# Patient Record
Sex: Female | Born: 1940 | Race: White | Hispanic: No | Smoking: Never smoker
Health system: Southern US, Community
[De-identification: ages and names within clinical notes are randomized; demographics above are authoritative.]

## PROBLEM LIST (undated history)

## (undated) DIAGNOSIS — Z9889 Other specified postprocedural states: Secondary | ICD-10-CM

## (undated) DIAGNOSIS — R52 Pain, unspecified: Secondary | ICD-10-CM

## (undated) DIAGNOSIS — M5417 Radiculopathy, lumbosacral region: Secondary | ICD-10-CM

## (undated) DIAGNOSIS — E039 Hypothyroidism, unspecified: Secondary | ICD-10-CM

## (undated) DIAGNOSIS — E119 Type 2 diabetes mellitus without complications: Secondary | ICD-10-CM

## (undated) DIAGNOSIS — C562 Malignant neoplasm of left ovary: Secondary | ICD-10-CM

## (undated) DIAGNOSIS — I10 Essential (primary) hypertension: Secondary | ICD-10-CM

## (undated) DIAGNOSIS — L509 Urticaria, unspecified: Secondary | ICD-10-CM

## (undated) DIAGNOSIS — M069 Rheumatoid arthritis, unspecified: Secondary | ICD-10-CM

## (undated) DIAGNOSIS — M1990 Unspecified osteoarthritis, unspecified site: Secondary | ICD-10-CM

## (undated) DIAGNOSIS — C569 Malignant neoplasm of unspecified ovary: Secondary | ICD-10-CM

## (undated) DIAGNOSIS — E538 Deficiency of other specified B group vitamins: Secondary | ICD-10-CM

## (undated) DIAGNOSIS — K219 Gastro-esophageal reflux disease without esophagitis: Secondary | ICD-10-CM

## (undated) DIAGNOSIS — J189 Pneumonia, unspecified organism: Secondary | ICD-10-CM

## (undated) DIAGNOSIS — R112 Nausea with vomiting, unspecified: Secondary | ICD-10-CM

## (undated) HISTORY — DX: Malignant neoplasm of unspecified ovary: C56.9

## (undated) HISTORY — DX: Urticaria, unspecified: L50.9

## (undated) HISTORY — PX: EYE SURGERY: SHX253

## (undated) HISTORY — PX: BREAST SURGERY: SHX581

## (undated) HISTORY — PX: KNEE ARTHROSCOPY: SUR90

## (undated) HISTORY — DX: Radiculopathy, lumbosacral region: M54.17

## (undated) HISTORY — DX: Malignant neoplasm of left ovary: C56.2

## (undated) HISTORY — DX: Pain, unspecified: R52

---

## 1982-10-16 HISTORY — PX: VAGINAL HYSTERECTOMY: SHX2639

## 1986-10-16 HISTORY — PX: CHOLECYSTECTOMY: SHX55

## 2000-10-16 HISTORY — PX: BLADDER SUSPENSION: SHX72

## 2003-10-17 HISTORY — PX: ROTATOR CUFF REPAIR: SHX139

## 2004-08-17 ENCOUNTER — Observation Stay (HOSPITAL_COMMUNITY): Admission: RE | Admit: 2004-08-17 | Discharge: 2004-08-18 | Payer: Self-pay | Admitting: Orthopedic Surgery

## 2008-06-23 ENCOUNTER — Ambulatory Visit (HOSPITAL_BASED_OUTPATIENT_CLINIC_OR_DEPARTMENT_OTHER): Admission: RE | Admit: 2008-06-23 | Discharge: 2008-06-23 | Payer: Self-pay | Admitting: Orthopedic Surgery

## 2008-10-16 HISTORY — PX: PARATHYROIDECTOMY: SHX19

## 2009-07-02 ENCOUNTER — Ambulatory Visit (HOSPITAL_BASED_OUTPATIENT_CLINIC_OR_DEPARTMENT_OTHER): Admission: RE | Admit: 2009-07-02 | Discharge: 2009-07-02 | Payer: Self-pay | Admitting: Orthopedic Surgery

## 2011-01-20 LAB — BASIC METABOLIC PANEL
BUN: 17 mg/dL (ref 6–23)
CO2: 30 mEq/L (ref 19–32)
Calcium: 9.5 mg/dL (ref 8.4–10.5)
Chloride: 104 mEq/L (ref 96–112)
Creatinine, Ser: 0.59 mg/dL (ref 0.4–1.2)
GFR calc Af Amer: >60 mL/min (ref >60)
GFR calc non Af Amer: >60 mL/min (ref >60)
Glucose, Bld: 123 mg/dL — ABNORMAL HIGH (ref 70–99)
Potassium: 4.7 mEq/L (ref 3.5–5.1)
Sodium: 141 mEq/L (ref 135–145)

## 2011-01-20 LAB — GLUCOSE, CAPILLARY
Glucose-Capillary: 100 mg/dL — ABNORMAL HIGH (ref 70–99)
Glucose-Capillary: 126 mg/dL — ABNORMAL HIGH (ref 70–99)

## 2011-01-20 LAB — POCT HEMOGLOBIN-HEMACUE: Hemoglobin: 14 g/dL (ref 12.0–15.0)

## 2011-02-28 NOTE — Op Note (Signed)
NAMEVikki Park, [name]                  ACCOUNT NO.:  [id number]   MEDICAL RECORD NO.:  [id number]          PATIENT TYPE:  AMB   LOCATION:  DSC                          FACILITY:  [facility]   PHYSICIAN:  Cindee Salt, M.D.       DATE OF BIRTH:  12-26-1940   DATE OF PROCEDURE:  06/23/2008  DATE OF DISCHARGE:                               OPERATIVE REPORT   PREOPERATIVE DIAGNOSIS:  Carpal tunnel syndrome, right hand.   POSTOPERATIVE DIAGNOSIS:  Carpal tunnel syndrome, right hand.   OPERATION:  Decompression, right median nerve.   SURGEON:  Cindee Salt, MD   ASSISTANT:  Joaquin Courts, RN   ANESTHESIA:  Forearm based IV regional.   ANESTHESIOLOGIST:  Guadalupe Maple, MD   HISTORY:  The patient is a 70 year old female with a history of carpal  tunnel syndrome, EMG nerve conduction is positive.  This has not  responded to conservative treatment.  She has elected to undergo  surgical decompression.  Pre, peri, and postoperative course was  discussed along with risks and complications.  She is aware that there  is no guarantee with the surgery; possibility of infection; recurrence  of injury to arteries, nerves, tendons, and incomplete relief of  symptoms; and dystrophy.  In the preoperative area, the patient is seen.  The extremity marked by both the patient and surgeon.  Antibiotic given.   PROCEDURE:  The patient was brought to the operating room where a  forearm based IV regional anesthetic was carried out without difficulty  under the direction of Dr. Noreene Larsson.  She was prepped using DuraPrep,  supine position.  A time-out was taken.  Following this, a longitudinal  incision was made in the right palm and carried down through  subcutaneous tissue.  Bleeders were electrocauterized.  Palmar fascia  was split.  Superficial palmar arch identified.  The flexor tendon to  the ring and little finger identified to the ulnar side of median nerve,  and the carpal retinaculum was incised with sharp  dissection.  A right-  angle and Sewall retractor were placed between the skin and forearm  fascia.  The fascia was released for approximately a centimeter and half  proximal to the wrist crease under direct vision.  Canal was explored.  The area of compression to the nerve was apparent.  No further lesions  were identified.  Tenosynovial tissue was moderately thickened.  The  wound was irrigated.  The skin was closed with interrupted 5-0 Vicryl  Rapide sutures.  A sterile compressive dressing and wrist splint was  applied.  The patient tolerated the procedure well, was taken to  recovery room for observation in satisfactory condition.  She will be  discharged to home to return to the [facility] [place] in 1 week,  on Vicodin.          ______________________________  Cindee Salt, M.D.    GK/MEDQ  D:  06/23/2008  T:  06/23/2008  Job:  [id number]

## 2011-03-03 NOTE — Op Note (Signed)
NAME:  Park, Amy                  ACCOUNT NO.:  [id number]   MEDICAL RECORD NO.:  [id number]          PATIENT TYPE:  AMB   LOCATION:  DAY                          FACILITY:  [facility]   PHYSICIAN:  Ronald A. Gioffre, M.D.DATE OF BIRTH:  1941-04-07   DATE OF PROCEDURE:  08/17/2004  DATE OF DISCHARGE:                                 OPERATIVE REPORT   SURGEON:  Georges Lynch. Darrelyn Hillock, M.D.   ASSISTANT:  Ebbie Ridge. Paitsel, P.A.   PREOPERATIVE DIAGNOSES:  1.  A tear of the rotator cuff tendon right shoulder.  2.  Severe impingement syndrome right shoulder.   POSTOPERATIVE DIAGNOSES:  1.  A tear of the rotator cuff tendon right shoulder.  2.  Severe impingement syndrome right shoulder.   OPERATIONS:  1.  Open decompression of the right shoulder by performing a partial      acromionectomy and acromioplasty.  2.  Repair of a small hole in the rotator cuff tendon with primary suture.   PROCEDURE:  Under general anesthesia, prior to the general anesthesia, the  patient had an interscalene nerve block on the right.  She was given 500 mg  of vancomycin IV preop.  Sterile prepping and draping of the right shoulder  was carried out.  A small incision was made over the anterior aspect of the  right shoulder.  Bleeders were identified and cauterized.  At this time, I  identified the deltoid muscle and deltoid tendon.  She had a markedly  thickened deltoid muscle proximally.  We incised a proximal portion of the  muscle and detached the tendon from the acromion in the usual fashion.  We  then did a subdeltoid bursectomy.  She had a markedly swollen inflamed  bursa.  We removed that.  We noted she had a small hole from the rotator  cuff.  The major problem that she had here was a large widening and  overgrowth of her acromion.  I protected the rotator cuff with the Bennett  retractor, utilized the oscillating saw, did a partial acromionectomy.  We  then utilized the bur to even the undersurface of the  acromion out.  We now  established a nice space for her as far as her subacromial space.  We then  went down and repaired the rotator cuff tendon with a few Ethibond sutures.  We thoroughly irrigated out the area, and then we inserted some  thrombin-soaked Gelfoam for hemostasis purposes, and then reapproximated the  deltoid tendon and muscle in the usual fashion.  The subcutaneous was closed  with 0 Vicryl, skin with metal staples.  A sterile Neosporin dressing was  applied.  She was placed in a shoulder immobilizer.      RAG/MEDQ  D:  08/17/2004  T:  08/17/2004  Job:  [id number]   cc:   Windy Fast A. Darrelyn Hillock, M.D.  [facility]  [address]  [address]  [place]  [place] [address]  Fax: [phone]

## 2011-07-19 LAB — BASIC METABOLIC PANEL
BUN: 9
CO2: 27
Calcium: 9.6
Chloride: 105
Creatinine, Ser: 0.53
GFR calc Af Amer: >60
GFR calc non Af Amer: >60
Glucose, Bld: 108 — ABNORMAL HIGH
Potassium: 4.3
Sodium: 138

## 2011-07-19 LAB — GLUCOSE, CAPILLARY
Glucose-Capillary: 135 — ABNORMAL HIGH
Glucose-Capillary: 147 — ABNORMAL HIGH

## 2011-07-19 LAB — POCT HEMOGLOBIN-HEMACUE: Hemoglobin: 12.2

## 2014-11-18 ENCOUNTER — Other Ambulatory Visit: Payer: Self-pay | Admitting: Surgical

## 2014-12-01 ENCOUNTER — Encounter (HOSPITAL_COMMUNITY): Payer: Self-pay

## 2014-12-01 ENCOUNTER — Other Ambulatory Visit: Payer: Self-pay

## 2014-12-01 ENCOUNTER — Other Ambulatory Visit (HOSPITAL_COMMUNITY): Payer: Self-pay | Admitting: *Deleted

## 2014-12-01 ENCOUNTER — Encounter (HOSPITAL_COMMUNITY)
Admission: RE | Admit: 2014-12-01 | Discharge: 2014-12-01 | Disposition: A | Discharge status: Home / Self Care | Payer: PPO | Source: Ambulatory Visit | Attending: Orthopedic Surgery | Admitting: Orthopedic Surgery

## 2014-12-01 ENCOUNTER — Ambulatory Visit (HOSPITAL_COMMUNITY)
Admission: RE | Admit: 2014-12-01 | Discharge: 2014-12-01 | Disposition: A | Discharge status: Home / Self Care | Payer: PPO | Source: Ambulatory Visit | Attending: Surgical | Admitting: Surgical

## 2014-12-01 DIAGNOSIS — J9811 Atelectasis: Secondary | ICD-10-CM | POA: Insufficient documentation

## 2014-12-01 DIAGNOSIS — Z01818 Encounter for other preprocedural examination: Secondary | ICD-10-CM | POA: Insufficient documentation

## 2014-12-01 DIAGNOSIS — Z01810 Encounter for preprocedural cardiovascular examination: Secondary | ICD-10-CM | POA: Diagnosis not present

## 2014-12-01 DIAGNOSIS — I10 Essential (primary) hypertension: Secondary | ICD-10-CM | POA: Diagnosis not present

## 2014-12-01 DIAGNOSIS — Z01812 Encounter for preprocedural laboratory examination: Secondary | ICD-10-CM | POA: Diagnosis not present

## 2014-12-01 HISTORY — DX: Essential (primary) hypertension: I10

## 2014-12-01 HISTORY — DX: Nausea with vomiting, unspecified: R11.2

## 2014-12-01 HISTORY — DX: Hypothyroidism, unspecified: E03.9

## 2014-12-01 HISTORY — DX: Other specified postprocedural states: Z98.890

## 2014-12-01 HISTORY — DX: Deficiency of other specified B group vitamins: E53.8

## 2014-12-01 HISTORY — DX: Gastro-esophageal reflux disease without esophagitis: K21.9

## 2014-12-01 HISTORY — DX: Unspecified osteoarthritis, unspecified site: M19.90

## 2014-12-01 HISTORY — DX: Type 2 diabetes mellitus without complications: E11.9

## 2014-12-01 LAB — URINALYSIS, ROUTINE W REFLEX MICROSCOPIC
Bilirubin Urine: NEGATIVE
Glucose, UA: NEGATIVE mg/dL
Hgb urine dipstick: NEGATIVE
Ketones, ur: NEGATIVE mg/dL
Leukocytes, UA: NEGATIVE
Nitrite: NEGATIVE
Protein, ur: NEGATIVE mg/dL
Specific Gravity, Urine: 1.012 (ref 1.005–1.030)
Urobilinogen, UA: 0.2 mg/dL (ref 0.0–1.0)
pH: 5 (ref 5.0–8.0)

## 2014-12-01 LAB — COMPREHENSIVE METABOLIC PANEL
ALT: 26 U/L (ref 0–35)
AST: 23 U/L (ref 0–37)
Albumin: 4.3 g/dL (ref 3.5–5.2)
Alkaline Phosphatase: 43 U/L (ref 39–117)
Anion gap: 6 (ref 5–15)
BUN: 17 mg/dL (ref 6–23)
CO2: 29 mmol/L (ref 19–32)
Calcium: 9.6 mg/dL (ref 8.4–10.5)
Chloride: 106 mmol/L (ref 96–112)
Creatinine, Ser: 0.73 mg/dL (ref 0.50–1.10)
GFR calc Af Amer: >90 mL/min (ref >90)
GFR calc non Af Amer: 83 mL/min — ABNORMAL LOW (ref >90)
Glucose, Bld: 103 mg/dL — ABNORMAL HIGH (ref 70–99)
Potassium: 4.4 mmol/L (ref 3.5–5.1)
Sodium: 141 mmol/L (ref 135–145)
Total Bilirubin: 0.5 mg/dL (ref 0.3–1.2)
Total Protein: 7.2 g/dL (ref 6.0–8.3)

## 2014-12-01 LAB — CBC WITH DIFFERENTIAL/PLATELET
Basophils Absolute: 0 10*3/uL (ref 0.0–0.1)
Basophils Relative: 1 % (ref 0–1)
Eosinophils Absolute: 0.3 10*3/uL (ref 0.0–0.7)
Eosinophils Relative: 3 % (ref 0–5)
HCT: 41.6 % (ref 36.0–46.0)
Hemoglobin: 13.2 g/dL (ref 12.0–15.0)
Lymphocytes Relative: 28 % (ref 12–46)
Lymphs Abs: 2.5 10*3/uL (ref 0.7–4.0)
MCH: 28.7 pg (ref 26.0–34.0)
MCHC: 31.7 g/dL (ref 30.0–36.0)
MCV: 90.4 fL (ref 78.0–100.0)
Monocytes Absolute: 0.5 10*3/uL (ref 0.1–1.0)
Monocytes Relative: 6 % (ref 3–12)
Neutro Abs: 5.5 10*3/uL (ref 1.7–7.7)
Neutrophils Relative %: 62 % (ref 43–77)
Platelets: 222 10*3/uL (ref 150–400)
RBC: 4.6 MIL/uL (ref 3.87–5.11)
RDW: 15.3 % (ref 11.5–15.5)
WBC: 8.7 10*3/uL (ref 4.0–10.5)

## 2014-12-01 LAB — SURGICAL PCR SCREEN
MRSA, PCR: NEGATIVE
Staphylococcus aureus: NEGATIVE

## 2014-12-01 LAB — APTT: aPTT: 29 seconds (ref 24–37)

## 2014-12-01 LAB — PROTIME-INR
INR: 0.98 (ref 0.00–1.49)
Prothrombin Time: 13.1 seconds (ref 11.6–15.2)

## 2014-12-01 NOTE — Patient Instructions (Addendum)
[name]  12/01/2014   Your procedure is scheduled on: 12/08/14   Report to [facility] Main  Entrance and follow signs to               [place] at 5:30 AM.   Call this number if you have problems the morning of surgery [phone]   Remember:  Do not eat food or drink liquids :After Midnight.     Take these medicines the morning of surgery with A SIP OF WATER: AMLODIPINE / LEVOTHYROXINE / OMEPRAZOLE / PROZAC                               You may not have any metal on your body including hair pins and              piercings  Do not wear jewelry, make-up, lotions, powders or perfumes.             Do not wear nail polish.  Do not shave  48 hours prior to surgery.              Men may shave face and neck.   Do not bring valuables to the hospital. [name].  Contacts, dentures or bridgework may not be worn into surgery.  Leave suitcase in the car. After surgery it may be brought to your room.     Patients discharged the day of surgery will not be allowed to drive home.  Name and phone number of your driver:  Special Instructions: N/A              Please read over the following fact sheets you were given: _____________________________________________________________________                                                     [name]  Before surgery, you can play an important role.  Because skin is not sterile, your skin needs to be as free of germs as possible.  You can reduce the number of germs on your skin by washing with CHG (chlorahexidine gluconate) soap before surgery.  CHG is an antiseptic cleaner which kills germs and bonds with the skin to continue killing germs even after washing. Please DO NOT use if you have an allergy to CHG or antibacterial soaps.  If your skin becomes reddened/irritated stop using the CHG and inform your nurse when you arrive at [facility]. Do not shave (including legs and underarms) for at least 48 hours prior to the first CHG shower.  You may shave your face. Please follow these instructions carefully:   1.  Shower with CHG Soap the night before surgery and the  morning of Surgery.   2.  If you choose to wash your hair, wash your hair first as usual with your  normal  Shampoo.   3.  After you shampoo, rinse your hair and body thoroughly to remove the  shampoo.  4.  Use CHG as you would any other liquid soap.  You can apply chg directly  to the skin and wash . Gently wash with scrungie or clean wascloth    5.  Apply the CHG Soap to your body ONLY FROM THE NECK DOWN.   Do not use on open                           Wound or open sores. Avoid contact with eyes, ears mouth and genitals (private parts).                        Genitals (private parts) with your normal soap.              6.  Wash thoroughly, paying special attention to the area where your surgery  will be performed.   7.  Thoroughly rinse your body with warm water from the neck down.   8.  DO NOT shower/wash with your normal soap after using and rinsing off  the CHG Soap .                9.  Pat yourself dry with a clean towel.             10.  Wear clean pajamas.             11.  Place clean sheets on your bed the night of your first shower and do not  sleep with pets.  Day of Surgery : Do not apply any lotions/deodorants the morning of surgery.  Please wear clean clothes to the hospital/surgery center.  FAILURE TO FOLLOW THESE INSTRUCTIONS MAY RESULT IN THE CANCELLATION OF YOUR SURGERY    PATIENT SIGNATURE_________________________________  ______________________________________________________________________     [name]  An incentive spirometer is a tool that can help keep your lungs clear and active. This tool measures how well you are filling your lungs with each breath. Taking long  deep breaths may help reverse or decrease the chance of developing breathing (pulmonary) problems (especially infection) following:  A long period of time when you are unable to move or be active. BEFORE THE PROCEDURE   If the spirometer includes an indicator to show your best effort, your nurse or respiratory therapist will set it to a desired goal.  If possible, sit up straight or lean slightly forward. Try not to slouch.  Hold the incentive spirometer in an upright position. INSTRUCTIONS FOR USE   Sit on the edge of your bed if possible, or sit up as far as you can in bed or on a chair.  Hold the incentive spirometer in an upright position.  Breathe out normally.  Place the mouthpiece in your mouth and seal your lips tightly around it.  Breathe in slowly and as deeply as possible, raising the piston or the ball toward the top of the column.  Hold your breath for 3-5 seconds or for as long as possible. Allow the piston or ball to fall to the bottom of the column.  Remove the mouthpiece from your mouth and breathe out normally.  Rest for a few seconds and repeat Steps 1 through 7 at least 10 times every 1-2 hours when you are awake. Take your time and take a few normal breaths between deep breaths.  The spirometer may include an indicator to show your best effort. Use the indicator as a goal to work toward during  each repetition.  After each set of 10 deep breaths, practice coughing to be sure your lungs are clear. If you have an incision (the cut made at the time of surgery), support your incision when coughing by placing a pillow or rolled up towels firmly against it. Once you are able to get out of bed, walk around indoors and cough well. You may stop using the incentive spirometer when instructed by your caregiver.  RISKS AND COMPLICATIONS  Take your time so you do not get dizzy or light-headed.  If you are in pain, you may need to take or ask for pain medication before doing  incentive spirometry. It is harder to take a deep breath if you are having pain. AFTER USE  Rest and breathe slowly and easily.  It can be helpful to keep track of a log of your progress. Your caregiver can provide you with a simple table to help with this. If you are using the spirometer at home, follow these instructions: [name] IF:   You are having difficultly using the spirometer.  You have trouble using the spirometer as often as instructed.  Your pain medication is not giving enough relief while using the spirometer.  You develop fever of 100.5 F (38.1 C) or higher. SEEK IMMEDIATE MEDICAL CARE IF:   You cough up bloody sputum that had not been present before.  You develop fever of 102 F (38.9 C) or greater.  You develop worsening pain at or near the incision site. MAKE SURE YOU:   Understand these instructions.  Will watch your condition.  Will get help right away if you are not doing well or get worse. Document Released: 02/12/2007 Document Revised: 12/25/2011 Document Reviewed: 04/15/2007 ExitCare Patient Information 2014 ExitCare, [place].   ________________________________________________________________________  WHAT IS A BLOOD TRANSFUSION? Blood Transfusion Information  A transfusion is the replacement of blood or some of its parts. Blood is made up of multiple cells which provide different functions.  Red blood cells carry oxygen and are used for blood loss replacement.  White blood cells fight against infection.  Platelets control bleeding.  Plasma helps clot blood.  Other blood products are available for specialized needs, such as hemophilia or other clotting disorders. BEFORE THE TRANSFUSION  Who gives blood for transfusions?   Healthy volunteers who are fully evaluated to make sure their blood is safe. This is blood bank blood. Transfusion therapy is the safest it has ever been in the practice of medicine. Before blood is taken from a  donor, a complete history is taken to make sure that person has no history of diseases nor engages in risky social behavior (examples are intravenous drug use or sexual activity with multiple partners). The donor's travel history is screened to minimize risk of transmitting infections, such as malaria. The donated blood is tested for signs of infectious diseases, such as HIV and hepatitis. The blood is then tested to be sure it is compatible with you in order to minimize the chance of a transfusion reaction. If you or a relative donates blood, this is often done in anticipation of surgery and is not appropriate for emergency situations. It takes many days to process the donated blood. RISKS AND COMPLICATIONS Although transfusion therapy is very safe and saves many lives, the main dangers of transfusion include:   Getting an infectious disease.  Developing a transfusion reaction. This is an allergic reaction to something in the blood you were given. Every precaution is taken to prevent  this. The decision to have a blood transfusion has been considered carefully by your caregiver before blood is given. Blood is not given unless the benefits outweigh the risks. AFTER THE TRANSFUSION  Right after receiving a blood transfusion, you will usually feel much better and more energetic. This is especially true if your red blood cells have gotten low (anemic). The transfusion raises the level of the red blood cells which carry oxygen, and this usually causes an energy increase.  The nurse administering the transfusion will monitor you carefully for complications. HOME CARE INSTRUCTIONS  No special instructions are needed after a transfusion. You may find your energy is better. Speak with your caregiver about any limitations on activity for underlying diseases you may have. SEEK MEDICAL CARE IF:   Your condition is not improving after your transfusion.  You develop redness or irritation at the intravenous (IV)  site. SEEK IMMEDIATE MEDICAL CARE IF:  Any of the following symptoms occur over the next 12 hours:  Shaking chills.  You have a temperature by mouth above 102 F (38.9 C), not controlled by medicine.  Chest, back, or muscle pain.  People around you feel you are not acting correctly or are confused.  Shortness of breath or difficulty breathing.  Dizziness and fainting.  You get a rash or develop hives.  You have a decrease in urine output.  Your urine turns a dark color or changes to pink, red, or brown. Any of the following symptoms occur over the next 10 days:  You have a temperature by mouth above 102 F (38.9 C), not controlled by medicine.  Shortness of breath.  Weakness after normal activity.  The white part of the eye turns yellow (jaundice).  You have a decrease in the amount of urine or are urinating less often.  Your urine turns a dark color or changes to pink, red, or brown. Document Released: 09/29/2000 Document Revised: 12/25/2011 Document Reviewed: 05/18/2008 [facility] Patient Information 2014 [place], [place].  _______________________________________________________________________

## 2014-12-02 NOTE — H&P (Signed)
TOTAL KNEE ADMISSION H&P  Patient is being admitted for left total knee arthroplasty.  Subjective:  Chief Complaint:left knee pain.  HPI: [name], 74 y.o. female, has a history of pain and functional disability in the left knee due to arthritis and has failed non-surgical conservative treatments for greater than 12 weeks to includeNSAID's and/or analgesics, corticosteriod injections, viscosupplementation injections, flexibility and strengthening excercises and activity modification.  Onset of symptoms was gradual, starting 3 years ago with gradually worsening course since that time. The patient noted prior procedures on the knee to include  arthroscopy and menisectomy on the left knee(s).  Patient currently rates pain in the left knee(s) at 7 out of 10 with activity. Patient has night pain, worsening of pain with activity and weight bearing, pain that interferes with activities of daily living, pain with passive range of motion, crepitus and joint swelling.  Patient has evidence of periarticular osteophytes and joint space narrowing by imaging studies. There is no active infection.   Past Medical History  Diagnosis Date  . Complication of anesthesia   . PONV (postoperative nausea and vomiting)   . Hypertension   . Arthritis   . GERD (gastroesophageal reflux disease)   . Vitamin B 12 deficiency   . Diabetes mellitus without complication   . Hypothyroidism   . Difficulty sleeping     Past Surgical History  Procedure Laterality Date  . Breast surgery      several benign cysts removed  . Abdominal hysterectomy    . Rotator cuff repair    . Parathyroidectomy    . Cholecystectomy    . Bladder suspension    . Knee arthroscopy      l knee x2  . Eye surgery      cataracts removed     Current outpatient prescriptions:  .  ALPRAZolam (XANAX) 0.5 MG tablet, Take 0.25-0.5 mg by mouth 2 (two) times daily as needed for anxiety or sleep. , Disp: , Rfl: 5 .  amLODipine (NORVASC) 5 MG  tablet, Take 5 mg by mouth every morning., Disp: , Rfl:  .  atorvastatin (LIPITOR) 20 MG tablet, Take 20 mg by mouth daily., Disp: , Rfl:  .  FLUoxetine (PROZAC) 20 MG capsule, Take 20 mg by mouth every morning., Disp: , Rfl:  .  hydrochlorothiazide (HYDRODIURIL) 25 MG tablet, Take 25 mg by mouth every morning., Disp: , Rfl:  .  levothyroxine (SYNTHROID, LEVOTHROID) 112 MCG tablet, Take 112 mcg by mouth daily before breakfast., Disp: , Rfl:  .  Liraglutide (VICTOZA [place]), Inject 1.2 mg into the skin daily., Disp: , Rfl:  .  losartan (COZAAR) 100 MG tablet, Take 100 mg by mouth every morning., Disp: , Rfl:  .  metFORMIN (GLUCOPHAGE-XR) 500 MG 24 hr tablet, Take 1,000 mg by mouth 2 (two) times daily., Disp: , Rfl:  .  omeprazole (PRILOSEC OTC) 20 MG tablet, Take 20 mg by mouth daily., Disp: , Rfl:   Allergies  Allergen Reactions  . Penicillins Anaphylaxis    History  Substance Use Topics  . Smoking status: Never Smoker   . Smokeless tobacco: No  . Alcohol Use: No      Review of Systems  Constitutional: Negative.   HENT: Negative.   Eyes: Negative.   Respiratory: Positive for shortness of breath. Negative for cough, hemoptysis, sputum production and wheezing.        SOB with exertion  Cardiovascular: Negative.   Gastrointestinal: Positive for heartburn. Negative for nausea, vomiting, abdominal pain, diarrhea,  constipation, blood in stool and melena.  Genitourinary: Negative.   Musculoskeletal: Positive for joint pain. Negative for myalgias, back pain, falls and neck pain.       Left knee pain  Skin: Negative.   Neurological: Negative.   Endo/Heme/Allergies: Negative.   Psychiatric/Behavioral: Negative for depression, suicidal ideas, hallucinations, memory loss and substance abuse. The patient is nervous/anxious. The patient does not have insomnia.     Objective:  Physical Exam  Constitutional: She is oriented to person, place, and time. She appears well-developed. No distress.   Obese  HENT:  Head: Normocephalic and atraumatic.  Right Ear: External ear normal.  Left Ear: External ear normal.  Nose: Nose normal.  Mouth/Throat: Oropharynx is clear and moist.  Eyes: Conjunctivae and EOM are normal.  Neck: Normal range of motion. Neck supple.  Cardiovascular: Normal rate, regular rhythm, normal heart sounds and intact distal pulses.   No murmur heard. Respiratory: Effort normal and breath sounds normal. No respiratory distress. She has no wheezes.  GI: Soft. Bowel sounds are normal. She exhibits no distension. There is no tenderness.  Musculoskeletal:       Right hip: Normal.       Left hip: Normal.       Right knee: Normal.       Left knee: She exhibits decreased range of motion and swelling. She exhibits no effusion and no erythema. Tenderness found. Medial joint line and lateral joint line tenderness noted.       Right lower leg: She exhibits no tenderness and no swelling.       Left lower leg: She exhibits no tenderness and no swelling.  Neurological: She is alert and oriented to person, place, and time. She has normal strength and normal reflexes. No sensory deficit.  Skin: No rash noted. She is not diaphoretic. No erythema.  Psychiatric: She has a normal mood and affect. Her behavior is normal.    Vitals  Weight: 196 lb Height: 65in Body Surface Area: 1.96 m Body Mass Index: 32.62 kg/m  Pulse: 76 (Regular)  BP: 138/78 (Sitting, Left Arm, Standard)  Imaging Review Plain radiographs demonstrate severe degenerative joint disease of the left knee(s). The overall alignment ismild varus. The bone quality appears to be good for age and reported activity level.  Assessment/Plan:  End stage primary osteoarthritis, left knee   The patient history, physical examination, clinical judgment of the provider and imaging studies are consistent with end stage degenerative joint disease of the left knee(s) and total knee arthroplasty is deemed medically  necessary. The treatment options including medical management, injection therapy arthroscopy and arthroplasty were discussed at length. The risks and benefits of total knee arthroplasty were presented and reviewed. The risks due to aseptic loosening, infection, stiffness, patella tracking problems, thromboembolic complications and other imponderables were discussed. The patient acknowledged the explanation, agreed to proceed with the plan and consent was signed. Patient is being admitted for inpatient treatment for surgery, pain control, PT, OT, prophylactic antibiotics, VTE prophylaxis, progressive ambulation and ADL's and discharge planning. The patient is planning to be discharged to skilled nursing facility ([facility])  PCP: Gilford Rile  TXA IV   Ardeen Jourdain, PA-C

## 2014-12-08 ENCOUNTER — Inpatient Hospital Stay (HOSPITAL_COMMUNITY)
Admission: RE | Admit: 2014-12-08 | Discharge: 2014-12-10 | DRG: 470 | Disposition: A | Discharge status: Skilled Nursing Facility | Payer: PPO | Source: Ambulatory Visit | Attending: Orthopedic Surgery | Admitting: Orthopedic Surgery

## 2014-12-08 ENCOUNTER — Encounter (HOSPITAL_COMMUNITY)
Admission: RE | Disposition: A | Discharge status: Skilled Nursing Facility | Payer: Self-pay | Source: Ambulatory Visit | Attending: Orthopedic Surgery

## 2014-12-08 ENCOUNTER — Other Ambulatory Visit: Payer: Self-pay | Admitting: Orthopedic Surgery

## 2014-12-08 ENCOUNTER — Inpatient Hospital Stay (HOSPITAL_COMMUNITY): Payer: PPO | Admitting: Anesthesiology

## 2014-12-08 ENCOUNTER — Encounter (HOSPITAL_COMMUNITY): Payer: Self-pay | Admitting: *Deleted

## 2014-12-08 DIAGNOSIS — G479 Sleep disorder, unspecified: Secondary | ICD-10-CM | POA: Diagnosis present

## 2014-12-08 DIAGNOSIS — Z96659 Presence of unspecified artificial knee joint: Secondary | ICD-10-CM

## 2014-12-08 DIAGNOSIS — Z880 Allergy status to penicillin: Secondary | ICD-10-CM | POA: Diagnosis not present

## 2014-12-08 DIAGNOSIS — E538 Deficiency of other specified B group vitamins: Secondary | ICD-10-CM | POA: Diagnosis present

## 2014-12-08 DIAGNOSIS — Z90710 Acquired absence of both cervix and uterus: Secondary | ICD-10-CM

## 2014-12-08 DIAGNOSIS — K219 Gastro-esophageal reflux disease without esophagitis: Secondary | ICD-10-CM | POA: Diagnosis present

## 2014-12-08 DIAGNOSIS — E039 Hypothyroidism, unspecified: Secondary | ICD-10-CM | POA: Diagnosis present

## 2014-12-08 DIAGNOSIS — E119 Type 2 diabetes mellitus without complications: Secondary | ICD-10-CM | POA: Diagnosis present

## 2014-12-08 DIAGNOSIS — M1712 Unilateral primary osteoarthritis, left knee: Principal | ICD-10-CM | POA: Diagnosis present

## 2014-12-08 DIAGNOSIS — D62 Acute posthemorrhagic anemia: Secondary | ICD-10-CM | POA: Diagnosis not present

## 2014-12-08 DIAGNOSIS — I10 Essential (primary) hypertension: Secondary | ICD-10-CM | POA: Diagnosis present

## 2014-12-08 DIAGNOSIS — Z96652 Presence of left artificial knee joint: Secondary | ICD-10-CM

## 2014-12-08 HISTORY — DX: Presence of unspecified artificial knee joint: Z96.659

## 2014-12-08 HISTORY — PX: TOTAL KNEE ARTHROPLASTY: SHX125

## 2014-12-08 LAB — TYPE AND SCREEN
ABO/RH(D): O POS
Antibody Screen: NEGATIVE

## 2014-12-08 LAB — GLUCOSE, CAPILLARY
GLUCOSE-CAPILLARY: 193 mg/dL — AB (ref 70–99)
Glucose-Capillary: 118 mg/dL — ABNORMAL HIGH (ref 70–99)
Glucose-Capillary: 133 mg/dL — ABNORMAL HIGH (ref 70–99)
Glucose-Capillary: 192 mg/dL — ABNORMAL HIGH (ref 70–99)

## 2014-12-08 LAB — ABO/RH: ABO/RH(D): O POS

## 2014-12-08 SURGERY — ARTHROPLASTY, KNEE, TOTAL
Anesthesia: Spinal | Site: Knee | Laterality: Left

## 2014-12-08 MED ORDER — BUPIVACAINE HCL (PF) 0.25 % IJ SOLN
INTRAMUSCULAR | Status: AC
  Filled 2014-12-08: qty 30

## 2014-12-08 MED ORDER — CLINDAMYCIN PHOSPHATE 900 MG/50ML IV SOLN
INTRAVENOUS | Status: AC
  Filled 2014-12-08: qty 50

## 2014-12-08 MED ORDER — DEXAMETHASONE SODIUM PHOSPHATE 10 MG/ML IJ SOLN
INTRAMUSCULAR | Status: DC | PRN
  Administered 2014-12-08: 10 mg via INTRAVENOUS

## 2014-12-08 MED ORDER — PROPOFOL INFUSION 10 MG/ML OPTIME
INTRAVENOUS | Status: DC | PRN
  Administered 2014-12-08: 75 ug/kg/min via INTRAVENOUS

## 2014-12-08 MED ORDER — CLINDAMYCIN PHOSPHATE 600 MG/50ML IV SOLN
600.0000 mg | Freq: Four times a day (QID) | INTRAVENOUS | Status: AC
  Administered 2014-12-08 (×2): 600 mg via INTRAVENOUS
  Filled 2014-12-08 (×2): qty 50

## 2014-12-08 MED ORDER — OXYCODONE-ACETAMINOPHEN 5-325 MG PO TABS
2.0000 | ORAL_TABLET | ORAL | Status: DC | PRN
  Administered 2014-12-08 – 2014-12-10 (×9): 2 via ORAL
  Filled 2014-12-08 (×9): qty 2

## 2014-12-08 MED ORDER — CHLORHEXIDINE GLUCONATE 4 % EX LIQD: 60.0000 mL | Freq: Once | CUTANEOUS | Status: DC

## 2014-12-08 MED ORDER — CLINDAMYCIN PHOSPHATE 900 MG/50ML IV SOLN
900.0000 mg | INTRAVENOUS | Status: AC
  Administered 2014-12-08: 900 mg via INTRAVENOUS

## 2014-12-08 MED ORDER — THROMBIN 5000 UNITS EX SOLR
OROMUCOSAL | Status: DC | PRN
  Administered 2014-12-08: 09:00:00 via TOPICAL

## 2014-12-08 MED ORDER — SODIUM CHLORIDE 0.9 % IR SOLN
Status: AC
  Filled 2014-12-08: qty 1

## 2014-12-08 MED ORDER — FERROUS SULFATE 325 (65 FE) MG PO TABS
325.0000 mg | ORAL_TABLET | Freq: Three times a day (TID) | ORAL | Status: DC
  Administered 2014-12-09 – 2014-12-10 (×5): 325 mg via ORAL
  Filled 2014-12-08 (×7): qty 1

## 2014-12-08 MED ORDER — ALPRAZOLAM 0.25 MG PO TABS
0.2500 mg | ORAL_TABLET | Freq: Two times a day (BID) | ORAL | Status: DC | PRN
  Administered 2014-12-08 – 2014-12-09 (×2): 0.5 mg via ORAL
  Filled 2014-12-08 (×2): qty 2

## 2014-12-08 MED ORDER — SODIUM CHLORIDE 0.9 % IJ SOLN
INTRAMUSCULAR | Status: AC
  Filled 2014-12-08: qty 50

## 2014-12-08 MED ORDER — ACETAMINOPHEN 650 MG RE SUPP: 650.0000 mg | Freq: Four times a day (QID) | RECTAL | Status: DC | PRN

## 2014-12-08 MED ORDER — HYDROMORPHONE HCL 1 MG/ML IJ SOLN
1.0000 mg | INTRAMUSCULAR | Status: DC | PRN
  Administered 2014-12-08: 0.5 mg via INTRAVENOUS
  Filled 2014-12-08: qty 1

## 2014-12-08 MED ORDER — TRANEXAMIC ACID 100 MG/ML IV SOLN
1000.0000 mg | INTRAVENOUS | Status: AC
  Administered 2014-12-08: 1000 mg via INTRAVENOUS
  Filled 2014-12-08: qty 10

## 2014-12-08 MED ORDER — LACTATED RINGERS IV SOLN
INTRAVENOUS | Status: DC
  Administered 2014-12-08: 1000 mL via INTRAVENOUS
  Administered 2014-12-08: 18:00:00 via INTRAVENOUS

## 2014-12-08 MED ORDER — DEXAMETHASONE SODIUM PHOSPHATE 10 MG/ML IJ SOLN
INTRAMUSCULAR | Status: AC
  Filled 2014-12-08: qty 1

## 2014-12-08 MED ORDER — ACETAMINOPHEN 10 MG/ML IV SOLN
1000.0000 mg | Freq: Once | INTRAVENOUS | Status: AC
  Administered 2014-12-08: 1000 mg via INTRAVENOUS
  Filled 2014-12-08: qty 100

## 2014-12-08 MED ORDER — CELECOXIB 200 MG PO CAPS
200.0000 mg | ORAL_CAPSULE | Freq: Two times a day (BID) | ORAL | Status: DC
  Administered 2014-12-08 – 2014-12-10 (×4): 200 mg via ORAL
  Filled 2014-12-08 (×5): qty 1

## 2014-12-08 MED ORDER — ONDANSETRON HCL 4 MG/2ML IJ SOLN
INTRAMUSCULAR | Status: AC
  Filled 2014-12-08: qty 2

## 2014-12-08 MED ORDER — FLUOXETINE HCL 20 MG PO CAPS
20.0000 mg | ORAL_CAPSULE | Freq: Every morning | ORAL | Status: DC
  Administered 2014-12-09 – 2014-12-10 (×2): 20 mg via ORAL
  Filled 2014-12-08 (×2): qty 1

## 2014-12-08 MED ORDER — ATORVASTATIN CALCIUM 20 MG PO TABS
20.0000 mg | ORAL_TABLET | Freq: Every day | ORAL | Status: DC
  Administered 2014-12-08 – 2014-12-10 (×3): 20 mg via ORAL
  Filled 2014-12-08 (×3): qty 1

## 2014-12-08 MED ORDER — METFORMIN HCL ER 500 MG PO TB24
1000.0000 mg | ORAL_TABLET | Freq: Two times a day (BID) | ORAL | Status: DC
  Administered 2014-12-09 – 2014-12-10 (×3): 1000 mg via ORAL
  Filled 2014-12-08 (×5): qty 2

## 2014-12-08 MED ORDER — ALUM & MAG HYDROXIDE-SIMETH 200-200-20 MG/5ML PO SUSP
30.0000 mL | ORAL | Status: DC | PRN
Start: 2014-12-08 — End: 2014-12-10

## 2014-12-08 MED ORDER — RIVAROXABAN 10 MG PO TABS
10.0000 mg | ORAL_TABLET | Freq: Every day | ORAL | Status: DC
  Administered 2014-12-09 – 2014-12-10 (×2): 10 mg via ORAL
  Filled 2014-12-08 (×3): qty 1

## 2014-12-08 MED ORDER — FENTANYL CITRATE 0.05 MG/ML IJ SOLN
INTRAMUSCULAR | Status: AC
  Filled 2014-12-08: qty 5

## 2014-12-08 MED ORDER — PANTOPRAZOLE SODIUM 40 MG PO TBEC
40.0000 mg | DELAYED_RELEASE_TABLET | Freq: Every day | ORAL | Status: DC
  Administered 2014-12-09 – 2014-12-10 (×2): 40 mg via ORAL
  Filled 2014-12-08 (×2): qty 1

## 2014-12-08 MED ORDER — HYDROCODONE-ACETAMINOPHEN 5-325 MG PO TABS
1.0000 | ORAL_TABLET | ORAL | Status: DC | PRN
  Administered 2014-12-08: 2 via ORAL
  Filled 2014-12-08: qty 2

## 2014-12-08 MED ORDER — AMLODIPINE BESYLATE 5 MG PO TABS
5.0000 mg | ORAL_TABLET | Freq: Every morning | ORAL | Status: DC
  Administered 2014-12-09 – 2014-12-10 (×2): 5 mg via ORAL
  Filled 2014-12-08 (×2): qty 1

## 2014-12-08 MED ORDER — METHOCARBAMOL 500 MG PO TABS
500.0000 mg | ORAL_TABLET | Freq: Four times a day (QID) | ORAL | Status: DC | PRN
  Administered 2014-12-09: 500 mg via ORAL
  Filled 2014-12-08: qty 1

## 2014-12-08 MED ORDER — THROMBIN 5000 UNITS EX SOLR
CUTANEOUS | Status: AC
  Filled 2014-12-08: qty 5000

## 2014-12-08 MED ORDER — INSULIN ASPART 100 UNIT/ML ~~LOC~~ SOLN
0.0000 [IU] | Freq: Three times a day (TID) | SUBCUTANEOUS | Status: DC
  Administered 2014-12-08 – 2014-12-09 (×2): 3 [IU] via SUBCUTANEOUS
  Administered 2014-12-09 – 2014-12-10 (×2): 2 [IU] via SUBCUTANEOUS

## 2014-12-08 MED ORDER — PROPOFOL 10 MG/ML IV BOLUS
INTRAVENOUS | Status: AC
  Filled 2014-12-08: qty 20

## 2014-12-08 MED ORDER — METHOCARBAMOL 1000 MG/10ML IJ SOLN
500.0000 mg | Freq: Four times a day (QID) | INTRAMUSCULAR | Status: DC | PRN
  Administered 2014-12-08: 500 mg via INTRAVENOUS
  Filled 2014-12-08 (×2): qty 5

## 2014-12-08 MED ORDER — BUPIVACAINE HCL (PF) 0.25 % IJ SOLN
INTRAMUSCULAR | Status: DC | PRN
  Administered 2014-12-08: 20 mL

## 2014-12-08 MED ORDER — SODIUM CHLORIDE 0.9 % IR SOLN
Status: DC | PRN
  Administered 2014-12-08: 3000 mL

## 2014-12-08 MED ORDER — BISACODYL 5 MG PO TBEC: 5.0000 mg | DELAYED_RELEASE_TABLET | Freq: Every day | ORAL | Status: DC | PRN

## 2014-12-08 MED ORDER — LOSARTAN POTASSIUM 50 MG PO TABS
100.0000 mg | ORAL_TABLET | Freq: Every morning | ORAL | Status: DC
  Administered 2014-12-10: 100 mg via ORAL
  Filled 2014-12-08 (×3): qty 2

## 2014-12-08 MED ORDER — POLYETHYLENE GLYCOL 3350 17 G PO PACK
17.0000 g | PACK | Freq: Every day | ORAL | Status: DC | PRN
  Administered 2014-12-09: 17 g via ORAL
  Filled 2014-12-08: qty 1

## 2014-12-08 MED ORDER — MENTHOL 3 MG MT LOZG
1.0000 | LOZENGE | OROMUCOSAL | Status: DC | PRN
  Filled 2014-12-08: qty 9

## 2014-12-08 MED ORDER — ACETAMINOPHEN 325 MG PO TABS: 650.0000 mg | ORAL_TABLET | Freq: Four times a day (QID) | ORAL | Status: DC | PRN

## 2014-12-08 MED ORDER — ONDANSETRON HCL 4 MG PO TABS: 4.0000 mg | ORAL_TABLET | Freq: Four times a day (QID) | ORAL | Status: DC | PRN

## 2014-12-08 MED ORDER — MIDAZOLAM HCL 5 MG/5ML IJ SOLN
INTRAMUSCULAR | Status: DC | PRN
  Administered 2014-12-08 (×2): 1 mg via INTRAVENOUS

## 2014-12-08 MED ORDER — SODIUM CHLORIDE 0.9 % IJ SOLN
INTRAMUSCULAR | Status: DC | PRN
Start: 2014-12-08 — End: 2014-12-08
  Administered 2014-12-08: 20 mL

## 2014-12-08 MED ORDER — FLEET ENEMA 7-19 GM/118ML RE ENEM: 1.0000 | ENEMA | Freq: Once | RECTAL | Status: AC | PRN

## 2014-12-08 MED ORDER — PHENOL 1.4 % MT LIQD
1.0000 | OROMUCOSAL | Status: DC | PRN
  Filled 2014-12-08: qty 177

## 2014-12-08 MED ORDER — BUPIVACAINE LIPOSOME 1.3 % IJ SUSP
20.0000 mL | Freq: Once | INTRAMUSCULAR | Status: AC
  Administered 2014-12-08: 20 mL
  Filled 2014-12-08: qty 20

## 2014-12-08 MED ORDER — ONDANSETRON HCL 4 MG/2ML IJ SOLN
4.0000 mg | Freq: Four times a day (QID) | INTRAMUSCULAR | Status: DC | PRN
Start: 2014-12-08 — End: 2014-12-10

## 2014-12-08 MED ORDER — ROCURONIUM BROMIDE 100 MG/10ML IV SOLN
INTRAVENOUS | Status: AC
  Filled 2014-12-08: qty 1

## 2014-12-08 MED ORDER — HYDROCHLOROTHIAZIDE 25 MG PO TABS
25.0000 mg | ORAL_TABLET | Freq: Every morning | ORAL | Status: DC
  Administered 2014-12-09 – 2014-12-10 (×2): 25 mg via ORAL
  Filled 2014-12-08 (×2): qty 1

## 2014-12-08 MED ORDER — ONDANSETRON HCL 4 MG/2ML IJ SOLN
INTRAMUSCULAR | Status: DC | PRN
  Administered 2014-12-08: 4 mg via INTRAVENOUS

## 2014-12-08 MED ORDER — LIRAGLUTIDE 18 MG/3ML ~~LOC~~ SOPN: 1.2000 mg | PEN_INJECTOR | Freq: Every day | SUBCUTANEOUS | Status: DC

## 2014-12-08 MED ORDER — LEVOTHYROXINE SODIUM 112 MCG PO TABS
112.0000 ug | ORAL_TABLET | Freq: Every day | ORAL | Status: DC
  Administered 2014-12-09 – 2014-12-10 (×2): 112 ug via ORAL
  Filled 2014-12-08 (×3): qty 1

## 2014-12-08 MED ORDER — BUPIVACAINE IN DEXTROSE 0.75-8.25 % IT SOLN
INTRATHECAL | Status: DC | PRN
  Administered 2014-12-08: 15 mg via INTRATHECAL

## 2014-12-08 MED ORDER — LACTATED RINGERS IV SOLN
INTRAVENOUS | Status: DC
  Administered 2014-12-08 (×2): via INTRAVENOUS

## 2014-12-08 MED ORDER — MIDAZOLAM HCL 2 MG/2ML IJ SOLN
INTRAMUSCULAR | Status: AC
  Filled 2014-12-08: qty 2

## 2014-12-08 MED ORDER — OMEPRAZOLE MAGNESIUM 20 MG PO TBEC: 20.0000 mg | DELAYED_RELEASE_TABLET | Freq: Every day | ORAL | Status: DC

## 2014-12-08 MED ORDER — SODIUM CHLORIDE 0.9 % IR SOLN
Status: DC | PRN
  Administered 2014-12-08: 500 mL

## 2014-12-08 MED ORDER — HYDROMORPHONE HCL 1 MG/ML IJ SOLN: 0.2500 mg | INTRAMUSCULAR | Status: DC | PRN

## 2014-12-08 SURGICAL SUPPLY — 75 items
ANCHOR PEEK ZIP 5.5 NDL NO2 (Orthopedic Implant) ×3 IMPLANT
BAG ZIPLOCK 12X15 (MISCELLANEOUS) IMPLANT
BANDAGE ELASTIC 4 VELCRO ST LF (GAUZE/BANDAGES/DRESSINGS) ×3 IMPLANT
BANDAGE ELASTIC 6 VELCRO ST LF (GAUZE/BANDAGES/DRESSINGS) ×3 IMPLANT
BANDAGE ESMARK 6X9 LF (GAUZE/BANDAGES/DRESSINGS) ×1 IMPLANT
BLADE SAG 18X100X1.27 (BLADE) ×3 IMPLANT
BLADE SAW SGTL 11.0X1.19X90.0M (BLADE) ×3 IMPLANT
BNDG ESMARK 6X9 LF (GAUZE/BANDAGES/DRESSINGS) ×3
BONE CEMENT GENTAMICIN (Cement) ×6 IMPLANT
CAP KNEE TOTAL 3 SIGMA ×3 IMPLANT
CEMENT BONE GENTAMICIN 40 (Cement) ×2 IMPLANT
CUFF TOURN SGL QUICK 34 (TOURNIQUET CUFF) ×2
CUFF TRNQT CYL 34X4X40X1 (TOURNIQUET CUFF) ×1 IMPLANT
DERMABOND ADVANCED (GAUZE/BANDAGES/DRESSINGS) ×2
DERMABOND ADVANCED .7 DNX12 (GAUZE/BANDAGES/DRESSINGS) ×1 IMPLANT
DRAPE EXTREMITY T 121X128X90 (DRAPE) ×3 IMPLANT
DRAPE INCISE IOBAN 66X45 STRL (DRAPES) IMPLANT
DRAPE POUCH INSTRU U-SHP 10X18 (DRAPES) ×3 IMPLANT
DRAPE U-SHAPE 47X51 STRL (DRAPES) ×3 IMPLANT
DRSG AQUACEL AG ADV 3.5X10 (GAUZE/BANDAGES/DRESSINGS) ×3 IMPLANT
DRSG AQUACEL AG ADV 3.5X14 (GAUZE/BANDAGES/DRESSINGS) ×3 IMPLANT
DRSG PAD ABDOMINAL 8X10 ST (GAUZE/BANDAGES/DRESSINGS) IMPLANT
DRSG TEGADERM 4X4.75 (GAUZE/BANDAGES/DRESSINGS) ×3 IMPLANT
DURAPREP 26ML APPLICATOR (WOUND CARE) ×3 IMPLANT
ELECT REM PT RETURN 9FT ADLT (ELECTROSURGICAL) ×3
ELECTRODE REM PT RTRN 9FT ADLT (ELECTROSURGICAL) ×1 IMPLANT
EVACUATOR 1/8 PVC DRAIN (DRAIN) ×3 IMPLANT
FACESHIELD WRAPAROUND (MASK) ×15 IMPLANT
GAUZE SPONGE 2X2 8PLY STRL LF (GAUZE/BANDAGES/DRESSINGS) ×1 IMPLANT
GLOVE BIOGEL PI IND STRL 6.5 (GLOVE) ×1 IMPLANT
GLOVE BIOGEL PI IND STRL 8 (GLOVE) ×1 IMPLANT
GLOVE BIOGEL PI INDICATOR 6.5 (GLOVE) ×2
GLOVE BIOGEL PI INDICATOR 8 (GLOVE) ×2
GLOVE ECLIPSE 8.0 STRL XLNG CF (GLOVE) ×6 IMPLANT
GLOVE SURG SS PI 6.5 STRL IVOR (GLOVE) ×3 IMPLANT
GOWN STRL REUS W/TWL LRG LVL3 (GOWN DISPOSABLE) ×9 IMPLANT
GOWN STRL REUS W/TWL XL LVL3 (GOWN DISPOSABLE) ×6 IMPLANT
HANDPIECE INTERPULSE COAX TIP (DISPOSABLE) ×2
IMMOBILIZER KNEE 20 (SOFTGOODS) ×6 IMPLANT
IMMOBILIZER KNEE 20 THIGH 36 (SOFTGOODS) ×1 IMPLANT
KIT BASIN OR (CUSTOM PROCEDURE TRAY) ×3 IMPLANT
LIQUID BAND (GAUZE/BANDAGES/DRESSINGS) ×3 IMPLANT
MANIFOLD NEPTUNE II (INSTRUMENTS) ×3 IMPLANT
NDL SAFETY ECLIPSE 18X1.5 (NEEDLE) ×1 IMPLANT
NEEDLE HYPO 18GX1.5 SHARP (NEEDLE) ×2
NEEDLE HYPO 22GX1.5 SAFETY (NEEDLE) ×6 IMPLANT
NS IRRIG 1000ML POUR BTL (IV SOLUTION) IMPLANT
PACK TOTAL JOINT (CUSTOM PROCEDURE TRAY) ×3 IMPLANT
PADDING CAST COTTON 6X4 STRL (CAST SUPPLIES) IMPLANT
PEN SKIN MARKING BROAD (MISCELLANEOUS) ×3 IMPLANT
POSITIONER SURGICAL ARM (MISCELLANEOUS) ×3 IMPLANT
SET HNDPC FAN SPRY TIP SCT (DISPOSABLE) ×1 IMPLANT
SET PAD KNEE POSITIONER (MISCELLANEOUS) ×3 IMPLANT
SLEEVE SURGEON STRL (DRAPES) ×3 IMPLANT
SPONGE GAUZE 2X2 STER 10/PKG (GAUZE/BANDAGES/DRESSINGS) ×2
SPONGE LAP 18X18 X RAY DECT (DISPOSABLE) IMPLANT
SPONGE SURGIFOAM ABS GEL 100 (HEMOSTASIS) ×3 IMPLANT
STAPLER VISISTAT 35W (STAPLE) IMPLANT
SUCTION FRAZIER 12FR DISP (SUCTIONS) ×3 IMPLANT
SUT BONE WAX W31G (SUTURE) IMPLANT
SUT MNCRL AB 4-0 PS2 18 (SUTURE) ×3 IMPLANT
SUT VIC AB 1 CT1 27 (SUTURE) ×4
SUT VIC AB 1 CT1 27XBRD ANTBC (SUTURE) ×2 IMPLANT
SUT VIC AB 2-0 CT1 27 (SUTURE) ×6
SUT VIC AB 2-0 CT1 TAPERPNT 27 (SUTURE) ×3 IMPLANT
SUT VLOC 180 0 24IN GS25 (SUTURE) ×3 IMPLANT
SYR 20CC LL (SYRINGE) ×6 IMPLANT
TOWEL OR 17X26 10 PK STRL BLUE (TOWEL DISPOSABLE) ×3 IMPLANT
TOWEL OR NON WOVEN STRL DISP B (DISPOSABLE) IMPLANT
TOWER CARTRIDGE SMART MIX (DISPOSABLE) ×3 IMPLANT
TRAY FOLEY CATH 14FRSI W/METER (CATHETERS) ×3 IMPLANT
WATER STERILE IRR 1500ML POUR (IV SOLUTION) ×3 IMPLANT
WRAP KNEE MAXI GEL POST OP (GAUZE/BANDAGES/DRESSINGS) ×3 IMPLANT
YANKAUER SUCT BULB TIP 10FT TU (MISCELLANEOUS) ×3 IMPLANT
YANKAUER SUCT BULB TIP NO VENT (SUCTIONS) ×3 IMPLANT

## 2014-12-08 NOTE — Anesthesia Postprocedure Evaluation (Signed)
  Anesthesia Post-op Note  Patient: [name]  Procedure(s) Performed: Procedure(s): LEFT TOTAL KNEE ARTHROPLASTY (Left)  Patient Location: PACU  Anesthesia Type: Spinal/MAC  Level of Consciousness: awake and alert   Airway and Oxygen Therapy: Patient Spontanous Breathing  Post-op Pain: none  Post-op Assessment: Post-op Vital signs reviewed, Patient's Cardiovascular Status Stable and Respiratory Function Stable. No residual motor block.  Post-op Vital Signs: Reviewed  Filed Vitals:   12/08/14 1000  BP: 123/52  Pulse: 74  Temp:   Resp: 14    Complications: No apparent anesthesia complications

## 2014-12-08 NOTE — Interval H&P Note (Signed)
History and Physical Interval Note:  12/08/2014 7:11 AM  [name]  has presented today for surgery, with the diagnosis of left knee osteoarthritis  The various methods of treatment have been discussed with the patient and family. After consideration of risks, benefits and other options for treatment, the patient has consented to  Procedure(s): LEFT TOTAL KNEE ARTHROPLASTY (Left) as a surgical intervention .  The patient's history has been reviewed, patient examined, no change in status, stable for surgery.  I have reviewed the patient's chart and labs.  Questions were answered to the patient's satisfaction.     [name] A

## 2014-12-08 NOTE — Anesthesia Preprocedure Evaluation (Addendum)
Anesthesia Evaluation  Patient identified by MRN, date of birth, ID band Patient awake    Reviewed: Allergy & Precautions, H&P , NPO status , Patient's Chart, lab work & pertinent test results  History of Anesthesia Complications (+) PONV  Airway Mallampati: II  TM Distance: >3 FB Neck ROM: Full    Dental no notable dental hx. (+) Teeth Intact, Dental Advisory Given   Pulmonary neg pulmonary ROS,  breath sounds clear to auscultation  Pulmonary exam normal       Cardiovascular hypertension, Pt. on medications Rhythm:Regular Rate:Normal     Neuro/Psych negative neurological ROS  negative psych ROS   GI/Hepatic Neg liver ROS, GERD-  Medicated and Controlled,  Endo/Other  diabetes, Type 2, Oral Hypoglycemic AgentsHypothyroidism Morbid obesity  Renal/GU negative Renal ROS  negative genitourinary   Musculoskeletal  (+) Arthritis -, Osteoarthritis,    Abdominal   Peds  Hematology negative hematology ROS (+)   Anesthesia Other Findings   Reproductive/Obstetrics negative OB ROS                            Anesthesia Physical Anesthesia Plan  ASA: III  Anesthesia Plan: Spinal   Post-op Pain Management:    Induction: Intravenous  Airway Management Planned: Simple Face Mask  Additional Equipment:   Intra-op Plan:   Post-operative Plan:   Informed Consent: I have reviewed the patients History and Physical, chart, labs and discussed the procedure including the risks, benefits and alternatives for the proposed anesthesia with the patient or authorized representative who has indicated his/her understanding and acceptance.   Dental advisory given  Plan Discussed with: CRNA  Anesthesia Plan Comments:         Anesthesia Quick Evaluation

## 2014-12-08 NOTE — Transfer of Care (Signed)
Immediate Anesthesia Transfer of Care Note  Patient: [name]  Procedure(s) Performed: Procedure(s): LEFT TOTAL KNEE ARTHROPLASTY (Left)  Patient Location: PACU  Anesthesia Type:Spinal  Level of Consciousness: awake, alert , oriented and patient cooperative  Airway & Oxygen Therapy: Patient Spontanous Breathing and Patient connected to face mask oxygen  Post-op Assessment: Report given to RN and Post -op Vital signs reviewed and stable  Post vital signs: Reviewed and stable  Last Vitals:  Filed Vitals:   12/08/14 0533  BP: 113/68  Pulse: 82  Temp: 37.1 C  Resp: 18    Complications: No apparent anesthesia complications

## 2014-12-08 NOTE — Evaluation (Signed)
Physical Therapy Evaluation Patient Details Name: Shantella Blubaugh MRN: [id number] DOB: January 22, 1941 Today's Date: 12/08/2014   History of Present Illness  74 yo female s/p L TKA 12/08/14.   Clinical Impression  On eval POD 0, pt required Min assist for mobility-able to ambulate ~15 feet with RW. Pain rated 8/10 so activity limited this day. Recommend ST rehab at SNF for continued rehab.     Follow Up Recommendations SNF    Equipment Recommendations  None recommended by PT    Recommendations for Other Services OT consult     Precautions / Restrictions Precautions Precautions: Fall;Knee Required Braces or Orthoses: Knee Immobilizer - Left Knee Immobilizer - Left: Discontinue once straight leg raise with < 10 degree lag Restrictions Weight Bearing Restrictions: No LLE Weight Bearing: Weight bearing as tolerated      Mobility  Bed Mobility Overal bed mobility: Needs Assistance Bed Mobility: Supine to Sit     Supine to sit: Min assist     General bed mobility comments: Assist for L LE.   Transfers Overall transfer level: Needs assistance Equipment used: Rolling walker (2 wheeled) Transfers: Sit to/from Stand Sit to Stand: Min assist         General transfer comment: Assist to rise, stabilize, control descent. VCs safety, technique, hand placement  Ambulation/Gait Ambulation/Gait assistance: Min assist Ambulation Distance (Feet): 15 Feet Assistive device: Rolling walker (2 wheeled) Gait Pattern/deviations: Step-to pattern;Antalgic;Decreased stance time - left;Decreased stride length     General Gait Details: Assist to stabilize. VCs safety, technique, sequence. Limited distance due to pain 8/10.   Stairs            Wheelchair Mobility    Modified Rankin (Stroke Patients Only)       Balance                                             Pertinent Vitals/Pain Pain Assessment: 0-10 Pain Score: 9  Pain Location: L knee Pain Descriptors /  Indicators: Aching;Sore Pain Intervention(s): Monitored during session    Home Living Family/patient expects to be discharged to:: Skilled nursing facility Living Arrangements: Alone             Home Equipment: Walker - 2 wheels      Prior Function Level of Independence: Independent               Hand Dominance        Extremity/Trunk Assessment   Upper Extremity Assessment: Defer to OT evaluation           Lower Extremity Assessment: LLE deficits/detail   LLE Deficits / Details: hip flex 3-/5, moves ankle well  Cervical / Trunk Assessment: Normal  Communication   Communication: No difficulties  Cognition Arousal/Alertness: Awake/alert Behavior During Therapy: WFL for tasks assessed/performed Overall Cognitive Status: Within Functional Limits for tasks assessed                      General Comments      Exercises        Assessment/Plan    PT Assessment Patient needs continued PT services  PT Diagnosis Difficulty walking;Acute pain   PT Problem List Decreased strength;Decreased range of motion;Decreased activity tolerance;Decreased balance;Decreased mobility;Decreased knowledge of use of DME;Pain;Decreased knowledge of precautions  PT Treatment Interventions DME instruction;Gait training;Functional mobility training;Therapeutic activities;Therapeutic exercise;Balance training;Patient/family education   PT Goals (  Current goals can be found in the Care Plan section) Acute Rehab PT Goals Patient Stated Goal: rehab to continue therapy PT Goal Formulation: With patient Time For Goal Achievement: 12/15/14 Potential to Achieve Goals: Good    Frequency 7X/week   Barriers to discharge        Co-evaluation               End of Session Equipment Utilized During Treatment: Gait belt Activity Tolerance: Patient limited by pain Patient left: in chair;with call bell/phone within reach;with family/visitor present           Time:  1558-1610 PT Time Calculation (min) (ACUTE ONLY): 12 min   Charges:   PT Evaluation $Initial PT Evaluation Tier I: 1 Procedure     PT G Codes:        Weston Anna, MPT Pager: [phone]

## 2014-12-08 NOTE — Plan of Care (Signed)
Problem: Consults Goal: Diagnosis- Total Joint Replacement Left total knee     

## 2014-12-08 NOTE — Brief Op Note (Signed)
12/08/2014  9:10 AM  PATIENT:  Carollee Sires  74 y.o. female  PRE-OPERATIVE DIAGNOSIS:  left knee Primary  osteoarthritis  POST-OPERATIVE DIAGNOSIS:  left knee Primaru osteoarthritis  PROCEDURE:  Procedure(s): LEFT TOTAL KNEE ARTHROPLASTY (Left)  SURGEON:  Surgeon(s) and Role:    * Tobi Bastos, MD - Primary  PHYSICIAN ASSISTANT:Amber Evansville PA   ASSISTANTS:Amber Charlotte PA  ANESTHESIA:   spinal  EBL:  Total I/O In: 1000 [I.V.:1000] Out: 100 [Urine:100]  BLOOD ADMINISTERED:none  DRAINS: (One) Hemovact drain(s) in the Left Knee with  Suction Open   LOCAL MEDICATIONS USED:  MARCAINE 20cc of 0.25% and 20cc of Exparel mixed with20cc of Normal Saline.    SPECIMEN:  No Specimen  DISPOSITION OF SPECIMEN:  N/A  COUNTS:  YES  TOURNIQUET:  * Missing tourniquet times found for documented tourniquets in log:  201890 *  DICTATION: .Other Dictation: Dictation Number [id number]  PLAN OF CARE: Admit to inpatient   PATIENT DISPOSITION:  Stable in OR   Delay start of Pharmacological VTE agent (>24hrs) due to surgical blood loss or risk of bleeding: yes

## 2014-12-08 NOTE — Anesthesia Procedure Notes (Signed)
Spinal Patient location during procedure: OR Start time: 12/08/2014 7:33 AM End time: 12/08/2014 7:38 AM Staffing Anesthesiologist: Roderic Palau E Performed by: anesthesiologist  Preanesthetic Checklist Completed: patient identified, surgical consent, pre-op evaluation, timeout performed, IV checked, risks and benefits discussed and monitors and equipment checked Spinal Block Patient position: sitting Prep: Betadine Patient monitoring: cardiac monitor, continuous pulse ox and blood pressure Approach: midline Location: L2-3 Injection technique: single-shot Needle Needle type: Pencan and Spinocan  Needle gauge: 22 G Needle length: 9 cm Assessment Sensory level: T12 Additional Notes Functioning IV was confirmed and monitors were applied. Sterile prep and drape, including hand hygiene and sterile gloves were used. The patient was positioned and the spine was prepped. The skin was anesthetized with lidocaine.  Free flow of clear CSF was obtained prior to injecting local anesthetic into the CSF.  The spinal needle aspirated freely following injection.  The needle was carefully withdrawn.  The patient tolerated the procedure well.

## 2014-12-08 NOTE — Progress Notes (Signed)
Clinical Social Work Department CLINICAL SOCIAL WORK PLACEMENT NOTE 12/08/2014  Patient:  [name], [name]  Account Number:  [id number] Admit date:  12/08/2014  Clinical Social Worker:  Werner Lean, LCSW  Date/time:  12/08/2014 01:48 PM  Clinical Social Work is seeking post-discharge placement for this patient at the following level of care:   SKILLED NURSING   (*CSW will update this form in Epic as items are completed)     Patient/family provided with [facility] Department of Clinical Social Work's list of facilities offering this level of care within the geographic area requested by the patient (or if unable, by the patient's family).  12/08/2014  Patient/family informed of their freedom to choose among providers that offer the needed level of care, that participate in Medicare, Medicaid or managed care program needed by the patient, have an available bed and are willing to accept the patient.    Patient/family informed of [facility]' ownership interest in [facility], as well as of the fact that they are under no obligation to receive care at this facility.  PASARR submitted to EDS on 12/08/2014 PASARR number received on 12/08/2014  FL2 transmitted to all facilities in geographic area requested by pt/family on  12/08/2014 FL2 transmitted to all facilities within larger geographic area on   Patient informed that his/her managed care company has contracts with or will negotiate with  certain facilities, including the following:     Patient/family informed of bed offers received:  12/08/2014 Patient chooses bed at  Physician recommends and patient chooses bed at    Patient to be transferred to  on   Patient to be transferred to facility by  Patient and family notified of transfer on  Name of family member notified:    The following physician request were entered in Epic:   Additional Comments:  Werner Lean LCSW [phone]

## 2014-12-08 NOTE — Progress Notes (Signed)
Clinical Social Work Department BRIEF PSYCHOSOCIAL ASSESSMENT 12/08/2014  Patient:  [name], [name]     Account Number:  [id number]     Admit date:  12/08/2014  Clinical Social Worker:  Lacie Scotts  Date/Time:  12/08/2014 01:38 PM  Referred by:  Physician  Date Referred:  12/08/2014 Referred for  SNF Placement   Other Referral:   Interview type:  Patient Other interview type:    PSYCHOSOCIAL DATA Living Status:  ALONE Admitted from facility:   Level of care:   Primary support name:  Assunta Found Primary support relationship to patient:  CHILD, ADULT Degree of support available:   supportive    CURRENT CONCERNS Current Concerns  Post-Acute Placement   Other Concerns:    SOCIAL WORK ASSESSMENT / PLAN Pt is a 74 yr old female living at home prior to hospitalization. CSW met with pt / family to assist with d/c planning. This is a planned admission. Pt has made prior arrangements to have ST Rehab at [facility] in [place] following hospital d/c. CSW has contacted SNF and d/c plan has been confirmed pending insurance authorization. CSW will assist with the insurance authorization process.   Assessment/plan status:  Psychosocial Support/Ongoing Assessment of Needs Other assessment/ plan:   Information/referral to community resources:   Insurance coverage for SNF and ambulance transport reviewed.    PATIENT'S/FAMILY'S RESPONSE TO PLAN OF CARE: " I'm starting to feel a little sore. "  Pt is happy surgery is over. " I don't want to go to rehab but the doctor said I can't go home alone. The only place I'll go for rehab is [facility]. "  Pt is looking forward to getting through rehab and returning home.    Werner Lean LCSW [phone]

## 2014-12-09 LAB — BASIC METABOLIC PANEL
Anion gap: 9 (ref 5–15)
BUN: 16 mg/dL (ref 6–23)
CO2: 28 mmol/L (ref 19–32)
Calcium: 8.8 mg/dL (ref 8.4–10.5)
Chloride: 103 mmol/L (ref 96–112)
Creatinine, Ser: 0.56 mg/dL (ref 0.50–1.10)
GFR calc Af Amer: >90 mL/min (ref >90)
GLUCOSE: 167 mg/dL — AB (ref 70–99)
Potassium: 4.3 mmol/L (ref 3.5–5.1)
SODIUM: 140 mmol/L (ref 135–145)

## 2014-12-09 LAB — GLUCOSE, CAPILLARY
Glucose-Capillary: 137 mg/dL — ABNORMAL HIGH (ref 70–99)
Glucose-Capillary: 112 mg/dL — ABNORMAL HIGH (ref 70–99)
Glucose-Capillary: 194 mg/dL — ABNORMAL HIGH (ref 70–99)
Glucose-Capillary: 144 mg/dL — ABNORMAL HIGH (ref 70–99)

## 2014-12-09 LAB — CBC
HCT: 32.3 % — ABNORMAL LOW (ref 36.0–46.0)
Hemoglobin: 10.5 g/dL — ABNORMAL LOW (ref 12.0–15.0)
MCH: 29.2 pg (ref 26.0–34.0)
MCHC: 32.5 g/dL (ref 30.0–36.0)
MCV: 89.7 fL (ref 78.0–100.0)
Platelets: 177 10*3/uL (ref 150–400)
RBC: 3.6 MIL/uL — ABNORMAL LOW (ref 3.87–5.11)
RDW: 15 % (ref 11.5–15.5)
WBC: 9.3 10*3/uL (ref 4.0–10.5)

## 2014-12-09 NOTE — Evaluation (Signed)
Occupational Therapy Evaluation Patient Details Name: [name] MRN: [id number] DOB: December 05, 1940 Today's Date: 12/09/2014    History of Present Illness 74 yo female s/p L TKA 12/08/14.    Clinical Impression   Pt is very motivated and doing well with ADL. Planning for SNF at d/c as she lives alone. Will follow to progress ADL independence and safety.    Follow Up Recommendations  SNF;Supervision/Assistance - 24 hour    Equipment Recommendations  None recommended by OT    Recommendations for Other Services       Precautions / Restrictions Precautions Precautions: Fall;Knee Required Braces or Orthoses:  (did SLR this date) Restrictions Weight Bearing Restrictions: No LLE Weight Bearing: Weight bearing as tolerated      Mobility Bed Mobility Overal bed mobility: Needs Assistance Bed Mobility: Supine to Sit     Supine to sit: HOB elevated;Supervision        Transfers Overall transfer level: Needs assistance Equipment used: Rolling walker (2 wheeled) Transfers: Sit to/from Stand Sit to Stand: Min guard         General transfer comment: verbal cues for hand placement.     Balance                                            ADL Overall ADL's : Needs assistance/impaired Eating/Feeding: Independent;Sitting   Grooming: Wash/dry hands;Set up;Sitting   Upper Body Bathing: Set up;Sitting   Lower Body Bathing: Minimal assistance;Sit to/from stand   Upper Body Dressing : Set up;Sitting   Lower Body Dressing: Minimal assistance;Sit to/from stand   Toilet Transfer: Minimal assistance;Ambulation;BSC;RW   Toileting- Clothing Manipulation and Hygiene: Minimal assistance;Sit to/from stand         General ADL Comments: Pt able to reach down to foot but just min assist to start sock over L foot to avoid twisting of L knee. Pt able to perform SLR today from bed level and did well with bringing herself around to EOB. Educated on safety with walker  in the bathroom and backing fully up to commode surface. Pt is very motivated.      Vision     Perception     Praxis      Pertinent Vitals/Pain Pain Assessment: 0-10 Pain Score: 7  Pain Location: L knee Pain Descriptors / Indicators: Aching Pain Intervention(s): Repositioned;Ice applied     Hand Dominance     Extremity/Trunk Assessment Upper Extremity Assessment Upper Extremity Assessment: Overall WFL for tasks assessed           Communication Communication Communication: No difficulties   Cognition Arousal/Alertness: Awake/alert Behavior During Therapy: WFL for tasks assessed/performed Overall Cognitive Status: Within Functional Limits for tasks assessed                     General Comments       Exercises       Shoulder Instructions      Home Living Family/patient expects to be discharged to:: Skilled nursing facility Living Arrangements: Alone                           Home Equipment: Walker - 2 wheels;Bedside commode          Prior Functioning/Environment Level of Independence: Independent             OT Diagnosis: Generalized weakness  OT Problem List: Decreased strength;Decreased knowledge of use of DME or AE   OT Treatment/Interventions: Self-care/ADL training;Patient/family education;Therapeutic activities;DME and/or AE instruction    OT Goals(Current goals can be found in the care plan section) Acute Rehab OT Goals Patient Stated Goal: rehab to continue therapy OT Goal Formulation: With patient Time For Goal Achievement: 12/16/14 Potential to Achieve Goals: Good  OT Frequency: Min 2X/week   Barriers to D/C:            Co-evaluation              End of Session Equipment Utilized During Treatment: Rolling walker  Activity Tolerance: Patient tolerated treatment well Patient left: in chair;with call bell/phone within reach   Time: 1105-1125 OT Time Calculation (min): 20 min Charges:  OT General  Charges $OT Visit: 1 Procedure OT Evaluation $Initial OT Evaluation Tier I: 1 Procedure G-Codes:    Jules Schick  [phone] 12/09/2014, 11:49 AM

## 2014-12-09 NOTE — Progress Notes (Signed)
   Subjective: 1 Day Post-Op Procedure(s) (LRB): LEFT TOTAL KNEE ARTHROPLASTY (Left) Patient reports pain as mild.   Patient seen in rounds with Dr. Gladstone Lighter. Patient is well, and has had no acute complaints or problems. She reports that she has some discomfort but the severe pain that she had before surgery is gone. No issues overnight. No SOB or chest pain.   Plan is to go Skilled nursing facility after hospital stay.  Objective: Vital signs in last 24 hours: Temp:  [97.5 F (36.4 C)-98.5 F (36.9 C)] 97.5 F (36.4 C) (02/24 0515) Pulse Rate:  [61-80] 63 (02/24 0515) Resp:  [14-20] 20 (02/24 0515) BP: (110-139)/(49-76) 117/51 mmHg (02/24 0515) SpO2:  [95 %-100 %] 96 % (02/24 0515) Weight:  [92.987 kg (205 lb)] 92.987 kg (205 lb) (02/23 1040)  Intake/Output from previous day:  Intake/Output Summary (Last 24 hours) at 12/09/14 0825 Last data filed at 12/09/14 0515  Gross per 24 hour  Intake   2660 ml  Output   3320 ml  Net   -660 ml    Labs:  Recent Labs  12/09/14 0525  HGB 10.5*    Recent Labs  12/09/14 0525  WBC 9.3  RBC 3.60*  HCT 32.3*  PLT 177    Recent Labs  12/09/14 0525  NA 140  K 4.3  CL 103  CO2 28  BUN 16  CREATININE 0.56  GLUCOSE 167*  CALCIUM 8.8    EXAM General - Patient is Alert and Oriented Extremity - Neurologically intact Intact pulses distally Dorsiflexion/Plantar flexion intact No cellulitis present Compartment soft Dressing - dressing C/D/I Motor Function - intact, moving foot and toes well on exam.  Hemovac pulled without difficulty.  Past Medical History  Diagnosis Date  . Complication of anesthesia   . PONV (postoperative nausea and vomiting)   . Hypertension   . Arthritis   . GERD (gastroesophageal reflux disease)   . Vitamin B 12 deficiency   . Diabetes mellitus without complication   . Hypothyroidism   . Difficulty sleeping     Assessment/Plan: 1 Day Post-Op Procedure(s) (LRB): LEFT TOTAL KNEE ARTHROPLASTY  (Left) Active Problems:   Total knee replacement status  Estimated body mass index is 34.11 kg/(m^2) as calculated from the following:   Height as of this encounter: 5\' 5"  (1.651 m).   Weight as of this encounter: 92.987 kg (205 lb). Advance diet Up with therapy D/C IV fluids Plan for discharge tomorrow  DVT Prophylaxis - Xarelto Weight-Bearing as tolerated   She is doing great. Will continue PT today. Plan for Dc to [facility] tomorrow.  Ardeen Jourdain, PA-C Orthopaedic Surgery 12/09/2014, 8:25 AM

## 2014-12-09 NOTE — Progress Notes (Signed)
Utilization review completed.  

## 2014-12-09 NOTE — Discharge Instructions (Addendum)
Walk with your walker. Weight bearing as  tolerated Do not remove your dressing unless it appears compromised. Continue to change dressing over drain site as needed. Shower only, no tub bath. Call if any temperatures greater than 101 or any wound complications: [phone] during the day and ask for Dr. Charlestine Night nurse, Brunilda Payor.   Information on my medicine - XARELTO (Rivaroxaban)  This medication education was reviewed with me or my healthcare representative as part of my discharge preparation.  The pharmacist that spoke with me during my hospital stay was:  Absher, Julieta Bellini, RPH  Why was Xarelto prescribed for you? Xarelto was prescribed for you to reduce the risk of blood clots forming after orthopedic surgery. The medical term for these abnormal blood clots is venous thromboembolism (VTE).  What do you need to know about xarelto ? Take your Xarelto ONCE DAILY at the same time every day. You may take it either with or without food.  If you have difficulty swallowing the tablet whole, you may crush it and mix in applesauce just prior to taking your dose.  Take Xarelto exactly as prescribed by your doctor and DO NOT stop taking Xarelto without talking to the doctor who prescribed the medication.  Stopping without other VTE prevention medication to take the place of Xarelto may increase your risk of developing a clot.  After discharge, you should have regular check-up appointments with your healthcare provider that is prescribing your Xarelto.    What do you do if you miss a dose? If you miss a dose, take it as soon as you remember on the same day then continue your regularly scheduled once daily regimen the next day. Do not take two doses of Xarelto on the same day.   Important Safety Information A possible side effect of Xarelto is bleeding. You should call your healthcare provider right away if you experience any of the following: ? Bleeding from an injury or your nose  that does not stop. ? Unusual colored urine (red or dark brown) or unusual colored stools (red or black). ? Unusual bruising for unknown reasons. ? A serious fall or if you hit your head (even if there is no bleeding).  Some medicines may interact with Xarelto and might increase your risk of bleeding while on Xarelto. To help avoid this, consult your healthcare provider or pharmacist prior to using any new prescription or non-prescription medications, including herbals, vitamins, non-steroidal anti-inflammatory drugs (NSAIDs) and supplements.  This website has more information on Xarelto: [email or web address]-benson.[email or web address]

## 2014-12-09 NOTE — Progress Notes (Signed)
Physical Therapy Treatment Patient Details Name: [name] MRN: [id number] DOB: 1941/07/10 Today's Date: 12/09/2014    History of Present Illness 74 yo female s/p L TKA 12/08/14.     PT Comments    Progressing with mobility. Possible d/c to [facility] on tomorrow.   Follow Up Recommendations  SNF     Equipment Recommendations  None recommended by PT    Recommendations for Other Services OT consult     Precautions / Restrictions Precautions Precautions: Knee;Fall Required Braces or Orthoses: Knee Immobilizer - Left Knee Immobilizer - Left: Discontinue once straight leg raise with < 10 degree lag Restrictions Weight Bearing Restrictions: No LLE Weight Bearing: Weight bearing as tolerated    Mobility  Bed Mobility Overal bed mobility: Needs Assistance Bed Mobility: Supine to Sit;Sit to Supine     Supine to sit: Min assist Sit to supine: Min assist   General bed mobility comments: Assist for L LE.   Transfers Overall transfer level: Needs assistance Equipment used: Rolling walker (2 wheeled) Transfers: Sit to/from Stand Sit to Stand: Min guard         General transfer comment: verbal cues for hand placement. close guard for safety  Ambulation/Gait Ambulation/Gait assistance: Min guard Ambulation Distance (Feet): 75 Feet Assistive device: Rolling walker (2 wheeled) Gait Pattern/deviations: Step-to pattern;Decreased stride length;Antalgic     General Gait Details: Assist to stabilize. VCs safety, technique, sequence. close guard for safety   Stairs            Wheelchair Mobility    Modified Rankin (Stroke Patients Only)       Balance                                    Cognition Arousal/Alertness: Awake/alert Behavior During Therapy: WFL for tasks assessed/performed Overall Cognitive Status: Within Functional Limits for tasks assessed                      Exercises Total Joint Exercises Ankle Circles/Pumps: AROM;Both;10  reps;Supine Quad Sets: AROM;Both;10 reps;Supine Heel Slides: AAROM;Left;10 reps;Supine Hip ABduction/ADduction: AAROM;Left;10 reps;Supine Straight Leg Raises: AAROM;Left;10 reps;Supine Goniometric ROM: 10-45 degrees    General Comments General comments (skin integrity, edema, etc.): min assist to manage gown in standing.      Pertinent Vitals/Pain Pain Assessment: 0-10 Pain Score: 8  Pain Location: L knee Pain Descriptors / Indicators: Aching;Sore Pain Intervention(s): Monitored during session;Premedicated before session;Ice applied    Home Living Family/patient expects to be discharged to:: Skilled nursing facility Living Arrangements: Alone           Home Equipment: Gilford Rile - 2 wheels;Bedside commode      Prior Function Level of Independence: Independent          PT Goals (current goals can now be found in the care plan section) Acute Rehab PT Goals Patient Stated Goal: rehab to continue therapy Progress towards PT goals: Progressing toward goals    Frequency  7X/week    PT Plan Current plan remains appropriate    Co-evaluation             End of Session Equipment Utilized During Treatment: Gait belt Activity Tolerance: Patient tolerated treatment well Patient left: in bed;with call bell/phone within reach     Time: 1320-1350 PT Time Calculation (min) (ACUTE ONLY): 30 min  Charges:  $Gait Training: 8-22 mins $Therapeutic Exercise: 8-22 mins  G Codes:      Weston Anna, MPT Pager: [phone]

## 2014-12-09 NOTE — Care Management Note (Signed)
    Page 1 of 1   12/09/2014     2:31:21 PM CARE MANAGEMENT NOTE 12/09/2014  Patient:  [name], [name]   Account Number:  [id number]  Date Initiated:  12/09/2014  Documentation initiated by:  [facility]  Subjective/Objective Assessment:   adm: LEFT TOTAL KNEE ARTHROPLASTY (Left)     Action/Plan:   discharge planning   Anticipated DC Date:  12/10/2014   Anticipated DC Plan:  [place]  CM consult      Choice offered to / List presented to:             Status of service:  Completed, signed off Medicare Important Message given?   (If response is "NO", the following Medicare IM given date fields will be blank) Date Medicare IM given:   Medicare IM given by:   Date Additional Medicare IM given:   Additional Medicare IM given by:    Discharge Disposition:  [place]  Per UR Regulation:    If discussed at Long Length of Stay Meetings, dates discussed:    Comments:  12/09/14 14:25 CM notes pt to go to SNF; CSW arranging. No other Cm needs were communicated.  Mariane Masters, BSN, [phone].

## 2014-12-09 NOTE — Op Note (Signed)
[name], [name]                  ACCOUNT NO.:  [id number]  MEDICAL RECORD NO.:  [id number]  LOCATION:                                 FACILITY:  PHYSICIAN:  Mace Weinberg A. Adler Alton, M.D.DATE OF BIRTH:  1941-09-21  DATE OF PROCEDURE:  12/08/2014 DATE OF DISCHARGE:                              OPERATIVE REPORT   SURGEON:  Kipp Brood. 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  OPERATION:  Left total knee arthroplasty utilizing the DePuy System. All 3 components were cemented.  The sizes used were as follows.  The femoral component was a size 4 narrow posterior cruciate sacrificing type.  The tibial tray was a size 3.  The insert was a size 4, 15-mm thickness.  The patella was a size 38 with 3 pegs.  The gentamicin was used in the cement.  DESCRIPTION OF PROCEDURE:  Under spinal anesthesia, routine orthopedic prep and draping of the left lower extremity was carried out. Appropriate time-out was first carried out, and I also marked the appropriate left leg in the holding area.  At this time, the patient had 900 mg of Cleocin.  Following that after the time-out and the prep and draping as I mentioned, we then exsanguinated the leg with the Esmarch. The tourniquet was elevated to 325 mmHg.  At this time, the knee was flexed and placed with [facility] holder and an anterior approach of the knee was carried out.  Two flaps were created.  At this time, I then carried out a median parapatellar incision.  Patellar was reflected laterally.  I then did medial and lateral meniscectomies and excised the anterior and posterior cruciate ligaments, and as I mentioned, I did medial and lateral meniscectomies.  At this particular time, we then made our initial drill hole in the intercondylar notch.  The canal finder was inserted.  We thoroughly  irrigated out the canal and then removed 12 mm thickness off the distal femur.  Next, a jig was inserted for measurement purposes and it was measured to be a size 4 length.  We then did our anterior-posterior chamfering cuts for a size 4 femoral component.  Following that, attention was directed to the tibia in the usual fashion.  We measured the tibia to be a size 3.  We then removed 6 mm thickness off the affected medial side, utilizing intramedullary guides. After we had made the appropriate cut, we then thoroughly irrigated out the canal as well.  We then inserted our lamina spreaders and the removed posterior spurs.  We then did our measurements with the tension blocks in the usual fashion and selected a 15 mm.  Following that, we then continued to prepare the tibia.  We cut our keel cut out of the tibia in usual fashion.  The notch cut then out of the distal femur in the usual fashion.  We then inserted our trial components and we went through range of motion, felt after various testing that the 50 mm thickness insert was the best to  use for stability purposes in flexion and extension.  After that, we then did a resurfacing procedure on the patella for a 38 patella, 3 drill holes were made on the patella.  All trial components were removed.  We thoroughly irrigated out the knee.  I then water picked the knee out again, dried the knee out, and then cemented all 3 components in simultaneously.  We then injected 20 mL of 0.25% Marcaine into the soft tissue.  Once the cement was dried and hardened, we then removed all loose pieces of cement.  We water picked out the posterior aspect of the knee to make sure there are no loose pieces of cement there was well.  I then inserted an anchor to anchor down the patellar tendon.  Once we had good function, we then inserted our permanent rotating platform insert.  I did that before I anchored the patellar tendon down.  The size was a size 4,  15-mm thickness.  We had a nice reduction of the knee and good flexion and extension.  Good extension and the knee was stable.  We then inserted a Hemovac drain after I irrigated the wound out again with antibiotic solution and I then closed the wound layers in usual fashion.  Before we closed the skin, we injected 20 mL of Exparel mix with 20 mL of normal saline. Remaining part of the wound was closed as I mentioned.          ______________________________ Kipp Brood Gladstone Lighter, M.D.     RAG/MEDQ  D:  12/08/2014  T:  12/09/2014  Job:  [id number]

## 2014-12-10 DIAGNOSIS — D62 Acute posthemorrhagic anemia: Secondary | ICD-10-CM | POA: Diagnosis not present

## 2014-12-10 LAB — BASIC METABOLIC PANEL
Anion gap: 8 (ref 5–15)
BUN: 18 mg/dL (ref 6–23)
CO2: 28 mmol/L (ref 19–32)
Calcium: 8.5 mg/dL (ref 8.4–10.5)
Chloride: 103 mmol/L (ref 96–112)
Creatinine, Ser: 0.64 mg/dL (ref 0.50–1.10)
GFR calc Af Amer: >90 mL/min (ref >90)
GFR, EST NON AFRICAN AMERICAN: 86 mL/min — AB (ref >90)
Glucose, Bld: 146 mg/dL — ABNORMAL HIGH (ref 70–99)
POTASSIUM: 3.8 mmol/L (ref 3.5–5.1)
SODIUM: 139 mmol/L (ref 135–145)

## 2014-12-10 LAB — CBC
HEMATOCRIT: 28.9 % — AB (ref 36.0–46.0)
Hemoglobin: 9.5 g/dL — ABNORMAL LOW (ref 12.0–15.0)
MCH: 29.7 pg (ref 26.0–34.0)
MCHC: 32.9 g/dL (ref 30.0–36.0)
MCV: 90.3 fL (ref 78.0–100.0)
PLATELETS: 148 10*3/uL — AB (ref 150–400)
RBC: 3.2 MIL/uL — ABNORMAL LOW (ref 3.87–5.11)
RDW: 15.4 % (ref 11.5–15.5)
WBC: 6.3 10*3/uL (ref 4.0–10.5)

## 2014-12-10 LAB — GLUCOSE, CAPILLARY: Glucose-Capillary: 123 mg/dL — ABNORMAL HIGH (ref 70–99)

## 2014-12-10 MED ORDER — FERROUS SULFATE 325 (65 FE) MG PO TABS: 325.0000 mg | ORAL_TABLET | Freq: Three times a day (TID) | ORAL | Status: DC

## 2014-12-10 MED ORDER — METHOCARBAMOL 500 MG PO TABS: 500.0000 mg | ORAL_TABLET | Freq: Four times a day (QID) | ORAL | Status: DC | PRN

## 2014-12-10 MED ORDER — RIVAROXABAN 10 MG PO TABS: 10.0000 mg | ORAL_TABLET | Freq: Every day | ORAL | Status: DC

## 2014-12-10 MED ORDER — DOCUSATE SODIUM 100 MG PO CAPS
100.0000 mg | ORAL_CAPSULE | Freq: Two times a day (BID) | ORAL | Status: DC
  Administered 2014-12-10: 100 mg via ORAL

## 2014-12-10 MED ORDER — OXYCODONE-ACETAMINOPHEN 5-325 MG PO TABS: 1.0000 | ORAL_TABLET | ORAL | Status: DC | PRN

## 2014-12-10 MED ORDER — DOCUSATE SODIUM 100 MG PO CAPS: 100.0000 mg | ORAL_CAPSULE | Freq: Two times a day (BID) | ORAL | Status: DC

## 2014-12-10 NOTE — Progress Notes (Signed)
Report called to [facility] [place] [address].Bethann Punches RN

## 2014-12-10 NOTE — Progress Notes (Signed)
Physical Therapy Treatment Patient Details Name: [name] MRN: [id number] DOB: 10/31/1940 Today's Date: 12/10/2014    History of Present Illness 74 yo female s/p L TKA 12/08/14.     PT Comments    Progressing well with mobility. Plan is for d/c to SNF on today.   Follow Up Recommendations  SNF     Equipment Recommendations  None recommended by PT    Recommendations for Other Services       Precautions / Restrictions Precautions Precautions: Fall;Knee Required Braces or Orthoses:  (able to SLF so KI not used 12/10/14) Restrictions Weight Bearing Restrictions: No LLE Weight Bearing: Weight bearing as tolerated    Mobility  Bed Mobility               General bed mobility comments: pt oob in recliner  Transfers Overall transfer level: Needs assistance Equipment used: Rolling walker (2 wheeled) Transfers: Sit to/from Stand Sit to Stand: Supervision         General transfer comment: VCs safety, hand placement  Ambulation/Gait Ambulation/Gait assistance: Min guard Ambulation Distance (Feet): 150 Feet Assistive device: Rolling walker (2 wheeled) Gait Pattern/deviations: Step-to pattern;Step-through pattern;Decreased stride length     General Gait Details: close guard for safety. Pt tolerated increased distance well.    Stairs            Wheelchair Mobility    Modified Rankin (Stroke Patients Only)       Balance                                    Cognition Arousal/Alertness: Awake/alert Behavior During Therapy: WFL for tasks assessed/performed Overall Cognitive Status: Within Functional Limits for tasks assessed                      Exercises Total Joint Exercises Ankle Circles/Pumps: AROM;Both;10 reps;Supine Quad Sets: AROM;Both;10 reps;Supine Hip ABduction/ADduction: Left;10 reps;Supine;AROM Straight Leg Raises: Left;10 reps;Supine;AROM Knee Flexion: AAROM;Left;10 reps;Seated Goniometric ROM: 10-60 degrees    General Comments        Pertinent Vitals/Pain Pain Assessment: 0-10 Pain Score: 6  Pain Location: L knee Pain Descriptors / Indicators: Aching;Sore Pain Intervention(s): Monitored during session;Repositioned;Ice applied    Home Living                      Prior Function            PT Goals (current goals can now be found in the care plan section) Progress towards PT goals: Progressing toward goals    Frequency  7X/week    PT Plan Current plan remains appropriate    Co-evaluation             End of Session   Activity Tolerance: Patient tolerated treatment well Patient left: in chair;with call bell/phone within reach     Time: 0907-0922 PT Time Calculation (min) (ACUTE ONLY): 15 min  Charges:  $Gait Training: 8-22 mins                    G Codes:      Weston Anna, MPT Pager: [phone]

## 2014-12-10 NOTE — Progress Notes (Signed)
Subjective: 2 Days Post-Op Procedure(s) (LRB): LEFT TOTAL KNEE ARTHROPLASTY (Left) Patient reports pain as 2 on 0-10 scale. Doing very well today. Will DC today.   Objective: Vital signs in last 24 hours: Temp:  [97.9 F (36.6 C)-99.2 F (37.3 C)] 97.9 F (36.6 C) (02/25 0550) Pulse Rate:  [64-67] 64 (02/25 0550) Resp:  [16-20] 18 (02/25 0550) BP: (124-139)/(44-60) 139/49 mmHg (02/25 0550) SpO2:  [92 %-99 %] 95 % (02/25 0550)  Intake/Output from previous day: 02/24 0701 - 02/25 0700 In: 480 [P.O.:480] Out: 1725 [Urine:1725] Intake/Output this shift:     Recent Labs  12/09/14 0525 12/10/14 0525  HGB 10.5* 9.5*    Recent Labs  12/09/14 0525 12/10/14 0525  WBC 9.3 6.3  RBC 3.60* 3.20*  HCT 32.3* 28.9*  PLT 177 148*    Recent Labs  12/09/14 0525 12/10/14 0525  NA 140 139  K 4.3 3.8  CL 103 103  CO2 28 28  BUN 16 18  CREATININE 0.56 0.64  GLUCOSE 167* 146*  CALCIUM 8.8 8.5   No results for input(s): LABPT, INR in the last 72 hours.  Dorsiflexion/Plantar flexion intact No cellulitis present  Assessment/Plan: 2 Days Post-Op Procedure(s) (LRB): LEFT TOTAL KNEE ARTHROPLASTY (Left) Discharge to SNFPost-Op Blood Loss Anemia.  Tequila Rottmann A 12/10/2014, 7:28 AM

## 2014-12-10 NOTE — Discharge Summary (Signed)
Physician Discharge Summary   Patient ID: [name] MRN: [id number] DOB/AGE: 24-Mar-1941 74 y.o.  Admit date: 12/08/2014 Discharge date: 12/10/2014  Primary Diagnosis: Primary osteoarthritis, left knee   Admission Diagnoses:  Past Medical History  Diagnosis Date  . Complication of anesthesia   . PONV (postoperative nausea and vomiting)   . Hypertension   . Arthritis   . GERD (gastroesophageal reflux disease)   . Vitamin B 12 deficiency   . Diabetes mellitus without complication   . Hypothyroidism   . Difficulty sleeping    Discharge Diagnoses:   Active Problems:   Total knee replacement status   Postoperative anemia due to acute blood loss  Estimated body mass index is 34.11 kg/(m^2) as calculated from the following:   Height as of this encounter: _0  (1.651 m).   Weight as of this encounter: 92.987 kg (205 lb).  Procedure:  Procedure(s) (LRB): LEFT TOTAL KNEE ARTHROPLASTY (Left)   Consults: None  HPI: [name], 74 y.o. female, has a history of pain and functional disability in the left knee due to arthritis and has failed non-surgical conservative treatments for greater than 12 weeks to includeNSAID's and/or analgesics, corticosteriod injections, viscosupplementation injections, flexibility and strengthening excercises and activity modification. Onset of symptoms was gradual, starting 3 years ago with gradually worsening course since that time. The patient noted prior procedures on the knee to include arthroscopy and menisectomy on the left knee(s). Patient currently rates pain in the left knee(s) at 7 out of 10 with activity. Patient has night pain, worsening of pain with activity and weight bearing, pain that interferes with activities of daily living, pain with passive range of motion, crepitus and joint swelling. Patient has evidence of periarticular osteophytes and joint space narrowing by imaging studies. There is no active infection.   Laboratory  Data: Admission on 12/08/2014  Component Date Value Ref Range Status  . ABO/RH(D) 12/08/2014 O POS   Final  . Antibody Screen 12/08/2014 NEG   Final  . Sample Expiration 12/08/2014 12/11/2014   Final  . Glucose-Capillary 12/08/2014 118* 70 - 99 mg/dL Final  . Comment 1 12/08/2014 Notify RN   Final  . Glucose-Capillary 12/08/2014 133* 70 - 99 mg/dL Final  . Glucose-Capillary 12/08/2014 193* 70 - 99 mg/dL Final  . WBC 12/09/2014 9.3  4.0 - 10.5 K/uL Final  . RBC 12/09/2014 3.60* 3.87 - 5.11 MIL/uL Final  . Hemoglobin 12/09/2014 10.5* 12.0 - 15.0 g/dL Final  . HCT 12/09/2014 32.3* 36.0 - 46.0 % Final  . MCV 12/09/2014 89.7  78.0 - 100.0 fL Final  . MCH 12/09/2014 29.2  26.0 - 34.0 pg Final  . MCHC 12/09/2014 32.5  30.0 - 36.0 g/dL Final  . RDW 12/09/2014 15.0  11.5 - 15.5 % Final  . Platelets 12/09/2014 177  150 - 400 K/uL Final  . Sodium 12/09/2014 140  135 - 145 mmol/L Final  . Potassium 12/09/2014 4.3  3.5 - 5.1 mmol/L Final  . Chloride 12/09/2014 103  96 - 112 mmol/L Final  . CO2 12/09/2014 28  19 - 32 mmol/L Final  . Glucose, Bld 12/09/2014 167* 70 - 99 mg/dL Final  . BUN 12/09/2014 16  6 - 23 mg/dL Final  . Creatinine, Ser 12/09/2014 0.56  0.50 - 1.10 mg/dL Final  . Calcium 12/09/2014 8.8  8.4 - 10.5 mg/dL Final  . GFR calc non Af Amer 12/09/2014 >90  >90 mL/min Final  . GFR calc Af Amer 12/09/2014 >90  >90 mL/min Final  Comment: (NOTE) The eGFR has been calculated using the CKD EPI equation. This calculation has not been validated in all clinical situations. eGFR's persistently <90 mL/min signify possible Chronic Kidney Disease.   . Anion gap 12/09/2014 9  5 - 15 Final  . Glucose-Capillary 12/08/2014 192* 70 - 99 mg/dL Final  . Comment 1 12/08/2014 Notify RN   Final  . Comment 2 12/08/2014 Document in Chart   Final  . Glucose-Capillary 12/09/2014 137* 70 - 99 mg/dL Final  . Glucose-Capillary 12/09/2014 112* 70 - 99 mg/dL Final  . Glucose-Capillary 12/09/2014 194* 70 - 99  mg/dL Final  . WBC 12/10/2014 6.3  4.0 - 10.5 K/uL Final  . RBC 12/10/2014 3.20* 3.87 - 5.11 MIL/uL Final  . Hemoglobin 12/10/2014 9.5* 12.0 - 15.0 g/dL Final  . HCT 12/10/2014 28.9* 36.0 - 46.0 % Final  . MCV 12/10/2014 90.3  78.0 - 100.0 fL Final  . MCH 12/10/2014 29.7  26.0 - 34.0 pg Final  . MCHC 12/10/2014 32.9  30.0 - 36.0 g/dL Final  . RDW 12/10/2014 15.4  11.5 - 15.5 % Final  . Platelets 12/10/2014 148* 150 - 400 K/uL Final  . Sodium 12/10/2014 139  135 - 145 mmol/L Final  . Potassium 12/10/2014 3.8  3.5 - 5.1 mmol/L Final  . Chloride 12/10/2014 103  96 - 112 mmol/L Final  . CO2 12/10/2014 28  19 - 32 mmol/L Final  . Glucose, Bld 12/10/2014 146* 70 - 99 mg/dL Final  . BUN 12/10/2014 18  6 - 23 mg/dL Final  . Creatinine, Ser 12/10/2014 0.64  0.50 - 1.10 mg/dL Final  . Calcium 12/10/2014 8.5  8.4 - 10.5 mg/dL Final  . GFR calc non Af Amer 12/10/2014 86* >90 mL/min Final  . GFR calc Af Amer 12/10/2014 >90  >90 mL/min Final   Comment: (NOTE) The eGFR has been calculated using the CKD EPI equation. This calculation has not been validated in all clinical situations. eGFR's persistently <90 mL/min signify possible Chronic Kidney Disease.   . Anion gap 12/10/2014 8  5 - 15 Final  . Glucose-Capillary 12/09/2014 144* 70 - 99 mg/dL Final  Orders Only on 12/08/2014  Component Date Value Ref Range Status  . ABO/RH(D) 12/08/2014 O POS   Final  Hospital Outpatient Visit on 12/01/2014  Component Date Value Ref Range Status  . MRSA, PCR 12/01/2014 NEGATIVE  NEGATIVE Final  . Staphylococcus aureus 12/01/2014 NEGATIVE  NEGATIVE Final   Comment:        The Xpert SA Assay (FDA approved for NASAL specimens in patients over 16 years of age), is one component of a comprehensive surveillance program.  Test performance has been validated by [facility] for patients greater than or equal to 84 year old. It is not intended to diagnose infection nor to guide or monitor treatment.   Marland Kitchen aPTT  12/01/2014 29  24 - 37 seconds Final  . WBC 12/01/2014 8.7  4.0 - 10.5 K/uL Final  . RBC 12/01/2014 4.60  3.87 - 5.11 MIL/uL Final  . Hemoglobin 12/01/2014 13.2  12.0 - 15.0 g/dL Final  . HCT 12/01/2014 41.6  36.0 - 46.0 % Final  . MCV 12/01/2014 90.4  78.0 - 100.0 fL Final  . MCH 12/01/2014 28.7  26.0 - 34.0 pg Final  . MCHC 12/01/2014 31.7  30.0 - 36.0 g/dL Final  . RDW 12/01/2014 15.3  11.5 - 15.5 % Final  . Platelets 12/01/2014 222  150 - 400 K/uL Final  .  Neutrophils Relative % 12/01/2014 62  43 - 77 % Final  . Neutro Abs 12/01/2014 5.5  1.7 - 7.7 K/uL Final  . Lymphocytes Relative 12/01/2014 28  12 - 46 % Final  . Lymphs Abs 12/01/2014 2.5  0.7 - 4.0 K/uL Final  . Monocytes Relative 12/01/2014 6  3 - 12 % Final  . Monocytes Absolute 12/01/2014 0.5  0.1 - 1.0 K/uL Final  . Eosinophils Relative 12/01/2014 3  0 - 5 % Final  . Eosinophils Absolute 12/01/2014 0.3  0.0 - 0.7 K/uL Final  . Basophils Relative 12/01/2014 1  0 - 1 % Final  . Basophils Absolute 12/01/2014 0.0  0.0 - 0.1 K/uL Final  . Sodium 12/01/2014 141  135 - 145 mmol/L Final  . Potassium 12/01/2014 4.4  3.5 - 5.1 mmol/L Final  . Chloride 12/01/2014 106  96 - 112 mmol/L Final  . CO2 12/01/2014 29  19 - 32 mmol/L Final  . Glucose, Bld 12/01/2014 103* 70 - 99 mg/dL Final  . BUN 12/01/2014 17  6 - 23 mg/dL Final  . Creatinine, Ser 12/01/2014 0.73  0.50 - 1.10 mg/dL Final  . Calcium 12/01/2014 9.6  8.4 - 10.5 mg/dL Final  . Total Protein 12/01/2014 7.2  6.0 - 8.3 g/dL Final  . Albumin 12/01/2014 4.3  3.5 - 5.2 g/dL Final  . AST 12/01/2014 23  0 - 37 U/L Final  . ALT 12/01/2014 26  0 - 35 U/L Final  . Alkaline Phosphatase 12/01/2014 43  39 - 117 U/L Final  . Total Bilirubin 12/01/2014 0.5  0.3 - 1.2 mg/dL Final  . GFR calc non Af Amer 12/01/2014 83* >90 mL/min Final  . GFR calc Af Amer 12/01/2014 >90  >90 mL/min Final   Comment: (NOTE) The eGFR has been calculated using the CKD EPI equation. This calculation has not  been validated in all clinical situations. eGFR's persistently <90 mL/min signify possible Chronic Kidney Disease.   . Anion gap 12/01/2014 6  5 - 15 Final  . Prothrombin Time 12/01/2014 13.1  11.6 - 15.2 seconds Final  . INR 12/01/2014 0.98  0.00 - 1.49 Final  . Color, Urine 12/01/2014 YELLOW  YELLOW Final  . APPearance 12/01/2014 CLEAR  CLEAR Final  . Specific Gravity, Urine 12/01/2014 1.012  1.005 - 1.030 Final  . pH 12/01/2014 5.0  5.0 - 8.0 Final  . Glucose, UA 12/01/2014 NEGATIVE  NEGATIVE mg/dL Final  . Hgb urine dipstick 12/01/2014 NEGATIVE  NEGATIVE Final  . Bilirubin Urine 12/01/2014 NEGATIVE  NEGATIVE Final  . Ketones, ur 12/01/2014 NEGATIVE  NEGATIVE mg/dL Final  . Protein, ur 12/01/2014 NEGATIVE  NEGATIVE mg/dL Final  . Urobilinogen, UA 12/01/2014 0.2  0.0 - 1.0 mg/dL Final  . Nitrite 12/01/2014 NEGATIVE  NEGATIVE Final  . Leukocytes, UA 12/01/2014 NEGATIVE  NEGATIVE Final   MICROSCOPIC NOT DONE ON URINES WITH NEGATIVE PROTEIN, BLOOD, LEUKOCYTES, NITRITE, OR GLUCOSE <1000 mg/dL.     X-Rays:Dg Chest 2 View  12/01/2014   CLINICAL DATA:  Left knee replacement.  Hypertension.  EXAM: CHEST  2 VIEW  COMPARISON:  None.  FINDINGS: Mediastinum hilar structures normal. Left base subsegmental atelectasis. No pleural effusion or pneumothorax. Heart size normal. Degenerative changes thoracic spine.  IMPRESSION: Mild left base subsegmental atelectasis, otherwise negative chest.   Electronically Signed   By: [place]   On: 12/01/2014 16:33    EKG: Orders placed or performed in visit on 12/01/14  . EKG 12-Lead  Hospital Course: [name] is a 74 y.o. who was admitted to [facility]. They were brought to the operating room on 12/08/2014 and underwent Procedure(s): LEFT TOTAL KNEE ARTHROPLASTY.  Patient tolerated the procedure well and was later transferred to the recovery room and then to the orthopaedic floor for postoperative care.  They were given PO and IV  analgesics for pain control following their surgery.  They were given 24 hours of postoperative antibiotics of  Anti-infectives    Start     Dose/Rate Route Frequency Ordered Stop   12/08/14 1400  clindamycin (CLEOCIN) IVPB 600 mg     600 mg 100 mL/hr over 30 Minutes Intravenous Every 6 hours 12/08/14 1045 12/08/14 2032   12/08/14 0811  polymyxin B 500,000 Units, bacitracin 50,000 Units in sodium chloride irrigation 0.9 % 500 mL irrigation  Status:  Discontinued       As needed 12/08/14 0811 12/08/14 0933   12/08/14 0540  clindamycin (CLEOCIN) IVPB 900 mg     900 mg 100 mL/hr over 30 Minutes Intravenous On call to O.R. 12/08/14 0540 12/08/14 0735     and started on DVT prophylaxis in the form of Xarelto.   PT and OT were ordered for total joint protocol.  Discharge planning consulted to help with postop disposition and equipment needs.  Patient had a fair night on the evening of surgery but did not get much sleep.  They started to get up OOB with therapy on day one. Hemovac drain was pulled without difficulty.  Continued to work with therapy into day two.  Patient was seen in rounds and was ready to go home.   Diet: Diabetic diet Activity:WBAT Follow-up:in 2 weeks Disposition - Skilled nursing facility Discharged Condition: stable   Discharge Instructions    Call MD / Call 911    Complete by:  As directed   If you experience chest pain or shortness of breath, CALL 911 and be transported to the hospital emergency room.  If you develope a fever above 101 F, pus (white drainage) or increased drainage or redness at the wound, or calf pain, call your surgeon's office.     Constipation Prevention    Complete by:  As directed   Drink plenty of fluids.  Prune juice may be helpful.  You may use a stool softener, such as Colace (over the counter) 100 mg twice a day.  Use MiraLax (over the counter) for constipation as needed.     Diet Carb Modified    Complete by:  As directed      Discharge  instructions    Complete by:  As directed   Walk with your walker. Weight bearing as  tolerated Do not remove your dressing unless it appears compromised. Continue to change dressing over drain site as needed. Shower only, no tub bath. Call if any temperatures greater than 101 or any wound complications: [phone] during the day and ask for Dr. Charlestine Night nurse, Brunilda Payor.     Do not put a pillow under the knee. Place it under the heel.    Complete by:  As directed      Driving restrictions    Complete by:  As directed   No driving     Increase activity slowly as tolerated    Complete by:  As directed             Medication List    STOP taking these medications        acetaminophen  325 MG tablet  Commonly known as:  TYLENOL     cyanocobalamin 1000 MCG tablet      TAKE these medications        ALPRAZolam 0.5 MG tablet  Commonly known as:  XANAX  Take 0.25-0.5 mg by mouth 2 (two) times daily as needed for anxiety or sleep.     amLODipine 5 MG tablet  Commonly known as:  NORVASC  Take 5 mg by mouth every morning.     atorvastatin 20 MG tablet  Commonly known as:  LIPITOR  Take 20 mg by mouth daily.     docusate sodium 100 MG capsule  Commonly known as:  COLACE  Take 1 capsule (100 mg total) by mouth 2 (two) times daily.     ferrous sulfate 325 (65 FE) MG tablet  Take 1 tablet (325 mg total) by mouth 3 (three) times daily after meals.     FLUoxetine 20 MG capsule  Commonly known as:  PROZAC  Take 20 mg by mouth every morning.     hydrochlorothiazide 25 MG tablet  Commonly known as:  HYDRODIURIL  Take 25 mg by mouth every morning.     levothyroxine 112 MCG tablet  Commonly known as:  SYNTHROID, LEVOTHROID  Take 112 mcg by mouth daily before breakfast.     losartan 100 MG tablet  Commonly known as:  COZAAR  Take 100 mg by mouth every morning.     metFORMIN 500 MG 24 hr tablet  Commonly known as:  GLUCOPHAGE-XR  Take 1,000 mg by mouth 2 (two) times  daily.     methocarbamol 500 MG tablet  Commonly known as:  ROBAXIN  Take 1 tablet (500 mg total) by mouth every 6 (six) hours as needed for muscle spasms.     omeprazole 20 MG tablet  Commonly known as:  PRILOSEC OTC  Take 20 mg by mouth daily.     oxyCODONE-acetaminophen 5-325 MG per tablet  Commonly known as:  PERCOCET/ROXICET  Take 1-2 tablets by mouth every 4 (four) hours as needed for moderate pain.     rivaroxaban 10 MG Tabs tablet  Commonly known as:  XARELTO  Take 1 tablet (10 mg total) by mouth daily with breakfast.     VICTOZA [place]  Inject 1.2 mg into the skin daily.           Follow-up Information    Follow up with GIOFFRE,RONALD A, MD. Schedule an appointment as soon as possible for a visit in 2 weeks.   Specialty:  Orthopedic Surgery   Contact information:   [address] [place] [address] [phone]       Signed: Ardeen Jourdain, PA-C Orthopaedic Surgery 12/10/2014, 7:32 AM

## 2015-11-23 DIAGNOSIS — E538 Deficiency of other specified B group vitamins: Secondary | ICD-10-CM | POA: Diagnosis not present

## 2015-12-13 DIAGNOSIS — M76829 Posterior tibial tendinitis, unspecified leg: Secondary | ICD-10-CM | POA: Insufficient documentation

## 2015-12-13 DIAGNOSIS — E782 Mixed hyperlipidemia: Secondary | ICD-10-CM

## 2015-12-13 DIAGNOSIS — M5136 Other intervertebral disc degeneration, lumbar region: Secondary | ICD-10-CM | POA: Insufficient documentation

## 2015-12-13 DIAGNOSIS — K219 Gastro-esophageal reflux disease without esophagitis: Secondary | ICD-10-CM | POA: Insufficient documentation

## 2015-12-13 DIAGNOSIS — Z79899 Other long term (current) drug therapy: Secondary | ICD-10-CM | POA: Insufficient documentation

## 2015-12-13 DIAGNOSIS — E1151 Type 2 diabetes mellitus with diabetic peripheral angiopathy without gangrene: Secondary | ICD-10-CM

## 2015-12-13 DIAGNOSIS — M51369 Other intervertebral disc degeneration, lumbar region without mention of lumbar back pain or lower extremity pain: Secondary | ICD-10-CM

## 2015-12-13 DIAGNOSIS — E538 Deficiency of other specified B group vitamins: Secondary | ICD-10-CM | POA: Insufficient documentation

## 2015-12-13 DIAGNOSIS — I10 Essential (primary) hypertension: Secondary | ICD-10-CM | POA: Insufficient documentation

## 2015-12-13 DIAGNOSIS — I7090 Unspecified atherosclerosis: Secondary | ICD-10-CM

## 2015-12-13 DIAGNOSIS — M159 Polyosteoarthritis, unspecified: Secondary | ICD-10-CM | POA: Insufficient documentation

## 2015-12-13 DIAGNOSIS — R5381 Other malaise: Secondary | ICD-10-CM

## 2015-12-13 DIAGNOSIS — F339 Major depressive disorder, recurrent, unspecified: Secondary | ICD-10-CM | POA: Insufficient documentation

## 2015-12-13 DIAGNOSIS — E039 Hypothyroidism, unspecified: Secondary | ICD-10-CM

## 2015-12-13 DIAGNOSIS — M150 Primary generalized (osteo)arthritis: Secondary | ICD-10-CM

## 2015-12-13 DIAGNOSIS — F5101 Primary insomnia: Secondary | ICD-10-CM | POA: Insufficient documentation

## 2015-12-13 DIAGNOSIS — R5383 Other fatigue: Secondary | ICD-10-CM

## 2015-12-13 HISTORY — DX: Other intervertebral disc degeneration, lumbar region: M51.36

## 2015-12-13 HISTORY — DX: Polyosteoarthritis, unspecified: M15.9

## 2015-12-13 HISTORY — DX: Deficiency of other specified B group vitamins: E53.8

## 2015-12-13 HISTORY — DX: Other intervertebral disc degeneration, lumbar region without mention of lumbar back pain or lower extremity pain: M51.369

## 2015-12-13 HISTORY — DX: Other malaise: R53.81

## 2015-12-13 HISTORY — DX: Primary insomnia: F51.01

## 2015-12-13 HISTORY — DX: Hypothyroidism, unspecified: E03.9

## 2015-12-13 HISTORY — DX: Posterior tibial tendinitis, unspecified leg: M76.829

## 2015-12-13 HISTORY — DX: Major depressive disorder, recurrent, unspecified: F33.9

## 2015-12-13 HISTORY — DX: Essential (primary) hypertension: I10

## 2015-12-13 HISTORY — DX: Other long term (current) drug therapy: Z79.899

## 2015-12-13 HISTORY — DX: Unspecified atherosclerosis: I70.90

## 2015-12-13 HISTORY — DX: Type 2 diabetes mellitus with diabetic peripheral angiopathy without gangrene: E11.51

## 2015-12-13 HISTORY — DX: Mixed hyperlipidemia: E78.2

## 2015-12-13 HISTORY — DX: Primary generalized (osteo)arthritis: M15.0

## 2015-12-13 HISTORY — DX: Gastro-esophageal reflux disease without esophagitis: K21.9

## 2015-12-14 DIAGNOSIS — M150 Primary generalized (osteo)arthritis: Secondary | ICD-10-CM | POA: Diagnosis not present

## 2015-12-14 DIAGNOSIS — Z79899 Other long term (current) drug therapy: Secondary | ICD-10-CM | POA: Diagnosis not present

## 2015-12-14 DIAGNOSIS — F331 Major depressive disorder, recurrent, moderate: Secondary | ICD-10-CM | POA: Diagnosis not present

## 2015-12-14 DIAGNOSIS — E1151 Type 2 diabetes mellitus with diabetic peripheral angiopathy without gangrene: Secondary | ICD-10-CM | POA: Diagnosis not present

## 2015-12-14 DIAGNOSIS — E669 Obesity, unspecified: Secondary | ICD-10-CM

## 2015-12-14 DIAGNOSIS — Z6833 Body mass index (BMI) 33.0-33.9, adult: Secondary | ICD-10-CM | POA: Diagnosis not present

## 2015-12-14 DIAGNOSIS — I10 Essential (primary) hypertension: Secondary | ICD-10-CM | POA: Diagnosis not present

## 2015-12-14 HISTORY — DX: Obesity, unspecified: E66.9

## 2016-01-04 DIAGNOSIS — E538 Deficiency of other specified B group vitamins: Secondary | ICD-10-CM | POA: Diagnosis not present

## 2016-01-11 DIAGNOSIS — E039 Hypothyroidism, unspecified: Secondary | ICD-10-CM | POA: Diagnosis not present

## 2016-01-11 DIAGNOSIS — Z6833 Body mass index (BMI) 33.0-33.9, adult: Secondary | ICD-10-CM | POA: Diagnosis not present

## 2016-01-11 DIAGNOSIS — R5381 Other malaise: Secondary | ICD-10-CM | POA: Diagnosis not present

## 2016-01-11 DIAGNOSIS — E669 Obesity, unspecified: Secondary | ICD-10-CM | POA: Diagnosis not present

## 2016-01-11 DIAGNOSIS — E782 Mixed hyperlipidemia: Secondary | ICD-10-CM | POA: Diagnosis not present

## 2016-01-11 DIAGNOSIS — E034 Atrophy of thyroid (acquired): Secondary | ICD-10-CM | POA: Diagnosis not present

## 2016-01-11 DIAGNOSIS — E1151 Type 2 diabetes mellitus with diabetic peripheral angiopathy without gangrene: Secondary | ICD-10-CM | POA: Diagnosis not present

## 2016-01-11 DIAGNOSIS — R5383 Other fatigue: Secondary | ICD-10-CM | POA: Diagnosis not present

## 2016-01-11 DIAGNOSIS — I10 Essential (primary) hypertension: Secondary | ICD-10-CM | POA: Diagnosis not present

## 2016-01-12 DIAGNOSIS — R0782 Intercostal pain: Secondary | ICD-10-CM | POA: Diagnosis not present

## 2016-01-12 DIAGNOSIS — R0789 Other chest pain: Secondary | ICD-10-CM | POA: Diagnosis not present

## 2016-01-17 DIAGNOSIS — Z96652 Presence of left artificial knee joint: Secondary | ICD-10-CM | POA: Diagnosis not present

## 2016-01-17 DIAGNOSIS — M1712 Unilateral primary osteoarthritis, left knee: Secondary | ICD-10-CM | POA: Diagnosis not present

## 2016-01-17 DIAGNOSIS — M7071 Other bursitis of hip, right hip: Secondary | ICD-10-CM | POA: Diagnosis not present

## 2016-01-17 DIAGNOSIS — Z471 Aftercare following joint replacement surgery: Secondary | ICD-10-CM | POA: Diagnosis not present

## 2016-02-21 DIAGNOSIS — F331 Major depressive disorder, recurrent, moderate: Secondary | ICD-10-CM | POA: Diagnosis not present

## 2016-02-21 DIAGNOSIS — E782 Mixed hyperlipidemia: Secondary | ICD-10-CM | POA: Diagnosis not present

## 2016-02-21 DIAGNOSIS — I10 Essential (primary) hypertension: Secondary | ICD-10-CM | POA: Diagnosis not present

## 2016-02-21 DIAGNOSIS — E538 Deficiency of other specified B group vitamins: Secondary | ICD-10-CM | POA: Diagnosis not present

## 2016-02-21 DIAGNOSIS — E039 Hypothyroidism, unspecified: Secondary | ICD-10-CM | POA: Diagnosis not present

## 2016-02-21 DIAGNOSIS — E669 Obesity, unspecified: Secondary | ICD-10-CM | POA: Diagnosis not present

## 2016-02-21 DIAGNOSIS — E1151 Type 2 diabetes mellitus with diabetic peripheral angiopathy without gangrene: Secondary | ICD-10-CM | POA: Diagnosis not present

## 2016-02-21 DIAGNOSIS — M150 Primary generalized (osteo)arthritis: Secondary | ICD-10-CM | POA: Diagnosis not present

## 2016-02-21 DIAGNOSIS — Z780 Asymptomatic menopausal state: Secondary | ICD-10-CM

## 2016-02-21 DIAGNOSIS — Z79899 Other long term (current) drug therapy: Secondary | ICD-10-CM | POA: Diagnosis not present

## 2016-02-21 DIAGNOSIS — M5136 Other intervertebral disc degeneration, lumbar region: Secondary | ICD-10-CM | POA: Diagnosis not present

## 2016-02-21 DIAGNOSIS — K219 Gastro-esophageal reflux disease without esophagitis: Secondary | ICD-10-CM | POA: Diagnosis not present

## 2016-02-21 DIAGNOSIS — Z0000 Encounter for general adult medical examination without abnormal findings: Secondary | ICD-10-CM | POA: Diagnosis not present

## 2016-02-21 DIAGNOSIS — I7090 Unspecified atherosclerosis: Secondary | ICD-10-CM | POA: Diagnosis not present

## 2016-02-21 HISTORY — DX: Asymptomatic menopausal state: Z78.0

## 2016-02-22 DIAGNOSIS — H26493 Other secondary cataract, bilateral: Secondary | ICD-10-CM | POA: Diagnosis not present

## 2016-02-22 DIAGNOSIS — M7061 Trochanteric bursitis, right hip: Secondary | ICD-10-CM | POA: Diagnosis not present

## 2016-02-22 DIAGNOSIS — H524 Presbyopia: Secondary | ICD-10-CM | POA: Diagnosis not present

## 2016-02-22 DIAGNOSIS — E119 Type 2 diabetes mellitus without complications: Secondary | ICD-10-CM | POA: Diagnosis not present

## 2016-02-24 DIAGNOSIS — L578 Other skin changes due to chronic exposure to nonionizing radiation: Secondary | ICD-10-CM | POA: Diagnosis not present

## 2016-02-24 DIAGNOSIS — L821 Other seborrheic keratosis: Secondary | ICD-10-CM | POA: Diagnosis not present

## 2016-02-24 DIAGNOSIS — L570 Actinic keratosis: Secondary | ICD-10-CM | POA: Diagnosis not present

## 2016-02-24 DIAGNOSIS — C44519 Basal cell carcinoma of skin of other part of trunk: Secondary | ICD-10-CM | POA: Diagnosis not present

## 2016-02-29 DIAGNOSIS — Z780 Asymptomatic menopausal state: Secondary | ICD-10-CM | POA: Diagnosis not present

## 2016-02-29 DIAGNOSIS — C44519 Basal cell carcinoma of skin of other part of trunk: Secondary | ICD-10-CM | POA: Diagnosis not present

## 2016-02-29 DIAGNOSIS — Z13820 Encounter for screening for osteoporosis: Secondary | ICD-10-CM | POA: Diagnosis not present

## 2016-03-20 DIAGNOSIS — E538 Deficiency of other specified B group vitamins: Secondary | ICD-10-CM | POA: Diagnosis not present

## 2016-05-02 DIAGNOSIS — E538 Deficiency of other specified B group vitamins: Secondary | ICD-10-CM | POA: Diagnosis not present

## 2016-06-02 DIAGNOSIS — E538 Deficiency of other specified B group vitamins: Secondary | ICD-10-CM | POA: Diagnosis not present

## 2016-07-11 DIAGNOSIS — E538 Deficiency of other specified B group vitamins: Secondary | ICD-10-CM | POA: Diagnosis not present

## 2016-07-19 DIAGNOSIS — Z23 Encounter for immunization: Secondary | ICD-10-CM | POA: Diagnosis not present

## 2016-08-04 DIAGNOSIS — E538 Deficiency of other specified B group vitamins: Secondary | ICD-10-CM | POA: Diagnosis not present

## 2016-08-07 DIAGNOSIS — M7061 Trochanteric bursitis, right hip: Secondary | ICD-10-CM | POA: Diagnosis not present

## 2016-09-05 DIAGNOSIS — K219 Gastro-esophageal reflux disease without esophagitis: Secondary | ICD-10-CM | POA: Diagnosis not present

## 2016-09-05 DIAGNOSIS — E538 Deficiency of other specified B group vitamins: Secondary | ICD-10-CM | POA: Diagnosis not present

## 2016-09-05 DIAGNOSIS — F331 Major depressive disorder, recurrent, moderate: Secondary | ICD-10-CM | POA: Diagnosis not present

## 2016-09-05 DIAGNOSIS — R5381 Other malaise: Secondary | ICD-10-CM | POA: Diagnosis not present

## 2016-09-05 DIAGNOSIS — E1151 Type 2 diabetes mellitus with diabetic peripheral angiopathy without gangrene: Secondary | ICD-10-CM | POA: Diagnosis not present

## 2016-09-05 DIAGNOSIS — E782 Mixed hyperlipidemia: Secondary | ICD-10-CM | POA: Diagnosis not present

## 2016-09-05 DIAGNOSIS — F5101 Primary insomnia: Secondary | ICD-10-CM | POA: Diagnosis not present

## 2016-09-05 DIAGNOSIS — Z79899 Other long term (current) drug therapy: Secondary | ICD-10-CM | POA: Diagnosis not present

## 2016-09-05 DIAGNOSIS — M150 Primary generalized (osteo)arthritis: Secondary | ICD-10-CM | POA: Diagnosis not present

## 2016-09-05 DIAGNOSIS — M5136 Other intervertebral disc degeneration, lumbar region: Secondary | ICD-10-CM | POA: Diagnosis not present

## 2016-09-05 DIAGNOSIS — E039 Hypothyroidism, unspecified: Secondary | ICD-10-CM | POA: Diagnosis not present

## 2016-09-05 DIAGNOSIS — I7090 Unspecified atherosclerosis: Secondary | ICD-10-CM | POA: Diagnosis not present

## 2016-09-05 DIAGNOSIS — I10 Essential (primary) hypertension: Secondary | ICD-10-CM | POA: Diagnosis not present

## 2016-10-11 DIAGNOSIS — M7061 Trochanteric bursitis, right hip: Secondary | ICD-10-CM | POA: Diagnosis not present

## 2016-10-11 DIAGNOSIS — M19012 Primary osteoarthritis, left shoulder: Secondary | ICD-10-CM | POA: Diagnosis not present

## 2016-10-17 DIAGNOSIS — E538 Deficiency of other specified B group vitamins: Secondary | ICD-10-CM | POA: Diagnosis not present

## 2016-10-18 DIAGNOSIS — Z853 Personal history of malignant neoplasm of breast: Secondary | ICD-10-CM | POA: Diagnosis not present

## 2016-10-18 DIAGNOSIS — Z1231 Encounter for screening mammogram for malignant neoplasm of breast: Secondary | ICD-10-CM | POA: Diagnosis not present

## 2016-10-29 DIAGNOSIS — J111 Influenza due to unidentified influenza virus with other respiratory manifestations: Secondary | ICD-10-CM | POA: Diagnosis not present

## 2016-11-14 DIAGNOSIS — E538 Deficiency of other specified B group vitamins: Secondary | ICD-10-CM | POA: Diagnosis not present

## 2016-12-14 DIAGNOSIS — E538 Deficiency of other specified B group vitamins: Secondary | ICD-10-CM | POA: Diagnosis not present

## 2016-12-15 DIAGNOSIS — M7061 Trochanteric bursitis, right hip: Secondary | ICD-10-CM | POA: Diagnosis not present

## 2017-01-17 DIAGNOSIS — E538 Deficiency of other specified B group vitamins: Secondary | ICD-10-CM | POA: Diagnosis not present

## 2017-02-01 DIAGNOSIS — M7061 Trochanteric bursitis, right hip: Secondary | ICD-10-CM | POA: Diagnosis not present

## 2017-02-19 DIAGNOSIS — E119 Type 2 diabetes mellitus without complications: Secondary | ICD-10-CM | POA: Diagnosis not present

## 2017-02-26 DIAGNOSIS — M7061 Trochanteric bursitis, right hip: Secondary | ICD-10-CM | POA: Diagnosis not present

## 2017-02-28 DIAGNOSIS — E538 Deficiency of other specified B group vitamins: Secondary | ICD-10-CM | POA: Diagnosis not present

## 2017-03-05 DIAGNOSIS — M7061 Trochanteric bursitis, right hip: Secondary | ICD-10-CM | POA: Diagnosis not present

## 2017-03-16 DIAGNOSIS — M7061 Trochanteric bursitis, right hip: Secondary | ICD-10-CM | POA: Diagnosis not present

## 2017-03-21 DIAGNOSIS — K219 Gastro-esophageal reflux disease without esophagitis: Secondary | ICD-10-CM | POA: Diagnosis not present

## 2017-03-21 DIAGNOSIS — I7090 Unspecified atherosclerosis: Secondary | ICD-10-CM | POA: Diagnosis not present

## 2017-03-21 DIAGNOSIS — E1151 Type 2 diabetes mellitus with diabetic peripheral angiopathy without gangrene: Secondary | ICD-10-CM | POA: Diagnosis not present

## 2017-03-21 DIAGNOSIS — E039 Hypothyroidism, unspecified: Secondary | ICD-10-CM | POA: Diagnosis not present

## 2017-03-21 DIAGNOSIS — Z79899 Other long term (current) drug therapy: Secondary | ICD-10-CM | POA: Diagnosis not present

## 2017-03-21 DIAGNOSIS — R5381 Other malaise: Secondary | ICD-10-CM | POA: Diagnosis not present

## 2017-03-21 DIAGNOSIS — M150 Primary generalized (osteo)arthritis: Secondary | ICD-10-CM | POA: Diagnosis not present

## 2017-03-21 DIAGNOSIS — S76011A Strain of muscle, fascia and tendon of right hip, initial encounter: Secondary | ICD-10-CM | POA: Diagnosis not present

## 2017-03-21 DIAGNOSIS — M7061 Trochanteric bursitis, right hip: Secondary | ICD-10-CM | POA: Diagnosis not present

## 2017-03-21 DIAGNOSIS — E538 Deficiency of other specified B group vitamins: Secondary | ICD-10-CM | POA: Diagnosis not present

## 2017-03-21 DIAGNOSIS — L02429 Furuncle of limb, unspecified: Secondary | ICD-10-CM | POA: Diagnosis not present

## 2017-03-21 DIAGNOSIS — R5383 Other fatigue: Secondary | ICD-10-CM | POA: Diagnosis not present

## 2017-03-21 DIAGNOSIS — E669 Obesity, unspecified: Secondary | ICD-10-CM | POA: Diagnosis not present

## 2017-04-02 DIAGNOSIS — M2141 Flat foot [pes planus] (acquired), right foot: Secondary | ICD-10-CM | POA: Diagnosis not present

## 2017-04-02 DIAGNOSIS — R29898 Other symptoms and signs involving the musculoskeletal system: Secondary | ICD-10-CM | POA: Diagnosis not present

## 2017-04-02 DIAGNOSIS — S76011D Strain of muscle, fascia and tendon of right hip, subsequent encounter: Secondary | ICD-10-CM | POA: Diagnosis not present

## 2017-04-04 ENCOUNTER — Other Ambulatory Visit: Payer: Self-pay | Admitting: Orthopedic Surgery

## 2017-04-04 DIAGNOSIS — R29898 Other symptoms and signs involving the musculoskeletal system: Secondary | ICD-10-CM

## 2017-04-06 ENCOUNTER — Encounter (HOSPITAL_COMMUNITY): Admission: RE | Admit: 2017-04-06 | Payer: PPO | Source: Ambulatory Visit

## 2017-04-11 ENCOUNTER — Encounter (HOSPITAL_COMMUNITY): Admission: RE | Payer: Self-pay | Source: Ambulatory Visit

## 2017-04-11 ENCOUNTER — Ambulatory Visit (HOSPITAL_COMMUNITY): Admission: RE | Admit: 2017-04-11 | Payer: PPO | Source: Ambulatory Visit | Admitting: Orthopedic Surgery

## 2017-04-11 SURGERY — REPAIR, TENDON, GLUTEUS MEDIUS, OPEN
Anesthesia: General | Laterality: Right

## 2017-04-13 ENCOUNTER — Ambulatory Visit
Admission: RE | Admit: 2017-04-13 | Discharge: 2017-04-13 | Disposition: A | Discharge status: Home / Self Care | Payer: PPO | Source: Ambulatory Visit | Attending: Orthopedic Surgery | Admitting: Orthopedic Surgery

## 2017-04-13 DIAGNOSIS — M545 Low back pain: Secondary | ICD-10-CM | POA: Diagnosis not present

## 2017-04-13 DIAGNOSIS — R29898 Other symptoms and signs involving the musculoskeletal system: Secondary | ICD-10-CM

## 2017-04-13 MED ORDER — MEPERIDINE HCL 100 MG/ML IJ SOLN
75.0000 mg | Freq: Once | INTRAMUSCULAR | Status: AC
  Administered 2017-04-13: 75 mg via INTRAMUSCULAR

## 2017-04-13 MED ORDER — DIAZEPAM 5 MG PO TABS
5.0000 mg | ORAL_TABLET | Freq: Once | ORAL | Status: AC
  Administered 2017-04-13: 5 mg via ORAL

## 2017-04-13 MED ORDER — ONDANSETRON HCL 4 MG/2ML IJ SOLN
4.0000 mg | Freq: Once | INTRAMUSCULAR | Status: AC
  Administered 2017-04-13: 4 mg via INTRAMUSCULAR

## 2017-04-13 MED ORDER — IOPAMIDOL (ISOVUE-M 200) INJECTION 41%
15.0000 mL | Freq: Once | INTRAMUSCULAR | Status: AC
  Administered 2017-04-13: 15 mL via INTRATHECAL

## 2017-04-13 NOTE — Discharge Instructions (Signed)
Myelogram Discharge Instructions  1. Go home and rest quietly for the next 24 hours.  It is important to lie flat for the next 24 hours.  Get up only to go to the restroom.  You may lie in the bed or on a couch on your back, your stomach, your left side or your right side.  You may have one pillow under your head.  You may have pillows between your knees while you are on your side or under your knees while you are on your back.  2. DO NOT drive today.  Recline the seat as far back as it will go, while still wearing your seat belt, on the way home.  3. You may get up to go to the bathroom as needed.  You may sit up for 10 minutes to eat.  You may resume your normal diet and medications unless otherwise indicated.  Drink lots of extra fluids today and tomorrow.  4. The incidence of headache, nausea, or vomiting is about 5% (one in 20 patients).  If you develop a headache, lie flat and drink plenty of fluids until the headache goes away.  Caffeinated beverages may be helpful.  If you develop severe nausea and vomiting or a headache that does not go away with flat bed rest, call [phone].  5. You may resume normal activities after your 24 hours of bed rest is over; however, do not exert yourself strongly or do any heavy lifting tomorrow. If when you get up you have a headache when standing, go back to bed and force fluids for another 24 hours.  6. Call your physician for a follow-up appointment.  The results of your myelogram will be sent directly to your physician by the following day.  7. If you have any questions or if complications develop after you arrive home, please call [phone].  Discharge instructions have been explained to the patient.  The patient, or the person responsible for the patient, fully understands these instructions.       May resume Prozac and Tramadol on April 14, 2017, after 10:30 am.

## 2017-04-13 NOTE — Progress Notes (Signed)
Patient states she has been off Prozac and Tramadol for at least the past two days.  Brita Romp, RN

## 2017-04-17 DIAGNOSIS — R29898 Other symptoms and signs involving the musculoskeletal system: Secondary | ICD-10-CM | POA: Diagnosis not present

## 2017-04-17 DIAGNOSIS — M4807 Spinal stenosis, lumbosacral region: Secondary | ICD-10-CM | POA: Diagnosis not present

## 2017-04-17 DIAGNOSIS — M2141 Flat foot [pes planus] (acquired), right foot: Secondary | ICD-10-CM | POA: Diagnosis not present

## 2017-04-20 DIAGNOSIS — E538 Deficiency of other specified B group vitamins: Secondary | ICD-10-CM | POA: Diagnosis not present

## 2017-05-04 DIAGNOSIS — S76011D Strain of muscle, fascia and tendon of right hip, subsequent encounter: Secondary | ICD-10-CM | POA: Diagnosis not present

## 2017-05-04 DIAGNOSIS — R29898 Other symptoms and signs involving the musculoskeletal system: Secondary | ICD-10-CM | POA: Diagnosis not present

## 2017-05-04 DIAGNOSIS — M7061 Trochanteric bursitis, right hip: Secondary | ICD-10-CM | POA: Diagnosis not present

## 2017-05-19 DIAGNOSIS — M4807 Spinal stenosis, lumbosacral region: Secondary | ICD-10-CM | POA: Diagnosis not present

## 2017-05-19 DIAGNOSIS — M7061 Trochanteric bursitis, right hip: Secondary | ICD-10-CM | POA: Diagnosis not present

## 2017-05-19 DIAGNOSIS — R29898 Other symptoms and signs involving the musculoskeletal system: Secondary | ICD-10-CM | POA: Diagnosis not present

## 2017-05-19 DIAGNOSIS — S76011D Strain of muscle, fascia and tendon of right hip, subsequent encounter: Secondary | ICD-10-CM | POA: Diagnosis not present

## 2017-06-12 DIAGNOSIS — E039 Hypothyroidism, unspecified: Secondary | ICD-10-CM | POA: Diagnosis not present

## 2017-06-12 DIAGNOSIS — I7090 Unspecified atherosclerosis: Secondary | ICD-10-CM | POA: Diagnosis not present

## 2017-06-12 DIAGNOSIS — I10 Essential (primary) hypertension: Secondary | ICD-10-CM | POA: Diagnosis not present

## 2017-06-12 DIAGNOSIS — E1151 Type 2 diabetes mellitus with diabetic peripheral angiopathy without gangrene: Secondary | ICD-10-CM | POA: Diagnosis not present

## 2017-06-12 DIAGNOSIS — E538 Deficiency of other specified B group vitamins: Secondary | ICD-10-CM | POA: Diagnosis not present

## 2017-06-19 DIAGNOSIS — M4807 Spinal stenosis, lumbosacral region: Secondary | ICD-10-CM | POA: Diagnosis not present

## 2017-07-03 DIAGNOSIS — M1812 Unilateral primary osteoarthritis of first carpometacarpal joint, left hand: Secondary | ICD-10-CM | POA: Diagnosis not present

## 2017-07-03 DIAGNOSIS — R29898 Other symptoms and signs involving the musculoskeletal system: Secondary | ICD-10-CM | POA: Diagnosis not present

## 2017-07-03 DIAGNOSIS — M7061 Trochanteric bursitis, right hip: Secondary | ICD-10-CM | POA: Diagnosis not present

## 2017-07-03 DIAGNOSIS — M4807 Spinal stenosis, lumbosacral region: Secondary | ICD-10-CM | POA: Diagnosis not present

## 2017-07-17 DIAGNOSIS — M25512 Pain in left shoulder: Secondary | ICD-10-CM | POA: Diagnosis not present

## 2017-07-24 DIAGNOSIS — Z23 Encounter for immunization: Secondary | ICD-10-CM | POA: Diagnosis not present

## 2017-07-24 DIAGNOSIS — E538 Deficiency of other specified B group vitamins: Secondary | ICD-10-CM | POA: Diagnosis not present

## 2017-09-05 DIAGNOSIS — E538 Deficiency of other specified B group vitamins: Secondary | ICD-10-CM | POA: Diagnosis not present

## 2017-10-11 DIAGNOSIS — Z0000 Encounter for general adult medical examination without abnormal findings: Secondary | ICD-10-CM | POA: Diagnosis not present

## 2017-10-11 DIAGNOSIS — R5381 Other malaise: Secondary | ICD-10-CM | POA: Diagnosis not present

## 2017-10-11 DIAGNOSIS — E538 Deficiency of other specified B group vitamins: Secondary | ICD-10-CM | POA: Diagnosis not present

## 2017-10-11 DIAGNOSIS — K219 Gastro-esophageal reflux disease without esophagitis: Secondary | ICD-10-CM | POA: Diagnosis not present

## 2017-10-11 DIAGNOSIS — E1151 Type 2 diabetes mellitus with diabetic peripheral angiopathy without gangrene: Secondary | ICD-10-CM | POA: Diagnosis not present

## 2017-10-11 DIAGNOSIS — E039 Hypothyroidism, unspecified: Secondary | ICD-10-CM | POA: Diagnosis not present

## 2017-10-11 DIAGNOSIS — R5383 Other fatigue: Secondary | ICD-10-CM | POA: Diagnosis not present

## 2017-10-11 DIAGNOSIS — I10 Essential (primary) hypertension: Secondary | ICD-10-CM | POA: Diagnosis not present

## 2018-01-24 ENCOUNTER — Inpatient Hospital Stay: Payer: Medicare HMO | Attending: Gynecologic Oncology | Admitting: Gynecologic Oncology

## 2018-01-24 ENCOUNTER — Encounter: Payer: Self-pay | Admitting: Gynecologic Oncology

## 2018-01-24 VITALS — BP 138/52 | HR 72 | Temp 97.7°F | Resp 18 | Ht 64.5 in | Wt 184.8 lb

## 2018-01-24 DIAGNOSIS — N83209 Unspecified ovarian cyst, unspecified side: Secondary | ICD-10-CM | POA: Diagnosis not present

## 2018-01-24 DIAGNOSIS — R911 Solitary pulmonary nodule: Secondary | ICD-10-CM | POA: Insufficient documentation

## 2018-01-24 DIAGNOSIS — N83299 Other ovarian cyst, unspecified side: Secondary | ICD-10-CM

## 2018-01-24 DIAGNOSIS — C562 Malignant neoplasm of left ovary: Secondary | ICD-10-CM | POA: Insufficient documentation

## 2018-01-24 DIAGNOSIS — Z90710 Acquired absence of both cervix and uterus: Secondary | ICD-10-CM | POA: Diagnosis not present

## 2018-01-24 DIAGNOSIS — R350 Frequency of micturition: Secondary | ICD-10-CM | POA: Diagnosis not present

## 2018-01-24 DIAGNOSIS — Z90722 Acquired absence of ovaries, bilateral: Secondary | ICD-10-CM | POA: Insufficient documentation

## 2018-01-24 NOTE — Progress Notes (Signed)
Consult Note: Gyn-Onc  Consult was requested by Dr. Elly Modena for the evaluation of [name] 77 y.o. female  CC:  Chief Complaint  Patient presents with  . Complex ovarian cyst    Assessment/Plan:  [name] is a 77 y.o. with an 8 cm complex pelvic mass and normal CA 125, no family history of a second-degree relative with breast cancer in first-degree relative with bladder cancer.  She has a history of hysterectomy in 1984.  Recommendations were for bilateral salpingo-oophorectomy with staging and debulking based on operative findings.  I discussed the benefits of a minimally invasive approach which include short hospitalization with her return to function.  She is aware that if there is intraoperative adhesive disease or other intraoperative processes that may this procedure safe for by a laparotomy that would be performed.  She was counseled for biotic assisted bilateral salpingo-oophorectomy possible omentectomy lymph node dissection biopsies debulking and other indicated procedures including laparotomy.  Risks of the procedures discussed were infection bleeding damage to surrounding structures prolonged hospitalization and reoperation and DVT.   Procedures been scheduled with Dr. Gerarda Fraction on January 31, 2018.  All of the questions and those of her daughter also were answered to her satisfaction   HPI: [name] is a 77 y.o. for the 2 para 2 with abnormalities in gait that prompted an MRI of the right hip.  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  She then reported left-sided back pain Pelvic ultrasound on January 02, 2018 is notable for left adnexa measuring 8.1 x 6.6 x 5.9 cm complex partially cystic with solid components.  Blood flow was noted within the solid portion of the mass. Ca1 25 returned a value of 11.1.  Family history is notable for a niece with a diagnosis of  breast cancer at age 44.  She also reports that her sister treated for bladder cancer at the age of 37.  His Reports constipation with intermittent diarrhea.  She denies abdominal bloating or weight gain.  There is no bleeding of the rectum bladder or vagina or cough.  Last mammogram was in March 2019 within normal limits and colonoscopy in 2017 was notable for atypical polyps which were removed.    Review of Systems:  Constitutional  Feels well,  Cardiovascular  No chest pain, shortness of breath, or edema  Pulmonary  No cough or wheeze.  Gastro Intestinal  No nausea, vomitting, or diarrhoea. No bright red blood per rectum, no abdominal pain, change in bowel movement, reports new constipation occasional diarrhea Genito Urinary  No frequency, urgency, dysuria, ports occasional incontinence with cough  musculo Skeletal  No myalgia, arthralgia, joint swelling or pain ports lower back pain Neurologic  Normal gait with flare of right lower extremity with ambulation presumed to be secondary to complete tear of the tendon of the right gluteus medius Psychology  No depression, anxiety, insomnia.    Current Meds:  Outpatient Encounter Medications as of 01/24/2018  Medication Sig  . ALPRAZolam (XANAX) 0.5 MG tablet Take 0.25-0.5 mg by mouth 2 (two) times daily as needed for anxiety or sleep.   Marland Kitchen amLODipine (NORVASC) 5 MG tablet Take 5 mg by mouth every morning.  Marland Kitchen atorvastatin (LIPITOR) 20 MG tablet Take 20 mg by mouth daily.  . cholecalciferol (VITAMIN D) 1000 units tablet Take 20,000 Units by mouth daily.  . Cyanocobalamin (B-12 COMPLIANCE INJECTION)  1000 MCG/ML KIT Inject 1,000 mcg as directed every 30 (thirty) days.  Marland Kitchen exenatide (BYETTA) 10 MCG/0.04ML SOPN injection Inject into the skin.  Marland Kitchen FLUoxetine (PROZAC) 20 MG capsule Take 20 mg by mouth every morning.  . hydrochlorothiazide (HYDRODIURIL) 25 MG tablet Take 25 mg by mouth every morning.  Marland Kitchen levothyroxine (SYNTHROID, LEVOTHROID) 112  MCG tablet Take 112 mcg by mouth daily before breakfast.  . Liraglutide (VICTOZA [place]) Inject 1.2 mg into the skin daily.  Marland Kitchen losartan (COZAAR) 100 MG tablet Take 100 mg by mouth every morning.  . meloxicam (MOBIC) 15 MG tablet TAKE 1 TABLET BY MOUTH ONCE (1) DAILY AFTER A MEAL  . metFORMIN (GLUCOPHAGE-XR) 500 MG 24 hr tablet Take 1,000 mg by mouth 2 (two) times daily.  Marland Kitchen omeprazole (PRILOSEC OTC) 20 MG tablet Take 20 mg by mouth daily.  . potassium chloride (K-DUR,KLOR-CON) 10 MEQ tablet Take by mouth.  . traMADol (ULTRAM) 50 MG tablet TAKE 1 TABLET BY MOUTH EVERY 4 TO 6 HOURS AS NEEDED FOR PAIN  . tiZANidine (ZANAFLEX) 4 MG tablet tizanidine 4 mg tablet  TAKE 1 TABLET BY MOUTH THREE (3) TIMES DAILY AS NEEDED  . [DISCONTINUED] docusate sodium (COLACE) 100 MG capsule Take 1 capsule (100 mg total) by mouth 2 (two) times daily.  . [DISCONTINUED] ferrous sulfate 325 (65 FE) MG tablet Take 1 tablet (325 mg total) by mouth 3 (three) times daily after meals. (Patient not taking: Reported on 04/04/2017)  . [DISCONTINUED] methocarbamol (ROBAXIN) 500 MG tablet Take 1 tablet (500 mg total) by mouth every 6 (six) hours as needed for muscle spasms. (Patient not taking: Reported on 04/04/2017)   No facility-administered encounter medications on file as of 01/24/2018.     Allergy:  Allergies  Allergen Reactions  . Penicillins Anaphylaxis    Social Hx:   Social History   Socioeconomic History  . Marital status: Married    Spouse name: Not on file  . Number of children: Not on file  . Years of education: Not on file  . Highest education level: Not on file  Occupational History  . Not on file  Social Needs  . Financial resource strain: Not on file  . Food insecurity:    Worry: Not on file    Inability: Not on file  . Transportation needs:    Medical: Not on file    Non-medical: Not on file  Tobacco Use  . Smoking status: Never Smoker  . Smokeless tobacco: Never Used  Substance and Sexual  Activity  . Alcohol use: No  . Drug use: No  . Sexual activity: Not on file  Lifestyle  . Physical activity:    Days per week: Not on file    Minutes per session: Not on file  . Stress: Not on file  Relationships  . Social connections:    Talks on phone: Not on file    Gets together: Not on file    Attends religious service: Not on file    Active member of club or organization: Not on file    Attends meetings of clubs or organizations: Not on file    Relationship status: Not on file  . Intimate partner violence:    Fear of current or ex partner: Not on file    Emotionally abused: Not on file    Physically abused: Not on file    Forced sexual activity: Not on file  Other Topics Concern  . Not on file  Social History Narrative  .  Not on file    Past Surgical Hx:  Past Surgical History:  Procedure Laterality Date  . ABDOMINAL HYSTERECTOMY    . BLADDER SUSPENSION    . BREAST SURGERY     several benign cysts removed  . CHOLECYSTECTOMY    . EYE SURGERY     cataracts removed  . KNEE ARTHROSCOPY     l knee x2  . PARATHYROIDECTOMY    . ROTATOR CUFF REPAIR    . 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 Arthritis   . Complication of anesthesia   . Diabetes mellitus without complication ([place])   . Difficulty sleeping   . GERD (gastroesophageal reflux disease)   . Hypertension   . Hypothyroidism   . PONV (postoperative nausea and vomiting)   . Vitamin B 12 deficiency     Past Gynecological History: 2 prior to hysterectomy in 1984 received estrogen therapy from 1984 until 2000 No LMP recorded. Patient has had a hysterectomy.  Family Hx:  Family History  Problem Relation Age of Onset  . Lymphoma Mother   . Bladder Cancer Sister   . Prostate cancer Brother   . Leukemia  Brother   . Breast cancer Other     Vitals:  Blood pressure (!) 138/52, pulse 72, temperature 97.7 F (36.5 C), temperature source Oral, resp. rate 18, height 5' 4.5" (1.638 m), weight 184 lb 12.8 oz (83.8 kg), SpO2 97 %.  Physical Exam: WD in NAD Neck  Supple NROM, without any enlargements.  Lymph Node Survey No cervical supraclavicular or inguinal adenopathy Cardiovascular  Pulse normal rate, regularity and rhythm.  Lungs  Clear to auscultation bilaterally,  Good air movement.  Skin  No rash/lesions/breakdown  Psychiatry  Alert and oriented appropriate mood affect speech and reasoning. Abdomen  Normoactive bowel sounds, abdomen soft, non-tender.  Right upper quadrant surgical  site intact without evidence of hernia.  No fluid wave no palpable masses Back No CVA tenderness Genito Urinary  Vulva/vagina: Normal external female genitalia.  No lesions. No discharge or bleeding.  Bladder/urethra:  No lesions or masses  Vagina no masses palpable    Adnexa: Masses assessed on vaginal examination Rectal  Good tone, no masses no cul de sac nodularity, soft mass palpable on rectal examination within the pelvis Extremities  No bilateral cyanosis, clubbing or edema.   Janie Morning, MD, PhD 01/24/2018, 10:27 AM

## 2018-01-24 NOTE — Patient Instructions (Addendum)
Preparing for your Surgery  Plan for surgery on January 31, 2018 with Dr. Precious Haws at [place] will be scheduled for a robotic assisted bilateral salpingo-oophorectomy, possible staging.  Pre-operative Testing -You will receive a phone call from presurgical testing at [facility] to arrange for a pre-operative testing appointment before your surgery.  This appointment normally occurs one to two weeks before your scheduled surgery.   -Bring your insurance card, copy of an advanced directive if applicable, medication list  -At that visit, you will be asked to sign a consent for a possible blood transfusion in case a transfusion becomes necessary during surgery.  The need for a blood transfusion is rare but having consent is a necessary part of your care.     -You should not be taking blood thinners or aspirin at least ten days prior to surgery unless instructed by your surgeon.  Day Before Surgery at [facility] will be asked to take in a light diet the day before surgery.  Avoid carbonated beverages.  You will be advised to have nothing to eat or drink after midnight the evening before.    Eat a light diet the day before surgery.  Examples including soups, broths, toast, yogurt, mashed potatoes.  Things to avoid include carbonated beverages (fizzy beverages), raw fruits and raw vegetables, or beans.   If your bowels are filled with gas, your surgeon will have difficulty visualizing your pelvic organs which increases your surgical risks.  Your role in recovery Your role is to become active as soon as directed by your doctor, while still giving yourself time to heal.  Rest when you feel tired. You will be asked to do the following in order to speed your recovery:  - Cough and breathe deeply. This helps toclear and expand your lungs and can prevent pneumonia. You may be given a spirometer to practice deep breathing. A staff member will show you how to use  the spirometer. - Do mild physical activity. Walking or moving your legs help your circulation and body functions return to normal. A staff member will help you when you try to walk and will provide you with simple exercises. Do not try to get up or walk alone the first time. - Actively manage your pain. Managing your pain lets you move in comfort. We will ask you to rate your pain on a scale of zero to 10. It is your responsibility to tell your doctor or nurse where and how much you hurt so your pain can be treated.  Special Considerations -If you are diabetic, you may be placed on insulin after surgery to have closer control over your blood sugars to promote healing and recovery.  This does not mean that you will be discharged on insulin.  If applicable, your oral antidiabetics will be resumed when you are tolerating a solid diet.  -Your final pathology results from surgery should be available by the Friday after surgery and the results will be relayed to you when available.  -Dr. Lahoma Crocker is the Surgeon that assists your GYN Oncologist with surgery.  The next day after your surgery you will either see your GYN Oncologist or Dr. Lahoma Crocker.   Blood Transfusion Information WHAT IS A BLOOD TRANSFUSION? A transfusion is the replacement of blood or some of its parts. Blood is made up of multiple cells which provide different functions.  Red blood cells carry oxygen and are used for blood loss replacement.  White  blood cells fight against infection.  Platelets control bleeding.  Plasma helps clot blood.  Other blood products are available for specialized needs, such as hemophilia or other clotting disorders. BEFORE THE TRANSFUSION  Who gives blood for transfusions?   You may be able to donate blood to be used at a later date on yourself (autologous donation).  Relatives can be asked to donate blood. This is generally not any safer than if you have received blood from a  stranger. The same precautions are taken to ensure safety when a relative's blood is donated.  Healthy volunteers who are fully evaluated to make sure their blood is safe. This is blood bank blood. Transfusion therapy is the safest it has ever been in the practice of medicine. Before blood is taken from a donor, a complete history is taken to make sure that person has no history of diseases nor engages in risky social behavior (examples are intravenous drug use or sexual activity with multiple partners). The donor's travel history is screened to minimize risk of transmitting infections, such as malaria. The donated blood is tested for signs of infectious diseases, such as HIV and hepatitis. The blood is then tested to be sure it is compatible with you in order to minimize the chance of a transfusion reaction. If you or a relative donates blood, this is often done in anticipation of surgery and is not appropriate for emergency situations. It takes many days to process the donated blood. RISKS AND COMPLICATIONS Although transfusion therapy is very safe and saves many lives, the main dangers of transfusion include:   Getting an infectious disease.  Developing a transfusion reaction. This is an allergic reaction to something in the blood you were given. Every precaution is taken to prevent this. The decision to have a blood transfusion has been considered carefully by your caregiver before blood is given. Blood is not given unless the benefits outweigh the risks.

## 2018-01-25 NOTE — Progress Notes (Signed)
CBC, CMET, HGBA1C, LIPID PANEL 01-15-18 , SEE [facility] CARE EVERYWHERE

## 2018-01-25 NOTE — Patient Instructions (Addendum)
[name]  01/25/2018   Your procedure is scheduled on: 01-31-18  Report to [facility] Main  Entrance    Report to admitting at 5:30 AM    Call this number if you have problems the morning of surgery [phone]     Remember: Eat a light diet the day before surgery.  Examples including soups, broths, toast, yogurt, mashed potatoes.  Things to avoid include carbonated beverages (fizzy beverages), raw fruits and raw vegetables, or beans.  If your bowels are filled with gas, your surgeon will have difficulty visualizing your pelvic organs which increases your surgical risks.  Do not eat food or drink liquids :After Midnight.     Take these medicines the morning of surgery with A SIP OF WATER: AMLODIPINE, FLUOXETINE, LEVOTHYROXINE, OMEPRAZOLE,                                 You may not have any metal on your body including hair pins and              piercings  Do not wear jewelry, make-up, lotions, powders or perfumes, deodorant             Do not wear nail polish.  Do not shave  48 hours prior to surgery.              Men may shave face and neck.   Do not bring valuables to the hospital. [place].  Contacts, dentures or bridgework may not be worn into surgery.  Leave suitcase in the car. After surgery it may be brought to your room.                Please read over the following fact sheets you were given: _____________________________________________________________________             How to Manage Your Diabetes Before and After Surgery  Why is it important to control my blood sugar before and after surgery? . Improving blood sugar levels before and after surgery helps healing and can limit problems. . A way of improving blood sugar control is eating a healthy diet by: o  Eating less sugar and carbohydrates o  Increasing activity/exercise o  Talking with your doctor about reaching your blood  sugar goals . High blood sugars (greater than 180 mg/dL) can raise your risk of infections and slow your recovery, so you will need to focus on controlling your diabetes during the weeks before surgery. . Make sure that the doctor who takes care of your diabetes knows about your planned surgery including the date and location.   . If you are admitted to the hospital after surgery: o Your blood sugar will be checked by the staff and you will probably be given insulin after surgery (instead of oral diabetes medicines) to make sure you have good blood sugar levels. o The goal for blood sugar control after surgery is 80-180 mg/dL.   WHAT DO I DO ABOUT MY DIABETES MEDICATION?   . THE DAY BEFORE SURGERY, o  take  METFORMIN as normal  o Take exenatide (BYETTA) as normal      . THE MORNING OF SURGERY,DO NOT TAKE ANY DIABETIC MEDICATIONS !   Patient Signature:  Date:   Nurse Signature:  Date:   Reviewed and Endorsed by [facility] Patient Education Committee, August 2015    [facility] - Preparing for Surgery Before surgery, you can play an important role.  Because skin is not sterile, your skin needs to be as free of germs as possible.  You can reduce the number of germs on your skin by washing with CHG (chlorahexidine gluconate) soap before surgery.  CHG is an antiseptic cleaner which kills germs and bonds with the skin to continue killing germs even after washing. Please DO NOT use if you have an allergy to CHG or antibacterial soaps.  If your skin becomes reddened/irritated stop using the CHG and inform your nurse when you arrive at Short Stay. Do not shave (including legs and underarms) for at least 48 hours prior to the first CHG shower.  You may shave your face/neck. Please follow these instructions carefully:  1.  Shower with CHG Soap the night before surgery and the  morning of Surgery.  2.  If you choose to wash your hair, wash your hair first as usual with your  normal   shampoo.  3.  After you shampoo, rinse your hair and body thoroughly to remove the  shampoo.                           4.  Use CHG as you would any other liquid soap.  You can apply chg directly  to the skin and wash                       Gently with a scrungie or clean washcloth.  5.  Apply the CHG Soap to your body ONLY FROM THE NECK DOWN.   Do not use on face/ open                           Wound or open sores. Avoid contact with eyes, ears mouth and genitals (private parts).                       Wash face,  Genitals (private parts) with your normal soap.             6.  Wash thoroughly, paying special attention to the area where your surgery  will be performed.  7.  Thoroughly rinse your body with warm water from the neck down.  8.  DO NOT shower/wash with your normal soap after using and rinsing off  the CHG Soap.                9.  Pat yourself dry with a clean towel.            10.  Wear clean pajamas.            11.  Place clean sheets on your bed the night of your first shower and do not  sleep with pets. Day of Surgery : Do not apply any lotions/deodorants the morning of surgery.  Please wear clean clothes to the hospital/surgery center.  FAILURE TO FOLLOW THESE INSTRUCTIONS MAY RESULT IN THE CANCELLATION OF YOUR SURGERY PATIENT SIGNATURE_________________________________  NURSE SIGNATURE__________________________________  ________________________________________________________________________  WHAT IS A BLOOD TRANSFUSION? Blood Transfusion Information  A transfusion is the replacement of blood or some of its parts. Blood is made up of multiple cells which provide different functions.  Red  blood cells carry oxygen and are used for blood loss replacement.  White blood cells fight against infection.  Platelets control bleeding.  Plasma helps clot blood.  Other blood products are available for specialized needs, such as hemophilia or other clotting disorders. BEFORE THE  TRANSFUSION  Who gives blood for transfusions?   Healthy volunteers who are fully evaluated to make sure their blood is safe. This is blood bank blood. Transfusion therapy is the safest it has ever been in the practice of medicine. Before blood is taken from a donor, a complete history is taken to make sure that person has no history of diseases nor engages in risky social behavior (examples are intravenous drug use or sexual activity with multiple partners). The donor's travel history is screened to minimize risk of transmitting infections, such as malaria. The donated blood is tested for signs of infectious diseases, such as HIV and hepatitis. The blood is then tested to be sure it is compatible with you in order to minimize the chance of a transfusion reaction. If you or a relative donates blood, this is often done in anticipation of surgery and is not appropriate for emergency situations. It takes many days to process the donated blood. RISKS AND COMPLICATIONS Although transfusion therapy is very safe and saves many lives, the main dangers of transfusion include:   Getting an infectious disease.  Developing a transfusion reaction. This is an allergic reaction to something in the blood you were given. Every precaution is taken to prevent this. The decision to have a blood transfusion has been considered carefully by your caregiver before blood is given. Blood is not given unless the benefits outweigh the risks. AFTER THE TRANSFUSION  Right after receiving a blood transfusion, you will usually feel much better and more energetic. This is especially true if your red blood cells have gotten low (anemic). The transfusion raises the level of the red blood cells which carry oxygen, and this usually causes an energy increase.  The nurse administering the transfusion will monitor you carefully for complications. HOME CARE INSTRUCTIONS  No special instructions are needed after a transfusion. You may find  your energy is better. Speak with your caregiver about any limitations on activity for underlying diseases you may have. SEEK MEDICAL CARE IF:   Your condition is not improving after your transfusion.  You develop redness or irritation at the intravenous (IV) site. SEEK IMMEDIATE MEDICAL CARE IF:  Any of the following symptoms occur over the next 12 hours:  Shaking chills.  You have a temperature by mouth above 102 F (38.9 C), not controlled by medicine.  Chest, back, or muscle pain.  People around you feel you are not acting correctly or are confused.  Shortness of breath or difficulty breathing.  Dizziness and fainting.  You get a rash or develop hives.  You have a decrease in urine output.  Your urine turns a dark color or changes to pink, red, or brown. Any of the following symptoms occur over the next 10 days:  You have a temperature by mouth above 102 F (38.9 C), not controlled by medicine.  Shortness of breath.  Weakness after normal activity.  The white part of the eye turns yellow (jaundice).  You have a decrease in the amount of urine or are urinating less often.  Your urine turns a dark color or changes to pink, red, or brown. Document Released: 09/29/2000 Document Revised: 12/25/2011 Document Reviewed: 05/18/2008 [facility] Patient Information 2014 [place], [place].  _______________________________________________________________________

## 2018-01-28 ENCOUNTER — Encounter (HOSPITAL_COMMUNITY): Payer: Self-pay

## 2018-01-28 ENCOUNTER — Encounter (HOSPITAL_COMMUNITY)
Admission: RE | Admit: 2018-01-28 | Discharge: 2018-01-28 | Disposition: A | Discharge status: Home / Self Care | Payer: Medicare HMO | Source: Ambulatory Visit | Attending: Obstetrics | Admitting: Obstetrics

## 2018-01-28 ENCOUNTER — Ambulatory Visit: Payer: Medicare HMO | Admitting: Obstetrics

## 2018-01-28 ENCOUNTER — Encounter: Payer: Self-pay | Admitting: Obstetrics

## 2018-01-28 ENCOUNTER — Other Ambulatory Visit: Payer: Self-pay

## 2018-01-28 ENCOUNTER — Inpatient Hospital Stay (HOSPITAL_BASED_OUTPATIENT_CLINIC_OR_DEPARTMENT_OTHER): Payer: Medicare HMO | Admitting: Obstetrics

## 2018-01-28 VITALS — BP 116/58 | HR 70 | Temp 97.8°F | Resp 20 | Wt 184.0 lb

## 2018-01-28 DIAGNOSIS — R350 Frequency of micturition: Secondary | ICD-10-CM | POA: Diagnosis not present

## 2018-01-28 DIAGNOSIS — N83209 Unspecified ovarian cyst, unspecified side: Secondary | ICD-10-CM | POA: Diagnosis not present

## 2018-01-28 DIAGNOSIS — N83202 Unspecified ovarian cyst, left side: Secondary | ICD-10-CM

## 2018-01-28 DIAGNOSIS — N83299 Other ovarian cyst, unspecified side: Secondary | ICD-10-CM

## 2018-01-28 HISTORY — DX: Pneumonia, unspecified organism: J18.9

## 2018-01-28 LAB — GLUCOSE, CAPILLARY: Glucose-Capillary: 96 mg/dL (ref 65–99)

## 2018-01-28 NOTE — Patient Instructions (Signed)
Preparing for your Surgery  Plan for surgery on January 31, 2018 with Dr. Precious Haws at [facility] will be scheduled for a robotic assisted bilateral salpingo-oophorectomy, possible laparotomy and/or staging.  Pre-operative Testing -You will receive a phone call from presurgical testing at [facility] to arrange for a pre-operative testing appointment before your surgery.  This appointment normally occurs one to two weeks before your scheduled surgery.   -Bring your insurance card, copy of an advanced directive if applicable, medication list  -At that visit, you will be asked to sign a consent for a possible blood transfusion in case a transfusion becomes necessary during surgery.  The need for a blood transfusion is rare but having consent is a necessary part of your care.     -You should not be taking blood thinners or aspirin at least ten days prior to surgery unless instructed by your surgeon.  Day Before Surgery at [place] will be asked to take in a light diet the day before surgery.  Avoid carbonated beverages.  You will be advised to have nothing to eat or drink after midnight the evening before.    Eat a light diet the day before surgery.  Examples including soups, broths, toast, yogurt, mashed potatoes.  Things to avoid include carbonated beverages (fizzy beverages), raw fruits and raw vegetables, or beans.   If your bowels are filled with gas, your surgeon will have difficulty visualizing your pelvic organs which increases your surgical risks.  Your role in recovery Your role is to become active as soon as directed by your doctor, while still giving yourself time to heal.  Rest when you feel tired. You will be asked to do the following in order to speed your recovery:  - Cough and breathe deeply. This helps toclear and expand your lungs and can prevent pneumonia. You may be given a spirometer to practice deep breathing. A staff member will show  you how to use the spirometer. - Do mild physical activity. Walking or moving your legs help your circulation and body functions return to normal. A staff member will help you when you try to walk and will provide you with simple exercises. Do not try to get up or walk alone the first time. - Actively manage your pain. Managing your pain lets you move in comfort. We will ask you to rate your pain on a scale of zero to 10. It is your responsibility to tell your doctor or nurse where and how much you hurt so your pain can be treated.  Special Considerations -If you are diabetic, you may be placed on insulin after surgery to have closer control over your blood sugars to promote healing and recovery.  This does not mean that you will be discharged on insulin.  If applicable, your oral antidiabetics will be resumed when you are tolerating a solid diet.  -Your final pathology results from surgery should be available by the Friday after surgery and the results will be relayed to you when available.  -Dr. Lahoma Crocker is the Surgeon that assists your GYN Oncologist with surgery.  The next day after your surgery you will either see your GYN Oncologist or Dr. Lahoma Crocker.   Blood Transfusion Information WHAT IS A BLOOD TRANSFUSION?  A transfusion is the replacement of blood or some of its parts. Blood is made up of multiple cells which provide different functions.  Red blood cells carry oxygen and are used for blood loss  replacement.  White blood cells fight against infection.  Platelets control bleeding.  Plasma helps clot blood.  Other blood products are available for specialized needs, such as hemophilia or other clotting disorders. BEFORE THE TRANSFUSION  Who gives blood for transfusions?   You may be able to donate blood to be used at a later date on yourself (autologous donation).  Relatives can be asked to donate blood. This is generally not any safer than if you have  received blood from a stranger. The same precautions are taken to ensure safety when a relative's blood is donated.  Healthy volunteers who are fully evaluated to make sure their blood is safe. This is blood bank blood. Transfusion therapy is the safest it has ever been in the practice of medicine. Before blood is taken from a donor, a complete history is taken to make sure that person has no history of diseases nor engages in risky social behavior (examples are intravenous drug use or sexual activity with multiple partners). The donor's travel history is screened to minimize risk of transmitting infections, such as malaria. The donated blood is tested for signs of infectious diseases, such as HIV and hepatitis. The blood is then tested to be sure it is compatible with you in order to minimize the chance of a transfusion reaction. If you or a relative donates blood, this is often done in anticipation of surgery and is not appropriate for emergency situations. It takes many days to process the donated blood. RISKS AND COMPLICATIONS Although transfusion therapy is very safe and saves many lives, the main dangers of transfusion include:   Getting an infectious disease.  Developing a transfusion reaction. This is an allergic reaction to something in the blood you were given. Every precaution is taken to prevent this. The decision to have a blood transfusion has been considered carefully by your caregiver before blood is given. Blood is not given unless the benefits outweigh the risks.

## 2018-01-29 ENCOUNTER — Telehealth: Payer: Self-pay | Admitting: *Deleted

## 2018-01-29 ENCOUNTER — Encounter: Payer: Self-pay | Admitting: Obstetrics

## 2018-01-29 NOTE — H&P (View-Only) (Signed)
Progress Note: Gyn-Onc  Consult was initally requested by Dr. Elly Modena for the evaluation of [name] 77 y.o. female  CC:  Chief Complaint  Patient presents with  . Complex ovarian cyst    HPI:  Interval History [name] is a 77 y.o. with an 8 cm complex pelvic mass and normal CA 125 of 11 on 01/09/18, no family history of a second-degree relative with breast cancer in first-degree relative with bladder cancer.  She has a history of hysterectomy in 1984.  Recommendations were for bilateral salpingo-oophorectomy with staging and debulking based on operative findings.   On her first visit with [place], Dr. Skeet Latch reviewed, the benefits of a minimally invasive approach which include short hospitalization with her return to function.  She is aware that if there is intraoperative adhesive disease or other intraoperative processes that may this procedure safe for by a laparotomy that would be performed.  She was counseled for robotic assisted bilateral salpingo-oophorectomy possible omentectomy lymph node dissection biopsies debulking and other indicated procedures including laparotomy.  Risks of the procedures discussed were infection bleeding damage to surrounding structures prolonged hospitalization and reoperation and DVT.   The surgery was placed on my schedule to help expedite the procedure. She returns today to review the plans and discuss options. I have been unable to obtain the images on ultrasound due to the nature of the study. The radiologist who read the MRI is to call me this morning to review the level of concern for malignancy.  I confirmed with the patient her social constraints with upcoming family plans and those include a graduation of a grand-daughter in one month. I re-reviewed her PCN allergy which sounds like a true reaction. She was in [employer] 7, had a "sinus infection" was given PCN injection then breathing worsened, she became swollen, and was taken to hospital in  [place].  She is symptomatic with increased urinary frequency.   Presenting History: [name] is a 77 y.o. for the 2 para 2 with abnormalities in gait that prompted an MRI of the right hip.  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  She then reported left-sided back pain Pelvic ultrasound on January 02, 2018 is notable for left adnexa measuring 8.1 x 6.6 x 5.9 cm complex partially cystic with solid components.  Blood flow was noted within the solid portion of the mass. Ca1 25 returned a value of 11.1.  Family history is notable for a niece with a diagnosis of breast cancer at age 43.  She also reports that her sister treated for bladder cancer at the age of 34.  His Reports constipation with intermittent diarrhea.  She denies abdominal bloating or weight gain.  There is no bleeding of the rectum bladder or vagina or cough.  Last mammogram was in March 2019 within normal limits and colonoscopy in 2017 was notable for atypical polyps which were removed.    Review of Systems:  Review of Systems  Musculoskeletal: Positive for back pain.  All other systems reviewed and are negative.    Current Meds:  Outpatient Encounter Medications as of 01/28/2018  Medication Sig  . ALPRAZolam (XANAX) 0.5 MG tablet Take 0.5 mg by mouth at bedtime.   Marland Kitchen amLODipine (NORVASC) 5 MG tablet Take 5 mg by mouth every morning.  . Aspirin-Caffeine (BC FAST PAIN RELIEF PO) Take 1 packet by mouth 3 (three)  times daily as needed (pain).  Marland Kitchen atorvastatin (LIPITOR) 20 MG tablet Take 20 mg by mouth every evening.   . Cholecalciferol (VITAMIN D) 2000 units tablet Take 2,000 Units by mouth daily.  . Cyanocobalamin (B-12 COMPLIANCE INJECTION) 1000 MCG/ML KIT Inject 1,000 mcg as directed every 30 (thirty) days.  . diphenhydrAMINE (BENADRYL) 25 MG tablet Take 25 mg by mouth daily as needed for itching (bug bites).  Marland Kitchen  exenatide (BYETTA) 10 MCG/0.04ML SOPN injection Inject 10 mcg into the skin 2 (two) times daily.   Marland Kitchen FLUoxetine (PROZAC) 20 MG capsule Take 20 mg by mouth every morning.  . hydrochlorothiazide (HYDRODIURIL) 25 MG tablet Take 25 mg by mouth every morning.  Marland Kitchen levothyroxine (SYNTHROID, LEVOTHROID) 100 MCG tablet Take 100 mcg by mouth daily before breakfast.  . losartan (COZAAR) 100 MG tablet Take 100 mg by mouth every morning.  . Melatonin 5 MG TABS Take 5-10 mg by mouth at bedtime.  . meloxicam (MOBIC) 15 MG tablet Take 15 mg by mouth once daily  . metFORMIN (GLUCOPHAGE-XR) 500 MG 24 hr tablet Take 1,000 mg by mouth 2 (two) times daily.  Marland Kitchen omeprazole (PRILOSEC OTC) 20 MG tablet Take 20 mg by mouth daily.  . potassium chloride (K-DUR,KLOR-CON) 10 MEQ tablet Take 10 mEq by mouth every evening.   . vitamin E 400 UNIT capsule Take 400 Units by mouth daily.   No facility-administered encounter medications on file as of 01/28/2018.     Allergy:  Allergies  Allergen Reactions  . Penicillins Anaphylaxis    Has patient had a PCN reaction causing immediate rash, facial/tongue/throat swelling, SOB or lightheadedness with hypotension: Yes Has patient had a PCN reaction causing severe rash involving mucus membranes or skin necrosis: No Has patient had a PCN reaction that required hospitalization: Yes Has patient had a PCN reaction occurring within the last 10 years: No If all of the above answers are "NO", then may proceed with Cephalosporin use.     Social Hx:   Social History   Socioeconomic History  . Marital status: Widowed    Spouse name: Not on file  . Number of children: Not on file  . Years of education: Not on file  . Highest education level: Not on file  Occupational History  . Not on file  Social Needs  . Financial resource strain: Not on file  . Food insecurity:    Worry: Not on file    Inability: Not on file  . Transportation needs:    Medical: Not on file    Non-medical:  Not on file  Tobacco Use  . Smoking status: Never Smoker  . Smokeless tobacco: Never Used  Substance and Sexual Activity  . Alcohol use: No  . Drug use: No  . Sexual activity: Not on file  Lifestyle  . Physical activity:    Days per week: Not on file    Minutes per session: Not on file  . Stress: Not on file  Relationships  . Social connections:    Talks on phone: Not on file    Gets together: Not on file    Attends religious service: Not on file    Active member of club or organization: Not on file    Attends meetings of clubs or organizations: Not on file    Relationship status: Not on file  . Intimate partner violence:    Fear of current or ex partner: Not on file    Emotionally abused: Not on file  Physically abused: Not on file    Forced sexual activity: Not on file  Other Topics Concern  . Not on file  Social History Narrative  . Not on file    Past Surgical Hx:  Past Surgical History:  Procedure Laterality Date  . BLADDER SUSPENSION  2002  . BREAST SURGERY     several benign cysts removed; surgeries from [place]   . CHOLECYSTECTOMY  1988  . EYE SURGERY  2010 amd 2012   cataracts removed bilateral   . KNEE ARTHROSCOPY Left    l knee x2  . PARATHYROIDECTOMY  2010  . ROTATOR CUFF REPAIR  2005   right   . 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 Arthritis   . Diabetes mellitus without complication ([place])    type 2   . Difficulty sleeping   . GERD (gastroesophageal reflux disease)   . Hypertension   . Hypothyroidism   . Pneumonia    its been 4 years   . PONV (postoperative nausea and vomiting)   . Vitamin B 12 deficiency     Past Gynecological History: 2 prior to hysterectomy in 1984 received estrogen therapy from 1984  until 2000 No LMP recorded. Patient has had a hysterectomy.  Family Hx:  Family History  Problem Relation Age of Onset  . Lymphoma Mother   . Bladder Cancer Sister   . Prostate cancer Brother   . Leukemia Brother   . Breast cancer Other 72       sister's daughter    Vitals:  Blood pressure (!) 116/58, pulse 70, temperature 97.8 F (36.6 C), temperature source Oral, resp. rate 20, weight 184 lb (83.5 kg), SpO2 97 %.  Physical Exam:   ECOG PERFORMANCE STATUS: 1 - Symptomatic but completely ambulatory   General :  Well developed, 77 y.o., female in no apparent distress HEENT:  Normocephalic/atraumatic, symmetric, EOMI, eyelids normal Neck:   No visible masses.  Respiratory:  Respirations unlabored, no use of accessory muscles CV:   Deferred Breast:  Deferred Musculoskeletal: Normal muscle strength. Abdomen:   No visible masses or protrusion Extremities:  No visible edema or deformities Skin:   Normal inspection Neuro/Psych:  No focal motor deficit, no abnormal mental status. Normal gait. Normal affect. Alert and oriented to person, place, and time   Pelvic Exam by Dr. Skeet Latch 01/24/18  Vulva/vagina: Normal external female genitalia.  No lesions. No discharge or bleeding.  Bladder/urethra:  No lesions or masses  Vagina no masses palpable  Adnexa: Masses assessed on vaginal examination  Rectal  Good tone, no masses no cul de sac nodularity, soft mass palpable on rectal examination within the pelvis   ASSESSMENT Complex ovarian cyst  PLAN 1. We discussed surgical management again today. 2. I discussed the laparoscopic and surgical steps and for her the concern of possible adhesive disease given the prior hysterectomy.  3. In addition the solid component means we'll have to perform a larger incision to remove the mass. 4. If there is borderline or benign disease I hope to keep the procedure laparoscopic. If there is concern for high grade malignancy I favor laparotomy and  staging. She has not had upper abdominal imaging. 5. We discussed best attempts to remove the contralateral (probably normal)  ovary and the increased risks that go with that. If the choice is between laparotomy versus leaving the normal adnexa I recommend leaving the normal adnexa. She agrees. 6. All of her and her daughter's questions were answered to their apparent satisfaction. 7. RTC  10-14 days postop.  Amy Caprice, MD 01/29/2018, 10:44 AM

## 2018-01-29 NOTE — Progress Notes (Signed)
Progress Note: Gyn-Onc  Consult was initally requested by Dr. Elly Modena for the evaluation of [name] 77 y.o. female  CC:  Chief Complaint  Patient presents with  . Complex ovarian cyst    HPI:  Interval History [name] is a 77 y.o. with an 8 cm complex pelvic mass and normal CA 125 of 11 on 01/09/18, no family history of a second-degree relative with breast cancer in first-degree relative with bladder cancer.  She has a history of hysterectomy in 1984.  Recommendations were for bilateral salpingo-oophorectomy with staging and debulking based on operative findings.   On her first visit with [place], Dr. Skeet Latch reviewed, the benefits of a minimally invasive approach which include short hospitalization with her return to function.  She is aware that if there is intraoperative adhesive disease or other intraoperative processes that may this procedure safe for by a laparotomy that would be performed.  She was counseled for robotic assisted bilateral salpingo-oophorectomy possible omentectomy lymph node dissection biopsies debulking and other indicated procedures including laparotomy.  Risks of the procedures discussed were infection bleeding damage to surrounding structures prolonged hospitalization and reoperation and DVT.   The surgery was placed on my schedule to help expedite the procedure. She returns today to review the plans and discuss options. I have been unable to obtain the images on ultrasound due to the nature of the study. The radiologist who read the MRI is to call me this morning to review the level of concern for malignancy.  I confirmed with the patient her social constraints with upcoming family plans and those include a graduation of a grand-daughter in one month. I re-reviewed her PCN allergy which sounds like a true reaction. She was in [employer], had a "sinus infection" was given PCN injection then breathing worsened, she became swollen, and was taken to hospital in  [place].  She is symptomatic with increased urinary frequency.   Presenting History: [name] is a 77 y.o. for the 2 para 2 with abnormalities in gait that prompted an MRI of the right hip.  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  She then reported left-sided back pain Pelvic ultrasound on January 02, 2018 is notable for left adnexa measuring 8.1 x 6.6 x 5.9 cm complex partially cystic with solid components.  Blood flow was noted within the solid portion of the mass. Ca1 25 returned a value of 11.1.  Family history is notable for a niece with a diagnosis of breast cancer at age 43.  She also reports that her sister treated for bladder cancer at the age of 34.  His Reports constipation with intermittent diarrhea.  She denies abdominal bloating or weight gain.  There is no bleeding of the rectum bladder or vagina or cough.  Last mammogram was in March 2019 within normal limits and colonoscopy in 2017 was notable for atypical polyps which were removed.    Review of Systems:  Review of Systems  Musculoskeletal: Positive for back pain.  All other systems reviewed and are negative.    Current Meds:  Outpatient Encounter Medications as of 01/28/2018  Medication Sig  . ALPRAZolam (XANAX) 0.5 MG tablet Take 0.5 mg by mouth at bedtime.   Marland Kitchen amLODipine (NORVASC) 5 MG tablet Take 5 mg by mouth every morning.  . Aspirin-Caffeine (BC FAST PAIN RELIEF PO) Take 1 packet by mouth 3 (three)  times daily as needed (pain).  Marland Kitchen atorvastatin (LIPITOR) 20 MG tablet Take 20 mg by mouth every evening.   . Cholecalciferol (VITAMIN D) 2000 units tablet Take 2,000 Units by mouth daily.  . Cyanocobalamin (B-12 COMPLIANCE INJECTION) 1000 MCG/ML KIT Inject 1,000 mcg as directed every 30 (thirty) days.  . diphenhydrAMINE (BENADRYL) 25 MG tablet Take 25 mg by mouth daily as needed for itching (bug bites).  Marland Kitchen  exenatide (BYETTA) 10 MCG/0.04ML SOPN injection Inject 10 mcg into the skin 2 (two) times daily.   Marland Kitchen FLUoxetine (PROZAC) 20 MG capsule Take 20 mg by mouth every morning.  . hydrochlorothiazide (HYDRODIURIL) 25 MG tablet Take 25 mg by mouth every morning.  Marland Kitchen levothyroxine (SYNTHROID, LEVOTHROID) 100 MCG tablet Take 100 mcg by mouth daily before breakfast.  . losartan (COZAAR) 100 MG tablet Take 100 mg by mouth every morning.  . Melatonin 5 MG TABS Take 5-10 mg by mouth at bedtime.  . meloxicam (MOBIC) 15 MG tablet Take 15 mg by mouth once daily  . metFORMIN (GLUCOPHAGE-XR) 500 MG 24 hr tablet Take 1,000 mg by mouth 2 (two) times daily.  Marland Kitchen omeprazole (PRILOSEC OTC) 20 MG tablet Take 20 mg by mouth daily.  . potassium chloride (K-DUR,KLOR-CON) 10 MEQ tablet Take 10 mEq by mouth every evening.   . vitamin E 400 UNIT capsule Take 400 Units by mouth daily.   No facility-administered encounter medications on file as of 01/28/2018.     Allergy:  Allergies  Allergen Reactions  . Penicillins Anaphylaxis    Has patient had a PCN reaction causing immediate rash, facial/tongue/throat swelling, SOB or lightheadedness with hypotension: Yes Has patient had a PCN reaction causing severe rash involving mucus membranes or skin necrosis: No Has patient had a PCN reaction that required hospitalization: Yes Has patient had a PCN reaction occurring within the last 10 years: No If all of the above answers are "NO", then may proceed with Cephalosporin use.     Social Hx:   Social History   Socioeconomic History  . Marital status: Widowed    Spouse name: Not on file  . Number of children: Not on file  . Years of education: Not on file  . Highest education level: Not on file  Occupational History  . Not on file  Social Needs  . Financial resource strain: Not on file  . Food insecurity:    Worry: Not on file    Inability: Not on file  . Transportation needs:    Medical: Not on file    Non-medical:  Not on file  Tobacco Use  . Smoking status: Never Smoker  . Smokeless tobacco: Never Used  Substance and Sexual Activity  . Alcohol use: No  . Drug use: No  . Sexual activity: Not on file  Lifestyle  . Physical activity:    Days per week: Not on file    Minutes per session: Not on file  . Stress: Not on file  Relationships  . Social connections:    Talks on phone: Not on file    Gets together: Not on file    Attends religious service: Not on file    Active member of club or organization: Not on file    Attends meetings of clubs or organizations: Not on file    Relationship status: Not on file  . Intimate partner violence:    Fear of current or ex partner: Not on file    Emotionally abused: Not on file  Physically abused: Not on file    Forced sexual activity: Not on file  Other Topics Concern  . Not on file  Social History Narrative  . Not on file    Past Surgical Hx:  Past Surgical History:  Procedure Laterality Date  . BLADDER SUSPENSION  2002  . BREAST SURGERY     several benign cysts removed; surgeries from [place]   . CHOLECYSTECTOMY  1988  . EYE SURGERY  2010 amd 2012   cataracts removed bilateral   . KNEE ARTHROSCOPY Left    l knee x2  . PARATHYROIDECTOMY  2010  . ROTATOR CUFF REPAIR  2005   right   . 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 Arthritis   . Diabetes mellitus without complication ([place])    type 2   . Difficulty sleeping   . GERD (gastroesophageal reflux disease)   . Hypertension   . Hypothyroidism   . Pneumonia    its been 4 years   . PONV (postoperative nausea and vomiting)   . Vitamin B 12 deficiency     Past Gynecological History: 2 prior to hysterectomy in 1984 received estrogen therapy from 1984  until 2000 No LMP recorded. Patient has had a hysterectomy.  Family Hx:  Family History  Problem Relation Age of Onset  . Lymphoma Mother   . Bladder Cancer Sister   . Prostate cancer Brother   . Leukemia Brother   . Breast cancer Other 72       sister's daughter    Vitals:  Blood pressure (!) 116/58, pulse 70, temperature 97.8 F (36.6 C), temperature source Oral, resp. rate 20, weight 184 lb (83.5 kg), SpO2 97 %.  Physical Exam:   ECOG PERFORMANCE STATUS: 1 - Symptomatic but completely ambulatory   General :  Well developed, 77 y.o., female in no apparent distress HEENT:  Normocephalic/atraumatic, symmetric, EOMI, eyelids normal Neck:   No visible masses.  Respiratory:  Respirations unlabored, no use of accessory muscles CV:   Deferred Breast:  Deferred Musculoskeletal: Normal muscle strength. Abdomen:   No visible masses or protrusion Extremities:  No visible edema or deformities Skin:   Normal inspection Neuro/Psych:  No focal motor deficit, no abnormal mental status. Normal gait. Normal affect. Alert and oriented to person, place, and time   Pelvic Exam by Dr. Skeet Latch 01/24/18  Vulva/vagina: Normal external female genitalia.  No lesions. No discharge or bleeding.  Bladder/urethra:  No lesions or masses  Vagina no masses palpable  Adnexa: Masses assessed on vaginal examination  Rectal  Good tone, no masses no cul de sac nodularity, soft mass palpable on rectal examination within the pelvis   ASSESSMENT Complex ovarian cyst  PLAN 1. We discussed surgical management again today. 2. I discussed the laparoscopic and surgical steps and for her the concern of possible adhesive disease given the prior hysterectomy.  3. In addition the solid component means we'll have to perform a larger incision to remove the mass. 4. If there is borderline or benign disease I hope to keep the procedure laparoscopic. If there is concern for high grade malignancy I favor laparotomy and  staging. She has not had upper abdominal imaging. 5. We discussed best attempts to remove the contralateral (probably normal)  ovary and the increased risks that go with that. If the choice is between laparotomy versus leaving the normal adnexa I recommend leaving the normal adnexa. She agrees. 6. All of her and her daughter's questions were answered to their apparent satisfaction. 7. RTC  10-14 days postop.  Isabel Caprice, MD 01/29/2018, 10:44 AM

## 2018-01-29 NOTE — Telephone Encounter (Signed)
Called and spoke with the patient, gave the post op appt. Patient stated that she "Is in pain with her back/hip. Can I take a hyrocodone? I was told to not take ASA." Per Dr. Gerarda Fraction patient can take pain medicines. Advised patient of the above

## 2018-01-31 ENCOUNTER — Encounter (HOSPITAL_COMMUNITY)
Admission: AD | Disposition: A | Discharge status: Home / Self Care | Payer: Self-pay | Source: Ambulatory Visit | Attending: Obstetrics

## 2018-01-31 ENCOUNTER — Encounter (HOSPITAL_COMMUNITY): Payer: Self-pay

## 2018-01-31 ENCOUNTER — Ambulatory Visit (HOSPITAL_COMMUNITY): Payer: Medicare HMO | Admitting: Certified Registered"

## 2018-01-31 ENCOUNTER — Inpatient Hospital Stay (HOSPITAL_COMMUNITY)
Admission: AD | Admit: 2018-01-31 | Discharge: 2018-02-02 | DRG: 737 | Disposition: A | Discharge status: Home / Self Care | Payer: Medicare HMO | Source: Ambulatory Visit | Attending: Obstetrics | Admitting: Obstetrics

## 2018-01-31 DIAGNOSIS — Z01812 Encounter for preprocedural laboratory examination: Secondary | ICD-10-CM

## 2018-01-31 DIAGNOSIS — Z803 Family history of malignant neoplasm of breast: Secondary | ICD-10-CM | POA: Diagnosis not present

## 2018-01-31 DIAGNOSIS — E1151 Type 2 diabetes mellitus with diabetic peripheral angiopathy without gangrene: Secondary | ICD-10-CM | POA: Diagnosis present

## 2018-01-31 DIAGNOSIS — N838 Other noninflammatory disorders of ovary, fallopian tube and broad ligament: Secondary | ICD-10-CM

## 2018-01-31 DIAGNOSIS — I10 Essential (primary) hypertension: Secondary | ICD-10-CM | POA: Diagnosis present

## 2018-01-31 DIAGNOSIS — Z96652 Presence of left artificial knee joint: Secondary | ICD-10-CM | POA: Diagnosis present

## 2018-01-31 DIAGNOSIS — E119 Type 2 diabetes mellitus without complications: Secondary | ICD-10-CM | POA: Diagnosis present

## 2018-01-31 DIAGNOSIS — K5900 Constipation, unspecified: Secondary | ICD-10-CM | POA: Diagnosis present

## 2018-01-31 DIAGNOSIS — C562 Malignant neoplasm of left ovary: Principal | ICD-10-CM | POA: Diagnosis present

## 2018-01-31 DIAGNOSIS — R197 Diarrhea, unspecified: Secondary | ICD-10-CM | POA: Diagnosis present

## 2018-01-31 DIAGNOSIS — Z01818 Encounter for other preprocedural examination: Secondary | ICD-10-CM

## 2018-01-31 DIAGNOSIS — R1900 Intra-abdominal and pelvic swelling, mass and lump, unspecified site: Secondary | ICD-10-CM | POA: Diagnosis present

## 2018-01-31 DIAGNOSIS — Z807 Family history of other malignant neoplasms of lymphoid, hematopoietic and related tissues: Secondary | ICD-10-CM

## 2018-01-31 DIAGNOSIS — R350 Frequency of micturition: Secondary | ICD-10-CM | POA: Diagnosis present

## 2018-01-31 DIAGNOSIS — N736 Female pelvic peritoneal adhesions (postinfective): Secondary | ICD-10-CM | POA: Diagnosis present

## 2018-01-31 DIAGNOSIS — E039 Hypothyroidism, unspecified: Secondary | ICD-10-CM | POA: Diagnosis present

## 2018-01-31 DIAGNOSIS — C786 Secondary malignant neoplasm of retroperitoneum and peritoneum: Secondary | ICD-10-CM | POA: Diagnosis present

## 2018-01-31 DIAGNOSIS — C7982 Secondary malignant neoplasm of genital organs: Secondary | ICD-10-CM | POA: Diagnosis present

## 2018-01-31 DIAGNOSIS — Z8042 Family history of malignant neoplasm of prostate: Secondary | ICD-10-CM

## 2018-01-31 DIAGNOSIS — Z8052 Family history of malignant neoplasm of bladder: Secondary | ICD-10-CM | POA: Diagnosis not present

## 2018-01-31 DIAGNOSIS — Z01810 Encounter for preprocedural cardiovascular examination: Secondary | ICD-10-CM | POA: Diagnosis not present

## 2018-01-31 DIAGNOSIS — C569 Malignant neoplasm of unspecified ovary: Secondary | ICD-10-CM

## 2018-01-31 DIAGNOSIS — Z90710 Acquired absence of both cervix and uterus: Secondary | ICD-10-CM

## 2018-01-31 DIAGNOSIS — Z806 Family history of leukemia: Secondary | ICD-10-CM

## 2018-01-31 DIAGNOSIS — Z880 Allergy status to penicillin: Secondary | ICD-10-CM

## 2018-01-31 HISTORY — PX: ROBOTIC ASSISTED SALPINGO OOPHERECTOMY: SHX6082

## 2018-01-31 HISTORY — DX: Malignant neoplasm of unspecified ovary: C56.9

## 2018-01-31 LAB — CBC
HCT: 43.4 % (ref 36.0–46.0)
Hemoglobin: 14.4 g/dL (ref 12.0–15.0)
MCH: 29.9 pg (ref 26.0–34.0)
MCHC: 33.2 g/dL (ref 30.0–36.0)
MCV: 90 fL (ref 78.0–100.0)
PLATELETS: 198 10*3/uL (ref 150–400)
RBC: 4.82 MIL/uL (ref 3.87–5.11)
RDW: 14.5 % (ref 11.5–15.5)
WBC: 10.9 10*3/uL — ABNORMAL HIGH (ref 4.0–10.5)

## 2018-01-31 LAB — COMPREHENSIVE METABOLIC PANEL
ALT: 58 U/L — ABNORMAL HIGH (ref 14–54)
ANION GAP: 12 (ref 5–15)
AST: 136 U/L — ABNORMAL HIGH (ref 15–41)
Albumin: 3.6 g/dL (ref 3.5–5.0)
Alkaline Phosphatase: 49 U/L (ref 38–126)
BUN: 23 mg/dL — ABNORMAL HIGH (ref 6–20)
CHLORIDE: 104 mmol/L (ref 101–111)
CO2: 24 mmol/L (ref 22–32)
Calcium: 8.7 mg/dL — ABNORMAL LOW (ref 8.9–10.3)
Creatinine, Ser: 0.7 mg/dL (ref 0.44–1.00)
Glucose, Bld: 239 mg/dL — ABNORMAL HIGH (ref 65–99)
Potassium: 3.5 mmol/L (ref 3.5–5.1)
SODIUM: 140 mmol/L (ref 135–145)
Total Bilirubin: 1.1 mg/dL (ref 0.3–1.2)
Total Protein: 6 g/dL — ABNORMAL LOW (ref 6.5–8.1)

## 2018-01-31 LAB — GLUCOSE, CAPILLARY
GLUCOSE-CAPILLARY: 111 mg/dL — AB (ref 65–99)
GLUCOSE-CAPILLARY: 197 mg/dL — AB (ref 65–99)
GLUCOSE-CAPILLARY: 237 mg/dL — AB (ref 65–99)
GLUCOSE-CAPILLARY: 216 mg/dL — AB (ref 65–99)
Glucose-Capillary: 212 mg/dL — ABNORMAL HIGH (ref 65–99)

## 2018-01-31 LAB — TYPE AND SCREEN
ABO/RH(D): O POS
ANTIBODY SCREEN: NEGATIVE

## 2018-01-31 SURGERY — SALPINGO-OOPHORECTOMY, ROBOT-ASSISTED
Anesthesia: General | Laterality: Bilateral

## 2018-01-31 MED ORDER — PANTOPRAZOLE SODIUM 40 MG PO TBEC
40.0000 mg | DELAYED_RELEASE_TABLET | Freq: Every day | ORAL | Status: DC
Start: 2018-02-01 — End: 2018-02-02
  Administered 2018-02-01 – 2018-02-02 (×2): 40 mg via ORAL
  Filled 2018-01-31 (×2): qty 1

## 2018-01-31 MED ORDER — ROCURONIUM BROMIDE 10 MG/ML (PF) SYRINGE
PREFILLED_SYRINGE | INTRAVENOUS | Status: AC
  Filled 2018-01-31: qty 5

## 2018-01-31 MED ORDER — LACTATED RINGERS IV SOLN
INTRAVENOUS | Status: DC
  Administered 2018-01-31 (×4): via INTRAVENOUS

## 2018-01-31 MED ORDER — LACTATED RINGERS IR SOLN
Status: DC | PRN
  Administered 2018-01-31: 1000 mL

## 2018-01-31 MED ORDER — ATORVASTATIN CALCIUM 20 MG PO TABS
20.0000 mg | ORAL_TABLET | Freq: Every evening | ORAL | Status: DC
  Administered 2018-01-31 – 2018-02-01 (×2): 20 mg via ORAL
  Filled 2018-01-31 (×2): qty 1

## 2018-01-31 MED ORDER — HYDROMORPHONE HCL 1 MG/ML IJ SOLN
0.2500 mg | INTRAMUSCULAR | Status: DC | PRN
  Administered 2018-01-31 (×2): 0.5 mg via INTRAVENOUS

## 2018-01-31 MED ORDER — OMEPRAZOLE MAGNESIUM 20 MG PO TBEC: 20.0000 mg | DELAYED_RELEASE_TABLET | Freq: Every day | ORAL | Status: DC

## 2018-01-31 MED ORDER — ONDANSETRON HCL 4 MG/2ML IJ SOLN
INTRAMUSCULAR | Status: DC | PRN
  Administered 2018-01-31: 4 mg via INTRAVENOUS

## 2018-01-31 MED ORDER — IBUPROFEN 200 MG PO TABS
600.0000 mg | ORAL_TABLET | Freq: Four times a day (QID) | ORAL | Status: DC
  Administered 2018-02-01 – 2018-02-02 (×5): 600 mg via ORAL
  Filled 2018-01-31 (×5): qty 3

## 2018-01-31 MED ORDER — CLINDAMYCIN PHOSPHATE 900 MG/50ML IV SOLN
900.0000 mg | INTRAVENOUS | Status: AC
  Administered 2018-01-31: 900 mg via INTRAVENOUS
  Filled 2018-01-31: qty 50

## 2018-01-31 MED ORDER — KCL IN DEXTROSE-NACL 20-5-0.45 MEQ/L-%-% IV SOLN
INTRAVENOUS | Status: DC
  Administered 2018-01-31 – 2018-02-01 (×2): via INTRAVENOUS
  Filled 2018-01-31 (×2): qty 1000

## 2018-01-31 MED ORDER — FENTANYL CITRATE (PF) 100 MCG/2ML IJ SOLN
INTRAMUSCULAR | Status: DC | PRN
  Administered 2018-01-31 (×5): 50 ug via INTRAVENOUS

## 2018-01-31 MED ORDER — HYDROMORPHONE HCL 1 MG/ML IJ SOLN
0.5000 mg | INTRAMUSCULAR | Status: DC | PRN
  Administered 2018-01-31 – 2018-02-01 (×2): 0.5 mg via INTRAVENOUS
  Filled 2018-01-31 (×2): qty 0.5

## 2018-01-31 MED ORDER — LIDOCAINE 2% (20 MG/ML) 5 ML SYRINGE
INTRAMUSCULAR | Status: DC | PRN
  Administered 2018-01-31: 100 mg via INTRAVENOUS

## 2018-01-31 MED ORDER — SENNOSIDES-DOCUSATE SODIUM 8.6-50 MG PO TABS
2.0000 | ORAL_TABLET | Freq: Every day | ORAL | Status: DC
  Administered 2018-01-31 – 2018-02-01 (×2): 2 via ORAL
  Filled 2018-01-31 (×3): qty 2

## 2018-01-31 MED ORDER — ONDANSETRON HCL 4 MG/2ML IJ SOLN: 4.0000 mg | Freq: Four times a day (QID) | INTRAMUSCULAR | Status: DC | PRN

## 2018-01-31 MED ORDER — PROPOFOL 10 MG/ML IV BOLUS
INTRAVENOUS | Status: AC
  Filled 2018-01-31: qty 20

## 2018-01-31 MED ORDER — SUGAMMADEX SODIUM 200 MG/2ML IV SOLN
INTRAVENOUS | Status: AC
  Filled 2018-01-31: qty 2

## 2018-01-31 MED ORDER — BUPIVACAINE-EPINEPHRINE (PF) 0.5% -1:200000 IJ SOLN
INTRAMUSCULAR | Status: DC | PRN
  Administered 2018-01-31: 33 mL

## 2018-01-31 MED ORDER — ONDANSETRON HCL 4 MG PO TABS: 4.0000 mg | ORAL_TABLET | Freq: Four times a day (QID) | ORAL | Status: DC | PRN

## 2018-01-31 MED ORDER — NON FORMULARY: 1.0000 [IU] | Freq: Three times a day (TID) | Status: DC

## 2018-01-31 MED ORDER — SODIUM CHLORIDE 0.9 % IJ SOLN
INTRAMUSCULAR | Status: AC
  Filled 2018-01-31: qty 10

## 2018-01-31 MED ORDER — HYDROMORPHONE HCL 1 MG/ML IJ SOLN
INTRAMUSCULAR | Status: AC
  Filled 2018-01-31: qty 1

## 2018-01-31 MED ORDER — PROPOFOL 10 MG/ML IV BOLUS
INTRAVENOUS | Status: DC | PRN
  Administered 2018-01-31: 120 mg via INTRAVENOUS

## 2018-01-31 MED ORDER — ALPRAZOLAM 0.5 MG PO TABS
0.5000 mg | ORAL_TABLET | Freq: Every day | ORAL | Status: DC
  Administered 2018-01-31 – 2018-02-01 (×2): 0.5 mg via ORAL
  Filled 2018-01-31 (×2): qty 1

## 2018-01-31 MED ORDER — DEXAMETHASONE SODIUM PHOSPHATE 10 MG/ML IJ SOLN
INTRAMUSCULAR | Status: DC | PRN
  Administered 2018-01-31: 10 mg via INTRAVENOUS

## 2018-01-31 MED ORDER — INSULIN GLARGINE 100 UNIT/ML ~~LOC~~ SOLN
16.0000 [IU] | Freq: Every day | SUBCUTANEOUS | Status: DC
  Administered 2018-01-31 – 2018-02-01 (×2): 16 [IU] via SUBCUTANEOUS
  Filled 2018-01-31 (×3): qty 0.16

## 2018-01-31 MED ORDER — GLUCERNA SHAKE PO LIQD
237.0000 mL | Freq: Three times a day (TID) | ORAL | Status: DC
  Administered 2018-02-01 – 2018-02-02 (×3): 237 mL via ORAL
  Filled 2018-01-31 (×5): qty 237

## 2018-01-31 MED ORDER — STERILE WATER FOR IRRIGATION IR SOLN
Status: DC | PRN
  Administered 2018-01-31: 1000 mL

## 2018-01-31 MED ORDER — SIMETHICONE 80 MG PO CHEW
80.0000 mg | CHEWABLE_TABLET | Freq: Four times a day (QID) | ORAL | Status: DC | PRN
  Administered 2018-01-31 – 2018-02-01 (×2): 80 mg via ORAL
  Filled 2018-01-31 (×2): qty 1

## 2018-01-31 MED ORDER — ENOXAPARIN (LOVENOX) PATIENT EDUCATION KIT
PACK | Freq: Once | Status: AC
  Administered 2018-02-01: 09:00:00
  Filled 2018-01-31: qty 1

## 2018-01-31 MED ORDER — SODIUM CHLORIDE 0.9 % IJ SOLN
INTRAMUSCULAR | Status: AC
  Filled 2018-01-31: qty 50

## 2018-01-31 MED ORDER — SUGAMMADEX SODIUM 200 MG/2ML IV SOLN
INTRAVENOUS | Status: DC | PRN
  Administered 2018-01-31: 175 mg via INTRAVENOUS

## 2018-01-31 MED ORDER — ACETAMINOPHEN 500 MG PO TABS
1000.0000 mg | ORAL_TABLET | Freq: Two times a day (BID) | ORAL | Status: DC
  Administered 2018-01-31 – 2018-02-01 (×2): 1000 mg via ORAL
  Filled 2018-01-31 (×2): qty 2

## 2018-01-31 MED ORDER — INSULIN ASPART 100 UNIT/ML ~~LOC~~ SOLN
0.0000 [IU] | SUBCUTANEOUS | Status: DC
  Administered 2018-01-31: 3 [IU] via SUBCUTANEOUS
  Administered 2018-01-31: 5 [IU] via SUBCUTANEOUS
  Administered 2018-02-01: 3 [IU] via SUBCUTANEOUS
  Administered 2018-02-01: 2 [IU] via SUBCUTANEOUS
  Administered 2018-02-01 (×3): 3 [IU] via SUBCUTANEOUS
  Administered 2018-02-01 – 2018-02-02 (×2): 2 [IU] via SUBCUTANEOUS

## 2018-01-31 MED ORDER — EPHEDRINE SULFATE-NACL 50-0.9 MG/10ML-% IV SOSY
PREFILLED_SYRINGE | INTRAVENOUS | Status: DC | PRN
  Administered 2018-01-31 (×2): 10 mg via INTRAVENOUS

## 2018-01-31 MED ORDER — ENOXAPARIN SODIUM 40 MG/0.4ML ~~LOC~~ SOLN
40.0000 mg | SUBCUTANEOUS | Status: DC
  Administered 2018-02-01 – 2018-02-02 (×2): 40 mg via SUBCUTANEOUS
  Filled 2018-01-31 (×2): qty 0.4

## 2018-01-31 MED ORDER — AMLODIPINE BESYLATE 5 MG PO TABS
5.0000 mg | ORAL_TABLET | Freq: Every morning | ORAL | Status: DC
  Administered 2018-02-01 – 2018-02-02 (×2): 5 mg via ORAL
  Filled 2018-01-31 (×2): qty 1

## 2018-01-31 MED ORDER — BUPIVACAINE LIPOSOME 1.3 % IJ SUSP
20.0000 mL | Freq: Once | INTRAMUSCULAR | Status: AC
  Administered 2018-01-31: 20 mL
  Filled 2018-01-31: qty 20

## 2018-01-31 MED ORDER — BUPIVACAINE HCL (PF) 0.25 % IJ SOLN
INTRAMUSCULAR | Status: AC
  Filled 2018-01-31: qty 30

## 2018-01-31 MED ORDER — FENTANYL CITRATE (PF) 100 MCG/2ML IJ SOLN: 25.0000 ug | INTRAMUSCULAR | Status: DC | PRN

## 2018-01-31 MED ORDER — CIPROFLOXACIN IN D5W 400 MG/200ML IV SOLN
400.0000 mg | INTRAVENOUS | Status: AC
  Administered 2018-01-31: 400 mg via INTRAVENOUS
  Filled 2018-01-31: qty 200

## 2018-01-31 MED ORDER — ROCURONIUM BROMIDE 10 MG/ML (PF) SYRINGE
PREFILLED_SYRINGE | INTRAVENOUS | Status: DC | PRN
  Administered 2018-01-31: 50 mg via INTRAVENOUS
  Administered 2018-01-31 (×3): 10 mg via INTRAVENOUS
  Administered 2018-01-31: 20 mg via INTRAVENOUS

## 2018-01-31 MED ORDER — OXYCODONE HCL 5 MG PO TABS
5.0000 mg | ORAL_TABLET | ORAL | Status: DC | PRN
  Administered 2018-01-31 – 2018-02-01 (×2): 5 mg via ORAL
  Filled 2018-01-31 (×2): qty 1

## 2018-01-31 MED ORDER — FENTANYL CITRATE (PF) 250 MCG/5ML IJ SOLN
INTRAMUSCULAR | Status: AC
Start: 2018-01-31 — End: 2018-01-31
  Filled 2018-01-31: qty 5

## 2018-01-31 MED ORDER — LIDOCAINE 2% (20 MG/ML) 5 ML SYRINGE
INTRAMUSCULAR | Status: AC
  Filled 2018-01-31: qty 5

## 2018-01-31 MED ORDER — PREGABALIN 50 MG PO CAPS
50.0000 mg | ORAL_CAPSULE | Freq: Two times a day (BID) | ORAL | Status: DC
  Administered 2018-02-01 – 2018-02-02 (×3): 50 mg via ORAL
  Filled 2018-01-31 (×3): qty 1

## 2018-01-31 MED ORDER — BUPIVACAINE-EPINEPHRINE 0.5% -1:200000 IJ SOLN
INTRAMUSCULAR | Status: AC
  Filled 2018-01-31: qty 1

## 2018-01-31 MED ORDER — LEVOTHYROXINE SODIUM 100 MCG PO TABS
100.0000 ug | ORAL_TABLET | Freq: Every day | ORAL | Status: DC
  Administered 2018-02-01 – 2018-02-02 (×2): 100 ug via ORAL
  Filled 2018-01-31 (×2): qty 1

## 2018-01-31 MED ORDER — LOSARTAN POTASSIUM 50 MG PO TABS
100.0000 mg | ORAL_TABLET | Freq: Every morning | ORAL | Status: DC
  Administered 2018-02-01 – 2018-02-02 (×2): 100 mg via ORAL
  Filled 2018-01-31 (×2): qty 2

## 2018-01-31 MED ORDER — CHEWING GUM (ORBIT) SUGAR FREE
1.0000 | CHEWING_GUM | Freq: Three times a day (TID) | ORAL | Status: DC
  Administered 2018-01-31 – 2018-02-02 (×5): 1 via ORAL
  Filled 2018-01-31: qty 1

## 2018-01-31 MED ORDER — FLUOXETINE HCL 20 MG PO CAPS
20.0000 mg | ORAL_CAPSULE | Freq: Every morning | ORAL | Status: DC
  Administered 2018-02-01 – 2018-02-02 (×2): 20 mg via ORAL
  Filled 2018-01-31 (×2): qty 1

## 2018-01-31 SURGICAL SUPPLY — 80 items
BENZOIN TINCTURE PRP APPL 2/3 (GAUZE/BANDAGES/DRESSINGS) ×3 IMPLANT
BLADE EXTENDED COATED 6.5IN (ELECTRODE) ×3 IMPLANT
CHLORAPREP W/TINT 26ML (MISCELLANEOUS) ×3 IMPLANT
CLIP VESOCCLUDE LG 6/CT (CLIP) ×3 IMPLANT
CLIP VESOCCLUDE MED 6/CT (CLIP) ×9 IMPLANT
CLIP VESOCCLUDE MED LG 6/CT (CLIP) ×3 IMPLANT
CLOSURE WOUND 1/2 X4 (GAUZE/BANDAGES/DRESSINGS) ×1
COVER BACK TABLE 60X90IN (DRAPES) IMPLANT
COVER TIP SHEARS 8 DVNC (MISCELLANEOUS) ×1 IMPLANT
COVER TIP SHEARS 8MM DA VINCI (MISCELLANEOUS) ×2
DERMABOND ADVANCED (GAUZE/BANDAGES/DRESSINGS) ×2
DERMABOND ADVANCED .7 DNX12 (GAUZE/BANDAGES/DRESSINGS) ×1 IMPLANT
DRAPE ARM DVNC X/XI (DISPOSABLE) ×4 IMPLANT
DRAPE COLUMN DVNC XI (DISPOSABLE) ×1 IMPLANT
DRAPE DA VINCI XI ARM (DISPOSABLE) ×8
DRAPE DA VINCI XI COLUMN (DISPOSABLE) ×2
DRAPE SHEET LG 3/4 BI-LAMINATE (DRAPES) ×6 IMPLANT
DRAPE SURG IRRIG POUCH 19X23 (DRAPES) ×3 IMPLANT
DRAPE UNDERBUTTOCKS STRL (DRAPE) ×3 IMPLANT
DRSG OPSITE POSTOP 4X12 (GAUZE/BANDAGES/DRESSINGS) ×3 IMPLANT
FLOSEAL 10ML (HEMOSTASIS) ×3 IMPLANT
GAUZE SPONGE 2X2 8PLY STRL LF (GAUZE/BANDAGES/DRESSINGS) ×1 IMPLANT
GLOVE BIO SURGEON STRL SZ 6.5 (GLOVE) ×4 IMPLANT
GLOVE BIO SURGEONS STRL SZ 6.5 (GLOVE) ×2
GLOVE BIOGEL PI IND STRL 7.0 (GLOVE) ×3 IMPLANT
GLOVE BIOGEL PI INDICATOR 7.0 (GLOVE) ×6
GLOVE SURG SS PI 6.5 STRL IVOR (GLOVE) ×9 IMPLANT
GOWN STRL REUS W/ TWL LRG LVL3 (GOWN DISPOSABLE) ×1 IMPLANT
GOWN STRL REUS W/TWL LRG LVL3 (GOWN DISPOSABLE) ×8 IMPLANT
HANDLE SUCTION POOLE (INSTRUMENTS) ×1 IMPLANT
HOLDER FOLEY CATH W/STRAP (MISCELLANEOUS) ×3 IMPLANT
IRRIG SUCT STRYKERFLOW 2 WTIP (MISCELLANEOUS) ×3
IRRIGATION SUCT STRKRFLW 2 WTP (MISCELLANEOUS) ×1 IMPLANT
LIGASURE IMPACT 36 18CM CVD LR (INSTRUMENTS) ×3 IMPLANT
MARKER SKIN DUAL TIP RULER LAB (MISCELLANEOUS) ×3 IMPLANT
NEEDLE HYPO 22GX1.5 SAFETY (NEEDLE) ×3 IMPLANT
OBTURATOR OPTICAL STANDARD 8MM (TROCAR) ×2
OBTURATOR OPTICAL STND 8 DVNC (TROCAR) ×1
OBTURATOR OPTICALSTD 8 DVNC (TROCAR) ×1 IMPLANT
PACK ROBOT GYN CUSTOM WL (TRAY / TRAY PROCEDURE) ×3 IMPLANT
PAD POSITIONING PINK XL (MISCELLANEOUS) ×3 IMPLANT
PENCIL HANDSWITCHING (ELECTRODE) ×3 IMPLANT
SCISSORS LAP 5X35 DISP (ENDOMECHANICALS) IMPLANT
SEAL CANN UNIV 5-8 DVNC XI (MISCELLANEOUS) ×4 IMPLANT
SEAL XI 5MM-8MM UNIVERSAL (MISCELLANEOUS) ×8
SEPRAFILM MEMBRANE 5X6 (MISCELLANEOUS) ×3 IMPLANT
SET TRI-LUMEN FLTR TB AIRSEAL (TUBING) ×3 IMPLANT
SOLUTION ELECTROLUBE (MISCELLANEOUS) ×3 IMPLANT
SPONGE GAUZE 2X2 STER 10/PKG (GAUZE/BANDAGES/DRESSINGS) ×2
SPONGE LAP 18X18 X RAY DECT (DISPOSABLE) ×12 IMPLANT
STRIP CLOSURE SKIN 1/2X4 (GAUZE/BANDAGES/DRESSINGS) ×2 IMPLANT
SUCTION POOLE HANDLE (INSTRUMENTS) ×3
SUT MNCRL AB 4-0 PS2 18 (SUTURE) ×6 IMPLANT
SUT PDS AB 1 TP1 96 (SUTURE) ×6 IMPLANT
SUT VIC AB 0 CT1 18XCR BRD 8 (SUTURE) ×1 IMPLANT
SUT VIC AB 0 CT1 8-18 (SUTURE) ×2
SUT VIC AB 0 CT2 27 (SUTURE) IMPLANT
SUT VIC AB 0 UR5 27 (SUTURE) IMPLANT
SUT VIC AB 4-0 PC3 18 (SUTURE) ×6 IMPLANT
SUT VIC AB 4-0 PS2 27 (SUTURE) ×9 IMPLANT
SUT VICRYL 0 TIES 12 18 (SUTURE) ×3 IMPLANT
SUT VICRYL 2 0 18  UND BR (SUTURE) ×2
SUT VICRYL 2 0 18 UND BR (SUTURE) ×1 IMPLANT
SUT VLOC 180 0 9IN  GS21 (SUTURE)
SUT VLOC 180 0 9IN GS21 (SUTURE) IMPLANT
SYS RETRIEVAL 5MM INZII UNIV (BASKET) ×3
SYSTEM RETRIEVL 5MM INZII UNIV (BASKET) ×1 IMPLANT
TAPE CLOTH SURG 4X10 WHT LF (GAUZE/BANDAGES/DRESSINGS) ×3 IMPLANT
TIP UTERINE 5.1X6CM LAV DISP (MISCELLANEOUS) IMPLANT
TIP UTERINE 6.7X10CM GRN DISP (MISCELLANEOUS) IMPLANT
TIP UTERINE 6.7X6CM WHT DISP (MISCELLANEOUS) IMPLANT
TIP UTERINE 6.7X8CM BLUE DISP (MISCELLANEOUS) IMPLANT
TOWEL OR NON WOVEN STRL DISP B (DISPOSABLE) ×6 IMPLANT
TRAP SPECIMEN MUCOUS 40CC (MISCELLANEOUS) ×3 IMPLANT
TRAY FOLEY W/METER SILVER 16FR (SET/KITS/TRAYS/PACK) ×3 IMPLANT
TROCAR BLADELESS OPT 12M 100M (ENDOMECHANICALS) ×3 IMPLANT
TROCAR PORT AIRSEAL 5X120 (TROCAR) ×3 IMPLANT
UNDERPAD 30X30 (UNDERPADS AND DIAPERS) ×6 IMPLANT
WATER STERILE IRR 1000ML POUR (IV SOLUTION) IMPLANT
YANKAUER SUCT BULB TIP 10FT TU (MISCELLANEOUS) ×3 IMPLANT

## 2018-01-31 NOTE — Anesthesia Postprocedure Evaluation (Signed)
Anesthesia Post Note  Patient: [name]  Procedure(s) Performed: XI ROBOTIC ASSISTED BILATERAL SALPINGO OOPHORECTOMY, STAGING, LAPAROTOMY, PELVIC AND PARA AORTIC LYMPH NODE DISSECTION, OMENTECTOMY (Bilateral )     Patient location during evaluation: PACU Anesthesia Type: General Level of consciousness: awake Pain management: pain level controlled Vital Signs Assessment: post-procedure vital signs reviewed and stable Respiratory status: spontaneous breathing Cardiovascular status: stable Anesthetic complications: no    Last Vitals:  Vitals:   01/31/18 1415 01/31/18 1425  BP: 137/70 137/70  Pulse: 82 86  Resp: 14 16  Temp:  36.4 C  SpO2: 98% 98%    Last Pain:  Vitals:   01/31/18 1415  TempSrc:   PainSc: Asleep                 Mataeo Ingwersen

## 2018-01-31 NOTE — Op Note (Signed)
Preoperative Diagnosis:  1.  Left adnexal masses. 2.  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  Procedure(s) Performed:  1. Robotic BSO and washings. 2. Exploratory laparotomy, Staging including infragastric omentectomy, Bilateral pelvic and paraaortic lymphadenectomy, pertioneal biopsies.  Surgeon: Mart Piggs, MD   Assistant: Lahoma Crocker (an MD assistant was necessary for tissue manipulation, management of instrumentation, retraction and positioning due to the complexity of the case and hospital policies).  Specimens: Bilateral tubes / ovaries, bilateral pelvic and paraaortic lymph nodes, peritoneal biopsies, washings and omentum.   Estimated Blood Loss: 200 mL.    Complications: none  Operative Findings: The left adnexa was adherent to the left pelvic peritoneum suspected due to the inferior retraction from the vaginal hysterectomy. Intraoperative leakage of cystic fluid from left ovary.  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  The right adnexa grossly appeared normal although slightly enlarged for her age.  There were some indurated lymph nodes but no overtly malignant lymph nodes were suspected.  Small bowel mesentery had 3-4 superficial possible implants.  1 of these was sent for frozen section returned benign.  She did have adhesive disease from her open cholecystectomy in the right upper quadrant.  No other evidence of disease in the abdomen or pelvis on palpation; specifically including the small bowel stomach and large bowel.  Procedure:   After induction of anesthesia, the patient was draped and prepped in the usual sterile manner.  She was prepped and draped in the normal sterile fashion in the dorsal lithotomy position. Timeout was performed.    A Foley catheter was placed to gravity by me.  A sponge on a stick was placed per vagina. A 72mm incision was made in the  left upper quadrant palmer's point and a 5 mm Optiview trocar used to enter the abdomen under direct visualization. With entry into the abdomen and then maintenance of 15 mm of mercury the patient was placed in Trendelenburg position. An incision was made superior to the umbilicus and used for an 43mm trocar. To the right of this 2 additional 107mm trocars were placed approximately 6-8 cm from the closest trocar. An 55mm trocar was also placed in the left lateral abdominal wall. 8 mm robotic trochars were inserted. The 6mm LUQ trocar was changed out to 31mm airseal. The robot was docked.  The abdomen was inspected as was the pelvis.  Findings as noted. Pelvic washings were obtained. The mass/cyst was noted on the left. An incision was made on the left pelvic side wall peritoneum parallel to the IP ligament and the retroperitoneal space entered. The left ureter was identified and the para-rectal space was developed. A window was created in the broad ligament above the ureter. The infundibulopelvic vessels were skeletonized cauterized and transected. The left presumed residual utero-ovarian and round ligaments similarly were cauterized and transected.  I did trace the left ureter down to the area of dissection in the lower pelvis where the adnexa was adherent to the pelvic peritoneum.  In some areas this was quite dense adhesive disease there was quite a bit of manipulation required of the mass to safely resect from the surrounding structures.  One point we did note a leak in the wall and clear cystic fluid extruded.  Ultimately we were able to free the specimen in its entirety.   In a similar manner the contralateral adnexa was isolated, the spaces opened, the ureter identified, and the IP  ligament isolated, clamped, coagulated and transected.  The adnexa was then separated from its residual adhesions to the pelvic peritoneum and presumed remnant utero-ovarian and round ligament.    The specimen was then removed  under visualization through a separate incision made just above the pubic hairline.  A 12 mm trocar was inserted in this region under direct visualization.  A 10 mm Endo Catch bag was then inserted and both adnexa were placed into the bag without difficulty.  The specimen was then delivered through this incision.  While awaiting frozen section I did close the site of specimen removal using a lap scopic needle driver and 0 Vicryl.  The skin was then closed with 4-0 Vicryl and 4-0 Monocryl in a subcutaneous fashion.  Fortunately the frozen section returned with a high-grade carcinoma suspected GYN primary defer to permanent.  Therefore a midline vertical incision was made and carried through the subcutaneous tissue to the fascia. The fascial incision was made and extended superiorally. The rectus muscles were separated. The peritoneum was identified and entered starting at the prior suprapubic incision. Peritoneal incision was extended longitudinally.  The lysis was performed in the right upper quadrant from the prior cholecystectomy.  A Bookwalter retractor was then placed. A survey of the abdomen and pelvis revealed the above findings.   The IP ligaments were elevated and retied.  Infra-gastric omentectomy was performed with the assistance of the LigaSure. Peritoneal biopsies of the bilateral diaphragms was performed.    While running the small bowel small areas of superficial questionable implants were noted.  These were completely resected and a portion sent for frozen section which returned with benign tissue.  Therefore lymphadenectomy for staging purposes was performed  I developed the left and right paravesical and pararectal spaces and performed a bilateral pelvic lymph node dissection with the following borders: proximally the bifurcation of the common iliac, distally the circumflex iliac vein, laterally the genitofemoral nerve, the medial border was the superior vesicle artery and the deep  border was the obturator nerve.   Right and left paraaortic lymphadenectomy was performed on the right via direct transperitoneal approach.  On the left and indirect approach.  No complications noted hemostasis.  Peritoneal biopsies of the pelvis bladder, cul de sac, and colic gutters, were collected.  Exploration including the small bowel large bowel stomach and retroperitoneum (via indirect palpation) was performed with no additional findings.  There was some bleeding from the left pelvic sidewall.  Clips and ties were used to provide hemostasis.  Irrigation was used.  FloSeal was placed.  Seprafilm was placed in the midline abdominal fascia. The fascia was reapproximated with 0 looped PDS using a total of two sutures. The subcutaneous layer was then irrigated copiously.  Local anesthetic was injected.  The subcutaneous layer was then reapproximated with interrupted 4-0 Vicryl and then running 4-0 Monocryl.    The patient tolerated the procedure well. Sponge, lap and needle counts were correct x 2.

## 2018-01-31 NOTE — Transfer of Care (Signed)
Immediate Anesthesia Transfer of Care Note  Patient: [name]  Procedure(s) Performed: XI ROBOTIC ASSISTED BILATERAL SALPINGO OOPHORECTOMY, STAGING, LAPAROTOMY, PELVIC AND PARA AORTIC LYMPH NODE DISSECTION, OMENTECTOMY (Bilateral )  Patient Location: PACU  Anesthesia Type:General  Level of Consciousness: awake, alert  and oriented  Airway & Oxygen Therapy: Patient Spontanous Breathing and Patient connected to face mask oxygen  Post-op Assessment: Report given to RN and Post -op Vital signs reviewed and stable  Post vital signs: Reviewed and stable  Last Vitals:  Vitals Value Taken Time  BP 134/67 01/31/2018  1:05 PM  Temp 36.4 C 01/31/2018  1:05 PM  Pulse 80 01/31/2018  1:10 PM  Resp 15 01/31/2018  1:10 PM  SpO2 100 % 01/31/2018  1:10 PM  Vitals shown include unvalidated device data.  Last Pain:  Vitals:   01/31/18 1305  TempSrc:   PainSc: Asleep         Complications: No apparent anesthesia complications

## 2018-01-31 NOTE — Anesthesia Preprocedure Evaluation (Signed)
Anesthesia Evaluation  Patient identified by MRN, date of birth, ID band Patient awake    History of Anesthesia Complications (+) PONV  Airway Mallampati: II  TM Distance: >3 FB     Dental   Pulmonary pneumonia,    breath sounds clear to auscultation       Cardiovascular hypertension, + Peripheral Vascular Disease   Rhythm:Regular Rate:Normal     Neuro/Psych    GI/Hepatic GERD  ,  Endo/Other  diabetesHypothyroidism   Renal/GU      Musculoskeletal  (+) Arthritis ,   Abdominal   Peds  Hematology  (+) anemia ,   Anesthesia Other Findings   Reproductive/Obstetrics                             Anesthesia Physical Anesthesia Plan  ASA: III  Anesthesia Plan: General   Post-op Pain Management:    Induction: Intravenous  PONV Risk Score and Plan: 4 or greater and Treatment may vary due to age or medical condition  Airway Management Planned: Oral ETT  Additional Equipment:   Intra-op Plan:   Post-operative Plan: Possible Post-op intubation/ventilation  Informed Consent: I have reviewed the patients History and Physical, chart, labs and discussed the procedure including the risks, benefits and alternatives for the proposed anesthesia with the patient or authorized representative who has indicated his/her understanding and acceptance.   Dental advisory given  Plan Discussed with:   Anesthesia Plan Comments:         Anesthesia Quick Evaluation

## 2018-01-31 NOTE — Interval H&P Note (Signed)
History and Physical Interval Note:  01/31/2018 7:15 AM  [name]  has presented today for surgery, with the diagnosis of ovarian mass  The various methods of treatment have been discussed with the patient and family. After consideration of risks, benefits and other options for treatment, the patient has consented to  Procedure(s): XI ROBOTIC ASSISTED BILATERAL SALPINGO OOPHORECTOMY, POSSIBLE STAGING, POSSIBLE LAPAROTOMY (Bilateral) as a surgical intervention .  The patient's history has been reviewed, patient examined, no change in status, stable for surgery.  I have reviewed the patient's chart and labs.  Questions were answered to the patient's satisfaction.     [name]

## 2018-01-31 NOTE — Anesthesia Procedure Notes (Signed)
Procedure Name: Intubation Date/Time: 01/31/2018 8:44 AM Performed by: Lucretia Pendley D, CRNA Pre-anesthesia Checklist: Patient identified, Emergency Drugs available, Suction available and Patient being monitored Patient Re-evaluated:Patient Re-evaluated prior to induction Oxygen Delivery Method: Circle system utilized Preoxygenation: Pre-oxygenation with 100% oxygen Induction Type: IV induction Ventilation: Mask ventilation without difficulty Laryngoscope Size: Mac and 4 Grade View: Grade I Tube type: Oral Tube size: 7.5 mm Number of attempts: 1 Airway Equipment and Method: Stylet Placement Confirmation: ETT inserted through vocal cords under direct vision,  positive ETCO2 and breath sounds checked- equal and bilateral Secured at: 21 cm Tube secured with: Tape Dental Injury: Teeth and Oropharynx as per pre-operative assessment

## 2018-02-01 ENCOUNTER — Other Ambulatory Visit: Payer: Self-pay

## 2018-02-01 ENCOUNTER — Encounter (HOSPITAL_COMMUNITY): Payer: Self-pay | Admitting: Obstetrics

## 2018-02-01 LAB — GLUCOSE, CAPILLARY
GLUCOSE-CAPILLARY: 161 mg/dL — AB (ref 65–99)
Glucose-Capillary: 137 mg/dL — ABNORMAL HIGH (ref 65–99)
Glucose-Capillary: 145 mg/dL — ABNORMAL HIGH (ref 65–99)
Glucose-Capillary: 149 mg/dL — ABNORMAL HIGH (ref 65–99)
Glucose-Capillary: 152 mg/dL — ABNORMAL HIGH (ref 65–99)
Glucose-Capillary: 154 mg/dL — ABNORMAL HIGH (ref 65–99)
Glucose-Capillary: 186 mg/dL — ABNORMAL HIGH (ref 65–99)

## 2018-02-01 LAB — CBC
HCT: 38.5 % (ref 36.0–46.0)
HEMOGLOBIN: 12.6 g/dL (ref 12.0–15.0)
MCH: 29.3 pg (ref 26.0–34.0)
MCHC: 32.7 g/dL (ref 30.0–36.0)
MCV: 89.5 fL (ref 78.0–100.0)
Platelets: 223 10*3/uL (ref 150–400)
RBC: 4.3 MIL/uL (ref 3.87–5.11)
RDW: 14.7 % (ref 11.5–15.5)
WBC: 8.5 10*3/uL (ref 4.0–10.5)

## 2018-02-01 LAB — BASIC METABOLIC PANEL WITH GFR
Anion gap: 9 (ref 5–15)
BUN: 15 mg/dL (ref 6–20)
CO2: 25 mmol/L (ref 22–32)
Calcium: 8.7 mg/dL — ABNORMAL LOW (ref 8.9–10.3)
Chloride: 103 mmol/L (ref 101–111)
Creatinine, Ser: 0.58 mg/dL (ref 0.44–1.00)
GFR calc Af Amer: >60 mL/min
GFR calc non Af Amer: >60 mL/min
Glucose, Bld: 153 mg/dL — ABNORMAL HIGH (ref 65–99)
Potassium: 4.3 mmol/L (ref 3.5–5.1)
Sodium: 137 mmol/L (ref 135–145)

## 2018-02-01 LAB — CANCER ANTIGEN 19-9: CA 19-9: 22 U/mL (ref 0–35)

## 2018-02-01 LAB — CEA: CEA: 1.1 ng/mL (ref 0.0–4.7)

## 2018-02-01 MED ORDER — HYDROCODONE-ACETAMINOPHEN 5-325 MG PO TABS
1.0000 | ORAL_TABLET | ORAL | Status: DC | PRN
  Administered 2018-02-01 – 2018-02-02 (×2): 2 via ORAL
  Filled 2018-02-01 (×2): qty 2

## 2018-02-01 NOTE — Progress Notes (Signed)
OT Cancellation Note  Patient Details Name: [name] MRN: [id number] DOB: 04-Jul-1941   Cancelled Treatment:    Reason Eval/Treat Not Completed: OT screened, no needs identified, will sign off.  Pt up in room moving ad lib. She states she doesn't need OT. She was able to don underwear this am.  [name] 02/01/2018, 1:04 PM  Lesle Chris, OTR/L [phone] 02/01/2018

## 2018-02-01 NOTE — Evaluation (Addendum)
Physical Therapy Evaluation Patient Details Name: [name] MRN: [id number] DOB: [birth year] Today's Date: 02/01/2018   History of Present Illness  77 y/o female s/p XI ROBOTIC ASSISTED BILATERAL SALPINGO OOPHORECTOMY (01/31/18) for removal of mass. 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  Clinical Impression  Pt admitted with above diagnosis. Pt currently with functional limitations due to the deficits listed below (see PT Problem List). Pt will benefit from skilled PT to increase their independence and safety with mobility to allow discharge to the venue listed below.  PT eval was limited with gait due to finishing up gait with friend upon arrival.  PT educated pt on use of pillow as brace for bedmobility, coughing, etc. Pt with abdomen pain, but was very happy that her back pain was gone.  She states she had been having to hold onto furniture due to back pain. She may do well enough to not need HHPT.  Will continue to assess.    Follow Up Recommendations Home health PT(depending on progress)    Equipment Recommendations  None recommended by PT    Recommendations for Other Services OT consult     Precautions / Restrictions Precautions Precautions: None Restrictions Weight Bearing Restrictions: No      Mobility  Bed Mobility Overal bed mobility: Needs Assistance Bed Mobility: Rolling;Sidelying to Sit;Sit to Sidelying Rolling: Supervision Sidelying to sit: Min guard     Sit to sidelying: Supervision General bed mobility comments: Pt educated on proper body mechanics.  She used pillow to brace abdomen with bed mobility and needed A with sidelying > sit with holding pillow so she could use her UE.  Transfers Overall transfer level: Needs assistance Equipment used: Rolling walker (2 wheeled);None Transfers: Sit to/from [employer] to Stand: Supervision Stand pivot transfers: Supervision       General transfer comment: pt with good  use of UE  Ambulation/Gait   Ambulation Distance (Feet): 10 Feet Assistive device: Rolling walker (2 wheeled) Gait Pattern/deviations: Decreased step length - right;Decreased step length - left     General Gait Details: Pt just finishing up gait with friend and saw last 10' of gait, but she had walked a lap with RW.  Stairs            Wheelchair Mobility    Modified Rankin (Stroke Patients Only)       Balance Overall balance assessment: Needs assistance   Sitting balance-Leahy Scale: Good     Standing balance support: During functional activity Standing balance-Leahy Scale: Fair                               Pertinent Vitals/Pain Pain Assessment: 0-10 Pain Score: 9  Pain Location: abdomen Pain Intervention(s): Monitored during session;Repositioned;Other (comment)(nurse getting pt a hot pack and possibly binder)    Home Living Family/patient expects to be discharged to:: Private residence Living Arrangements: Alone Available Help at Discharge: Family;Available PRN/intermittently Type of Home: House Home Access: Stairs to enter Entrance Stairs-Rails: Right   Home Layout: One level Home Equipment: Walker - 2 wheels;Cane - single point;Bedside commode Additional Comments: She likes to do yardwork.    Prior Function Level of Independence: Independent               Hand Dominance        Extremity/Trunk Assessment   Upper Extremity Assessment Upper Extremity Assessment: Defer to OT evaluation  Lower Extremity Assessment Lower Extremity Assessment: Overall WFL for tasks assessed;RLE deficits/detail RLE Deficits / Details: did not fully assess due to glut medius tear (she has had about 1 year)       Communication   Communication: No difficulties  Cognition Arousal/Alertness: Awake/alert Behavior During Therapy: WFL for tasks assessed/performed Overall Cognitive Status: Within Functional Limits for tasks assessed                                         General Comments      Exercises     Assessment/Plan    PT Assessment Patient needs continued PT services  PT Problem List Decreased mobility;Decreased activity tolerance       PT Treatment Interventions DME instruction;Gait training;Stair training;Functional mobility training;Therapeutic exercise;Therapeutic activities    PT Goals (Current goals can be found in the Care Plan section)  Acute Rehab PT Goals Patient Stated Goal: decrease pain PT Goal Formulation: With patient Time For Goal Achievement: 02/15/18 Potential to Achieve Goals: Good    Frequency Min 3X/week   Barriers to discharge        Co-evaluation               AM-PAC PT "6 Clicks" Daily Activity  Outcome Measure Difficulty turning over in bed (including adjusting bedclothes, sheets and blankets)?: A Little Difficulty moving from lying on back to sitting on the side of the bed? : Unable Difficulty sitting down on and standing up from a chair with arms (e.g., wheelchair, bedside commode, etc,.)?: A Little Help needed moving to and from a bed to chair (including a wheelchair)?: A Little Help needed walking in hospital room?: A Little Help needed climbing 3-5 steps with a railing? : A Little 6 Click Score: 16    End of Session Equipment Utilized During Treatment: Gait belt Activity Tolerance: Patient tolerated treatment well Patient left: in chair Nurse Communication: Mobility status PT Visit Diagnosis: Difficulty in walking, not elsewhere classified (R26.2)    Time: 8295-6213 PT Time Calculation (min) (ACUTE ONLY): 20 min   Charges:   PT Evaluation $PT Eval Low Complexity: 1 Low     PT G Codes:        Malorie Bigford L. Tamala Julian, [place] Pager [phone] 02/01/2018   Galen Manila 02/01/2018, 10:26 AM

## 2018-02-01 NOTE — Progress Notes (Signed)
Pt's insurance, [employer],  will not pay for the Lovenox shot when she goes home.  Per the pt, they will only pay for what we give her here.

## 2018-02-01 NOTE — Progress Notes (Addendum)
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  Stating oxycodone did not assist with pain relief but kept her up all night.  She did receive pain medication through her IV that helped with her pain.  She ambulated last pm without difficulty.  Voiding since foley removal.  Several questions voiced about her surgery and chemotherapy.  All questions answered.  No concerns voiced at end of visit.  Objective: Vital signs in last 24 hours: Temp:  [97.5 F (36.4 C)-98.8 F (37.1 C)] 98.8 F (37.1 C) (04/19 1004) Pulse Rate:  [70-89] 73 (04/19 1004) Resp:  [14-23] 18 (04/19 1004) BP: (125-168)/(61-101) 140/79 (04/19 1004) SpO2:  [92 %-100 %] 92 % (04/19 1004) Weight:  [186 lb 15.2 oz (84.8 kg)] 186 lb 15.2 oz (84.8 kg) (04/19 0541) Last BM Date: 01/30/18  Intake/Output from previous day: 04/18 0701 - 04/19 0700 In: 4846.2 [P.O.:510; I.V.:4086.2; IV Piggyback:250] Out: 2725 [Urine:2425; Blood:300]  Physical Examination: General: alert, cooperative and no distress Resp: clear to auscultation bilaterally Cardio: regular rate and rhythm, S1, S2 normal, no murmur, click, rub or gallop GI: incision: lap sites with dermabond without drainage, midline incision with op site dressing in place-stained but dry and abdomen soft, hypoactive bowel sounds, non-tympanic Extremities: extremities normal, atraumatic, no cyanosis or edema  Labs: WBC/Hgb/Hct/Plts:  8.5/12.6/38.5/223 (04/19 0510) BUN/Cr/glu/ALT/AST/amyl/lip:  15/0.58/--/--/--/--/-- (04/19 0510)  Assessment: 77 y.o. 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 Plan to  change oxycodone to hydrocodone per pt request  Heme: Hgb 12.6 and Hct 38.5 this am.  Stable post-operatively.  CV: BP and HR stable post-op.  Continue to monitor with ordered vital signs.  GI:  Tolerating po: Yes.  Antiemetics ordered PRN.  GU: Voiding since foley removal.    FEN: Stable post-op.  Endocrine: Diabetes Type 2- sliding scale and lantus ordered CBG (last 3)  Recent Labs    02/01/18 0001 02/01/18 0337 02/01/18 0800  GLUCAP 186* 154* 145*    Prophylaxis: SCDs and lovenox.  Plan: Diet to regular Saline lock IV if regular diet tolerated later today Discontinue oxycodone and begin hydrocodone/APAP PRN for pain Encourage ambulation, IS use, deep breathing, and coughing Continue post-op plan of care per Dr. Everitt Amy   LOS: 1 day    [name] 02/01/2018, 10:58 AM

## 2018-02-01 NOTE — Progress Notes (Signed)
Pt called [employer] again and they stated it would be $10,000 for Lovenox brand and $250.00 for the generic.  Pt either wants to be put on generic or another type of blood thinner.

## 2018-02-01 NOTE — Progress Notes (Signed)
Spoke with patient and daughter at bedside. Discussed recommendation for HHPT, patient declines at this time. She feels much improved since surgery, back and hip are much improved. She is ambulating in the room independently. Has RW in room but does not use because she feels so much better. Anticipating d/c on Monday, will f/u then if still here. [phone]

## 2018-02-02 LAB — GLUCOSE, CAPILLARY
GLUCOSE-CAPILLARY: 123 mg/dL — AB (ref 65–99)
GLUCOSE-CAPILLARY: 142 mg/dL — AB (ref 65–99)
Glucose-Capillary: 117 mg/dL — ABNORMAL HIGH (ref 65–99)
Glucose-Capillary: 122 mg/dL — ABNORMAL HIGH (ref 65–99)

## 2018-02-02 MED ORDER — METFORMIN HCL ER 500 MG PO TB24: 1000.0000 mg | ORAL_TABLET | Freq: Two times a day (BID) | ORAL | Status: DC

## 2018-02-02 MED ORDER — INSULIN ASPART 100 UNIT/ML ~~LOC~~ SOLN: 0.0000 [IU] | Freq: Three times a day (TID) | SUBCUTANEOUS | Status: DC

## 2018-02-02 MED ORDER — METFORMIN HCL ER 500 MG PO TB24: 500.0000 mg | ORAL_TABLET | Freq: Two times a day (BID) | ORAL | Status: DC

## 2018-02-02 MED ORDER — HYDROCODONE-ACETAMINOPHEN 5-325 MG PO TABS: 1.0000 | ORAL_TABLET | ORAL | 0 refills | Status: DC | PRN

## 2018-02-02 MED ORDER — ENOXAPARIN SODIUM 40 MG/0.4ML ~~LOC~~ SOLN: 40.0000 mg | SUBCUTANEOUS | 0 refills | Status: DC

## 2018-02-02 MED ORDER — EXENATIDE 10 MCG/0.04ML ~~LOC~~ SOPN: 10.0000 ug | PEN_INJECTOR | Freq: Two times a day (BID) | SUBCUTANEOUS | Status: DC

## 2018-02-02 NOTE — Discharge Planning (Signed)
Patient IV removed.  RN assessment and VS revealed stability for DC to home.  Discharge papers given, explained and educated.  Informed of surgical site care and what s/sx of infection would indicate need to call the Dr.  Rockey Situ of suggested FU appt and appt made.  Paper scripts printed, signed and given.  Once ready, wheeled to front and family transporting home via car.

## 2018-02-02 NOTE — Discharge Summary (Signed)
Physician Discharge Summary  Patient ID: [name] MRN: [id number] DOB/AGE: [birth year] 77 y.o.  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   The postoperative course was uneventful.  Her CBGs remained in range.  She had a PT evaluation and there were no needs identified. She was discharged to home on postoperative day 2 tolerating a regular diet and meeting all postoperative goals.  Consults: PT  Significant Diagnostic Studies: None  Treatments: surgery: see above  Discharge Exam: Blood pressure 128/72, pulse 73, temperature 98.1 F (36.7 C), temperature source Oral, resp. rate 15, height 5' 4.5" (1.638 m), weight 186 lb 15.2 oz (84.8 kg), SpO2 90 %. General appearance: alert Resp: clear to auscultation bilaterally Cardio: regular rate and rhythm, S1, S2 normal, no murmur, click, rub or gallop GI: soft, non-tender; bowel sounds normal; no masses,  no organomegaly Extremities: extremities normal, atraumatic, no cyanosis or edema Incision/Wound: scant dried heme on dressing; incisions intact  Disposition: Discharge disposition: 01-Home or Self Care       Discharge Instructions    Activity as tolerated - No restrictions   Complete by:  As directed    Call MD for:  extreme fatigue   Complete by:  As directed    Call MD for:  persistant dizziness or light-headedness   Complete by:  As directed    Call MD for:  persistant nausea and vomiting   Complete by:  As directed    Call MD for:  redness, tenderness, or signs of infection (pain, swelling, redness, odor or green/yellow discharge around incision site)   Complete by:  As directed     Call MD for:  severe uncontrolled pain   Complete by:  As directed    Call MD for:  temperature >100.4   Complete by:  As directed    Diet - low sodium heart healthy   Complete by:  As directed    Diet Carb Modified   Complete by:  As directed    Discharge instructions   Complete by:  As directed    Activity: 1. Be up and out of the bed during the day.  Take a nap if needed.  You may walk up steps but be careful and use the hand rail.  Stair climbing will tire you more than you think, you may need to stop part way and rest.   2. No lifting or straining for 6 weeks.  3. No driving for 1-2 weeks.  Do Not drive if you are taking narcotic pain medicine.  4. Shower daily.  Use soap and water on your incision and pat dry; don't rub.   5. No sexual activity and nothing in the vagina for 4 weeks.  Diet: 1. Low sodium Heart Healthy Diet is recommended.  2. It is safe to use a laxative if you have difficulty moving your bowels.   Wound Care: 1. Keep clean and dry.  Shower daily.  Reasons to call the Doctor:  Fever - Oral temperature greater than 100.4 degrees Fahrenheit Foul-smelling vaginal discharge Difficulty urinating Nausea and vomiting Increased pain at the site of the incision that is unrelieved with pain medicine. Difficulty breathing with or without chest pain New calf pain especially if only on  one side Sudden, continuing increased vaginal bleeding with or without clots.   Follow-up: 1. See Everitt Amber in 4 weeks.  Contacts: For questions or concerns you should contact:  Dr. Precious Haws at [phone]  or at [place]   Discharge wound care:   Complete by:  As directed    Keep clean and dry   Driving Restrictions   Complete by:  As directed    No driving for 1- 2 weeks   Increase activity slowly   Complete by:  As directed    Lifting restrictions   Complete by:  As directed    No lifting > 5 lbs for 6 weeks   May shower / Bathe   Complete by:   As directed    Sexual Activity Restrictions   Complete by:  As directed    No intercourse for 6 - 8 weeks     Allergies as of 02/02/2018      Reactions   Penicillins Anaphylaxis   Has patient had a PCN reaction causing immediate rash, facial/tongue/throat swelling, SOB or lightheadedness with hypotension: Yes Has patient had a PCN reaction causing severe rash involving mucus membranes or skin necrosis: No Has patient had a PCN reaction that required hospitalization: Yes Has patient had a PCN reaction occurring within the last 10 years: No If all of the above answers are "NO", then may proceed with Cephalosporin use.      Medication List    TAKE these medications   ALPRAZolam 0.5 MG tablet Commonly known as:  XANAX Take 0.5 mg by mouth at bedtime.   amLODipine 5 MG tablet Commonly known as:  NORVASC Take 5 mg by mouth every morning.   atorvastatin 20 MG tablet Commonly known as:  LIPITOR Take 20 mg by mouth every evening.   B-12 COMPLIANCE INJECTION 1000 MCG/ML Kit Generic drug:  Cyanocobalamin Inject 1,000 mcg as directed every 30 (thirty) days.   BC FAST PAIN RELIEF PO Take 1 packet by mouth 3 (three) times daily as needed (pain).   diphenhydrAMINE 25 MG tablet Commonly known as:  BENADRYL Take 25 mg by mouth daily as needed for itching (bug bites).   enoxaparin 40 MG/0.4ML injection Commonly known as:  LOVENOX Inject 0.4 mLs (40 mg total) into the skin daily for 26 days.   exenatide 10 MCG/0.04ML Sopn injection Commonly known as:  BYETTA Inject 10 mcg into the skin 2 (two) times daily.   FLUoxetine 20 MG capsule Commonly known as:  PROZAC Take 20 mg by mouth every morning.   hydrochlorothiazide 25 MG tablet Commonly known as:  HYDRODIURIL Take 25 mg by mouth every morning.   HYDROcodone-acetaminophen 5-325 MG tablet Commonly known as:  NORCO/VICODIN Take 1-2 tablets by mouth every 4 (four) hours as needed for moderate pain or severe pain.    levothyroxine 100 MCG tablet Commonly known as:  SYNTHROID, LEVOTHROID Take 100 mcg by mouth daily before breakfast.   losartan 100 MG tablet Commonly known as:  COZAAR Take 100 mg by mouth every morning.   Melatonin 5 MG Tabs Take 5-10 mg by mouth at bedtime.   meloxicam 15 MG tablet Commonly known as:  MOBIC Take 15 mg by mouth once daily   metFORMIN 500 MG 24 hr tablet Commonly known as:  GLUCOPHAGE-XR Take 1,000 mg by mouth 2 (two) times daily.   omeprazole 20 MG tablet Commonly known as:  PRILOSEC OTC Take 20 mg by mouth daily.   potassium chloride 10 MEQ tablet Commonly  known as:  K-DUR,KLOR-CON Take 10 mEq by mouth every evening.   Vitamin D 2000 units tablet Take 2,000 Units by mouth daily.   vitamin E 400 UNIT capsule Take 400 Units by mouth daily.            Discharge Care Instructions  (From admission, onward)        Start     Ordered   02/02/18 0000  Discharge wound care:    Comments:  Keep clean and dry   02/02/18 0955       Signed: Lahoma Crocker 02/02/2018, 10:28 AM

## 2018-02-02 NOTE — Discharge Instructions (Signed)
Bilateral Salpingo-Oophorectomy, Care After °This sheet gives you information about how to care for yourself after your procedure. Your health care provider may also give you more specific instructions. If you have problems or questions, contact your health care provider. °What can I expect after the procedure? °After the procedure, it is common to have: °· Abdominal pain. °· Some occasional vaginal bleeding (spotting). °· Tiredness. °· Symptoms of menopause, such as hot flashes, night sweats, or mood swings. ° °Follow these instructions at home: °Incision care °· Keep your incision area and your bandage (dressing) clean and dry. °· Follow instructions from your health care provider about how to take care of your incision. Make sure you: °? Wash your hands with soap and water before you change your dressing. If soap and water are not available, use hand sanitizer. °? Change your dressing as told by your health care provider. °? Leave stitches (sutures), staples, skin glue, or adhesive strips in place. These skin closures may need to stay in place for 2 weeks or longer. If adhesive strip edges start to loosen and curl up, you may trim the loose edges. Do not remove adhesive strips completely unless your health care provider tells you to do that. °· Check your incision area every day for signs of infection. Check for: °? Redness, swelling, or pain. °? Fluid or blood. °? Warmth. °? Pus or a bad smell. °Activity °· Do not drive or use heavy machinery while taking prescription pain medicine. °· Do not drive for 24 hours if you received a medicine to help you relax (sedative) during your procedure. °· Take frequent, short walks throughout the day. Rest when you get tired. Ask your health care provider what activities are safe for you. °· Avoid activity that requires great effort. Also, avoid heavy lifting. Do not lift anything that is heavier than 10 lbs. (4.5 kg), or the limit that your health care provider tells you,  until he or she says that it is safe to do so. °· Do not douche, use tampons, or have sex until your health care provider approves. °General instructions °· To prevent or treat constipation while you are taking prescription pain medicine, your health care provider may recommend that you: °? Drink enough fluid to keep your urine clear or pale yellow. °? Take over-the-counter or prescription medicines. °? Eat foods that are high in fiber, such as fresh fruits and vegetables, whole grains, and beans. °? Limit foods that are high in fat and processed sugars, such as fried and sweet foods. °· Take over-the-counter and prescription medicines only as told by your health care provider. °· Do not take baths, swim, or use a hot tub until your health care provider approves. Ask your health care provider if you can take showers. You may only be allowed to take sponge baths for bathing. °· Wear compression stockings as told by your health care provider. These stockings help to prevent blood clots and reduce swelling in your legs. °· Keep all follow-up visits as told by your health care provider. This is important. °Contact a health care provider if: °· You have pain when you urinate. °· You have pus or a bad smelling discharge coming from your vagina. °· You have redness, swelling, or pain around your incision. °· You have fluid or blood coming from your incision. °· Your incision feels warm to the touch. °· You have pus or a bad smell coming from your incision. °· You have a fever. °· Your incision   starts to break open. °· You have pain in the abdomen, and it gets worse or does not get better when you take medicine. °· You develop a rash. °· You develop nausea and vomiting. °· You feel lightheaded. °Get help right away if: °· You develop pain in your chest or leg. °· You become short of breath. °· You faint. °· You have increased bleeding from your vagina. °Summary °· After the procedure, it is common to have pain, bleeding in  the vagina, and symptoms of menopause. °· Follow instructions from your health care provider about how to take care of your incision. °· Follow instructions from your health care provider about activities and restrictions. °· Check your incision every day for signs of infection and report any symptoms to your health care provider. °This information is not intended to replace advice given to you by your health care provider. Make sure you discuss any questions you have with your health care provider. °Document Released: 10/02/2005 Document Revised: 11/06/2016 Document Reviewed: 11/06/2016 °Elsevier Interactive Patient Education © 2018 [employer] ° °

## 2018-02-04 ENCOUNTER — Encounter: Payer: Self-pay | Admitting: Gynecologic Oncology

## 2018-02-04 ENCOUNTER — Other Ambulatory Visit: Payer: Self-pay | Admitting: Gynecologic Oncology

## 2018-02-04 ENCOUNTER — Inpatient Hospital Stay (HOSPITAL_BASED_OUTPATIENT_CLINIC_OR_DEPARTMENT_OTHER): Payer: Medicare HMO | Admitting: Gynecologic Oncology

## 2018-02-04 ENCOUNTER — Inpatient Hospital Stay: Payer: Medicare HMO

## 2018-02-04 ENCOUNTER — Ambulatory Visit (HOSPITAL_COMMUNITY)
Admission: RE | Admit: 2018-02-04 | Discharge: 2018-02-04 | Disposition: A | Discharge status: Home / Self Care | Payer: Medicare HMO | Source: Ambulatory Visit | Attending: Gynecologic Oncology | Admitting: Gynecologic Oncology

## 2018-02-04 ENCOUNTER — Telehealth: Payer: Self-pay | Admitting: *Deleted

## 2018-02-04 VITALS — BP 122/56 | HR 76 | Temp 98.2°F | Ht 64.5 in | Wt 187.9 lb

## 2018-02-04 DIAGNOSIS — C562 Malignant neoplasm of left ovary: Secondary | ICD-10-CM

## 2018-02-04 DIAGNOSIS — R32 Unspecified urinary incontinence: Secondary | ICD-10-CM

## 2018-02-04 DIAGNOSIS — R198 Other specified symptoms and signs involving the digestive system and abdomen: Secondary | ICD-10-CM

## 2018-02-04 DIAGNOSIS — R933 Abnormal findings on diagnostic imaging of other parts of digestive tract: Secondary | ICD-10-CM | POA: Insufficient documentation

## 2018-02-04 LAB — URINALYSIS, COMPLETE (UACMP) WITH MICROSCOPIC
Bacteria, UA: NONE SEEN
Bilirubin Urine: NEGATIVE
Glucose, UA: NEGATIVE mg/dL
Hgb urine dipstick: NEGATIVE
Ketones, ur: 5 mg/dL — AB
NITRITE: NEGATIVE
PH: 5 (ref 5.0–8.0)
Protein, ur: NEGATIVE mg/dL
SPECIFIC GRAVITY, URINE: 1.025 (ref 1.005–1.030)

## 2018-02-04 LAB — CBC WITH DIFFERENTIAL (CANCER CENTER ONLY)
BASOS ABS: 0 10*3/uL (ref 0.0–0.1)
BASOS PCT: 0 %
EOS ABS: 0.3 10*3/uL (ref 0.0–0.5)
EOS PCT: 4 %
HEMATOCRIT: 36.7 % (ref 34.8–46.6)
Hemoglobin: 11.9 g/dL (ref 11.6–15.9)
Lymphocytes Relative: 23 %
Lymphs Abs: 1.8 10*3/uL (ref 0.9–3.3)
MCH: 29.6 pg (ref 25.1–34.0)
MCHC: 32.4 g/dL (ref 31.5–36.0)
MCV: 91.3 fL (ref 79.5–101.0)
MONO ABS: 0.5 10*3/uL (ref 0.1–0.9)
MONOS PCT: 6 %
Neutro Abs: 5.3 10*3/uL (ref 1.5–6.5)
Neutrophils Relative %: 67 %
PLATELETS: 242 10*3/uL (ref 145–400)
RBC: 4.02 MIL/uL (ref 3.70–5.45)
RDW: 14.9 % — AB (ref 11.2–14.5)
WBC Count: 8 10*3/uL (ref 3.9–10.3)

## 2018-02-04 LAB — CMP (CANCER CENTER ONLY)
ALT: 24 U/L (ref 0–55)
AST: 14 U/L (ref 5–34)
Albumin: 3.1 g/dL — ABNORMAL LOW (ref 3.5–5.0)
Alkaline Phosphatase: 69 U/L (ref 40–150)
Anion gap: 11 (ref 3–11)
BUN: 11 mg/dL (ref 7–26)
CO2: 23 mmol/L (ref 22–29)
Calcium: 9.7 mg/dL (ref 8.4–10.4)
Chloride: 105 mmol/L (ref 98–109)
Creatinine: 0.77 mg/dL (ref 0.60–1.10)
GFR, Est AFR Am: >60 mL/min (ref >60)
GFR, Estimated: >60 mL/min (ref >60)
Glucose, Bld: 129 mg/dL (ref 70–140)
Potassium: 4.1 mmol/L (ref 3.5–5.1)
Sodium: 139 mmol/L (ref 136–145)
Total Bilirubin: 0.9 mg/dL (ref 0.2–1.2)
Total Protein: 6 g/dL — ABNORMAL LOW (ref 6.4–8.3)

## 2018-02-04 LAB — BASIC METABOLIC PANEL
ANION GAP: 11 (ref 3–11)
BUN: 11 mg/dL (ref 7–26)
CALCIUM: 9.6 mg/dL (ref 8.4–10.4)
CO2: 24 mmol/L (ref 22–29)
CREATININE: 0.78 mg/dL (ref 0.60–1.10)
Chloride: 105 mmol/L (ref 98–109)
Glucose, Bld: 127 mg/dL (ref 70–140)
Potassium: 4.1 mmol/L (ref 3.5–5.1)
Sodium: 140 mmol/L (ref 136–145)

## 2018-02-04 NOTE — Progress Notes (Signed)
BRCA testing on [phone] ordered with Suanne Marker at [facility] path.

## 2018-02-04 NOTE — Patient Instructions (Signed)
Plan to begin taking Miralax twice daily along with stool softeners twice daily.  If you develop loose stools, you can stop the medications for your bowels or decrease the frequency to once daily.  Please call if you develop a fever, chills, nausea, vomiting, increased abdominal pain, or any new symptoms.

## 2018-02-04 NOTE — Telephone Encounter (Signed)
Patient states " I have not had a bowel movement since my surgery last week, I have tried 3 senokots each day, and still not result, I am not having any gas movement either". " My stomach is very tight." "I also can not control my bladder, when I stand up it flows right out and I can not make it to the bathroom. Patient states "I have not fever or chills".  I notified Joylene John, NP of the symptoms, Joylene John, NP will see the patient today at 2pm for evaluation.

## 2018-02-05 ENCOUNTER — Encounter (HOSPITAL_COMMUNITY): Payer: Self-pay

## 2018-02-05 ENCOUNTER — Inpatient Hospital Stay (HOSPITAL_COMMUNITY)
Admission: AD | Admit: 2018-02-05 | Discharge: 2018-02-08 | DRG: 392 | Disposition: A | Discharge status: Home / Self Care | Payer: Medicare HMO | Source: Ambulatory Visit | Attending: Obstetrics | Admitting: Obstetrics

## 2018-02-05 ENCOUNTER — Encounter: Payer: Self-pay | Admitting: Gynecologic Oncology

## 2018-02-05 ENCOUNTER — Other Ambulatory Visit: Payer: Self-pay

## 2018-02-05 ENCOUNTER — Telehealth: Payer: Self-pay | Admitting: *Deleted

## 2018-02-05 ENCOUNTER — Observation Stay (HOSPITAL_COMMUNITY): Payer: Medicare HMO

## 2018-02-05 DIAGNOSIS — K219 Gastro-esophageal reflux disease without esophagitis: Secondary | ICD-10-CM | POA: Diagnosis present

## 2018-02-05 DIAGNOSIS — R7989 Other specified abnormal findings of blood chemistry: Secondary | ICD-10-CM

## 2018-02-05 DIAGNOSIS — M1990 Unspecified osteoarthritis, unspecified site: Secondary | ICD-10-CM | POA: Diagnosis present

## 2018-02-05 DIAGNOSIS — R945 Abnormal results of liver function studies: Secondary | ICD-10-CM | POA: Diagnosis present

## 2018-02-05 DIAGNOSIS — R112 Nausea with vomiting, unspecified: Principal | ICD-10-CM | POA: Diagnosis present

## 2018-02-05 DIAGNOSIS — E039 Hypothyroidism, unspecified: Secondary | ICD-10-CM | POA: Diagnosis present

## 2018-02-05 DIAGNOSIS — K567 Ileus, unspecified: Secondary | ICD-10-CM | POA: Diagnosis present

## 2018-02-05 DIAGNOSIS — K5909 Other constipation: Secondary | ICD-10-CM

## 2018-02-05 DIAGNOSIS — R109 Unspecified abdominal pain: Secondary | ICD-10-CM

## 2018-02-05 DIAGNOSIS — E119 Type 2 diabetes mellitus without complications: Secondary | ICD-10-CM | POA: Diagnosis present

## 2018-02-05 DIAGNOSIS — C562 Malignant neoplasm of left ovary: Secondary | ICD-10-CM | POA: Diagnosis present

## 2018-02-05 DIAGNOSIS — I10 Essential (primary) hypertension: Secondary | ICD-10-CM | POA: Diagnosis present

## 2018-02-05 DIAGNOSIS — Z880 Allergy status to penicillin: Secondary | ICD-10-CM

## 2018-02-05 DIAGNOSIS — F5101 Primary insomnia: Secondary | ICD-10-CM

## 2018-02-05 DIAGNOSIS — R911 Solitary pulmonary nodule: Secondary | ICD-10-CM | POA: Diagnosis present

## 2018-02-05 DIAGNOSIS — N289 Disorder of kidney and ureter, unspecified: Secondary | ICD-10-CM | POA: Diagnosis present

## 2018-02-05 DIAGNOSIS — E1151 Type 2 diabetes mellitus with diabetic peripheral angiopathy without gangrene: Secondary | ICD-10-CM

## 2018-02-05 DIAGNOSIS — R32 Unspecified urinary incontinence: Secondary | ICD-10-CM | POA: Diagnosis present

## 2018-02-05 DIAGNOSIS — Z96652 Presence of left artificial knee joint: Secondary | ICD-10-CM | POA: Diagnosis present

## 2018-02-05 DIAGNOSIS — F339 Major depressive disorder, recurrent, unspecified: Secondary | ICD-10-CM

## 2018-02-05 DIAGNOSIS — Z79899 Other long term (current) drug therapy: Secondary | ICD-10-CM

## 2018-02-05 DIAGNOSIS — Z9889 Other specified postprocedural states: Secondary | ICD-10-CM | POA: Diagnosis present

## 2018-02-05 LAB — COMPREHENSIVE METABOLIC PANEL
ALBUMIN: 3.3 g/dL — AB (ref 3.5–5.0)
ALT: 48 U/L (ref 14–54)
ANION GAP: 12 (ref 5–15)
AST: 46 U/L — ABNORMAL HIGH (ref 15–41)
Alkaline Phosphatase: 131 U/L — ABNORMAL HIGH (ref 38–126)
BUN: 12 mg/dL (ref 6–20)
CHLORIDE: 101 mmol/L (ref 101–111)
CO2: 25 mmol/L (ref 22–32)
Calcium: 9.4 mg/dL (ref 8.9–10.3)
Creatinine, Ser: 0.57 mg/dL (ref 0.44–1.00)
GFR calc Af Amer: >60 mL/min (ref >60)
GFR calc non Af Amer: >60 mL/min (ref >60)
GLUCOSE: 125 mg/dL — AB (ref 65–99)
Potassium: 4.4 mmol/L (ref 3.5–5.1)
SODIUM: 138 mmol/L (ref 135–145)
Total Bilirubin: 1.3 mg/dL — ABNORMAL HIGH (ref 0.3–1.2)
Total Protein: 6.2 g/dL — ABNORMAL LOW (ref 6.5–8.1)

## 2018-02-05 LAB — GLUCOSE, CAPILLARY
GLUCOSE-CAPILLARY: 107 mg/dL — AB (ref 65–99)
GLUCOSE-CAPILLARY: 146 mg/dL — AB (ref 65–99)

## 2018-02-05 LAB — CBC
HCT: 37.2 % (ref 36.0–46.0)
HEMOGLOBIN: 12.2 g/dL (ref 12.0–15.0)
MCH: 29.5 pg (ref 26.0–34.0)
MCHC: 32.8 g/dL (ref 30.0–36.0)
MCV: 90.1 fL (ref 78.0–100.0)
PLATELETS: 276 10*3/uL (ref 150–400)
RBC: 4.13 MIL/uL (ref 3.87–5.11)
RDW: 14.9 % (ref 11.5–15.5)
WBC: 10 10*3/uL (ref 4.0–10.5)

## 2018-02-05 LAB — URINE CULTURE: Culture: NO GROWTH

## 2018-02-05 LAB — MAGNESIUM: Magnesium: 1.5 mg/dL — ABNORMAL LOW (ref 1.7–2.4)

## 2018-02-05 MED ORDER — ACETAMINOPHEN 325 MG PO TABS
650.0000 mg | ORAL_TABLET | ORAL | Status: DC | PRN
  Administered 2018-02-06 (×2): 650 mg via ORAL
  Filled 2018-02-05 (×2): qty 2

## 2018-02-05 MED ORDER — LOSARTAN POTASSIUM 50 MG PO TABS
100.0000 mg | ORAL_TABLET | Freq: Every morning | ORAL | Status: DC
  Administered 2018-02-06 – 2018-02-08 (×3): 100 mg via ORAL
  Filled 2018-02-05 (×3): qty 2

## 2018-02-05 MED ORDER — INSULIN ASPART 100 UNIT/ML ~~LOC~~ SOLN
0.0000 [IU] | SUBCUTANEOUS | Status: DC
  Administered 2018-02-05: 2 [IU] via SUBCUTANEOUS
  Administered 2018-02-06: 3 [IU] via SUBCUTANEOUS
  Administered 2018-02-06 – 2018-02-07 (×7): 2 [IU] via SUBCUTANEOUS

## 2018-02-05 MED ORDER — ALPRAZOLAM 0.5 MG PO TABS
0.5000 mg | ORAL_TABLET | Freq: Every day | ORAL | Status: DC
  Administered 2018-02-05 – 2018-02-07 (×3): 0.5 mg via ORAL
  Filled 2018-02-05 (×3): qty 1

## 2018-02-05 MED ORDER — IOHEXOL 300 MG/ML  SOLN
100.0000 mL | Freq: Once | INTRAMUSCULAR | Status: AC | PRN
  Administered 2018-02-05: 100 mL via INTRAVENOUS

## 2018-02-05 MED ORDER — MORPHINE SULFATE (PF) 2 MG/ML IV SOLN
2.0000 mg | INTRAVENOUS | Status: DC | PRN
  Administered 2018-02-05 – 2018-02-06 (×5): 2 mg via INTRAVENOUS
  Filled 2018-02-05 (×6): qty 1

## 2018-02-05 MED ORDER — IOPAMIDOL (ISOVUE-300) INJECTION 61%: 15.0000 mL | Freq: Once | INTRAVENOUS | Status: AC | PRN

## 2018-02-05 MED ORDER — HYDROMORPHONE HCL 1 MG/ML IJ SOLN
0.5000 mg | INTRAMUSCULAR | Status: DC | PRN
  Administered 2018-02-05 (×2): 0.5 mg via INTRAVENOUS
  Filled 2018-02-05 (×2): qty 0.5

## 2018-02-05 MED ORDER — ENOXAPARIN SODIUM 40 MG/0.4ML ~~LOC~~ SOLN
40.0000 mg | SUBCUTANEOUS | Status: DC
  Administered 2018-02-06 – 2018-02-08 (×3): 40 mg via SUBCUTANEOUS
  Filled 2018-02-05 (×3): qty 0.4

## 2018-02-05 MED ORDER — FLUOXETINE HCL 20 MG PO CAPS
20.0000 mg | ORAL_CAPSULE | Freq: Every morning | ORAL | Status: DC
  Administered 2018-02-06 – 2018-02-08 (×3): 20 mg via ORAL
  Filled 2018-02-05 (×3): qty 1

## 2018-02-05 MED ORDER — ALUM & MAG HYDROXIDE-SIMETH 200-200-20 MG/5ML PO SUSP
30.0000 mL | ORAL | Status: DC | PRN
  Administered 2018-02-05: 30 mL via ORAL
  Filled 2018-02-05: qty 30

## 2018-02-05 MED ORDER — ONDANSETRON HCL 4 MG/2ML IJ SOLN
4.0000 mg | Freq: Four times a day (QID) | INTRAMUSCULAR | Status: DC | PRN
  Administered 2018-02-05 (×2): 4 mg via INTRAVENOUS
  Filled 2018-02-05 (×2): qty 2

## 2018-02-05 MED ORDER — HYDROMORPHONE HCL 1 MG/ML IJ SOLN: 0.2000 mg | INTRAMUSCULAR | Status: DC | PRN

## 2018-02-05 MED ORDER — SIMETHICONE 80 MG PO CHEW
80.0000 mg | CHEWABLE_TABLET | Freq: Four times a day (QID) | ORAL | Status: DC | PRN
  Administered 2018-02-05 – 2018-02-07 (×3): 80 mg via ORAL
  Filled 2018-02-05 (×3): qty 1

## 2018-02-05 MED ORDER — AMLODIPINE BESYLATE 5 MG PO TABS
5.0000 mg | ORAL_TABLET | Freq: Every morning | ORAL | Status: DC
  Administered 2018-02-06 – 2018-02-07 (×2): 5 mg via ORAL
  Filled 2018-02-05 (×3): qty 1

## 2018-02-05 MED ORDER — DEXTROSE-NACL 5-0.45 % IV SOLN
INTRAVENOUS | Status: DC
  Administered 2018-02-05 – 2018-02-06 (×4): via INTRAVENOUS

## 2018-02-05 MED ORDER — LEVOTHYROXINE SODIUM 100 MCG PO TABS
100.0000 ug | ORAL_TABLET | Freq: Every day | ORAL | Status: DC
  Administered 2018-02-06 – 2018-02-08 (×3): 100 ug via ORAL
  Filled 2018-02-05 (×3): qty 1

## 2018-02-05 MED ORDER — ONDANSETRON HCL 4 MG PO TABS
4.0000 mg | ORAL_TABLET | Freq: Four times a day (QID) | ORAL | Status: DC | PRN
  Administered 2018-02-06: 4 mg via ORAL
  Filled 2018-02-05: qty 1

## 2018-02-05 MED ORDER — MAGNESIUM SULFATE 2 GM/50ML IV SOLN
2.0000 g | Freq: Once | INTRAVENOUS | Status: AC
  Administered 2018-02-05: 2 g via INTRAVENOUS
  Filled 2018-02-05: qty 50

## 2018-02-05 MED ORDER — IOPAMIDOL (ISOVUE-300) INJECTION 61%
INTRAVENOUS | Status: AC
  Administered 2018-02-05: 15 mL
  Filled 2018-02-05: qty 30

## 2018-02-05 NOTE — Progress Notes (Signed)
Patient received another dose of dilaudid. Administered well diluted high in line, flushed slowly. Patient reported feeling the tightness again in abdomen. Relieved with repositioning and removal of undergarments. On call notified, medication changed to morphine.

## 2018-02-05 NOTE — Progress Notes (Signed)
GYN Oncology  Subjective: Patient reports severe deep chest pain after having the dilaudid and zofran pushed through her IV.  After one minute, she reports improvement in chest pain symptoms but states the pain has moved to her upper abdomen and is severe like having a band around her waist.  Daughter at the bedside.  Patient reports improvement in the discomfort after 4-5 minutes in her abdomen.          Objective: Vital signs in last 24 hours: Temp:  [98.2 F (36.8 C)] 98.2 F (36.8 C) (04/22 1459) Pulse Rate:  [76-79] 76 (04/23 1334) Resp:  [16-20] 16 (04/23 1334) BP: (117-165)/(56-84) 117/67 (04/23 1334) SpO2:  [92 %-98 %] 92 % (04/23 1334) Weight:  [187 lb 14.4 oz (85.2 kg)] 187 lb 14.4 oz (85.2 kg) (04/23 1314) Last BM Date: 01/29/18  Intake/Output from previous day: No intake/output data recorded.  Physical Examination: General: alert, cooperative, moderate distress and related to pain experienced after IV pain medication administration Resp: clear to auscultation bilaterally Cardio: regular rate and rhythm, S1, S2 normal, no murmur, click, rub or gallop GI: abdomen soft, slightly distended, tympanic, active bowel sounds, tender in upper quadrant, incisions healing without evidence of infection Extremities: extremities normal, atraumatic, no cyanosis or edema  Labs: WBC/Hgb/Hct/Plts:  10.0/12.2/37.2/276 (04/23 1218) BUN/Cr/glu/ALT/AST/amyl/lip:  12/0.57/--/48/46/--/-- (04/23 1218)  Assessment: 77 y.o. currently admitted for post-operative nausea/vomiting, abdominal pain, significant urinary leakage. Pain:  Pain worsened but eventually improved after IV dilaudid.  Heme: Hgb 12.2 and Hct 37.2. Stable.  CV: BP and HR stable.    GI:  Tolerating po: NPO for CT scan.  Antiemetics ordered PRN. Abd 2 view from 02/04/18: No bowel dilatation. There are, however, air-fluid levels in the right abdomen. Suspect a degree of ileus or enteritis. Bowel obstruction not felt to be likely.  Note that there is stool and air in the rectum. There is fairly diffuse stool throughout the colon. Lung bases clear.    GU: Urine culture pending but initial analysis without significant findings. Creatinine 0.57 today.    FEN: Stable yesterday and today.  Endo: Type 2 Diabetes under good control.  Prophylaxis: Lovenox ordered and SCDs.  Plan: EKG performed and reviewed.  No change compared to pre-op Abd pain is subsiding after IV dilaudid administration. Patient stable. Plan for CT scan of the chest, abdomen and pelvis Continue plan of care per Dr. Gerarda Fraction   LOS: 0 days    Dorothyann Gibbs 02/05/2018, 2:41 PM

## 2018-02-05 NOTE — H&P (Addendum)
Follow Up Note: Gyn-Onc  [name] 77 y.o. female  CC:  Abdominal pain, nausea/emesis post-op, urinary incontinence  HPI: [name] is a 77 year old female initially referred by Dr. Molli Posey for a complex pelvic mass.  She noted abnormalities in her gait with lead to an MRI of the right hip ordered by Dr. Gladstone Lighter.  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  She was seen by her gynecologist, Dr. Matthew Saras and a pelvic ultrasound was obtained on January 02, 2018. It was notable for the left adnexa measuring 8.1 x 6.6 x 5.9 cm complex partially cystic with solid components.  Blood flow was noted within the solid portion of the mass. Ca 125 returned a value of 11.1.    Family history is notable for a niece with a diagnosis of breast cancer at age 38 and her sister treated for bladder cancer at the age of 52.  Last mammogram was in March 2019 within normal limits and colonoscopy in 2017 was notable for atypical polyps which were removed.   On 01/31/18, she underwent a robotic BSO and washings, exploratory laparotomy, staging including infragastric omentectomy, bilateral pelvic and paraaortic lymphadenectomy, and pertioneal biopsies with Dr. Precious Haws.  Her post-operative course was uneventful. Final pathology revealed: 1. 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 - TUMOR INVOLVES OVARY SURFACE AND LEFT FALLOPIAN TUBE. - SEE ONCOLOGY TABLE. 2. Mesentery, small bowel mesentery biopsy #1 - BENIGN FIBROADIPOSE TISSUE. 3. Ovary and fallopian tube, right - BENIGN OVARY WITH INCLUSION CYSTS. - BENIGN FALLOPIAN TUBE WITH PARATUBAL CYSTS AND ADENOFIBROMA. 4. Omentum, resection for tumor - BENIGN ADIPOSE TISSUE. 5. Peritoneum, biopsy, left diaphragmatic - BENIGN PERITONEAL TYPE TISSUE. 6.  Peritoneum, biopsy, right diaphragmatic - BENIGN PERITONEAL TYPE TISSUE. 7. Mesentery, small bowel mesenteric biopsy #2 - BENIGN FIBROADIPOSE TISSUE. 8. Lymph nodes, regional resection, right pelvic - FIVE OF FIVE LYMPH NODES NEGATIVE FOR CARCINOMA (0/5). 9. Lymph nodes, regional resection, left pelvic - FOUR OF FOUR LYMPH NODES NEGATIVE FOR CARCINOMA (0/4). 10. Peritoneum, biopsy, left gutter - BENIGN PERITONEAL TYPE TISSUE. 11. Peritoneum, biopsy, bladder - BENIGN PERITONEAL TYPE TISSUE WITH ACUTE INFLAMMATION AND CALCIFICATIONS. 1 of 4 Amended copy Amended FINAL for [name], [name] [phone]) Diagnosis(continued) 12. Peritoneum, biopsy, cul-de-sac - BENIGN PERITONEAL TYPE TISSUE WITH ACUTE INFLAMMATION AND CALCIFICATIONS. 13. Soft tissue, biopsy, right gutter - BENIGN FIBROADIPOSE TISSUE. 14. Lymph node, biopsy, right para-aortic - TWO OF TWO LYMPH NODES NEGATIVE FOR CARCINOMA (0/2). 15. Lymph node, biopsy, left para-aortic - ONE OF ONE LYMPH NODES NEGATIVE FOR CARCINOMA (0/1). PERITONEAL WASHING (SPECIMEN 1 OF 1 COLLECTED 01/31/18): MALIGNANT CELLS PRESENT CONSISTENT WITH METASTATIC CARCINOMA.  Interval History:  She called the office this am with complaints of emesis, continued urinary leakage, and abdominal pain.  She was seen in the office yesterday and reported improvement in her symptoms after having a BM after administration of a soap suds enema.  She states she was unable to sleep last pm due to being so uncomfortable.  No flatus passed overnight.  The urinary leakage continued through the night and she states she feels it just pours out and she is not sure if it is coming from her bladder.  Abdominal pain persists and is described as soreness in the upper abdomen and across the lower abdomen.  She got up  this am with nausea and states she tried to drink a cup of coffee then had several episodes of emesis.  No bowel movements since enema in the office.    Review of  Systems: Constitutional: Feels poorly. Moderate abdominal pain with nausea.       Cardiovascular: No chest pain, shortness of breath, or edema.  Pulmonary: No cough or wheeze.  Gastrointestinal: Positive for nausea and emesis this am. No diarrhea. No bright red blood per rectum. Genitourinary:  Moderate urinary leakage. No vaginal bleeding or discharge.  Musculoskeletal: No myalgia or joint pain. Neurologic: No weakness, numbness, or change in gait.  Psychology: Insomnia related to feeling uncomfortable. No depression, anxiety.  Current Meds:  No facility-administered encounter medications on file as of 02/04/2018.    Outpatient Encounter Medications as of 02/04/2018  Medication Sig  . ALPRAZolam (XANAX) 0.5 MG tablet Take 0.5 mg by mouth at bedtime.   Marland Kitchen amLODipine (NORVASC) 5 MG tablet Take 5 mg by mouth every morning.  . Aspirin-Caffeine (BC FAST PAIN RELIEF PO) Take 1 packet by mouth 3 (three) times daily as needed (pain).  Marland Kitchen atorvastatin (LIPITOR) 20 MG tablet Take 20 mg by mouth every evening.   . Cholecalciferol (VITAMIN D) 2000 units tablet Take 2,000 Units by mouth daily.  . Cyanocobalamin (B-12 COMPLIANCE INJECTION) 1000 MCG/ML KIT Inject 1,000 mcg as directed every 30 (thirty) days.  Marland Kitchen enoxaparin (LOVENOX) 40 MG/0.4ML injection Inject 0.4 mLs (40 mg total) into the skin daily for 26 days.  Marland Kitchen exenatide (BYETTA) 10 MCG/0.04ML SOPN injection Inject 10 mcg into the skin 2 (two) times daily.   Marland Kitchen FLUoxetine (PROZAC) 20 MG capsule Take 20 mg by mouth every morning.  . hydrochlorothiazide (HYDRODIURIL) 25 MG tablet Take 25 mg by mouth every morning.  Marland Kitchen HYDROcodone-acetaminophen (NORCO/VICODIN) 5-325 MG tablet Take 1-2 tablets by mouth every 4 (four) hours as needed for moderate pain or severe pain.  Marland Kitchen levothyroxine (SYNTHROID, LEVOTHROID) 100 MCG tablet Take 100 mcg by mouth daily before breakfast.  . losartan (COZAAR) 100 MG tablet Take 100 mg by mouth every morning.  . Melatonin 5 MG  TABS Take 5-10 mg by mouth at bedtime.  . metFORMIN (GLUCOPHAGE-XR) 500 MG 24 hr tablet Take 1,000 mg by mouth 2 (two) times daily.  Marland Kitchen omeprazole (PRILOSEC OTC) 20 MG tablet Take 20 mg by mouth daily.  . potassium chloride (K-DUR,KLOR-CON) 10 MEQ tablet Take 10 mEq by mouth every evening.   . vitamin E 400 UNIT capsule Take 400 Units by mouth daily.  . diphenhydrAMINE (BENADRYL) 25 MG tablet Take 25 mg by mouth daily as needed for itching (bug bites).  . meloxicam (MOBIC) 15 MG tablet Take 15 mg by mouth once daily    Allergy:  Allergies  Allergen Reactions  . Penicillins Anaphylaxis    Has patient had a PCN reaction causing immediate rash, facial/tongue/throat swelling, SOB or lightheadedness with hypotension: Yes Has patient had a PCN reaction causing severe rash involving mucus membranes or skin necrosis: No Has patient had a PCN reaction that required hospitalization: Yes Has patient had a PCN reaction occurring within the last 10 years: No If all of the above answers are "NO", then may proceed with Cephalosporin use.     Social Hx:   Social History   Socioeconomic History  . Marital status: Widowed    Spouse name: Not on file  . Number of children: Not on file  . Years of education: Not on file  . Highest education  level: Not on file  Occupational History  . Not on file  Social Needs  . Financial resource strain: Not on file  . Food insecurity:    Worry: Not on file    Inability: Not on file  . Transportation needs:    Medical: Not on file    Non-medical: Not on file  Tobacco Use  . Smoking status: Never Smoker  . Smokeless tobacco: Never Used  Substance and Sexual Activity  . Alcohol use: No  . Drug use: No  . Sexual activity: Not on file  Lifestyle  . Physical activity:    Days per week: Not on file    Minutes per session: Not on file  . Stress: Not on file  Relationships  . Social connections:    Talks on phone: Not on file    Gets together: Not on file     Attends religious service: Not on file    Active member of club or organization: Not on file    Attends meetings of clubs or organizations: Not on file    Relationship status: Not on file  . Intimate partner violence:    Fear of current or ex partner: Not on file    Emotionally abused: Not on file    Physically abused: Not on file    Forced sexual activity: Not on file  Other Topics Concern  . Not on file  Social History Narrative  . Not on file    Past Surgical Hx:  Past Surgical History:  Procedure Laterality Date  . BLADDER SUSPENSION  2002  . BREAST SURGERY     several benign cysts removed; surgeries from [employer]   . CHOLECYSTECTOMY  1988  . EYE SURGERY  2010 amd 2012   cataracts removed bilateral   . KNEE ARTHROSCOPY Left    l knee x2  . PARATHYROIDECTOMY  2010  . ROBOTIC ASSISTED SALPINGO OOPHERECTOMY Bilateral 01/31/2018   Procedure: XI ROBOTIC ASSISTED BILATERAL SALPINGO OOPHORECTOMY, STAGING, LAPAROTOMY, PELVIC AND PARA AORTIC LYMPH NODE DISSECTION, OMENTECTOMY;  Surgeon: Isabel Caprice, MD;  Location: [facility];  Service: Gynecology;  Laterality: Bilateral;  . ROTATOR CUFF REPAIR  2005   right   . 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 Arthritis   . Diabetes mellitus without complication ([place])    type 2   . Difficulty sleeping   . GERD (gastroesophageal reflux disease)   . Hypertension   . Hypothyroidism   . Pneumonia    its been 4 years   . PONV (postoperative nausea and vomiting)   . Vitamin B 12 deficiency     Family Hx:  Family History  Problem Relation Age of Onset  . Lymphoma Mother   . Bladder Cancer Sister   . Prostate cancer Brother   . Leukemia Brother   . Breast cancer Other 30       sister's daughter     Vitals:  Blood pressure 117/67, pulse 76, resp. rate 16, height 5' 4.49" (1.638 m), weight 187 lb 14.4 oz (85.2 kg), SpO2 92 %.   CBC    Component Value Date/Time   WBC 10.0 02/05/2018 1218   RBC 4.13 02/05/2018 1218   HGB 12.2 02/05/2018 1218   HGB 11.9 02/04/2018  1434   HCT 37.2 02/05/2018 1218   PLT 276 02/05/2018 1218   PLT 242 02/04/2018 1434   MCV 90.1 02/05/2018 1218   MCH 29.5 02/05/2018 1218   MCHC 32.8 02/05/2018 1218   RDW 14.9 02/05/2018 1218   LYMPHSABS 1.8 02/04/2018 1434   MONOABS 0.5 02/04/2018 1434   EOSABS 0.3 02/04/2018 1434   BASOSABS 0.0 02/04/2018 1434   CMP Latest Ref Rng & Units 02/05/2018 02/04/2018 02/04/2018  Glucose 65 - 99 mg/dL 125(H) 129 127  BUN 6 - 20 mg/dL _0 Creatinine 0.44 - 1.00 mg/dL 0.57 0.77 0.78  Sodium 135 - 145 mmol/L 138 139 140  Potassium 3.5 - 5.1 mmol/L 4.4 4.1 4.1  Chloride 101 - 111 mmol/L 101 105 105  CO2 22 - 32 mmol/L _1 Calcium 8.9 - 10.3 mg/dL 9.4 9.7 9.6  Total Protein 6.5 - 8.1 g/dL 6.2(L) 6.0(L) -  Total Bilirubin 0.3 - 1.2 mg/dL 1.3(H) 0.9 -  Alkaline Phos 38 - 126 U/L 131(H) 69 -  AST 15 - 41 U/L 46(H) 14 -  ALT 14 - 54 U/L 48 24 -   Abd 2 view from 02/04/18: No bowel dilatation. There are, however, air-fluid levels in the right abdomen. Suspect a degree of ileus or enteritis. Bowel obstruction not felt to be likely. Note that there is stool and air in the rectum. There is fairly diffuse stool throughout the colon. Lung bases clear.  Physical Exam:  General: Well developed, well nourished female in no acute distress but appears uncomfortable. Alert and oriented x 3.  Cardiovascular: Regular rate and rhythm. S1 and S2 normal.  Lungs: Clear to auscultation bilaterally. No wheezes/crackles/rhonchi noted.  Skin: No rashes or lesions present. Back: No CVA tenderness.  Abdomen: Abdomen soft, non-tender and obese. Active bowel sounds in all quadrants. Slightly tympanic. Midline incision with steri strips  with resolving ecchymosis with no drainage or erythema.  Lap sites with dermabond without signs of infection.   Extremities: No bilateral cyanosis, edema, or clubbing.   Assessment/Plan: 77 year old female currently being admitted for post-operative nausea/emesis, abdominal pain, and urinary leakage.  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  Plan for CT scan of the chest, abdomen/pelvis.  CBC and Cmet ordered.  IV fluid D51/2 NS at 100 cc/hr. NPO while awaiting scans.  Antiemetics and analgesics ordered PRN.  Awaiting urine culture results.     Dorothyann Gibbs, NP 02/05/2018, 2:26 PM  Patient seen and examined by me. Agree with above. Added magnesium level to labs. Await CT results.

## 2018-02-05 NOTE — Progress Notes (Signed)
Administered IVP dilaudid per order. Administered and flushed slowly. Patient reported feeling a tightness in the chest and abdoment. Joylene John NP present in room. VSS. EKG performed. Patient reported the tightness abated. Joylene John NP requested further doses be administered higher in line and diluted. Will cont to monitor.

## 2018-02-05 NOTE — Progress Notes (Signed)
Follow Up Note: Gyn-Onc  [name] 77 y.o. female  CC:  Chief Complaint  Patient presents with  . Abdominal Pain, Urinary Incontinence    Post-op    HPI: [name] is a 77 year old female initially referred by Dr. Molli Posey for a complex pelvic mass.  She noted abnormalities in her gait with lead to an MRI of the right hip ordered by Dr. Gladstone Lighter.  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  She was seen by her gynecologist, Dr. Matthew Saras and a pelvic ultrasound was obtained on January 02, 2018. It was notable for the left adnexa measuring 8.1 x 6.6 x 5.9 cm complex partially cystic with solid components.  Blood flow was noted within the solid portion of the mass. Ca 125 returned a value of 11.1.    Family history is notable for a niece with a diagnosis of breast cancer at age 79 and her sister treated for bladder cancer at the age of 55.  Last mammogram was in March 2019 within normal limits and colonoscopy in 2017 was notable for atypical polyps which were removed.   On 01/31/18, she underwent a robotic BSO and washings, exploratory laparotomy, staging including infragastric omentectomy, bilateral pelvic and paraaortic lymphadenectomy, and pertioneal biopsies with Dr. Precious Haws.  Her post-operative course was uneventful. Final pathology revealed: 1. 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 - TUMOR INVOLVES OVARY SURFACE AND LEFT FALLOPIAN TUBE. - SEE ONCOLOGY TABLE. 2. Mesentery, small bowel mesentery biopsy #1 - BENIGN FIBROADIPOSE TISSUE. 3. Ovary and fallopian tube, right - BENIGN OVARY WITH INCLUSION CYSTS. - BENIGN FALLOPIAN TUBE WITH PARATUBAL CYSTS AND ADENOFIBROMA. 4. Omentum, resection for tumor - BENIGN ADIPOSE TISSUE. 5. Peritoneum, biopsy, left diaphragmatic -  BENIGN PERITONEAL TYPE TISSUE. 6. Peritoneum, biopsy, right diaphragmatic - BENIGN PERITONEAL TYPE TISSUE. 7. Mesentery, small bowel mesenteric biopsy #2 - BENIGN FIBROADIPOSE TISSUE. 8. Lymph nodes, regional resection, right pelvic - FIVE OF FIVE LYMPH NODES NEGATIVE FOR CARCINOMA (0/5). 9. Lymph nodes, regional resection, left pelvic - FOUR OF FOUR LYMPH NODES NEGATIVE FOR CARCINOMA (0/4). 10. Peritoneum, biopsy, left gutter - BENIGN PERITONEAL TYPE TISSUE. 11. Peritoneum, biopsy, bladder - BENIGN PERITONEAL TYPE TISSUE WITH ACUTE INFLAMMATION AND CALCIFICATIONS. 1 of 4 Amended copy Amended FINAL for [name], [name] [phone]) Diagnosis(continued) 12. Peritoneum, biopsy, cul-de-sac - BENIGN PERITONEAL TYPE TISSUE WITH ACUTE INFLAMMATION AND CALCIFICATIONS. 13. Soft tissue, biopsy, right gutter - BENIGN FIBROADIPOSE TISSUE. 14. Lymph node, biopsy, right para-aortic - TWO OF TWO LYMPH NODES NEGATIVE FOR CARCINOMA (0/2). 15. Lymph node, biopsy, left para-aortic - ONE OF ONE LYMPH NODES NEGATIVE FOR CARCINOMA (0/1). PERITONEAL WASHING (SPECIMEN 1 OF 1 COLLECTED 01/31/18): MALIGNANT CELLS PRESENT CONSISTENT WITH METASTATIC CARCINOMA.  Interval History:  She presents today with her daughter for evaluation of post-operative abdominal discomfort, absence of flatus and BM, and urinary incontinence.  Last BM was reported on last Tuesday.  One episode of flatus while waiting in the lobby. She has been taking senna-kot with no relief.  States she feels her abdomen feels tight.  "I also can not control my bladder. When I stand up it flows right out and I can not make it to the bathroom."  No dysuria reported or hematuria but states when she gets up to go to the bathroom, she has no control  over her urine.  Reports history of having bladder suspension.  Denies fever or chills.  Unable to eat due to abdominal discomfort but able to tolerate Ensure drinks.  No nausea or emesis reported today.   Ambulating without difficulty.  Using heating pad for pain at home but reports little relief in her symptoms.  Discussed the potential need for admission but patient states she wants to have an enema in the office and try to manage her symptoms outpatient.      Review of Systems: Constitutional: Feels poorly. Positive for decreased appetite.  No fever, chills.     Cardiovascular: No chest pain, shortness of breath, or edema.  Pulmonary: No cough or wheeze.  Gastrointestinal: No nausea, vomiting, or diarrhea. No bright red blood per rectum. Positive for minimal flatus and no BM since Tuesday. Genitourinary:  No vaginal bleeding or discharge.  Musculoskeletal: No myalgia or joint pain Neurologic: No weakness, numbness, or change in gait.  Psychology: No depression, anxiety, or insomnia.  Current Meds:  No facility-administered encounter medications on file as of 02/04/2018.    Outpatient Encounter Medications as of 02/04/2018  Medication Sig  . ALPRAZolam (XANAX) 0.5 MG tablet Take 0.5 mg by mouth at bedtime.   Marland Kitchen amLODipine (NORVASC) 5 MG tablet Take 5 mg by mouth every morning.  . Aspirin-Caffeine (BC FAST PAIN RELIEF PO) Take 1 packet by mouth 3 (three) times daily as needed (pain).  Marland Kitchen atorvastatin (LIPITOR) 20 MG tablet Take 20 mg by mouth every evening.   . Cholecalciferol (VITAMIN D) 2000 units tablet Take 2,000 Units by mouth daily.  . Cyanocobalamin (B-12 COMPLIANCE INJECTION) 1000 MCG/ML KIT Inject 1,000 mcg as directed every 30 (thirty) days.  Marland Kitchen enoxaparin (LOVENOX) 40 MG/0.4ML injection Inject 0.4 mLs (40 mg total) into the skin daily for 26 days.  Marland Kitchen exenatide (BYETTA) 10 MCG/0.04ML SOPN injection Inject 10 mcg into the skin 2 (two) times daily.   Marland Kitchen FLUoxetine (PROZAC) 20 MG capsule Take 20 mg by mouth every morning.  . hydrochlorothiazide (HYDRODIURIL) 25 MG tablet Take 25 mg by mouth every morning.  Marland Kitchen HYDROcodone-acetaminophen (NORCO/VICODIN) 5-325 MG tablet Take 1-2 tablets by  mouth every 4 (four) hours as needed for moderate pain or severe pain.  Marland Kitchen levothyroxine (SYNTHROID, LEVOTHROID) 100 MCG tablet Take 100 mcg by mouth daily before breakfast.  . losartan (COZAAR) 100 MG tablet Take 100 mg by mouth every morning.  . Melatonin 5 MG TABS Take 5-10 mg by mouth at bedtime.  . metFORMIN (GLUCOPHAGE-XR) 500 MG 24 hr tablet Take 1,000 mg by mouth 2 (two) times daily.  Marland Kitchen omeprazole (PRILOSEC OTC) 20 MG tablet Take 20 mg by mouth daily.  . potassium chloride (K-DUR,KLOR-CON) 10 MEQ tablet Take 10 mEq by mouth every evening.   . vitamin E 400 UNIT capsule Take 400 Units by mouth daily.  . diphenhydrAMINE (BENADRYL) 25 MG tablet Take 25 mg by mouth daily as needed for itching (bug bites).  . meloxicam (MOBIC) 15 MG tablet Take 15 mg by mouth once daily    Allergy:  Allergies  Allergen Reactions  . Penicillins Anaphylaxis    Has patient had a PCN reaction causing immediate rash, facial/tongue/throat swelling, SOB or lightheadedness with hypotension: Yes Has patient had a PCN reaction causing severe rash involving mucus membranes or skin necrosis: No Has patient had a PCN reaction that required hospitalization: Yes Has patient had a PCN reaction occurring within the last 10 years: No If all of the above answers are "  NO", then may proceed with Cephalosporin use.     Social Hx:   Social History   Socioeconomic History  . Marital status: Widowed    Spouse name: Not on file  . Number of children: Not on file  . Years of education: Not on file  . Highest education level: Not on file  Occupational History  . Not on file  Social Needs  . Financial resource strain: Not on file  . Food insecurity:    Worry: Not on file    Inability: Not on file  . Transportation needs:    Medical: Not on file    Non-medical: Not on file  Tobacco Use  . Smoking status: Never Smoker  . Smokeless tobacco: Never Used  Substance and Sexual Activity  . Alcohol use: No  . Drug use:  No  . Sexual activity: Not on file  Lifestyle  . Physical activity:    Days per week: Not on file    Minutes per session: Not on file  . Stress: Not on file  Relationships  . Social connections:    Talks on phone: Not on file    Gets together: Not on file    Attends religious service: Not on file    Active member of club or organization: Not on file    Attends meetings of clubs or organizations: Not on file    Relationship status: Not on file  . Intimate partner violence:    Fear of current or ex partner: Not on file    Emotionally abused: Not on file    Physically abused: Not on file    Forced sexual activity: Not on file  Other Topics Concern  . Not on file  Social History Narrative  . Not on file    Past Surgical Hx:  Past Surgical History:  Procedure Laterality Date  . BLADDER SUSPENSION  2002  . BREAST SURGERY     several benign cysts removed; surgeries from [place]   . CHOLECYSTECTOMY  1988  . EYE SURGERY  2010 amd 2012   cataracts removed bilateral   . KNEE ARTHROSCOPY Left    l knee x2  . PARATHYROIDECTOMY  2010  . ROBOTIC ASSISTED SALPINGO OOPHERECTOMY Bilateral 01/31/2018   Procedure: XI ROBOTIC ASSISTED BILATERAL SALPINGO OOPHORECTOMY, STAGING, LAPAROTOMY, PELVIC AND PARA AORTIC LYMPH NODE DISSECTION, OMENTECTOMY;  Surgeon: Isabel Caprice, MD;  Location: [facility];  Service: Gynecology;  Laterality: Bilateral;  . ROTATOR CUFF REPAIR  2005   right   . 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 Arthritis   . Diabetes mellitus without complication ([place])    type 2   . Difficulty sleeping   . GERD (gastroesophageal reflux disease)   . Hypertension   . Hypothyroidism   . Pneumonia    its been 4 years   . PONV (postoperative nausea  and vomiting)   . Vitamin B 12 deficiency     Family Hx:  Family History  Problem Relation Age of Onset  . Lymphoma Mother   . Bladder Cancer Sister   . Prostate cancer Brother   . Leukemia Brother   . Breast cancer Other 71       sister's daughter    Vitals:  Blood  pressure (!) 122/56, pulse 76, temperature 98.2 F (36.8 C), temperature source Oral, height 5' 4.5" (1.638 m), weight 187 lb 14.4 oz (85.2 kg), SpO2 98 %.  Physical Exam:  General: Well developed, well nourished female in no acute distress but appears uncomfortable. Alert and oriented x 3.  Neck: Supple without any enlargements.  Lymph node survey: No cervical, supraclavicular, or inguinal adenopathy.  Cardiovascular: Regular rate and rhythm. S1 and S2 normal.  Lungs: Clear to auscultation bilaterally. No wheezes/crackles/rhonchi noted.  Skin: No rashes or lesions present. Back: No CVA tenderness.  Abdomen: Abdomen soft, non-tender and obese. Active bowel sounds in all quadrants. Slightly tympanic.  Rectal: Good tone, no masses, no cul de sac nodularity. Moderate, hard stool noted in the rectum.  Stool loosened manually with patient tolerating well.  Patient requesting to have an enema in the office. Soap suds enema performed with patient's consent and patient tolerated it well.  24 ounces instilled rectally.  Patient able to hold the enema for 3 minutes.  Assist to the bedside commode in the room and patient able to evacuate hard stool from rectum.  Stating she feels better. Extremities: No bilateral cyanosis, edema, or clubbing.   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  Labs and abd 2 view reviewed with patient and her daughter.  Patient states she feels better  after having a BM from enema. Discussed bowel regimen including Miralax twice daily along with stool softeners twice daily until having regular BMs.  She is going to continue drinking Ensures throughout the day and will contact our office if her symptoms worsen or persist.  Reportable signs and symptoms reviewed.  Urine sample obtained for analysis and culture.  Dr. Gerarda Fraction aware of the above findings.  She is scheduled to have her CT scans for additional staging information this Friday then see Dr. Gerarda Fraction in the office on Monday, February 11, 2018.      Dorothyann Gibbs, NP 02/05/2018, 11:24 AM

## 2018-02-05 NOTE — Telephone Encounter (Signed)
Called patient to tell her that we have a bed for her. Daughter Santiago Glad answered the phone.  Told patient's daughter to go ahead and bring patient to the main entrance of [facility] and check in through admissions.  Patient has been assigned to room 1310. Patient's daughter verbalized understanding.

## 2018-02-05 NOTE — Telephone Encounter (Signed)
Patient called in to say that she has vomited three times since she has been up this morning. Patient states " I tried to drink a cup of coffee because I was very nauseous, but it came right back up".  "I am still having trouble controlling my bladder, and I have taken three does of Miralax with no results, I haven't been able to eat since yesterday".  Informed Joylene John, NP of symptoms. Joylene John, NP setting up direct admit, will notify patient when bed is ready.

## 2018-02-06 ENCOUNTER — Inpatient Hospital Stay: Payer: Medicare HMO | Admitting: Obstetrics

## 2018-02-06 ENCOUNTER — Other Ambulatory Visit: Payer: Self-pay | Admitting: Obstetrics

## 2018-02-06 DIAGNOSIS — K567 Ileus, unspecified: Secondary | ICD-10-CM | POA: Diagnosis present

## 2018-02-06 DIAGNOSIS — Z96652 Presence of left artificial knee joint: Secondary | ICD-10-CM | POA: Diagnosis present

## 2018-02-06 DIAGNOSIS — Z79899 Other long term (current) drug therapy: Secondary | ICD-10-CM | POA: Diagnosis not present

## 2018-02-06 DIAGNOSIS — N289 Disorder of kidney and ureter, unspecified: Secondary | ICD-10-CM | POA: Diagnosis present

## 2018-02-06 DIAGNOSIS — R911 Solitary pulmonary nodule: Secondary | ICD-10-CM | POA: Diagnosis present

## 2018-02-06 DIAGNOSIS — R945 Abnormal results of liver function studies: Secondary | ICD-10-CM | POA: Diagnosis present

## 2018-02-06 DIAGNOSIS — C562 Malignant neoplasm of left ovary: Secondary | ICD-10-CM | POA: Diagnosis present

## 2018-02-06 DIAGNOSIS — E039 Hypothyroidism, unspecified: Secondary | ICD-10-CM | POA: Diagnosis present

## 2018-02-06 DIAGNOSIS — N2889 Other specified disorders of kidney and ureter: Secondary | ICD-10-CM

## 2018-02-06 DIAGNOSIS — R7989 Other specified abnormal findings of blood chemistry: Secondary | ICD-10-CM

## 2018-02-06 DIAGNOSIS — E119 Type 2 diabetes mellitus without complications: Secondary | ICD-10-CM | POA: Diagnosis present

## 2018-02-06 DIAGNOSIS — R112 Nausea with vomiting, unspecified: Secondary | ICD-10-CM | POA: Diagnosis present

## 2018-02-06 DIAGNOSIS — M1990 Unspecified osteoarthritis, unspecified site: Secondary | ICD-10-CM | POA: Diagnosis present

## 2018-02-06 DIAGNOSIS — K219 Gastro-esophageal reflux disease without esophagitis: Secondary | ICD-10-CM | POA: Diagnosis present

## 2018-02-06 DIAGNOSIS — I10 Essential (primary) hypertension: Secondary | ICD-10-CM | POA: Diagnosis present

## 2018-02-06 DIAGNOSIS — R32 Unspecified urinary incontinence: Secondary | ICD-10-CM | POA: Diagnosis present

## 2018-02-06 DIAGNOSIS — Z880 Allergy status to penicillin: Secondary | ICD-10-CM | POA: Diagnosis not present

## 2018-02-06 HISTORY — DX: Other specified abnormal findings of blood chemistry: R79.89

## 2018-02-06 LAB — COMPREHENSIVE METABOLIC PANEL
ALBUMIN: 2.8 g/dL — AB (ref 3.5–5.0)
ALK PHOS: 224 U/L — AB (ref 38–126)
ALT: 163 U/L — ABNORMAL HIGH (ref 14–54)
AST: 222 U/L — AB (ref 15–41)
Anion gap: 10 (ref 5–15)
BILIRUBIN TOTAL: 1.2 mg/dL (ref 0.3–1.2)
BUN: 8 mg/dL (ref 6–20)
CALCIUM: 8.5 mg/dL — AB (ref 8.9–10.3)
CO2: 24 mmol/L (ref 22–32)
Chloride: 102 mmol/L (ref 101–111)
Creatinine, Ser: 0.61 mg/dL (ref 0.44–1.00)
GFR calc Af Amer: >60 mL/min (ref >60)
GFR calc non Af Amer: >60 mL/min (ref >60)
GLUCOSE: 143 mg/dL — AB (ref 65–99)
Potassium: 3.6 mmol/L (ref 3.5–5.1)
SODIUM: 136 mmol/L (ref 135–145)
TOTAL PROTEIN: 5.8 g/dL — AB (ref 6.5–8.1)

## 2018-02-06 LAB — GLUCOSE, CAPILLARY
GLUCOSE-CAPILLARY: 157 mg/dL — AB (ref 65–99)
Glucose-Capillary: 117 mg/dL — ABNORMAL HIGH (ref 65–99)
Glucose-Capillary: 138 mg/dL — ABNORMAL HIGH (ref 65–99)
Glucose-Capillary: 142 mg/dL — ABNORMAL HIGH (ref 65–99)
Glucose-Capillary: 145 mg/dL — ABNORMAL HIGH (ref 65–99)
Glucose-Capillary: 150 mg/dL — ABNORMAL HIGH (ref 65–99)

## 2018-02-06 LAB — MAGNESIUM: Magnesium: 2 mg/dL (ref 1.7–2.4)

## 2018-02-06 MED ORDER — DOCUSATE SODIUM 100 MG PO CAPS
100.0000 mg | ORAL_CAPSULE | Freq: Two times a day (BID) | ORAL | Status: DC
  Administered 2018-02-06 – 2018-02-08 (×5): 100 mg via ORAL
  Filled 2018-02-06 (×5): qty 1

## 2018-02-06 NOTE — Progress Notes (Addendum)
GYN Oncology  Subjective: Patient reports feeling a little better this am.  Denies nausea but states she does not feel that she could eat anything.  She is going to try some clear liquids this am.  Continues to reports left abdominal pain but states it is similar to the pain she experienced post-operatively prior to this admission.  No flatus or BM reported.  Ambulating without difficulty.  Reports improvement in urinary incontinence episodes with only one last pm. Denies chest pain, dyspnea.  Concerned about the renal lesion seen on CT. She does state she was told in the past that she had elevated liver function tests after taking Tylenol.    Objective: Vital signs in last 24 hours: Temp:  [98 F (36.7 C)-99.2 F (37.3 C)] 98 F (36.7 C) (04/24 0949) Pulse Rate:  [68-81] 68 (04/24 0949) Resp:  [13-20] 16 (04/24 0949) BP: (117-165)/(64-84) 128/65 (04/24 0949) SpO2:  [89 %-98 %] 93 % (04/24 0949) Weight:  [187 lb 14.4 oz (85.2 kg)] 187 lb 14.4 oz (85.2 kg) (04/23 1314) Last BM Date: 01/30/18  Intake/Output from previous day: 04/23 0701 - 04/24 0700 In: 1875 [P.O.:60; I.V.:1765; IV Piggyback:50] Out: 150 [Urine:150]  Physical Examination: General: alert, cooperative and no distress Resp: clear to auscultation bilaterally Cardio: regular rate and rhythm, S1, S2 normal, no murmur, click, rub or gallop GI: incision: abdomen incision with steri strips with resolving ecchymosis, no evidence of infection, lap sites healing and abdomen soft, slightly tender, hypoactive bowel sounds Extremities: extremities normal, atraumatic, no cyanosis or edema  Labs: WBC/Hgb/Hct/Plts:  10.0/12.2/37.2/276 (04/23 1218) BUN/Cr/glu/ALT/AST/amyl/lip:  8/0.61/--/163/222/--/-- (04/24 1610)  Assessment: 77 y.o. s/p : stable. Currently admitted for post-operative nausea/emesis, abdominal pain s/p robotic BSO and washings, exploratory laparotomy, staging including infragastric omentectomy, bilateral pelvic and  paraaortic lymphadenectomy, and pertioneal biopsies on 01/31/18 for ovarian cancer.   Pain:  Pain is well-controlled on PRN medications.  Heme: Hgb 12.2 and Hct 37.2 this am.  Stable.  CV: BP and HR stable.  Norvasc and Losartan ordered.  GI:  Tolerating po: No: NPO but changed to clear liquids. Antiemetics ordered PRN.  CT AP: IMPRESSION: 1. 16 mm exophytic lesion upper pole right kidney has attenuation too high to be a simple cyst. This may be a cyst complicated by proteinaceous debris or hemorrhage, but renal cell carcinoma is concern. Routine outpatient follow-up MRI of the abdomen without and with contrast recommended after resolution of patient's acute symptoms (so she is better able to participate with positioning and breath holding). 2. Mild edema/possible minimal hemorrhage in the extraperitoneal soft tissues of the pelvic for tracking up both pelvic sidewalls. Imaging appearance is not outside of the spectrum of findings expected 6 days after the reported surgery. Trace interloop mesenteric and free fluid is identified in the peritoneal cavity in there is some mild edema in the omentum. There is no organized or rim enhancing collection to suggest evolving abscess. 3. Gas in the extraperitoneal soft tissues of the left lower quadrant is associated with gas in the subcutaneous fat of the lower left anterior abdominal wall. This is not unexpected on postoperative day 6. No evidence for intraperitoneal free air. 4. No CT features to suggest small bowel obstruction. Oral contrast material has migrated about halfway through the small bowel loops but there is no differential distention of proximal versus distal small bowel. The colon is diffusely distended and fluid-filled proximally, but formed stool is noted in the distal colon. Given apparent slow migration of contrast and fluid-filled  small and large bowel loops, a component of ileus is possible.  Elevated LFTs: AST 222 ALT 163 Alkaline phos  224.  Current medication reviewed with pharmacist. Tylenol PRN discontinued.  Repeat in the am.   GU: Voiding.  Urine culture negative. Decrease in episodes of incontinence.    FEN: Magnesium 2.0 this am after replacement from 1.5 yesterday.     Endo: Diabetes mellitus Type II, under good control..  CBG:  CBG (last 3)  Recent Labs    02/06/18 0005 02/06/18 0415 02/06/18 0805  GLUCAP 145* 142* 117*    Prophylaxis: intermittent pneumatic compression boots and lovenox 40 mg SQ daily.  Plan: Advance diet to clears Tylenol discontinued Repeat labs in the am Encourage ambulation, IS use deep breathing, and coughing Continue plan of care per Dr. Gerarda Fraction    LOS: 0 days    Dorothyann Gibbs 02/06/2018, 10:37 AM    Patient seen and examined by me.  Reviewed CTScan findings. Will order outpatient MRI to followup renal findings. Recommend lung nodule to be followed on future scans. For ileus, slow diet advancement.  Watch LFT's and hold Tylenol and all pain pills with Tylenol. On discharge I would prefer she have prescription for oxycodone 2.5mg -5mg  if still requiring pain medication. She says she was told to avoid NSAIDs.

## 2018-02-07 ENCOUNTER — Inpatient Hospital Stay (HOSPITAL_COMMUNITY): Payer: Medicare HMO

## 2018-02-07 ENCOUNTER — Telehealth: Payer: Self-pay | Admitting: Gynecologic Oncology

## 2018-02-07 LAB — HEPATIC FUNCTION PANEL
ALBUMIN: 2.7 g/dL — AB (ref 3.5–5.0)
ALT: 107 U/L — AB (ref 14–54)
AST: 72 U/L — AB (ref 15–41)
Alkaline Phosphatase: 196 U/L — ABNORMAL HIGH (ref 38–126)
Bilirubin, Direct: 0.2 mg/dL (ref 0.1–0.5)
Indirect Bilirubin: 0.6 mg/dL (ref 0.3–0.9)
Total Bilirubin: 0.8 mg/dL (ref 0.3–1.2)
Total Protein: 5.4 g/dL — ABNORMAL LOW (ref 6.5–8.1)

## 2018-02-07 LAB — MAGNESIUM: Magnesium: 1.8 mg/dL (ref 1.7–2.4)

## 2018-02-07 LAB — GLUCOSE, CAPILLARY
GLUCOSE-CAPILLARY: 138 mg/dL — AB (ref 65–99)
GLUCOSE-CAPILLARY: 139 mg/dL — AB (ref 65–99)
Glucose-Capillary: 142 mg/dL — ABNORMAL HIGH (ref 65–99)
Glucose-Capillary: 129 mg/dL — ABNORMAL HIGH (ref 65–99)
Glucose-Capillary: 118 mg/dL — ABNORMAL HIGH (ref 65–99)
Glucose-Capillary: 121 mg/dL — ABNORMAL HIGH (ref 65–99)
Glucose-Capillary: 151 mg/dL — ABNORMAL HIGH (ref 65–99)

## 2018-02-07 MED ORDER — OMEPRAZOLE 20 MG PO CPDR
20.0000 mg | DELAYED_RELEASE_CAPSULE | Freq: Every day | ORAL | Status: DC
  Administered 2018-02-07 – 2018-02-08 (×2): 20 mg via ORAL
  Filled 2018-02-07 (×2): qty 1

## 2018-02-07 MED ORDER — OMEPRAZOLE MAGNESIUM 20 MG PO TBEC: 20.0000 mg | DELAYED_RELEASE_TABLET | Freq: Every day | ORAL | Status: DC

## 2018-02-07 MED ORDER — GLYCERIN (LAXATIVE) 2.1 G RE SUPP
1.0000 | Freq: Once | RECTAL | Status: AC
  Administered 2018-02-07: 1 via RECTAL
  Filled 2018-02-07: qty 1

## 2018-02-07 MED ORDER — SIMETHICONE 80 MG PO CHEW
160.0000 mg | CHEWABLE_TABLET | Freq: Four times a day (QID) | ORAL | Status: DC | PRN
  Administered 2018-02-07: 160 mg via ORAL
  Filled 2018-02-07: qty 2

## 2018-02-07 MED ORDER — INSULIN ASPART 100 UNIT/ML ~~LOC~~ SOLN: 0.0000 [IU] | Freq: Every day | SUBCUTANEOUS | Status: DC

## 2018-02-07 MED ORDER — OXYCODONE HCL 5 MG PO TABS
5.0000 mg | ORAL_TABLET | ORAL | Status: DC | PRN
  Administered 2018-02-07 – 2018-02-08 (×2): 5 mg via ORAL
  Filled 2018-02-07 (×2): qty 1

## 2018-02-07 MED ORDER — INSULIN ASPART 100 UNIT/ML ~~LOC~~ SOLN
0.0000 [IU] | Freq: Three times a day (TID) | SUBCUTANEOUS | Status: DC
  Administered 2018-02-08: 3 [IU] via SUBCUTANEOUS

## 2018-02-07 NOTE — Progress Notes (Signed)
Patient states she still feels abdominal fullness/soreness and she feels she can hardly eat anything at times.  No nausea or emesis reported.  Discussed interventions to assist with bowel function return.  No needs voiced.

## 2018-02-07 NOTE — Telephone Encounter (Signed)
Patient notified of abd xray results.  No new interventions at this time.

## 2018-02-07 NOTE — Progress Notes (Signed)
GYN Oncology  Subjective: Patient reports tolerating full liquids with no nausea or emesis but states she feels that her abdomen is tight.  One episode of flatus reported this am and no bowel movement.  Ambulating without difficulty.  Urinary incontinence is improving but she has been urinating frequently due to IVF.  Asking when she will get some relief of her abdominal symptoms.  Denies chest pain, dyspnea.  No concerns voiced.         Objective: Vital signs in last 24 hours: Temp:  [98.5 F (36.9 C)-99.4 F (37.4 C)] 99.4 F (37.4 C) (04/25 1232) Pulse Rate:  [68-86] 73 (04/25 1232) Resp:  [12-16] 14 (04/25 1232) BP: (100-135)/(49-65) 117/65 (04/25 1232) SpO2:  [91 %-98 %] 95 % (04/25 1232) Last BM Date: 01/30/18  Intake/Output from previous day: 04/24 0701 - 04/25 0700 In: 2615 [P.O.:600; I.V.:2015] Out: 1451 [Urine:1451]  Physical Examination: General: alert, cooperative and no distress Resp: clear to auscultation bilaterally Cardio: regular rate and rhythm, S1, S2 normal, no murmur, click, rub or gallop GI: active bowel sounds in all quadrants, abdomen tympanic more in RUQ, slightly tender in lower abdomen, soft, slightly distended Extremities: extremities normal, atraumatic, no cyanosis or edema  Abdominal midline incision healing with steri strips in place.  Resolving ecchymosis present.  Lap sites with dermabond without drainage or signs of infection  Labs:   BUN/Cr/glu/ALT/AST/amyl/lip:  --/--/--/107/72/--/-- (04/25 3009)  Assessment: 77 y.o. currently admitted for post-operative nausea/vomiting, abdominal pain, significant urinary leakage. Pain:  Pain controlled with PRN medications.  Kpad at the bedside.  Heme: Hgb 12.2 and Hct 37.2 from 02/05/18. Stable.  CV: BP and HR stable. Norvasc and Losartan ordered.    GI:  Tolerating po: yes full liquids.  Antiemetics ordered PRN but patient has not had to use.  GU: Urine culture negative.  Incontinent episodes  improving.  Adequate output reported.  FEN: Magnesium 1.8 s/p replacement on 02/05/18.  Endo: Type 2 Diabetes under good control.  CBG (last 3)  Recent Labs    02/07/18 0550 02/07/18 0812 02/07/18 1202  GLUCAP 129* 138* 118*    Prophylaxis: Lovenox ordered and SCDs.  Plan: Saline lock IV Advance diet to soft diet Glycerin suppository per Dr. Delsa Sale Encourage ambulation, IS use, deep breathing and coughing Continue plan of care per Dr. Delsa Sale   LOS: 1 day    [name] [name] 02/07/2018, 1:12 PM

## 2018-02-08 ENCOUNTER — Ambulatory Visit (HOSPITAL_COMMUNITY): Payer: Medicare HMO

## 2018-02-08 LAB — HEPATIC FUNCTION PANEL
ALBUMIN: 2.6 g/dL — AB (ref 3.5–5.0)
ALT: 63 U/L — ABNORMAL HIGH (ref 14–54)
AST: 21 U/L (ref 15–41)
Alkaline Phosphatase: 157 U/L — ABNORMAL HIGH (ref 38–126)
Bilirubin, Direct: 0.2 mg/dL (ref 0.1–0.5)
Indirect Bilirubin: 0.5 mg/dL (ref 0.3–0.9)
TOTAL PROTEIN: 5.6 g/dL — AB (ref 6.5–8.1)
Total Bilirubin: 0.7 mg/dL (ref 0.3–1.2)

## 2018-02-08 LAB — GLUCOSE, CAPILLARY: Glucose-Capillary: 161 mg/dL — ABNORMAL HIGH (ref 65–99)

## 2018-02-08 MED ORDER — OXYCODONE HCL 5 MG PO TABS: 5.0000 mg | ORAL_TABLET | ORAL | 0 refills | Status: DC | PRN

## 2018-02-08 NOTE — Discharge Instructions (Signed)
02/08/2018  Activity: 1. Be up and out of the bed during the day.  Take a nap if needed.  You may walk up steps but be careful and use the hand rail.  Stair climbing will tire you more than you think, you may need to stop part way and rest.   2. No lifting or straining for 6 weeks from surgery.  3. No driving for 2 week(s) from surgery.  Do not drive if you are taking narcotic pain medicine.  4. Shower daily.  Use soap and water on your incision and pat dry; don't rub.  No tub baths until cleared by your surgeon.   5. No sexual activity and nothing in the vagina for 6 weeks from surgery.  6. You may experience a small amount of clear drainage from your incisions, which is normal.  If the drainage persists or increases, please call the office.  7. You may experience vaginal spotting after surgery or around the 6-8 week mark from surgery when the stitches at the top of the vagina begin to dissolve.  The spotting is normal but if you experience heavy bleeding, call our office.  8. Take ibuprofen first for pain and only use oxycodone for severe pain not relieved by the Ibuprofen.    Diet: 1. Low sodium Heart Healthy Diet is recommended.  2. It is safe to use a laxative, such as Miralax or Colace, if you have difficulty moving your bowels. You can take Sennakot at bedtime every evening to keep bowel movements regular and to prevent constipation.    Wound Care: 1. Keep clean and dry.  Shower daily.  Reasons to call the Doctor:  Fever - Oral temperature greater than 100.4 degrees Fahrenheit  Foul-smelling vaginal discharge  Difficulty urinating  Nausea and vomiting  Increased pain at the site of the incision that is unrelieved with pain medicine.  Difficulty breathing with or without chest pain  New calf pain especially if only on one side  Sudden, continuing increased vaginal bleeding with or without clots.   Contacts: For questions or concerns you should contact:  Dr. Precious Haws at [place], NP at [phone]  After Hours: call [phone] and have the GYN Oncologist paged/contacted

## 2018-02-08 NOTE — Discharge Summary (Signed)
Physician Discharge Summary  Patient ID: [name] MRN: [id number] DOB/AGE: [birth year] 77 y.o.  Admit date: 02/05/2018 Discharge date: 02/08/2018  Admission Diagnoses: Postoperative nausea and vomiting  Discharge Diagnoses:  Principal Problem:   Postoperative nausea and vomiting Active Problems:   Elevated liver function tests   Discharged Condition:  The patient is in good condition and stable for discharge.    Hospital Course: The patient was admitted on 02/05/18 for post-operative nausea/emesis, abdominal pain, lack of flatus/BM, moderate urinary incontinence.  S/P Robotic BSO and washings, Exploratory laparotomy, Staging including infragastric omentectomy, Bilateral pelvic and paraaortic lymphadenectomy, pertioneal biopsies on 01/31/18 with Dr. Precious Haws.  CT chest, abdomen, and pelvis was obtained.  After bowel rest, IV hydration, IV analgesics and IV antiemetics PRN, the patient was able to tolerate solid food with intermittent episodes of flatus.  Abdominal pain improvement to mild left abdominal soreness.  Incontinence episodes improved.  She is discharged home tolerating solid food, ambulating, pain controlled, voiding.  She is to have an MRI of the kidney scheduled once discharged to follow up in the renal lesion noted.  She will be set up to meet with Dr. Heath Lark to discuss chemotherapy.      Consults: None  Significant Diagnostic Studies: CT CAP. Abdomen 2 view CT CAP 02/06/18: IMPRESSION: 1. 16 mm exophytic lesion upper pole right kidney has attenuation too high to be a simple cyst. This may be a cyst complicated by proteinaceous debris or hemorrhage, but renal cell carcinoma is a concern. Routine outpatient follow-up MRI of the abdomen without and with contrast recommended after resolution of patient's acute symptoms (so she is better able to participate with positioning and breath holding). 2. Mild edema/possible minimal hemorrhage in the extraperitoneal soft  tissues of the pelvic for tracking up both pelvic sidewalls. Imaging appearance is not outside of the spectrum of findings expected 6 days after the reported surgery. Trace interloop mesenteric and free fluid is identified in the peritoneal cavity in there is some mild edema in the omentum. There is no organized or rim enhancing collection to suggest evolving abscess. 3. Gas in the extraperitoneal soft tissues of the left lower quadrant is associated with gas in the subcutaneous fat of the lower left anterior abdominal wall. This is not unexpected on postoperative day 6. No evidence for intraperitoneal free air. 4. No CT features to suggest small bowel obstruction. Oral contrast material has migrated about halfway through the small bowel loops but there is no differential distention of proximal versus distal small bowel. The colon is diffusely distended and fluid-filled proximally, but formed stool is noted in the distal colon. Given apparent slow migration of contrast and fluid-filled small and large bowel loops, a component of ileus is possible.   DG 2 View 02/07/18: IMPRESSION: Nonobstructed gas pattern with contrast in the colon.  Treatments: IV hydration. IV analgesics and antiemetics PRN.  Discharge Exam: Blood pressure (!) 117/55, pulse 73, temperature 99 F (37.2 C), temperature source Oral, resp. rate 16, height 5' 4.49" (1.638 m), weight 187 lb 14.4 oz (85.2 kg), SpO2 96 %. General appearance: alert, cooperative and no distress Resp: clear to auscultation bilaterally Cardio: regular rate and rhythm, S1, S2 normal, no murmur, click, rub or gallop GI: soft, non-tender; bowel sounds normal; no masses,  no organomegaly Extremities: extremities normal, atraumatic, no cyanosis or edema Incision/Wound: midline incision with steri strips in place with resolving ecchymosis with no active drainage or erythema.  Lap sites with dermabond healing without drainage  or erythema.  Disposition: Discharge  disposition: 01-Home or Self Care       Discharge Instructions    Call MD for:  difficulty breathing, headache or visual disturbances   Complete by:  As directed    Call MD for:  extreme fatigue   Complete by:  As directed    Call MD for:  hives   Complete by:  As directed    Call MD for:  persistant dizziness or light-headedness   Complete by:  As directed    Call MD for:  persistant nausea and vomiting   Complete by:  As directed    Call MD for:  redness, tenderness, or signs of infection (pain, swelling, redness, odor or green/yellow discharge around incision site)   Complete by:  As directed    Call MD for:  severe uncontrolled pain   Complete by:  As directed    Call MD for:  temperature >100.4   Complete by:  As directed    Diet - low sodium heart healthy   Complete by:  As directed    Driving Restrictions   Complete by:  As directed    No driving for 2 weeks from surgery date.  Do not take narcotics and drive.   Increase activity slowly   Complete by:  As directed    Lifting restrictions   Complete by:  As directed    No lifting greater than 10 lbs.   Sexual Activity Restrictions   Complete by:  As directed    No sexual activity, nothing in the vagina, for 6 weeks.     Allergies as of 02/08/2018      Reactions   Penicillins Anaphylaxis   Has patient had a PCN reaction causing immediate rash, facial/tongue/throat swelling, SOB or lightheadedness with hypotension: Yes Has patient had a PCN reaction causing severe rash involving mucus membranes or skin necrosis: No Has patient had a PCN reaction that required hospitalization: Yes Has patient had a PCN reaction occurring within the last 10 years: No If all of the above answers are "NO", then may proceed with Cephalosporin use.      Medication List    STOP taking these medications   HYDROcodone-acetaminophen 5-325 MG tablet Commonly known as:  NORCO/VICODIN     TAKE these medications   ALPRAZolam 0.5 MG  tablet Commonly known as:  XANAX Take 0.5 mg by mouth at bedtime.   amLODipine 5 MG tablet Commonly known as:  NORVASC Take 5 mg by mouth every morning.   atorvastatin 20 MG tablet Commonly known as:  LIPITOR Take 20 mg by mouth every evening.   B-12 COMPLIANCE INJECTION 1000 MCG/ML Kit Generic drug:  Cyanocobalamin Inject 1,000 mcg as directed every 30 (thirty) days.   diphenhydrAMINE 25 MG tablet Commonly known as:  BENADRYL Take 25 mg by mouth daily as needed for itching (bug bites).   docusate sodium 100 MG capsule Commonly known as:  COLACE Take 200 mg by mouth daily as needed (severe constipation).   enoxaparin 40 MG/0.4ML injection Commonly known as:  LOVENOX Inject 0.4 mLs (40 mg total) into the skin daily for 26 days.   exenatide 10 MCG/0.04ML Sopn injection Commonly known as:  BYETTA Inject 10 mcg into the skin 2 (two) times daily.   feeding supplement (GLUCERNA SHAKE) Liqd Take 237 mLs by mouth daily.   FLUoxetine 20 MG capsule Commonly known as:  PROZAC Take 20 mg by mouth every morning.   hydrochlorothiazide 25 MG tablet Commonly   known as:  HYDRODIURIL Take 25 mg by mouth every morning.   levothyroxine 100 MCG tablet Commonly known as:  SYNTHROID, LEVOTHROID Take 100 mcg by mouth daily before breakfast.   losartan 100 MG tablet Commonly known as:  COZAAR Take 100 mg by mouth every morning.   Melatonin 5 MG Tabs Take 5-10 mg by mouth at bedtime.   meloxicam 15 MG tablet Commonly known as:  MOBIC Take 15 mg by mouth once daily   metFORMIN 500 MG 24 hr tablet Commonly known as:  GLUCOPHAGE-XR Take 1,000 mg by mouth 2 (two) times daily.   omeprazole 20 MG tablet Commonly known as:  PRILOSEC OTC Take 20 mg by mouth daily.   oxyCODONE 5 MG immediate release tablet Commonly known as:  Oxy IR/ROXICODONE Take 1 tablet (5 mg total) by mouth every 4 (four) hours as needed for moderate pain.   polyethylene glycol packet Commonly known as:   MIRALAX / GLYCOLAX Take 17 g by mouth daily.   potassium chloride 10 MEQ tablet Commonly known as:  K-DUR,KLOR-CON Take 10 mEq by mouth every evening.   SENNA PO Take 3 tablets by mouth daily as needed (severe constipation).   Vitamin D 2000 units tablet Take 2,000 Units by mouth daily.   vitamin E 400 UNIT capsule Take 400 Units by mouth daily.        Greater than thirty minutes were spend for face to face discharge instructions and discharge orders/summary in EPIC.   Signed: Melissa D Cross 02/08/2018, 10:47 AM     

## 2018-02-11 ENCOUNTER — Inpatient Hospital Stay (HOSPITAL_BASED_OUTPATIENT_CLINIC_OR_DEPARTMENT_OTHER): Payer: Medicare HMO | Admitting: Obstetrics

## 2018-02-11 ENCOUNTER — Ambulatory Visit: Payer: Medicare HMO | Admitting: Obstetrics

## 2018-02-11 ENCOUNTER — Encounter: Payer: Self-pay | Admitting: Obstetrics

## 2018-02-11 ENCOUNTER — Telehealth: Payer: Self-pay | Admitting: *Deleted

## 2018-02-11 ENCOUNTER — Inpatient Hospital Stay: Payer: Medicare HMO

## 2018-02-11 VITALS — BP 111/62 | HR 68 | Temp 98.5°F | Resp 20 | Ht 64.5 in | Wt 181.2 lb

## 2018-02-11 DIAGNOSIS — Z7189 Other specified counseling: Secondary | ICD-10-CM

## 2018-02-11 DIAGNOSIS — C562 Malignant neoplasm of left ovary: Secondary | ICD-10-CM

## 2018-02-11 DIAGNOSIS — Z90722 Acquired absence of ovaries, bilateral: Secondary | ICD-10-CM

## 2018-02-11 DIAGNOSIS — R350 Frequency of micturition: Secondary | ICD-10-CM | POA: Diagnosis not present

## 2018-02-11 DIAGNOSIS — R5082 Postprocedural fever: Secondary | ICD-10-CM

## 2018-02-11 DIAGNOSIS — R110 Nausea: Secondary | ICD-10-CM

## 2018-02-11 DIAGNOSIS — Z9889 Other specified postprocedural states: Principal | ICD-10-CM

## 2018-02-11 DIAGNOSIS — R911 Solitary pulmonary nodule: Secondary | ICD-10-CM

## 2018-02-11 DIAGNOSIS — N83209 Unspecified ovarian cyst, unspecified side: Secondary | ICD-10-CM | POA: Diagnosis present

## 2018-02-11 DIAGNOSIS — Z90710 Acquired absence of both cervix and uterus: Secondary | ICD-10-CM | POA: Diagnosis not present

## 2018-02-11 LAB — URINALYSIS, COMPLETE (UACMP) WITH MICROSCOPIC
Bilirubin Urine: NEGATIVE
GLUCOSE, UA: NEGATIVE mg/dL
HGB URINE DIPSTICK: NEGATIVE
Ketones, ur: NEGATIVE mg/dL
NITRITE: NEGATIVE
PH: 6 (ref 5.0–8.0)
Protein, ur: 30 mg/dL — AB
Specific Gravity, Urine: 1.016 (ref 1.005–1.030)

## 2018-02-11 MED ORDER — ONDANSETRON HCL 4 MG PO TABS: 4.0000 mg | ORAL_TABLET | Freq: Three times a day (TID) | ORAL | 0 refills | Status: DC | PRN

## 2018-02-11 NOTE — Telephone Encounter (Signed)
Called and gave the patient the appt to see [name] on May 6th at 10:15am

## 2018-02-11 NOTE — Patient Instructions (Signed)
1. We will check your urine today 2. Return in one month for a final check prior to disposition to surveillance 3. Appointments are going to be made for your MRI and to see [name]

## 2018-02-12 ENCOUNTER — Telehealth: Payer: Self-pay

## 2018-02-12 LAB — URINE CULTURE

## 2018-02-12 NOTE — Telephone Encounter (Signed)
Told [name] that the urine culture showed that it was contaminated. Pt afebrile.  She will call if she develops a fever or if her symptoms persist she can come in and give another urine specimen per Melissa Cross,NP.

## 2018-02-13 NOTE — Progress Notes (Signed)
Progress Note: Gyn-Onc  Consult was initally requested by Dr. Matthew Saras   CC:  Chief Complaint  Patient presents with  . Malignant neoplasm of left ovary [facility])    HPI: [name] is a 77 y.o.    Interval History Since her last visit I did take her to surgery. Unfortunately the frozen section revealed malignancy and so a laparotomy, TAH/BSO, staging was ultimately performed 01/31/2018. She did have a readmission for ileus which resolved. She continues to have difficulty with having bowel movements requiring enemas and laxative to move her bowels. She is very focused on her bowel function. She has signficant fatigue since surgery. Questionable fever on 02/08/18 but none since. States 101, but 99 range since. She is quite anxious and at times tearful during her visit today. She worries about not feeling back to her full strength since her large staging surgery 11 days ago.    Presenting History She was referred to [place] with an 8 cm complex pelvic mass and normal CA 125 of 11 on 01/09/18, no family history of a second-degree relative with breast cancer in first-degree relative with bladder cancer.  She has a history of hysterectomy in 1984.  Recommendations were for bilateral salpingo-oophorectomy with staging and debulking based on operative findings. On her first visit with [place], Dr. Skeet Latch reviewed, the benefits of a minimally invasive approach which include short hospitalization with her return to function.  She is aware that if there is intraoperative adhesive disease or other intraoperative processes that may this procedure safe for by a laparotomy that would be performed.  She was counseled for robotic assisted bilateral salpingo-oophorectomy possible omentectomy lymph node dissection biopsies debulking and other indicated procedures including laparotomy.  Risks of the procedures discussed were infection bleeding damage to surrounding structures prolonged hospitalization and reoperation and  DVT.   The surgery was placed on my schedule to help expedite the procedure. She returns today to review the plans and discuss options. I have been unable to obtain the images on ultrasound due to the nature of the study. The radiologist who read the MRI is to call me this morning to review the level of concern for malignancy.  I confirmed with the patient her social constraints with upcoming family plans and those include a graduation of a grand-daughter in one month. I re-reviewed her PCN allergy which sounds like a true reaction. She was in [employer] 7, had a "sinus infection" was given PCN injection then breathing worsened, she became swollen, and was taken to hospital in [place].  She is symptomatic with increased urinary frequency.   Presenting History: [name] is a 77 y.o. for the 2 para 2 with abnormalities in gait that prompted an MRI of the right hip.  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  She then reported left-sided back pain Pelvic ultrasound on January 02, 2018 is notable for left adnexa measuring 8.1 x 6.6 x 5.9 cm complex partially cystic with solid components.  Blood flow was noted within the solid portion of the mass. Ca1 25 returned a value of 11.1.  Family history is notable for a niece with a diagnosis of breast cancer at age 22.  She also reports that her sister treated for bladder cancer at the age of 67.  His Reports constipation with intermittent diarrhea.  She denies abdominal bloating or weight gain.  There is no bleeding  of the rectum bladder or vagina or cough.  Last mammogram was in March 2019 within normal limits and colonoscopy in 2017 was notable for atypical polyps which were removed.    Review of Systems:  Review of Systems  Musculoskeletal: Positive for back pain.  All other systems reviewed and are negative.    Current Meds:  Outpatient  Encounter Medications as of 02/11/2018  Medication Sig  . ALPRAZolam (XANAX) 0.5 MG tablet Take 0.5 mg by mouth at bedtime.   Marland Kitchen amLODipine (NORVASC) 5 MG tablet Take 5 mg by mouth every morning.  Marland Kitchen atorvastatin (LIPITOR) 20 MG tablet Take 20 mg by mouth every evening.   . Cholecalciferol (VITAMIN D) 2000 units tablet Take 2,000 Units by mouth daily.  . Cyanocobalamin (B-12 COMPLIANCE INJECTION) 1000 MCG/ML KIT Inject 1,000 mcg as directed every 30 (thirty) days.  Marland Kitchen docusate sodium (COLACE) 100 MG capsule Take 200 mg by mouth daily as needed (severe constipation).  . enoxaparin (LOVENOX) 40 MG/0.4ML injection Inject 0.4 mLs (40 mg total) into the skin daily for 26 days.  Marland Kitchen exenatide (BYETTA) 10 MCG/0.04ML SOPN injection Inject 10 mcg into the skin 2 (two) times daily.   . feeding supplement, GLUCERNA SHAKE, (GLUCERNA SHAKE) LIQD Take 237 mLs by mouth daily.  Marland Kitchen FLUoxetine (PROZAC) 20 MG capsule Take 20 mg by mouth every morning.  . hydrochlorothiazide (HYDRODIURIL) 25 MG tablet Take 25 mg by mouth every morning.  Marland Kitchen levothyroxine (SYNTHROID, LEVOTHROID) 100 MCG tablet Take 100 mcg by mouth daily before breakfast.  . losartan (COZAAR) 100 MG tablet Take 100 mg by mouth every morning.  . Melatonin 5 MG TABS Take 5-10 mg by mouth at bedtime.  . metFORMIN (GLUCOPHAGE-XR) 500 MG 24 hr tablet Take 1,000 mg by mouth 2 (two) times daily.  Marland Kitchen omeprazole (PRILOSEC OTC) 20 MG tablet Take 20 mg by mouth daily.  Marland Kitchen oxyCODONE (OXY IR/ROXICODONE) 5 MG immediate release tablet Take 1 tablet (5 mg total) by mouth every 4 (four) hours as needed for moderate pain.  . potassium chloride (K-DUR,KLOR-CON) 10 MEQ tablet Take 10 mEq by mouth every evening.   . SENNA PO Take 3 tablets by mouth daily as needed (severe constipation).  . vitamin E 400 UNIT capsule Take 400 Units by mouth daily.  . ondansetron (ZOFRAN) 4 MG tablet Take 1 tablet (4 mg total) by mouth every 8 (eight) hours as needed for nausea or vomiting.  .  [DISCONTINUED] diphenhydrAMINE (BENADRYL) 25 MG tablet Take 25 mg by mouth daily as needed for itching (bug bites).  . [DISCONTINUED] meloxicam (MOBIC) 15 MG tablet Take 15 mg by mouth once daily  . [DISCONTINUED] polyethylene glycol (MIRALAX / GLYCOLAX) packet Take 17 g by mouth daily.   No facility-administered encounter medications on file as of 02/11/2018.     Allergy:  Allergies  Allergen Reactions  . Penicillins Anaphylaxis    Has patient had a PCN reaction causing immediate rash, facial/tongue/throat swelling, SOB or lightheadedness with hypotension: Yes Has patient had a PCN reaction causing severe rash involving mucus membranes or skin necrosis: No Has patient had a PCN reaction that required hospitalization: Yes Has patient had a PCN reaction occurring within the last 10 years: No If all of the above answers are "NO", then may proceed with Cephalosporin use.     Social Hx:   Social History   Socioeconomic History  . Marital status: Widowed    Spouse name: Not on file  . Number of children: Not on file  .  Years of education: Not on file  . Highest education level: Not on file  Occupational History  . Not on file  Social Needs  . Financial resource strain: Not on file  . Food insecurity:    Worry: Not on file    Inability: Not on file  . Transportation needs:    Medical: Not on file    Non-medical: Not on file  Tobacco Use  . Smoking status: Never Smoker  . Smokeless tobacco: Never Used  Substance and Sexual Activity  . Alcohol use: No  . Drug use: No  . Sexual activity: Not on file  Lifestyle  . Physical activity:    Days per week: Not on file    Minutes per session: Not on file  . Stress: Not on file  Relationships  . Social connections:    Talks on phone: Not on file    Gets together: Not on file    Attends religious service: Not on file    Active member of club or organization: Not on file    Attends meetings of clubs or organizations: Not on file     Relationship status: Not on file  . Intimate partner violence:    Fear of current or ex partner: Not on file    Emotionally abused: Not on file    Physically abused: Not on file    Forced sexual activity: Not on file  Other Topics Concern  . Not on file  Social History Narrative  . Not on file    Past Surgical Hx:  Past Surgical History:  Procedure Laterality Date  . BLADDER SUSPENSION  2002  . BREAST SURGERY     several benign cysts removed; surgeries from [place]   . CHOLECYSTECTOMY  1988  . EYE SURGERY  2010 amd 2012   cataracts removed bilateral   . KNEE ARTHROSCOPY Left    l knee x2  . PARATHYROIDECTOMY  2010  . ROBOTIC ASSISTED SALPINGO OOPHERECTOMY Bilateral 01/31/2018   Procedure: XI ROBOTIC ASSISTED BILATERAL SALPINGO OOPHORECTOMY, STAGING, LAPAROTOMY, PELVIC AND PARA AORTIC LYMPH NODE DISSECTION, OMENTECTOMY;  Surgeon: Isabel Caprice, MD;  Location: [facility];  Service: Gynecology;  Laterality: Bilateral;  . ROTATOR CUFF REPAIR  2005   right   . 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 Arthritis   . Diabetes mellitus without complication ([place])    type 2   . Difficulty sleeping   . GERD (gastroesophageal reflux disease)   . Hypertension   . Hypothyroidism   . Pneumonia    its been 4 years   . PONV (postoperative nausea and vomiting)   . Vitamin B 12 deficiency     Past Gynecological History: 2 prior to hysterectomy in 1984 received estrogen therapy from 1984 until 2000 No LMP recorded. Patient has had a hysterectomy.  Family Hx:  Family History  Problem Relation Age of Onset  . Lymphoma Mother   . Bladder Cancer Sister   . Prostate cancer Brother   . Leukemia Brother   . Breast cancer Other 12       sister's daughter     Vitals:  Blood pressure 111/62, pulse 68, temperature 98.5 F (36.9 C), temperature source Oral, resp. rate 20, height 5' 4.5" (1.638  m), weight 181 lb 3.2 oz (82.2 kg), SpO2 98 %.  Physical Exam:   ECOG PERFORMANCE STATUS: 1 - Symptomatic but completely ambulatory   General :  Well developed, 77 y.o., female in no apparent distress HEENT:  Normocephalic/atraumatic, symmetric, EOMI, eyelids normal Neck:   No visible masses.  Respiratory:  Respirations unlabored, no use of accessory muscles CV:   Deferred Breast:  Deferred Musculoskeletal: Normal muscle strength. Abdomen:  Wound is CDI, abdomen is soft, NT Extremities:  No visible edema, nontender Skin:   Normal inspection Neuro/Psych:  No focal motor deficit, no abnormal mental status. Normal gait. Normal affect. Alert and oriented to person, place, and time  Pelvic deferred   ASSESSMENT Ovarian cancer  PLAN 1. We reviewed her histology and stage. She was given copies of these reports. 2. She was given copies and we reviewed her imaging reports, specifically the finding of the renal lesion and ordering of followup with MRI. 3. We reviewed the 74m pulmonary nodule and recommendation for followup imaging TBD. 4. I provided reassurance about her bowel function not completely returning to normal. 5. For the question of fever, although not noted today, I think sending her urine is reasonable. 6. Prognosis and treatment discussion have been addressed in the hospital in the postoperative setting however she continues to have questions and so we spent quite a bit of time today discussing both of these items and the specifics of chemotherapy. 7. We will be referring her onto medical oncology for treatment discussion and initiation. 8. We briefly discussed placement of a port and I will defer those recommendations to Dr. GAlvy Bimler  9. Return to clinic in approximately 1 month to follow-up on a vaginal cuff check and see how she is  coming along with her chemotherapy regimen.  SIsabel Caprice MD 02/13/2018, 11:11 AM   Cc: RMolli Posey MD Referring OB/Gyn GGilford Rile MD AHarmon DunPCP

## 2018-02-14 ENCOUNTER — Telehealth: Payer: Self-pay | Admitting: Gynecologic Oncology

## 2018-02-14 ENCOUNTER — Other Ambulatory Visit: Payer: Self-pay | Admitting: Gynecologic Oncology

## 2018-02-14 ENCOUNTER — Inpatient Hospital Stay: Payer: Medicare HMO | Attending: Gynecologic Oncology

## 2018-02-14 ENCOUNTER — Ambulatory Visit (HOSPITAL_COMMUNITY)
Admission: RE | Admit: 2018-02-14 | Discharge: 2018-02-14 | Disposition: A | Discharge status: Home / Self Care | Payer: Medicare HMO | Source: Ambulatory Visit | Attending: Obstetrics | Admitting: Obstetrics

## 2018-02-14 DIAGNOSIS — Z7689 Persons encountering health services in other specified circumstances: Secondary | ICD-10-CM | POA: Insufficient documentation

## 2018-02-14 DIAGNOSIS — C562 Malignant neoplasm of left ovary: Secondary | ICD-10-CM | POA: Insufficient documentation

## 2018-02-14 DIAGNOSIS — Z79899 Other long term (current) drug therapy: Secondary | ICD-10-CM | POA: Diagnosis not present

## 2018-02-14 DIAGNOSIS — N281 Cyst of kidney, acquired: Secondary | ICD-10-CM | POA: Insufficient documentation

## 2018-02-14 DIAGNOSIS — N2889 Other specified disorders of kidney and ureter: Secondary | ICD-10-CM

## 2018-02-14 DIAGNOSIS — Z5111 Encounter for antineoplastic chemotherapy: Secondary | ICD-10-CM | POA: Diagnosis not present

## 2018-02-14 DIAGNOSIS — E538 Deficiency of other specified B group vitamins: Secondary | ICD-10-CM | POA: Insufficient documentation

## 2018-02-14 DIAGNOSIS — E039 Hypothyroidism, unspecified: Secondary | ICD-10-CM | POA: Insufficient documentation

## 2018-02-14 DIAGNOSIS — R531 Weakness: Secondary | ICD-10-CM

## 2018-02-14 DIAGNOSIS — Z8042 Family history of malignant neoplasm of prostate: Secondary | ICD-10-CM | POA: Diagnosis not present

## 2018-02-14 DIAGNOSIS — I10 Essential (primary) hypertension: Secondary | ICD-10-CM | POA: Insufficient documentation

## 2018-02-14 DIAGNOSIS — I700 Atherosclerosis of aorta: Secondary | ICD-10-CM | POA: Insufficient documentation

## 2018-02-14 DIAGNOSIS — M129 Arthropathy, unspecified: Secondary | ICD-10-CM | POA: Diagnosis not present

## 2018-02-14 DIAGNOSIS — E1140 Type 2 diabetes mellitus with diabetic neuropathy, unspecified: Secondary | ICD-10-CM | POA: Diagnosis not present

## 2018-02-14 DIAGNOSIS — Z8052 Family history of malignant neoplasm of bladder: Secondary | ICD-10-CM | POA: Insufficient documentation

## 2018-02-14 DIAGNOSIS — E119 Type 2 diabetes mellitus without complications: Secondary | ICD-10-CM | POA: Insufficient documentation

## 2018-02-14 DIAGNOSIS — R42 Dizziness and giddiness: Secondary | ICD-10-CM | POA: Insufficient documentation

## 2018-02-14 DIAGNOSIS — Z803 Family history of malignant neoplasm of breast: Secondary | ICD-10-CM | POA: Diagnosis not present

## 2018-02-14 DIAGNOSIS — Z806 Family history of leukemia: Secondary | ICD-10-CM | POA: Diagnosis not present

## 2018-02-14 DIAGNOSIS — K219 Gastro-esophageal reflux disease without esophagitis: Secondary | ICD-10-CM | POA: Insufficient documentation

## 2018-02-14 LAB — COMPREHENSIVE METABOLIC PANEL
ALT: 15 U/L (ref 0–55)
ANION GAP: 11 (ref 3–11)
AST: 13 U/L (ref 5–34)
Albumin: 3.5 g/dL (ref 3.5–5.0)
Alkaline Phosphatase: 101 U/L (ref 40–150)
BILIRUBIN TOTAL: 0.4 mg/dL (ref 0.2–1.2)
BUN: 9 mg/dL (ref 7–26)
CO2: 26 mmol/L (ref 22–29)
Calcium: 10.1 mg/dL (ref 8.4–10.4)
Chloride: 103 mmol/L (ref 98–109)
Creatinine, Ser: 0.73 mg/dL (ref 0.60–1.10)
GFR calc Af Amer: >60 mL/min (ref >60)
GFR calc non Af Amer: >60 mL/min (ref >60)
GLUCOSE: 104 mg/dL (ref 70–140)
POTASSIUM: 4.1 mmol/L (ref 3.5–5.1)
Sodium: 140 mmol/L (ref 136–145)
TOTAL PROTEIN: 6.9 g/dL (ref 6.4–8.3)

## 2018-02-14 LAB — CBC
HCT: 36.1 % (ref 34.8–46.6)
Hemoglobin: 12 g/dL (ref 11.6–15.9)
MCH: 29.4 pg (ref 25.1–34.0)
MCHC: 33.3 g/dL (ref 31.5–36.0)
MCV: 88.3 fL (ref 79.5–101.0)
Platelets: 425 10*3/uL — ABNORMAL HIGH (ref 145–400)
RBC: 4.09 MIL/uL (ref 3.70–5.45)
RDW: 15 % — ABNORMAL HIGH (ref 11.2–14.5)
WBC: 8.8 10*3/uL (ref 3.9–10.3)

## 2018-02-14 MED ORDER — GADOBENATE DIMEGLUMINE 529 MG/ML IV SOLN
20.0000 mL | Freq: Once | INTRAVENOUS | Status: AC | PRN
  Administered 2018-02-14: 17 mL via INTRAVENOUS

## 2018-02-14 NOTE — Telephone Encounter (Signed)
Spoke with patient's daughter.  Advised her to Cmet results.  No concerns voiced.  Advised they would be contacted when MRI results were available.  Advised to call for any needs.

## 2018-02-14 NOTE — Progress Notes (Signed)
Let her know the MRI showed the lesion near the kidney is a benign cyst

## 2018-02-14 NOTE — Telephone Encounter (Signed)
Patient informed of MRI results.  No concerns voiced.  Advised to call for any needs

## 2018-02-14 NOTE — Progress Notes (Signed)
Patient called this am reporting significant weakness.  Stating she can barely make it to the bathroom without feeling wiped out.  She also reports bleeding after giving lovenox injection and byetta this am.  Her blood sugar only got as high as 134 yesterday.  She is to come in for MRI today at 28.  Lab appt made at the [facility] for 11 am for stat CBC and Cmet.  Patient agreeable with the plan.  No concerns voiced.

## 2018-02-18 ENCOUNTER — Encounter: Payer: Self-pay | Admitting: Hematology and Oncology

## 2018-02-18 ENCOUNTER — Encounter: Payer: Self-pay | Admitting: Oncology

## 2018-02-18 ENCOUNTER — Inpatient Hospital Stay: Payer: Medicare HMO | Admitting: Hematology and Oncology

## 2018-02-18 ENCOUNTER — Telehealth: Payer: Self-pay | Admitting: Hematology and Oncology

## 2018-02-18 VITALS — BP 115/59 | HR 76 | Temp 98.0°F | Resp 18 | Ht 64.5 in | Wt 176.2 lb

## 2018-02-18 DIAGNOSIS — I700 Atherosclerosis of aorta: Secondary | ICD-10-CM

## 2018-02-18 DIAGNOSIS — Z79899 Other long term (current) drug therapy: Secondary | ICD-10-CM

## 2018-02-18 DIAGNOSIS — E1140 Type 2 diabetes mellitus with diabetic neuropathy, unspecified: Secondary | ICD-10-CM

## 2018-02-18 DIAGNOSIS — E1151 Type 2 diabetes mellitus with diabetic peripheral angiopathy without gangrene: Secondary | ICD-10-CM

## 2018-02-18 DIAGNOSIS — R42 Dizziness and giddiness: Secondary | ICD-10-CM

## 2018-02-18 DIAGNOSIS — E538 Deficiency of other specified B group vitamins: Secondary | ICD-10-CM

## 2018-02-18 DIAGNOSIS — E039 Hypothyroidism, unspecified: Secondary | ICD-10-CM

## 2018-02-18 DIAGNOSIS — M129 Arthropathy, unspecified: Secondary | ICD-10-CM | POA: Diagnosis not present

## 2018-02-18 DIAGNOSIS — K219 Gastro-esophageal reflux disease without esophagitis: Secondary | ICD-10-CM

## 2018-02-18 DIAGNOSIS — I10 Essential (primary) hypertension: Secondary | ICD-10-CM

## 2018-02-18 DIAGNOSIS — Z8052 Family history of malignant neoplasm of bladder: Secondary | ICD-10-CM

## 2018-02-18 DIAGNOSIS — Z803 Family history of malignant neoplasm of breast: Secondary | ICD-10-CM

## 2018-02-18 DIAGNOSIS — C562 Malignant neoplasm of left ovary: Secondary | ICD-10-CM

## 2018-02-18 DIAGNOSIS — Z5111 Encounter for antineoplastic chemotherapy: Secondary | ICD-10-CM

## 2018-02-18 DIAGNOSIS — Z8042 Family history of malignant neoplasm of prostate: Secondary | ICD-10-CM

## 2018-02-18 DIAGNOSIS — Z806 Family history of leukemia: Secondary | ICD-10-CM

## 2018-02-18 DIAGNOSIS — E119 Type 2 diabetes mellitus without complications: Secondary | ICD-10-CM

## 2018-02-18 NOTE — Assessment & Plan Note (Addendum)
We discussed briefly the role of adjuvant treatment with carboplatin and Taxol I will schedule chemo education class, port placement and will see her back before the start date of treatment for consents She will need G-CSF support I have given her prescription for cranial prosthesis I recommend genetic counseling referral and she agreed to proceed

## 2018-02-18 NOTE — Progress Notes (Signed)
[place] CONSULT NOTE  Patient Care Team: Raina Mina., MD as PCP - General (Internal Medicine)  ASSESSMENT & PLAN:  Malignant neoplasm of left ovary [facility]) We discussed briefly the role of adjuvant treatment with carboplatin and Taxol I will schedule chemo education class, port placement and will see her back before the start date of treatment for consents She will need G-CSF support I have given her prescription for cranial prosthesis I recommend genetic counseling referral and she agreed to proceed  Essential hypertension The patient has complained of dizziness likely due to recent weight loss, dehydration and drug-induced hypotension I recommend discontinuation of hydrochlorothiazide along with the potassium replacement therapy I also recommend consideration to space out her prescription amlodipine and Cozaar I will reassess in her next visit  Diabetes mellitus with peripheral circulatory disorder ([place]) She has poor oral intake I am concerned about risk of hypoglycemia I recommend reducing the dose of metformin to 500 mg twice a day for the next 2 weeks until her oral intake improved before she resume back to her normal dose   Orders Placed This Encounter  Procedures  . CBC with Differential (Cancer Center Only)    Standing Status:   Standing    Number of Occurrences:   20    Standing Expiration Date:   02/19/2019  . CMP ([facility] only)    Standing Status:   Standing    Number of Occurrences:   20    Standing Expiration Date:   02/19/2019  . Ambulatory referral to Genetics    Referral Priority:   Routine    Referral Type:   Consultation    Referral Reason:   Specialty Services Required    Number of Visits Requested:   1     CHIEF COMPLAINTS/PURPOSE OF CONSULTATION:  Ovarian cancer, for adjuvant treatment  HISTORY OF PRESENTING ILLNESS:  [name] 77 y.o. female is here because of recent diagnosis of ovarian cancer. The patient has a son and  her daughter.  Her daughter, Santiago Glad is present She has an interesting history of chronic back pain and hip pain for some time. She was seeing an orthopedic physician for evaluation and treatment management.  Due to her progressive pain, she subsequently underwent imaging study and was noted to have pelvic mass.  She underwent further evaluation and surgery and was diagnosed with ovarian cancer I have reviewed her chart and materials related to her cancer extensively and collaborated history with the patient. Summary of oncologic history is as follows:   Malignant neoplasm of left ovary ([place])   01/31/2018 Pathology Results    1. 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 - TUMOR INVOLVES OVARY SURFACE AND LEFT FALLOPIAN TUBE. - SEE ONCOLOGY TABLE. 2. Mesentery, small bowel mesentery biopsy #1 - BENIGN FIBROADIPOSE TISSUE. 3. Ovary and fallopian tube, right - BENIGN OVARY WITH INCLUSION CYSTS. - BENIGN FALLOPIAN TUBE WITH PARATUBAL CYSTS AND ADENOFIBROMA. 4. Omentum, resection for tumor - BENIGN ADIPOSE TISSUE. 5. Peritoneum, biopsy, left diaphragmatic - BENIGN PERITONEAL TYPE TISSUE. 6. Peritoneum, biopsy, right diaphragmatic - BENIGN PERITONEAL TYPE TISSUE. 7. Mesentery, small bowel mesenteric biopsy #2 - BENIGN FIBROADIPOSE TISSUE. 8. Lymph nodes, regional resection, right pelvic - FIVE OF FIVE LYMPH NODES NEGATIVE FOR CARCINOMA (0/5). 9. Lymph nodes, regional resection, left pelvic - FOUR OF FOUR LYMPH NODES NEGATIVE FOR CARCINOMA (0/4). 10. Peritoneum, biopsy, left gutter - BENIGN PERITONEAL TYPE TISSUE. 11. Peritoneum, biopsy, bladder -  BENIGN PERITONEAL TYPE TISSUE WITH ACUTE INFLAMMATION AND CALCIFICATIONS.  12. Peritoneum, biopsy, cul-de-sac - BENIGN PERITONEAL TYPE TISSUE WITH ACUTE INFLAMMATION AND CALCIFICATIONS. 13. Soft tissue, biopsy, right gutter - BENIGN FIBROADIPOSE TISSUE. 14. Lymph node, biopsy, right  para-aortic - TWO OF TWO LYMPH NODES NEGATIVE FOR CARCINOMA (0/2). 15. Lymph node, biopsy, left para-aortic - ONE OF ONE LYMPH NODES NEGATIVE FOR CARCINOMA (0/1). Microscopic Comment 1. OVARY Specimen(s): Left ovary and fallopian tube. Procedure: (including lymph node sampling): Bilateral salpingo-oophorectomy with omental and peritoneal biopsies and lymph node biopsies. Primary tumor site (including laterality): Left ovary. Ovarian surface involvement: Present. Ovarian capsule intact without fragmentation: Intact. Maximum tumor size (cm): 11 cm total. Histologic type: Serous cystadenocarcinoma. Grade: High grade (low grade areas also present). Peritoneal implants: (specify invasive or non-invasive): N/A. Pelvic extension (list additional structures on separate lines and if involved): Left fallopian tube. Lymph nodes: number examined 12 ; number positive 0 TNM code: pT2a, pN0, pMX FIGO Stage (based on pathologic findings, needs clinical correlation): IIA Comments: None.      01/31/2018 Pathology Results    PERITONEAL WASHING (SPECIMEN 1 OF 1 COLLECTED 01/31/18): MALIGNANT CELLS PRESENT CONSISTENT WITH METASTATIC CARCINOMA.      01/31/2018 Surgery    Procedure(s) Performed:  1. Robotic BSO and washings. 2. Exploratory laparotomy, Staging including infragastric omentectomy, Bilateral pelvic and paraaortic lymphadenectomy, pertioneal biopsies.  Specimens: Bilateral tubes / ovaries, bilateral pelvic and paraaortic lymph nodes, peritoneal biopsies, washings and omentum.  Operative Findings: The left adnexa was adherent to the left pelvic peritoneum suspected due to the inferior retraction from the vaginal hysterectomy. Intraoperative leakage of cystic fluid from left ovary.  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  The right adnexa grossly appeared normal although slightly enlarged for her age.  There were some indurated lymph nodes but no  overtly malignant lymph nodes were suspected.  Small bowel mesentery had 3-4 superficial possible implants.  1 of these was sent for frozen section returned benign.  She did have adhesive disease from her open cholecystectomy in the right upper quadrant.  No other evidence of disease in the abdomen or pelvis on palpation; specifically including the small bowel stomach and large bowel       02/05/2018 Imaging    CT scan of abdomen 1. 16 mm exophytic lesion upper pole right kidney has attenuation too high to be a simple cyst. This may be a cyst complicated by proteinaceous debris or hemorrhage, but renal cell carcinoma is a concern. Routine outpatient follow-up MRI of the abdomen without and with contrast recommended after resolution of patient's acute symptoms (so she is better able to participate with positioning and breath holding). 2. Mild edema/possible minimal hemorrhage in the extraperitoneal soft tissues of the pelvic for tracking up both pelvic sidewalls. Imaging appearance is not outside of the spectrum of findings expected 6 days after the reported surgery. Trace interloop mesenteric and free fluid is identified in the peritoneal cavity in there is some mild edema in the omentum. There is no organized or rim enhancing collection to suggest evolving abscess. 3. Gas in the extraperitoneal soft tissues of the left lower quadrant is associated with gas in the subcutaneous fat of the lower left anterior abdominal wall. This is not unexpected on postoperative day 6. No evidence for intraperitoneal free air. 4. No CT features to suggest small bowel obstruction. Oral contrast material has migrated about halfway through the small bowel loops but there is no differential distention of  proximal versus distal small bowel. The colon is diffusely distended and fluid-filled proximally, but formed stool is noted in the distal colon. Given apparent slow migration of contrast and fluid-filled small and large bowel  loops, a component of ileus is possible.      02/14/2018 Imaging    MR abdomen Benign Bosniak category 2 cyst in upper pole of right kidney, corresponding with lesion seen on previous CT. No evidence of renal neoplasm or other significant abnormality.       02/18/2018 Cancer Staging    Staging form: Ovary, Fallopian Tube, and Primary Peritoneal Carcinoma, AJCC 8th Edition - Pathologic: Stage II (pT2, pN0, cM0) - Signed by Heath Lark, MD on 02/18/2018      Since surgery, she is slowly improving.  She complained of nausea and changes in bowel habits.  She had postoperative complication requiring hospitalization but subsequently improved Her bowel habits is stable but she continues to have mild constipation and frequent belching.  She estimated she has lost 5 to 10 pounds Her hip pain had resolved She continues to have abnormal gait but no recent falls or weakness She complained of postural dizziness and erratic eating habits.  Her blood sugar was stable but somewhat on the low side She denies baseline peripheral neuropathy from diabetes She had minor bruising from prior surgery  MEDICAL HISTORY:  Past Medical History:  Diagnosis Date  . Arthritis   . Diabetes mellitus without complication ([place])    type 2   . Difficulty sleeping   . GERD (gastroesophageal reflux disease)   . Hypertension   . Hypothyroidism   . Pneumonia    its been 4 years   . PONV (postoperative nausea and vomiting)   . Vitamin B 12 deficiency     SURGICAL HISTORY: Past Surgical History:  Procedure Laterality Date  . BLADDER SUSPENSION  2002  . BREAST SURGERY     several benign cysts removed; surgeries from [place]   . CHOLECYSTECTOMY  1988  . EYE SURGERY  2010 amd 2012   cataracts removed bilateral   . KNEE ARTHROSCOPY Left    l knee x2  . PARATHYROIDECTOMY  2010  . ROBOTIC ASSISTED SALPINGO OOPHERECTOMY Bilateral 01/31/2018   Procedure: XI ROBOTIC ASSISTED BILATERAL SALPINGO OOPHORECTOMY, STAGING,  LAPAROTOMY, PELVIC AND PARA AORTIC LYMPH NODE DISSECTION, OMENTECTOMY;  Surgeon: Isabel Caprice, MD;  Location: [facility];  Service: Gynecology;  Laterality: Bilateral;  . ROTATOR CUFF REPAIR  2005   right   . TOTAL KNEE ARTHROPLASTY Left 12/08/2014   Procedure: LEFT TOTAL KNEE ARTHROPLASTY;  Surgeon: Tobi Bastos, MD;  Location: [facility];  Service: Orthopedics;  Laterality: Left;  Marland Kitchen VAGINAL HYSTERECTOMY  1984    SOCIAL HISTORY: Social History   Socioeconomic History  . Marital status: Widowed    Spouse name: Not on file  . Number of children: 2  . Years of education: Not on file  . Highest education level: Not on file  Occupational History  . Occupation: retired Glass blower/designer  Social Needs  . Financial resource strain: Not on file  . Food insecurity:    Worry: Not on file    Inability: Not on file  . Transportation needs:    Medical: Not on file    Non-medical: Not on file  Tobacco Use  . Smoking status: Never Smoker  . Smokeless tobacco: Never Used  Substance and Sexual Activity  . Alcohol use: No  . Drug use: No  . Sexual activity: Not on  file  Lifestyle  . Physical activity:    Days per week: Not on file    Minutes per session: Not on file  . Stress: Not on file  Relationships  . Social connections:    Talks on phone: Not on file    Gets together: Not on file    Attends religious service: Not on file    Active member of club or organization: Not on file    Attends meetings of clubs or organizations: Not on file    Relationship status: Not on file  . Intimate partner violence:    Fear of current or ex partner: Not on file    Emotionally abused: Not on file    Physically abused: Not on file    Forced sexual activity: Not on file  Other Topics Concern  . Not on file  Social History Narrative  . Not on file    FAMILY HISTORY: Family History  Problem Relation Age of Onset  . Lymphoma Mother   . Bladder Cancer Sister   . Prostate cancer Brother   .  Leukemia Brother   . Breast cancer Other 22       niece    ALLERGIES:  is allergic to penicillins.  MEDICATIONS:  Current Outpatient Medications  Medication Sig Dispense Refill  . ALPRAZolam (XANAX) 0.5 MG tablet Take 0.5 mg by mouth at bedtime.   5  . amLODipine (NORVASC) 5 MG tablet Take 5 mg by mouth every morning.    Marland Kitchen atorvastatin (LIPITOR) 20 MG tablet Take 20 mg by mouth every evening.     . Cholecalciferol (VITAMIN D) 2000 units tablet Take 2,000 Units by mouth daily.    . Cyanocobalamin (B-12 COMPLIANCE INJECTION) 1000 MCG/ML KIT Inject 1,000 mcg as directed every 30 (thirty) days.    Marland Kitchen docusate sodium (COLACE) 100 MG capsule Take 200 mg by mouth daily as needed (severe constipation).    . enoxaparin (LOVENOX) 40 MG/0.4ML injection Inject 0.4 mLs (40 mg total) into the skin daily for 26 days. 26 Syringe 0  . exenatide (BYETTA) 10 MCG/0.04ML SOPN injection Inject 10 mcg into the skin 2 (two) times daily.     . feeding supplement, GLUCERNA SHAKE, (GLUCERNA SHAKE) LIQD Take 237 mLs by mouth daily.    Marland Kitchen FLUoxetine (PROZAC) 20 MG capsule Take 20 mg by mouth every morning.    . hydrochlorothiazide (HYDRODIURIL) 25 MG tablet Take 25 mg by mouth every morning.    Marland Kitchen levothyroxine (SYNTHROID, LEVOTHROID) 100 MCG tablet Take 100 mcg by mouth daily before breakfast.    . losartan (COZAAR) 100 MG tablet Take 100 mg by mouth every morning.    . Melatonin 5 MG TABS Take 5-10 mg by mouth at bedtime.    . metFORMIN (GLUCOPHAGE-XR) 500 MG 24 hr tablet Take 1,000 mg by mouth 2 (two) times daily.    Marland Kitchen omeprazole (PRILOSEC OTC) 20 MG tablet Take 20 mg by mouth daily.    . ondansetron (ZOFRAN) 4 MG tablet Take 1 tablet (4 mg total) by mouth every 8 (eight) hours as needed for nausea or vomiting. 20 tablet 0  . oxyCODONE (OXY IR/ROXICODONE) 5 MG immediate release tablet Take 1 tablet (5 mg total) by mouth every 4 (four) hours as needed for moderate pain. 30 tablet 0  . potassium chloride  (K-DUR,KLOR-CON) 10 MEQ tablet Take 10 mEq by mouth every evening.     . SENNA PO Take 3 tablets by mouth daily as needed (severe constipation).    Marland Kitchen  vitamin E 400 UNIT capsule Take 400 Units by mouth daily.     No current facility-administered medications for this visit.     REVIEW OF SYSTEMS:   Constitutional: Denies fevers, chills or abnormal night sweats Eyes: Denies blurriness of vision, double vision or watery eyes Ears, nose, mouth, throat, and face: Denies mucositis or sore throat Respiratory: Denies cough, dyspnea or wheezes Cardiovascular: Denies palpitation, chest discomfort or lower extremity swelling Skin: Denies abnormal skin rashes Lymphatics: Denies new lymphadenopathy Neurological:Denies numbness, tingling or new weaknesses Behavioral/Psych: Mood is stable, no new changes  All other systems were reviewed with the patient and are negative.  PHYSICAL EXAMINATION: ECOG PERFORMANCE STATUS: 1 - Symptomatic but completely ambulatory  Vitals:   02/18/18 1017  BP: (!) 115/59  Pulse: 76  Resp: 18  Temp: 98 F (36.7 C)  SpO2: 100%   Filed Weights   02/18/18 1017  Weight: 176 lb 3.2 oz (79.9 kg)    GENERAL:alert, no distress and comfortable SKIN: skin color, texture, turgor are normal, no rashes or significant lesions.  Minor bruising is noted EYES: normal, conjunctiva are pink and non-injected, sclera clear OROPHARYNX:no exudate, no erythema and lips, buccal mucosa, and tongue normal  NECK: supple, thyroid normal size, non-tender, without nodularity LYMPH:  no palpable lymphadenopathy in the cervical, axillary or inguinal LUNGS: clear to auscultation and percussion with normal breathing effort HEART: regular rate & rhythm and no murmurs and no lower extremity edema ABDOMEN:abdomen soft, non-tender and normal bowel sounds.  Well-healed surgical scar Musculoskeletal:no cyanosis of digits and no clubbing  PSYCH: alert & oriented x 3 with fluent speech NEURO: no  focal motor/sensory deficits  LABORATORY DATA:  I have reviewed the data as listed Lab Results  Component Value Date   WBC 8.8 02/14/2018   HGB 12.0 02/14/2018   HCT 36.1 02/14/2018   MCV 88.3 02/14/2018   PLT 425 (H) 02/14/2018   Recent Labs    02/05/18 1218 02/06/18 0556 02/07/18 0523 02/08/18 0529 02/14/18 1054  NA 138 136  --   --  140  K 4.4 3.6  --   --  4.1  CL 101 102  --   --  103  CO2 25 24  --   --  26  GLUCOSE 125* 143*  --   --  104  BUN 12 8  --   --  9  CREATININE 0.57 0.61  --   --  0.73  CALCIUM 9.4 8.5*  --   --  10.1  GFRNONAA >60 >60  --   --  >60  GFRAA >60 >60  --   --  >60  PROT 6.2* 5.8* 5.4* 5.6* 6.9  ALBUMIN 3.3* 2.8* 2.7* 2.6* 3.5  AST 46* 222* 72* 21 13  ALT 48 163* 107* 63* 15  ALKPHOS 131* 224* 196* 157* 101  BILITOT 1.3* 1.2 0.8 0.7 0.4  BILIDIR  --   --  0.2 0.2  --   IBILI  --   --  0.6 0.5  --     RADIOGRAPHIC STUDIES: I have personally reviewed the radiological images as listed and agreed with the findings in the report. Ct Chest W Contrast  Result Date: 02/06/2018 CLINICAL DATA:  Status post Exploratory laparotomy, Staging including infragastric omentectomy, Bilateral pelvic and paraaortic lymphadenectomy, pertioneal biopsies on 01/31/2018. Now with postoperative nausea and vomiting and abdominal pain. EXAM: CT CHEST, ABDOMEN, AND PELVIS WITH CONTRAST TECHNIQUE: Multidetector CT imaging of the chest, abdomen and pelvis was  performed following the standard protocol during bolus administration of intravenous contrast. CONTRAST:  160m OMNIPAQUE IOHEXOL 300 MG/ML  SOLN COMPARISON:  CT chest 07/11/2007. FINDINGS: CT CHEST FINDINGS Cardiovascular: The heart size is normal. No pericardial effusion. Coronary artery calcification is evident. Atherosclerotic calcification is noted in the wall of the thoracic aorta. Mediastinum/Nodes: No mediastinal lymphadenopathy. There is no hilar lymphadenopathy. Tiny hiatal hernia. Contrast material in the  lumen of the esophagus may be related to dysmotility or reflux. There is no axillary lymphadenopathy. Lungs/Pleura: 7 mm right upper lobe pulmonary nodule identified image 50/series 4. Bronchiectasis and scarring noted in the right middle lobe. Calcified granuloma identified anterior right lower lobe. No focal airspace consolidation. No pulmonary edema or pleural effusion. Musculoskeletal: Bone windows reveal no worrisome lytic or sclerotic osseous lesions. CT ABDOMEN PELVIS FINDINGS Hepatobiliary: No focal abnormality within the liver parenchyma. Gallbladder surgically absent. No intrahepatic or extrahepatic biliary dilation. Pancreas: No focal mass lesion. No dilatation of the main duct. No intraparenchymal cyst. No peripancreatic edema. Spleen: No splenomegaly. No focal mass lesion. Adrenals/Urinary Tract: No adrenal nodule or mass. 16 mm exophytic lesion upper pole right kidney has attenuation too high to be a cyst. Left kidney unremarkable. No evidence for hydroureter. Bladder is decompressed. Gas in the bladder lumen presumably related to recent instrumentation. Stomach/Bowel: Tiny hiatal hernia. Stomach otherwise unremarkable. Duodenum is normally positioned as is the ligament of Treitz. No small bowel wall thickening. No small bowel dilatation. Contrast material has migrated through to the level of the mid-distal small bowel. Distal ileum is un opacified, but fluid in gas is visible in the distal and terminal segments of the ileum. Ileocecal valve unremarkable. The appendix is normal. Colon shows diffuse mild distention with fluid in the right and transverse segments and formed stool in the distal sigmoid colon and rectum. No colonic wall thickening. Vascular/Lymphatic: There is abdominal aortic atherosclerosis without aneurysm. Portal vein and superior mesenteric vein are patent. The splenic vein is patent. Celiac axis and SMA opacify as does the IMA. There is no gastrohepatic or hepatoduodenal ligament  lymphadenopathy. No intraperitoneal or retroperitoneal lymphadenopathy. Small lymph nodes are seen in the pelvic sidewall bilaterally. Small right anterior juxta diaphragmatic lymph nodes are seen (image 45/series 2). Reproductive: Uterus is surgically absent. There is no adnexal mass. Other: There is edema in the soft tissues of the pelvic floor tracking up the pelvic sidewalls bilaterally. Areas of omental edema are evident. Fat planes in the pelvic sidewalls are obscured bilaterally. There is trace fluid in the pelvis, adjacent to the inferior liver and in the intraperitoneal space. No organized or rim enhancing fluid collection identified to suggest evolving abscess. Soft tissue gas identified in the subcutaneous tissues of the lower left anterior abdominal wall and in the extraperitoneal fat of the anterior left pelvic sidewall. Musculoskeletal: Bone windows reveal no worrisome lytic or sclerotic osseous lesions. IMPRESSION: 1. 16 mm exophytic lesion upper pole right kidney has attenuation too high to be a simple cyst. This may be a cyst complicated by proteinaceous debris or hemorrhage, but renal cell carcinoma is a concern. Routine outpatient follow-up MRI of the abdomen without and with contrast recommended after resolution of patient's acute symptoms (so she is better able to participate with positioning and breath holding). 2. Mild edema/possible minimal hemorrhage in the extraperitoneal soft tissues of the pelvic for tracking up both pelvic sidewalls. Imaging appearance is not outside of the spectrum of findings expected 6 days after the reported surgery. Trace interloop mesenteric and free  fluid is identified in the peritoneal cavity in there is some mild edema in the omentum. There is no organized or rim enhancing collection to suggest evolving abscess. 3. Gas in the extraperitoneal soft tissues of the left lower quadrant is associated with gas in the subcutaneous fat of the lower left anterior  abdominal wall. This is not unexpected on postoperative day 6. No evidence for intraperitoneal free air. 4. No CT features to suggest small bowel obstruction. Oral contrast material has migrated about halfway through the small bowel loops but there is no differential distention of proximal versus distal small bowel. The colon is diffusely distended and fluid-filled proximally, but formed stool is noted in the distal colon. Given apparent slow migration of contrast and fluid-filled small and large bowel loops, a component of ileus is possible. Or Electronically Signed   By: Misty Stanley M.D.   On: 02/06/2018 07:35   Mr Abdomen Wwo Contrast  Result Date: 02/14/2018 CLINICAL DATA:  Indeterminate right renal lesion on recent CT. EXAM: MRI ABDOMEN WITHOUT AND WITH CONTRAST TECHNIQUE: Multiplanar multisequence MR imaging of the abdomen was performed both before and after the administration of intravenous contrast. CONTRAST:  50m MULTIHANCE GADOBENATE DIMEGLUMINE 529 MG/ML IV SOLN COMPARISON:  CT on 02/05/2018 FINDINGS: Lower chest: No acute findings. Hepatobiliary: No hepatic masses identified. Prior cholecystectomy. No evidence of biliary dilatation. Pancreas:  No mass or inflammatory changes. Spleen:  Within normal limits in size and appearance. Adrenals/Urinary Tract: Normal appearance of both adrenal glands. A 1.6 cm subcapsular lesion is seen in the upper pole the right kidney which shows T1 hyperintensity and lack of contrast enhancement or other complex features. This is consistent with a benign Bosniak category 2 cyst. No other cystic or solid lesions are seen involving either kidney. No evidence of hydronephrosis. Stomach/Bowel: Visualized portion unremarkable. Vascular/Lymphatic: No pathologically enlarged lymph nodes identified. No abdominal aortic aneurysm. Other:  None. Musculoskeletal:  No suspicious bone lesions identified. IMPRESSION: Benign Bosniak category 2 cyst in upper pole of right kidney,  corresponding with lesion seen on previous CT. No evidence of renal neoplasm or other significant abnormality. Electronically Signed   By: JEarle GellM.D.   On: 02/14/2018 14:35   Ct Abdomen Pelvis W Contrast  Result Date: 02/06/2018 CLINICAL DATA:  Status post Exploratory laparotomy, Staging including infragastric omentectomy, Bilateral pelvic and paraaortic lymphadenectomy, pertioneal biopsies on 01/31/2018. Now with postoperative nausea and vomiting and abdominal pain. EXAM: CT CHEST, ABDOMEN, AND PELVIS WITH CONTRAST TECHNIQUE: Multidetector CT imaging of the chest, abdomen and pelvis was performed following the standard protocol during bolus administration of intravenous contrast. CONTRAST:  108mOMNIPAQUE IOHEXOL 300 MG/ML  SOLN COMPARISON:  CT chest 07/11/2007. FINDINGS: CT CHEST FINDINGS Cardiovascular: The heart size is normal. No pericardial effusion. Coronary artery calcification is evident. Atherosclerotic calcification is noted in the wall of the thoracic aorta. Mediastinum/Nodes: No mediastinal lymphadenopathy. There is no hilar lymphadenopathy. Tiny hiatal hernia. Contrast material in the lumen of the esophagus may be related to dysmotility or reflux. There is no axillary lymphadenopathy. Lungs/Pleura: 7 mm right upper lobe pulmonary nodule identified image 50/series 4. Bronchiectasis and scarring noted in the right middle lobe. Calcified granuloma identified anterior right lower lobe. No focal airspace consolidation. No pulmonary edema or pleural effusion. Musculoskeletal: Bone windows reveal no worrisome lytic or sclerotic osseous lesions. CT ABDOMEN PELVIS FINDINGS Hepatobiliary: No focal abnormality within the liver parenchyma. Gallbladder surgically absent. No intrahepatic or extrahepatic biliary dilation. Pancreas: No focal mass lesion. No dilatation of  the main duct. No intraparenchymal cyst. No peripancreatic edema. Spleen: No splenomegaly. No focal mass lesion. Adrenals/Urinary Tract:  No adrenal nodule or mass. 16 mm exophytic lesion upper pole right kidney has attenuation too high to be a cyst. Left kidney unremarkable. No evidence for hydroureter. Bladder is decompressed. Gas in the bladder lumen presumably related to recent instrumentation. Stomach/Bowel: Tiny hiatal hernia. Stomach otherwise unremarkable. Duodenum is normally positioned as is the ligament of Treitz. No small bowel wall thickening. No small bowel dilatation. Contrast material has migrated through to the level of the mid-distal small bowel. Distal ileum is un opacified, but fluid in gas is visible in the distal and terminal segments of the ileum. Ileocecal valve unremarkable. The appendix is normal. Colon shows diffuse mild distention with fluid in the right and transverse segments and formed stool in the distal sigmoid colon and rectum. No colonic wall thickening. Vascular/Lymphatic: There is abdominal aortic atherosclerosis without aneurysm. Portal vein and superior mesenteric vein are patent. The splenic vein is patent. Celiac axis and SMA opacify as does the IMA. There is no gastrohepatic or hepatoduodenal ligament lymphadenopathy. No intraperitoneal or retroperitoneal lymphadenopathy. Small lymph nodes are seen in the pelvic sidewall bilaterally. Small right anterior juxta diaphragmatic lymph nodes are seen (image 45/series 2). Reproductive: Uterus is surgically absent. There is no adnexal mass. Other: There is edema in the soft tissues of the pelvic floor tracking up the pelvic sidewalls bilaterally. Areas of omental edema are evident. Fat planes in the pelvic sidewalls are obscured bilaterally. There is trace fluid in the pelvis, adjacent to the inferior liver and in the intraperitoneal space. No organized or rim enhancing fluid collection identified to suggest evolving abscess. Soft tissue gas identified in the subcutaneous tissues of the lower left anterior abdominal wall and in the extraperitoneal fat of the anterior  left pelvic sidewall. Musculoskeletal: Bone windows reveal no worrisome lytic or sclerotic osseous lesions. IMPRESSION: 1. 16 mm exophytic lesion upper pole right kidney has attenuation too high to be a simple cyst. This may be a cyst complicated by proteinaceous debris or hemorrhage, but renal cell carcinoma is a concern. Routine outpatient follow-up MRI of the abdomen without and with contrast recommended after resolution of patient's acute symptoms (so she is better able to participate with positioning and breath holding). 2. Mild edema/possible minimal hemorrhage in the extraperitoneal soft tissues of the pelvic for tracking up both pelvic sidewalls. Imaging appearance is not outside of the spectrum of findings expected 6 days after the reported surgery. Trace interloop mesenteric and free fluid is identified in the peritoneal cavity in there is some mild edema in the omentum. There is no organized or rim enhancing collection to suggest evolving abscess. 3. Gas in the extraperitoneal soft tissues of the left lower quadrant is associated with gas in the subcutaneous fat of the lower left anterior abdominal wall. This is not unexpected on postoperative day 6. No evidence for intraperitoneal free air. 4. No CT features to suggest small bowel obstruction. Oral contrast material has migrated about halfway through the small bowel loops but there is no differential distention of proximal versus distal small bowel. The colon is diffusely distended and fluid-filled proximally, but formed stool is noted in the distal colon. Given apparent slow migration of contrast and fluid-filled small and large bowel loops, a component of ileus is possible. Or Electronically Signed   By: Misty Stanley M.D.   On: 02/06/2018 07:35   Dg Abd 2 Views  Result Date: 02/07/2018 CLINICAL  DATA:  Abdominal pain postop EXAM: ABDOMEN - 2 VIEW COMPARISON:  02/05/2018 FINDINGS: Supine and decubitus views. No free air on decubitus imaging.  Contrast within the colon. Nonobstructed gas pattern. Surgical clips in the left lower quadrant. IMPRESSION: Nonobstructed gas pattern with contrast in the colon. Electronically Signed   By: Donavan Foil M.D.   On: 02/07/2018 16:49   Dg Abd 2 Views  Result Date: 02/04/2018 CLINICAL DATA:  Ovarian carcinoma with recent surgery EXAM: ABDOMEN - 2 VIEW COMPARISON:  None. FINDINGS: Supine and upright abdomen images obtained. There is no appreciable bowel dilatation. There are air-fluid levels in the right abdomen. There is fairly diffuse stool throughout the colon. No free air. Lung bases are clear. There is postoperative change with clips in the left pelvis. IMPRESSION: No bowel dilatation. There are, however, air-fluid levels in the right abdomen. Suspect a degree of ileus or enteritis. Bowel obstruction not felt to be likely. Note that there is stool and air in the rectum. There is fairly diffuse stool throughout the colon. Lung bases clear. Electronically Signed   By: Lowella Grip III M.D.   On: 02/04/2018 14:06    I spent 55 minutes counseling the patient face to face. The total time spent in the appointment was 60 minutes and more than 50% was on counseling.  All questions were answered. The patient knows to call the clinic with any problems, questions or concerns.  Heath Lark, MD 02/18/2018 1:13 PM

## 2018-02-18 NOTE — Telephone Encounter (Signed)
Gave patient AVs and calendar of upcoming may and June appointments.

## 2018-02-18 NOTE — Assessment & Plan Note (Signed)
The patient has complained of dizziness likely due to recent weight loss, dehydration and drug-induced hypotension I recommend discontinuation of hydrochlorothiazide along with the potassium replacement therapy I also recommend consideration to space out her prescription amlodipine and Cozaar I will reassess in her next visit

## 2018-02-18 NOTE — Assessment & Plan Note (Signed)
She has poor oral intake I am concerned about risk of hypoglycemia I recommend reducing the dose of metformin to 500 mg twice a day for the next 2 weeks until her oral intake improved before she resume back to her normal dose

## 2018-02-18 NOTE — Progress Notes (Signed)
START ON PATHWAY REGIMEN - Ovarian     A cycle is every 21 days:     Paclitaxel      Carboplatin   **Always confirm dose/schedule in your pharmacy ordering system**    Patient Characteristics: Newly Diagnosed, Adjuvant Therapy, Stage IIA Therapeutic Status: Newly Diagnosed AJCC T Category: T2 AJCC N Category: N0 AJCC M Category: M0 AJCC 8 Stage Grouping: II BRCA Mutation Status: Awaiting Test Results Intent of Therapy: Curative Intent, Discussed with Patient

## 2018-02-18 NOTE — Progress Notes (Signed)
  Oncology Nurse Navigator Documentation  Navigator Location: [facility] (02/18/18 1247)   )Navigator Encounter Type: Initial MedOnc (02/18/18 1247)     Confirmed Diagnosis Date: 01/31/18 (02/18/18 1247) Surgery Date: 01/31/18 (02/18/18 1247)             Patient Visit Type: MedOnc (02/18/18 1247) Treatment Phase: Pre-Tx/Tx Discussion (02/18/18 1247) Barriers/Navigation Needs: Education (02/18/18 1247) Education: Understanding Cancer/ Treatment Options;Preparing for Upcoming Surgery/ Treatment (02/18/18 1247) Interventions: Education (02/18/18 1247)     Education Method: Verbal;Written (02/18/18 1247)      Acuity: Level 2 (02/18/18 1247)   Acuity Level 2: Initial guidance, education and coordination as needed (02/18/18 1247)     Time Spent with Patient: 15 (02/18/18 1247)   Met with patient and daughter, Santiago Glad, during her first appointment with [name].  She was provided with handouts about patient navigation and the GYN support group. She was encouraged to call with any questions.

## 2018-02-21 ENCOUNTER — Other Ambulatory Visit: Payer: Self-pay | Admitting: Hematology and Oncology

## 2018-02-21 DIAGNOSIS — C562 Malignant neoplasm of left ovary: Secondary | ICD-10-CM

## 2018-02-26 ENCOUNTER — Encounter (HOSPITAL_COMMUNITY): Payer: Self-pay | Admitting: Gynecologic Oncology

## 2018-02-27 ENCOUNTER — Other Ambulatory Visit: Payer: Self-pay | Admitting: Student

## 2018-02-28 ENCOUNTER — Encounter (HOSPITAL_COMMUNITY): Payer: Self-pay

## 2018-02-28 ENCOUNTER — Other Ambulatory Visit: Payer: Self-pay | Admitting: Student

## 2018-02-28 ENCOUNTER — Ambulatory Visit (HOSPITAL_COMMUNITY)
Admission: RE | Admit: 2018-02-28 | Discharge: 2018-02-28 | Disposition: A | Discharge status: Home / Self Care | Payer: Medicare HMO | Source: Ambulatory Visit | Attending: Hematology and Oncology | Admitting: Hematology and Oncology

## 2018-02-28 ENCOUNTER — Other Ambulatory Visit: Payer: Self-pay | Admitting: Hematology and Oncology

## 2018-02-28 DIAGNOSIS — K219 Gastro-esophageal reflux disease without esophagitis: Secondary | ICD-10-CM | POA: Insufficient documentation

## 2018-02-28 DIAGNOSIS — Z880 Allergy status to penicillin: Secondary | ICD-10-CM | POA: Diagnosis not present

## 2018-02-28 DIAGNOSIS — Z7984 Long term (current) use of oral hypoglycemic drugs: Secondary | ICD-10-CM | POA: Diagnosis not present

## 2018-02-28 DIAGNOSIS — E538 Deficiency of other specified B group vitamins: Secondary | ICD-10-CM | POA: Diagnosis not present

## 2018-02-28 DIAGNOSIS — M1990 Unspecified osteoarthritis, unspecified site: Secondary | ICD-10-CM | POA: Diagnosis not present

## 2018-02-28 DIAGNOSIS — E119 Type 2 diabetes mellitus without complications: Secondary | ICD-10-CM | POA: Diagnosis not present

## 2018-02-28 DIAGNOSIS — E039 Hypothyroidism, unspecified: Secondary | ICD-10-CM | POA: Insufficient documentation

## 2018-02-28 DIAGNOSIS — C562 Malignant neoplasm of left ovary: Secondary | ICD-10-CM | POA: Diagnosis not present

## 2018-02-28 DIAGNOSIS — I10 Essential (primary) hypertension: Secondary | ICD-10-CM | POA: Diagnosis not present

## 2018-02-28 HISTORY — PX: IR US GUIDE VASC ACCESS RIGHT: IMG2390

## 2018-02-28 HISTORY — PX: IR FLUORO GUIDE PORT INSERTION RIGHT: IMG5741

## 2018-02-28 LAB — CBC
HCT: 40 % (ref 36.0–46.0)
HEMOGLOBIN: 12.7 g/dL (ref 12.0–15.0)
MCH: 29.3 pg (ref 26.0–34.0)
MCHC: 31.8 g/dL (ref 30.0–36.0)
MCV: 92.4 fL (ref 78.0–100.0)
PLATELETS: 265 10*3/uL (ref 150–400)
RBC: 4.33 MIL/uL (ref 3.87–5.11)
RDW: 15.2 % (ref 11.5–15.5)
WBC: 7.4 10*3/uL (ref 4.0–10.5)

## 2018-02-28 LAB — GLUCOSE, CAPILLARY: Glucose-Capillary: 115 mg/dL — ABNORMAL HIGH (ref 65–99)

## 2018-02-28 LAB — PROTIME-INR
INR: 0.98
PROTHROMBIN TIME: 12.9 s (ref 11.4–15.2)

## 2018-02-28 LAB — APTT: APTT: 27 s (ref 24–36)

## 2018-02-28 MED ORDER — HEPARIN SOD (PORK) LOCK FLUSH 100 UNIT/ML IV SOLN
INTRAVENOUS | Status: AC
  Filled 2018-02-28: qty 5

## 2018-02-28 MED ORDER — FENTANYL CITRATE (PF) 100 MCG/2ML IJ SOLN
INTRAMUSCULAR | Status: AC | PRN
  Administered 2018-02-28: 25 ug via INTRAVENOUS
  Administered 2018-02-28: 50 ug via INTRAVENOUS

## 2018-02-28 MED ORDER — MIDAZOLAM HCL 2 MG/2ML IJ SOLN
INTRAMUSCULAR | Status: AC | PRN
  Administered 2018-02-28: 0.5 mg via INTRAVENOUS
  Administered 2018-02-28: 1 mg via INTRAVENOUS

## 2018-02-28 MED ORDER — LIDOCAINE HCL 1 % IJ SOLN
INTRAMUSCULAR | Status: AC
  Filled 2018-02-28: qty 20

## 2018-02-28 MED ORDER — VANCOMYCIN HCL IN DEXTROSE 1-5 GM/200ML-% IV SOLN
INTRAVENOUS | Status: AC
  Filled 2018-02-28: qty 200

## 2018-02-28 MED ORDER — HEPARIN SOD (PORK) LOCK FLUSH 100 UNIT/ML IV SOLN
INTRAVENOUS | Status: AC | PRN
  Administered 2018-02-28: 500 [IU] via INTRAVENOUS

## 2018-02-28 MED ORDER — LIDOCAINE HCL 1 % IJ SOLN
INTRAMUSCULAR | Status: AC | PRN
  Administered 2018-02-28: 20 mL

## 2018-02-28 MED ORDER — SODIUM CHLORIDE 0.9 % IV SOLN
INTRAVENOUS | Status: DC
  Administered 2018-02-28: 11:00:00 via INTRAVENOUS

## 2018-02-28 MED ORDER — MIDAZOLAM HCL 2 MG/2ML IJ SOLN
INTRAMUSCULAR | Status: AC
  Filled 2018-02-28: qty 4

## 2018-02-28 MED ORDER — FENTANYL CITRATE (PF) 100 MCG/2ML IJ SOLN
INTRAMUSCULAR | Status: AC
  Filled 2018-02-28: qty 4

## 2018-02-28 MED ORDER — VANCOMYCIN HCL IN DEXTROSE 1-5 GM/200ML-% IV SOLN
1000.0000 mg | INTRAVENOUS | Status: AC
  Administered 2018-02-28: 1000 mg via INTRAVENOUS

## 2018-02-28 NOTE — Discharge Instructions (Signed)
Do not use numbing cream (emla) for two weeks due to skin glue on incision.  May remove bandages in 24 hours. Call [facility] for redness/drainage/swelling.       Moderate Conscious Sedation, Adult, Care After These instructions provide you with information about caring for yourself after your procedure. Your health care provider may also give you more specific instructions. Your treatment has been planned according to current medical practices, but problems sometimes occur. Call your health care provider if you have any problems or questions after your procedure. What can I expect after the procedure? After your procedure, it is common:  To feel sleepy for several hours.  To feel clumsy and have poor balance for several hours.  To have poor judgment for several hours.  To vomit if you eat too soon.  Follow these instructions at home: For at least 24 hours after the procedure:   Do not: ? Participate in activities where you could fall or become injured. ? Drive. ? Use heavy machinery. ? Drink alcohol. ? Take sleeping pills or medicines that cause drowsiness. ? Make important decisions or sign legal documents. ? Take care of children on your own.  Rest. Eating and drinking  Follow the diet recommended by your health care provider.  If you vomit: ? Drink water, juice, or soup when you can drink without vomiting. ? Make sure you have little or no nausea before eating solid foods. General instructions  Have a responsible adult stay with you until you are awake and alert.  Take over-the-counter and prescription medicines only as told by your health care provider.  If you smoke, do not smoke without supervision.  Keep all follow-up visits as told by your health care provider. This is important. Contact a health care provider if:  You keep feeling nauseous or you keep vomiting.  You feel light-headed.  You develop a rash.  You have a fever. Get help right  away if:  You have trouble breathing. This information is not intended to replace advice given to you by your health care provider. Make sure you discuss any questions you have with your health care provider. Document Released: 07/23/2013 Document Revised: 03/06/2016 Document Reviewed: 01/22/2016 Elsevier Interactive Patient Education  2018 [place] Insertion, Care After This sheet gives you information about how to care for yourself after your procedure. Your health care provider may also give you more specific instructions. If you have problems or questions, contact your health care provider. What can I expect after the procedure? After your procedure, it is common to have:  Discomfort at the port insertion site.  Bruising on the skin over the port. This should improve over 3-4 days.  Follow these instructions at home: [facility] care  After your port is placed, you will get a manufacturer's information card. The card has information about your port. Keep this card with you at all times.  Take care of the port as told by your health care provider. Ask your health care provider if you or a family member can get training for taking care of the port at home. A home health care nurse may also take care of the port.  Make sure to remember what type of port you have. Incision care  Follow instructions from your health care provider about how to take care of your port insertion site. Make sure you: ? Wash your hands with soap and water before you change your bandage (dressing). If soap and  water are not available, use hand sanitizer. ? Change your dressing as told by your health care provider. ? Leave stitches (sutures), skin glue, or adhesive strips in place. These skin closures may need to stay in place for 2 weeks or longer. If adhesive strip edges start to loosen and curl up, you may trim the loose edges. Do not remove adhesive strips completely unless your health care  provider tells you to do that.  Check your port insertion site every day for signs of infection. Check for: ? More redness, swelling, or pain. ? More fluid or blood. ? Warmth. ? Pus or a bad smell. General instructions  Do not take baths, swim, or use a hot tub until your health care provider approves.  Do not lift anything that is heavier than 10 lb (4.5 kg) for a week, or as told by your health care provider.  Ask your health care provider when it is okay to: ? Return to work or school. ? Resume usual physical activities or sports.  Do not drive for 24 hours if you were given a medicine to help you relax (sedative).  Take over-the-counter and prescription medicines only as told by your health care provider.  Wear a medical alert bracelet in case of an emergency. This will tell any health care providers that you have a port.  Keep all follow-up visits as told by your health care provider. This is important. Contact a health care provider if:  You cannot flush your port with saline as directed, or you cannot draw blood from the port.  You have a fever or chills.  You have more redness, swelling, or pain around your port insertion site.  You have more fluid or blood coming from your port insertion site.  Your port insertion site feels warm to the touch.  You have pus or a bad smell coming from the port insertion site. Get help right away if:  You have chest pain or shortness of breath.  You have bleeding from your port that you cannot control. Summary  Take care of the port as told by your health care provider.  Change your dressing as told by your health care provider.  Keep all follow-up visits as told by your health care provider. This information is not intended to replace advice given to you by your health care provider. Make sure you discuss any questions you have with your health care provider. Document Released: 07/23/2013 Document Revised: 08/23/2016 Document  Reviewed: 08/23/2016 Elsevier Interactive Patient Education  2017 [employer].

## 2018-02-28 NOTE — Consult Note (Signed)
Chief Complaint: Patient was seen in consultation today for Port-A-Cath placement  Referring Physician(s): [place]  Supervising Physician: Corrie Mckusick  Patient Status: [facility] - Out-pt  History of Present Illness: [name] is a 77 y.o. female with history of recently diagnosed left ovarian carcinoma, status post surgery, who presents today for Port-A-Cath placement for chemotherapy.  Past Medical History:  Diagnosis Date  . Arthritis   . Diabetes mellitus without complication ([place])    type 2   . Difficulty sleeping   . GERD (gastroesophageal reflux disease)   . Hypertension   . Hypothyroidism   . Pneumonia    its been 4 years   . PONV (postoperative nausea and vomiting)   . Vitamin B 12 deficiency     Past Surgical History:  Procedure Laterality Date  . BLADDER SUSPENSION  2002  . BREAST SURGERY     several benign cysts removed; surgeries from [facility]   . CHOLECYSTECTOMY  1988  . EYE SURGERY  2010 amd 2012   cataracts removed bilateral   . KNEE ARTHROSCOPY Left    l knee x2  . PARATHYROIDECTOMY  2010  . ROBOTIC ASSISTED SALPINGO OOPHERECTOMY Bilateral 01/31/2018   Procedure: XI ROBOTIC ASSISTED BILATERAL SALPINGO OOPHORECTOMY, STAGING, LAPAROTOMY, PELVIC AND PARA AORTIC LYMPH NODE DISSECTION, OMENTECTOMY;  Surgeon: Isabel Caprice, MD;  Location: [facility];  Service: Gynecology;  Laterality: Bilateral;  . ROTATOR CUFF REPAIR  2005   right   . TOTAL KNEE ARTHROPLASTY Left 12/08/2014   Procedure: LEFT TOTAL KNEE ARTHROPLASTY;  Surgeon: Tobi Bastos, MD;  Location: [facility];  Service: Orthopedics;  Laterality: Left;  Marland Kitchen VAGINAL HYSTERECTOMY  1984    Allergies: Penicillins  Medications: Prior to Admission medications   Medication Sig Start Date End Date Taking? Authorizing Provider  ALPRAZolam Duanne Moron) 0.5 MG tablet Take 0.5 mg by mouth at bedtime.  10/17/14   [provider]  amLODipine (NORVASC) 5 MG tablet Take 5 mg by mouth every morning.     [provider]  atorvastatin (LIPITOR) 20 MG tablet Take 20 mg by mouth every evening.     [provider]  Cholecalciferol (VITAMIN D) 2000 units tablet Take 2,000 Units by mouth daily.    [provider]  Cyanocobalamin (B-12 COMPLIANCE INJECTION) 1000 MCG/ML KIT Inject 1,000 mcg as directed every 30 (thirty) days.    [provider]  docusate sodium (COLACE) 100 MG capsule Take 200 mg by mouth daily as needed (severe constipation).    [provider]  enoxaparin (LOVENOX) 40 MG/0.4ML injection Inject 0.4 mLs (40 mg total) into the skin daily for 26 days. 02/02/18 02/28/18  Lahoma Crocker, MD  exenatide (BYETTA) 10 MCG/0.04ML SOPN injection Inject 10 mcg into the skin 2 (two) times daily.  10/11/17   [provider]  feeding supplement, GLUCERNA SHAKE, (GLUCERNA SHAKE) LIQD Take 237 mLs by mouth daily.    [provider]  FLUoxetine (PROZAC) 20 MG capsule Take 20 mg by mouth every morning.    [provider]  hydrochlorothiazide (HYDRODIURIL) 25 MG tablet Take 25 mg by mouth every morning.    [provider]  levothyroxine (SYNTHROID, LEVOTHROID) 100 MCG tablet Take 100 mcg by mouth daily before breakfast.    [provider]  losartan (COZAAR) 100 MG tablet Take 100 mg by mouth every morning.    [provider]  Melatonin 5 MG TABS Take 5-10 mg by mouth at bedtime.    [provider]  metFORMIN (GLUCOPHAGE-XR) 500 MG 24 hr tablet Take 1,000 mg by mouth 2 (two) times daily.    [provider]  omeprazole (PRILOSEC OTC) 20 MG tablet Take 20 mg by mouth daily.    [provider]  ondansetron (ZOFRAN) 4 MG tablet Take 1 tablet (4 mg total) by mouth every 8 (eight) hours as needed for nausea or vomiting. 02/11/18   Cross, Lenna Sciara D, NP  oxyCODONE (OXY IR/ROXICODONE) 5 MG immediate release tablet Take 1 tablet (5 mg total) by mouth every 4 (four) hours as needed for moderate  pain. 02/08/18   Cross, Lenna Sciara D, NP  potassium chloride (K-DUR,KLOR-CON) 10 MEQ tablet Take 10 mEq by mouth every evening.  01/23/18   [provider]  SENNA PO Take 3 tablets by mouth daily as needed (severe constipation).    [provider]  vitamin E 400 UNIT capsule Take 400 Units by mouth daily.    [provider]     Family History  Problem Relation Age of Onset  . Lymphoma Mother   . Bladder Cancer Sister   . Prostate cancer Brother   . Leukemia Brother   . Breast cancer Other 51       niece    Social History   Socioeconomic History  . Marital status: Widowed    Spouse name: Not on file  . Number of children: 2  . Years of education: Not on file  . Highest education level: Not on file  Occupational History  . Occupation: retired Glass blower/designer  Social Needs  . Financial resource strain: Not on file  . Food insecurity:    Worry: Not on file    Inability: Not on file  . Transportation needs:    Medical: Not on file    Non-medical: Not on file  Tobacco Use  . Smoking status: Never Smoker  . Smokeless tobacco: Never Used  Substance and Sexual Activity  . Alcohol use: No  . Drug use: No  . Sexual activity: Not on file  Lifestyle  . Physical activity:    Days per week: Not on file    Minutes per session: Not on file  . Stress: Not on file  Relationships  . Social connections:    Talks on phone: Not on file    Gets together: Not on file    Attends religious service: Not on file    Active member of club or organization: Not on file    Attends meetings of clubs or organizations: Not on file    Relationship status: Not on file  Other Topics Concern  . Not on file  Social History Narrative  . Not on file      Review of Systems denies fever, chest pain, dyspnea, cough, vomiting or abnormal bleeding.  She does have occasional headaches, constipation, abdominal/back discomfort, occasional nausea and diminished appetite  Vital Signs:  Blood pressure 122/56, heart rate 69, temp 98.3, respirations 16, O2 sat 96% room air   Physical Exam awake, alert.  Chest with slighty diminished breath sounds left base, right clear.  Heart with regular rate and rhythm.  Abdomen soft, positive bowel sounds, mild generalized tenderness, slightly more so in left lower quadrant; no lower extremity edema Imaging: Ct Chest W Contrast  Result Date: 02/06/2018 CLINICAL DATA:  Status post Exploratory laparotomy, Staging including infragastric omentectomy, Bilateral pelvic and paraaortic lymphadenectomy, pertioneal biopsies on 01/31/2018. Now with postoperative nausea and vomiting and abdominal pain. EXAM: CT CHEST, ABDOMEN, AND PELVIS WITH  CONTRAST TECHNIQUE: Multidetector CT imaging of the chest, abdomen and pelvis was performed following the standard protocol during bolus administration of intravenous contrast. CONTRAST:  168m OMNIPAQUE IOHEXOL 300 MG/ML  SOLN COMPARISON:  CT chest 07/11/2007. FINDINGS: CT CHEST FINDINGS Cardiovascular: The heart size is normal. No pericardial effusion. Coronary artery calcification is evident. Atherosclerotic calcification is noted in the wall of the thoracic aorta. Mediastinum/Nodes: No mediastinal lymphadenopathy. There is no hilar lymphadenopathy. Tiny hiatal hernia. Contrast material in the lumen of the esophagus may be related to dysmotility or reflux. There is no axillary lymphadenopathy. Lungs/Pleura: 7 mm right upper lobe pulmonary nodule identified image 50/series 4. Bronchiectasis and scarring noted in the right middle lobe. Calcified granuloma identified anterior right lower lobe. No focal airspace consolidation. No pulmonary edema or pleural effusion. Musculoskeletal: Bone windows reveal no worrisome lytic or sclerotic osseous lesions. CT ABDOMEN PELVIS FINDINGS Hepatobiliary: No focal abnormality within the liver parenchyma. Gallbladder surgically absent. No intrahepatic or extrahepatic biliary dilation. Pancreas:  No focal mass lesion. No dilatation of the main duct. No intraparenchymal cyst. No peripancreatic edema. Spleen: No splenomegaly. No focal mass lesion. Adrenals/Urinary Tract: No adrenal nodule or mass. 16 mm exophytic lesion upper pole right kidney has attenuation too high to be a cyst. Left kidney unremarkable. No evidence for hydroureter. Bladder is decompressed. Gas in the bladder lumen presumably related to recent instrumentation. Stomach/Bowel: Tiny hiatal hernia. Stomach otherwise unremarkable. Duodenum is normally positioned as is the ligament of Treitz. No small bowel wall thickening. No small bowel dilatation. Contrast material has migrated through to the level of the mid-distal small bowel. Distal ileum is un opacified, but fluid in gas is visible in the distal and terminal segments of the ileum. Ileocecal valve unremarkable. The appendix is normal. Colon shows diffuse mild distention with fluid in the right and transverse segments and formed stool in the distal sigmoid colon and rectum. No colonic wall thickening. Vascular/Lymphatic: There is abdominal aortic atherosclerosis without aneurysm. Portal vein and superior mesenteric vein are patent. The splenic vein is patent. Celiac axis and SMA opacify as does the IMA. There is no gastrohepatic or hepatoduodenal ligament lymphadenopathy. No intraperitoneal or retroperitoneal lymphadenopathy. Small lymph nodes are seen in the pelvic sidewall bilaterally. Small right anterior juxta diaphragmatic lymph nodes are seen (image 45/series 2). Reproductive: Uterus is surgically absent. There is no adnexal mass. Other: There is edema in the soft tissues of the pelvic floor tracking up the pelvic sidewalls bilaterally. Areas of omental edema are evident. Fat planes in the pelvic sidewalls are obscured bilaterally. There is trace fluid in the pelvis, adjacent to the inferior liver and in the intraperitoneal space. No organized or rim enhancing fluid collection  identified to suggest evolving abscess. Soft tissue gas identified in the subcutaneous tissues of the lower left anterior abdominal wall and in the extraperitoneal fat of the anterior left pelvic sidewall. Musculoskeletal: Bone windows reveal no worrisome lytic or sclerotic osseous lesions. IMPRESSION: 1. 16 mm exophytic lesion upper pole right kidney has attenuation too high to be a simple cyst. This may be a cyst complicated by proteinaceous debris or hemorrhage, but renal cell carcinoma is a concern. Routine outpatient follow-up MRI of the abdomen without and with contrast recommended after resolution of patient's acute symptoms (so she is better able to participate with positioning and breath holding). 2. Mild edema/possible minimal hemorrhage in the extraperitoneal soft tissues of the pelvic for tracking up both pelvic sidewalls. Imaging appearance is not outside of the spectrum of findings  expected 6 days after the reported surgery. Trace interloop mesenteric and free fluid is identified in the peritoneal cavity in there is some mild edema in the omentum. There is no organized or rim enhancing collection to suggest evolving abscess. 3. Gas in the extraperitoneal soft tissues of the left lower quadrant is associated with gas in the subcutaneous fat of the lower left anterior abdominal wall. This is not unexpected on postoperative day 6. No evidence for intraperitoneal free air. 4. No CT features to suggest small bowel obstruction. Oral contrast material has migrated about halfway through the small bowel loops but there is no differential distention of proximal versus distal small bowel. The colon is diffusely distended and fluid-filled proximally, but formed stool is noted in the distal colon. Given apparent slow migration of contrast and fluid-filled small and large bowel loops, a component of ileus is possible. Or Electronically Signed   By: Misty Stanley M.D.   On: 02/06/2018 07:35   Mr Abdomen Wwo  Contrast  Result Date: 02/14/2018 CLINICAL DATA:  Indeterminate right renal lesion on recent CT. EXAM: MRI ABDOMEN WITHOUT AND WITH CONTRAST TECHNIQUE: Multiplanar multisequence MR imaging of the abdomen was performed both before and after the administration of intravenous contrast. CONTRAST:  20m MULTIHANCE GADOBENATE DIMEGLUMINE 529 MG/ML IV SOLN COMPARISON:  CT on 02/05/2018 FINDINGS: Lower chest: No acute findings. Hepatobiliary: No hepatic masses identified. Prior cholecystectomy. No evidence of biliary dilatation. Pancreas:  No mass or inflammatory changes. Spleen:  Within normal limits in size and appearance. Adrenals/Urinary Tract: Normal appearance of both adrenal glands. A 1.6 cm subcapsular lesion is seen in the upper pole the right kidney which shows T1 hyperintensity and lack of contrast enhancement or other complex features. This is consistent with a benign Bosniak category 2 cyst. No other cystic or solid lesions are seen involving either kidney. No evidence of hydronephrosis. Stomach/Bowel: Visualized portion unremarkable. Vascular/Lymphatic: No pathologically enlarged lymph nodes identified. No abdominal aortic aneurysm. Other:  None. Musculoskeletal:  No suspicious bone lesions identified. IMPRESSION: Benign Bosniak category 2 cyst in upper pole of right kidney, corresponding with lesion seen on previous CT. No evidence of renal neoplasm or other significant abnormality. Electronically Signed   By: JEarle GellM.D.   On: 02/14/2018 14:35   Ct Abdomen Pelvis W Contrast  Result Date: 02/06/2018 CLINICAL DATA:  Status post Exploratory laparotomy, Staging including infragastric omentectomy, Bilateral pelvic and paraaortic lymphadenectomy, pertioneal biopsies on 01/31/2018. Now with postoperative nausea and vomiting and abdominal pain. EXAM: CT CHEST, ABDOMEN, AND PELVIS WITH CONTRAST TECHNIQUE: Multidetector CT imaging of the chest, abdomen and pelvis was performed following the standard protocol  during bolus administration of intravenous contrast. CONTRAST:  1020mOMNIPAQUE IOHEXOL 300 MG/ML  SOLN COMPARISON:  CT chest 07/11/2007. FINDINGS: CT CHEST FINDINGS Cardiovascular: The heart size is normal. No pericardial effusion. Coronary artery calcification is evident. Atherosclerotic calcification is noted in the wall of the thoracic aorta. Mediastinum/Nodes: No mediastinal lymphadenopathy. There is no hilar lymphadenopathy. Tiny hiatal hernia. Contrast material in the lumen of the esophagus may be related to dysmotility or reflux. There is no axillary lymphadenopathy. Lungs/Pleura: 7 mm right upper lobe pulmonary nodule identified image 50/series 4. Bronchiectasis and scarring noted in the right middle lobe. Calcified granuloma identified anterior right lower lobe. No focal airspace consolidation. No pulmonary edema or pleural effusion. Musculoskeletal: Bone windows reveal no worrisome lytic or sclerotic osseous lesions. CT ABDOMEN PELVIS FINDINGS Hepatobiliary: No focal abnormality within the liver parenchyma. Gallbladder surgically absent. No intrahepatic  or extrahepatic biliary dilation. Pancreas: No focal mass lesion. No dilatation of the main duct. No intraparenchymal cyst. No peripancreatic edema. Spleen: No splenomegaly. No focal mass lesion. Adrenals/Urinary Tract: No adrenal nodule or mass. 16 mm exophytic lesion upper pole right kidney has attenuation too high to be a cyst. Left kidney unremarkable. No evidence for hydroureter. Bladder is decompressed. Gas in the bladder lumen presumably related to recent instrumentation. Stomach/Bowel: Tiny hiatal hernia. Stomach otherwise unremarkable. Duodenum is normally positioned as is the ligament of Treitz. No small bowel wall thickening. No small bowel dilatation. Contrast material has migrated through to the level of the mid-distal small bowel. Distal ileum is un opacified, but fluid in gas is visible in the distal and terminal segments of the ileum.  Ileocecal valve unremarkable. The appendix is normal. Colon shows diffuse mild distention with fluid in the right and transverse segments and formed stool in the distal sigmoid colon and rectum. No colonic wall thickening. Vascular/Lymphatic: There is abdominal aortic atherosclerosis without aneurysm. Portal vein and superior mesenteric vein are patent. The splenic vein is patent. Celiac axis and SMA opacify as does the IMA. There is no gastrohepatic or hepatoduodenal ligament lymphadenopathy. No intraperitoneal or retroperitoneal lymphadenopathy. Small lymph nodes are seen in the pelvic sidewall bilaterally. Small right anterior juxta diaphragmatic lymph nodes are seen (image 45/series 2). Reproductive: Uterus is surgically absent. There is no adnexal mass. Other: There is edema in the soft tissues of the pelvic floor tracking up the pelvic sidewalls bilaterally. Areas of omental edema are evident. Fat planes in the pelvic sidewalls are obscured bilaterally. There is trace fluid in the pelvis, adjacent to the inferior liver and in the intraperitoneal space. No organized or rim enhancing fluid collection identified to suggest evolving abscess. Soft tissue gas identified in the subcutaneous tissues of the lower left anterior abdominal wall and in the extraperitoneal fat of the anterior left pelvic sidewall. Musculoskeletal: Bone windows reveal no worrisome lytic or sclerotic osseous lesions. IMPRESSION: 1. 16 mm exophytic lesion upper pole right kidney has attenuation too high to be a simple cyst. This may be a cyst complicated by proteinaceous debris or hemorrhage, but renal cell carcinoma is a concern. Routine outpatient follow-up MRI of the abdomen without and with contrast recommended after resolution of patient's acute symptoms (so she is better able to participate with positioning and breath holding). 2. Mild edema/possible minimal hemorrhage in the extraperitoneal soft tissues of the pelvic for tracking up  both pelvic sidewalls. Imaging appearance is not outside of the spectrum of findings expected 6 days after the reported surgery. Trace interloop mesenteric and free fluid is identified in the peritoneal cavity in there is some mild edema in the omentum. There is no organized or rim enhancing collection to suggest evolving abscess. 3. Gas in the extraperitoneal soft tissues of the left lower quadrant is associated with gas in the subcutaneous fat of the lower left anterior abdominal wall. This is not unexpected on postoperative day 6. No evidence for intraperitoneal free air. 4. No CT features to suggest small bowel obstruction. Oral contrast material has migrated about halfway through the small bowel loops but there is no differential distention of proximal versus distal small bowel. The colon is diffusely distended and fluid-filled proximally, but formed stool is noted in the distal colon. Given apparent slow migration of contrast and fluid-filled small and large bowel loops, a component of ileus is possible. Or Electronically Signed   By: Misty Stanley M.D.   On: 02/06/2018  07:35   Dg Abd 2 Views  Result Date: 02/07/2018 CLINICAL DATA:  Abdominal pain postop EXAM: ABDOMEN - 2 VIEW COMPARISON:  02/05/2018 FINDINGS: Supine and decubitus views. No free air on decubitus imaging. Contrast within the colon. Nonobstructed gas pattern. Surgical clips in the left lower quadrant. IMPRESSION: Nonobstructed gas pattern with contrast in the colon. Electronically Signed   By: Donavan Foil M.D.   On: 02/07/2018 16:49   Dg Abd 2 Views  Result Date: 02/04/2018 CLINICAL DATA:  Ovarian carcinoma with recent surgery EXAM: ABDOMEN - 2 VIEW COMPARISON:  None. FINDINGS: Supine and upright abdomen images obtained. There is no appreciable bowel dilatation. There are air-fluid levels in the right abdomen. There is fairly diffuse stool throughout the colon. No free air. Lung bases are clear. There is postoperative change with clips  in the left pelvis. IMPRESSION: No bowel dilatation. There are, however, air-fluid levels in the right abdomen. Suspect a degree of ileus or enteritis. Bowel obstruction not felt to be likely. Note that there is stool and air in the rectum. There is fairly diffuse stool throughout the colon. Lung bases clear. Electronically Signed   By: Lowella Grip III M.D.   On: 02/04/2018 14:06    Labs:  CBC: Recent Labs    02/01/18 0510 02/04/18 1434 02/05/18 1218 02/14/18 1054  WBC 8.5 8.0 10.0 8.8  HGB 12.6 11.9 12.2 12.0  HCT 38.5 36.7 37.2 36.1  PLT 223 242 276 425*    COAGS: No results for input(s): INR, APTT in the last 8760 hours.  BMP: Recent Labs    02/04/18 1529 02/05/18 1218 02/06/18 0556 02/14/18 1054  NA 139 138 136 140  K 4.1 4.4 3.6 4.1  CL 105 101 102 103  CO2 23 25 24 26   GLUCOSE 129 125* 143* 104  BUN 11 12 8 9   CALCIUM 9.7 9.4 8.5* 10.1  CREATININE 0.77 0.57 0.61 0.73  GFRNONAA >60 >60 >60 >60  GFRAA >60 >60 >60 >60    LIVER FUNCTION TESTS: Recent Labs    02/06/18 0556 02/07/18 0523 02/08/18 0529 02/14/18 1054  BILITOT 1.2 0.8 0.7 0.4  AST 222* 72* 21 13  ALT 163* 107* 63* 15  ALKPHOS 224* 196* 157* 101  PROT 5.8* 5.4* 5.6* 6.9  ALBUMIN 2.8* 2.7* 2.6* 3.5    TUMOR MARKERS: No results for input(s): AFPTM, CEA, CA199, CHROMGRNA in the last 8760 hours.  Assessment and Plan: 77 y.o. female with history of recently diagnosed left ovarian carcinoma, status post surgery, who presents today for Port-A-Cath placement for chemotherapy.Risks and benefits of image guided port-a-catheter placement was discussed with the patient/daughter including, but not limited to bleeding, infection, pneumothorax, or fibrin sheath development and need for additional procedures.  All of the patient's questions were answered, patient is agreeable to proceed. Consent signed and in chart.     Thank you for this interesting consult.  I greatly enjoyed meeting FAMA MUENCHOW and look forward to participating in their care.  A copy of this report was sent to the requesting provider on this date.  Electronically Signed: D. Rowe Robert, PA-C 02/28/2018, 9:59 AM   I spent a total of 25 minutes    in face to face in clinical consultation, greater than 50% of which was counseling/coordinating care for Port-A-Cath placement

## 2018-02-28 NOTE — Procedures (Signed)
Interventional Radiology Procedure Note  Procedure: Placement of a right IJ approach single lumen PowerPort.  Tip is positioned at the superior cavoatrial junction and catheter is ready for immediate use.  Complications: None Recommendations:  - Ok to shower tomorrow - Do not submerge for 7 days - Routine line care   Signed,  Karim Aiello S. Valisa Karpel, DO   

## 2018-03-01 ENCOUNTER — Encounter: Payer: Self-pay | Admitting: Oncology

## 2018-03-01 ENCOUNTER — Inpatient Hospital Stay (HOSPITAL_BASED_OUTPATIENT_CLINIC_OR_DEPARTMENT_OTHER): Payer: Medicare HMO | Admitting: Hematology and Oncology

## 2018-03-01 ENCOUNTER — Inpatient Hospital Stay: Payer: Medicare HMO

## 2018-03-01 ENCOUNTER — Telehealth: Payer: Self-pay | Admitting: Hematology and Oncology

## 2018-03-01 ENCOUNTER — Encounter: Payer: Self-pay | Admitting: Hematology and Oncology

## 2018-03-01 ENCOUNTER — Ambulatory Visit (HOSPITAL_COMMUNITY)
Admission: RE | Admit: 2018-03-01 | Discharge: 2018-03-01 | Disposition: A | Discharge status: Home / Self Care | Payer: Medicare HMO | Source: Ambulatory Visit | Attending: Hematology and Oncology | Admitting: Hematology and Oncology

## 2018-03-01 VITALS — BP 136/62 | HR 66 | Temp 98.5°F | Resp 18 | Ht 64.5 in | Wt 177.0 lb

## 2018-03-01 DIAGNOSIS — E119 Type 2 diabetes mellitus without complications: Secondary | ICD-10-CM

## 2018-03-01 DIAGNOSIS — C562 Malignant neoplasm of left ovary: Secondary | ICD-10-CM | POA: Diagnosis present

## 2018-03-01 DIAGNOSIS — E538 Deficiency of other specified B group vitamins: Secondary | ICD-10-CM

## 2018-03-01 DIAGNOSIS — Z806 Family history of leukemia: Secondary | ICD-10-CM

## 2018-03-01 DIAGNOSIS — M129 Arthropathy, unspecified: Secondary | ICD-10-CM

## 2018-03-01 DIAGNOSIS — R109 Unspecified abdominal pain: Secondary | ICD-10-CM | POA: Diagnosis not present

## 2018-03-01 DIAGNOSIS — I700 Atherosclerosis of aorta: Secondary | ICD-10-CM | POA: Diagnosis not present

## 2018-03-01 DIAGNOSIS — Z79899 Other long term (current) drug therapy: Secondary | ICD-10-CM

## 2018-03-01 DIAGNOSIS — Z8042 Family history of malignant neoplasm of prostate: Secondary | ICD-10-CM

## 2018-03-01 DIAGNOSIS — K219 Gastro-esophageal reflux disease without esophagitis: Secondary | ICD-10-CM | POA: Diagnosis not present

## 2018-03-01 DIAGNOSIS — K5909 Other constipation: Secondary | ICD-10-CM

## 2018-03-01 DIAGNOSIS — Z8052 Family history of malignant neoplasm of bladder: Secondary | ICD-10-CM

## 2018-03-01 DIAGNOSIS — Z5111 Encounter for antineoplastic chemotherapy: Secondary | ICD-10-CM

## 2018-03-01 DIAGNOSIS — R42 Dizziness and giddiness: Secondary | ICD-10-CM | POA: Diagnosis not present

## 2018-03-01 DIAGNOSIS — I10 Essential (primary) hypertension: Secondary | ICD-10-CM | POA: Diagnosis not present

## 2018-03-01 DIAGNOSIS — Z803 Family history of malignant neoplasm of breast: Secondary | ICD-10-CM

## 2018-03-01 DIAGNOSIS — E039 Hypothyroidism, unspecified: Secondary | ICD-10-CM

## 2018-03-01 DIAGNOSIS — E1140 Type 2 diabetes mellitus with diabetic neuropathy, unspecified: Secondary | ICD-10-CM | POA: Diagnosis not present

## 2018-03-01 DIAGNOSIS — E1151 Type 2 diabetes mellitus with diabetic peripheral angiopathy without gangrene: Secondary | ICD-10-CM

## 2018-03-01 DIAGNOSIS — K5900 Constipation, unspecified: Secondary | ICD-10-CM | POA: Insufficient documentation

## 2018-03-01 HISTORY — DX: Other constipation: K59.09

## 2018-03-01 LAB — CMP (CANCER CENTER ONLY)
ALK PHOS: 55 U/L (ref 40–150)
ALT: 13 U/L (ref 0–55)
ANION GAP: 9 (ref 3–11)
AST: 14 U/L (ref 5–34)
Albumin: 3.9 g/dL (ref 3.5–5.0)
BILIRUBIN TOTAL: 0.7 mg/dL (ref 0.2–1.2)
BUN: 11 mg/dL (ref 7–26)
CALCIUM: 9.6 mg/dL (ref 8.4–10.4)
CO2: 26 mmol/L (ref 22–29)
Chloride: 105 mmol/L (ref 98–109)
Creatinine: 0.72 mg/dL (ref 0.60–1.10)
GFR, Estimated: >60 mL/min (ref >60)
Glucose, Bld: 111 mg/dL (ref 70–140)
POTASSIUM: 4.1 mmol/L (ref 3.5–5.1)
SODIUM: 140 mmol/L (ref 136–145)
Total Protein: 6.7 g/dL (ref 6.4–8.3)

## 2018-03-01 LAB — CBC WITH DIFFERENTIAL (CANCER CENTER ONLY)
BASOS PCT: 1 %
Basophils Absolute: 0.1 10*3/uL (ref 0.0–0.1)
Eosinophils Absolute: 0.3 10*3/uL (ref 0.0–0.5)
Eosinophils Relative: 4 %
HEMATOCRIT: 37.1 % (ref 34.8–46.6)
Hemoglobin: 12.2 g/dL (ref 11.6–15.9)
LYMPHS PCT: 29 %
Lymphs Abs: 1.8 10*3/uL (ref 0.9–3.3)
MCH: 29.5 pg (ref 25.1–34.0)
MCHC: 33 g/dL (ref 31.5–36.0)
MCV: 89.4 fL (ref 79.5–101.0)
Monocytes Absolute: 0.4 10*3/uL (ref 0.1–0.9)
Monocytes Relative: 7 %
NEUTROS ABS: 3.9 10*3/uL (ref 1.5–6.5)
NEUTROS PCT: 59 %
Platelet Count: 219 10*3/uL (ref 145–400)
RBC: 4.15 MIL/uL (ref 3.70–5.45)
RDW: 15.5 % — AB (ref 11.2–14.5)
WBC: 6.4 10*3/uL (ref 3.9–10.3)

## 2018-03-01 MED ORDER — LIDOCAINE-PRILOCAINE 2.5-2.5 % EX CREA: TOPICAL_CREAM | CUTANEOUS | 3 refills | Status: DC

## 2018-03-01 MED ORDER — ONDANSETRON HCL 8 MG PO TABS: 8.0000 mg | ORAL_TABLET | Freq: Two times a day (BID) | ORAL | 1 refills | Status: DC | PRN

## 2018-03-01 MED ORDER — PROCHLORPERAZINE MALEATE 10 MG PO TABS: 10.0000 mg | ORAL_TABLET | Freq: Four times a day (QID) | ORAL | 1 refills | Status: DC | PRN

## 2018-03-01 MED ORDER — DEXAMETHASONE 4 MG PO TABS: ORAL_TABLET | ORAL | 0 refills | Status: DC

## 2018-03-01 NOTE — Progress Notes (Signed)
  Oncology Nurse Navigator Documentation  Navigator Location: [facility] (03/01/18 1125)   )Navigator Encounter Type: Follow-up Appt (03/01/18 1125)                     Patient Visit Type: MedOnc (03/01/18 1125) Treatment Phase: Pre-Tx/Tx Discussion (03/01/18 1125) Barriers/Navigation Needs: Coordination of Care (03/01/18 1125)                Acuity: Level 2 (03/01/18 1125)   Acuity Level 2: Ongoing guidance and education throughout treatment as needed (03/01/18 1125)     Time Spent with Patient: 15 (03/01/18 1125)    Met with patient before her appointment today.  She is wondering if she needs to take her last dose on Lovenox.  It has been on hold since 5/15 for her port placement.  Advised her that per Joylene John, NP, she does not need to take the last dose.  She verbalized understanding and agreement.

## 2018-03-01 NOTE — Progress Notes (Signed)
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 The intent is of curative intent.  We discussed some of the risks, benefits, side-effects of carboplatin & Taxol. Treatment is intravenous, every 3 weeks x 6 cycles  Some of the short term side-effects included, though not limited to, including weight loss, life threatening infections, risk of allergic reactions, need for transfusions of blood products, nausea, vomiting, change in bowel habits, loss of hair, admission to hospital for various reasons, and risks of death.   Long term side-effects are also discussed including risks of infertility, permanent damage to nerve function, hearing loss, chronic fatigue, kidney damage with possibility needing hemodialysis, and rare secondary malignancy including bone marrow disorders.  The patient is aware that the response rates discussed earlier is not guaranteed.  After a long discussion, patient made an informed decision to proceed with the prescribed plan of care.   Patient education material was dispensed. We discussed premedication with dexamethasone before chemotherapy. We also discussed G-CSF support If her severe constipation does not resolve by next week, we might have to delay the start date of treatment and order CT imaging  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) We discussed the risk of worsening hyperglycemia control during treatment We discussed dietary modification I recommend reduced dose oral premedication dexamethasone at home  Other constipation She has severe constipation for over a week She is taking stool softener and Senokot I recommend scheduled MiraLAX, daily stool softener and round-the-clock Senokot I recommend low residue diet in the  short-term I will check imaging study with x-ray of the abdomen today to rule out bowel obstruction: results are pending but no signs of bowel obstruction from my own reading Clinically, she does not has worrisome signs of bowel obstruction I will call her next week for follow-up   Orders Placed This Encounter  Procedures  . DG Abd 2 Views    Standing Status:   Future    Number of Occurrences:   1    Standing Expiration Date:   03/01/2019    Order Specific Question:   Reason for Exam (SYMPTOM  OR DIAGNOSIS REQUIRED)    Answer:   abdominal pain, severe constipation > 7 days    Order Specific Question:   Preferred imaging location?    Answer:   [facility]    Order Specific Question:   Radiology Contrast Protocol - do NOT remove file path    Answer:   [email or web address]    INTERVAL HISTORY: Please see below for problem oriented charting. She is miserable from severe constipation. Had no bowel movement for 1 week No nausea Oral intake is poor but able to hydrate Skin bruising has improved  SUMMARY OF ONCOLOGIC HISTORY: Oncology History   Neg BRCA test on tumor     Malignant neoplasm of left ovary ([place])   01/31/2018 Pathology Results    1. 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 - TUMOR INVOLVES OVARY SURFACE AND LEFT FALLOPIAN TUBE. - SEE ONCOLOGY TABLE. 2. Mesentery, small bowel mesentery biopsy #1 - BENIGN FIBROADIPOSE TISSUE. 3. Ovary and fallopian tube, right - BENIGN OVARY WITH INCLUSION CYSTS. - BENIGN FALLOPIAN TUBE WITH PARATUBAL CYSTS AND ADENOFIBROMA. 4. Omentum, resection for tumor - BENIGN ADIPOSE TISSUE. 5. Peritoneum, biopsy, left  diaphragmatic - BENIGN PERITONEAL TYPE TISSUE. 6. Peritoneum, biopsy, right diaphragmatic - BENIGN PERITONEAL TYPE TISSUE. 7. Mesentery, small bowel mesenteric biopsy #2 - BENIGN FIBROADIPOSE TISSUE. 8. Lymph nodes,  regional resection, right pelvic - FIVE OF FIVE LYMPH NODES NEGATIVE FOR CARCINOMA (0/5). 9. Lymph nodes, regional resection, left pelvic - FOUR OF FOUR LYMPH NODES NEGATIVE FOR CARCINOMA (0/4). 10. Peritoneum, biopsy, left gutter - BENIGN PERITONEAL TYPE TISSUE. 11. Peritoneum, biopsy, bladder - BENIGN PERITONEAL TYPE TISSUE WITH ACUTE INFLAMMATION AND CALCIFICATIONS.  12. Peritoneum, biopsy, cul-de-sac - BENIGN PERITONEAL TYPE TISSUE WITH ACUTE INFLAMMATION AND CALCIFICATIONS. 13. Soft tissue, biopsy, right gutter - BENIGN FIBROADIPOSE TISSUE. 14. Lymph node, biopsy, right para-aortic - TWO OF TWO LYMPH NODES NEGATIVE FOR CARCINOMA (0/2). 15. Lymph node, biopsy, left para-aortic - ONE OF ONE LYMPH NODES NEGATIVE FOR CARCINOMA (0/1). Microscopic Comment 1. OVARY Specimen(s): Left ovary and fallopian tube. Procedure: (including lymph node sampling): Bilateral salpingo-oophorectomy with omental and peritoneal biopsies and lymph node biopsies. Primary tumor site (including laterality): Left ovary. Ovarian surface involvement: Present. Ovarian capsule intact without fragmentation: Intact. Maximum tumor size (cm): 11 cm total. Histologic type: Serous cystadenocarcinoma. Grade: High grade (low grade areas also present). Peritoneal implants: (specify invasive or non-invasive): N/A. Pelvic extension (list additional structures on separate lines and if involved): Left fallopian tube. Lymph nodes: number examined 12 ; number positive 0 TNM code: pT2a, pN0, pMX FIGO Stage (based on pathologic findings, needs clinical correlation): IIA Comments: None.      01/31/2018 Pathology Results    PERITONEAL WASHING (SPECIMEN 1 OF 1 COLLECTED 01/31/18): MALIGNANT CELLS PRESENT CONSISTENT WITH METASTATIC CARCINOMA.      01/31/2018 Surgery    Procedure(s) Performed:  1. Robotic BSO and washings. 2. Exploratory laparotomy, Staging including infragastric omentectomy, Bilateral pelvic and paraaortic  lymphadenectomy, pertioneal biopsies.  Specimens: Bilateral tubes / ovaries, bilateral pelvic and paraaortic lymph nodes, peritoneal biopsies, washings and omentum.  Operative Findings: The left adnexa was adherent to the left pelvic peritoneum suspected due to the inferior retraction from the vaginal hysterectomy. Intraoperative leakage of cystic fluid from left ovary.  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  The right adnexa grossly appeared normal although slightly enlarged for her age.  There were some indurated lymph nodes but no overtly malignant lymph nodes were suspected.  Small bowel mesentery had 3-4 superficial possible implants.  1 of these was sent for frozen section returned benign.  She did have adhesive disease from her open cholecystectomy in the right upper quadrant.  No other evidence of disease in the abdomen or pelvis on palpation; specifically including the small bowel stomach and large bowel       01/31/2018 Genetic Testing    Patient has genetic testing done for BRCA mutation on tumor sample Results revealed patient has no mutation      02/05/2018 Imaging    CT scan of abdomen 1. 16 mm exophytic lesion upper pole right kidney has attenuation too high to be a simple cyst. This may be a cyst complicated by proteinaceous debris or hemorrhage, but renal cell carcinoma is a concern. Routine outpatient follow-up MRI of the abdomen without and with contrast recommended after resolution of patient's acute symptoms (so she is better able to participate with positioning and breath holding). 2. Mild edema/possible minimal hemorrhage in the extraperitoneal soft tissues of the pelvic for tracking up both pelvic sidewalls. Imaging appearance is not outside of the spectrum of findings expected 6 days after the  reported surgery. Trace interloop mesenteric and free fluid is identified in the peritoneal cavity in there is some mild edema in the  omentum. There is no organized or rim enhancing collection to suggest evolving abscess. 3. Gas in the extraperitoneal soft tissues of the left lower quadrant is associated with gas in the subcutaneous fat of the lower left anterior abdominal wall. This is not unexpected on postoperative day 6. No evidence for intraperitoneal free air. 4. No CT features to suggest small bowel obstruction. Oral contrast material has migrated about halfway through the small bowel loops but there is no differential distention of proximal versus distal small bowel. The colon is diffusely distended and fluid-filled proximally, but formed stool is noted in the distal colon. Given apparent slow migration of contrast and fluid-filled small and large bowel loops, a component of ileus is possible.      02/14/2018 Imaging    MR abdomen Benign Bosniak category 2 cyst in upper pole of right kidney, corresponding with lesion seen on previous CT. No evidence of renal neoplasm or other significant abnormality.       02/18/2018 Cancer Staging    Staging form: Ovary, Fallopian Tube, and Primary Peritoneal Carcinoma, AJCC 8th Edition - Pathologic: Stage II (pT2, pN0, cM0) - Signed by Heath Lark, MD on 02/18/2018      02/28/2018 Procedure    Status post right IJ port catheter placement. Catheter ready for use       REVIEW OF SYSTEMS:   Constitutional: Denies fevers, chills or abnormal weight loss Eyes: Denies blurriness of vision Ears, nose, mouth, throat, and face: Denies mucositis or sore throat Respiratory: Denies cough, dyspnea or wheezes Cardiovascular: Denies palpitation, chest discomfort or lower extremity swelling Skin: Denies abnormal skin rashes Lymphatics: Denies new lymphadenopathy  Neurological:Denies numbness, tingling or new weaknesses Behavioral/Psych: Mood is stable, no new changes  All other systems were reviewed with the patient and are negative.  I have reviewed the past medical history, past surgical  history, social history and family history with the patient and they are unchanged from previous note.  ALLERGIES:  is allergic to penicillins.  MEDICATIONS:  Current Outpatient Medications  Medication Sig Dispense Refill  . ALPRAZolam (XANAX) 0.5 MG tablet Take 0.5 mg by mouth at bedtime.   5  . amLODipine (NORVASC) 5 MG tablet Take 5 mg by mouth every morning.    Marland Kitchen atorvastatin (LIPITOR) 20 MG tablet Take 20 mg by mouth every evening.     . Cholecalciferol (VITAMIN D) 2000 units tablet Take 2,000 Units by mouth daily.    . Cyanocobalamin (B-12 COMPLIANCE INJECTION) 1000 MCG/ML KIT Inject 1,000 mcg as directed every 30 (thirty) days.    Marland Kitchen dexamethasone (DECADRON) 4 MG tablet Take 3 tabs the night before and 3 tabs the morning of chemotherapy, every 3 weeks, with food 60 tablet 0  . docusate sodium (COLACE) 100 MG capsule Take 200 mg by mouth daily as needed (severe constipation).    . enoxaparin (LOVENOX) 40 MG/0.4ML injection Inject 0.4 mLs (40 mg total) into the skin daily for 26 days. 26 Syringe 0  . exenatide (BYETTA) 10 MCG/0.04ML SOPN injection Inject 10 mcg into the skin 2 (two) times daily.     . feeding supplement, GLUCERNA SHAKE, (GLUCERNA SHAKE) LIQD Take 237 mLs by mouth daily.    Marland Kitchen FLUoxetine (PROZAC) 20 MG capsule Take 20 mg by mouth every morning.    . hydrochlorothiazide (HYDRODIURIL) 25 MG tablet Take 25 mg by mouth every morning.    Marland Kitchen  levothyroxine (SYNTHROID, LEVOTHROID) 100 MCG tablet Take 100 mcg by mouth daily before breakfast.    . lidocaine-prilocaine (EMLA) cream Apply to affected area once 30 g 3  . losartan (COZAAR) 100 MG tablet Take 100 mg by mouth every morning.    . Melatonin 5 MG TABS Take 5-10 mg by mouth at bedtime.    . metFORMIN (GLUCOPHAGE-XR) 500 MG 24 hr tablet Take 1,000 mg by mouth 2 (two) times daily.    Marland Kitchen omeprazole (PRILOSEC OTC) 20 MG tablet Take 20 mg by mouth daily.    . ondansetron (ZOFRAN) 4 MG tablet Take 1 tablet (4 mg total) by mouth every  8 (eight) hours as needed for nausea or vomiting. 20 tablet 0  . ondansetron (ZOFRAN) 8 MG tablet Take 1 tablet (8 mg total) by mouth 2 (two) times daily as needed for refractory nausea / vomiting. Start on day 3 after chemo. 30 tablet 1  . oxyCODONE (OXY IR/ROXICODONE) 5 MG immediate release tablet Take 1 tablet (5 mg total) by mouth every 4 (four) hours as needed for moderate pain. 30 tablet 0  . potassium chloride (K-DUR,KLOR-CON) 10 MEQ tablet Take 10 mEq by mouth every evening.     . prochlorperazine (COMPAZINE) 10 MG tablet Take 1 tablet (10 mg total) by mouth every 6 (six) hours as needed (Nausea or vomiting). 30 tablet 1  . SENNA PO Take 3 tablets by mouth daily as needed (severe constipation).    . vitamin E 400 UNIT capsule Take 400 Units by mouth daily.     No current facility-administered medications for this visit.     PHYSICAL EXAMINATION: ECOG PERFORMANCE STATUS: 1 - Symptomatic but completely ambulatory  Vitals:   03/01/18 1120  BP: 136/62  Pulse: 66  Resp: 18  Temp: 98.5 F (36.9 C)  SpO2: 100%   Filed Weights   03/01/18 1120  Weight: 177 lb (80.3 kg)    GENERAL:alert, no distress and comfortable SKIN: skin color, texture, turgor are normal, no rashes or significant lesions EYES: normal, Conjunctiva are pink and non-injected, sclera clear OROPHARYNX:no exudate, no erythema and lips, buccal mucosa, and tongue normal  NECK: supple, thyroid normal size, non-tender, without nodularity LYMPH:  no palpable lymphadenopathy in the cervical, axillary or inguinal LUNGS: clear to auscultation and percussion with normal breathing effort HEART: regular rate & rhythm and no murmurs and no lower extremity edema ABDOMEN:abdomen soft, non-tender and normal bowel sounds. Noted skin bruising Musculoskeletal:no cyanosis of digits and no clubbing  NEURO: alert & oriented x 3 with fluent speech, no focal motor/sensory deficits  LABORATORY DATA:  I have reviewed the data as  listed    Component Value Date/Time   NA 140 03/01/2018 0939   K 4.1 03/01/2018 0939   CL 105 03/01/2018 0939   CO2 26 03/01/2018 0939   GLUCOSE 111 03/01/2018 0939   BUN 11 03/01/2018 0939   CREATININE 0.72 03/01/2018 0939   CALCIUM 9.6 03/01/2018 0939   PROT 6.7 03/01/2018 0939   ALBUMIN 3.9 03/01/2018 0939   AST 14 03/01/2018 0939   ALT 13 03/01/2018 0939   ALKPHOS 55 03/01/2018 0939   BILITOT 0.7 03/01/2018 0939   GFRNONAA >60 03/01/2018 0939   GFRAA >60 03/01/2018 0939    No results found for: SPEP, UPEP  Lab Results  Component Value Date   WBC 6.4 03/01/2018   NEUTROABS 3.9 03/01/2018   HGB 12.2 03/01/2018   HCT 37.1 03/01/2018   MCV 89.4 03/01/2018   PLT  219 03/01/2018      Chemistry      Component Value Date/Time   NA 140 03/01/2018 0939   K 4.1 03/01/2018 0939   CL 105 03/01/2018 0939   CO2 26 03/01/2018 0939   BUN 11 03/01/2018 0939   CREATININE 0.72 03/01/2018 0939      Component Value Date/Time   CALCIUM 9.6 03/01/2018 0939   ALKPHOS 55 03/01/2018 0939   AST 14 03/01/2018 0939   ALT 13 03/01/2018 0939   BILITOT 0.7 03/01/2018 0939       RADIOGRAPHIC STUDIES: I have personally reviewed the radiological images as listed and agreed with the findings in the report. Ct Chest W Contrast  Result Date: 02/06/2018 CLINICAL DATA:  Status post Exploratory laparotomy, Staging including infragastric omentectomy, Bilateral pelvic and paraaortic lymphadenectomy, pertioneal biopsies on 01/31/2018. Now with postoperative nausea and vomiting and abdominal pain. EXAM: CT CHEST, ABDOMEN, AND PELVIS WITH CONTRAST TECHNIQUE: Multidetector CT imaging of the chest, abdomen and pelvis was performed following the standard protocol during bolus administration of intravenous contrast. CONTRAST:  157m OMNIPAQUE IOHEXOL 300 MG/ML  SOLN COMPARISON:  CT chest 07/11/2007. FINDINGS: CT CHEST FINDINGS Cardiovascular: The heart size is normal. No pericardial effusion. Coronary  artery calcification is evident. Atherosclerotic calcification is noted in the wall of the thoracic aorta. Mediastinum/Nodes: No mediastinal lymphadenopathy. There is no hilar lymphadenopathy. Tiny hiatal hernia. Contrast material in the lumen of the esophagus may be related to dysmotility or reflux. There is no axillary lymphadenopathy. Lungs/Pleura: 7 mm right upper lobe pulmonary nodule identified image 50/series 4. Bronchiectasis and scarring noted in the right middle lobe. Calcified granuloma identified anterior right lower lobe. No focal airspace consolidation. No pulmonary edema or pleural effusion. Musculoskeletal: Bone windows reveal no worrisome lytic or sclerotic osseous lesions. CT ABDOMEN PELVIS FINDINGS Hepatobiliary: No focal abnormality within the liver parenchyma. Gallbladder surgically absent. No intrahepatic or extrahepatic biliary dilation. Pancreas: No focal mass lesion. No dilatation of the main duct. No intraparenchymal cyst. No peripancreatic edema. Spleen: No splenomegaly. No focal mass lesion. Adrenals/Urinary Tract: No adrenal nodule or mass. 16 mm exophytic lesion upper pole right kidney has attenuation too high to be a cyst. Left kidney unremarkable. No evidence for hydroureter. Bladder is decompressed. Gas in the bladder lumen presumably related to recent instrumentation. Stomach/Bowel: Tiny hiatal hernia. Stomach otherwise unremarkable. Duodenum is normally positioned as is the ligament of Treitz. No small bowel wall thickening. No small bowel dilatation. Contrast material has migrated through to the level of the mid-distal small bowel. Distal ileum is un opacified, but fluid in gas is visible in the distal and terminal segments of the ileum. Ileocecal valve unremarkable. The appendix is normal. Colon shows diffuse mild distention with fluid in the right and transverse segments and formed stool in the distal sigmoid colon and rectum. No colonic wall thickening. Vascular/Lymphatic:  There is abdominal aortic atherosclerosis without aneurysm. Portal vein and superior mesenteric vein are patent. The splenic vein is patent. Celiac axis and SMA opacify as does the IMA. There is no gastrohepatic or hepatoduodenal ligament lymphadenopathy. No intraperitoneal or retroperitoneal lymphadenopathy. Small lymph nodes are seen in the pelvic sidewall bilaterally. Small right anterior juxta diaphragmatic lymph nodes are seen (image 45/series 2). Reproductive: Uterus is surgically absent. There is no adnexal mass. Other: There is edema in the soft tissues of the pelvic floor tracking up the pelvic sidewalls bilaterally. Areas of omental edema are evident. Fat planes in the pelvic sidewalls are obscured bilaterally. There is trace  fluid in the pelvis, adjacent to the inferior liver and in the intraperitoneal space. No organized or rim enhancing fluid collection identified to suggest evolving abscess. Soft tissue gas identified in the subcutaneous tissues of the lower left anterior abdominal wall and in the extraperitoneal fat of the anterior left pelvic sidewall. Musculoskeletal: Bone windows reveal no worrisome lytic or sclerotic osseous lesions. IMPRESSION: 1. 16 mm exophytic lesion upper pole right kidney has attenuation too high to be a simple cyst. This may be a cyst complicated by proteinaceous debris or hemorrhage, but renal cell carcinoma is a concern. Routine outpatient follow-up MRI of the abdomen without and with contrast recommended after resolution of patient's acute symptoms (so she is better able to participate with positioning and breath holding). 2. Mild edema/possible minimal hemorrhage in the extraperitoneal soft tissues of the pelvic for tracking up both pelvic sidewalls. Imaging appearance is not outside of the spectrum of findings expected 6 days after the reported surgery. Trace interloop mesenteric and free fluid is identified in the peritoneal cavity in there is some mild edema in the  omentum. There is no organized or rim enhancing collection to suggest evolving abscess. 3. Gas in the extraperitoneal soft tissues of the left lower quadrant is associated with gas in the subcutaneous fat of the lower left anterior abdominal wall. This is not unexpected on postoperative day 6. No evidence for intraperitoneal free air. 4. No CT features to suggest small bowel obstruction. Oral contrast material has migrated about halfway through the small bowel loops but there is no differential distention of proximal versus distal small bowel. The colon is diffusely distended and fluid-filled proximally, but formed stool is noted in the distal colon. Given apparent slow migration of contrast and fluid-filled small and large bowel loops, a component of ileus is possible. Or Electronically Signed   By: Misty Stanley M.D.   On: 02/06/2018 07:35   Mr Abdomen Wwo Contrast  Result Date: 02/14/2018 CLINICAL DATA:  Indeterminate right renal lesion on recent CT. EXAM: MRI ABDOMEN WITHOUT AND WITH CONTRAST TECHNIQUE: Multiplanar multisequence MR imaging of the abdomen was performed both before and after the administration of intravenous contrast. CONTRAST:  86m MULTIHANCE GADOBENATE DIMEGLUMINE 529 MG/ML IV SOLN COMPARISON:  CT on 02/05/2018 FINDINGS: Lower chest: No acute findings. Hepatobiliary: No hepatic masses identified. Prior cholecystectomy. No evidence of biliary dilatation. Pancreas:  No mass or inflammatory changes. Spleen:  Within normal limits in size and appearance. Adrenals/Urinary Tract: Normal appearance of both adrenal glands. A 1.6 cm subcapsular lesion is seen in the upper pole the right kidney which shows T1 hyperintensity and lack of contrast enhancement or other complex features. This is consistent with a benign Bosniak category 2 cyst. No other cystic or solid lesions are seen involving either kidney. No evidence of hydronephrosis. Stomach/Bowel: Visualized portion unremarkable. Vascular/Lymphatic:  No pathologically enlarged lymph nodes identified. No abdominal aortic aneurysm. Other:  None. Musculoskeletal:  No suspicious bone lesions identified. IMPRESSION: Benign Bosniak category 2 cyst in upper pole of right kidney, corresponding with lesion seen on previous CT. No evidence of renal neoplasm or other significant abnormality. Electronically Signed   By: JEarle GellM.D.   On: 02/14/2018 14:35   Ct Abdomen Pelvis W Contrast  Result Date: 02/06/2018 CLINICAL DATA:  Status post Exploratory laparotomy, Staging including infragastric omentectomy, Bilateral pelvic and paraaortic lymphadenectomy, pertioneal biopsies on 01/31/2018. Now with postoperative nausea and vomiting and abdominal pain. EXAM: CT CHEST, ABDOMEN, AND PELVIS WITH CONTRAST TECHNIQUE: Multidetector CT imaging  of the chest, abdomen and pelvis was performed following the standard protocol during bolus administration of intravenous contrast. CONTRAST:  176m OMNIPAQUE IOHEXOL 300 MG/ML  SOLN COMPARISON:  CT chest 07/11/2007. FINDINGS: CT CHEST FINDINGS Cardiovascular: The heart size is normal. No pericardial effusion. Coronary artery calcification is evident. Atherosclerotic calcification is noted in the wall of the thoracic aorta. Mediastinum/Nodes: No mediastinal lymphadenopathy. There is no hilar lymphadenopathy. Tiny hiatal hernia. Contrast material in the lumen of the esophagus may be related to dysmotility or reflux. There is no axillary lymphadenopathy. Lungs/Pleura: 7 mm right upper lobe pulmonary nodule identified image 50/series 4. Bronchiectasis and scarring noted in the right middle lobe. Calcified granuloma identified anterior right lower lobe. No focal airspace consolidation. No pulmonary edema or pleural effusion. Musculoskeletal: Bone windows reveal no worrisome lytic or sclerotic osseous lesions. CT ABDOMEN PELVIS FINDINGS Hepatobiliary: No focal abnormality within the liver parenchyma. Gallbladder surgically absent. No  intrahepatic or extrahepatic biliary dilation. Pancreas: No focal mass lesion. No dilatation of the main duct. No intraparenchymal cyst. No peripancreatic edema. Spleen: No splenomegaly. No focal mass lesion. Adrenals/Urinary Tract: No adrenal nodule or mass. 16 mm exophytic lesion upper pole right kidney has attenuation too high to be a cyst. Left kidney unremarkable. No evidence for hydroureter. Bladder is decompressed. Gas in the bladder lumen presumably related to recent instrumentation. Stomach/Bowel: Tiny hiatal hernia. Stomach otherwise unremarkable. Duodenum is normally positioned as is the ligament of Treitz. No small bowel wall thickening. No small bowel dilatation. Contrast material has migrated through to the level of the mid-distal small bowel. Distal ileum is un opacified, but fluid in gas is visible in the distal and terminal segments of the ileum. Ileocecal valve unremarkable. The appendix is normal. Colon shows diffuse mild distention with fluid in the right and transverse segments and formed stool in the distal sigmoid colon and rectum. No colonic wall thickening. Vascular/Lymphatic: There is abdominal aortic atherosclerosis without aneurysm. Portal vein and superior mesenteric vein are patent. The splenic vein is patent. Celiac axis and SMA opacify as does the IMA. There is no gastrohepatic or hepatoduodenal ligament lymphadenopathy. No intraperitoneal or retroperitoneal lymphadenopathy. Small lymph nodes are seen in the pelvic sidewall bilaterally. Small right anterior juxta diaphragmatic lymph nodes are seen (image 45/series 2). Reproductive: Uterus is surgically absent. There is no adnexal mass. Other: There is edema in the soft tissues of the pelvic floor tracking up the pelvic sidewalls bilaterally. Areas of omental edema are evident. Fat planes in the pelvic sidewalls are obscured bilaterally. There is trace fluid in the pelvis, adjacent to the inferior liver and in the intraperitoneal  space. No organized or rim enhancing fluid collection identified to suggest evolving abscess. Soft tissue gas identified in the subcutaneous tissues of the lower left anterior abdominal wall and in the extraperitoneal fat of the anterior left pelvic sidewall. Musculoskeletal: Bone windows reveal no worrisome lytic or sclerotic osseous lesions. IMPRESSION: 1. 16 mm exophytic lesion upper pole right kidney has attenuation too high to be a simple cyst. This may be a cyst complicated by proteinaceous debris or hemorrhage, but renal cell carcinoma is a concern. Routine outpatient follow-up MRI of the abdomen without and with contrast recommended after resolution of patient's acute symptoms (so she is better able to participate with positioning and breath holding). 2. Mild edema/possible minimal hemorrhage in the extraperitoneal soft tissues of the pelvic for tracking up both pelvic sidewalls. Imaging appearance is not outside of the spectrum of findings expected 6 days after the  reported surgery. Trace interloop mesenteric and free fluid is identified in the peritoneal cavity in there is some mild edema in the omentum. There is no organized or rim enhancing collection to suggest evolving abscess. 3. Gas in the extraperitoneal soft tissues of the left lower quadrant is associated with gas in the subcutaneous fat of the lower left anterior abdominal wall. This is not unexpected on postoperative day 6. No evidence for intraperitoneal free air. 4. No CT features to suggest small bowel obstruction. Oral contrast material has migrated about halfway through the small bowel loops but there is no differential distention of proximal versus distal small bowel. The colon is diffusely distended and fluid-filled proximally, but formed stool is noted in the distal colon. Given apparent slow migration of contrast and fluid-filled small and large bowel loops, a component of ileus is possible. Or Electronically Signed   By: Misty Stanley  M.D.   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 ANESTHESIA/SEDATION: Moderate (conscious) sedation was employed during this procedure. A total of Versed 1.5 mg and Fentanyl 75 mcg was administered intravenously. Moderate Sedation Time: 18 minutes. The patient's level of consciousness and vital signs were monitored continuously by radiology nursing throughout the procedure under my direct supervision. FLUOROSCOPY TIME:  0 minutes, 6 seconds (0.5 mGy) COMPLICATIONS: None PROCEDURE: The procedure, risks, benefits, and alternatives were explained to the patient. Questions regarding the procedure were encouraged and answered. The patient understands and consents to the procedure. Ultrasound survey was performed with images stored and sent to PACs. The right neck and chest was prepped with chlorhexidine, and draped in the usual sterile fashion using maximum barrier technique (cap and mask, sterile gown, sterile gloves, large sterile sheet, hand hygiene and cutaneous antiseptic). Antibiotic prophylaxis was provided with 2.0g Ancef administered IV one hour prior to skin incision. Local anesthesia was attained by infiltration with 1% lidocaine without epinephrine. Ultrasound demonstrated patency of the right internal jugular vein, and this was documented with an image. Under real-time ultrasound guidance, this vein was accessed with a 21 gauge micropuncture needle and image documentation was performed. A small dermatotomy was made at the access site with an 11 scalpel. A 0.018" wire was advanced into the SVC and used to estimate the length of the internal catheter. The access  needle exchanged for a 43F micropuncture vascular sheath. The 0.018" wire was then removed and a 0.035" wire advanced into the IVC. An appropriate location for the subcutaneous reservoir was selected below the clavicle and an incision was made through the skin and underlying soft tissues. The subcutaneous tissues were then dissected using a combination of blunt and sharp surgical technique and a pocket was formed. A single lumen power injectable portacatheter was then tunneled through the subcutaneous tissues from the pocket to the dermatotomy and the port reservoir placed within the subcutaneous pocket. The venous access site was then serially dilated and a peel away vascular sheath placed over the wire. The wire was removed and the port catheter advanced into position under fluoroscopic guidance. The catheter tip is positioned in the cavoatrial junction. This was documented with a spot image. The portacatheter was then tested and found to flush and aspirate well. The port  was flushed with saline followed by 100 units/mL heparinized saline. The pocket was then closed in two layers using first subdermal inverted interrupted absorbable sutures followed by a running subcuticular suture. The epidermis was then sealed with Dermabond. The dermatotomy at the venous access site was also seal with Dermabond. Patient tolerated the procedure well and remained hemodynamically stable throughout. No complications encountered and no significant blood loss encountered IMPRESSION: Status post right IJ port catheter placement. Catheter ready for use. Signed, Dulcy Fanny. Earleen Newport, DO [facility] Electronically Signed   By: Corrie Mckusick D.O.   On: 02/28/2018 13:53   Dg Abd 2 Views  Result Date: 02/07/2018 CLINICAL DATA:  Abdominal pain postop EXAM: ABDOMEN - 2 VIEW COMPARISON:  02/05/2018 FINDINGS: Supine and decubitus views. No free air on decubitus imaging. Contrast within  the colon. Nonobstructed gas pattern. Surgical clips in the left lower quadrant. IMPRESSION: Nonobstructed gas pattern with contrast in the colon. Electronically Signed   By: Donavan Foil M.D.   On: 02/07/2018 16:49   Dg Abd 2 Views  Result Date: 02/04/2018 CLINICAL DATA:  Ovarian carcinoma with recent surgery EXAM: ABDOMEN - 2 VIEW COMPARISON:  None. FINDINGS: Supine and upright abdomen images obtained. There is no appreciable bowel dilatation. There are air-fluid levels in the right abdomen. There is fairly diffuse stool throughout the colon. No free air. Lung bases are clear. There is postoperative change with clips in the left pelvis. IMPRESSION: No bowel dilatation. There are, however, air-fluid levels in the right abdomen. Suspect a degree of ileus or enteritis. Bowel obstruction not felt to be likely. Note that there is stool and air in the rectum. There is fairly diffuse stool throughout the colon. Lung bases clear. Electronically Signed   By: Lowella Grip III M.D.   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 ANESTHESIA/SEDATION: Moderate (conscious) sedation was employed during this procedure. A total of Versed 1.5 mg and Fentanyl 75 mcg was administered intravenously. Moderate Sedation Time: 18 minutes. The patient's level of consciousness and vital signs were monitored continuously by radiology nursing throughout the procedure under my direct supervision. FLUOROSCOPY TIME:  0 minutes, 6 seconds (0.5 mGy) COMPLICATIONS: None PROCEDURE: The procedure, risks, benefits, and alternatives were explained to the patient. Questions regarding the procedure  were encouraged and answered. The patient understands and consents to the procedure. Ultrasound survey was performed with images stored and sent to PACs. The right neck and chest was prepped with chlorhexidine, and draped in the usual sterile fashion using maximum barrier technique (cap and mask, sterile gown, sterile gloves, large sterile sheet, hand hygiene and cutaneous antiseptic). Antibiotic prophylaxis was provided with 2.0g Ancef administered IV one hour prior to skin incision. Local anesthesia was attained by infiltration with 1% lidocaine without epinephrine. Ultrasound demonstrated patency of the right internal jugular vein, and this was documented with an image. Under real-time ultrasound guidance, this vein was accessed with a 21 gauge micropuncture needle and image documentation was performed. A small dermatotomy was made at the access site with an 11 scalpel. A 0.018" wire was advanced into the SVC and used to estimate the length of the internal catheter. The access needle exchanged  for a 60F micropuncture vascular sheath. The 0.018" wire was then removed and a 0.035" wire advanced into the IVC. An appropriate location for the subcutaneous reservoir was selected below the clavicle and an incision was made through the skin and underlying soft tissues. The subcutaneous tissues were then dissected using a combination of blunt and sharp surgical technique and a pocket was formed. A single lumen power injectable portacatheter was then tunneled through the subcutaneous tissues from the pocket to the dermatotomy and the port reservoir placed within the subcutaneous pocket. The venous access site was then serially dilated and a peel away vascular sheath placed over the wire. The wire was removed and the port catheter advanced into position under fluoroscopic guidance. The catheter tip is positioned in the cavoatrial junction. This was documented with a spot image. The portacatheter was then tested and found to  flush and aspirate well. The port was flushed with saline followed by 100 units/mL heparinized saline. The pocket was then closed in two layers using first subdermal inverted interrupted absorbable sutures followed by a running subcuticular suture. The epidermis was then sealed with Dermabond. The dermatotomy at the venous access site was also seal with Dermabond. Patient tolerated the procedure well and remained hemodynamically stable throughout. No complications encountered and no significant blood loss encountered IMPRESSION: Status post right IJ port catheter placement. Catheter ready for use. Signed, Dulcy Fanny. Earleen Newport, DO [facility] Electronically Signed   By: Corrie Mckusick D.O.   On: 02/28/2018 13:53    All questions were answered. The patient knows to call the clinic with any problems, questions or concerns. No barriers to learning was detected.  I spent 25 minutes counseling the patient face to face. The total time spent in the appointment was 30 minutes and more than 50% was on counseling and review of test results  Heath Lark, MD 03/01/2018 3:35 PM

## 2018-03-01 NOTE — Assessment & Plan Note (Signed)
We discussed the risk of worsening hyperglycemia control during treatment We discussed dietary modification I recommend reduced dose oral premedication dexamethasone at home

## 2018-03-01 NOTE — Assessment & Plan Note (Signed)
We reviewed the NCCN guidelines We discussed the role of chemotherapy. The intent is of curative intent.  We discussed some of the risks, benefits, side-effects of carboplatin & Taxol. Treatment is intravenous, every 3 weeks x 6 cycles  Some of the short term side-effects included, though not limited to, including weight loss, life threatening infections, risk of allergic reactions, need for transfusions of blood products, nausea, vomiting, change in bowel habits, loss of hair, admission to hospital for various reasons, and risks of death.   Long term side-effects are also discussed including risks of infertility, permanent damage to nerve function, hearing loss, chronic fatigue, kidney damage with possibility needing hemodialysis, and rare secondary malignancy including bone marrow disorders.  The patient is aware that the response rates discussed earlier is not guaranteed.  After a long discussion, patient made an informed decision to proceed with the prescribed plan of care.   Patient education material was dispensed. We discussed premedication with dexamethasone before chemotherapy. We also discussed G-CSF support If her severe constipation does not resolve by next week, we might have to delay the start date of treatment and order CT imaging

## 2018-03-01 NOTE — Telephone Encounter (Signed)
I called her X-ray is pending She had a bowel movement recently after I saw her I recommend her to continue daily bowel regimen with Miralax, stool softener and Sennokot I will send scheduling msg to move her injection appt to Thursday to minimize her travel

## 2018-03-01 NOTE — Assessment & Plan Note (Addendum)
She has severe constipation for over a week She is taking stool softener and Senokot I recommend scheduled MiraLAX, daily stool softener and round-the-clock Senokot I recommend low residue diet in the short-term I will check imaging study with x-ray of the abdomen today to rule out bowel obstruction: results are pending but no signs of bowel obstruction from my own reading Clinically, she does not has worrisome signs of bowel obstruction I will call her next week for follow-up

## 2018-03-02 LAB — CA 125: Cancer Antigen (CA) 125: 13.6 U/mL (ref 0.0–38.1)

## 2018-03-04 ENCOUNTER — Other Ambulatory Visit: Payer: Self-pay | Admitting: Hematology and Oncology

## 2018-03-04 ENCOUNTER — Inpatient Hospital Stay: Payer: Medicare HMO

## 2018-03-04 ENCOUNTER — Telehealth: Payer: Self-pay

## 2018-03-04 ENCOUNTER — Encounter: Payer: Self-pay | Admitting: Hematology and Oncology

## 2018-03-04 ENCOUNTER — Encounter: Payer: Self-pay | Admitting: Oncology

## 2018-03-04 ENCOUNTER — Telehealth: Payer: Self-pay | Admitting: Oncology

## 2018-03-04 VITALS — BP 137/66 | HR 74 | Temp 98.0°F | Resp 18

## 2018-03-04 DIAGNOSIS — C562 Malignant neoplasm of left ovary: Secondary | ICD-10-CM

## 2018-03-04 DIAGNOSIS — Z5111 Encounter for antineoplastic chemotherapy: Secondary | ICD-10-CM | POA: Diagnosis not present

## 2018-03-04 MED ORDER — DIPHENHYDRAMINE HCL 50 MG/ML IJ SOLN
INTRAMUSCULAR | Status: AC
Start: 2018-03-04 — End: ?
  Filled 2018-03-04: qty 1

## 2018-03-04 MED ORDER — HEPARIN SOD (PORK) LOCK FLUSH 100 UNIT/ML IV SOLN
500.0000 [IU] | Freq: Once | INTRAVENOUS | Status: AC | PRN
  Administered 2018-03-04: 500 [IU]
  Filled 2018-03-04: qty 5

## 2018-03-04 MED ORDER — DIPHENHYDRAMINE HCL 50 MG/ML IJ SOLN
50.0000 mg | Freq: Once | INTRAMUSCULAR | Status: AC
  Administered 2018-03-04: 50 mg via INTRAVENOUS

## 2018-03-04 MED ORDER — FOSAPREPITANT DIMEGLUMINE INJECTION 150 MG
Freq: Once | INTRAVENOUS | Status: AC
  Administered 2018-03-04: 09:00:00 via INTRAVENOUS
  Filled 2018-03-04: qty 5

## 2018-03-04 MED ORDER — SODIUM CHLORIDE 0.9 % IV SOLN
510.0000 mg | Freq: Once | INTRAVENOUS | Status: AC
  Administered 2018-03-04: 510 mg via INTRAVENOUS
  Filled 2018-03-04: qty 51

## 2018-03-04 MED ORDER — PALONOSETRON HCL INJECTION 0.25 MG/5ML
INTRAVENOUS | Status: AC
  Filled 2018-03-04: qty 5

## 2018-03-04 MED ORDER — PALONOSETRON HCL INJECTION 0.25 MG/5ML
0.2500 mg | Freq: Once | INTRAVENOUS | Status: AC
  Administered 2018-03-04: 0.25 mg via INTRAVENOUS

## 2018-03-04 MED ORDER — SODIUM CHLORIDE 0.9 % IV SOLN
Freq: Once | INTRAVENOUS | Status: AC
  Administered 2018-03-04: 09:00:00 via INTRAVENOUS

## 2018-03-04 MED ORDER — SODIUM CHLORIDE 0.9% FLUSH
10.0000 mL | INTRAVENOUS | Status: DC | PRN
  Administered 2018-03-04: 10 mL
  Filled 2018-03-04: qty 10

## 2018-03-04 MED ORDER — FAMOTIDINE IN NACL 20-0.9 MG/50ML-% IV SOLN
INTRAVENOUS | Status: AC
  Filled 2018-03-04: qty 50

## 2018-03-04 MED ORDER — SODIUM CHLORIDE 0.9 % IV SOLN
175.0000 mg/m2 | Freq: Once | INTRAVENOUS | Status: AC
  Administered 2018-03-04: 336 mg via INTRAVENOUS
  Filled 2018-03-04: qty 56

## 2018-03-04 MED ORDER — FAMOTIDINE IN NACL 20-0.9 MG/50ML-% IV SOLN
20.0000 mg | Freq: Once | INTRAVENOUS | Status: AC
  Administered 2018-03-04: 20 mg via INTRAVENOUS

## 2018-03-04 NOTE — Telephone Encounter (Addendum)
[name] called and said she talked to [facility] and was told that the order for [name] needs to be sent to Medicare part B and also that the [employer] Team can be called at [phone] to approve the medication.  Talked to [facility] with chemotherapy authorization who said that it does not go through part B, it needs to be approved by [facility].  She also said it has already been denied by [facility] Team.  Advised patient of this and she called back and said she will be bringing in forms to fill out tomorrow for an appeal.

## 2018-03-04 NOTE — Patient Instructions (Signed)
[facility] Discharge Instructions for Patients Receiving Chemotherapy  Today you received the following chemotherapy agents Taxol and Carboplatin  To help prevent nausea and vomiting after your treatment, we encourage you to take your nausea medication as directed   If you develop nausea and vomiting that is not controlled by your nausea medication, call the clinic.   BELOW ARE SYMPTOMS THAT SHOULD BE REPORTED IMMEDIATELY:  *FEVER GREATER THAN 100.5 F  *CHILLS WITH OR WITHOUT FEVER  NAUSEA AND VOMITING THAT IS NOT CONTROLLED WITH YOUR NAUSEA MEDICATION  *UNUSUAL SHORTNESS OF BREATH  *UNUSUAL BRUISING OR BLEEDING  TENDERNESS IN MOUTH AND THROAT WITH OR WITHOUT PRESENCE OF ULCERS  *URINARY PROBLEMS  *BOWEL PROBLEMS  UNUSUAL RASH Items with * indicate a potential emergency and should be followed up as soon as possible.  Feel free to call the clinic should you have any questions or concerns. The clinic phone number is [phone].  Please show the CHEMO ALERT CARD at check-in to the Emergency Department and triage nurse.   Paclitaxel (Taxol) injection What is this medicine? PACLITAXEL (PAK li TAX el) is a chemotherapy drug. It targets fast dividing cells, like cancer cells, and causes these cells to die. This medicine is used to treat ovarian cancer, breast cancer, and other cancers. This medicine may be used for other purposes; ask your health care provider or pharmacist if you have questions. COMMON BRAND NAME(S): Onxol, Taxol What should I tell my health care provider before I take this medicine? They need to know if you have any of these conditions: -blood disorders -irregular heartbeat -infection (especially a virus infection such as chickenpox, cold sores, or herpes) -liver disease -previous or ongoing radiation therapy -an unusual or allergic reaction to paclitaxel, alcohol, polyoxyethylated castor oil, other chemotherapy agents, other medicines,  foods, dyes, or preservatives -pregnant or trying to get pregnant -breast-feeding How should I use this medicine? This drug is given as an infusion into a vein. It is administered in a hospital or clinic by a specially trained health care professional. Talk to your pediatrician regarding the use of this medicine in children. Special care may be needed. Overdosage: If you think you have taken too much of this medicine contact a poison control center or emergency room at once. NOTE: This medicine is only for you. Do not share this medicine with others. What if I miss a dose? It is important not to miss your dose. Call your doctor or health care professional if you are unable to keep an appointment. What may interact with this medicine? Do not take this medicine with any of the following medications: -disulfiram -metronidazole This medicine may also interact with the following medications: -cyclosporine -diazepam -ketoconazole -medicines to increase blood counts like filgrastim, pegfilgrastim, sargramostim -other chemotherapy drugs like cisplatin, doxorubicin, epirubicin, etoposide, teniposide, vincristine -quinidine -testosterone -vaccines -verapamil Talk to your doctor or health care professional before taking any of these medicines: -acetaminophen -aspirin -ibuprofen -ketoprofen -naproxen This list may not describe all possible interactions. Give your health care provider a list of all the medicines, herbs, non-prescription drugs, or dietary supplements you use. Also tell them if you smoke, drink alcohol, or use illegal drugs. Some items may interact with your medicine. What should I watch for while using this medicine? Your condition will be monitored carefully while you are receiving this medicine. You will need important blood work done while you are taking this medicine. This medicine can cause serious allergic reactions. To reduce your risk you   will need to take other  medicine(s) before treatment with this medicine. If you experience allergic reactions like skin rash, itching or hives, swelling of the face, lips, or tongue, tell your doctor or health care professional right away. In some cases, you may be given additional medicines to help with side effects. Follow all directions for their use. This drug may make you feel generally unwell. This is not uncommon, as chemotherapy can affect healthy cells as well as cancer cells. Report any side effects. Continue your course of treatment even though you feel ill unless your doctor tells you to stop. Call your doctor or health care professional for advice if you get a fever, chills or sore throat, or other symptoms of a cold or flu. Do not treat yourself. This drug decreases your body's ability to fight infections. Try to avoid being around people who are sick. This medicine may increase your risk to bruise or bleed. Call your doctor or health care professional if you notice any unusual bleeding. Be careful brushing and flossing your teeth or using a toothpick because you may get an infection or bleed more easily. If you have any dental work done, tell your dentist you are receiving this medicine. Avoid taking products that contain aspirin, acetaminophen, ibuprofen, naproxen, or ketoprofen unless instructed by your doctor. These medicines may hide a fever. Do not become pregnant while taking this medicine. Women should inform their doctor if they wish to become pregnant or think they might be pregnant. There is a potential for serious side effects to an unborn child. Talk to your health care professional or pharmacist for more information. Do not breast-feed an infant while taking this medicine. Men are advised not to father a child while receiving this medicine. This product may contain alcohol. Ask your pharmacist or healthcare provider if this medicine contains alcohol. Be sure to tell all healthcare providers you are  taking this medicine. Certain medicines, like metronidazole and disulfiram, can cause an unpleasant reaction when taken with alcohol. The reaction includes flushing, headache, nausea, vomiting, sweating, and increased thirst. The reaction can last from 30 minutes to several hours. What side effects may I notice from receiving this medicine? Side effects that you should report to your doctor or health care professional as soon as possible: -allergic reactions like skin rash, itching or hives, swelling of the face, lips, or tongue -low blood counts - This drug may decrease the number of white blood cells, red blood cells and platelets. You may be at increased risk for infections and bleeding. -signs of infection - fever or chills, cough, sore throat, pain or difficulty passing urine -signs of decreased platelets or bleeding - bruising, pinpoint red spots on the skin, black, tarry stools, nosebleeds -signs of decreased red blood cells - unusually weak or tired, fainting spells, lightheadedness -breathing problems -chest pain -high or low blood pressure -mouth sores -nausea and vomiting -pain, swelling, redness or irritation at the injection site -pain, tingling, numbness in the hands or feet -slow or irregular heartbeat -swelling of the ankle, feet, hands Side effects that usually do not require medical attention (report to your doctor or health care professional if they continue or are bothersome): -bone pain -complete hair loss including hair on your head, underarms, pubic hair, eyebrows, and eyelashes -changes in the color of fingernails -diarrhea -loosening of the fingernails -loss of appetite -muscle or joint pain -red flush to skin -sweating This list may not describe all possible side effects. Call your doctor for   medical advice about side effects. You may report side effects to FDA at 1-800-FDA-1088. Where should I keep my medicine? This drug is given in a hospital or clinic and  will not be stored at home. NOTE: This sheet is a summary. It may not cover all possible information. If you have questions about this medicine, talk to your doctor, pharmacist, or health care provider.  2018 Elsevier/Gold Standard (2015-08-03 19:58:00)   Carboplatin injection What is this medicine? CARBOPLATIN (KAR boe pla tin) is a chemotherapy drug. It targets fast dividing cells, like cancer cells, and causes these cells to die. This medicine is used to treat ovarian cancer and many other cancers. This medicine may be used for other purposes; ask your health care provider or pharmacist if you have questions. COMMON BRAND NAME(S): Paraplatin What should I tell my health care provider before I take this medicine? They need to know if you have any of these conditions: -blood disorders -hearing problems -kidney disease -recent or ongoing radiation therapy -an unusual or allergic reaction to carboplatin, cisplatin, other chemotherapy, other medicines, foods, dyes, or preservatives -pregnant or trying to get pregnant -breast-feeding How should I use this medicine? This drug is usually given as an infusion into a vein. It is administered in a hospital or clinic by a specially trained health care professional. Talk to your pediatrician regarding the use of this medicine in children. Special care may be needed. Overdosage: If you think you have taken too much of this medicine contact a poison control center or emergency room at once. NOTE: This medicine is only for you. Do not share this medicine with others. What if I miss a dose? It is important not to miss a dose. Call your doctor or health care professional if you are unable to keep an appointment. What may interact with this medicine? -medicines for seizures -medicines to increase blood counts like filgrastim, pegfilgrastim, sargramostim -some antibiotics like amikacin, gentamicin, neomycin, streptomycin, tobramycin -vaccines Talk to  your doctor or health care professional before taking any of these medicines: -acetaminophen -aspirin -ibuprofen -ketoprofen -naproxen This list may not describe all possible interactions. Give your health care provider a list of all the medicines, herbs, non-prescription drugs, or dietary supplements you use. Also tell them if you smoke, drink alcohol, or use illegal drugs. Some items may interact with your medicine. What should I watch for while using this medicine? Your condition will be monitored carefully while you are receiving this medicine. You will need important blood work done while you are taking this medicine. This drug may make you feel generally unwell. This is not uncommon, as chemotherapy can affect healthy cells as well as cancer cells. Report any side effects. Continue your course of treatment even though you feel ill unless your doctor tells you to stop. In some cases, you may be given additional medicines to help with side effects. Follow all directions for their use. Call your doctor or health care professional for advice if you get a fever, chills or sore throat, or other symptoms of a cold or flu. Do not treat yourself. This drug decreases your body's ability to fight infections. Try to avoid being around people who are sick. This medicine may increase your risk to bruise or bleed. Call your doctor or health care professional if you notice any unusual bleeding. Be careful brushing and flossing your teeth or using a toothpick because you may get an infection or bleed more easily. If you have any dental work done,   tell your dentist you are receiving this medicine. Avoid taking products that contain aspirin, acetaminophen, ibuprofen, naproxen, or ketoprofen unless instructed by your doctor. These medicines may hide a fever. Do not become pregnant while taking this medicine. Women should inform their doctor if they wish to become pregnant or think they might be pregnant. There is a  potential for serious side effects to an unborn child. Talk to your health care professional or pharmacist for more information. Do not breast-feed an infant while taking this medicine. What side effects may I notice from receiving this medicine? Side effects that you should report to your doctor or health care professional as soon as possible: -allergic reactions like skin rash, itching or hives, swelling of the face, lips, or tongue -signs of infection - fever or chills, cough, sore throat, pain or difficulty passing urine -signs of decreased platelets or bleeding - bruising, pinpoint red spots on the skin, black, tarry stools, nosebleeds -signs of decreased red blood cells - unusually weak or tired, fainting spells, lightheadedness -breathing problems -changes in hearing -changes in vision -chest pain -high blood pressure -low blood counts - This drug may decrease the number of white blood cells, red blood cells and platelets. You may be at increased risk for infections and bleeding. -nausea and vomiting -pain, swelling, redness or irritation at the injection site -pain, tingling, numbness in the hands or feet -problems with balance, talking, walking -trouble passing urine or change in the amount of urine Side effects that usually do not require medical attention (report to your doctor or health care professional if they continue or are bothersome): -hair loss -loss of appetite -metallic taste in the mouth or changes in taste This list may not describe all possible side effects. Call your doctor for medical advice about side effects. You may report side effects to FDA at 1-800-FDA-1088. Where should I keep my medicine? This drug is given in a hospital or clinic and will not be stored at home. NOTE: This sheet is a summary. It may not cover all possible information. If you have questions about this medicine, talk to your doctor, pharmacist, or health care provider.  2018 Elsevier/Gold  Standard (2008-01-07 14:38:05)   

## 2018-03-04 NOTE — Telephone Encounter (Signed)
Outgoing call to patient per Dr Alvy Bimler, notified pt that insurance did not cover injection so instructed pt neutropenic precautions, careful handwashing with soap and water, avoid crowds, and reporting signs of infection ie fever, chills. Pt voiced understanding. She said she will call her insurance as well.

## 2018-03-04 NOTE — Progress Notes (Signed)
Insurance denies GCSF coverage Will remove from her chemo plan

## 2018-03-05 ENCOUNTER — Telehealth: Payer: Self-pay

## 2018-03-05 NOTE — Telephone Encounter (Signed)
Faxed prior authorization form with last office note to [facility] for Udenyca to [phone].

## 2018-03-05 NOTE — Telephone Encounter (Signed)
Called to see how she is doing after her treatment yesterday. She is doing well with no problems. Instructed to call office for questions or concerns.

## 2018-03-05 NOTE — Telephone Encounter (Signed)
-----   Message from Murvin Natal, RN sent at 03/04/2018  2:21 PM EDT ----- Regarding: pt of dr Alvy Bimler first time chemo follow up call Pt of dr Alvy Bimler first time taxol/carboplatin  Follow up call.  Pt tolerated treatment well.

## 2018-03-06 ENCOUNTER — Inpatient Hospital Stay: Payer: Medicare HMO

## 2018-03-06 ENCOUNTER — Telehealth: Payer: Self-pay

## 2018-03-06 ENCOUNTER — Telehealth: Payer: Self-pay | Admitting: Oncology

## 2018-03-06 NOTE — Telephone Encounter (Signed)
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  She took 1.5 tablets of hydrocodone/actemaninophen this morning but is concerned about constipation. She is taking Miralax, Senakot and a stool softener.  She is wondering if this is normal after having chemotherapy and if there is anything else she can take besides hydrocodone.

## 2018-03-06 NOTE — Telephone Encounter (Signed)
The bone ache is from Taxol but should not last more than 3 days I do not recommend pursuing GCSF injection because it can exacerbate bone pain I recommend hydrocodone for now and continue taking all aggressive laxatives while on hydrocodone

## 2018-03-06 NOTE — Telephone Encounter (Signed)
Patient advised of message below.  She verbalized understanding and agreement. 

## 2018-03-06 NOTE — Telephone Encounter (Signed)
[employer] called and left message to call them regarding prior authorization on Udenyca. Reference# [id number].  They have additional questions.  Called back. They asked, were would she be getting the injection:? Told them at [facility]. No further questions.

## 2018-03-07 ENCOUNTER — Encounter: Payer: Self-pay | Admitting: Obstetrics

## 2018-03-07 ENCOUNTER — Encounter: Payer: Self-pay | Admitting: Hematology and Oncology

## 2018-03-07 ENCOUNTER — Other Ambulatory Visit: Payer: Self-pay | Admitting: Hematology and Oncology

## 2018-03-07 ENCOUNTER — Inpatient Hospital Stay (HOSPITAL_BASED_OUTPATIENT_CLINIC_OR_DEPARTMENT_OTHER): Payer: Medicare HMO | Admitting: Obstetrics

## 2018-03-07 ENCOUNTER — Telehealth: Payer: Self-pay | Admitting: Oncology

## 2018-03-07 VITALS — BP 129/73 | HR 84 | Temp 98.3°F | Resp 18 | Ht 64.5 in | Wt 173.7 lb

## 2018-03-07 DIAGNOSIS — R112 Nausea with vomiting, unspecified: Secondary | ICD-10-CM

## 2018-03-07 DIAGNOSIS — T451X5D Adverse effect of antineoplastic and immunosuppressive drugs, subsequent encounter: Secondary | ICD-10-CM

## 2018-03-07 DIAGNOSIS — Z5111 Encounter for antineoplastic chemotherapy: Secondary | ICD-10-CM | POA: Diagnosis not present

## 2018-03-07 DIAGNOSIS — Z9889 Other specified postprocedural states: Principal | ICD-10-CM

## 2018-03-07 DIAGNOSIS — C562 Malignant neoplasm of left ovary: Secondary | ICD-10-CM

## 2018-03-07 MED ORDER — OXYCODONE HCL 5 MG PO TABS: 5.0000 mg | ORAL_TABLET | ORAL | 0 refills | Status: DC | PRN

## 2018-03-07 MED ORDER — NAPROXEN 250 MG PO TABS: 250.0000 mg | ORAL_TABLET | Freq: Two times a day (BID) | ORAL | 2 refills | Status: DC

## 2018-03-07 NOTE — Patient Instructions (Signed)
1. RTC 3 months 2. Activity restrictions for 3 more weeks.

## 2018-03-07 NOTE — Progress Notes (Signed)
Met w/ pt to introduce myself as her Arboriculturist.  Unfortunately there aren't any foundations offering copay assistance for her Dx and the type of ins she has.  I offered the [employer], went over what it covers and gave her an expense sheet.  She wanted to apply and was approved for the $400 [place].  She has my card for any questions or concerns she may have in the future.

## 2018-03-07 NOTE — Progress Notes (Signed)
Progress Note: Gyn-Onc  Consult was initally requested by Dr. Matthew Saras   CC:  Chief Complaint  Patient presents with  . Malignant neoplasm of left ovary [facility])    HPI: [name] is a 77 y.o.    Interval History Since her last visit she has started chemotherapy (just this week). She is experiencing some joint pain from this and that is being treated with PRN narcotics. Still with a little constipation but she is able to regulate with medication.  We are also going to perform a vaginal cuff check as she is about 5 weeks from surgery today.  She asks me about options for the joint pain due to her chemotherapy and had some questions about the G-CSF that had been ordered.   Presenting History She was referred to [place] with an 8 cm complex pelvic mass and normal CA 125 of 11 on 01/09/18, no family history of a second-degree relative with breast cancer in first-degree relative with bladder cancer.  She has a history of hysterectomy in 1984.  Recommendations were for bilateral salpingo-oophorectomy with staging and debulking based on operative findings.  On her first visit with [place], Dr. Skeet Latch reviewed, the benefits of a minimally invasive approach which include short hospitalization with her return to function.  She is aware that if there is intraoperative adhesive disease or other intraoperative processes that may this procedure safe for by a laparotomy that would be performed.  She was counseled for robotic assisted bilateral salpingo-oophorectomy possible omentectomy lymph node dissection biopsies debulking and other indicated procedures including laparotomy.  Risks of the procedures discussed were infection bleeding damage to surrounding structures prolonged hospitalization and reoperation and DVT.   The surgery was placed on my schedule to help expedite the procedure. She was symptomatic with increased urinary frequency.  I did take her to surgery. Unfortunately the frozen section revealed  malignancy and so a laparotomy, TAH/BSO, staging was ultimately performed 01/31/2018. She did have a readmission for ileus which resolved. Otherwise recovery was fairly unremarkable.    Review of Systems:  Review of Systems  Gastrointestinal: Positive for constipation.  Musculoskeletal: Positive for arthralgias.  All other systems reviewed and are negative.     Current Meds:  Outpatient Encounter Medications as of 03/07/2018  Medication Sig  . ALPRAZolam (XANAX) 0.5 MG tablet Take 0.5 mg by mouth at bedtime.   Marland Kitchen amLODipine (NORVASC) 5 MG tablet Take 5 mg by mouth every morning.  Marland Kitchen atorvastatin (LIPITOR) 20 MG tablet Take 20 mg by mouth every evening.   . Cholecalciferol (VITAMIN D) 2000 units tablet Take 2,000 Units by mouth daily.  . Cyanocobalamin (B-12 COMPLIANCE INJECTION) 1000 MCG/ML KIT Inject 1,000 mcg as directed every 30 (thirty) days.  Marland Kitchen dexamethasone (DECADRON) 4 MG tablet Take 3 tabs the night before and 3 tabs the morning of chemotherapy, every 3 weeks, with food  . docusate sodium (COLACE) 100 MG capsule Take 200 mg by mouth daily as needed (severe constipation).  Marland Kitchen exenatide (BYETTA) 10 MCG/0.04ML SOPN injection Inject 10 mcg into the skin 2 (two) times daily.   . feeding supplement, GLUCERNA SHAKE, (GLUCERNA SHAKE) LIQD Take 237 mLs by mouth daily.  Marland Kitchen FLUoxetine (PROZAC) 20 MG capsule Take 20 mg by mouth every morning.  . hydrochlorothiazide (HYDRODIURIL) 25 MG tablet Take 25 mg by mouth every morning.  Marland Kitchen levothyroxine (SYNTHROID, LEVOTHROID) 100 MCG tablet Take 100 mcg by mouth daily before breakfast.  . lidocaine-prilocaine (EMLA) cream Apply to affected area once  .  losartan (COZAAR) 100 MG tablet Take 100 mg by mouth every morning.  . Melatonin 5 MG TABS Take 5-10 mg by mouth at bedtime.  . metFORMIN (GLUCOPHAGE-XR) 500 MG 24 hr tablet Take 1,000 mg by mouth 2 (two) times daily.  Marland Kitchen omeprazole (PRILOSEC OTC) 20 MG tablet Take 20 mg by mouth daily.  . ondansetron  (ZOFRAN) 4 MG tablet Take 1 tablet (4 mg total) by mouth every 8 (eight) hours as needed for nausea or vomiting.  . ondansetron (ZOFRAN) 8 MG tablet Take 1 tablet (8 mg total) by mouth 2 (two) times daily as needed for refractory nausea / vomiting. Start on day 3 after chemo.  Marland Kitchen oxyCODONE (OXY IR/ROXICODONE) 5 MG immediate release tablet Take 1 tablet (5 mg total) by mouth every 4 (four) hours as needed for moderate pain.  . potassium chloride (K-DUR,KLOR-CON) 10 MEQ tablet Take 10 mEq by mouth every evening.   . prochlorperazine (COMPAZINE) 10 MG tablet Take 1 tablet (10 mg total) by mouth every 6 (six) hours as needed (Nausea or vomiting).  . SENNA PO Take 3 tablets by mouth daily as needed (severe constipation).  . vitamin E 400 UNIT capsule Take 400 Units by mouth daily.  Marland Kitchen enoxaparin (LOVENOX) 40 MG/0.4ML injection Inject 0.4 mLs (40 mg total) into the skin daily for 26 days.  . naproxen (NAPROSYN) 250 MG tablet Take 1 tablet (250 mg total) by mouth 2 (two) times daily with a meal.   No facility-administered encounter medications on file as of 03/07/2018.     Allergy:  Allergies  Allergen Reactions  . Penicillins Anaphylaxis    Has patient had a PCN reaction causing immediate rash, facial/tongue/throat swelling, SOB or lightheadedness with hypotension: Yes Has patient had a PCN reaction causing severe rash involving mucus membranes or skin necrosis: No Has patient had a PCN reaction that required hospitalization: Yes Has patient had a PCN reaction occurring within the last 10 years: No If all of the above answers are "NO", then may proceed with Cephalosporin use.     Social Hx:   Social History   Socioeconomic History  . Marital status: Widowed    Spouse name: Not on file  . Number of children: 2  . Years of education: Not on file  . Highest education level: Not on file  Occupational History  . Occupation: retired Glass blower/designer  Social Needs  . Financial resource strain: Not  on file  . Food insecurity:    Worry: Not on file    Inability: Not on file  . Transportation needs:    Medical: Not on file    Non-medical: Not on file  Tobacco Use  . Smoking status: Never Smoker  . Smokeless tobacco: Never Used  Substance and Sexual Activity  . Alcohol use: No  . Drug use: No  . Sexual activity: Not on file  Lifestyle  . Physical activity:    Days per week: Not on file    Minutes per session: Not on file  . Stress: Not on file  Relationships  . Social connections:    Talks on phone: Not on file    Gets together: Not on file    Attends religious service: Not on file    Active member of club or organization: Not on file    Attends meetings of clubs or organizations: Not on file    Relationship status: Not on file  . Intimate partner violence:    Fear of current or ex partner:  Not on file    Emotionally abused: Not on file    Physically abused: Not on file    Forced sexual activity: Not on file  Other Topics Concern  . Not on file  Social History Narrative  . Not on file    Past Surgical Hx:  Past Surgical History:  Procedure Laterality Date  . BLADDER SUSPENSION  2002  . BREAST SURGERY     several benign cysts removed; surgeries from [place]   . CHOLECYSTECTOMY  1988  . EYE SURGERY  2010 amd 2012   cataracts removed bilateral   . IR FLUORO GUIDE PORT INSERTION RIGHT  02/28/2018  . IR US GUIDE VASC ACCESS RIGHT  02/28/2018  . KNEE ARTHROSCOPY Left    l knee x2  . PARATHYROIDECTOMY  2010  . ROBOTIC ASSISTED SALPINGO OOPHERECTOMY Bilateral 01/31/2018   Procedure: XI ROBOTIC ASSISTED BILATERAL SALPINGO OOPHORECTOMY, STAGING, LAPAROTOMY, PELVIC AND PARA AORTIC LYMPH NODE DISSECTION, OMENTECTOMY;  Surgeon: Isabel Caprice, MD;  Location: [facility];  Service: Gynecology;  Laterality: Bilateral;  . ROTATOR CUFF REPAIR  2005   right   . TOTAL KNEE ARTHROPLASTY Left 12/08/2014   Procedure: LEFT TOTAL KNEE ARTHROPLASTY;  Surgeon: Tobi Bastos, MD;   Location: [facility];  Service: Orthopedics;  Laterality: Left;  Marland Kitchen VAGINAL HYSTERECTOMY  1984    Past Medical Hx:  Past Medical History:  Diagnosis Date  . Arthritis   . Diabetes mellitus without complication ([place])    type 2   . GERD (gastroesophageal reflux disease)   . Hypertension   . Hypothyroidism   . Ovarian cancer ([place]) 01/31/2018  . Pneumonia    its been 4 years   . PONV (postoperative nausea and vomiting)   . Vitamin B 12 deficiency     Past Gynecological History: 2 prior to hysterectomy in 1984 received estrogen therapy from 1984 until 2000 No LMP recorded. Patient has had a hysterectomy.  Family Hx:  Family History  Problem Relation Age of Onset  . Lymphoma Mother   . Bladder Cancer Sister   . Prostate cancer Brother   . Leukemia Brother   . Breast cancer Other 89       niece    Vitals:  Blood pressure 129/73, pulse 84, temperature 98.3 F (36.8 C), temperature source Oral, resp. rate 18, height 5' 4.5" (1.638 m), weight 173 lb 11.2 oz (78.8 kg), SpO2 100 %.  Physical Exam:   ECOG PERFORMANCE STATUS: 1 - Symptomatic but completely ambulatory   General :  Well developed, 77 y.o., female in no apparent distress HEENT:  Normocephalic/atraumatic, symmetric, EOMI, eyelids normal Neck:   No visible masses.  Respiratory:  Respirations unlabored, no use of accessory muscles CV:   Deferred Breast:  Deferred Musculoskeletal: Normal muscle strength. Abdomen:  Wound is vertical and healed, abdomen is soft, NT Extremities:  No visible edema, nontender Skin:   Normal inspection I do note her port placement on the right clavicular area which appears to be healing Neuro/Psych:  No focal motor deficit, no abnormal mental status. Normal gait. Normal affect. Alert and oriented to person, place, and time  Pelvic: Vulva: Normal external female genitalia.  Bladder/urethra: Urethral meatus normal in size and location. No lesions or   masses, well supported bladder Speculum  exam: Vagina: No lesion, no discharge, no bleeding.  Vaginal cuff is healed with no evidence of wound separation Cervix: Surgically absent Bimanual exam: Intact vaginal cuff Uterus: Surgically absent  Adnexa: No masses. Rectovaginal:  Deferred    ASSESSMENT Ovarian cancer  PLAN 1. We reviewed her histology and stage last visit. She was given copies of these reports. 2. We reviewed the 62m pulmonary nodule and recommendation for followup imaging TBD.  I would recommend end of treatment scan which could include chest 3. Continue to follow with medical oncology for chemotherapy  4. RTC 3 months 5. Joint pain - offered hydrocodone with ibuprofen as alternative since Vicodin seemed to work better for her than oxycodone. She will keep the oxycodone Rx from Dr. GAlvy Bimlerfor now. 6. Lack of GCSF insurance coverage- provided reassurance    20  minutes of direct face to face counseling time was spent with the patient. This included discussion about prognosis, therapy recommendations including toxicity with chemotherapy that are beyond the scope of routine postoperative care.  SIsabel Caprice MD 03/11/2018, 2:34 PM   Cc (not today): RMolli Posey MD Referring OB/Gyn GGilford Rile MD AHarmon DunPCP

## 2018-03-07 NOTE — Telephone Encounter (Signed)
Called [name] and advised her that the Udenyca injection has been approved by [facility] and than an injection apt has been scheduled for tomorrow at 11:15.  Discussed her current joint aches and she knows to take oxycodone as directed.

## 2018-03-08 ENCOUNTER — Encounter: Payer: Self-pay | Admitting: Oncology

## 2018-03-08 ENCOUNTER — Inpatient Hospital Stay: Payer: Medicare HMO

## 2018-03-08 VITALS — HR 85 | Temp 98.3°F | Resp 20

## 2018-03-08 DIAGNOSIS — C562 Malignant neoplasm of left ovary: Secondary | ICD-10-CM

## 2018-03-08 DIAGNOSIS — Z5111 Encounter for antineoplastic chemotherapy: Secondary | ICD-10-CM | POA: Diagnosis not present

## 2018-03-08 MED ORDER — PEGFILGRASTIM-CBQV 6 MG/0.6ML ~~LOC~~ SOSY
PREFILLED_SYRINGE | SUBCUTANEOUS | Status: AC
  Filled 2018-03-08: qty 0.6

## 2018-03-08 MED ORDER — PEGFILGRASTIM-CBQV 6 MG/0.6ML ~~LOC~~ SOSY
6.0000 mg | PREFILLED_SYRINGE | Freq: Once | SUBCUTANEOUS | Status: AC
  Administered 2018-03-08: 6 mg via SUBCUTANEOUS

## 2018-03-08 NOTE — Progress Notes (Signed)
  Oncology Nurse Navigator Documentation  Navigator Location: [facility] (03/08/18 1000)   )Navigator Encounter Type: Telephone (03/08/18 1000) Telephone: Incoming Call (03/08/18 1000)                     Treatment Phase: Treatment (03/08/18 1000) Barriers/Navigation Needs: Education (03/08/18 1000) Education: Preparing for Upcoming Surgery/ Treatment (03/08/18 1000)       Education Method: Verbal (03/08/18 1000)      Acuity: Level 2 (03/08/18 1000)   Acuity Level 2: Ongoing guidance and education throughout treatment as needed (03/08/18 1000)     Time Spent with Patient: 15 (03/08/18 1000)    [name] called and said her joint pain is better today and will have her Udenyca injection.  She also said she took Claritin this morning.  She was advised to take it once a day for 3 days.  She verbalized agreement.

## 2018-03-08 NOTE — Patient Instructions (Signed)
Pegfilgrastim injection What is this medicine? PEGFILGRASTIM (PEG fil gra stim) is a long-acting granulocyte colony-stimulating factor that stimulates the growth of neutrophils, a type of white blood cell important in the body's fight against infection. It is used to reduce the incidence of fever and infection in patients with certain types of cancer who are receiving chemotherapy that affects the bone marrow, and to increase survival after being exposed to high doses of radiation. This medicine may be used for other purposes; ask your health care provider or pharmacist if you have questions. COMMON BRAND NAME(S): Neulasta What should I tell my health care provider before I take this medicine? They need to know if you have any of these conditions: -kidney disease -latex allergy -ongoing radiation therapy -sickle cell disease -skin reactions to acrylic adhesives (On-Body Injector only) -an unusual or allergic reaction to pegfilgrastim, filgrastim, other medicines, foods, dyes, or preservatives -pregnant or trying to get pregnant -breast-feeding How should I use this medicine? This medicine is for injection under the skin. If you get this medicine at home, you will be taught how to prepare and give the pre-filled syringe or how to use the On-body Injector. Refer to the patient Instructions for Use for detailed instructions. Use exactly as directed. Tell your healthcare provider immediately if you suspect that the On-body Injector may not have performed as intended or if you suspect the use of the On-body Injector resulted in a missed or partial dose. It is important that you put your used needles and syringes in a special sharps container. Do not put them in a trash can. If you do not have a sharps container, call your pharmacist or healthcare provider to get one. Talk to your pediatrician regarding the use of this medicine in children. While this drug may be prescribed for selected conditions,  precautions do apply. Overdosage: If you think you have taken too much of this medicine contact a poison control center or emergency room at once. NOTE: This medicine is only for you. Do not share this medicine with others. What if I miss a dose? It is important not to miss your dose. Call your doctor or health care professional if you miss your dose. If you miss a dose due to an On-body Injector failure or leakage, a new dose should be administered as soon as possible using a single prefilled syringe for manual use. What may interact with this medicine? Interactions have not been studied. Give your health care provider a list of all the medicines, herbs, non-prescription drugs, or dietary supplements you use. Also tell them if you smoke, drink alcohol, or use illegal drugs. Some items may interact with your medicine. This list may not describe all possible interactions. Give your health care provider a list of all the medicines, herbs, non-prescription drugs, or dietary supplements you use. Also tell them if you smoke, drink alcohol, or use illegal drugs. Some items may interact with your medicine. What should I watch for while using this medicine? You may need blood work done while you are taking this medicine. If you are going to need a MRI, CT scan, or other procedure, tell your doctor that you are using this medicine (On-Body Injector only). What side effects may I notice from receiving this medicine? Side effects that you should report to your doctor or health care professional as soon as possible: -allergic reactions like skin rash, itching or hives, swelling of the face, lips, or tongue -dizziness -fever -pain, redness, or irritation at site   where injected -pinpoint red spots on the skin -red or dark-brown urine -shortness of breath or breathing problems -stomach or side pain, or pain at the shoulder -swelling -tiredness -trouble passing urine or change in the amount of urine Side  effects that usually do not require medical attention (report to your doctor or health care professional if they continue or are bothersome): -bone pain -muscle pain This list may not describe all possible side effects. Call your doctor for medical advice about side effects. You may report side effects to FDA at 1-800-FDA-1088. Where should I keep my medicine? Keep out of the reach of children. Store pre-filled syringes in a refrigerator between 2 and 8 degrees C (36 and 46 degrees F). Do not freeze. Keep in carton to protect from light. Throw away this medicine if it is left out of the refrigerator for more than 48 hours. Throw away any unused medicine after the expiration date. NOTE: This sheet is a summary. It may not cover all possible information. If you have questions about this medicine, talk to your doctor, pharmacist, or health care provider.  2018 Elsevier/Gold Standard (2016-09-28 12:58:03)  

## 2018-03-11 ENCOUNTER — Encounter: Payer: Self-pay | Admitting: Obstetrics

## 2018-03-11 DIAGNOSIS — T451X5A Adverse effect of antineoplastic and immunosuppressive drugs, initial encounter: Secondary | ICD-10-CM | POA: Insufficient documentation

## 2018-03-11 HISTORY — DX: Adverse effect of antineoplastic and immunosuppressive drugs, initial encounter: T45.1X5A

## 2018-03-13 DIAGNOSIS — K5904 Chronic idiopathic constipation: Secondary | ICD-10-CM | POA: Insufficient documentation

## 2018-03-13 HISTORY — DX: Chronic idiopathic constipation: K59.04

## 2018-03-14 ENCOUNTER — Ambulatory Visit: Payer: Medicare HMO

## 2018-03-14 ENCOUNTER — Encounter: Payer: Self-pay | Admitting: Oncology

## 2018-03-14 ENCOUNTER — Telehealth: Payer: Self-pay | Admitting: Oncology

## 2018-03-14 NOTE — Telephone Encounter (Signed)
Thanks for the update

## 2018-03-14 NOTE — Telephone Encounter (Signed)
Called [name] to see how she is feeling after her injection on Friday.  She said she had a lot of aching pain over the weekend but is better now.  She said she has not had to take any oxycodone since Sunday and her bowels are now moving regularly.  She saw her PCP, Dr. Bea Graff yesterday and was given gabapentin 300 mg at HS to try for pain and also glimepiride 1 mg to take when her blood sugar is elevated when taking steroids for chemotherapy.

## 2018-03-18 ENCOUNTER — Telehealth: Payer: Self-pay | Admitting: Oncology

## 2018-03-18 NOTE — Telephone Encounter (Signed)
Yes, likely related to the growth factor injection or chemo or both I agree with OTC analgesics or pain medication if needed

## 2018-03-18 NOTE — Telephone Encounter (Signed)
[name] called and said she is having a lot of aching in the joints of her hands and fingers today and is wondering if it is from chemotherapy.  She took naproxen this morning which is helping some.  She said she has oxycodone to take if it gets worse.  She did mention that she stopped taking gabapentin because it was making her dizzy.  She said that she will try to start it again before her next chemo treatment.

## 2018-03-18 NOTE — Telephone Encounter (Signed)
Patient notified of message below.

## 2018-03-22 ENCOUNTER — Telehealth: Payer: Self-pay | Admitting: Oncology

## 2018-03-22 NOTE — Telephone Encounter (Signed)
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 She said it feels like having electric shots in her finger.  She is taking gabapentin 300 mg twice a day.  Recommended trying tea tree oil or Uderly Smooth that is suggested in the chemo class.  She said she will try them over the weekend.  She also said she has found a clinic in [place] that treats neuropathy with light therapy.  She is going to wait until after treatment to try this if she still has neuropathy.

## 2018-03-25 ENCOUNTER — Inpatient Hospital Stay: Payer: Medicare HMO

## 2018-03-25 ENCOUNTER — Telehealth: Payer: Self-pay | Admitting: Hematology and Oncology

## 2018-03-25 ENCOUNTER — Inpatient Hospital Stay: Payer: Medicare HMO | Attending: Gynecologic Oncology

## 2018-03-25 ENCOUNTER — Inpatient Hospital Stay: Payer: Medicare HMO | Admitting: Hematology and Oncology

## 2018-03-25 ENCOUNTER — Encounter: Payer: Self-pay | Admitting: Oncology

## 2018-03-25 ENCOUNTER — Encounter: Payer: Self-pay | Admitting: Hematology and Oncology

## 2018-03-25 DIAGNOSIS — E1140 Type 2 diabetes mellitus with diabetic neuropathy, unspecified: Secondary | ICD-10-CM | POA: Insufficient documentation

## 2018-03-25 DIAGNOSIS — Z79899 Other long term (current) drug therapy: Secondary | ICD-10-CM

## 2018-03-25 DIAGNOSIS — G629 Polyneuropathy, unspecified: Secondary | ICD-10-CM

## 2018-03-25 DIAGNOSIS — M2550 Pain in unspecified joint: Secondary | ICD-10-CM | POA: Diagnosis not present

## 2018-03-25 DIAGNOSIS — K5900 Constipation, unspecified: Secondary | ICD-10-CM

## 2018-03-25 DIAGNOSIS — Z5111 Encounter for antineoplastic chemotherapy: Secondary | ICD-10-CM

## 2018-03-25 DIAGNOSIS — C562 Malignant neoplasm of left ovary: Secondary | ICD-10-CM

## 2018-03-25 DIAGNOSIS — C786 Secondary malignant neoplasm of retroperitoneum and peritoneum: Secondary | ICD-10-CM | POA: Insufficient documentation

## 2018-03-25 DIAGNOSIS — Z7984 Long term (current) use of oral hypoglycemic drugs: Secondary | ICD-10-CM | POA: Diagnosis not present

## 2018-03-25 DIAGNOSIS — G620 Drug-induced polyneuropathy: Secondary | ICD-10-CM

## 2018-03-25 DIAGNOSIS — D701 Agranulocytosis secondary to cancer chemotherapy: Secondary | ICD-10-CM | POA: Diagnosis not present

## 2018-03-25 DIAGNOSIS — M898X9 Other specified disorders of bone, unspecified site: Secondary | ICD-10-CM | POA: Insufficient documentation

## 2018-03-25 DIAGNOSIS — E1151 Type 2 diabetes mellitus with diabetic peripheral angiopathy without gangrene: Secondary | ICD-10-CM

## 2018-03-25 DIAGNOSIS — T451X5A Adverse effect of antineoplastic and immunosuppressive drugs, initial encounter: Secondary | ICD-10-CM

## 2018-03-25 DIAGNOSIS — K5909 Other constipation: Secondary | ICD-10-CM

## 2018-03-25 HISTORY — DX: Adverse effect of antineoplastic and immunosuppressive drugs, initial encounter: T45.1X5A

## 2018-03-25 HISTORY — DX: Drug-induced polyneuropathy: G62.0

## 2018-03-25 LAB — CMP (CANCER CENTER ONLY)
ALT: 14 U/L (ref 0–55)
AST: 14 U/L (ref 5–34)
Albumin: 4 g/dL (ref 3.5–5.0)
Alkaline Phosphatase: 61 U/L (ref 40–150)
Anion gap: 12 — ABNORMAL HIGH (ref 3–11)
BILIRUBIN TOTAL: 0.4 mg/dL (ref 0.2–1.2)
BUN: 15 mg/dL (ref 7–26)
CHLORIDE: 101 mmol/L (ref 98–109)
CO2: 23 mmol/L (ref 22–29)
CREATININE: 0.69 mg/dL (ref 0.60–1.10)
Calcium: 9.9 mg/dL (ref 8.4–10.4)
GFR, Est AFR Am: >60 mL/min (ref >60)
Glucose, Bld: 149 mg/dL — ABNORMAL HIGH (ref 70–140)
Potassium: 4.1 mmol/L (ref 3.5–5.1)
Sodium: 136 mmol/L (ref 136–145)
Total Protein: 6.8 g/dL (ref 6.4–8.3)

## 2018-03-25 LAB — CBC WITH DIFFERENTIAL (CANCER CENTER ONLY)
Basophils Absolute: 0 10*3/uL (ref 0.0–0.1)
Basophils Relative: 0 %
EOS ABS: 0 10*3/uL (ref 0.0–0.5)
EOS PCT: 0 %
HCT: 35.2 % (ref 34.8–46.6)
Hemoglobin: 11.9 g/dL (ref 11.6–15.9)
LYMPHS ABS: 0.7 10*3/uL — AB (ref 0.9–3.3)
Lymphocytes Relative: 5 %
MCH: 29.9 pg (ref 25.1–34.0)
MCHC: 33.7 g/dL (ref 31.5–36.0)
MCV: 88.6 fL (ref 79.5–101.0)
Monocytes Absolute: 0.1 10*3/uL (ref 0.1–0.9)
Monocytes Relative: 0 %
Neutro Abs: 14.1 10*3/uL — ABNORMAL HIGH (ref 1.5–6.5)
Neutrophils Relative %: 95 %
PLATELETS: 196 10*3/uL (ref 145–400)
RBC: 3.97 MIL/uL (ref 3.70–5.45)
RDW: 16.4 % — AB (ref 11.2–14.5)
WBC: 14.9 10*3/uL — AB (ref 3.9–10.3)

## 2018-03-25 MED ORDER — DIPHENHYDRAMINE HCL 50 MG/ML IJ SOLN
50.0000 mg | Freq: Once | INTRAMUSCULAR | Status: AC
  Administered 2018-03-25: 50 mg via INTRAVENOUS

## 2018-03-25 MED ORDER — PALONOSETRON HCL INJECTION 0.25 MG/5ML
0.2500 mg | Freq: Once | INTRAVENOUS | Status: AC
  Administered 2018-03-25: 0.25 mg via INTRAVENOUS

## 2018-03-25 MED ORDER — CARBOPLATIN CHEMO INJECTION 600 MG/60ML
510.0000 mg | Freq: Once | INTRAVENOUS | Status: AC
  Administered 2018-03-25: 510 mg via INTRAVENOUS
  Filled 2018-03-25: qty 51

## 2018-03-25 MED ORDER — SODIUM CHLORIDE 0.9% FLUSH
10.0000 mL | Freq: Once | INTRAVENOUS | Status: AC
  Administered 2018-03-25: 10 mL
  Filled 2018-03-25: qty 10

## 2018-03-25 MED ORDER — HEPARIN SOD (PORK) LOCK FLUSH 100 UNIT/ML IV SOLN
500.0000 [IU] | Freq: Once | INTRAVENOUS | Status: AC | PRN
  Administered 2018-03-25: 500 [IU]
  Filled 2018-03-25: qty 5

## 2018-03-25 MED ORDER — FAMOTIDINE IN NACL 20-0.9 MG/50ML-% IV SOLN
20.0000 mg | Freq: Once | INTRAVENOUS | Status: AC
  Administered 2018-03-25: 20 mg via INTRAVENOUS

## 2018-03-25 MED ORDER — SODIUM CHLORIDE 0.9 % IV SOLN
Freq: Once | INTRAVENOUS | Status: AC
  Administered 2018-03-25: 10:00:00 via INTRAVENOUS

## 2018-03-25 MED ORDER — PALONOSETRON HCL INJECTION 0.25 MG/5ML
INTRAVENOUS | Status: AC
  Filled 2018-03-25: qty 5

## 2018-03-25 MED ORDER — DIPHENHYDRAMINE HCL 50 MG/ML IJ SOLN
INTRAMUSCULAR | Status: AC
Start: 2018-03-25 — End: ?
  Filled 2018-03-25: qty 1

## 2018-03-25 MED ORDER — SODIUM CHLORIDE 0.9 % IV SOLN
140.0000 mg/m2 | Freq: Once | INTRAVENOUS | Status: AC
  Administered 2018-03-25: 270 mg via INTRAVENOUS
  Filled 2018-03-25: qty 45

## 2018-03-25 MED ORDER — SODIUM CHLORIDE 0.9% FLUSH
10.0000 mL | INTRAVENOUS | Status: DC | PRN
  Administered 2018-03-25: 10 mL
  Filled 2018-03-25: qty 10

## 2018-03-25 MED ORDER — FOSAPREPITANT DIMEGLUMINE INJECTION 150 MG
Freq: Once | INTRAVENOUS | Status: AC
  Administered 2018-03-25: 11:00:00 via INTRAVENOUS
  Filled 2018-03-25: qty 5

## 2018-03-25 MED ORDER — FAMOTIDINE IN NACL 20-0.9 MG/50ML-% IV SOLN
INTRAVENOUS | Status: AC
Start: 2018-03-25 — End: ?
  Filled 2018-03-25: qty 50

## 2018-03-25 NOTE — Assessment & Plan Note (Signed)
She had recent bone pain secondary to G-CSF She will continue conservative management with analgesics as needed

## 2018-03-25 NOTE — Patient Instructions (Signed)
[facility] Discharge Instructions for Patients Receiving Chemotherapy  Today you received the following chemotherapy agents taxol/carboplatin  To help prevent nausea and vomiting after your treatment, we encourage you to take your nausea medication as directed   If you develop nausea and vomiting that is not controlled by your nausea medication, call the clinic.   BELOW ARE SYMPTOMS THAT SHOULD BE REPORTED IMMEDIATELY:  *FEVER GREATER THAN 100.5 F  *CHILLS WITH OR WITHOUT FEVER  NAUSEA AND VOMITING THAT IS NOT CONTROLLED WITH YOUR NAUSEA MEDICATION  *UNUSUAL SHORTNESS OF BREATH  *UNUSUAL BRUISING OR BLEEDING  TENDERNESS IN MOUTH AND THROAT WITH OR WITHOUT PRESENCE OF ULCERS  *URINARY PROBLEMS  *BOWEL PROBLEMS  UNUSUAL RASH Items with * indicate a potential emergency and should be followed up as soon as possible.  Feel free to call the clinic you have any questions or concerns. The clinic phone number is [phone].  

## 2018-03-25 NOTE — Assessment & Plan Note (Addendum)
Overall, she tolerated treatment well with expected side effects of therapy We will proceed with treatment with minor dose adjustment of taxol

## 2018-03-25 NOTE — Assessment & Plan Note (Signed)
She has severe worsening constipation recently due to chemotherapy We have extensive discussion about chronic laxative therapy The patient is instructed to call me if she had no bowel movement for over 2 days We discussed the importance of avoidance use of suppository or enema if possible

## 2018-03-25 NOTE — Assessment & Plan Note (Signed)
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 I plan to reduce the dose of treatment as outlined above.  I explained to the patient the rationale of this strategy and reassured the patient it would not compromise the efficacy of treatment I recommend she continues taking gabapentin and pain medicine as needed

## 2018-03-25 NOTE — Telephone Encounter (Signed)
Gave patient avs and calendar of upcoming July appointments.  °

## 2018-03-25 NOTE — Assessment & Plan Note (Signed)
She had recent severe hyperglycemia due to steroid treatment If she have no reaction to treatment today, I recommend reduced dose oral premedication further to 8 mg in the future She does not need insulin therapy She will continue dietary modification

## 2018-03-25 NOTE — Progress Notes (Signed)
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 I plan to reduce the dose of treatment as outlined above.  I explained to the patient the rationale of this strategy and reassured the patient it would not compromise the efficacy of treatment I recommend she continues taking gabapentin and pain medicine as needed  Bone pain due to G-CSF She had recent bone pain secondary to G-CSF She will continue conservative management with analgesics as needed  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) She had recent severe hyperglycemia due to steroid treatment If she have no reaction to treatment today, I recommend reduced dose oral premedication further to 8 mg in the future She does not need insulin therapy She will continue dietary modification  Other constipation She has severe worsening constipation recently due to chemotherapy We have extensive discussion about chronic laxative therapy The patient is instructed to call me if she had no bowel movement for over 2 days We discussed the importance of avoidance use of suppository or enema if possible   No orders of the defined types were placed in this encounter.   INTERVAL HISTORY: Please see below for problem oriented charting. 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 - TUMOR INVOLVES OVARY SURFACE AND LEFT FALLOPIAN TUBE. - SEE ONCOLOGY TABLE. 2. Mesentery, small bowel mesentery biopsy #1 - BENIGN FIBROADIPOSE TISSUE. 3. Ovary and fallopian tube, right - BENIGN OVARY WITH INCLUSION CYSTS. - BENIGN FALLOPIAN TUBE WITH PARATUBAL CYSTS AND ADENOFIBROMA. 4. Omentum, resection for tumor - BENIGN ADIPOSE TISSUE. 5. Peritoneum, biopsy, left diaphragmatic - BENIGN PERITONEAL TYPE TISSUE. 6. Peritoneum, biopsy, right diaphragmatic - BENIGN PERITONEAL TYPE TISSUE. 7. Mesentery, small bowel mesenteric biopsy #2 - BENIGN FIBROADIPOSE TISSUE. 8. Lymph nodes, regional resection, right pelvic - FIVE OF FIVE LYMPH NODES NEGATIVE FOR CARCINOMA (0/5). 9. Lymph nodes, regional resection, left pelvic - FOUR OF FOUR LYMPH NODES NEGATIVE FOR CARCINOMA (0/4). 10. Peritoneum, biopsy, left gutter - BENIGN PERITONEAL TYPE TISSUE. 11. Peritoneum, biopsy, bladder - BENIGN PERITONEAL TYPE TISSUE WITH ACUTE INFLAMMATION AND CALCIFICATIONS.  12. Peritoneum, biopsy, cul-de-sac - BENIGN PERITONEAL TYPE TISSUE WITH ACUTE INFLAMMATION AND CALCIFICATIONS. 13. Soft tissue, biopsy, right gutter - BENIGN FIBROADIPOSE TISSUE. 14. Lymph node, biopsy, right para-aortic - TWO OF TWO LYMPH NODES NEGATIVE FOR CARCINOMA (0/2). 15. Lymph node, biopsy, left para-aortic - ONE OF ONE LYMPH  NODES NEGATIVE FOR CARCINOMA  (0/1). Microscopic Comment 1. OVARY Specimen(s): Left ovary and fallopian tube. Procedure: (including lymph node sampling): Bilateral salpingo-oophorectomy with omental and peritoneal biopsies and lymph node biopsies. Primary tumor site (including laterality): Left ovary. Ovarian surface involvement: Present. Ovarian capsule intact without fragmentation: Intact. Maximum tumor size (cm): 11 cm total. Histologic type: Serous cystadenocarcinoma. Grade: High grade (low grade areas also present). Peritoneal implants: (specify invasive or non-invasive): N/A. Pelvic extension (list additional structures on separate lines and if involved): Left fallopian tube. Lymph nodes: number examined 12 ; number positive 0 TNM code: pT2a, pN0, pMX FIGO Stage (based on pathologic findings, needs clinical correlation): IIA Comments: None.      01/31/2018 Pathology Results    PERITONEAL WASHING (SPECIMEN 1 OF 1 COLLECTED 01/31/18): MALIGNANT CELLS PRESENT CONSISTENT WITH METASTATIC CARCINOMA.      01/31/2018 Surgery    Procedure(s) Performed:  1. Robotic BSO and washings. 2. Exploratory laparotomy, Staging including infragastric omentectomy, Bilateral pelvic and paraaortic lymphadenectomy, pertioneal biopsies.  Specimens: Bilateral tubes / ovaries, bilateral pelvic and paraaortic lymph nodes, peritoneal biopsies, washings and omentum.  Operative Findings: The left adnexa was adherent to the left pelvic peritoneum suspected due to the inferior retraction from the vaginal hysterectomy. Intraoperative leakage of cystic fluid from left ovary.  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  The right adnexa grossly appeared normal although slightly enlarged for her age.  There were some indurated lymph nodes but no overtly malignant lymph nodes were suspected.  Small bowel mesentery had 3-4 superficial possible implants.  1 of these was sent for frozen section returned  benign.  She did have adhesive disease from her open cholecystectomy in the right upper quadrant.  No other evidence of disease in the abdomen or pelvis on palpation; specifically including the small bowel stomach and large bowel       01/31/2018 Genetic Testing    Patient has genetic testing done for BRCA mutation on tumor sample Results revealed patient has no mutation      02/05/2018 Imaging    CT scan of abdomen 1. 16 mm exophytic lesion upper pole right kidney has attenuation too high to be a simple cyst. This may be a cyst complicated by proteinaceous debris or hemorrhage, but renal cell carcinoma is a concern. Routine outpatient follow-up MRI of the abdomen without and with contrast recommended after resolution of patient's acute symptoms (so she is better able to participate with positioning and breath holding). 2. Mild edema/possible minimal hemorrhage in the extraperitoneal soft tissues of the pelvic for tracking up both pelvic sidewalls. Imaging appearance is not outside of the spectrum of findings expected 6 days after the reported surgery. Trace interloop mesenteric and free fluid is identified in the peritoneal cavity in there is some mild edema in the omentum. There is no organized or rim enhancing collection to suggest evolving abscess. 3. Gas in the extraperitoneal soft tissues of the left lower quadrant is associated with gas in the subcutaneous fat of the lower left anterior abdominal wall. This is not unexpected on postoperative day 6. No evidence for intraperitoneal free air. 4. No CT features to suggest small bowel obstruction. Oral contrast material has migrated about halfway through the small bowel loops but there is no differential distention of proximal versus distal small bowel. The colon is diffusely distended and fluid-filled proximally, but formed stool is noted in the distal colon. Given apparent slow migration of contrast and fluid-filled small and large  bowel loops, a  component of ileus is possible.      02/14/2018 Imaging    MR abdomen Benign Bosniak category 2 cyst in upper pole of right kidney, corresponding with lesion seen on previous CT. No evidence of renal neoplasm or other significant abnormality.       02/18/2018 Cancer Staging    Staging form: Ovary, Fallopian Tube, and Primary Peritoneal Carcinoma, AJCC 8th Edition - Pathologic: Stage II (pT2, pN0, cM0) - Signed by Heath Lark, MD on 02/18/2018      02/28/2018 Procedure    Status post right IJ port catheter placement. Catheter ready for use       REVIEW OF SYSTEMS:   Constitutional: Denies fevers, chills or abnormal weight loss Eyes: Denies blurriness of vision Ears, nose, mouth, throat, and face: Denies mucositis or sore throat Respiratory: Denies cough, dyspnea or wheezes Cardiovascular: Denies palpitation, chest discomfort or lower extremity swelling Skin: Denies abnormal skin rashes Lymphatics: Denies new lymphadenopathy or easy bruising Behavioral/Psych: Mood is stable, no new changes  All other systems were reviewed with the patient and are negative.  I have reviewed the past medical history, past surgical history, social history and family history with the patient and they are unchanged from previous note.  ALLERGIES:  is allergic to penicillins.  MEDICATIONS:  Current Outpatient Medications  Medication Sig Dispense Refill  . gabapentin (NEURONTIN) 300 MG capsule Take 300 mg by mouth 2 (two) times daily.    Marland Kitchen ALPRAZolam (XANAX) 0.5 MG tablet Take 0.5 mg by mouth at bedtime.   5  . amLODipine (NORVASC) 5 MG tablet Take 5 mg by mouth every morning.    Marland Kitchen atorvastatin (LIPITOR) 20 MG tablet Take 20 mg by mouth every evening.     . Cholecalciferol (VITAMIN D) 2000 units tablet Take 2,000 Units by mouth daily.    . Cyanocobalamin (B-12 COMPLIANCE INJECTION) 1000 MCG/ML KIT Inject 1,000 mcg as directed every 30 (thirty) days.    Marland Kitchen dexamethasone (DECADRON) 4 MG tablet Take 3 tabs  the night before and 3 tabs the morning of chemotherapy, every 3 weeks, with food 60 tablet 0  . docusate sodium (COLACE) 100 MG capsule Take 200 mg by mouth daily as needed (severe constipation).    Marland Kitchen exenatide (BYETTA) 10 MCG/0.04ML SOPN injection Inject 10 mcg into the skin 2 (two) times daily.     . feeding supplement, GLUCERNA SHAKE, (GLUCERNA SHAKE) LIQD Take 237 mLs by mouth daily.    Marland Kitchen FLUoxetine (PROZAC) 20 MG capsule Take 20 mg by mouth every morning.    Marland Kitchen FLUOXETINE HCL PO Take 20 mg by mouth daily.    . hydrochlorothiazide (HYDRODIURIL) 25 MG tablet Take 25 mg by mouth every morning.    Marland Kitchen levothyroxine (SYNTHROID, LEVOTHROID) 100 MCG tablet Take 100 mcg by mouth daily before breakfast.    . lidocaine-prilocaine (EMLA) cream Apply to affected area once 30 g 3  . losartan (COZAAR) 100 MG tablet Take 100 mg by mouth every morning.    . Melatonin 5 MG TABS Take 5-10 mg by mouth at bedtime.    . metFORMIN (GLUCOPHAGE-XR) 500 MG 24 hr tablet Take 1,000 mg by mouth 2 (two) times daily.    . naproxen (NAPROSYN) 250 MG tablet Take 1 tablet (250 mg total) by mouth 2 (two) times daily with a meal. 60 tablet 2  . omeprazole (PRILOSEC OTC) 20 MG tablet Take 20 mg by mouth daily.    . ondansetron (ZOFRAN) 4 MG tablet Take  1 tablet (4 mg total) by mouth every 8 (eight) hours as needed for nausea or vomiting. 20 tablet 0  . ondansetron (ZOFRAN) 8 MG tablet Take 1 tablet (8 mg total) by mouth 2 (two) times daily as needed for refractory nausea / vomiting. Start on day 3 after chemo. 30 tablet 1  . oxyCODONE (OXY IR/ROXICODONE) 5 MG immediate release tablet Take 1 tablet (5 mg total) by mouth every 4 (four) hours as needed for moderate pain. 60 tablet 0  . potassium chloride (K-DUR,KLOR-CON) 10 MEQ tablet Take 10 mEq by mouth every evening.     . prochlorperazine (COMPAZINE) 10 MG tablet Take 1 tablet (10 mg total) by mouth every 6 (six) hours as needed (Nausea or vomiting). 30 tablet 1  . SENNA PO Take  3 tablets by mouth daily as needed (severe constipation).    . vitamin E 400 UNIT capsule Take 400 Units by mouth daily.     No current facility-administered medications for this visit.    Facility-Administered Medications Ordered in Other Visits  Medication Dose Route Frequency Provider Last Rate Last Dose  . CARBOplatin (PARAPLATIN) 510 mg in sodium chloride 0.9 % 250 mL chemo infusion  510 mg Intravenous Once Alvy Bimler, Teretha Chalupa, MD      . heparin lock flush 100 unit/mL  500 Units Intracatheter Once PRN Alvy Bimler, Dorothee Napierkowski, MD      . PACLitaxel (TAXOL) 270 mg in sodium chloride 0.9 % 250 mL chemo infusion (> 34m/m2)  140 mg/m2 (Treatment Plan Recorded) Intravenous Once GAlvy Bimler Trini Christiansen, MD 98 mL/hr at 03/25/18 1126 270 mg at 03/25/18 1126  . sodium chloride flush (NS) 0.9 % injection 10 mL  10 mL Intracatheter PRN GAlvy Bimler Panhia Karl, MD        PHYSICAL EXAMINATION: ECOG PERFORMANCE STATUS: 1 - Symptomatic but completely ambulatory  Vitals:   03/25/18 0922  BP: 140/63  Pulse: 79  Resp: 18  Temp: 97.8 F (36.6 C)  SpO2: 98%   Filed Weights   03/25/18 0922  Weight: 176 lb 3.2 oz (79.9 kg)    GENERAL:alert, no distress and comfortable SKIN: skin color, texture, turgor are normal, no rashes or significant lesions EYES: normal, Conjunctiva are pink and non-injected, sclera clear OROPHARYNX:no exudate, no erythema and lips, buccal mucosa, and tongue normal  NECK: supple, thyroid normal size, non-tender, without nodularity LYMPH:  no palpable lymphadenopathy in the cervical, axillary or inguinal LUNGS: clear to auscultation and percussion with normal breathing effort HEART: regular rate & rhythm and no murmurs and no lower extremity edema ABDOMEN:abdomen soft, non-tender and normal bowel sounds Musculoskeletal:no cyanosis of digits and no clubbing  NEURO: alert & oriented x 3 with fluent speech, no focal motor/sensory deficits  LABORATORY DATA:  I have reviewed the data as listed    Component Value  Date/Time   NA 136 03/25/2018 0825   K 4.1 03/25/2018 0825   CL 101 03/25/2018 0825   CO2 23 03/25/2018 0825   GLUCOSE 149 (H) 03/25/2018 0825   BUN 15 03/25/2018 0825   CREATININE 0.69 03/25/2018 0825   CALCIUM 9.9 03/25/2018 0825   PROT 6.8 03/25/2018 0825   ALBUMIN 4.0 03/25/2018 0825   AST 14 03/25/2018 0825   ALT 14 03/25/2018 0825   ALKPHOS 61 03/25/2018 0825   BILITOT 0.4 03/25/2018 0825   GFRNONAA >60 03/25/2018 0825   GFRAA >60 03/25/2018 0825    No results found for: SPEP, UPEP  Lab Results  Component Value Date   WBC 14.9 (H) 03/25/2018  NEUTROABS 14.1 (H) 03/25/2018   HGB 11.9 03/25/2018   HCT 35.2 03/25/2018   MCV 88.6 03/25/2018   PLT 196 03/25/2018      Chemistry      Component Value Date/Time   NA 136 03/25/2018 0825   K 4.1 03/25/2018 0825   CL 101 03/25/2018 0825   CO2 23 03/25/2018 0825   BUN 15 03/25/2018 0825   CREATININE 0.69 03/25/2018 0825      Component Value Date/Time   CALCIUM 9.9 03/25/2018 0825   ALKPHOS 61 03/25/2018 0825   AST 14 03/25/2018 0825   ALT 14 03/25/2018 0825   BILITOT 0.4 03/25/2018 0825       RADIOGRAPHIC STUDIES: I have personally reviewed the radiological images as listed and agreed with the findings in the report. Ir US Guide Vasc Access Right  Result Date: 02/28/2018 INDICATION: 77 year old female with a history of left ovarian neoplasm, referred for port catheter placement EXAM: IMPLANTED PORT A CATH PLACEMENT WITH ULTRASOUND AND FLUOROSCOPIC GUIDANCE MEDICATIONS: 1 g vancomycin; The antibiotic was administered within an appropriate time interval prior to skin puncture. ANESTHESIA/SEDATION: Moderate (conscious) sedation was employed during this procedure. A total of Versed 1.5 mg and Fentanyl 75 mcg was administered intravenously. Moderate Sedation Time: 18 minutes. The patient's level of consciousness and vital signs were monitored continuously by radiology nursing throughout the procedure under my direct  supervision. FLUOROSCOPY TIME:  0 minutes, 6 seconds (0.5 mGy) COMPLICATIONS: None PROCEDURE: The procedure, risks, benefits, and alternatives were explained to the patient. Questions regarding the procedure were encouraged and answered. The patient understands and consents to the procedure. Ultrasound survey was performed with images stored and sent to PACs. The right neck and chest was prepped with chlorhexidine, and draped in the usual sterile fashion using maximum barrier technique (cap and mask, sterile gown, sterile gloves, large sterile sheet, hand hygiene and cutaneous antiseptic). Antibiotic prophylaxis was provided with 2.0g Ancef administered IV one hour prior to skin incision. Local anesthesia was attained by infiltration with 1% lidocaine without epinephrine. Ultrasound demonstrated patency of the right internal jugular vein, and this was documented with an image. Under real-time ultrasound guidance, this vein was accessed with a 21 gauge micropuncture needle and image documentation was performed. A small dermatotomy was made at the access site with an 11 scalpel. A 0.018" wire was advanced into the SVC and used to estimate the length of the internal catheter. The access needle exchanged for a 35F micropuncture vascular sheath. The 0.018" wire was then removed and a 0.035" wire advanced into the IVC. An appropriate location for the subcutaneous reservoir was selected below the clavicle and an incision was made through the skin and underlying soft tissues. The subcutaneous tissues were then dissected using a combination of blunt and sharp surgical technique and a pocket was formed. A single lumen power injectable portacatheter was then tunneled through the subcutaneous tissues from the pocket to the dermatotomy and the port reservoir placed within the subcutaneous pocket. The venous access site was then serially dilated and a peel away vascular sheath placed over the wire. The wire was removed and the  port catheter advanced into position under fluoroscopic guidance. The catheter tip is positioned in the cavoatrial junction. This was documented with a spot image. The portacatheter was then tested and found to flush and aspirate well. The port was flushed with saline followed by 100 units/mL heparinized saline. The pocket was then closed in two layers using first subdermal inverted interrupted absorbable sutures  followed by a running subcuticular suture. The epidermis was then sealed with Dermabond. The dermatotomy at the venous access site was also seal with Dermabond. Patient tolerated the procedure well and remained hemodynamically stable throughout. No complications encountered and no significant blood loss encountered IMPRESSION: Status post right IJ port catheter placement. Catheter ready for use. Signed, Dulcy Fanny. Earleen Newport, DO [facility] Radiology Electronically Signed   By: Corrie Mckusick D.O.   On: 02/28/2018 13:53   Dg Abd 2 Views  Result Date: 03/01/2018 CLINICAL DATA:  Constipation EXAM: ABDOMEN - 2 VIEW COMPARISON:  Abdominal MRI 02/14/2018 FINDINGS: Mild-to-moderate stool most confluent about the splenic flexure. No obstruction or rectal impaction. No concerning mass effect or gas collection. Lung bases are clear. IMPRESSION: No generalized stool retention or rectal impaction. Overall normal bowel gas pattern. Electronically Signed   By: Monte Fantasia M.D.   On: 03/01/2018 16:36   Ir Fluoro Guide Port Insertion Right  Result Date: 02/28/2018 INDICATION: 77 year old female with a history of left ovarian neoplasm, referred for port catheter placement EXAM: IMPLANTED PORT A CATH PLACEMENT WITH ULTRASOUND AND FLUOROSCOPIC GUIDANCE MEDICATIONS: 1 g vancomycin; The antibiotic was administered within an appropriate time interval prior to skin puncture. ANESTHESIA/SEDATION: Moderate (conscious) sedation was employed during this procedure. A total of  Versed 1.5 mg and Fentanyl 75 mcg was administered intravenously. Moderate Sedation Time: 18 minutes. The patient's level of consciousness and vital signs were monitored continuously by radiology nursing throughout the procedure under my direct supervision. FLUOROSCOPY TIME:  0 minutes, 6 seconds (0.5 mGy) COMPLICATIONS: None PROCEDURE: The procedure, risks, benefits, and alternatives were explained to the patient. Questions regarding the procedure were encouraged and answered. The patient understands and consents to the procedure. Ultrasound survey was performed with images stored and sent to PACs. The right neck and chest was prepped with chlorhexidine, and draped in the usual sterile fashion using maximum barrier technique (cap and mask, sterile gown, sterile gloves, large sterile sheet, hand hygiene and cutaneous antiseptic). Antibiotic prophylaxis was provided with 2.0g Ancef administered IV one hour prior to skin incision. Local anesthesia was attained by infiltration with 1% lidocaine without epinephrine. Ultrasound demonstrated patency of the right internal jugular vein, and this was documented with an image. Under real-time ultrasound guidance, this vein was accessed with a 21 gauge micropuncture needle and image documentation was performed. A small dermatotomy was made at the access site with an 11 scalpel. A 0.018" wire was advanced into the SVC and used to estimate the length of the internal catheter. The access needle exchanged for a 15F micropuncture vascular sheath. The 0.018" wire was then removed and a 0.035" wire advanced into the IVC. An appropriate location for the subcutaneous reservoir was selected below the clavicle and an incision was made through the skin and underlying soft tissues. The subcutaneous tissues were then dissected using a combination of blunt and sharp surgical technique and a pocket was formed. A single lumen power injectable portacatheter was then tunneled through the  subcutaneous tissues from the pocket to the dermatotomy and the port reservoir placed within the subcutaneous pocket. The venous access site was then serially dilated and a peel away vascular sheath placed over the wire. The wire was removed and the port catheter advanced into position under fluoroscopic guidance. The catheter tip is positioned in the cavoatrial junction. This was documented with a spot image. The portacatheter was then tested and found to flush and aspirate well. The port was flushed  with saline followed by 100 units/mL heparinized saline. The pocket was then closed in two layers using first subdermal inverted interrupted absorbable sutures followed by a running subcuticular suture. The epidermis was then sealed with Dermabond. The dermatotomy at the venous access site was also seal with Dermabond. Patient tolerated the procedure well and remained hemodynamically stable throughout. No complications encountered and no significant blood loss encountered IMPRESSION: Status post right IJ port catheter placement. Catheter ready for use. Signed, Dulcy Fanny. Earleen Newport, DO [facility] Radiology Electronically Signed   By: Corrie Mckusick D.O.   On: 02/28/2018 13:53    All questions were answered. The patient knows to call the clinic with any problems, questions or concerns. No barriers to learning was detected.  I spent 30 minutes counseling the patient face to face. The total time spent in the appointment was 40 minutes and more than 50% was on counseling and review of test results  Heath Lark, MD 03/25/2018 1:56 PM

## 2018-03-27 ENCOUNTER — Inpatient Hospital Stay: Payer: Medicare HMO

## 2018-03-27 DIAGNOSIS — C562 Malignant neoplasm of left ovary: Secondary | ICD-10-CM

## 2018-03-27 DIAGNOSIS — Z5111 Encounter for antineoplastic chemotherapy: Secondary | ICD-10-CM | POA: Diagnosis not present

## 2018-03-27 MED ORDER — PEGFILGRASTIM-CBQV 6 MG/0.6ML ~~LOC~~ SOSY
6.0000 mg | PREFILLED_SYRINGE | Freq: Once | SUBCUTANEOUS | Status: AC
  Administered 2018-03-27: 6 mg via SUBCUTANEOUS

## 2018-03-27 MED ORDER — PEGFILGRASTIM-CBQV 6 MG/0.6ML ~~LOC~~ SOSY
PREFILLED_SYRINGE | SUBCUTANEOUS | Status: AC
  Filled 2018-03-27: qty 0.6

## 2018-03-27 NOTE — Patient Instructions (Signed)
Pegfilgrastim injection What is this medicine? PEGFILGRASTIM (PEG fil gra stim) is a long-acting granulocyte colony-stimulating factor that stimulates the growth of neutrophils, a type of white blood cell important in the body's fight against infection. It is used to reduce the incidence of fever and infection in patients with certain types of cancer who are receiving chemotherapy that affects the bone marrow, and to increase survival after being exposed to high doses of radiation. This medicine may be used for other purposes; ask your health care provider or pharmacist if you have questions. COMMON BRAND NAME(S): Neulasta What should I tell my health care provider before I take this medicine? They need to know if you have any of these conditions: -kidney disease -latex allergy -ongoing radiation therapy -sickle cell disease -skin reactions to acrylic adhesives (On-Body Injector only) -an unusual or allergic reaction to pegfilgrastim, filgrastim, other medicines, foods, dyes, or preservatives -pregnant or trying to get pregnant -breast-feeding How should I use this medicine? This medicine is for injection under the skin. If you get this medicine at home, you will be taught how to prepare and give the pre-filled syringe or how to use the On-body Injector. Refer to the patient Instructions for Use for detailed instructions. Use exactly as directed. Tell your healthcare provider immediately if you suspect that the On-body Injector may not have performed as intended or if you suspect the use of the On-body Injector resulted in a missed or partial dose. It is important that you put your used needles and syringes in a special sharps container. Do not put them in a trash can. If you do not have a sharps container, call your pharmacist or healthcare provider to get one. Talk to your pediatrician regarding the use of this medicine in children. While this drug may be prescribed for selected conditions,  precautions do apply. Overdosage: If you think you have taken too much of this medicine contact a poison control center or emergency room at once. NOTE: This medicine is only for you. Do not share this medicine with others. What if I miss a dose? It is important not to miss your dose. Call your doctor or health care professional if you miss your dose. If you miss a dose due to an On-body Injector failure or leakage, a new dose should be administered as soon as possible using a single prefilled syringe for manual use. What may interact with this medicine? Interactions have not been studied. Give your health care provider a list of all the medicines, herbs, non-prescription drugs, or dietary supplements you use. Also tell them if you smoke, drink alcohol, or use illegal drugs. Some items may interact with your medicine. This list may not describe all possible interactions. Give your health care provider a list of all the medicines, herbs, non-prescription drugs, or dietary supplements you use. Also tell them if you smoke, drink alcohol, or use illegal drugs. Some items may interact with your medicine. What should I watch for while using this medicine? You may need blood work done while you are taking this medicine. If you are going to need a MRI, CT scan, or other procedure, tell your doctor that you are using this medicine (On-Body Injector only). What side effects may I notice from receiving this medicine? Side effects that you should report to your doctor or health care professional as soon as possible: -allergic reactions like skin rash, itching or hives, swelling of the face, lips, or tongue -dizziness -fever -pain, redness, or irritation at site   where injected -pinpoint red spots on the skin -red or dark-brown urine -shortness of breath or breathing problems -stomach or side pain, or pain at the shoulder -swelling -tiredness -trouble passing urine or change in the amount of urine Side  effects that usually do not require medical attention (report to your doctor or health care professional if they continue or are bothersome): -bone pain -muscle pain This list may not describe all possible side effects. Call your doctor for medical advice about side effects. You may report side effects to FDA at 1-800-FDA-1088. Where should I keep my medicine? Keep out of the reach of children. Store pre-filled syringes in a refrigerator between 2 and 8 degrees C (36 and 46 degrees F). Do not freeze. Keep in carton to protect from light. Throw away this medicine if it is left out of the refrigerator for more than 48 hours. Throw away any unused medicine after the expiration date. NOTE: This sheet is a summary. It may not cover all possible information. If you have questions about this medicine, talk to your doctor, pharmacist, or health care provider.  2018 Elsevier/Gold Standard (2016-09-28 12:58:03)  

## 2018-03-28 ENCOUNTER — Telehealth: Payer: Self-pay | Admitting: Oncology

## 2018-03-28 NOTE — Telephone Encounter (Signed)
Advised her that taking 2 oxyodone 5 mg tablets q 4 hours prn is OK per [name].  She verbalized agreement and said she also took a naproxen tablet today and is feeling a little better.

## 2018-03-28 NOTE — Telephone Encounter (Addendum)
Notified patient of message below.  She verbalized agreement and then asked if she could take 2 of the oxycodone 5 mg tablets at time because she is having lower back spasms.  She said she would not do this all the time, just when the pain is severe.

## 2018-03-28 NOTE — Telephone Encounter (Signed)
We can discuss NOT doing the injection in her next visit For now, continue pain medication as needed Avoid enema if possible

## 2018-03-28 NOTE — Telephone Encounter (Signed)
[name] called and said she had the injection yesterday.  She is having a lot of aching today and said her stomach is hurting.  She is taking Claritin, oxycodone, gabapentin and constipation medications (miralax, Senakot and miralax).  She said she had a bm this morning after using a fleets enema last night.  She was reading on the internet that the injection will cause more pain if your WBC count is up.  She is wondering if she needs the injection each time if her counts are up.

## 2018-04-01 ENCOUNTER — Inpatient Hospital Stay (HOSPITAL_BASED_OUTPATIENT_CLINIC_OR_DEPARTMENT_OTHER): Payer: Medicare HMO | Admitting: Genetics

## 2018-04-01 ENCOUNTER — Inpatient Hospital Stay: Payer: Medicare HMO

## 2018-04-01 DIAGNOSIS — Z800 Family history of malignant neoplasm of digestive organs: Secondary | ICD-10-CM | POA: Diagnosis not present

## 2018-04-01 DIAGNOSIS — C562 Malignant neoplasm of left ovary: Secondary | ICD-10-CM

## 2018-04-01 DIAGNOSIS — Z803 Family history of malignant neoplasm of breast: Secondary | ICD-10-CM

## 2018-04-01 DIAGNOSIS — Z8052 Family history of malignant neoplasm of bladder: Secondary | ICD-10-CM | POA: Diagnosis not present

## 2018-04-01 DIAGNOSIS — Z5111 Encounter for antineoplastic chemotherapy: Secondary | ICD-10-CM | POA: Diagnosis not present

## 2018-04-01 DIAGNOSIS — Z8042 Family history of malignant neoplasm of prostate: Secondary | ICD-10-CM | POA: Diagnosis not present

## 2018-04-01 DIAGNOSIS — Z801 Family history of malignant neoplasm of trachea, bronchus and lung: Secondary | ICD-10-CM | POA: Diagnosis not present

## 2018-04-01 LAB — CBC WITH DIFFERENTIAL (CANCER CENTER ONLY)
BASOS ABS: 0.1 10*3/uL (ref 0.0–0.1)
Basophils Relative: 0 %
EOS ABS: 0.6 10*3/uL — AB (ref 0.0–0.5)
EOS PCT: 4 %
HCT: 36.3 % (ref 34.8–46.6)
Hemoglobin: 11.8 g/dL (ref 11.6–15.9)
Lymphocytes Relative: 17 %
Lymphs Abs: 2.4 10*3/uL (ref 0.9–3.3)
MCH: 30.1 pg (ref 25.1–34.0)
MCHC: 32.5 g/dL (ref 31.5–36.0)
MCV: 92.6 fL (ref 79.5–101.0)
MONO ABS: 2 10*3/uL — AB (ref 0.1–0.9)
Monocytes Relative: 13 %
Neutro Abs: 9.6 10*3/uL — ABNORMAL HIGH (ref 1.5–6.5)
Neutrophils Relative %: 66 %
PLATELETS: 158 10*3/uL (ref 145–400)
RBC: 3.92 MIL/uL (ref 3.70–5.45)
RDW: 16.9 % — AB (ref 11.2–14.5)
WBC: 14.7 10*3/uL — AB (ref 3.9–10.3)

## 2018-04-01 LAB — CMP (CANCER CENTER ONLY)
ALBUMIN: 4 g/dL (ref 3.5–5.0)
ALT: 13 U/L (ref 0–55)
AST: 17 U/L (ref 5–34)
Alkaline Phosphatase: 86 U/L (ref 40–150)
Anion gap: 8 (ref 3–11)
BUN: 14 mg/dL (ref 7–26)
CO2: 29 mmol/L (ref 22–29)
Calcium: 10.2 mg/dL (ref 8.4–10.4)
Chloride: 101 mmol/L (ref 98–109)
Creatinine: 0.71 mg/dL (ref 0.60–1.10)
GFR, Est AFR Am: >60 mL/min (ref >60)
GLUCOSE: 118 mg/dL (ref 70–140)
POTASSIUM: 4.6 mmol/L (ref 3.5–5.1)
Sodium: 138 mmol/L (ref 136–145)
TOTAL PROTEIN: 6.6 g/dL (ref 6.4–8.3)
Total Bilirubin: 0.3 mg/dL (ref 0.2–1.2)

## 2018-04-02 ENCOUNTER — Encounter: Payer: Self-pay | Admitting: Genetics

## 2018-04-02 DIAGNOSIS — Z8042 Family history of malignant neoplasm of prostate: Secondary | ICD-10-CM | POA: Insufficient documentation

## 2018-04-02 NOTE — Progress Notes (Signed)
REFERRING PROVIDER: Heath Lark, MD [place], [place] [address]  PRIMARY PROVIDER:  Raina Mina., MD  PRIMARY REASON FOR VISIT:  1. Malignant neoplasm of left ovary (HCC)   2. Family history of prostate cancer   3. Family history of bladder cancer   4. Family history of lung cancer   5. Family history of esophageal cancer   6. Family history of breast cancer     HISTORY OF PRESENT ILLNESS:   Ms. Spelman, a 77 y.o. female, was seen for a [place] cancer genetics consultation at the request of [name] due to a personal and family history of cancer.  Ms. Stamos presents to clinic today to discuss the possibility of a hereditary predisposition to cancer, genetic testing, and to further clarify her future cancer risks, as well as potential cancer risks for family members.   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  She underwent robotic BSO and washings/exploratory laparotomy.  She is now undergoing chemotherapy.  She had genetic testing for the BRCA1 gene only on her tumor that was negative for a mutation. She has not had full HRD analysis performed yet on her tumor.    CANCER HISTORY:  Oncology History   Neg BRCA test on tumor     Malignant neoplasm of left ovary ([place])   01/31/2018 Pathology Results    1. 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 - TUMOR INVOLVES OVARY SURFACE AND LEFT FALLOPIAN TUBE. - SEE ONCOLOGY TABLE. 2. Mesentery, small bowel mesentery biopsy #1 - BENIGN FIBROADIPOSE TISSUE. 3. Ovary and fallopian tube, right - BENIGN OVARY WITH INCLUSION CYSTS. - BENIGN FALLOPIAN TUBE WITH PARATUBAL CYSTS AND ADENOFIBROMA. 4. Omentum, resection for tumor - BENIGN ADIPOSE TISSUE. 5. Peritoneum, biopsy, left diaphragmatic - BENIGN PERITONEAL TYPE TISSUE. 6. Peritoneum, biopsy, right diaphragmatic - BENIGN PERITONEAL  TYPE TISSUE. 7. Mesentery, small bowel mesenteric biopsy #2 - BENIGN FIBROADIPOSE TISSUE. 8. Lymph nodes, regional resection, right pelvic - FIVE OF FIVE LYMPH NODES NEGATIVE FOR CARCINOMA (0/5). 9. Lymph nodes, regional resection, left pelvic - FOUR OF FOUR LYMPH NODES NEGATIVE FOR CARCINOMA (0/4). 10. Peritoneum, biopsy, left gutter - BENIGN PERITONEAL TYPE TISSUE. 11. Peritoneum, biopsy, bladder - BENIGN PERITONEAL TYPE TISSUE WITH ACUTE INFLAMMATION AND CALCIFICATIONS.  12. Peritoneum, biopsy, cul-de-sac - BENIGN PERITONEAL TYPE TISSUE WITH ACUTE INFLAMMATION AND CALCIFICATIONS. 13. Soft tissue, biopsy, right gutter - BENIGN FIBROADIPOSE TISSUE. 14. Lymph node, biopsy, right para-aortic - TWO OF TWO LYMPH NODES NEGATIVE FOR CARCINOMA (0/2). 15. Lymph node, biopsy, left para-aortic - ONE OF ONE LYMPH NODES NEGATIVE FOR CARCINOMA (0/1). Microscopic Comment 1. OVARY Specimen(s): Left ovary and fallopian tube. Procedure: (including lymph node sampling): Bilateral salpingo-oophorectomy with omental and peritoneal biopsies and lymph node biopsies. Primary tumor site (including laterality): Left ovary. Ovarian surface involvement: Present. Ovarian capsule intact without fragmentation: Intact. Maximum tumor size (cm): 11 cm total. Histologic type: Serous cystadenocarcinoma. Grade: High grade (low grade areas also present). Peritoneal implants: (specify invasive or non-invasive): N/A. Pelvic extension (list additional structures on separate lines and if involved): Left fallopian tube. Lymph nodes: number examined 12 ; number positive 0 TNM code: pT2a, pN0, pMX FIGO Stage (based on pathologic findings, needs clinical correlation): IIA Comments: None.      01/31/2018 Pathology Results    PERITONEAL WASHING (SPECIMEN 1 OF 1 COLLECTED 01/31/18): MALIGNANT CELLS PRESENT CONSISTENT WITH METASTATIC CARCINOMA.  01/31/2018 Surgery    Procedure(s) Performed:  1. Robotic BSO and  washings. 2. Exploratory laparotomy, Staging including infragastric omentectomy, Bilateral pelvic and paraaortic lymphadenectomy, pertioneal biopsies.  Specimens: Bilateral tubes / ovaries, bilateral pelvic and paraaortic lymph nodes, peritoneal biopsies, washings and omentum.  Operative Findings: The left adnexa was adherent to the left pelvic peritoneum suspected due to the inferior retraction from the vaginal hysterectomy. Intraoperative leakage of cystic fluid from left ovary.  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  The right adnexa grossly appeared normal although slightly enlarged for her age.  There were some indurated lymph nodes but no overtly malignant lymph nodes were suspected.  Small bowel mesentery had 3-4 superficial possible implants.  1 of these was sent for frozen section returned benign.  She did have adhesive disease from her open cholecystectomy in the right upper quadrant.  No other evidence of disease in the abdomen or pelvis on palpation; specifically including the small bowel stomach and large bowel       01/31/2018 Genetic Testing    Patient has genetic testing done for BRCA mutation on tumor sample Results revealed patient has no mutation      02/05/2018 Imaging    CT scan of abdomen 1. 16 mm exophytic lesion upper pole right kidney has attenuation too high to be a simple cyst. This may be a cyst complicated by proteinaceous debris or hemorrhage, but renal cell carcinoma is a concern. Routine outpatient follow-up MRI of the abdomen without and with contrast recommended after resolution of patient's acute symptoms (so she is better able to participate with positioning and breath holding). 2. Mild edema/possible minimal hemorrhage in the extraperitoneal soft tissues of the pelvic for tracking up both pelvic sidewalls. Imaging appearance is not outside of the spectrum of findings expected 6 days after the reported surgery.  Trace interloop mesenteric and free fluid is identified in the peritoneal cavity in there is some mild edema in the omentum. There is no organized or rim enhancing collection to suggest evolving abscess. 3. Gas in the extraperitoneal soft tissues of the left lower quadrant is associated with gas in the subcutaneous fat of the lower left anterior abdominal wall. This is not unexpected on postoperative day 6. No evidence for intraperitoneal free air. 4. No CT features to suggest small bowel obstruction. Oral contrast material has migrated about halfway through the small bowel loops but there is no differential distention of proximal versus distal small bowel. The colon is diffusely distended and fluid-filled proximally, but formed stool is noted in the distal colon. Given apparent slow migration of contrast and fluid-filled small and large bowel loops, a component of ileus is possible.      02/14/2018 Imaging    MR abdomen Benign Bosniak category 2 cyst in upper pole of right kidney, corresponding with lesion seen on previous CT. No evidence of renal neoplasm or other significant abnormality.       02/18/2018 Cancer Staging    Staging form: Ovary, Fallopian Tube, and Primary Peritoneal Carcinoma, AJCC 8th Edition - Pathologic: Stage II (pT2, pN0, cM0) - Signed by Heath Lark, MD on 02/18/2018      02/28/2018 Procedure    Status post right IJ port catheter placement. Catheter ready for use       HORMONAL RISK FACTORS:  First live birth at age 52.  Ovaries intact: no.  Menopausal status: postmenopausal.  Colonoscopy: yes; reports she had colonoscopies every 3-5 years since age 72.  She reports she has had a couple of precancerous polyps every time.  She estimates she has had about 10 precancerous colon polyps.  past colonocsopy reports are unavailable for review at this time. . Mammogram within the last year: yes. Number of breast biopsies: Shereports having alot of cysts before she went through  menopause, reports none were masses.  Past Medical History:  Diagnosis Date  . Arthritis   . Diabetes mellitus without complication ([place])    type 2   . Family history of bladder cancer   . Family history of breast cancer   . Family history of esophageal cancer   . Family history of lung cancer   . Family history of prostate cancer   . GERD (gastroesophageal reflux disease)   . Hypertension   . Hypothyroidism   . Ovarian cancer ([place]) 01/31/2018  . Pneumonia    its been 4 years   . PONV (postoperative nausea and vomiting)   . Vitamin B 12 deficiency     Past Surgical History:  Procedure Laterality Date  . BLADDER SUSPENSION  2002  . BREAST SURGERY     several benign cysts removed; surgeries from [place]   . CHOLECYSTECTOMY  1988  . EYE SURGERY  2010 amd 2012   cataracts removed bilateral   . IR FLUORO GUIDE PORT INSERTION RIGHT  02/28/2018  . IR US GUIDE VASC ACCESS RIGHT  02/28/2018  . KNEE ARTHROSCOPY Left    l knee x2  . PARATHYROIDECTOMY  2010  . ROBOTIC ASSISTED SALPINGO OOPHERECTOMY Bilateral 01/31/2018   Procedure: XI ROBOTIC ASSISTED BILATERAL SALPINGO OOPHORECTOMY, STAGING, LAPAROTOMY, PELVIC AND PARA AORTIC LYMPH NODE DISSECTION, OMENTECTOMY;  Surgeon: Isabel Caprice, MD;  Location: [facility];  Service: Gynecology;  Laterality: Bilateral;  . ROTATOR CUFF REPAIR  2005   right   . TOTAL KNEE ARTHROPLASTY Left 12/08/2014   Procedure: LEFT TOTAL KNEE ARTHROPLASTY;  Surgeon: Tobi Bastos, MD;  Location: [facility];  Service: Orthopedics;  Laterality: Left;  Marland Kitchen VAGINAL HYSTERECTOMY  1984    Social History   Socioeconomic History  . Marital status: Widowed    Spouse name: Not on file  . Number of children: 2  . Years of education: Not on file  . Highest education level: Not on file  Occupational History  . Occupation: retired Glass blower/designer  Social Needs  . Financial resource strain: Not on file  . Food insecurity:    Worry: Not on file    Inability: Not on  file  . Transportation needs:    Medical: Not on file    Non-medical: Not on file  Tobacco Use  . Smoking status: Never Smoker  . Smokeless tobacco: Never Used  Substance and Sexual Activity  . Alcohol use: No  . Drug use: No  . Sexual activity: Not on file  Lifestyle  . Physical activity:    Days per week: Not on file    Minutes per session: Not on file  . Stress: Not on file  Relationships  . Social connections:    Talks on phone: Not on file    Gets together: Not on file    Attends religious service: Not on file    Active member of club or organization: Not on file    Attends meetings of clubs or organizations: Not on file    Relationship status: Not on file  Other Topics Concern  . Not on file  Social History Narrative  . Not on file  FAMILY HISTORY:  We obtained a detailed, 4-generation family history.  Significant diagnoses are listed below: Family History  Problem Relation Age of Onset  . Lymphoma Mother 48  . Bladder Cancer Sister 72  . Prostate cancer Brother 71       has surgery right away after dx  . Leukemia Brother        68's  . Breast cancer Other 71       niece, GT 2017 reportedly neg  . Prostate cancer Father 61       found on autopsy at death (57)  . Lung cancer Maternal Aunt   . Esophageal cancer Maternal Uncle   . Other Maternal Grandfather 75       cerebral hemmhorage  . Lung cancer Cousin   . Lung cancer Cousin   . Leukemia Cousin    Ms. Rivard has a 62 year-old daughter and a 68 year-old son with no history of cancer.  Ms. Wollin has 4 grandchildren ages 33, 15, 85, and 54.   Ms. Fehring has a sister who is in her 48's and was just diagnosed with bladder cancer at 38.  She has a daughter (pateint's niece) who developed breast cancer at the age of 45.  She reportedly had genetic testing in 2017 that was negative.  Ms. Beste has a brother who had prostate cancer at 12.  He had surgery right away to remove it, and then developed leukemia.   He died shortly after due to leukemia. He had a daughter who is deceased with no history of cancer.    Ms. Achenbach's father: was murdered at 50.  Upon autopsy they found he had prostate cancer.  Paternal Aunts/Uncles: 2 paternal aunts, no history of cancer. Both died >50 due to stroke/diabetes/ age related disease.  Paternal cousins: no history of cancer.  Paternal grandfather: died in his 66's with no history of cancer.  Paternal grandmother: died in her 35's with no history of cancer.   Ms. Kopinski mother: died at 88.  She had lymphoma dx at about 78.  She had a hysterectomy in her 70's.  Maternal Aunts/Uncles: 1 maternal aunt died of lung cancer. 2 maternal uncles, 1 had esophageal cancer. Maternal cousins: 2 maternal cousins had lung cancer, 1 maternal cousin had leukemia.  Maternal grandfather: died of a cerebral hemorrhage in his 55's.  Maternal grandmother:died in her 73's with no history of cancer.   Patient's maternal ancestors are of [place] descent, and paternal ancestors are of [place] descent. There is no reported Ashkenazi Jewish ancestry. There is no known consanguinity.  Ms. Medellin reports that on her maternal grandfather's side of the family 3 siblings married 3 siblings of another family-no consanguinity, but she has some 'double cousins' on this side of the family.    GENETIC COUNSELING ASSESSMENT: SHULAMIS WENBERG is a 77 y.o. female with a personal and family history which is somewhat suggestive of a Hereditary Cancer Predisposition Syndrome. We, therefore, discussed and recommended the following at today's visit.   DISCUSSION: We reviewed the characteristics, features and inheritance patterns of hereditary cancer syndromes. We also discussed genetic testing, including the appropriate family members to test, the process of testing, insurance coverage and turn-around-time for results. We discussed the implications of a negative, positive and/or variant of uncertain significant result.  We recommended Ms. Thwaites pursue genetic testing for the TumorNextHRD + Cancer Next gene panel.   The CancerNext gene panel offered by [facility] includes sequencing and rearrangement analysis for  the following 34 genes:   APC, ATM, BARD1, BMPR1A, BRCA1, BRCA2, BRIP1, CDH1, CDK4, CDKN2A, CHEK2, DICER1, EPCAM, GREM1, HOXB13MLH1, MRE11A, MSH2, MSH6, MUTYH, NBN, NF1, PALB2, PMS2, POLD1, POLE, PTEN, RAD50, RAD51D, SMAD4, SMARCA4, STK11, and TP53.   We discussed that about 20-25% of ovarian cancers are associated with a Hereditary cancer predisposition syndrome.  One of the most common hereditary cancer syndromes that increases  ovarian cancer risk is called Hereditary Breast and Ovarian Cancer (HBOC) syndrome.  This syndrome is caused by mutations in the BRCA1 and BRCA2 genes.  This syndrome increases an individual's lifetime risk to develop breast, ovarian, pancreatic, and other types of cancer.  There are also many other cancer predisposition syndromes caused by mutations in several other genes.  We discussed that if she is found to have a mutation in one of these genes, it may impact future medical management recommendations such as increased cancer screenings and consideration of risk reducing surgeries.  A positive result could also have implications for the patient's family members.  A Negative result would mean we were unable to identify a hereditary component to her cancer, but does not rule out the possibility of a hereditary basis for her cancer.  There could be mutations that are undetectable by current technology, or in genes not yet tested or identified to increase cancer risk.    We discussed that genetic testing through [facility] will test for hereditary mutations that could explain her diagnosis of cancer. However, homologous recombination testing (HRD) is genetic testing performed on her tumor that can determine genetic changes that could influence her management.  HRD testing is  performed in tandem with genetic testing, and typically at no additional cost.   We discussed the potential to find a Variant of Uncertain Significance or VUS.  These are variants that have not yet been identified as pathogenic or benign, and it is unknown if this variant is associated with increased cancer risk or if this is a normal finding.  Most VUS's are reclassified to benign or likely benign.   It should not be used to make medical management decisions. With time, we suspect the lab will determine the significance of any VUS's identified if any.   Based on Ms. Kapler's personal and family history of cancer, she meets NCCN medical criteria for genetic testing. Despite that she meets criteria, she may still have an out of pocket cost. The laboratory can confirm the estimated OOP cost with her prior to proceeding with testing.   PLAN: After considering the risks, benefits, and limitations, Ms. Gregg  provided informed consent to pursue genetic testing and the blood sample was sent to [facility] for analysis of the TumorNext HRD + CancerNext test. Results should be available within approximately 4-6 weeks' time, at which point they will be disclosed by telephone to Ms. Navarra, as will any additional recommendations warranted by these results. Ms. Saperstein will receive a summary of her genetic counseling visit and a copy of her results once available. This information will also be available in Epic. We encouraged Ms. Poblano to remain in contact with cancer genetics annually so that we can continuously update the family history and inform her of any changes in cancer genetics and testing that may be of benefit for her family. Ms. Tegethoff questions were answered to her satisfaction today. Our contact information was provided should additional questions or concerns arise.  Lastly, we encouraged Ms. Defina to remain in contact with cancer genetics annually so that we  can continuously update  the family history and inform her of any changes in cancer genetics and testing that may be of benefit for this family.   Ms.  Oyer questions were answered to her satisfaction today. Our contact information was provided should additional questions or concerns arise. Thank you for the referral and allowing [place] to share in the care of your patient.   Tana Felts, MS, [facility] Certified Genetic Counselor [email or web address] phone: [phone]  The patient was seen for a total of 40 minutes in face-to-face genetic counseling.  The patient was accompanied today by her daughter.  This patient was discussed with Drs. Magrinat, Lindi Adie and/or Burr Medico who agrees with the above.

## 2018-04-08 ENCOUNTER — Telehealth: Payer: Self-pay | Admitting: Oncology

## 2018-04-08 NOTE — Telephone Encounter (Addendum)
Patient advised of message below from [name].  She verbalized agreement and understanding.  She then said that she is feeling weak, not able to eat very much (has lost about 4 lbs), has some nausea/vomiting and is burping a lot.  She is drinking a lot of water, does not feel dehydrated and does not feel she needs IV fluids.  She takes Zofran prn and is concerned about constipation.  Her last bm was yesterday but she has to drink smooth moves tea before she can have a bm.  She is not taking miralax because it is harder on her stomach.  Advised her to eat small meals and snacks more often, continue drinking fluids and to take Zofran as needed for the nausea.

## 2018-04-08 NOTE — Telephone Encounter (Signed)
[name] called and asked when she will have CA 125 testing and CT scans during chemotherapy to check if it is working.  She also asked if her genetic testing was approved by her insurance.  Advised her that I will check and call her back.

## 2018-04-08 NOTE — Telephone Encounter (Signed)
Her CA-125 was recently checked and not elevated We typically do not repeat CT scan until she has completed 6 cycles of treatment Her last CT showed no evidence of disease

## 2018-04-15 ENCOUNTER — Inpatient Hospital Stay: Payer: Medicare HMO

## 2018-04-15 ENCOUNTER — Telehealth: Payer: Self-pay | Admitting: Hematology and Oncology

## 2018-04-15 ENCOUNTER — Inpatient Hospital Stay: Payer: Medicare HMO | Attending: Gynecologic Oncology

## 2018-04-15 ENCOUNTER — Encounter: Payer: Self-pay | Admitting: Hematology and Oncology

## 2018-04-15 ENCOUNTER — Encounter: Payer: Self-pay | Admitting: Oncology

## 2018-04-15 ENCOUNTER — Inpatient Hospital Stay (HOSPITAL_BASED_OUTPATIENT_CLINIC_OR_DEPARTMENT_OTHER): Payer: Medicare HMO | Admitting: Hematology and Oncology

## 2018-04-15 DIAGNOSIS — Z90722 Acquired absence of ovaries, bilateral: Secondary | ICD-10-CM

## 2018-04-15 DIAGNOSIS — E1165 Type 2 diabetes mellitus with hyperglycemia: Secondary | ICD-10-CM | POA: Insufficient documentation

## 2018-04-15 DIAGNOSIS — Z7984 Long term (current) use of oral hypoglycemic drugs: Secondary | ICD-10-CM | POA: Diagnosis not present

## 2018-04-15 DIAGNOSIS — Z79899 Other long term (current) drug therapy: Secondary | ICD-10-CM

## 2018-04-15 DIAGNOSIS — C562 Malignant neoplasm of left ovary: Secondary | ICD-10-CM

## 2018-04-15 DIAGNOSIS — K5900 Constipation, unspecified: Secondary | ICD-10-CM | POA: Insufficient documentation

## 2018-04-15 DIAGNOSIS — Z5111 Encounter for antineoplastic chemotherapy: Secondary | ICD-10-CM | POA: Diagnosis not present

## 2018-04-15 DIAGNOSIS — M898X9 Other specified disorders of bone, unspecified site: Secondary | ICD-10-CM

## 2018-04-15 DIAGNOSIS — T451X5A Adverse effect of antineoplastic and immunosuppressive drugs, initial encounter: Secondary | ICD-10-CM

## 2018-04-15 DIAGNOSIS — Z90710 Acquired absence of both cervix and uterus: Secondary | ICD-10-CM | POA: Insufficient documentation

## 2018-04-15 DIAGNOSIS — N281 Cyst of kidney, acquired: Secondary | ICD-10-CM

## 2018-04-15 DIAGNOSIS — Z7689 Persons encountering health services in other specified circumstances: Secondary | ICD-10-CM | POA: Diagnosis not present

## 2018-04-15 DIAGNOSIS — G629 Polyneuropathy, unspecified: Secondary | ICD-10-CM | POA: Diagnosis not present

## 2018-04-15 DIAGNOSIS — G620 Drug-induced polyneuropathy: Secondary | ICD-10-CM

## 2018-04-15 DIAGNOSIS — K5909 Other constipation: Secondary | ICD-10-CM

## 2018-04-15 LAB — CMP (CANCER CENTER ONLY)
ALBUMIN: 4.1 g/dL (ref 3.5–5.0)
ALT: 18 U/L (ref 0–44)
AST: 13 U/L — AB (ref 15–41)
Alkaline Phosphatase: 68 U/L (ref 38–126)
Anion gap: 12 (ref 5–15)
BILIRUBIN TOTAL: 0.4 mg/dL (ref 0.3–1.2)
BUN: 16 mg/dL (ref 8–23)
CALCIUM: 9.8 mg/dL (ref 8.9–10.3)
CO2: 25 mmol/L (ref 22–32)
CREATININE: 0.71 mg/dL (ref 0.44–1.00)
Chloride: 103 mmol/L (ref 98–111)
GFR, Est AFR Am: >60 mL/min (ref >60)
GFR, Estimated: >60 mL/min (ref >60)
GLUCOSE: 134 mg/dL — AB (ref 70–99)
Potassium: 3.8 mmol/L (ref 3.5–5.1)
SODIUM: 140 mmol/L (ref 135–145)
Total Protein: 6.6 g/dL (ref 6.5–8.1)

## 2018-04-15 LAB — CBC WITH DIFFERENTIAL (CANCER CENTER ONLY)
BASOS PCT: 1 %
Basophils Absolute: 0.1 10*3/uL (ref 0.0–0.1)
EOS ABS: 0 10*3/uL (ref 0.0–0.5)
EOS PCT: 0 %
HEMATOCRIT: 35.2 % (ref 34.8–46.6)
Hemoglobin: 11.8 g/dL (ref 11.6–15.9)
Lymphocytes Relative: 8 %
Lymphs Abs: 0.9 10*3/uL (ref 0.9–3.3)
MCH: 30.5 pg (ref 25.1–34.0)
MCHC: 33.6 g/dL (ref 31.5–36.0)
MCV: 90.6 fL (ref 79.5–101.0)
MONO ABS: 0.1 10*3/uL (ref 0.1–0.9)
MONOS PCT: 1 %
NEUTROS ABS: 10.1 10*3/uL — AB (ref 1.5–6.5)
Neutrophils Relative %: 90 %
PLATELETS: 206 10*3/uL (ref 145–400)
RBC: 3.88 MIL/uL (ref 3.70–5.45)
RDW: 18.8 % — AB (ref 11.2–14.5)
WBC Count: 11.1 10*3/uL — ABNORMAL HIGH (ref 3.9–10.3)

## 2018-04-15 MED ORDER — DIPHENHYDRAMINE HCL 50 MG/ML IJ SOLN
INTRAMUSCULAR | Status: AC
  Filled 2018-04-15: qty 1

## 2018-04-15 MED ORDER — SODIUM CHLORIDE 0.9 % IV SOLN
140.0000 mg/m2 | Freq: Once | INTRAVENOUS | Status: AC
  Administered 2018-04-15: 270 mg via INTRAVENOUS
  Filled 2018-04-15: qty 45

## 2018-04-15 MED ORDER — FAMOTIDINE IN NACL 20-0.9 MG/50ML-% IV SOLN
INTRAVENOUS | Status: AC
  Filled 2018-04-15: qty 50

## 2018-04-15 MED ORDER — SODIUM CHLORIDE 0.9% FLUSH
10.0000 mL | Freq: Once | INTRAVENOUS | Status: AC
  Administered 2018-04-15: 10 mL
  Filled 2018-04-15: qty 10

## 2018-04-15 MED ORDER — FOSAPREPITANT DIMEGLUMINE INJECTION 150 MG
Freq: Once | INTRAVENOUS | Status: AC
  Administered 2018-04-15: 10:00:00 via INTRAVENOUS
  Filled 2018-04-15: qty 5

## 2018-04-15 MED ORDER — PALONOSETRON HCL INJECTION 0.25 MG/5ML
INTRAVENOUS | Status: AC
  Filled 2018-04-15: qty 5

## 2018-04-15 MED ORDER — PALONOSETRON HCL INJECTION 0.25 MG/5ML
0.2500 mg | Freq: Once | INTRAVENOUS | Status: AC
  Administered 2018-04-15: 0.25 mg via INTRAVENOUS

## 2018-04-15 MED ORDER — HEPARIN SOD (PORK) LOCK FLUSH 100 UNIT/ML IV SOLN
500.0000 [IU] | Freq: Once | INTRAVENOUS | Status: AC | PRN
Start: 2018-04-15 — End: 2018-04-15
  Administered 2018-04-15: 500 [IU]
  Filled 2018-04-15: qty 5

## 2018-04-15 MED ORDER — FAMOTIDINE IN NACL 20-0.9 MG/50ML-% IV SOLN
20.0000 mg | Freq: Once | INTRAVENOUS | Status: AC
  Administered 2018-04-15: 20 mg via INTRAVENOUS

## 2018-04-15 MED ORDER — SODIUM CHLORIDE 0.9% FLUSH
10.0000 mL | INTRAVENOUS | Status: DC | PRN
  Administered 2018-04-15: 10 mL
  Filled 2018-04-15: qty 10

## 2018-04-15 MED ORDER — DIPHENHYDRAMINE HCL 50 MG/ML IJ SOLN
50.0000 mg | Freq: Once | INTRAMUSCULAR | Status: AC
  Administered 2018-04-15: 50 mg via INTRAVENOUS

## 2018-04-15 MED ORDER — SODIUM CHLORIDE 0.9 % IV SOLN
510.0000 mg | Freq: Once | INTRAVENOUS | Status: AC
  Administered 2018-04-15: 510 mg via INTRAVENOUS
  Filled 2018-04-15: qty 51

## 2018-04-15 MED ORDER — SODIUM CHLORIDE 0.9 % IV SOLN
Freq: Once | INTRAVENOUS | Status: AC
  Administered 2018-04-15: 10:00:00 via INTRAVENOUS

## 2018-04-15 NOTE — Assessment & Plan Note (Signed)
She had severe side effects of G-CSF injection causing her the need to take pain medicine and that in effect caused severe constipation She has no infectious complication from treatment I recommend omission of G-CSF for this cycle and she agreed with the plan of care

## 2018-04-15 NOTE — Telephone Encounter (Signed)
No new orders per 7/1 los.

## 2018-04-15 NOTE — Patient Instructions (Signed)
   [facility] Discharge Instructions for Patients Receiving Chemotherapy  Today you received the following chemotherapy agents Taxol and Carboplatin   To help prevent nausea and vomiting after your treatment, we encourage you to take your nausea medication as directed.    If you develop nausea and vomiting that is not controlled by your nausea medication, call the clinic.   BELOW ARE SYMPTOMS THAT SHOULD BE REPORTED IMMEDIATELY:  *FEVER GREATER THAN 100.5 F  *CHILLS WITH OR WITHOUT FEVER  NAUSEA AND VOMITING THAT IS NOT CONTROLLED WITH YOUR NAUSEA MEDICATION  *UNUSUAL SHORTNESS OF BREATH  *UNUSUAL BRUISING OR BLEEDING  TENDERNESS IN MOUTH AND THROAT WITH OR WITHOUT PRESENCE OF ULCERS  *URINARY PROBLEMS  *BOWEL PROBLEMS  UNUSUAL RASH Items with * indicate a potential emergency and should be followed up as soon as possible.  Feel free to call the clinic should you have any questions or concerns. The clinic phone number is [phone].  Please show the CHEMO ALERT CARD at check-in to the Emergency Department and triage nurse.   

## 2018-04-15 NOTE — Progress Notes (Signed)
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 She had severe side effects of G-CSF injection causing her the need to take pain medicine and that in effect caused severe constipation She has no infectious complication from treatment I recommend omission of G-CSF for this cycle and she agreed with the plan of care  Other constipation We had extensive discussion about chronic constipation management and the use of laxative therapy She is advised to call us if she develops severe constipation again  Neuropathy due to chemotherapeutic drug ([place]) She complained of mild peripheral neuropathy likely induced by poorly controlled blood sugar and chemotherapy The pain affecting the tips of her right fourth and fifth digit are unrelated, these are likely due to compression of ulnar nerve of which the patient has received injection to the elbow in the past she has mild peripheral neuropathy, likely related to side effects of treatment. I plan to reduce the dose of treatment as outlined above.  I explained to the patient the rationale of this strategy and reassured the patient it would not compromise the efficacy of treatment I recommend she continues taking gabapentin and pain medicine as needed   No orders of the defined types were placed in this encounter.   INTERVAL HISTORY: Please see below for problem oriented charting. She is seen  prior to cycle 3 of chemotherapy With her last cycle of treatment, she had severe bone pain secondary to G-CSF injection For many days, she had poor oral intake, poor mobility and the need to take pain medicine to counteract the side effects of severe bone pain She then developed severe constipation which subsequently resolved with significant laxative therapy She denies worsening peripheral neuropathy. 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 - TUMOR INVOLVES OVARY SURFACE AND LEFT FALLOPIAN TUBE. - SEE ONCOLOGY TABLE. 2. Mesentery, small bowel mesentery biopsy #1 - BENIGN FIBROADIPOSE TISSUE. 3. Ovary and fallopian tube, right - BENIGN OVARY WITH INCLUSION CYSTS. - BENIGN FALLOPIAN TUBE WITH PARATUBAL CYSTS AND ADENOFIBROMA. 4. Omentum, resection for tumor - BENIGN ADIPOSE TISSUE. 5. Peritoneum, biopsy, left diaphragmatic - BENIGN PERITONEAL TYPE TISSUE. 6. Peritoneum, biopsy, right diaphragmatic - BENIGN PERITONEAL TYPE TISSUE. 7. Mesentery, small bowel mesenteric biopsy #2 - BENIGN FIBROADIPOSE TISSUE. 8. Lymph nodes, regional resection, right pelvic - FIVE OF FIVE LYMPH NODES NEGATIVE FOR CARCINOMA (0/5). 9. Lymph nodes, regional resection, left pelvic - FOUR OF FOUR LYMPH NODES NEGATIVE FOR CARCINOMA (0/4). 10. Peritoneum, biopsy, left gutter - BENIGN PERITONEAL TYPE TISSUE. 11. Peritoneum, biopsy, bladder - BENIGN PERITONEAL TYPE TISSUE WITH ACUTE INFLAMMATION AND CALCIFICATIONS.  12. Peritoneum, biopsy, cul-de-sac - BENIGN PERITONEAL TYPE TISSUE WITH ACUTE INFLAMMATION AND  CALCIFICATIONS. 13. Soft tissue, biopsy, right gutter - BENIGN FIBROADIPOSE TISSUE. 14. Lymph node, biopsy, right para-aortic -  TWO OF TWO LYMPH NODES NEGATIVE FOR CARCINOMA (0/2). 15. Lymph node, biopsy, left para-aortic - ONE OF ONE LYMPH NODES NEGATIVE FOR CARCINOMA (0/1). Microscopic Comment 1. OVARY Specimen(s): Left ovary and fallopian tube. Procedure: (including lymph node sampling): Bilateral salpingo-oophorectomy with omental and peritoneal biopsies and lymph node biopsies. Primary tumor site (including laterality): Left ovary. Ovarian surface involvement: Present. Ovarian capsule intact without fragmentation: Intact. Maximum tumor size (cm): 11 cm total. Histologic type: Serous cystadenocarcinoma. Grade: High grade (low grade areas also present). Peritoneal implants: (specify invasive or non-invasive): N/A. Pelvic extension (list additional structures on separate lines and if involved): Left fallopian tube. Lymph nodes: number examined 12 ; number positive 0 TNM code: pT2a, pN0, pMX FIGO Stage (based on pathologic findings, needs clinical correlation): IIA Comments: None.      01/31/2018 Pathology Results    PERITONEAL WASHING (SPECIMEN 1 OF 1 COLLECTED 01/31/18): MALIGNANT CELLS PRESENT CONSISTENT WITH METASTATIC CARCINOMA.      01/31/2018 Surgery    Procedure(s) Performed:  1. Robotic BSO and washings. 2. Exploratory laparotomy, Staging including infragastric omentectomy, Bilateral pelvic and paraaortic lymphadenectomy, pertioneal biopsies.  Specimens: Bilateral tubes / ovaries, bilateral pelvic and paraaortic lymph nodes, peritoneal biopsies, washings and omentum.  Operative Findings: The left adnexa was adherent to the left pelvic peritoneum suspected due to the inferior retraction from the vaginal hysterectomy. Intraoperative leakage of cystic fluid from left ovary.  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  The right adnexa grossly appeared normal although slightly enlarged for her age.  There were some indurated lymph nodes but no overtly  malignant lymph nodes were suspected.  Small bowel mesentery had 3-4 superficial possible implants.  1 of these was sent for frozen section returned benign.  She did have adhesive disease from her open cholecystectomy in the right upper quadrant.  No other evidence of disease in the abdomen or pelvis on palpation; specifically including the small bowel stomach and large bowel       01/31/2018 Genetic Testing    Patient has genetic testing done for BRCA mutation on tumor sample Results revealed patient has no mutation      02/05/2018 Imaging    CT scan of abdomen 1. 16 mm exophytic lesion upper pole right kidney has attenuation too high to be a simple cyst. This may be a cyst complicated by proteinaceous debris or hemorrhage, but renal cell carcinoma is a concern. Routine outpatient follow-up MRI of the abdomen without and with contrast recommended after resolution of patient's acute symptoms (so she is better able to participate with positioning and breath holding). 2. Mild edema/possible minimal hemorrhage in the extraperitoneal soft tissues of the pelvic for tracking up both pelvic sidewalls. Imaging appearance is not outside of the spectrum of findings expected 6 days after the reported surgery. Trace interloop mesenteric and free fluid is identified in the peritoneal cavity in there is some mild edema in the omentum. There is no organized or rim enhancing collection to suggest evolving abscess. 3. Gas in the extraperitoneal soft tissues of the left lower quadrant is associated with gas in the subcutaneous fat of the lower left anterior abdominal wall. This is not unexpected on postoperative day 6. No evidence for intraperitoneal free air. 4. No CT features to suggest small bowel obstruction. Oral contrast material has migrated about halfway through the small bowel loops but there  is no differential distention of proximal versus distal small bowel. The colon is diffusely distended and fluid-filled  proximally, but formed stool is noted in the distal colon. Given apparent slow migration of contrast and fluid-filled small and large bowel loops, a component of ileus is possible.      02/14/2018 Imaging    MR abdomen Benign Bosniak category 2 cyst in upper pole of right kidney, corresponding with lesion seen on previous CT. No evidence of renal neoplasm or other significant abnormality.       02/18/2018 Cancer Staging    Staging form: Ovary, Fallopian Tube, and Primary Peritoneal Carcinoma, AJCC 8th Edition - Pathologic: Stage II (pT2, pN0, cM0) - Signed by Heath Lark, MD on 02/18/2018      02/28/2018 Procedure    Status post right IJ port catheter placement. Catheter ready for use       REVIEW OF SYSTEMS:   Constitutional: Denies fevers, chills or abnormal weight loss Eyes: Denies blurriness of vision Ears, nose, mouth, throat, and face: Denies mucositis or sore throat Respiratory: Denies cough, dyspnea or wheezes Cardiovascular: Denies palpitation, chest discomfort or lower extremity swelling Skin: Denies abnormal skin rashes Lymphatics: Denies new lymphadenopathy or easy bruising Neurological:Denies numbness, tingling or new weaknesses Behavioral/Psych: Mood is stable, no new changes  All other systems were reviewed with the patient and are negative.  I have reviewed the past medical history, past surgical history, social history and family history with the patient and they are unchanged from previous note.  ALLERGIES:  is allergic to penicillins.  MEDICATIONS:  Current Outpatient Medications  Medication Sig Dispense Refill  . ALPRAZolam (XANAX) 0.5 MG tablet Take 0.5 mg by mouth at bedtime.   5  . amLODipine (NORVASC) 5 MG tablet Take 2.5 mg by mouth every morning.    Marland Kitchen atorvastatin (LIPITOR) 20 MG tablet Take 20 mg by mouth every evening.     . Cholecalciferol (VITAMIN D) 2000 units tablet Take 2,000 Units by mouth daily.    . Cyanocobalamin (B-12 COMPLIANCE INJECTION)  1000 MCG/ML KIT Inject 1,000 mcg as directed every 30 (thirty) days.    Marland Kitchen dexamethasone (DECADRON) 4 MG tablet Take 3 tabs the night before and 3 tabs the morning of chemotherapy, every 3 weeks, with food 60 tablet 0  . docusate sodium (COLACE) 100 MG capsule Take 200 mg by mouth daily as needed (severe constipation).    Marland Kitchen exenatide (BYETTA) 10 MCG/0.04ML SOPN injection Inject 10 mcg into the skin 2 (two) times daily.     . feeding supplement, GLUCERNA SHAKE, (GLUCERNA SHAKE) LIQD Take 237 mLs by mouth daily.    Marland Kitchen FLUoxetine (PROZAC) 20 MG capsule Take 20 mg by mouth every morning.    Marland Kitchen FLUOXETINE HCL PO Take 20 mg by mouth daily.    Marland Kitchen gabapentin (NEURONTIN) 300 MG capsule Take 300 mg by mouth 2 (two) times daily.    . hydrochlorothiazide (HYDRODIURIL) 25 MG tablet Take 25 mg by mouth every morning.    Marland Kitchen levothyroxine (SYNTHROID, LEVOTHROID) 100 MCG tablet Take 100 mcg by mouth daily before breakfast.    . lidocaine-prilocaine (EMLA) cream Apply to affected area once 30 g 3  . losartan (COZAAR) 100 MG tablet Take 100 mg by mouth every morning.    . Melatonin 5 MG TABS Take 5-10 mg by mouth at bedtime.    . metFORMIN (GLUCOPHAGE-XR) 500 MG 24 hr tablet Take 1,000 mg by mouth 2 (two) times daily.    . naproxen (NAPROSYN) 250  MG tablet Take 1 tablet (250 mg total) by mouth 2 (two) times daily with a meal. 60 tablet 2  . omeprazole (PRILOSEC OTC) 20 MG tablet Take 20 mg by mouth daily.    . ondansetron (ZOFRAN) 4 MG tablet Take 1 tablet (4 mg total) by mouth every 8 (eight) hours as needed for nausea or vomiting. 20 tablet 0  . ondansetron (ZOFRAN) 8 MG tablet Take 1 tablet (8 mg total) by mouth 2 (two) times daily as needed for refractory nausea / vomiting. Start on day 3 after chemo. 30 tablet 1  . oxyCODONE (OXY IR/ROXICODONE) 5 MG immediate release tablet Take 1 tablet (5 mg total) by mouth every 4 (four) hours as needed for moderate pain. 60 tablet 0  . potassium chloride (K-DUR,KLOR-CON) 10 MEQ  tablet Take 10 mEq by mouth every evening.     . prochlorperazine (COMPAZINE) 10 MG tablet Take 1 tablet (10 mg total) by mouth every 6 (six) hours as needed (Nausea or vomiting). 30 tablet 1  . SENNA PO Take 3 tablets by mouth daily as needed (severe constipation).    . vitamin E 400 UNIT capsule Take 400 Units by mouth daily.     No current facility-administered medications for this visit.    Facility-Administered Medications Ordered in Other Visits  Medication Dose Route Frequency Provider Last Rate Last Dose  . CARBOplatin (PARAPLATIN) 510 mg in sodium chloride 0.9 % 250 mL chemo infusion  510 mg Intravenous Once Alvy Bimler, Tiffeny Minchew, MD      . heparin lock flush 100 unit/mL  500 Units Intracatheter Once PRN Alvy Bimler, Eyla Tallon, MD      . PACLitaxel (TAXOL) 270 mg in sodium chloride 0.9 % 250 mL chemo infusion (> 25m/m2)  140 mg/m2 (Treatment Plan Recorded) Intravenous Once GHeath Lark MD 98 mL/hr at 04/15/18 1134 270 mg at 04/15/18 1134  . sodium chloride flush (NS) 0.9 % injection 10 mL  10 mL Intracatheter PRN GAlvy Bimler Manon Banbury, MD        PHYSICAL EXAMINATION: ECOG PERFORMANCE STATUS: 1 - Symptomatic but completely ambulatory  Vitals:   04/15/18 0923  BP: (!) 134/59  Pulse: 87  Resp: 18  Temp: 98.7 F (37.1 C)  SpO2: 95%   Filed Weights   04/15/18 0923  Weight: 176 lb 11.2 oz (80.2 kg)    GENERAL:alert, no distress and comfortable SKIN: skin color, texture, turgor are normal, no rashes or significant lesions EYES: normal, Conjunctiva are pink and non-injected, sclera clear OROPHARYNX:no exudate, no erythema and lips, buccal mucosa, and tongue normal  NECK: supple, thyroid normal size, non-tender, without nodularity LYMPH:  no palpable lymphadenopathy in the cervical, axillary or inguinal LUNGS: clear to auscultation and percussion with normal breathing effort HEART: regular rate & rhythm and no murmurs and no lower extremity edema ABDOMEN:abdomen soft, non-tender and normal bowel  sounds Musculoskeletal:no cyanosis of digits and no clubbing  NEURO: alert & oriented x 3 with fluent speech, no focal motor/sensory deficits  LABORATORY DATA:  I have reviewed the data as listed    Component Value Date/Time   NA 140 04/15/2018 0833   K 3.8 04/15/2018 0833   CL 103 04/15/2018 0833   CO2 25 04/15/2018 0833   GLUCOSE 134 (H) 04/15/2018 0833   BUN 16 04/15/2018 0833   CREATININE 0.71 04/15/2018 0833   CALCIUM 9.8 04/15/2018 0833   PROT 6.6 04/15/2018 0833   ALBUMIN 4.1 04/15/2018 0833   AST 13 (L) 04/15/2018 0833   ALT 18 04/15/2018 05621  ALKPHOS 68 04/15/2018 0833   BILITOT 0.4 04/15/2018 0833   GFRNONAA >60 04/15/2018 0833   GFRAA >60 04/15/2018 0833    No results found for: SPEP, UPEP  Lab Results  Component Value Date   WBC 11.1 (H) 04/15/2018   NEUTROABS 10.1 (H) 04/15/2018   HGB 11.8 04/15/2018   HCT 35.2 04/15/2018   MCV 90.6 04/15/2018   PLT 206 04/15/2018      Chemistry      Component Value Date/Time   NA 140 04/15/2018 0833   K 3.8 04/15/2018 0833   CL 103 04/15/2018 0833   CO2 25 04/15/2018 0833   BUN 16 04/15/2018 0833   CREATININE 0.71 04/15/2018 0833      Component Value Date/Time   CALCIUM 9.8 04/15/2018 0833   ALKPHOS 68 04/15/2018 0833   AST 13 (L) 04/15/2018 0833   ALT 18 04/15/2018 0833   BILITOT 0.4 04/15/2018 0833       All questions were answered. The patient knows to call the clinic with any problems, questions or concerns. No barriers to learning was detected.  I spent 25 minutes counseling the patient face to face. The total time spent in the appointment was 30 minutes and more than 50% was on counseling and review of test results  Heath Lark, MD 04/15/2018 12:32 PM

## 2018-04-15 NOTE — Assessment & Plan Note (Signed)
We had extensive discussion about chronic constipation management and the use of laxative therapy She is advised to call us if she develops severe constipation again

## 2018-04-15 NOTE — Assessment & Plan Note (Signed)
She complained of mild peripheral neuropathy likely induced by poorly controlled blood sugar and chemotherapy The pain affecting the tips of her right fourth and fifth digit are unrelated, these are likely due to compression of ulnar nerve of which the patient has received injection to the elbow in the past she has mild peripheral neuropathy, likely related to side effects of treatment. I plan to reduce the dose of treatment as outlined above.  I explained to the patient the rationale of this strategy and reassured the patient it would not compromise the efficacy of treatment I recommend she continues taking gabapentin and pain medicine as needed

## 2018-04-15 NOTE — Assessment & Plan Note (Signed)
Overall, she tolerated treatment well with expected side effects of therapy We will proceed with treatment with minor dose adjustment of taxol I do not recommend checking tumor marker or CT scan during treatment We have a long discussion about the side effects of treatment and ways to minimize side effects including discontinuation of G-CSF support for the cycle and she agreed with the plan of care

## 2018-04-17 ENCOUNTER — Inpatient Hospital Stay: Payer: Medicare HMO

## 2018-04-17 ENCOUNTER — Telehealth: Payer: Self-pay | Admitting: Oncology

## 2018-04-17 NOTE — Telephone Encounter (Signed)
Patient notified of message below.

## 2018-04-17 NOTE — Telephone Encounter (Signed)
Glennys called and said she has not had a bowel movement since Friday.  She said she does not have the urge to have a bowel movement and is just burping.  She is taking Senakot 2 tablets TID (took 4 times yesterday), para colace BID, Miralax BID and "smooth moves" tea.  She is trying to eat a lot of fiber and is drinking water.  She wants to know if there is anything else she can do.

## 2018-04-17 NOTE — Telephone Encounter (Signed)
I recommend Dulcolax  suppository

## 2018-04-23 ENCOUNTER — Telehealth: Payer: Self-pay | Admitting: Oncology

## 2018-04-23 NOTE — Telephone Encounter (Signed)
[name] called and said she has crown that is sensitive and she is wondering if it is OK to go to the dentist and possibly take an antibiotic.  She said she had this problem last year and was given an antibiotic and the sensitivity went away.  She wants to make sure it is OK with chemotherapy.

## 2018-04-23 NOTE — Telephone Encounter (Signed)
Yes ok to see dentist and take antibiotics if needed

## 2018-04-23 NOTE — Telephone Encounter (Signed)
Left a message for patient with below message from [name].

## 2018-04-26 ENCOUNTER — Telehealth: Payer: Self-pay | Admitting: Oncology

## 2018-04-26 DIAGNOSIS — Z9889 Other specified postprocedural states: Principal | ICD-10-CM

## 2018-04-26 DIAGNOSIS — R112 Nausea with vomiting, unspecified: Secondary | ICD-10-CM

## 2018-04-26 MED ORDER — OXYCODONE HCL 5 MG PO TABS: 5.0000 mg | ORAL_TABLET | ORAL | 0 refills | Status: DC | PRN

## 2018-04-26 NOTE — Telephone Encounter (Signed)
Notified [name] that prescription is available for pick up at the [facility].

## 2018-04-26 NOTE — Telephone Encounter (Signed)
[name] called and said she is still having pain in her tooth and is wondering if it could be from chemo.  She went to her dentist yesterday and he did not see any signs of infection but did put her on Clindamycin for 10 days.  She is also taking ibuprofen and took her last two oxycodone tablets last night.  She is now out of oxycodone (last filled on 03/07/18) and is asking for a refill.

## 2018-04-29 ENCOUNTER — Telehealth: Payer: Self-pay | Admitting: Oncology

## 2018-04-29 NOTE — Telephone Encounter (Signed)
[name] called back and said she had the x ray and it did not show anything.  Her dentist is recommending coming back to see [name] to check her WBC count or referring to an oral surgeon.

## 2018-04-29 NOTE — Telephone Encounter (Signed)
[name] called and said her tooth (lower molar) is still very painful.  She saw her dentist again on Friday who recommended continuing her antibiotic - Clindamycin.  She has one day left now.  She is going to see her dentist again today for another x-ray.  She said the pain is severe and the OxyContin is not touching it.  She read on the Internet that when your white blood cell count is low it may cause dental problems.  She is wondering if she needs to come in to have her blood counts checked.  She is also going to call back after her dentist appointment to let us know the results of the x-ray.

## 2018-04-29 NOTE — Telephone Encounter (Signed)
I do not know if there is an oral surgeon that the dentist recommend? No point recheck her WBC now. It's probably back to normal range by now

## 2018-04-29 NOTE — Telephone Encounter (Signed)
Called [name] back and advised her of message below.  She said her dentist wanted the OK from [name] before she sees an Chief Financial Officer.  She said she has gone to [facility] in the past.  She would like to wait a day or two before she is referred.  She will call back tomorrow.

## 2018-05-06 ENCOUNTER — Inpatient Hospital Stay: Payer: Medicare HMO

## 2018-05-06 ENCOUNTER — Telehealth: Payer: Self-pay | Admitting: Hematology and Oncology

## 2018-05-06 ENCOUNTER — Encounter: Payer: Self-pay | Admitting: Hematology and Oncology

## 2018-05-06 ENCOUNTER — Inpatient Hospital Stay: Payer: Medicare HMO | Admitting: Hematology and Oncology

## 2018-05-06 VITALS — BP 137/67 | HR 76 | Temp 97.9°F | Resp 18 | Ht 64.5 in | Wt 173.6 lb

## 2018-05-06 DIAGNOSIS — E1165 Type 2 diabetes mellitus with hyperglycemia: Secondary | ICD-10-CM

## 2018-05-06 DIAGNOSIS — Z90722 Acquired absence of ovaries, bilateral: Secondary | ICD-10-CM

## 2018-05-06 DIAGNOSIS — K5900 Constipation, unspecified: Secondary | ICD-10-CM | POA: Diagnosis not present

## 2018-05-06 DIAGNOSIS — K5909 Other constipation: Secondary | ICD-10-CM

## 2018-05-06 DIAGNOSIS — I10 Essential (primary) hypertension: Secondary | ICD-10-CM

## 2018-05-06 DIAGNOSIS — Z79899 Other long term (current) drug therapy: Secondary | ICD-10-CM

## 2018-05-06 DIAGNOSIS — C562 Malignant neoplasm of left ovary: Secondary | ICD-10-CM

## 2018-05-06 DIAGNOSIS — G629 Polyneuropathy, unspecified: Secondary | ICD-10-CM | POA: Diagnosis not present

## 2018-05-06 DIAGNOSIS — G620 Drug-induced polyneuropathy: Secondary | ICD-10-CM

## 2018-05-06 DIAGNOSIS — M898X9 Other specified disorders of bone, unspecified site: Secondary | ICD-10-CM

## 2018-05-06 DIAGNOSIS — Z7984 Long term (current) use of oral hypoglycemic drugs: Secondary | ICD-10-CM

## 2018-05-06 DIAGNOSIS — Z5111 Encounter for antineoplastic chemotherapy: Secondary | ICD-10-CM | POA: Diagnosis not present

## 2018-05-06 DIAGNOSIS — T451X5A Adverse effect of antineoplastic and immunosuppressive drugs, initial encounter: Secondary | ICD-10-CM

## 2018-05-06 DIAGNOSIS — Z90710 Acquired absence of both cervix and uterus: Secondary | ICD-10-CM

## 2018-05-06 DIAGNOSIS — N281 Cyst of kidney, acquired: Secondary | ICD-10-CM

## 2018-05-06 LAB — CBC WITH DIFFERENTIAL (CANCER CENTER ONLY)
BASOS ABS: 0 10*3/uL (ref 0.0–0.1)
BASOS PCT: 0 %
EOS ABS: 0 10*3/uL (ref 0.0–0.5)
EOS PCT: 0 %
HEMATOCRIT: 36 % (ref 34.8–46.6)
Hemoglobin: 12.1 g/dL (ref 11.6–15.9)
Lymphocytes Relative: 10 %
Lymphs Abs: 0.7 10*3/uL — ABNORMAL LOW (ref 0.9–3.3)
MCH: 31.1 pg (ref 25.1–34.0)
MCHC: 33.5 g/dL (ref 31.5–36.0)
MCV: 92.8 fL (ref 79.5–101.0)
Monocytes Absolute: 0.2 10*3/uL (ref 0.1–0.9)
Monocytes Relative: 2 %
NEUTROS PCT: 88 %
Neutro Abs: 6.8 10*3/uL — ABNORMAL HIGH (ref 1.5–6.5)
Platelet Count: 179 10*3/uL (ref 145–400)
RBC: 3.88 MIL/uL (ref 3.70–5.45)
RDW: 19.3 % — AB (ref 11.2–14.5)
WBC: 7.7 10*3/uL (ref 3.9–10.3)

## 2018-05-06 LAB — CMP (CANCER CENTER ONLY)
ALT: 14 U/L (ref 0–44)
AST: 12 U/L — ABNORMAL LOW (ref 15–41)
Albumin: 4.2 g/dL (ref 3.5–5.0)
Alkaline Phosphatase: 65 U/L (ref 38–126)
Anion gap: 12 (ref 5–15)
BILIRUBIN TOTAL: 0.5 mg/dL (ref 0.3–1.2)
BUN: 14 mg/dL (ref 8–23)
CO2: 25 mmol/L (ref 22–32)
CREATININE: 0.69 mg/dL (ref 0.44–1.00)
Calcium: 10.3 mg/dL (ref 8.9–10.3)
Chloride: 103 mmol/L (ref 98–111)
Glucose, Bld: 124 mg/dL — ABNORMAL HIGH (ref 70–99)
POTASSIUM: 3.9 mmol/L (ref 3.5–5.1)
Sodium: 140 mmol/L (ref 135–145)
TOTAL PROTEIN: 7.2 g/dL (ref 6.5–8.1)

## 2018-05-06 MED ORDER — SODIUM CHLORIDE 0.9 % IV SOLN
Freq: Once | INTRAVENOUS | Status: AC
  Administered 2018-05-06: 11:00:00 via INTRAVENOUS

## 2018-05-06 MED ORDER — HEPARIN SOD (PORK) LOCK FLUSH 100 UNIT/ML IV SOLN
500.0000 [IU] | Freq: Once | INTRAVENOUS | Status: AC | PRN
  Administered 2018-05-06: 500 [IU]
  Filled 2018-05-06: qty 5

## 2018-05-06 MED ORDER — SODIUM CHLORIDE 0.9 % IV SOLN
510.0000 mg | Freq: Once | INTRAVENOUS | Status: AC
  Administered 2018-05-06: 510 mg via INTRAVENOUS
  Filled 2018-05-06: qty 51

## 2018-05-06 MED ORDER — DIPHENHYDRAMINE HCL 50 MG/ML IJ SOLN
50.0000 mg | Freq: Once | INTRAMUSCULAR | Status: AC
  Administered 2018-05-06: 50 mg via INTRAVENOUS

## 2018-05-06 MED ORDER — PALONOSETRON HCL INJECTION 0.25 MG/5ML
0.2500 mg | Freq: Once | INTRAVENOUS | Status: AC
  Administered 2018-05-06: 0.25 mg via INTRAVENOUS

## 2018-05-06 MED ORDER — SODIUM CHLORIDE 0.9 % IV SOLN
140.0000 mg/m2 | Freq: Once | INTRAVENOUS | Status: AC
  Administered 2018-05-06: 270 mg via INTRAVENOUS
  Filled 2018-05-06: qty 45

## 2018-05-06 MED ORDER — FAMOTIDINE IN NACL 20-0.9 MG/50ML-% IV SOLN
INTRAVENOUS | Status: AC
Start: 2018-05-06 — End: ?
  Filled 2018-05-06: qty 50

## 2018-05-06 MED ORDER — HYDROCODONE-ACETAMINOPHEN 5-325 MG PO TABS: 1.0000 | ORAL_TABLET | Freq: Four times a day (QID) | ORAL | 0 refills | Status: DC | PRN

## 2018-05-06 MED ORDER — DIPHENHYDRAMINE HCL 50 MG/ML IJ SOLN
INTRAMUSCULAR | Status: AC
  Filled 2018-05-06: qty 1

## 2018-05-06 MED ORDER — SODIUM CHLORIDE 0.9% FLUSH
10.0000 mL | INTRAVENOUS | Status: DC | PRN
  Administered 2018-05-06: 10 mL
  Filled 2018-05-06: qty 10

## 2018-05-06 MED ORDER — FAMOTIDINE IN NACL 20-0.9 MG/50ML-% IV SOLN
20.0000 mg | Freq: Once | INTRAVENOUS | Status: AC
  Administered 2018-05-06: 20 mg via INTRAVENOUS

## 2018-05-06 MED ORDER — SODIUM CHLORIDE 0.9 % IV SOLN
Freq: Once | INTRAVENOUS | Status: AC
  Administered 2018-05-06: 11:00:00 via INTRAVENOUS
  Filled 2018-05-06: qty 5

## 2018-05-06 MED ORDER — PALONOSETRON HCL INJECTION 0.25 MG/5ML
INTRAVENOUS | Status: AC
  Filled 2018-05-06: qty 5

## 2018-05-06 MED ORDER — SODIUM CHLORIDE 0.9% FLUSH
10.0000 mL | Freq: Once | INTRAVENOUS | Status: AC
  Administered 2018-05-06: 10 mL
  Filled 2018-05-06: qty 10

## 2018-05-06 NOTE — Assessment & Plan Note (Signed)
Her neuropathy is stable since recent dose adjustment We will continue on similar dose adjustment and she will continue taking gabapentin as directed

## 2018-05-06 NOTE — Progress Notes (Signed)
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 She has intermittent bone pain secondary to Taxol and G-CSF We discussed pain management for this and she agreed with the plan of care to resume G-CSF support Per patient request, I have switched her from oxycodone to hydrocodone to take as needed for severe pain  Neuropathy due to chemotherapeutic drug ([place]) Her neuropathy is stable since recent dose adjustment We will continue on similar dose adjustment and she will continue taking gabapentin as directed  Essential hypertension She takes chronic blood pressure medications for management but have recent symptomatic dizziness I recommend discontinuation of hydrochlorothiazide and potassium supplement  Other constipation We had extensive discussion about chronic constipation management and the use of laxative therapy She is advised to call us if she develops severe constipation again   No orders of the defined types were placed in this encounter.   INTERVAL HISTORY: Please see below for problem oriented charting. She returns for further follow-up and for cycle 4 of chemotherapy With her last treatment, despite omission of G-CSF support, she denies significant changes in her perception of bone pain Neuropathy is stable She described neuropathy as a occasional burning sensation and cold-like sensation but not severe She had recent tooth  sensitivity and jaw pain She was evaluated by dentist and was prescribed antibiotic therapy with resolution of her symptoms She denies recent cough, shortness of breath or fever She continues to battle with severe constipation, resolved with aggressive laxative therapy She complained of occasional postural dizziness  SUMMARY OF ONCOLOGIC HISTORY: Oncology History   Neg BRCA test on tumor     Malignant neoplasm of left ovary ([place])   01/31/2018 Pathology Results    1. 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 - TUMOR INVOLVES OVARY SURFACE AND LEFT FALLOPIAN TUBE. - SEE ONCOLOGY TABLE. 2. Mesentery, small bowel mesentery biopsy #1 - BENIGN FIBROADIPOSE TISSUE. 3. Ovary and fallopian tube, right - BENIGN OVARY WITH INCLUSION CYSTS. - BENIGN FALLOPIAN TUBE WITH PARATUBAL CYSTS AND ADENOFIBROMA. 4. Omentum, resection for tumor - BENIGN ADIPOSE TISSUE. 5. Peritoneum, biopsy, left diaphragmatic - BENIGN PERITONEAL TYPE TISSUE. 6. Peritoneum, biopsy, right diaphragmatic - BENIGN PERITONEAL TYPE TISSUE. 7. Mesentery, small bowel mesenteric biopsy #2 - BENIGN FIBROADIPOSE TISSUE. 8. Lymph nodes, regional resection, right pelvic - FIVE OF FIVE LYMPH NODES NEGATIVE FOR CARCINOMA (0/5). 9. Lymph nodes, regional resection, left pelvic - FOUR OF FOUR LYMPH NODES NEGATIVE FOR CARCINOMA (0/4). 10. Peritoneum, biopsy, left gutter - BENIGN PERITONEAL TYPE TISSUE. 11. Peritoneum, biopsy, bladder - BENIGN PERITONEAL TYPE TISSUE WITH ACUTE INFLAMMATION AND CALCIFICATIONS.  12. Peritoneum, biopsy, cul-de-sac - BENIGN PERITONEAL TYPE TISSUE WITH ACUTE INFLAMMATION AND CALCIFICATIONS. 13. Soft tissue, biopsy, right gutter - BENIGN FIBROADIPOSE TISSUE. 14. Lymph node, biopsy, right para-aortic - TWO OF TWO LYMPH NODES NEGATIVE FOR CARCINOMA (0/2). 15. Lymph node, biopsy, left para-aortic - ONE OF ONE LYMPH NODES NEGATIVE FOR CARCINOMA  (0/1).  Microscopic Comment 1. OVARY Specimen(s): Left ovary and fallopian tube. Procedure: (including lymph node sampling): Bilateral salpingo-oophorectomy with omental and peritoneal biopsies and lymph node biopsies. Primary tumor site (including laterality): Left ovary. Ovarian surface involvement: Present. Ovarian capsule intact without fragmentation: Intact. Maximum tumor size (cm): 11 cm total. Histologic type: Serous cystadenocarcinoma. Grade: High grade (low grade areas also present). Peritoneal implants: (specify invasive or non-invasive): N/A. Pelvic extension (list additional structures on separate lines and if involved): Left fallopian tube. Lymph nodes: number examined 12 ; number positive 0 TNM code: pT2a, pN0, pMX FIGO Stage (based on pathologic findings, needs clinical correlation): IIA Comments: None.      01/31/2018 Pathology Results    PERITONEAL WASHING (SPECIMEN 1 OF 1 COLLECTED 01/31/18): MALIGNANT CELLS PRESENT CONSISTENT WITH METASTATIC CARCINOMA.      01/31/2018 Surgery    Procedure(s) Performed:  1. Robotic BSO and washings. 2. Exploratory laparotomy, Staging including infragastric omentectomy, Bilateral pelvic and paraaortic lymphadenectomy, pertioneal biopsies.  Specimens: Bilateral tubes / ovaries, bilateral pelvic and paraaortic lymph nodes, peritoneal biopsies, washings and omentum.  Operative Findings: The left adnexa was adherent to the left pelvic peritoneum suspected due to the inferior retraction from the vaginal hysterectomy. Intraoperative leakage of cystic fluid from left ovary.  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  The right adnexa grossly appeared normal although slightly enlarged for her age.  There were some indurated lymph nodes but no overtly malignant lymph nodes were suspected.  Small bowel mesentery had 3-4 superficial possible implants.  1 of these was sent for frozen section returned  benign.  She did have adhesive disease from her open cholecystectomy in the right upper quadrant.  No other evidence of disease in the abdomen or pelvis on palpation; specifically including the small bowel stomach and large bowel       01/31/2018 Genetic Testing    Patient has genetic testing done for BRCA mutation on tumor sample Results revealed patient has no mutation      02/05/2018 Imaging    CT scan of abdomen 1. 16 mm exophytic lesion upper pole right kidney has attenuation too high to be a simple cyst. This may be a cyst complicated by proteinaceous debris or hemorrhage, but renal cell carcinoma is a concern. Routine outpatient follow-up MRI of the abdomen without and with contrast recommended after resolution of patient's acute symptoms (so she is better able to participate with positioning and breath holding). 2. Mild edema/possible minimal hemorrhage in the extraperitoneal soft tissues of the pelvic for tracking up both pelvic sidewalls. Imaging appearance is not outside of the spectrum of findings expected 6 days after the reported surgery. Trace interloop mesenteric and free fluid is identified in the peritoneal cavity in there is some mild edema in the omentum. There is no organized or rim enhancing collection to suggest evolving abscess. 3. Gas in the extraperitoneal soft tissues of the left lower quadrant is associated with gas in the subcutaneous fat of the lower left anterior abdominal wall. This is not unexpected on postoperative day 6. No evidence for intraperitoneal free air. 4. No CT features to suggest small bowel obstruction. Oral contrast material has migrated about halfway through the small bowel loops but there is no differential distention of proximal versus distal small bowel. The colon is diffusely distended and fluid-filled proximally, but formed stool is noted in the distal colon. Given apparent slow migration of contrast and fluid-filled small and large bowel loops, a  component of  ileus is possible.      02/14/2018 Imaging    MR abdomen Benign Bosniak category 2 cyst in upper pole of right kidney, corresponding with lesion seen on previous CT. No evidence of renal neoplasm or other significant abnormality.       02/18/2018 Cancer Staging    Staging form: Ovary, Fallopian Tube, and Primary Peritoneal Carcinoma, AJCC 8th Edition - Pathologic: Stage II (pT2, pN0, cM0) - Signed by Heath Lark, MD on 02/18/2018      02/28/2018 Procedure    Status post right IJ port catheter placement. Catheter ready for use       REVIEW OF SYSTEMS:   Constitutional: Denies fevers, chills or abnormal weight loss Eyes: Denies blurriness of vision Ears, nose, mouth, throat, and face: Denies mucositis or sore throat Respiratory: Denies cough, dyspnea or wheezes Cardiovascular: Denies palpitation, chest discomfort or lower extremity swelling Skin: Denies abnormal skin rashes Lymphatics: Denies new lymphadenopathy or easy bruising Neurological:Denies numbness, tingling or new weaknesses Behavioral/Psych: Mood is stable, no new changes  All other systems were reviewed with the patient and are negative.  I have reviewed the past medical history, past surgical history, social history and family history with the patient and they are unchanged from previous note.  ALLERGIES:  is allergic to penicillins.  MEDICATIONS:  Current Outpatient Medications  Medication Sig Dispense Refill  . ALPRAZolam (XANAX) 0.5 MG tablet Take 0.5 mg by mouth at bedtime.   5  . amLODipine (NORVASC) 5 MG tablet Take 2.5 mg by mouth every morning.    Marland Kitchen atorvastatin (LIPITOR) 20 MG tablet Take 20 mg by mouth every evening.     . Cholecalciferol (VITAMIN D) 2000 units tablet Take 2,000 Units by mouth daily.    . Cyanocobalamin (B-12 COMPLIANCE INJECTION) 1000 MCG/ML KIT Inject 1,000 mcg as directed every 30 (thirty) days.    Marland Kitchen dexamethasone (DECADRON) 4 MG tablet Take 3 tabs the night before and 3 tabs  the morning of chemotherapy, every 3 weeks, with food 60 tablet 0  . docusate sodium (COLACE) 100 MG capsule Take 200 mg by mouth daily as needed (severe constipation).    Marland Kitchen exenatide (BYETTA) 10 MCG/0.04ML SOPN injection Inject 10 mcg into the skin 2 (two) times daily.     . feeding supplement, GLUCERNA SHAKE, (GLUCERNA SHAKE) LIQD Take 237 mLs by mouth daily.    Marland Kitchen FLUoxetine (PROZAC) 20 MG capsule Take 20 mg by mouth every morning.    Marland Kitchen FLUOXETINE HCL PO Take 20 mg by mouth daily.    Marland Kitchen gabapentin (NEURONTIN) 300 MG capsule Take 300 mg by mouth 2 (two) times daily.    . hydrochlorothiazide (HYDRODIURIL) 25 MG tablet Take 25 mg by mouth every morning.    Marland Kitchen HYDROcodone-acetaminophen (NORCO/VICODIN) 5-325 MG tablet Take 1 tablet by mouth every 6 (six) hours as needed for moderate pain. 30 tablet 0  . levothyroxine (SYNTHROID, LEVOTHROID) 100 MCG tablet Take 100 mcg by mouth daily before breakfast.    . lidocaine-prilocaine (EMLA) cream Apply to affected area once 30 g 3  . losartan (COZAAR) 100 MG tablet Take 100 mg by mouth every morning.    . Melatonin 5 MG TABS Take 5-10 mg by mouth at bedtime.    . metFORMIN (GLUCOPHAGE-XR) 500 MG 24 hr tablet Take 1,000 mg by mouth 2 (two) times daily.    . naproxen (NAPROSYN) 250 MG tablet Take 1 tablet (250 mg total) by mouth 2 (two) times daily with a meal. 60 tablet 2  .  omeprazole (PRILOSEC OTC) 20 MG tablet Take 20 mg by mouth daily.    . ondansetron (ZOFRAN) 4 MG tablet Take 1 tablet (4 mg total) by mouth every 8 (eight) hours as needed for nausea or vomiting. 20 tablet 0  . ondansetron (ZOFRAN) 8 MG tablet Take 1 tablet (8 mg total) by mouth 2 (two) times daily as needed for refractory nausea / vomiting. Start on day 3 after chemo. 30 tablet 1  . potassium chloride (K-DUR,KLOR-CON) 10 MEQ tablet Take 10 mEq by mouth every evening.     . prochlorperazine (COMPAZINE) 10 MG tablet Take 1 tablet (10 mg total) by mouth every 6 (six) hours as needed (Nausea  or vomiting). 30 tablet 1  . SENNA PO Take 3 tablets by mouth daily as needed (severe constipation).    . vitamin E 400 UNIT capsule Take 400 Units by mouth daily.     No current facility-administered medications for this visit.    Facility-Administered Medications Ordered in Other Visits  Medication Dose Route Frequency Provider Last Rate Last Dose  . CARBOplatin (PARAPLATIN) 510 mg in sodium chloride 0.9 % 250 mL chemo infusion  510 mg Intravenous Once Alvy Bimler, Ellery Tash, MD      . heparin lock flush 100 unit/mL  500 Units Intracatheter Once PRN Alvy Bimler, Brighten Buzzelli, MD      . PACLitaxel (TAXOL) 270 mg in sodium chloride 0.9 % 250 mL chemo infusion (> 26m/m2)  140 mg/m2 (Treatment Plan Recorded) Intravenous Once GAlvy Bimler Sonia Stickels, MD 98 mL/hr at 05/06/18 1200 270 mg at 05/06/18 1200  . sodium chloride flush (NS) 0.9 % injection 10 mL  10 mL Intracatheter PRN GAlvy Bimler Cassidi Modesitt, MD        PHYSICAL EXAMINATION: ECOG PERFORMANCE STATUS: 1 - Symptomatic but completely ambulatory  Vitals:   05/06/18 0936  BP: 137/67  Pulse: 76  Resp: 18  Temp: 97.9 F (36.6 C)  SpO2: 100%   Filed Weights   05/06/18 0936  Weight: 173 lb 9.6 oz (78.7 kg)    GENERAL:alert, no distress and comfortable SKIN: skin color, texture, turgor are normal, no rashes or significant lesions EYES: normal, Conjunctiva are pink and non-injected, sclera clear OROPHARYNX:no exudate, no erythema and lips, buccal mucosa, and tongue normal  NECK: supple, thyroid normal size, non-tender, without nodularity LYMPH:  no palpable lymphadenopathy in the cervical, axillary or inguinal LUNGS: clear to auscultation and percussion with normal breathing effort HEART: regular rate & rhythm and no murmurs and no lower extremity edema ABDOMEN:abdomen soft, non-tender and normal bowel sounds Musculoskeletal:no cyanosis of digits and no clubbing  NEURO: alert & oriented x 3 with fluent speech, no focal motor/sensory deficits  LABORATORY DATA:  I have reviewed  the data as listed    Component Value Date/Time   NA 140 05/06/2018 0838   K 3.9 05/06/2018 0838   CL 103 05/06/2018 0838   CO2 25 05/06/2018 0838   GLUCOSE 124 (H) 05/06/2018 0838   BUN 14 05/06/2018 0838   CREATININE 0.69 05/06/2018 0838   CALCIUM 10.3 05/06/2018 0838   PROT 7.2 05/06/2018 0838   ALBUMIN 4.2 05/06/2018 0838   AST 12 (L) 05/06/2018 0838   ALT 14 05/06/2018 0838   ALKPHOS 65 05/06/2018 0838   BILITOT 0.5 05/06/2018 0838   GFRNONAA >60 05/06/2018 0838   GFRAA >60 05/06/2018 0838    No results found for: SPEP, UPEP  Lab Results  Component Value Date   WBC 7.7 05/06/2018   NEUTROABS 6.8 (H) 05/06/2018  HGB 12.1 05/06/2018   HCT 36.0 05/06/2018   MCV 92.8 05/06/2018   PLT 179 05/06/2018      Chemistry      Component Value Date/Time   NA 140 05/06/2018 0838   K 3.9 05/06/2018 0838   CL 103 05/06/2018 0838   CO2 25 05/06/2018 0838   BUN 14 05/06/2018 0838   CREATININE 0.69 05/06/2018 0838      Component Value Date/Time   CALCIUM 10.3 05/06/2018 0838   ALKPHOS 65 05/06/2018 0838   AST 12 (L) 05/06/2018 0838   ALT 14 05/06/2018 0838   BILITOT 0.5 05/06/2018 0838      All questions were answered. The patient knows to call the clinic with any problems, questions or concerns. No barriers to learning was detected.  I spent 25 minutes counseling the patient face to face. The total time spent in the appointment was 30 minutes and more than 50% was on counseling and review of test results  Heath Lark, MD 05/06/2018 1:21 PM

## 2018-05-06 NOTE — Patient Instructions (Signed)
   [facility] Discharge Instructions for Patients Receiving Chemotherapy  Today you received the following chemotherapy agents Taxol and Carboplatin   To help prevent nausea and vomiting after your treatment, we encourage you to take your nausea medication as directed.    If you develop nausea and vomiting that is not controlled by your nausea medication, call the clinic.   BELOW ARE SYMPTOMS THAT SHOULD BE REPORTED IMMEDIATELY:  *FEVER GREATER THAN 100.5 F  *CHILLS WITH OR WITHOUT FEVER  NAUSEA AND VOMITING THAT IS NOT CONTROLLED WITH YOUR NAUSEA MEDICATION  *UNUSUAL SHORTNESS OF BREATH  *UNUSUAL BRUISING OR BLEEDING  TENDERNESS IN MOUTH AND THROAT WITH OR WITHOUT PRESENCE OF ULCERS  *URINARY PROBLEMS  *BOWEL PROBLEMS  UNUSUAL RASH Items with * indicate a potential emergency and should be followed up as soon as possible.  Feel free to call the clinic should you have any questions or concerns. The clinic phone number is [phone].  Please show the CHEMO ALERT CARD at check-in to the Emergency Department and triage nurse.   

## 2018-05-06 NOTE — Assessment & Plan Note (Signed)
She takes chronic blood pressure medications for management but have recent symptomatic dizziness I recommend discontinuation of hydrochlorothiazide and potassium supplement

## 2018-05-06 NOTE — Assessment & Plan Note (Signed)
She has intermittent bone pain secondary to Taxol and G-CSF We discussed pain management for this and she agreed with the plan of care to resume G-CSF support Per patient request, I have switched her from oxycodone to hydrocodone to take as needed for severe pain

## 2018-05-06 NOTE — Telephone Encounter (Signed)
Gave avs and calendar ° °

## 2018-05-06 NOTE — Assessment & Plan Note (Signed)
The patient had multiple different side effects with each cycle of treatment With recent dose adjustment, she is able to tolerate with supportive care With recent tooth sensitivity and possible dental infection, I recommend resumption of G-CSF support She has no signs of active infection now I will continue to see her before each cycle for further supportive care and review of toxicity

## 2018-05-06 NOTE — Assessment & Plan Note (Signed)
We had extensive discussion about chronic constipation management and the use of laxative therapy She is advised to call us if she develops severe constipation again

## 2018-05-08 ENCOUNTER — Inpatient Hospital Stay: Payer: Medicare HMO

## 2018-05-08 VITALS — BP 127/66 | HR 72 | Temp 97.8°F | Resp 20

## 2018-05-08 DIAGNOSIS — C562 Malignant neoplasm of left ovary: Secondary | ICD-10-CM

## 2018-05-08 DIAGNOSIS — Z5111 Encounter for antineoplastic chemotherapy: Secondary | ICD-10-CM | POA: Diagnosis not present

## 2018-05-08 MED ORDER — PEGFILGRASTIM-CBQV 6 MG/0.6ML ~~LOC~~ SOSY
6.0000 mg | PREFILLED_SYRINGE | Freq: Once | SUBCUTANEOUS | Status: AC
  Administered 2018-05-08: 6 mg via SUBCUTANEOUS

## 2018-05-08 MED ORDER — PEGFILGRASTIM-CBQV 6 MG/0.6ML ~~LOC~~ SOSY
PREFILLED_SYRINGE | SUBCUTANEOUS | Status: AC
  Filled 2018-05-08: qty 0.6

## 2018-05-08 NOTE — Patient Instructions (Signed)
Pegfilgrastim injection What is this medicine? PEGFILGRASTIM (PEG fil gra stim) is a long-acting granulocyte colony-stimulating factor that stimulates the growth of neutrophils, a type of white blood cell important in the body's fight against infection. It is used to reduce the incidence of fever and infection in patients with certain types of cancer who are receiving chemotherapy that affects the bone marrow, and to increase survival after being exposed to high doses of radiation. This medicine may be used for other purposes; ask your health care provider or pharmacist if you have questions. COMMON BRAND NAME(S): Neulasta What should I tell my health care provider before I take this medicine? They need to know if you have any of these conditions: -kidney disease -latex allergy -ongoing radiation therapy -sickle cell disease -skin reactions to acrylic adhesives (On-Body Injector only) -an unusual or allergic reaction to pegfilgrastim, filgrastim, other medicines, foods, dyes, or preservatives -pregnant or trying to get pregnant -breast-feeding How should I use this medicine? This medicine is for injection under the skin. If you get this medicine at home, you will be taught how to prepare and give the pre-filled syringe or how to use the On-body Injector. Refer to the patient Instructions for Use for detailed instructions. Use exactly as directed. Tell your healthcare provider immediately if you suspect that the On-body Injector may not have performed as intended or if you suspect the use of the On-body Injector resulted in a missed or partial dose. It is important that you put your used needles and syringes in a special sharps container. Do not put them in a trash can. If you do not have a sharps container, call your pharmacist or healthcare provider to get one. Talk to your pediatrician regarding the use of this medicine in children. While this drug may be prescribed for selected conditions,  precautions do apply. Overdosage: If you think you have taken too much of this medicine contact a poison control center or emergency room at once. NOTE: This medicine is only for you. Do not share this medicine with others. What if I miss a dose? It is important not to miss your dose. Call your doctor or health care professional if you miss your dose. If you miss a dose due to an On-body Injector failure or leakage, a new dose should be administered as soon as possible using a single prefilled syringe for manual use. What may interact with this medicine? Interactions have not been studied. Give your health care provider a list of all the medicines, herbs, non-prescription drugs, or dietary supplements you use. Also tell them if you smoke, drink alcohol, or use illegal drugs. Some items may interact with your medicine. This list may not describe all possible interactions. Give your health care provider a list of all the medicines, herbs, non-prescription drugs, or dietary supplements you use. Also tell them if you smoke, drink alcohol, or use illegal drugs. Some items may interact with your medicine. What should I watch for while using this medicine? You may need blood work done while you are taking this medicine. If you are going to need a MRI, CT scan, or other procedure, tell your doctor that you are using this medicine (On-Body Injector only). What side effects may I notice from receiving this medicine? Side effects that you should report to your doctor or health care professional as soon as possible: -allergic reactions like skin rash, itching or hives, swelling of the face, lips, or tongue -dizziness -fever -pain, redness, or irritation at site   where injected -pinpoint red spots on the skin -red or dark-brown urine -shortness of breath or breathing problems -stomach or side pain, or pain at the shoulder -swelling -tiredness -trouble passing urine or change in the amount of urine Side  effects that usually do not require medical attention (report to your doctor or health care professional if they continue or are bothersome): -bone pain -muscle pain This list may not describe all possible side effects. Call your doctor for medical advice about side effects. You may report side effects to FDA at 1-800-FDA-1088. Where should I keep my medicine? Keep out of the reach of children. Store pre-filled syringes in a refrigerator between 2 and 8 degrees C (36 and 46 degrees F). Do not freeze. Keep in carton to protect from light. Throw away this medicine if it is left out of the refrigerator for more than 48 hours. Throw away any unused medicine after the expiration date. NOTE: This sheet is a summary. It may not cover all possible information. If you have questions about this medicine, talk to your doctor, pharmacist, or health care provider.  2018 Elsevier/Gold Standard (2016-09-28 12:58:03)  

## 2018-05-16 ENCOUNTER — Inpatient Hospital Stay: Payer: Medicare HMO | Attending: Gynecologic Oncology | Admitting: Medical

## 2018-05-16 ENCOUNTER — Telehealth: Payer: Self-pay | Admitting: Oncology

## 2018-05-16 ENCOUNTER — Ambulatory Visit (HOSPITAL_COMMUNITY)
Admission: RE | Admit: 2018-05-16 | Discharge: 2018-05-16 | Disposition: A | Discharge status: Home / Self Care | Payer: Medicare HMO | Source: Ambulatory Visit | Attending: Medical | Admitting: Medical

## 2018-05-16 ENCOUNTER — Inpatient Hospital Stay: Payer: Medicare HMO

## 2018-05-16 VITALS — BP 127/70 | HR 76 | Temp 98.2°F | Resp 18 | Ht 64.5 in | Wt 173.8 lb

## 2018-05-16 DIAGNOSIS — K567 Ileus, unspecified: Secondary | ICD-10-CM

## 2018-05-16 DIAGNOSIS — Z803 Family history of malignant neoplasm of breast: Secondary | ICD-10-CM

## 2018-05-16 DIAGNOSIS — E039 Hypothyroidism, unspecified: Secondary | ICD-10-CM

## 2018-05-16 DIAGNOSIS — Z7689 Persons encountering health services in other specified circumstances: Secondary | ICD-10-CM | POA: Diagnosis not present

## 2018-05-16 DIAGNOSIS — C562 Malignant neoplasm of left ovary: Secondary | ICD-10-CM

## 2018-05-16 DIAGNOSIS — E538 Deficiency of other specified B group vitamins: Secondary | ICD-10-CM

## 2018-05-16 DIAGNOSIS — T451X5A Adverse effect of antineoplastic and immunosuppressive drugs, initial encounter: Secondary | ICD-10-CM | POA: Diagnosis not present

## 2018-05-16 DIAGNOSIS — Z5111 Encounter for antineoplastic chemotherapy: Secondary | ICD-10-CM | POA: Diagnosis present

## 2018-05-16 DIAGNOSIS — E119 Type 2 diabetes mellitus without complications: Secondary | ICD-10-CM

## 2018-05-16 DIAGNOSIS — M898X9 Other specified disorders of bone, unspecified site: Secondary | ICD-10-CM | POA: Insufficient documentation

## 2018-05-16 DIAGNOSIS — Z8052 Family history of malignant neoplasm of bladder: Secondary | ICD-10-CM

## 2018-05-16 DIAGNOSIS — K219 Gastro-esophageal reflux disease without esophagitis: Secondary | ICD-10-CM

## 2018-05-16 DIAGNOSIS — R140 Abdominal distension (gaseous): Secondary | ICD-10-CM

## 2018-05-16 DIAGNOSIS — M129 Arthropathy, unspecified: Secondary | ICD-10-CM

## 2018-05-16 DIAGNOSIS — Z800 Family history of malignant neoplasm of digestive organs: Secondary | ICD-10-CM | POA: Diagnosis not present

## 2018-05-16 DIAGNOSIS — Z801 Family history of malignant neoplasm of trachea, bronchus and lung: Secondary | ICD-10-CM | POA: Diagnosis not present

## 2018-05-16 DIAGNOSIS — I10 Essential (primary) hypertension: Secondary | ICD-10-CM | POA: Diagnosis not present

## 2018-05-16 DIAGNOSIS — K5900 Constipation, unspecified: Secondary | ICD-10-CM | POA: Insufficient documentation

## 2018-05-16 DIAGNOSIS — R109 Unspecified abdominal pain: Secondary | ICD-10-CM | POA: Diagnosis not present

## 2018-05-16 DIAGNOSIS — G620 Drug-induced polyneuropathy: Secondary | ICD-10-CM | POA: Insufficient documentation

## 2018-05-16 LAB — CMP (CANCER CENTER ONLY)
ALBUMIN: 3.8 g/dL (ref 3.5–5.0)
ALT: 21 U/L (ref 0–44)
AST: 18 U/L (ref 15–41)
Alkaline Phosphatase: 85 U/L (ref 38–126)
Anion gap: 8 (ref 5–15)
BUN: 13 mg/dL (ref 8–23)
CO2: 29 mmol/L (ref 22–32)
CREATININE: 0.69 mg/dL (ref 0.44–1.00)
Calcium: 9.8 mg/dL (ref 8.9–10.3)
Chloride: 104 mmol/L (ref 98–111)
GFR, Est AFR Am: >60 mL/min (ref >60)
GLUCOSE: 101 mg/dL — AB (ref 70–99)
Potassium: 4.1 mmol/L (ref 3.5–5.1)
SODIUM: 141 mmol/L (ref 135–145)
Total Bilirubin: 0.3 mg/dL (ref 0.3–1.2)
Total Protein: 6.5 g/dL (ref 6.5–8.1)

## 2018-05-16 LAB — CBC WITH DIFFERENTIAL (CANCER CENTER ONLY)
BASOS ABS: 0 10*3/uL (ref 0.0–0.1)
BASOS PCT: 0 %
EOS ABS: 0.1 10*3/uL (ref 0.0–0.5)
EOS PCT: 1 %
HCT: 33.5 % — ABNORMAL LOW (ref 34.8–46.6)
HEMOGLOBIN: 10.9 g/dL — AB (ref 11.6–15.9)
LYMPHS ABS: 1.9 10*3/uL (ref 0.9–3.3)
Lymphocytes Relative: 20 %
MCH: 31.1 pg (ref 25.1–34.0)
MCHC: 32.5 g/dL (ref 31.5–36.0)
MCV: 95.4 fL (ref 79.5–101.0)
Monocytes Absolute: 0.9 10*3/uL (ref 0.1–0.9)
Monocytes Relative: 9 %
NEUTROS PCT: 70 %
Neutro Abs: 6.6 10*3/uL — ABNORMAL HIGH (ref 1.5–6.5)
PLATELETS: 125 10*3/uL — AB (ref 145–400)
RBC: 3.51 MIL/uL — ABNORMAL LOW (ref 3.70–5.45)
RDW: 17.8 % — ABNORMAL HIGH (ref 11.2–14.5)
WBC: 9.4 10*3/uL (ref 3.9–10.3)

## 2018-05-16 MED ORDER — LINACLOTIDE 72 MCG PO CAPS: 72.0000 ug | ORAL_CAPSULE | Freq: Every day | ORAL | 2 refills | Status: DC

## 2018-05-16 NOTE — Telephone Encounter (Signed)
Called [name] back and she would like to see [name] in Symptom Management today.  Patient would like appointment for 1 pm labs and 1:30 with Symptom Management.  Message sent to scheduling.

## 2018-05-16 NOTE — Addendum Note (Signed)
Addended by: Elmo Putt R on: 05/16/2018 10:46 AM   Modules accepted: Orders

## 2018-05-16 NOTE — Telephone Encounter (Signed)
I am not able to work her in today If she wants to be seen today, she needs to see [name] in symptom management

## 2018-05-16 NOTE — Telephone Encounter (Signed)
[name] called and is concerned about her stomach.  She said she is having stomach pain at night (said she can hear her stomach rolling) and does not pass gas.  She said she has to take laxatives to have a bowel movement - her last bowel movement was yesterday.  She has stopped taking Senakot thinking it may be causing her stomach pain.  She is taking milk of magnesia BID, peri colace BID and gas-x at bedtime.  She is concerned because she had an ileus in April and is wondering if she still has one or if she has an ulcer.  She asked if she should see someone about this or wait until chemotherapy is finished.

## 2018-05-17 NOTE — Progress Notes (Signed)
[facility] Progress Note   [name] [id number] 1941/08/24 77 y.o.  [name] is managed by Dr. Heath Park  Actively treated with chemotherapy/immunotherapy: yes  Current Therapy: Carboplatin and paclitaxel with PEG filgrastim support  Last Treated: 05/06/2018 (cycle 4, day 1) with PEG filgrastim dosed on 05/08/2018.  Assessment: Plan:    Constipation, unspecified constipation type - Plan: DG Abd 2 Views, linaclotide (LINZESS) 32 MCG capsule  Ileus ([place]) - Plan: DG Abd 2 Views  Malignant neoplasm of left ovary (HCC)   Constipation with a history of an ileus: The patient was referred for a KUB which showed no evidence of an ileus.  The patient has used multiple agents including MiraLAX p.o. twice daily, 8 Senokot a day, Colace p.o. twice daily, and 1 bottle of milk of magnesia every other day.  The patient would like to try Linzess as this is what her daughter has used with good results.  A prescription for Linzess 72 mcg once daily was sent to her pharmacy.  Malignant neoplasm of the left ovary: The patient is status post cycle 4, day 1 of carboplatin and paclitaxel with PEG filgrastim support.  She is scheduled to follow-up with Dr. Heath Park on 05/24/2018.  Please see After Visit Summary for patient specific instructions.  Future Appointments  Date Time Provider [place]  05/24/2018 10:45 AM [facility] LAB 3 [facility] None  05/24/2018 11:15 AM Amy Lark, MD [facility] None  05/27/2018  8:30 AM [facility] INFUSION [facility] None  05/29/2018  2:00 PM [facility] [place] FLUSH [facility] None  06/18/2018  9:30 AM [facility] LAB 2 [facility] None  06/18/2018  9:45 AM [facility] [place] FLUSH [facility] None  06/18/2018 10:15 AM [name], Ni, MD [facility] None  06/18/2018 11:15 AM [facility] INFUSION [facility] None  06/20/2018  3:15 PM [facility] [place] FLUSH [facility] None    Orders Placed This Encounter  Procedures  . DG Abd 2 Views       Subjective:    Patient ID:  [name] is a 77 y.o. (DOB 26-Mar-1941) female.  Chief Complaint: No chief complaint on file.   HPI [name] is a 77 year old female with a history of a malignant neoplasm of the left ovary who is managed by Dr. Heath Park and is status post cycle 4, day 1 of carboplatin and paclitaxel with PEG filgrastim support.  She has a long-standing history of constipation and has a history of an ileus.  She is concerned that her ileus could have recurred.  She has used MiraLAX p.o. twice daily, 8 Senokot daily, Colace p.o. twice daily, and milk of magnesia 1 bottle every other day for her constipation.  Despite these efforts she has had minimal stool.  She is having abdominal pain and cramping.  She reports that she is having bloating and increased gas.  She burps but denies flatulence.  She has been taking probiotics and Gas-X.  She can hear her stomach rumbling.  She reports having hand pain with her chemotherapy.  She denies nausea, vomiting, fevers, chills, melena, or bright red blood per rectum.  She presents to the office today with her daughter.  Her daughter reports that she has been taking Linzess for her history of constipation with good results.  [name] would like to try this.  Medications: I have reviewed the patient's current medications.  Allergies:  Allergies  Allergen Reactions  . Penicillins Anaphylaxis    Has patient had a PCN reaction causing immediate rash, facial/tongue/throat  swelling, SOB or lightheadedness with hypotension: Yes Has patient had a PCN reaction causing severe rash involving mucus membranes or skin necrosis: No Has patient had a PCN reaction that required hospitalization: Yes Has patient had a PCN reaction occurring within the last 10 years: No If all of the above answers are "NO", then may proceed with Cephalosporin use.     Past Medical History:  Diagnosis Date  . Arthritis   . Diabetes mellitus without complication ([place])    type 2    . Family history of bladder cancer   . Family history of breast cancer   . Family history of esophageal cancer   . Family history of lung cancer   . Family history of prostate cancer   . GERD (gastroesophageal reflux disease)   . Hypertension   . Hypothyroidism   . Ovarian cancer ([place]) 01/31/2018  . Pneumonia    its been 4 years   . PONV (postoperative nausea and vomiting)   . Vitamin B 12 deficiency     Past Surgical History:  Procedure Laterality Date  . BLADDER SUSPENSION  2002  . BREAST SURGERY     several benign cysts removed; surgeries from [place]   . CHOLECYSTECTOMY  1988  . EYE SURGERY  2010 amd 2012   cataracts removed bilateral   . IR FLUORO GUIDE PORT INSERTION RIGHT  02/28/2018  . IR US GUIDE VASC ACCESS RIGHT  02/28/2018  . KNEE ARTHROSCOPY Left    l knee x2  . PARATHYROIDECTOMY  2010  . ROBOTIC ASSISTED SALPINGO OOPHERECTOMY Bilateral 01/31/2018   Procedure: XI ROBOTIC ASSISTED BILATERAL SALPINGO OOPHORECTOMY, STAGING, LAPAROTOMY, PELVIC AND PARA AORTIC LYMPH NODE DISSECTION, OMENTECTOMY;  Surgeon: Isabel Caprice, MD;  Location: [facility];  Service: Gynecology;  Laterality: Bilateral;  . ROTATOR CUFF REPAIR  2005   right   . TOTAL KNEE ARTHROPLASTY Left 12/08/2014   Procedure: LEFT TOTAL KNEE ARTHROPLASTY;  Surgeon: Tobi Bastos, MD;  Location: [facility];  Service: Orthopedics;  Laterality: Left;  Marland Kitchen VAGINAL HYSTERECTOMY  1984    Family History  Problem Relation Age of Onset  . Lymphoma Mother 68  . Bladder Cancer Sister 71  . Prostate cancer Brother 30       has surgery right away after dx  . Leukemia Brother        48's  . Breast cancer Other 49       niece, GT 2017 reportedly neg  . Prostate cancer Father 82       found on autopsy at death (45)  . Lung cancer Maternal Aunt   . Esophageal cancer Maternal Uncle   . Other Maternal Grandfather 75       cerebral hemmhorage  . Lung cancer Cousin   . Lung cancer Cousin   . Leukemia Cousin      Social History   Socioeconomic History  . Marital status: Widowed    Spouse name: Not on file  . Number of children: 2  . Years of education: Not on file  . Highest education level: Not on file  Occupational History  . Occupation: retired Glass blower/designer  Social Needs  . Financial resource strain: Not on file  . Food insecurity:    Worry: Not on file    Inability: Not on file  . Transportation needs:    Medical: Not on file    Non-medical: Not on file  Tobacco Use  . Smoking status: Never Smoker  . Smokeless tobacco: Never  Used  Substance and Sexual Activity  . Alcohol use: No  . Drug use: No  . Sexual activity: Not on file  Lifestyle  . Physical activity:    Days per week: Not on file    Minutes per session: Not on file  . Stress: Not on file  Relationships  . Social connections:    Talks on phone: Not on file    Gets together: Not on file    Attends religious service: Not on file    Active member of club or organization: Not on file    Attends meetings of clubs or organizations: Not on file    Relationship status: Not on file  . Intimate partner violence:    Fear of current or ex partner: Not on file    Emotionally abused: Not on file    Physically abused: Not on file    Forced sexual activity: Not on file  Other Topics Concern  . Not on file  Social History Narrative  . Not on file    Past Medical History, Surgical history, Social history, and Family history were reviewed and updated as appropriate.   Please see review of systems for further details on the patient's review from today.   Review of Systems:  Review of Systems  Constitutional: Negative for appetite change, chills, diaphoresis, fever and unexpected weight change.  HENT: Negative for trouble swallowing.   Respiratory: Negative for cough, chest tightness and shortness of breath.   Cardiovascular: Negative for chest pain and palpitations.  Gastrointestinal: Positive for abdominal distention  and constipation. Negative for abdominal pain, anal bleeding, blood in stool, diarrhea, nausea, rectal pain and vomiting.    Objective:   Physical Exam:  BP 127/70 (BP Location: Left Arm, Patient Position: Sitting)   Pulse 76   Temp 98.2 F (36.8 C) (Oral)   Resp 18   Ht 5' 4.5" (1.638 m)   Wt 173 lb 12.8 oz (78.8 kg)   SpO2 99%   BMI 29.37 kg/m  ECOG: 0  Physical Exam  Constitutional: No distress.  HENT:  Head: Normocephalic and atraumatic.  Mouth/Throat: Oropharynx is clear and moist.  Cardiovascular: Normal rate, regular rhythm and normal heart sounds. Exam reveals no gallop and no friction rub.  No murmur heard. Pulmonary/Chest: Effort normal and breath sounds normal. No respiratory distress. She has no wheezes. She has no rales.  Abdominal: Soft. Bowel sounds are normal. She exhibits no distension and no mass. There is no tenderness. There is no rebound and no guarding.  Musculoskeletal: She exhibits no edema.  Neurological: She is alert.  Skin: Skin is warm and dry. She is not diaphoretic.  Psychiatric: She has a normal mood and affect. Her behavior is normal. Judgment and thought content normal.    Lab Review:     Component Value Date/Time   NA 141 05/16/2018 1307   K 4.1 05/16/2018 1307   CL 104 05/16/2018 1307   CO2 29 05/16/2018 1307   GLUCOSE 101 (H) 05/16/2018 1307   BUN 13 05/16/2018 1307   CREATININE 0.69 05/16/2018 1307   CALCIUM 9.8 05/16/2018 1307   PROT 6.5 05/16/2018 1307   ALBUMIN 3.8 05/16/2018 1307   AST 18 05/16/2018 1307   ALT 21 05/16/2018 1307   ALKPHOS 85 05/16/2018 1307   BILITOT 0.3 05/16/2018 1307   GFRNONAA >60 05/16/2018 1307   GFRAA >60 05/16/2018 1307       Component Value Date/Time   WBC 9.4 05/16/2018 1307   WBC  7.4 02/28/2018 1019   RBC 3.51 (L) 05/16/2018 1307   HGB 10.9 (L) 05/16/2018 1307   HCT 33.5 (L) 05/16/2018 1307   PLT 125 (L) 05/16/2018 1307   MCV 95.4 05/16/2018 1307   MCH 31.1 05/16/2018 1307   MCHC 32.5  05/16/2018 1307   RDW 17.8 (H) 05/16/2018 1307   LYMPHSABS 1.9 05/16/2018 1307   MONOABS 0.9 05/16/2018 1307   EOSABS 0.1 05/16/2018 1307   BASOSABS 0.0 05/16/2018 1307   -------------------------------  Imaging from last 24 hours (if applicable):  Radiology interpretation: Dg Abd 2 Views  Result Date: 05/16/2018 CLINICAL DATA:  Constipation since abdominal surgery 4 months ago. EXAM: ABDOMEN - 2 VIEW COMPARISON:  Abdominal radiographs 03/01/2018. FINDINGS: The lung bases are clear. The heart size is normal. The bowel gas pattern is normal. There is no evidence of free air. No radio-opaque calculi or other significant radiographic abnormality is seen. Degenerative changes are again noted within the lumbar spine. IMPRESSION: Negative two view abdomen radiographs. Electronically Signed   By: San Morelle M.D.   On: 05/16/2018 14:49

## 2018-05-24 ENCOUNTER — Inpatient Hospital Stay (HOSPITAL_BASED_OUTPATIENT_CLINIC_OR_DEPARTMENT_OTHER): Payer: Medicare HMO | Admitting: Hematology and Oncology

## 2018-05-24 ENCOUNTER — Encounter: Payer: Self-pay | Admitting: Hematology and Oncology

## 2018-05-24 ENCOUNTER — Inpatient Hospital Stay: Payer: Medicare HMO

## 2018-05-24 DIAGNOSIS — R140 Abdominal distension (gaseous): Secondary | ICD-10-CM

## 2018-05-24 DIAGNOSIS — E538 Deficiency of other specified B group vitamins: Secondary | ICD-10-CM

## 2018-05-24 DIAGNOSIS — K5900 Constipation, unspecified: Secondary | ICD-10-CM

## 2018-05-24 DIAGNOSIS — Z801 Family history of malignant neoplasm of trachea, bronchus and lung: Secondary | ICD-10-CM

## 2018-05-24 DIAGNOSIS — K219 Gastro-esophageal reflux disease without esophagitis: Secondary | ICD-10-CM

## 2018-05-24 DIAGNOSIS — T451X5A Adverse effect of antineoplastic and immunosuppressive drugs, initial encounter: Secondary | ICD-10-CM

## 2018-05-24 DIAGNOSIS — I10 Essential (primary) hypertension: Secondary | ICD-10-CM

## 2018-05-24 DIAGNOSIS — G620 Drug-induced polyneuropathy: Secondary | ICD-10-CM

## 2018-05-24 DIAGNOSIS — C562 Malignant neoplasm of left ovary: Secondary | ICD-10-CM

## 2018-05-24 DIAGNOSIS — M898X9 Other specified disorders of bone, unspecified site: Secondary | ICD-10-CM | POA: Diagnosis not present

## 2018-05-24 DIAGNOSIS — M129 Arthropathy, unspecified: Secondary | ICD-10-CM

## 2018-05-24 DIAGNOSIS — Z8052 Family history of malignant neoplasm of bladder: Secondary | ICD-10-CM

## 2018-05-24 DIAGNOSIS — Z7689 Persons encountering health services in other specified circumstances: Secondary | ICD-10-CM | POA: Diagnosis not present

## 2018-05-24 DIAGNOSIS — K5909 Other constipation: Secondary | ICD-10-CM

## 2018-05-24 DIAGNOSIS — Z803 Family history of malignant neoplasm of breast: Secondary | ICD-10-CM

## 2018-05-24 DIAGNOSIS — E039 Hypothyroidism, unspecified: Secondary | ICD-10-CM

## 2018-05-24 DIAGNOSIS — E119 Type 2 diabetes mellitus without complications: Secondary | ICD-10-CM

## 2018-05-24 DIAGNOSIS — Z800 Family history of malignant neoplasm of digestive organs: Secondary | ICD-10-CM

## 2018-05-24 DIAGNOSIS — Z5111 Encounter for antineoplastic chemotherapy: Secondary | ICD-10-CM | POA: Diagnosis not present

## 2018-05-24 DIAGNOSIS — R109 Unspecified abdominal pain: Secondary | ICD-10-CM

## 2018-05-24 LAB — CMP (CANCER CENTER ONLY)
ALBUMIN: 3.9 g/dL (ref 3.5–5.0)
ALT: 19 U/L (ref 0–44)
AST: 17 U/L (ref 15–41)
Alkaline Phosphatase: 82 U/L (ref 38–126)
Anion gap: 9 (ref 5–15)
BUN: 12 mg/dL (ref 8–23)
CO2: 23 mmol/L (ref 22–32)
Calcium: 9.2 mg/dL (ref 8.9–10.3)
Chloride: 108 mmol/L (ref 98–111)
Creatinine: 0.69 mg/dL (ref 0.44–1.00)
GFR, Est AFR Am: >60 mL/min (ref >60)
GFR, Estimated: >60 mL/min (ref >60)
GLUCOSE: 82 mg/dL (ref 70–99)
POTASSIUM: 4.4 mmol/L (ref 3.5–5.1)
Sodium: 140 mmol/L (ref 135–145)
Total Bilirubin: 0.5 mg/dL (ref 0.3–1.2)
Total Protein: 6.5 g/dL (ref 6.5–8.1)

## 2018-05-24 LAB — CBC WITH DIFFERENTIAL (CANCER CENTER ONLY)
BASOS ABS: 0.1 10*3/uL (ref 0.0–0.1)
BASOS PCT: 1 %
EOS PCT: 1 %
Eosinophils Absolute: 0.1 10*3/uL (ref 0.0–0.5)
HEMATOCRIT: 33.9 % — AB (ref 34.8–46.6)
Hemoglobin: 11.5 g/dL — ABNORMAL LOW (ref 11.6–15.9)
Lymphocytes Relative: 23 %
Lymphs Abs: 2.1 10*3/uL (ref 0.9–3.3)
MCH: 32 pg (ref 25.1–34.0)
MCHC: 33.9 g/dL (ref 31.5–36.0)
MCV: 94.4 fL (ref 79.5–101.0)
MONO ABS: 0.6 10*3/uL (ref 0.1–0.9)
Monocytes Relative: 7 %
NEUTROS ABS: 6.4 10*3/uL (ref 1.5–6.5)
Neutrophils Relative %: 68 %
PLATELETS: 229 10*3/uL (ref 145–400)
RBC: 3.59 MIL/uL — ABNORMAL LOW (ref 3.70–5.45)
RDW: 19.2 % — AB (ref 11.2–14.5)
WBC: 9.3 10*3/uL (ref 3.9–10.3)

## 2018-05-24 MED ORDER — HYDROCODONE-ACETAMINOPHEN 5-325 MG PO TABS: 1.0000 | ORAL_TABLET | Freq: Four times a day (QID) | ORAL | 0 refills | Status: DC | PRN

## 2018-05-24 NOTE — Progress Notes (Signed)
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  We will observe closely and she will continue gabapentin  Other constipation She had worsening constipation after each cycle of treatment She was started on Linzess with good success of helping her to regulate her bowel movement After chemotherapy, I suspect she might have recurrence of severe constipation again and recommend combination therapy with Colace, MiraLAX, Senokot along with Linzess as needed   No orders of the defined types were placed in this encounter.   INTERVAL HISTORY: Please see below for problem oriented charting. She is seen prior to chemotherapy She was recently seen urgently due to severe constipation despite laxatives She was started on Linzess. X-ray was negative Since then, her bowel habits has become more regular Denies worsening peripheral neuropathy No recent infection, fever or chills No recent nausea Her bone pain is stable with current prescription pain medicine  SUMMARY OF ONCOLOGIC HISTORY: Oncology History   Neg BRCA test on tumor     Malignant neoplasm of left ovary ([place])   01/31/2018 Pathology Results    1. 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 - TUMOR INVOLVES OVARY SURFACE AND LEFT FALLOPIAN TUBE. - SEE ONCOLOGY TABLE. 2. Mesentery, small bowel  mesentery biopsy #1 - BENIGN FIBROADIPOSE TISSUE. 3. Ovary and fallopian tube, right - BENIGN OVARY WITH INCLUSION CYSTS. - BENIGN FALLOPIAN TUBE WITH PARATUBAL CYSTS AND ADENOFIBROMA. 4. Omentum, resection for tumor - BENIGN ADIPOSE TISSUE. 5. Peritoneum, biopsy, left diaphragmatic - BENIGN PERITONEAL TYPE TISSUE. 6. Peritoneum, biopsy, right diaphragmatic - BENIGN PERITONEAL TYPE TISSUE. 7. Mesentery, small bowel mesenteric biopsy #2 - BENIGN FIBROADIPOSE TISSUE. 8. Lymph nodes, regional resection, right pelvic - FIVE OF FIVE LYMPH NODES NEGATIVE FOR CARCINOMA (0/5). 9. Lymph nodes, regional resection, left pelvic - FOUR OF FOUR LYMPH NODES NEGATIVE FOR CARCINOMA (0/4). 10. Peritoneum, biopsy, left gutter - BENIGN PERITONEAL TYPE TISSUE. 11. Peritoneum, biopsy, bladder - BENIGN PERITONEAL TYPE TISSUE WITH ACUTE INFLAMMATION AND CALCIFICATIONS.  12. Peritoneum, biopsy, cul-de-sac - BENIGN PERITONEAL TYPE TISSUE WITH ACUTE INFLAMMATION AND CALCIFICATIONS. 13. Soft tissue, biopsy, right gutter - BENIGN FIBROADIPOSE TISSUE. 14. Lymph node, biopsy, right para-aortic - TWO OF TWO LYMPH NODES NEGATIVE FOR CARCINOMA (0/2). 15. Lymph node, biopsy, left para-aortic - ONE OF ONE LYMPH NODES NEGATIVE FOR CARCINOMA (0/1). Microscopic Comment 1. OVARY Specimen(s): Left ovary and fallopian tube. Procedure: (including lymph node sampling): Bilateral salpingo-oophorectomy with omental and peritoneal biopsies and lymph node biopsies. Primary tumor site (including laterality): Left ovary. Ovarian surface involvement: Present. Ovarian capsule intact without fragmentation: Intact. Maximum tumor size (cm): 11 cm total. Histologic type: Serous cystadenocarcinoma. Grade: High grade (low grade areas also present). Peritoneal implants: (specify invasive or non-invasive): N/A. Pelvic extension (list additional structures on separate lines and if involved): Left fallopian tube. Lymph nodes: number  examined 12 ; number positive 0 TNM code: pT2a, pN0, pMX FIGO Stage (based on pathologic findings, needs clinical correlation):  IIA Comments: None.    01/31/2018 Pathology Results    PERITONEAL WASHING (SPECIMEN 1 OF 1 COLLECTED 01/31/18): MALIGNANT CELLS PRESENT CONSISTENT WITH METASTATIC CARCINOMA.    01/31/2018 Surgery    Procedure(s) Performed:  1. Robotic BSO and washings. 2. Exploratory laparotomy, Staging including infragastric omentectomy, Bilateral pelvic and paraaortic lymphadenectomy, pertioneal biopsies.  Specimens: Bilateral tubes / ovaries, bilateral pelvic and paraaortic lymph nodes, peritoneal biopsies, washings and omentum.  Operative Findings: The left adnexa was adherent to the left pelvic peritoneum suspected due to the inferior retraction from the vaginal hysterectomy. Intraoperative leakage of cystic fluid from left ovary.  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  The right adnexa grossly appeared normal although slightly enlarged for her age.  There were some indurated lymph nodes but no overtly malignant lymph nodes were suspected.  Small bowel mesentery had 3-4 superficial possible implants.  1 of these was sent for frozen section returned benign.  She did have adhesive disease from her open cholecystectomy in the right upper quadrant.  No other evidence of disease in the abdomen or pelvis on palpation; specifically including the small bowel stomach and large bowel     01/31/2018 Genetic Testing    Patient has genetic testing done for BRCA mutation on tumor sample Results revealed patient has no mutation    02/05/2018 Imaging    CT scan of abdomen 1. 16 mm exophytic lesion upper pole right kidney has attenuation too high to be a simple cyst. This may be a cyst complicated by proteinaceous debris or hemorrhage, but renal cell carcinoma is a concern. Routine outpatient follow-up MRI of the abdomen without and with contrast  recommended after resolution of patient's acute symptoms (so she is better able to participate with positioning and breath holding). 2. Mild edema/possible minimal hemorrhage in the extraperitoneal soft tissues of the pelvic for tracking up both pelvic sidewalls. Imaging appearance is not outside of the spectrum of findings expected 6 days after the reported surgery. Trace interloop mesenteric and free fluid is identified in the peritoneal cavity in there is some mild edema in the omentum. There is no organized or rim enhancing collection to suggest evolving abscess. 3. Gas in the extraperitoneal soft tissues of the left lower quadrant is associated with gas in the subcutaneous fat of the lower left anterior abdominal wall. This is not unexpected on postoperative day 6. No evidence for intraperitoneal free air. 4. No CT features to suggest small bowel obstruction. Oral contrast material has migrated about halfway through the small bowel loops but there is no differential distention of proximal versus distal small bowel. The colon is diffusely distended and fluid-filled proximally, but formed stool is noted in the distal colon. Given apparent slow migration of contrast and fluid-filled small and large bowel loops, a component of ileus is possible.    02/14/2018 Imaging    MR abdomen Benign Bosniak category 2 cyst in upper pole of right kidney, corresponding with lesion seen on previous CT. No evidence of renal neoplasm or other significant abnormality.     02/18/2018 Cancer Staging    Staging form: Ovary, Fallopian Tube, and Primary Peritoneal Carcinoma, AJCC 8th Edition - Pathologic: Stage II (pT2, pN0, cM0) - Signed by Heath Lark, MD on 02/18/2018    02/28/2018 Procedure    Status post right IJ port catheter placement. Catheter ready for use     REVIEW OF SYSTEMS:   Constitutional: Denies fevers, chills or abnormal weight loss Eyes: Denies  blurriness of vision Ears, nose, mouth, throat, and face:  Denies mucositis or sore throat Respiratory: Denies cough, dyspnea or wheezes Cardiovascular: Denies palpitation, chest discomfort or lower extremity swelling Skin: Denies abnormal skin rashes Lymphatics: Denies new lymphadenopathy or easy bruising Neurological:Denies numbness, tingling or new weaknesses Behavioral/Psych: Mood is stable, no new changes  All other systems were reviewed with the patient and are negative.  I have reviewed the past medical history, past surgical history, social history and family history with the patient and they are unchanged from previous note.  ALLERGIES:  is allergic to penicillins.  MEDICATIONS:  Current Outpatient Medications  Medication Sig Dispense Refill  . ALPRAZolam (XANAX) 0.5 MG tablet Take 0.5 mg by mouth at bedtime.   5  . atorvastatin (LIPITOR) 20 MG tablet Take 20 mg by mouth every evening.     . Cholecalciferol (VITAMIN D) 2000 units tablet Take 2,000 Units by mouth daily.    . Cyanocobalamin (B-12 COMPLIANCE INJECTION) 1000 MCG/ML KIT Inject 1,000 mcg as directed every 30 (thirty) days.    Marland Kitchen dexamethasone (DECADRON) 4 MG tablet Take 3 tabs the night before and 3 tabs the morning of chemotherapy, every 3 weeks, with food 60 tablet 0  . docusate sodium (COLACE) 100 MG capsule Take 200 mg by mouth daily as needed (severe constipation).    Marland Kitchen exenatide (BYETTA) 10 MCG/0.04ML SOPN injection Inject 10 mcg into the skin 2 (two) times daily.     . feeding supplement, GLUCERNA SHAKE, (GLUCERNA SHAKE) LIQD Take 237 mLs by mouth daily.    Marland Kitchen FLUoxetine (PROZAC) 20 MG capsule Take 20 mg by mouth every morning.    Marland Kitchen FLUOXETINE HCL PO Take 20 mg by mouth daily.    Marland Kitchen gabapentin (NEURONTIN) 300 MG capsule Take 300 mg by mouth 2 (two) times daily.    Marland Kitchen HYDROcodone-acetaminophen (NORCO/VICODIN) 5-325 MG tablet Take 1 tablet by mouth every 6 (six) hours as needed for moderate pain. 60 tablet 0  . levothyroxine (SYNTHROID, LEVOTHROID) 100 MCG tablet Take 100 mcg  by mouth daily before breakfast.    . lidocaine-prilocaine (EMLA) cream Apply to affected area once 30 g 3  . linaclotide (LINZESS) 72 MCG capsule Take 1 capsule (72 mcg total) by mouth daily before breakfast. 30 capsule 2  . losartan (COZAAR) 100 MG tablet Take 100 mg by mouth every morning.    . Melatonin 5 MG TABS Take 5-10 mg by mouth at bedtime.    . metFORMIN (GLUCOPHAGE-XR) 500 MG 24 hr tablet Take 1,000 mg by mouth 2 (two) times daily.    . naproxen (NAPROSYN) 250 MG tablet Take 1 tablet (250 mg total) by mouth 2 (two) times daily with a meal. 60 tablet 2  . omeprazole (PRILOSEC OTC) 20 MG tablet Take 20 mg by mouth daily.    . ondansetron (ZOFRAN) 4 MG tablet Take 1 tablet (4 mg total) by mouth every 8 (eight) hours as needed for nausea or vomiting. 20 tablet 0  . ondansetron (ZOFRAN) 8 MG tablet Take 1 tablet (8 mg total) by mouth 2 (two) times daily as needed for refractory nausea / vomiting. Start on day 3 after chemo. 30 tablet 1  . potassium chloride (K-DUR,KLOR-CON) 10 MEQ tablet Take 10 mEq by mouth every evening.     . prochlorperazine (COMPAZINE) 10 MG tablet Take 1 tablet (10 mg total) by mouth every 6 (six) hours as needed (Nausea or vomiting). 30 tablet 1  . SENNA PO Take 3 tablets by mouth  daily as needed (severe constipation).    . vitamin E 400 UNIT capsule Take 400 Units by mouth daily.     No current facility-administered medications for this visit.     PHYSICAL EXAMINATION: ECOG PERFORMANCE STATUS: 1 - Symptomatic but completely ambulatory  Vitals:   05/24/18 1031  BP: (!) 138/56  Pulse: 74  Resp: 18  Temp: 98.3 F (36.8 C)  SpO2: 96%   Filed Weights   05/24/18 1031  Weight: 176 lb 9.6 oz (80.1 kg)    GENERAL:alert, no distress and comfortable SKIN: skin color, texture, turgor are normal, no rashes or significant lesions EYES: normal, Conjunctiva are pink and non-injected, sclera clear OROPHARYNX:no exudate, no erythema and lips, buccal mucosa, and  tongue normal  NECK: supple, thyroid normal size, non-tender, without nodularity LYMPH:  no palpable lymphadenopathy in the cervical, axillary or inguinal LUNGS: clear to auscultation and percussion with normal breathing effort HEART: regular rate & rhythm and no murmurs and no lower extremity edema ABDOMEN:abdomen soft, non-tender and normal bowel sounds Musculoskeletal:no cyanosis of digits and no clubbing  NEURO: alert & oriented x 3 with fluent speech, no focal motor/sensory deficits  LABORATORY DATA:  I have reviewed the data as listed    Component Value Date/Time   NA 140 05/24/2018 1014   K 4.4 05/24/2018 1014   CL 108 05/24/2018 1014   CO2 23 05/24/2018 1014   GLUCOSE 82 05/24/2018 1014   BUN 12 05/24/2018 1014   CREATININE 0.69 05/24/2018 1014   CALCIUM 9.2 05/24/2018 1014   PROT 6.5 05/24/2018 1014   ALBUMIN 3.9 05/24/2018 1014   AST 17 05/24/2018 1014   ALT 19 05/24/2018 1014   ALKPHOS 82 05/24/2018 1014   BILITOT 0.5 05/24/2018 1014   GFRNONAA >60 05/24/2018 1014   GFRAA >60 05/24/2018 1014    No results found for: SPEP, UPEP  Lab Results  Component Value Date   WBC 9.3 05/24/2018   NEUTROABS 6.4 05/24/2018   HGB 11.5 (L) 05/24/2018   HCT 33.9 (L) 05/24/2018   MCV 94.4 05/24/2018   PLT 229 05/24/2018      Chemistry      Component Value Date/Time   NA 140 05/24/2018 1014   K 4.4 05/24/2018 1014   CL 108 05/24/2018 1014   CO2 23 05/24/2018 1014   BUN 12 05/24/2018 1014   CREATININE 0.69 05/24/2018 1014      Component Value Date/Time   CALCIUM 9.2 05/24/2018 1014   ALKPHOS 82 05/24/2018 1014   AST 17 05/24/2018 1014   ALT 19 05/24/2018 1014   BILITOT 0.5 05/24/2018 1014       RADIOGRAPHIC STUDIES: I have personally reviewed the radiological images as listed and agreed with the findings in the report. Dg Abd 2 Views  Result Date: 05/16/2018 CLINICAL DATA:  Constipation since abdominal surgery 4 months ago. EXAM: ABDOMEN - 2 VIEW COMPARISON:   Abdominal radiographs 03/01/2018. FINDINGS: The lung bases are clear. The heart size is normal. The bowel gas pattern is normal. There is no evidence of free air. No radio-opaque calculi or other significant radiographic abnormality is seen. Degenerative changes are again noted within the lumbar spine. IMPRESSION: Negative two view abdomen radiographs. Electronically Signed   By: San Morelle M.D.   On: 05/16/2018 14:49    All questions were answered. The patient knows to call the clinic with any problems, questions or concerns. No barriers to learning was detected.  I spent 15 minutes counseling the patient face  to face. The total time spent in the appointment was 20 minutes and more than 50% was on counseling and review of test results  Heath Lark, MD 05/24/2018 1:45 PM

## 2018-05-24 NOTE — Assessment & Plan Note (Signed)
She has intermittent bone pain secondary to Taxol and G-CSF I refilled her recent prescription pain medicine to take as needed

## 2018-05-24 NOTE — Assessment & Plan Note (Signed)
She tolerated last cycle of therapy well except for severe constipation and intermittent bone pain after treatment Genetics results are pending We will proceed with treatment without further dose adjustment

## 2018-05-24 NOTE — Assessment & Plan Note (Signed)
She denies worsening peripheral neuropathy.  We will observe closely and she will continue gabapentin

## 2018-05-24 NOTE — Assessment & Plan Note (Signed)
She had worsening constipation after each cycle of treatment She was started on Linzess with good success of helping her to regulate her bowel movement After chemotherapy, I suspect she might have recurrence of severe constipation again and recommend combination therapy with Colace, MiraLAX, Senokot along with Linzess as needed

## 2018-05-27 ENCOUNTER — Telehealth: Payer: Self-pay | Admitting: Oncology

## 2018-05-27 ENCOUNTER — Inpatient Hospital Stay: Payer: Medicare HMO

## 2018-05-27 ENCOUNTER — Telehealth: Payer: Self-pay | Admitting: Genetics

## 2018-05-27 VITALS — BP 151/77 | HR 85 | Temp 97.7°F | Resp 18

## 2018-05-27 DIAGNOSIS — C562 Malignant neoplasm of left ovary: Secondary | ICD-10-CM

## 2018-05-27 DIAGNOSIS — Z5111 Encounter for antineoplastic chemotherapy: Secondary | ICD-10-CM | POA: Diagnosis not present

## 2018-05-27 MED ORDER — HEPARIN SOD (PORK) LOCK FLUSH 100 UNIT/ML IV SOLN
500.0000 [IU] | Freq: Once | INTRAVENOUS | Status: AC | PRN
  Administered 2018-05-27: 500 [IU]
  Filled 2018-05-27: qty 5

## 2018-05-27 MED ORDER — PALONOSETRON HCL INJECTION 0.25 MG/5ML
INTRAVENOUS | Status: AC
  Filled 2018-05-27: qty 5

## 2018-05-27 MED ORDER — DIPHENHYDRAMINE HCL 50 MG/ML IJ SOLN
INTRAMUSCULAR | Status: AC
Start: 2018-05-27 — End: ?
  Filled 2018-05-27: qty 1

## 2018-05-27 MED ORDER — PALONOSETRON HCL INJECTION 0.25 MG/5ML
0.2500 mg | Freq: Once | INTRAVENOUS | Status: AC
  Administered 2018-05-27: 0.25 mg via INTRAVENOUS

## 2018-05-27 MED ORDER — SODIUM CHLORIDE 0.9 % IV SOLN
Freq: Once | INTRAVENOUS | Status: AC
  Administered 2018-05-27: 09:00:00 via INTRAVENOUS
  Filled 2018-05-27: qty 250

## 2018-05-27 MED ORDER — SODIUM CHLORIDE 0.9% FLUSH
10.0000 mL | INTRAVENOUS | Status: DC | PRN
  Administered 2018-05-27: 10 mL
  Filled 2018-05-27: qty 10

## 2018-05-27 MED ORDER — FAMOTIDINE IN NACL 20-0.9 MG/50ML-% IV SOLN
20.0000 mg | Freq: Once | INTRAVENOUS | Status: AC
  Administered 2018-05-27: 20 mg via INTRAVENOUS

## 2018-05-27 MED ORDER — FAMOTIDINE IN NACL 20-0.9 MG/50ML-% IV SOLN
INTRAVENOUS | Status: AC
  Filled 2018-05-27: qty 50

## 2018-05-27 MED ORDER — SODIUM CHLORIDE 0.9 % IV SOLN
140.0000 mg/m2 | Freq: Once | INTRAVENOUS | Status: AC
  Administered 2018-05-27: 270 mg via INTRAVENOUS
  Filled 2018-05-27: qty 45

## 2018-05-27 MED ORDER — FOSAPREPITANT DIMEGLUMINE INJECTION 150 MG
Freq: Once | INTRAVENOUS | Status: AC
  Administered 2018-05-27: 09:00:00 via INTRAVENOUS
  Filled 2018-05-27: qty 5

## 2018-05-27 MED ORDER — DIPHENHYDRAMINE HCL 50 MG/ML IJ SOLN
25.0000 mg | Freq: Once | INTRAMUSCULAR | Status: AC
  Administered 2018-05-27: 25 mg via INTRAVENOUS

## 2018-05-27 MED ORDER — SODIUM CHLORIDE 0.9 % IV SOLN
510.0000 mg | Freq: Once | INTRAVENOUS | Status: AC
  Administered 2018-05-27: 510 mg via INTRAVENOUS
  Filled 2018-05-27: qty 51

## 2018-05-27 NOTE — Patient Instructions (Signed)
   [facility] Discharge Instructions for Patients Receiving Chemotherapy  Today you received the following chemotherapy agents Taxol and Carboplatin   To help prevent nausea and vomiting after your treatment, we encourage you to take your nausea medication as directed.    If you develop nausea and vomiting that is not controlled by your nausea medication, call the clinic.   BELOW ARE SYMPTOMS THAT SHOULD BE REPORTED IMMEDIATELY:  *FEVER GREATER THAN 100.5 F  *CHILLS WITH OR WITHOUT FEVER  NAUSEA AND VOMITING THAT IS NOT CONTROLLED WITH YOUR NAUSEA MEDICATION  *UNUSUAL SHORTNESS OF BREATH  *UNUSUAL BRUISING OR BLEEDING  TENDERNESS IN MOUTH AND THROAT WITH OR WITHOUT PRESENCE OF ULCERS  *URINARY PROBLEMS  *BOWEL PROBLEMS  UNUSUAL RASH Items with * indicate a potential emergency and should be followed up as soon as possible.  Feel free to call the clinic should you have any questions or concerns. The clinic phone number is [phone].  Please show the CHEMO ALERT CARD at check-in to the Emergency Department and triage nurse.   

## 2018-05-27 NOTE — Telephone Encounter (Signed)
Patient was calling for her genetic test results- I informed her they are still processing in the lab.  Average time is about 4-6 weeks.  It has been 6 weeks and we reached out to the lab once it was overdue to ask for updates and push along her result if possible.    Provided her with the number for the lab per her request.  We will be in contact as soon as her results are available.  We explained that it is not abnormal for it to take a little longer to get results and it is not necessarily a 'bad' or 'good' sign that it is taking a while.

## 2018-05-27 NOTE — Telephone Encounter (Signed)
[name] called and asked if her genetic testing results were back yet.  Advised her that I did not see them yet but will also check with Ferol Luz, Genetic Counselor.  Called Ria Comment and she said she will contact [facility] if they are back.  Called Ailyn back and let her know that Ria Comment will contact her once the results are back.

## 2018-05-29 ENCOUNTER — Inpatient Hospital Stay: Payer: Medicare HMO

## 2018-05-29 VITALS — BP 127/62 | HR 75 | Temp 97.8°F | Resp 18

## 2018-05-29 DIAGNOSIS — C562 Malignant neoplasm of left ovary: Secondary | ICD-10-CM

## 2018-05-29 DIAGNOSIS — Z5111 Encounter for antineoplastic chemotherapy: Secondary | ICD-10-CM | POA: Diagnosis not present

## 2018-05-29 MED ORDER — PEGFILGRASTIM-CBQV 6 MG/0.6ML ~~LOC~~ SOSY
6.0000 mg | PREFILLED_SYRINGE | Freq: Once | SUBCUTANEOUS | Status: AC
  Administered 2018-05-29: 6 mg via SUBCUTANEOUS

## 2018-05-29 MED ORDER — PEGFILGRASTIM-CBQV 6 MG/0.6ML ~~LOC~~ SOSY
PREFILLED_SYRINGE | SUBCUTANEOUS | Status: AC
  Filled 2018-05-29: qty 0.6

## 2018-05-29 NOTE — Patient Instructions (Signed)
Pegfilgrastim injection What is this medicine? PEGFILGRASTIM (PEG fil gra stim) is a long-acting granulocyte colony-stimulating factor that stimulates the growth of neutrophils, a type of white blood cell important in the body's fight against infection. It is used to reduce the incidence of fever and infection in patients with certain types of cancer who are receiving chemotherapy that affects the bone marrow, and to increase survival after being exposed to high doses of radiation. This medicine may be used for other purposes; ask your health care provider or pharmacist if you have questions. COMMON BRAND NAME(S): Neulasta What should I tell my health care provider before I take this medicine? They need to know if you have any of these conditions: -kidney disease -latex allergy -ongoing radiation therapy -sickle cell disease -skin reactions to acrylic adhesives (On-Body Injector only) -an unusual or allergic reaction to pegfilgrastim, filgrastim, other medicines, foods, dyes, or preservatives -pregnant or trying to get pregnant -breast-feeding How should I use this medicine? This medicine is for injection under the skin. If you get this medicine at home, you will be taught how to prepare and give the pre-filled syringe or how to use the On-body Injector. Refer to the patient Instructions for Use for detailed instructions. Use exactly as directed. Tell your healthcare provider immediately if you suspect that the On-body Injector may not have performed as intended or if you suspect the use of the On-body Injector resulted in a missed or partial dose. It is important that you put your used needles and syringes in a special sharps container. Do not put them in a trash can. If you do not have a sharps container, call your pharmacist or healthcare provider to get one. Talk to your pediatrician regarding the use of this medicine in children. While this drug may be prescribed for selected conditions,  precautions do apply. Overdosage: If you think you have taken too much of this medicine contact a poison control center or emergency room at once. NOTE: This medicine is only for you. Do not share this medicine with others. What if I miss a dose? It is important not to miss your dose. Call your doctor or health care professional if you miss your dose. If you miss a dose due to an On-body Injector failure or leakage, a new dose should be administered as soon as possible using a single prefilled syringe for manual use. What may interact with this medicine? Interactions have not been studied. Give your health care provider a list of all the medicines, herbs, non-prescription drugs, or dietary supplements you use. Also tell them if you smoke, drink alcohol, or use illegal drugs. Some items may interact with your medicine. This list may not describe all possible interactions. Give your health care provider a list of all the medicines, herbs, non-prescription drugs, or dietary supplements you use. Also tell them if you smoke, drink alcohol, or use illegal drugs. Some items may interact with your medicine. What should I watch for while using this medicine? You may need blood work done while you are taking this medicine. If you are going to need a MRI, CT scan, or other procedure, tell your doctor that you are using this medicine (On-Body Injector only). What side effects may I notice from receiving this medicine? Side effects that you should report to your doctor or health care professional as soon as possible: -allergic reactions like skin rash, itching or hives, swelling of the face, lips, or tongue -dizziness -fever -pain, redness, or irritation at site   where injected -pinpoint red spots on the skin -red or dark-brown urine -shortness of breath or breathing problems -stomach or side pain, or pain at the shoulder -swelling -tiredness -trouble passing urine or change in the amount of urine Side  effects that usually do not require medical attention (report to your doctor or health care professional if they continue or are bothersome): -bone pain -muscle pain This list may not describe all possible side effects. Call your doctor for medical advice about side effects. You may report side effects to FDA at 1-800-FDA-1088. Where should I keep my medicine? Keep out of the reach of children. Store pre-filled syringes in a refrigerator between 2 and 8 degrees C (36 and 46 degrees F). Do not freeze. Keep in carton to protect from light. Throw away this medicine if it is left out of the refrigerator for more than 48 hours. Throw away any unused medicine after the expiration date. NOTE: This sheet is a summary. It may not cover all possible information. If you have questions about this medicine, talk to your doctor, pharmacist, or health care provider.  2018 Elsevier/Gold Standard (2016-09-28 12:58:03)  

## 2018-06-03 ENCOUNTER — Ambulatory Visit: Payer: Self-pay | Admitting: Genetics

## 2018-06-03 ENCOUNTER — Encounter: Payer: Self-pay | Admitting: Genetics

## 2018-06-03 ENCOUNTER — Telehealth: Payer: Self-pay | Admitting: Genetics

## 2018-06-03 DIAGNOSIS — Z1379 Encounter for other screening for genetic and chromosomal anomalies: Secondary | ICD-10-CM

## 2018-06-03 HISTORY — DX: Encounter for other screening for genetic and chromosomal anomalies: Z13.79

## 2018-06-03 NOTE — Progress Notes (Addendum)
HPI:  [name] was previously seen in the [facility] on 04/01/2018 due to a personal and family history of cancer and concerns regarding a hereditary predisposition to cancer. Please refer to our prior cancer genetics clinic note for more information regarding Ms. Jamerson's medical, social and family histories, and our assessment and recommendations, at the time. Ms. Leyh recent genetic test results were disclosed to her, as well as recommendations warranted by these results. These results and recommendations are discussed in more detail below.  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 - TUMOR INVOLVES OVARY SURFACE AND LEFT FALLOPIAN TUBE. - SEE ONCOLOGY TABLE. 2. Mesentery, small bowel mesentery biopsy #1 - BENIGN FIBROADIPOSE TISSUE. 3. Ovary and fallopian tube, right - BENIGN OVARY WITH INCLUSION CYSTS. - BENIGN FALLOPIAN TUBE WITH PARATUBAL CYSTS AND ADENOFIBROMA. 4. Omentum, resection for tumor - BENIGN ADIPOSE TISSUE. 5. Peritoneum, biopsy, left diaphragmatic - BENIGN PERITONEAL TYPE TISSUE. 6. Peritoneum, biopsy, right diaphragmatic - BENIGN PERITONEAL TYPE TISSUE. 7. Mesentery, small bowel mesenteric biopsy #2 - BENIGN FIBROADIPOSE TISSUE. 8. Lymph nodes, regional resection, right pelvic - FIVE OF FIVE LYMPH NODES NEGATIVE FOR CARCINOMA (0/5). 9. Lymph nodes, regional resection, left pelvic - FOUR OF FOUR LYMPH NODES NEGATIVE FOR CARCINOMA (0/4). 10. Peritoneum, biopsy, left gutter - BENIGN PERITONEAL TYPE TISSUE. 11. Peritoneum, biopsy, bladder - BENIGN PERITONEAL TYPE TISSUE WITH ACUTE INFLAMMATION AND CALCIFICATIONS.  12. Peritoneum, biopsy, cul-de-sac - BENIGN PERITONEAL TYPE TISSUE WITH ACUTE INFLAMMATION AND CALCIFICATIONS. 13. Soft tissue,  biopsy, right gutter - BENIGN FIBROADIPOSE TISSUE. 14. Lymph node, biopsy, right para-aortic - TWO OF TWO LYMPH NODES NEGATIVE FOR CARCINOMA (0/2). 15. Lymph node, biopsy, left para-aortic - ONE OF ONE LYMPH NODES NEGATIVE FOR CARCINOMA (0/1). Microscopic Comment 1. OVARY Specimen(s): Left ovary and fallopian tube. Procedure: (including lymph node sampling): Bilateral salpingo-oophorectomy with omental and peritoneal biopsies and lymph node biopsies. Primary tumor site (including laterality): Left ovary. Ovarian surface involvement: Present. Ovarian capsule intact without fragmentation: Intact. Maximum tumor size (cm): 11 cm total. Histologic type: Serous cystadenocarcinoma. Grade: High grade (low grade areas also present). Peritoneal implants: (specify invasive or non-invasive): N/A. Pelvic extension (list additional structures on separate lines and if involved): Left fallopian tube. Lymph nodes: number examined 12 ; number positive 0 TNM code: pT2a, pN0, pMX FIGO Stage (based on pathologic findings, needs clinical correlation): IIA Comments: None.    01/31/2018 Pathology Results    PERITONEAL WASHING (SPECIMEN 1 OF 1 COLLECTED 01/31/18): MALIGNANT CELLS PRESENT CONSISTENT WITH METASTATIC CARCINOMA.    01/31/2018 Surgery    Procedure(s) Performed:  1. Robotic BSO and washings. 2. Exploratory laparotomy, Staging including infragastric omentectomy, Bilateral pelvic and paraaortic lymphadenectomy, pertioneal biopsies.  Specimens: Bilateral tubes / ovaries, bilateral pelvic and paraaortic lymph nodes, peritoneal biopsies, washings and omentum.  Operative Findings: The left adnexa was adherent to the left pelvic peritoneum suspected due to the inferior retraction from the vaginal hysterectomy. Intraoperative leakage of cystic fluid from left ovary.  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  The right adnexa grossly appeared normal although  slightly enlarged for her age.  There were some indurated lymph nodes but no overtly malignant lymph nodes were suspected.  Small bowel mesentery had 3-4 superficial possible implants.  1  of these was sent for frozen section returned benign.  She did have adhesive disease from her open cholecystectomy in the right upper quadrant.  No other evidence of disease in the abdomen or pelvis on palpation; specifically including the small bowel stomach and large bowel     01/31/2018 Genetic Testing    Patient has genetic testing done for BRCA mutation on tumor sample Results revealed patient has no mutation    02/05/2018 Imaging    CT scan of abdomen 1. 16 mm exophytic lesion upper pole right kidney has attenuation too high to be a simple cyst. This may be a cyst complicated by proteinaceous debris or hemorrhage, but renal cell carcinoma is a concern. Routine outpatient follow-up MRI of the abdomen without and with contrast recommended after resolution of patient's acute symptoms (so she is better able to participate with positioning and breath holding). 2. Mild edema/possible minimal hemorrhage in the extraperitoneal soft tissues of the pelvic for tracking up both pelvic sidewalls. Imaging appearance is not outside of the spectrum of findings expected 6 days after the reported surgery. Trace interloop mesenteric and free fluid is identified in the peritoneal cavity in there is some mild edema in the omentum. There is no organized or rim enhancing collection to suggest evolving abscess. 3. Gas in the extraperitoneal soft tissues of the left lower quadrant is associated with gas in the subcutaneous fat of the lower left anterior abdominal wall. This is not unexpected on postoperative day 6. No evidence for intraperitoneal free air. 4. No CT features to suggest small bowel obstruction. Oral contrast material has migrated about halfway through the small bowel loops but there is no differential distention of proximal  versus distal small bowel. The colon is diffusely distended and fluid-filled proximally, but formed stool is noted in the distal colon. Given apparent slow migration of contrast and fluid-filled small and large bowel loops, a component of ileus is possible.    02/14/2018 Imaging    MR abdomen Benign Bosniak category 2 cyst in upper pole of right kidney, corresponding with lesion seen on previous CT. No evidence of renal neoplasm or other significant abnormality.     02/18/2018 Cancer Staging    Staging form: Ovary, Fallopian Tube, and Primary Peritoneal Carcinoma, AJCC 8th Edition - Pathologic: Stage II (pT2, pN0, cM0) - Signed by Heath Lark, MD on 02/18/2018    02/28/2018 Procedure    Status post right IJ port catheter placement. Catheter ready for use      FAMILY HISTORY:  We obtained a detailed, 4-generation family history.  Significant diagnoses are listed below: Family History  Problem Relation Age of Onset  . Lymphoma Mother 40  . Bladder Cancer Sister 5  . Prostate cancer Brother 2       has surgery right away after dx  . Leukemia Brother        70's  . Breast cancer Other 26       niece, GT 2017 reportedly neg  . Prostate cancer Father 40       found on autopsy at death (4)  . Lung cancer Maternal Aunt   . Esophageal cancer Maternal Uncle   . Other Maternal Grandfather 75       cerebral hemmhorage  . Lung cancer Cousin   . Lung cancer Cousin   . Leukemia Cousin     Ms. Ruff has a 42 year-old daughter and a 12 year-old son with no history of cancer.  Ms. Dilley has 4 grandchildren  ages 65, 73, 22, and 33.   Ms. Chaney has a sister who is in her 18's and was just diagnosed with bladder cancer at 36.  She has a daughter (pateint's niece) who developed breast cancer at the age of 26.  She reportedly had genetic testing in 2017 that was negative.  Ms. Mcneil has a brother who had prostate cancer at 64.  He had surgery right away to remove it, and then developed leukemia.   He died shortly after due to leukemia. He had a daughter who is deceased with no history of cancer.    Ms. Werst's father: was murdered at 30.  Upon autopsy they found he had prostate cancer.  Paternal Aunts/Uncles: 2 paternal aunts, no history of cancer. Both died >50 due to stroke/diabetes/ age related disease.  Paternal cousins: no history of cancer.  Paternal grandfather: died in his 32's with no history of cancer.  Paternal grandmother: died in her 65's with no history of cancer.   Ms. Shands mother: died at 49.  She had lymphoma dx at about 54.  She had a hysterectomy in her 71's.  Maternal Aunts/Uncles: 1 maternal aunt died of lung cancer. 2 maternal uncles, 1 had esophageal cancer. Maternal cousins: 2 maternal cousins had lung cancer, 1 maternal cousin had leukemia.  Maternal grandfather: died of a cerebral hemorrhage in his 58's.  Maternal grandmother:died in her 7's with no history of cancer.   Patient's maternal ancestors are of [place] descent, and paternal ancestors are of [place] descent. There is no reported Ashkenazi Jewish ancestry. There is no known consanguinity.  Ms. Mellette reports that on her maternal grandfather's side of the family 3 siblings married 3 siblings of another family-no consanguinity, but she has some 'double cousins' on this side of the family.    GENETIC TEST RESULTS: Genetic testing was performed through [facility] reported out on 06/03/2018 showed no pathogenic mutations.   Germline Results:  Negative, No pathogenic variants detected.   Germline Genes Analyzed: ATM, BARD1, BRIP1, CHEK2, MRE11A, NBN, PALB2, RAD51C, RAD51D, BRCA1, BRCA2, APC,BMPR1A, CDH1, CDKN2A, DICER1, MLH1, MSH2, MSH6, MUTYH, PMS2, PTEN, RAD50, SMAD4, STK11, TP53, CDK4, NF1, POLD1,POLE, SMARCA4, HOXB13 (sequencing and deletion/duplication); EPCAM, GREM1 (deletion/duplication only).    Somatic Results (HRD): No variants identified.   BRCA1 Promoter  Hypermethylation-Absent RAD51C Promoter Hypermethylation- Present  Note: A complex profile of copy number changes was identified in this tumor sample.  However, the exact size and breakpoints could not be defined with the current testing methodology.  These findings suggest acquired genomic instability or aneuploidy of the tumor genome.  See test report for further commentary.    Somatic Genes Analyzed : ATM, BARD1, BRIP1, CHEK2, MRE11A, NBN, PALB2, RAD51C, RAD51D, BRCA1, BRCA2 (sequencing and deletion/duplication).  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  This likely causes homologous repair deficiency. There currently there are no FDA approved therapies that directly target loss/inactivation of RAD51C, but it has previously been associated with PARP-inhibitor response in other studies and clinical trials. See further commentary in the full test report scanned into molecular pathology.  We recommend Ms. Wolven discuss this finding with her oncologist to determine if this would impact her treatment options (WE:[id number] use).     ADDITIONAL GENETIC TESTING: We discussed with Ms. Swarey that there are other genes that are associated with increased cancer risk that can be analyzed. The laboratories that offer this testing look at these additional genes via a hereditary cancer  gene panel. Should Ms. Luck wish to pursue additional genetic testing, we are happy to discuss and coordinate this testing, at any time.    CANCER SCREENING RECOMMENDATIONS: Ms. Hawkey germline genetic test result is  negative (normal).  This means that we have not identified a hereditary cause for her personal and family history of cancer at this time.   While reassuring, this does not definitively rule out a hereditary predisposition to cancer. It is still possible that there could be genetic mutations that are undetectable by current technology, or  genetic mutations in genes that have not been tested or identified to increase cancer risk.  Therefore, it is recommended she continue to follow the cancer management and screening guidelines provided by her oncology and primary healthcare provider. An individual's cancer risk is not determined by genetic test results alone.  Overall cancer risk assessment includes additional factors such as personal medical history, family history, etc.  These should be used to make a personalized plan for cancer prevention and surveillance.    RECOMMENDATIONS FOR FAMILY MEMBERS:  Relatives in this family might be at some increased risk of developing cancer, over the general population risk, simply due to the family history of cancer.  We recommended women in this family have a yearly mammogram beginning at age 28, or 27 years younger than the earliest onset of cancer, an annual clinical breast exam, and perform monthly breast self-exams. Women in this family should also have a gynecological exam as recommended by their primary provider. All family members should have a colonoscopy by age 12 (or as directed by their doctors).  All family members should inform their physicians about the family history of cancer so their doctors can make the most appropriate screening recommendations for them.   It is also possible there is a hereditary cause for the cancer in Ms. Weins's family that she did not inherit and therefore was not identified in her.   Therefore, we recommended her siblings and other relatives (especially those affected with caner) also have genetic counseling and testing. Ms. Berninger will let us know if we can be of any assistance in coordinating genetic counseling and/or testing for these family members.   FOLLOW-UP: Lastly, we discussed with Ms. Betancur that cancer genetics is a rapidly advancing field and it is possible that new genetic tests will be appropriate for her and/or her family members in the future. We  encouraged her to remain in contact with cancer genetics on an annual basis so we can update her personal and family histories and let her know of advances in cancer genetics that may benefit this family.   Our contact number was provided. Ms. Smejkal questions were answered to her satisfaction, and she knows she is welcome to call us at anytime with additional questions or concerns.   Ferol Luz, MS, [facility] Certified Genetic Counselor [email or web address]

## 2018-06-03 NOTE — Telephone Encounter (Signed)
[name]'s genetic test results came in this morning.  We shared that her germline results were negative/ normal. This means it is unlikely [name]'s ovarian cancer was due to a hereditary cause.  However, we discussed genetic testing is not perfect and cannot definitively rule out a hereditary cause.   We shared that [name]'s tumor results did not find a BRCA1/2 mutation in her tumor.  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  This change does not have as much data about it as the BRCA1/2 mutations.  This mutation has been associated with HRD and PARP inhibitor response in previous studies, but has not been approved by the FDA as an indication for PARP at this point.   We recommended further discussion with her oncologist to determine how this may or may not impact her treatment options moving forward regarding PARP inhibitors.

## 2018-06-04 ENCOUNTER — Telehealth: Payer: Self-pay | Admitting: Oncology

## 2018-06-04 NOTE — Telephone Encounter (Signed)
I will try to address all her concerns in her next visit I hope she feels better soon

## 2018-06-04 NOTE — Telephone Encounter (Signed)
Patient advised of message below.  She verbalized understanding and agreement.

## 2018-06-04 NOTE — Telephone Encounter (Signed)
[name] called and wanted to give an update on her status after chemotherapy.  She said she had joint pain after her "WBC shot" from Thursday through Saturday and does not want the shot next time. 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  She had back spasms last night as well and almost went to the ER. She took 2 tablets of hydrocodone and also a muscle relaxer that helped.  She said she had night sweats last night where her pillow was soaked and now feels better.  She denies having a fever.  She wants to let [name] know how she has been feeling and asked if her next dose of chemotherapy could possibly be broken up into two infusions.  She is worried about the neuropathy becoming permanent but wants to finish treatment.

## 2018-06-06 ENCOUNTER — Telehealth: Payer: Self-pay | Admitting: Oncology

## 2018-06-06 NOTE — Telephone Encounter (Signed)
[name] called and said she is having neuropathy in her feet.  She was given gabapentin by her family doctor and was told to initially take 300 mg BID and go up to 300 mg TID if needed.  She has been taking 300 mg TID but missed doses this week because her pill box was not filled correctly.  She did take 3 doses yesterday and felt better. She would like to know what [name] recommends for gabapentin dosing.   She also said she has been very emotional with crying for the past 5 days and feels weak and tired.  She is wondering if this is normal.

## 2018-06-06 NOTE — Telephone Encounter (Signed)
Continue gabapentin TID and I will adjust dose as needed Crying spells are likely related to her anxiety; I will address in her next visit Does she has lorazepam to take as needed?

## 2018-06-06 NOTE — Telephone Encounter (Addendum)
Left a message for patient with Dr. Calton Dach recommendations below.  Patient does take Xanax at bedtime and Prozac daily.

## 2018-06-07 ENCOUNTER — Ambulatory Visit: Payer: Medicare HMO | Admitting: Obstetrics

## 2018-06-10 ENCOUNTER — Telehealth: Payer: Self-pay | Admitting: Oncology

## 2018-06-10 NOTE — Telephone Encounter (Signed)
Patient notified of message below from [name].

## 2018-06-10 NOTE — Telephone Encounter (Signed)
Please proceed to taper to twice a day

## 2018-06-10 NOTE — Telephone Encounter (Signed)
[name] called and asked how to taper down on gabapentin.  She is currently taking 300 mg TID and said she feels dizzy even when sitting and has not been able to sleep for the past 3 nights even with taking Xanax and melatonin.  She said her bp is good.  She thinks the gabapentin is causing her symptoms and is wondering how to taper back down to twice a day.  She asked if she needs to slowly taper or if she can just stop taking the third capsule. She also said her neuropathy is better.

## 2018-06-18 ENCOUNTER — Telehealth: Payer: Self-pay | Admitting: Hematology and Oncology

## 2018-06-18 ENCOUNTER — Encounter: Payer: Self-pay | Admitting: Oncology

## 2018-06-18 ENCOUNTER — Inpatient Hospital Stay (HOSPITAL_BASED_OUTPATIENT_CLINIC_OR_DEPARTMENT_OTHER): Payer: Medicare HMO | Admitting: Hematology and Oncology

## 2018-06-18 ENCOUNTER — Inpatient Hospital Stay: Payer: Medicare HMO | Attending: Gynecologic Oncology

## 2018-06-18 ENCOUNTER — Inpatient Hospital Stay: Payer: Medicare HMO

## 2018-06-18 ENCOUNTER — Encounter: Payer: Self-pay | Admitting: Hematology and Oncology

## 2018-06-18 VITALS — BP 103/54 | HR 78 | Temp 98.1°F | Resp 18 | Ht 64.5 in | Wt 172.4 lb

## 2018-06-18 DIAGNOSIS — Z5111 Encounter for antineoplastic chemotherapy: Secondary | ICD-10-CM | POA: Insufficient documentation

## 2018-06-18 DIAGNOSIS — F329 Major depressive disorder, single episode, unspecified: Secondary | ICD-10-CM

## 2018-06-18 DIAGNOSIS — C562 Malignant neoplasm of left ovary: Secondary | ICD-10-CM

## 2018-06-18 DIAGNOSIS — G620 Drug-induced polyneuropathy: Secondary | ICD-10-CM | POA: Diagnosis not present

## 2018-06-18 DIAGNOSIS — K5909 Other constipation: Secondary | ICD-10-CM

## 2018-06-18 DIAGNOSIS — T451X5A Adverse effect of antineoplastic and immunosuppressive drugs, initial encounter: Secondary | ICD-10-CM | POA: Diagnosis not present

## 2018-06-18 DIAGNOSIS — F339 Major depressive disorder, recurrent, unspecified: Secondary | ICD-10-CM

## 2018-06-18 DIAGNOSIS — M898X9 Other specified disorders of bone, unspecified site: Secondary | ICD-10-CM

## 2018-06-18 LAB — CBC WITH DIFFERENTIAL (CANCER CENTER ONLY)
BASOS PCT: 1 %
Basophils Absolute: 0 10*3/uL (ref 0.0–0.1)
EOS PCT: 0 %
Eosinophils Absolute: 0 10*3/uL (ref 0.0–0.5)
HEMATOCRIT: 34.5 % — AB (ref 34.8–46.6)
Hemoglobin: 11.8 g/dL (ref 11.6–15.9)
LYMPHS PCT: 8 %
Lymphs Abs: 0.6 10*3/uL — ABNORMAL LOW (ref 0.9–3.3)
MCH: 32.7 pg (ref 25.1–34.0)
MCHC: 34.1 g/dL (ref 31.5–36.0)
MCV: 96 fL (ref 79.5–101.0)
Monocytes Absolute: 0 10*3/uL — ABNORMAL LOW (ref 0.1–0.9)
Monocytes Relative: 1 %
NEUTROS ABS: 7.1 10*3/uL — AB (ref 1.5–6.5)
Neutrophils Relative %: 90 %
Platelet Count: 223 10*3/uL (ref 145–400)
RBC: 3.59 MIL/uL — ABNORMAL LOW (ref 3.70–5.45)
RDW: 18.7 % — AB (ref 11.2–14.5)
WBC Count: 7.7 10*3/uL (ref 3.9–10.3)

## 2018-06-18 LAB — CMP (CANCER CENTER ONLY)
ALBUMIN: 4.2 g/dL (ref 3.5–5.0)
ALK PHOS: 75 U/L (ref 38–126)
ALT: 10 U/L (ref 0–44)
ANION GAP: 13 (ref 5–15)
AST: 10 U/L — AB (ref 15–41)
BUN: 20 mg/dL (ref 8–23)
CALCIUM: 10.2 mg/dL (ref 8.9–10.3)
CO2: 23 mmol/L (ref 22–32)
Chloride: 102 mmol/L (ref 98–111)
Creatinine: 0.77 mg/dL (ref 0.44–1.00)
GFR, Est AFR Am: >60 mL/min (ref >60)
GFR, Estimated: >60 mL/min (ref >60)
Glucose, Bld: 156 mg/dL — ABNORMAL HIGH (ref 70–99)
Potassium: 3.8 mmol/L (ref 3.5–5.1)
SODIUM: 138 mmol/L (ref 135–145)
Total Bilirubin: 0.6 mg/dL (ref 0.3–1.2)
Total Protein: 7.2 g/dL (ref 6.5–8.1)

## 2018-06-18 MED ORDER — SODIUM CHLORIDE 0.9% FLUSH
10.0000 mL | Freq: Once | INTRAVENOUS | Status: AC
  Administered 2018-06-18: 10 mL
  Filled 2018-06-18: qty 10

## 2018-06-18 MED ORDER — SODIUM CHLORIDE 0.9 % IV SOLN
Freq: Once | INTRAVENOUS | Status: AC
  Administered 2018-06-18: 12:00:00 via INTRAVENOUS
  Filled 2018-06-18: qty 250

## 2018-06-18 MED ORDER — FAMOTIDINE IN NACL 20-0.9 MG/50ML-% IV SOLN
INTRAVENOUS | Status: AC
  Filled 2018-06-18: qty 50

## 2018-06-18 MED ORDER — DIPHENHYDRAMINE HCL 50 MG/ML IJ SOLN
25.0000 mg | Freq: Once | INTRAMUSCULAR | Status: AC
  Administered 2018-06-18: 25 mg via INTRAVENOUS

## 2018-06-18 MED ORDER — DIPHENHYDRAMINE HCL 50 MG/ML IJ SOLN
INTRAMUSCULAR | Status: AC
  Filled 2018-06-18: qty 1

## 2018-06-18 MED ORDER — SODIUM CHLORIDE 0.9% FLUSH
10.0000 mL | INTRAVENOUS | Status: DC | PRN
  Administered 2018-06-18: 10 mL
  Filled 2018-06-18: qty 10

## 2018-06-18 MED ORDER — HEPARIN SOD (PORK) LOCK FLUSH 100 UNIT/ML IV SOLN
500.0000 [IU] | Freq: Once | INTRAVENOUS | Status: AC | PRN
  Administered 2018-06-18: 500 [IU]
  Filled 2018-06-18: qty 5

## 2018-06-18 MED ORDER — PALONOSETRON HCL INJECTION 0.25 MG/5ML
INTRAVENOUS | Status: AC
  Filled 2018-06-18: qty 5

## 2018-06-18 MED ORDER — FAMOTIDINE IN NACL 20-0.9 MG/50ML-% IV SOLN
20.0000 mg | Freq: Once | INTRAVENOUS | Status: AC
  Administered 2018-06-18: 20 mg via INTRAVENOUS

## 2018-06-18 MED ORDER — PALONOSETRON HCL INJECTION 0.25 MG/5ML
0.2500 mg | Freq: Once | INTRAVENOUS | Status: AC
  Administered 2018-06-18: 0.25 mg via INTRAVENOUS

## 2018-06-18 MED ORDER — SODIUM CHLORIDE 0.9 % IV SOLN
510.0000 mg | Freq: Once | INTRAVENOUS | Status: AC
  Administered 2018-06-18: 510 mg via INTRAVENOUS
  Filled 2018-06-18: qty 51

## 2018-06-18 MED ORDER — SODIUM CHLORIDE 0.9 % IV SOLN
Freq: Once | INTRAVENOUS | Status: AC
  Administered 2018-06-18: 14:00:00 via INTRAVENOUS
  Filled 2018-06-18: qty 5

## 2018-06-18 NOTE — Progress Notes (Signed)
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 and I would plan to do some research to see if she would qualify for PARP inhibitor for maintenance therapy  Bone pain due to G-CSF She had recent severe bone pain due to G-CSF We will discontinue G-CSF as without Taxol, she will not need growth factor support  Neuropathy due to chemotherapeutic drug ([place]) She tolerated Taxol poorly with worsening peripheral neuropathy She did not tolerate gabapentin I recommend reducing gabapentin to 300 mg at night only and to take pain medicine as needed for symptom management  Other constipation She continues to have intermittent constipation after treatment She will continue laxative therapy  Major depressive disorder, recurrent ([place]) She had significant crying spells at home recently due to side effects of treatment Hopefully, with discontinuation of chemotherapy, her symptoms will improve.  I continue to encourage patient   Orders Placed This Encounter  Procedures  . CT CHEST W CONTRAST    Standing Status:   Future    Standing Expiration Date:   06/19/2019    Order Specific Question:   If indicated for the ordered procedure, I authorize the administration of contrast media per Radiology protocol    Answer:   Yes    Order Specific Question:   Preferred imaging location?    Answer:   [facility]    Order  Specific Question:   Radiology Contrast Protocol - do NOT remove file path    Answer:   \\[email or web address]  . CT ABDOMEN PELVIS W CONTRAST    Standing Status:   Future    Standing Expiration Date:   06/19/2019    Order Specific Question:   If indicated for the ordered procedure, I authorize the administration of contrast media per Radiology protocol    Answer:   Yes    Order Specific Question:   Preferred imaging location?    Answer:   [facility]    Order Specific Question:   Radiology Contrast Protocol - do NOT remove file path    Answer:   \\[email or web address]  . CA 125    Standing Status:   Standing    Number of Occurrences:   9    Standing Expiration Date:   06/19/2019    INTERVAL HISTORY: Please see below for problem oriented charting. She returns for final chemotherapy today She complained of profound side effects from treatment Due to neuropathy, she was started on gabapentin but she tolerated that poorly with profound dizziness Her neuropathy is worse and now starting to affect her fingers and she described neuropathy as numbness and burning sensation She also had profound bone pain, requiring scheduled narcotic prescription Her constipation was stable with chronic laxative management She has lost some weight She also have recent crying spells due to side effects of treatment but appears motivated to complete her treatment today She denies recent infection, fever or chills  SUMMARY OF ONCOLOGIC HISTORY: Oncology History   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 - TUMOR INVOLVES OVARY SURFACE AND LEFT FALLOPIAN TUBE. - SEE ONCOLOGY TABLE. 2. Mesentery, small bowel mesentery biopsy #1 - BENIGN FIBROADIPOSE TISSUE. 3. Ovary and  fallopian tube, right - BENIGN OVARY WITH INCLUSION CYSTS. - BENIGN FALLOPIAN TUBE WITH PARATUBAL CYSTS AND ADENOFIBROMA. 4. Omentum, resection for tumor - BENIGN ADIPOSE TISSUE. 5. Peritoneum, biopsy, left diaphragmatic - BENIGN PERITONEAL TYPE TISSUE. 6. Peritoneum, biopsy, right diaphragmatic - BENIGN PERITONEAL TYPE TISSUE. 7. Mesentery, small bowel mesenteric biopsy #2 - BENIGN FIBROADIPOSE TISSUE. 8. Lymph nodes, regional resection, right pelvic - FIVE OF FIVE LYMPH NODES NEGATIVE FOR CARCINOMA (0/5). 9. Lymph nodes, regional resection, left pelvic - FOUR OF FOUR LYMPH NODES NEGATIVE FOR CARCINOMA (0/4). 10. Peritoneum, biopsy, left gutter - BENIGN PERITONEAL TYPE TISSUE. 11. Peritoneum, biopsy, bladder - BENIGN PERITONEAL TYPE TISSUE WITH ACUTE INFLAMMATION AND CALCIFICATIONS.  12. Peritoneum, biopsy, cul-de-sac - BENIGN PERITONEAL TYPE TISSUE WITH ACUTE INFLAMMATION AND CALCIFICATIONS. 13. Soft tissue, biopsy, right gutter - BENIGN FIBROADIPOSE TISSUE. 14. Lymph node, biopsy, right para-aortic - TWO OF TWO LYMPH NODES NEGATIVE FOR CARCINOMA (0/2). 15. Lymph node, biopsy, left para-aortic - ONE OF ONE LYMPH NODES NEGATIVE FOR CARCINOMA (0/1). Microscopic Comment 1. OVARY Specimen(s): Left ovary and fallopian tube. Procedure: (including lymph node sampling): Bilateral salpingo-oophorectomy with omental and peritoneal biopsies and lymph node biopsies. Primary tumor site (including laterality): Left ovary. Ovarian surface involvement: Present. Ovarian capsule intact without fragmentation: Intact. Maximum tumor size (cm): 11 cm total. Histologic type: Serous cystadenocarcinoma. Grade: High grade (low grade areas also present). Peritoneal implants: (specify invasive or non-invasive): N/A. Pelvic extension (list additional structures on separate lines and if involved): Left fallopian tube. Lymph nodes: number examined 12 ; number positive 0 TNM code: pT2a, pN0, pMX FIGO  Stage (based on pathologic findings, needs clinical correlation): IIA Comments: None.    01/31/2018 Pathology Results    PERITONEAL WASHING (SPECIMEN 1 OF 1 COLLECTED 01/31/18): MALIGNANT CELLS PRESENT CONSISTENT WITH METASTATIC CARCINOMA.    01/31/2018 Surgery    Procedure(s) Performed:  1. Robotic BSO and washings. 2. Exploratory laparotomy, Staging including infragastric omentectomy, Bilateral pelvic and paraaortic lymphadenectomy, pertioneal biopsies.  Specimens: Bilateral tubes / ovaries, bilateral pelvic and paraaortic lymph nodes, peritoneal biopsies, washings and omentum.  Operative Findings: The left adnexa was adherent to the left pelvic peritoneum suspected due to the inferior retraction from the vaginal hysterectomy. Intraoperative leakage of cystic fluid from left ovary.  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  The right adnexa grossly appeared normal although slightly enlarged for her age.  There were some indurated lymph nodes but no overtly malignant lymph nodes were suspected.  Small bowel mesentery had 3-4 superficial possible implants.  1 of these was sent for frozen section returned benign.  She did have adhesive disease from her open cholecystectomy in the right upper quadrant.  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 16 mm exophytic lesion upper pole right kidney has attenuation too high to be a simple cyst. This may be a cyst complicated by proteinaceous debris or hemorrhage, but renal cell carcinoma is a concern. Routine  outpatient follow-up MRI of the abdomen without and with contrast recommended after resolution of patient's acute symptoms (so she is better able to participate with positioning and breath holding). 2. Mild edema/possible minimal hemorrhage in the extraperitoneal soft tissues of the pelvic for tracking up both pelvic sidewalls. Imaging appearance is not outside of the spectrum of findings expected 6 days after the reported surgery. Trace interloop mesenteric and free fluid is identified in the peritoneal cavity in there is some mild edema in the omentum. There is no organized or rim enhancing collection to suggest evolving abscess. 3. Gas in the extraperitoneal soft tissues of the left lower quadrant is associated with gas in the subcutaneous fat of the lower left anterior abdominal wall. This is not unexpected on postoperative day 6. No evidence for intraperitoneal free air. 4. No CT features to suggest small bowel obstruction. Oral contrast material has migrated about halfway through the small bowel loops but there is no differential distention of proximal versus distal small bowel. The colon is diffusely distended and fluid-filled proximally, but formed stool is noted in the distal colon. Given apparent slow migration of contrast and fluid-filled small and large bowel loops, a component of ileus is possible.    02/14/2018 Imaging    MR abdomen Benign Bosniak category 2 cyst in upper pole of right kidney, corresponding with lesion seen on previous CT. No evidence of renal neoplasm or other significant abnormality.     02/18/2018 Cancer Staging    Staging form: Ovary, Fallopian Tube, and Primary Peritoneal Carcinoma, AJCC 8th Edition - Pathologic: Stage II (pT2, pN0, cM0) - Signed by Heath Lark, MD on 02/18/2018    02/28/2018 Procedure    Status post right IJ port catheter placement. Catheter ready for use     REVIEW OF SYSTEMS:   Constitutional: Denies fevers, chills or abnormal weight loss Eyes:  Denies blurriness of vision Ears, nose, mouth, throat, and face: Denies mucositis or sore throat Respiratory: Denies cough, dyspnea or wheezes Cardiovascular: Denies palpitation, chest discomfort or lower extremity swelling Skin: Denies abnormal skin rashes Lymphatics: Denies new lymphadenopathy or easy bruising All other systems were reviewed with the patient and are negative.  I have reviewed the past medical history, past surgical history, social history and family history with the patient and they are unchanged from previous note.  ALLERGIES:  is allergic to penicillins.  MEDICATIONS:  Current Outpatient Medications  Medication Sig Dispense Refill  . ALPRAZolam (XANAX) 0.5 MG tablet Take 0.5 mg by mouth at bedtime.   5  . atorvastatin (LIPITOR) 20 MG tablet Take 20 mg by mouth every evening.     . Cholecalciferol (VITAMIN D) 2000 units tablet Take 2,000 Units by mouth daily.    . Cyanocobalamin (B-12 COMPLIANCE INJECTION) 1000 MCG/ML KIT Inject 1,000 mcg as directed every 30 (thirty) days.    Marland Kitchen dexamethasone (DECADRON) 4 MG tablet Take 3 tabs the night before and 3 tabs the morning of chemotherapy, every 3 weeks, with food 60 tablet 0  . docusate sodium (COLACE) 100 MG capsule Take 200 mg by mouth daily as needed (severe constipation).    Marland Kitchen exenatide (BYETTA) 10 MCG/0.04ML SOPN injection Inject 10 mcg into the  skin 2 (two) times daily.     . feeding supplement, GLUCERNA SHAKE, (GLUCERNA SHAKE) LIQD Take 237 mLs by mouth daily.    Marland Kitchen FLUoxetine (PROZAC) 20 MG capsule Take 20 mg by mouth every morning.    Marland Kitchen FLUOXETINE HCL PO Take 20 mg by mouth daily.    Marland Kitchen gabapentin (NEURONTIN) 300 MG capsule Take 300 mg by mouth 2 (two) times daily.    Marland Kitchen HYDROcodone-acetaminophen (NORCO/VICODIN) 5-325 MG tablet Take 1 tablet by mouth every 6 (six) hours as needed for moderate pain. 60 tablet 0  . levothyroxine (SYNTHROID, LEVOTHROID) 100 MCG tablet Take 100 mcg by mouth daily before breakfast.    .  lidocaine-prilocaine (EMLA) cream Apply to affected area once 30 g 3  . losartan (COZAAR) 100 MG tablet Take 100 mg by mouth every morning.    . Melatonin 5 MG TABS Take 5-10 mg by mouth at bedtime.    . metFORMIN (GLUCOPHAGE-XR) 500 MG 24 hr tablet Take 1,000 mg by mouth 2 (two) times daily.    . naproxen (NAPROSYN) 250 MG tablet Take 1 tablet (250 mg total) by mouth 2 (two) times daily with a meal. 60 tablet 2  . omeprazole (PRILOSEC OTC) 20 MG tablet Take 20 mg by mouth daily.    . ondansetron (ZOFRAN) 4 MG tablet Take 1 tablet (4 mg total) by mouth every 8 (eight) hours as needed for nausea or vomiting. 20 tablet 0  . ondansetron (ZOFRAN) 8 MG tablet Take 1 tablet (8 mg total) by mouth 2 (two) times daily as needed for refractory nausea / vomiting. Start on day 3 after chemo. 30 tablet 1  . potassium chloride (K-DUR,KLOR-CON) 10 MEQ tablet Take 10 mEq by mouth every evening.     . prochlorperazine (COMPAZINE) 10 MG tablet Take 1 tablet (10 mg total) by mouth every 6 (six) hours as needed (Nausea or vomiting). 30 tablet 1  . SENNA PO Take 3 tablets by mouth daily as needed (severe constipation).    . vitamin E 400 UNIT capsule Take 400 Units by mouth daily.     No current facility-administered medications for this visit.    Facility-Administered Medications Ordered in Other Visits  Medication Dose Route Frequency Provider Last Rate Last Dose  . sodium chloride flush (NS) 0.9 % injection 10 mL  10 mL Intracatheter PRN Alvy Bimler, Rossana Molchan, MD   10 mL at 06/18/18 1516    PHYSICAL EXAMINATION: ECOG PERFORMANCE STATUS: 2 - Symptomatic, <50% confined to bed  Vitals:   06/18/18 0951  BP: (!) 103/54  Pulse: 78  Resp: 18  Temp: 98.1 F (36.7 C)  SpO2: 99%   Filed Weights   06/18/18 0951  Weight: 172 lb 6.4 oz (78.2 kg)    GENERAL:alert, no distress and comfortable SKIN: skin color, texture, turgor are normal, no rashes or significant lesions EYES: normal, Conjunctiva are pink and  non-injected, sclera clear OROPHARYNX:no exudate, no erythema and lips, buccal mucosa, and tongue normal  NECK: supple, thyroid normal size, non-tender, without nodularity LYMPH:  no palpable lymphadenopathy in the cervical, axillary or inguinal LUNGS: clear to auscultation and percussion with normal breathing effort HEART: regular rate & rhythm and no murmurs and no lower extremity edema ABDOMEN:abdomen soft, non-tender and normal bowel sounds Musculoskeletal:no cyanosis of digits and no clubbing  NEURO: alert & oriented x 3 with fluent speech, no focal motor/sensory deficits  LABORATORY DATA:  I have reviewed the data as listed    Component Value Date/Time  NA 138 06/18/2018 0905   K 3.8 06/18/2018 0905   CL 102 06/18/2018 0905   CO2 23 06/18/2018 0905   GLUCOSE 156 (H) 06/18/2018 0905   BUN 20 06/18/2018 0905   CREATININE 0.77 06/18/2018 0905   CALCIUM 10.2 06/18/2018 0905   PROT 7.2 06/18/2018 0905   ALBUMIN 4.2 06/18/2018 0905   AST 10 (L) 06/18/2018 0905   ALT 10 06/18/2018 0905   ALKPHOS 75 06/18/2018 0905   BILITOT 0.6 06/18/2018 0905   GFRNONAA >60 06/18/2018 0905   GFRAA >60 06/18/2018 0905    No results found for: SPEP, UPEP  Lab Results  Component Value Date   WBC 7.7 06/18/2018   NEUTROABS 7.1 (H) 06/18/2018   HGB 11.8 06/18/2018   HCT 34.5 (L) 06/18/2018   MCV 96.0 06/18/2018   PLT 223 06/18/2018      Chemistry      Component Value Date/Time   NA 138 06/18/2018 0905   K 3.8 06/18/2018 0905   CL 102 06/18/2018 0905   CO2 23 06/18/2018 0905   BUN 20 06/18/2018 0905   CREATININE 0.77 06/18/2018 0905      Component Value Date/Time   CALCIUM 10.2 06/18/2018 0905   ALKPHOS 75 06/18/2018 0905   AST 10 (L) 06/18/2018 0905   ALT 10 06/18/2018 0905   BILITOT 0.6 06/18/2018 0905       All questions were answered. The patient knows to call the clinic with any problems, questions or concerns. No barriers to learning was detected.  I spent 40 minutes  counseling the patient face to face. The total time spent in the appointment was 55 minutes and more than 50% was on counseling and review of test results  Heath Lark, MD 06/18/2018 4:00 PM

## 2018-06-18 NOTE — Assessment & Plan Note (Signed)
She had significant crying spells at home recently due to side effects of treatment Hopefully, with discontinuation of chemotherapy, her symptoms will improve.  I continue to encourage patient

## 2018-06-18 NOTE — Assessment & Plan Note (Signed)
She had recent severe bone pain due to G-CSF We will discontinue G-CSF as without Taxol, she will not need growth factor support

## 2018-06-18 NOTE — Telephone Encounter (Signed)
Per 9/3 los, also gave patient contrast solution for abdominal CT and info.

## 2018-06-18 NOTE — Assessment & Plan Note (Signed)
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

## 2018-06-18 NOTE — Assessment & Plan Note (Signed)
She tolerated Taxol poorly with worsening peripheral neuropathy She did not tolerate gabapentin I recommend reducing gabapentin to 300 mg at night only and to take pain medicine as needed for symptom management

## 2018-06-18 NOTE — Assessment & Plan Note (Signed)
She continues to have intermittent constipation after treatment She will continue laxative therapy

## 2018-06-18 NOTE — Telephone Encounter (Signed)
Gave patient avs and calendar.   °

## 2018-06-18 NOTE — Patient Instructions (Signed)
[facility] Discharge Instructions for Patients Receiving Chemotherapy  Today you received the following chemotherapy agents Carboplatin  To help prevent nausea and vomiting after your treatment, we encourage you to take your nausea medication as directed   If you develop nausea and vomiting that is not controlled by your nausea medication, call the clinic.   BELOW ARE SYMPTOMS THAT SHOULD BE REPORTED IMMEDIATELY:  *FEVER GREATER THAN 100.5 F  *CHILLS WITH OR WITHOUT FEVER  NAUSEA AND VOMITING THAT IS NOT CONTROLLED WITH YOUR NAUSEA MEDICATION  *UNUSUAL SHORTNESS OF BREATH  *UNUSUAL BRUISING OR BLEEDING  TENDERNESS IN MOUTH AND THROAT WITH OR WITHOUT PRESENCE OF ULCERS  *URINARY PROBLEMS  *BOWEL PROBLEMS  UNUSUAL RASH Items with * indicate a potential emergency and should be followed up as soon as possible.  Feel free to call the clinic should you have any questions or concerns. The clinic phone number is [phone].  Please show the CHEMO ALERT CARD at check-in to the Emergency Department and triage nurse.   

## 2018-06-20 ENCOUNTER — Ambulatory Visit: Payer: Medicare HMO

## 2018-06-24 ENCOUNTER — Other Ambulatory Visit: Payer: Self-pay

## 2018-06-24 MED ORDER — HYDROCODONE-ACETAMINOPHEN 5-325 MG PO TABS: 1.0000 | ORAL_TABLET | Freq: Four times a day (QID) | ORAL | 0 refills | Status: DC | PRN

## 2018-07-02 ENCOUNTER — Telehealth: Payer: Self-pay | Admitting: Oncology

## 2018-07-02 NOTE — Telephone Encounter (Signed)
[name] called and asked if she has been approved for a parp inhibitor and when it would start.  Advised her that [name] would like to see how her CT scan from 07/18/18 looks and is not planning on using a parp at this time.  Marka verbalized understanding and agreement.

## 2018-07-08 ENCOUNTER — Telehealth: Payer: Self-pay | Admitting: Oncology

## 2018-07-08 NOTE — Telephone Encounter (Signed)
I advise avoid resting during day time and walk if she can

## 2018-07-08 NOTE — Telephone Encounter (Signed)
I do not believe her labs would be significantly lower since we reduced her treatment last cycle She has deconditioning; I was planning to refer her to cancer rehab when I see her and after CT results are available Does she sleep well? Most patients with poor sleep feel worse

## 2018-07-08 NOTE — Telephone Encounter (Signed)
Advised patient of Dr. Calton Dach message. She said she falls asleep easily but has trouble staying asleep.  She said she always wakes up between 3:30 and 4:00 and can't go back to sleep.  She does take Xanax and melatonin at bedtime.

## 2018-07-08 NOTE — Telephone Encounter (Signed)
[name] called and said she has no energy and is wondering if this is normal.  She also does not feel like eating and is having lower back pain.  She is wondering if this is normal and if she should have labs and see [name] sooner than 07/18/18.

## 2018-07-14 ENCOUNTER — Encounter (HOSPITAL_COMMUNITY): Payer: Self-pay | Admitting: Nurse Practitioner

## 2018-07-14 ENCOUNTER — Emergency Department (HOSPITAL_COMMUNITY): Payer: Medicare HMO

## 2018-07-14 ENCOUNTER — Emergency Department (HOSPITAL_COMMUNITY)
Admission: EM | Admit: 2018-07-14 | Discharge: 2018-07-14 | Disposition: A | Discharge status: Home / Self Care | Payer: Medicare HMO | Attending: Emergency Medicine | Admitting: Emergency Medicine

## 2018-07-14 DIAGNOSIS — E039 Hypothyroidism, unspecified: Secondary | ICD-10-CM | POA: Diagnosis not present

## 2018-07-14 DIAGNOSIS — T82594A Other mechanical complication of infusion catheter, initial encounter: Secondary | ICD-10-CM | POA: Diagnosis present

## 2018-07-14 DIAGNOSIS — Z79899 Other long term (current) drug therapy: Secondary | ICD-10-CM | POA: Insufficient documentation

## 2018-07-14 DIAGNOSIS — I2510 Atherosclerotic heart disease of native coronary artery without angina pectoris: Secondary | ICD-10-CM | POA: Insufficient documentation

## 2018-07-14 DIAGNOSIS — Z8543 Personal history of malignant neoplasm of ovary: Secondary | ICD-10-CM | POA: Insufficient documentation

## 2018-07-14 DIAGNOSIS — Z452 Encounter for adjustment and management of vascular access device: Secondary | ICD-10-CM | POA: Insufficient documentation

## 2018-07-14 DIAGNOSIS — I10 Essential (primary) hypertension: Secondary | ICD-10-CM | POA: Diagnosis not present

## 2018-07-14 DIAGNOSIS — E119 Type 2 diabetes mellitus without complications: Secondary | ICD-10-CM | POA: Diagnosis not present

## 2018-07-14 DIAGNOSIS — Z9049 Acquired absence of other specified parts of digestive tract: Secondary | ICD-10-CM | POA: Diagnosis not present

## 2018-07-14 DIAGNOSIS — Z7984 Long term (current) use of oral hypoglycemic drugs: Secondary | ICD-10-CM | POA: Diagnosis not present

## 2018-07-14 DIAGNOSIS — Y829 Unspecified medical devices associated with adverse incidents: Secondary | ICD-10-CM | POA: Insufficient documentation

## 2018-07-14 NOTE — ED Provider Notes (Signed)
[name] Provider Note   CSN: [id number] Arrival date & time: 07/14/18  1525     History   Chief Complaint Chief Complaint  Patient presents with  . Vascular Access Problem    Port-A-Cath  . Ca Pt    HPI [name] is a 77 y.o. female.  The history is provided by the patient and medical records. No language interpreter was used.  Illness  This is a new problem. The current episode started 2 days ago. The problem occurs constantly. The problem has not changed since onset.Pertinent negatives include no chest pain, no abdominal pain, no headaches and no shortness of breath. Nothing aggravates the symptoms. Nothing relieves the symptoms. She has tried nothing for the symptoms. The treatment provided no relief.    Past Medical History:  Diagnosis Date  . Arthritis   . Diabetes mellitus without complication ([place])    type 2   . Family history of bladder cancer   . Family history of breast cancer   . Family history of esophageal cancer   . Family history of lung cancer   . Family history of prostate cancer   . GERD (gastroesophageal reflux disease)   . Hypertension   . Hypothyroidism   . Ovarian cancer ([place]) 01/31/2018  . Pneumonia    its been 4 years   . PONV (postoperative nausea and vomiting)   . Vitamin B 12 deficiency     Patient Active Problem List   Diagnosis Date Noted  . Genetic testing 06/03/2018  . Family history of prostate cancer   . Family history of bladder cancer   . Family history of lung cancer   . Family history of esophageal cancer   . Family history of breast cancer   . Bone pain due to G-CSF 03/25/2018  . 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 Chemotherapy adverse reaction 03/11/2018  . Other constipation 03/01/2018  . Elevated liver function tests 02/06/2018  . Postoperative nausea and vomiting 02/05/2018  . Malignant neoplasm of left ovary ([facility])   . Post-menopause 02/21/2016  .  Obesity (BMI 30-39.9) 12/14/2015  . Acquired hypothyroidism 12/13/2015  . Atherosclerosis 12/13/2015  . Degeneration of lumbar intervertebral disc 12/13/2015  . Diabetes mellitus with peripheral circulatory disorder ([place]) 12/13/2015  . Essential hypertension 12/13/2015  . GERD without esophagitis 12/13/2015  . High risk medication use 12/13/2015  . Major depressive disorder, recurrent ([place]) 12/13/2015  . Malaise and fatigue 12/13/2015  . Mixed hyperlipidemia 12/13/2015  . Posterior tibial tendonitis 12/13/2015  . Primary insomnia 12/13/2015  . Primary osteoarthritis involving multiple joints 12/13/2015  . Vitamin B12 deficiency 12/13/2015  . Postoperative anemia due to acute blood loss 12/10/2014  . 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 BLADDER SUSPENSION  2002  . BREAST SURGERY     several benign cysts removed; surgeries from [place]   . CHOLECYSTECTOMY  1988  . EYE SURGERY  2010 amd 2012   cataracts removed bilateral   . IR FLUORO GUIDE PORT INSERTION RIGHT  02/28/2018  . IR US GUIDE VASC ACCESS RIGHT  02/28/2018  . KNEE ARTHROSCOPY Left    l knee x2  . PARATHYROIDECTOMY  2010  . ROBOTIC ASSISTED SALPINGO OOPHERECTOMY Bilateral 01/31/2018   Procedure: XI ROBOTIC ASSISTED BILATERAL SALPINGO OOPHORECTOMY, STAGING, LAPAROTOMY, PELVIC AND PARA AORTIC LYMPH NODE DISSECTION, OMENTECTOMY;  Surgeon: Isabel Caprice, MD;  Location: [facility];  Service:  Gynecology;  Laterality: Bilateral;  . ROTATOR CUFF REPAIR  2005   right   . TOTAL KNEE ARTHROPLASTY Left 12/08/2014   Procedure: LEFT TOTAL KNEE ARTHROPLASTY;  Surgeon: Tobi Bastos, MD;  Location: [facility];  Service: Orthopedics;  Laterality: Left;  Marland Kitchen VAGINAL HYSTERECTOMY  1984     OB History   None      Home Medications    Prior to Admission medications   Medication Sig Start Date End Date Taking? Authorizing Provider  ALPRAZolam Duanne Moron) 0.5 MG tablet Take 0.5 mg  by mouth at bedtime.  10/17/14   [provider]  atorvastatin (LIPITOR) 20 MG tablet Take 20 mg by mouth every evening.     [provider]  Cholecalciferol (VITAMIN D) 2000 units tablet Take 2,000 Units by mouth daily.    [provider]  Cyanocobalamin (B-12 COMPLIANCE INJECTION) 1000 MCG/ML KIT Inject 1,000 mcg as directed every 30 (thirty) days.    [provider]  dexamethasone (DECADRON) 4 MG tablet Take 3 tabs the night before and 3 tabs the morning of chemotherapy, every 3 weeks, with food 03/01/18   Heath Lark, MD  docusate sodium (COLACE) 100 MG capsule Take 200 mg by mouth daily as needed (severe constipation).    [provider]  exenatide (BYETTA) 10 MCG/0.04ML SOPN injection Inject 10 mcg into the skin 2 (two) times daily.  10/11/17   [provider]  feeding supplement, GLUCERNA SHAKE, (GLUCERNA SHAKE) LIQD Take 237 mLs by mouth daily.    [provider]  FLUoxetine (PROZAC) 20 MG capsule Take 20 mg by mouth every morning.    [provider]  FLUOXETINE HCL PO Take 20 mg by mouth daily.    [provider]  gabapentin (NEURONTIN) 300 MG capsule Take 300 mg by mouth 2 (two) times daily.    [provider]  HYDROcodone-acetaminophen (NORCO/VICODIN) 5-325 MG tablet Take 1 tablet by mouth every 6 (six) hours as needed for moderate pain. 06/24/18   Heath Lark, MD  levothyroxine (SYNTHROID, LEVOTHROID) 100 MCG tablet Take 100 mcg by mouth daily before breakfast.    [provider]  lidocaine-prilocaine (EMLA) cream Apply to affected area once 03/01/18   Heath Lark, MD  losartan (COZAAR) 100 MG tablet Take 100 mg by mouth every morning.    [provider]  Melatonin 5 MG TABS Take 5-10 mg by mouth at bedtime.    [provider]  metFORMIN (GLUCOPHAGE-XR) 500 MG 24 hr tablet Take 1,000 mg by mouth 2 (two) times daily.    [provider]  naproxen (NAPROSYN) 250 MG tablet  Take 1 tablet (250 mg total) by mouth 2 (two) times daily with a meal. 03/07/18   Cross, Melissa D, NP  omeprazole (PRILOSEC OTC) 20 MG tablet Take 20 mg by mouth daily.    [provider]  ondansetron (ZOFRAN) 4 MG tablet Take 1 tablet (4 mg total) by mouth every 8 (eight) hours as needed for nausea or vomiting. 02/11/18   Cross, Lenna Sciara D, NP  ondansetron (ZOFRAN) 8 MG tablet Take 1 tablet (8 mg total) by mouth 2 (two) times daily as needed for refractory nausea / vomiting. Start on day 3 after chemo. 03/01/18   Heath Lark, MD  potassium chloride (K-DUR,KLOR-CON) 10 MEQ tablet Take 10 mEq by mouth every evening.  01/23/18   [provider]  prochlorperazine (COMPAZINE) 10 MG tablet Take 1 tablet (10 mg total) by mouth every 6 (six) hours as needed (  Nausea or vomiting). 03/01/18   Heath Lark, MD  SENNA PO Take 3 tablets by mouth daily as needed (severe constipation).    [provider]  vitamin E 400 UNIT capsule Take 400 Units by mouth daily.    [provider]    Family History Family History  Problem Relation Age of Onset  . Lymphoma Mother 40  . Bladder Cancer Sister 22  . Prostate cancer Brother 38       has surgery right away after dx  . Leukemia Brother        22's  . Breast cancer Other 59       niece, GT 2017 reportedly neg  . Prostate cancer Father 43       found on autopsy at death (64)  . Lung cancer Maternal Aunt   . Esophageal cancer Maternal Uncle   . Other Maternal Grandfather 75       cerebral hemmhorage  . Lung cancer Cousin   . Lung cancer Cousin   . Leukemia Cousin     Social History Social History   Tobacco Use  . Smoking status: Never Smoker  . Smokeless tobacco: Never Used  Substance Use Topics  . Alcohol use: No  . Drug use: No     Allergies   Penicillins   Review of Systems Review of Systems  Constitutional: Negative for chills and fever.  HENT: Negative for congestion, ear pain and sore throat.   Eyes:  Negative for pain and visual disturbance.  Respiratory: Negative for cough and shortness of breath.   Cardiovascular: Negative for chest pain and palpitations.  Gastrointestinal: Negative for abdominal pain and vomiting.  Genitourinary: Negative for dysuria and hematuria.  Musculoskeletal: Positive for neck pain. Negative for arthralgias, back pain and neck stiffness.  Skin: Negative for color change, rash and wound.  Neurological: Negative for seizures, syncope, light-headedness and headaches.  All other systems reviewed and are negative.    Physical Exam Updated Vital Signs BP 136/90 (BP Location: Left Arm)   Pulse 81   Temp 98.1 F (36.7 C) (Oral)   Resp 18   SpO2 100%   Physical Exam  Constitutional: She is oriented to person, place, and time. She appears well-developed and well-nourished. No distress.  HENT:  Head: Normocephalic and atraumatic.  Mouth/Throat: Oropharynx is clear and moist. No oropharyngeal exudate.  Eyes: Conjunctivae are normal.  Neck: Neck supple.  Cardiovascular: Normal rate and regular rhythm.  No murmur heard. Pulmonary/Chest: Effort normal and breath sounds normal. No respiratory distress. She has no wheezes. She has no rales. She exhibits no tenderness.    Abdominal: Soft. There is no tenderness.  Musculoskeletal: She exhibits no edema or tenderness.  Neurological: She is alert and oriented to person, place, and time. No sensory deficit. She exhibits normal muscle tone.  Skin: Skin is warm and dry. No rash noted. She is not diaphoretic. No erythema.  Psychiatric: She has a normal mood and affect.  Nursing note and vitals reviewed.    ED Treatments / Results  Labs (all labs ordered are listed, but only abnormal results are displayed) Labs Reviewed - No data to display  EKG None  Radiology Dg Chest 2 View  Result Date: 07/14/2018 CLINICAL DATA:  Port-A-Cath placement EXAM: CHEST - 2 VIEW COMPARISON:  02/28/2018, radiograph 12/01/2014  FINDINGS: Right central venous port slightly changed in position since fluoroscopic image 02/28/2018. There is a sharper curve at the apex of the catheter and the tip of the  catheter is now positioned over the brachiocephalic confluence. There is no focal opacity or pleural effusion. The cardiomediastinal silhouette is normal. Aortic atherosclerosis. No pneumothorax. IMPRESSION: No active cardiopulmonary disease. Right-sided central venous port show slight interval change in position since fluoroscopic image 02/28/2018 as described above. Tip of the catheter now projects over the brachiocephalic confluence. Electronically Signed   By: Donavan Foil M.D.   On: 07/14/2018 16:55    Procedures Procedures (including critical care time)  Medications Ordered in ED Medications - No data to display   Initial Impression / Assessment and Plan / ED Course  I have reviewed the triage vital signs and the nursing notes.  Pertinent labs & imaging results that were available during my care of the patient were reviewed by me and considered in my medical decision making (see chart for details).     [name] is a 77 y.o. female with past medical history significant for ovarian cancer recently completing chemotherapy, hypertension, diabetes, and hypo-thyroidism who presents with concern for Port-A-Cath problem.  Patient reports that over the last few days she is felt the sensation of catheter moving into her neck.  She reports that it was irritating but not painful.  She denies any chest pain or shortness of breath.  She denies any new fevers, chills, palpitations, or GI symptoms.  She thought that there was some erythema initially at the site but it went away.  She is concerned because a friend palpated the catheter and thinks it shifted.  Patient is here to make sure that there is no emergency with this.    Patient does report that she has intermittent lightheadedness and dizziness that has been ongoing since  chemotherapy initiated months ago.  On exam, patient has a palpable catheter that is felt overlying the clavicle.  You can also feel the bulb for the injection.  She reports this does not feel to shifted.  Is nontender.  No erythema.  Surgical scar seen close to the site.  Lungs clear chest nontender.  No focal neurologic deficits.  X-ray was obtained in triage showing concern for catheter shifting slightly.  With this finding, interventional radiology was called.  They reviewed all the images and felt that it was likely a positional appearance of shifting in the context of the patient story instead of actual movement.  Even if it did shift according to them, it does not appear to be causing any current harm however they request the patient follow-up with him in clinic and see her oncologist.  They do not feel anything emergent need to be done today or any further imaging needs to be obtained.    Patient was informed of the interventional radiology team's recommendations that it did not appear to have shifted critically.  Patient reports that she has had some recent weight loss, I suspect this may have caused her catheter to be more prominent on exam than it was before and may have caused some mild shifting.  Based on the patient's lack of other symptoms and well appearance and the reassuring specialist recognitions, we feel she is safe for discharge home.  Patient discharged home in good condition with understanding of return precautions and instructions follow-up with IR if any new symptoms persist.   Final Clinical Impressions(s) / ED Diagnoses   Final diagnoses:  Encounter for care related to [facility]    ED Discharge Orders    None      Clinical Impression: 1. Encounter for care related to  Port-a-Cath     Disposition: Discharge  Condition: Good  I have discussed the results, Dx and Tx plan with the pt(& family if present). He/she/they expressed understanding and agree(s) with  the plan. Discharge instructions discussed at great length. Strict return precautions discussed and pt &/or family have verbalized understanding of the instructions. No further questions at time of discharge.    New Prescriptions   No medications on file    Follow Up: Interventional Radiology clinic [phone].  Please call and schedule an appointment if you would like to see the interventional radiology team in follow-up.    [facility] [place] [id number] [place] [phone]    Your Oncology team        Tegeler, Gwenyth Allegra, MD 07/15/18 [id number]

## 2018-07-14 NOTE — ED Triage Notes (Signed)
Pt states she can feel something on her neck that she is tracing to her port-A-Cath. These she states is new and started a day or 2 ago, she is concerned something isn't right. Requesting placement confirmation.

## 2018-07-14 NOTE — ED Notes (Signed)
Pt given update on delay.

## 2018-07-14 NOTE — Discharge Instructions (Signed)
Your x-ray today was reviewed by the original radiologist who felt that there was no emergent abnormality with your portacatheter.  In the setting of your recent weight loss, this may have caused the catheter to feel more pronounced or slightly shift.  Based on the recognitions of the specialist, we feel you are safe for discharge home.  Please follow-up with your oncologist as we discussed and if you are still having problems, please follow-up with the interventional radiology team as they recommended.  If any symptoms acutely change or worsen, please return to the nearest emergency department.

## 2018-07-16 ENCOUNTER — Telehealth: Payer: Self-pay

## 2018-07-16 NOTE — Telephone Encounter (Signed)
Called to follow up after she went to ER with port  Issue. She said she feels better today. She said, when she went to the ER they did a chest xray and questioned port position. She is scheduled for CT scan 10/3. Flush appt canceled for 10/3. Appt scheduled with [name] 10/4.

## 2018-07-18 ENCOUNTER — Inpatient Hospital Stay: Payer: Medicare HMO | Attending: Gynecologic Oncology

## 2018-07-18 ENCOUNTER — Ambulatory Visit (HOSPITAL_COMMUNITY)
Admission: RE | Admit: 2018-07-18 | Discharge: 2018-07-18 | Disposition: A | Discharge status: Home / Self Care | Payer: Medicare HMO | Source: Ambulatory Visit | Attending: Hematology and Oncology | Admitting: Hematology and Oncology

## 2018-07-18 ENCOUNTER — Inpatient Hospital Stay: Payer: Medicare HMO

## 2018-07-18 DIAGNOSIS — R911 Solitary pulmonary nodule: Secondary | ICD-10-CM | POA: Insufficient documentation

## 2018-07-18 DIAGNOSIS — G620 Drug-induced polyneuropathy: Secondary | ICD-10-CM | POA: Insufficient documentation

## 2018-07-18 DIAGNOSIS — I700 Atherosclerosis of aorta: Secondary | ICD-10-CM | POA: Insufficient documentation

## 2018-07-18 DIAGNOSIS — Z9049 Acquired absence of other specified parts of digestive tract: Secondary | ICD-10-CM | POA: Insufficient documentation

## 2018-07-18 DIAGNOSIS — I10 Essential (primary) hypertension: Secondary | ICD-10-CM | POA: Diagnosis not present

## 2018-07-18 DIAGNOSIS — I2510 Atherosclerotic heart disease of native coronary artery without angina pectoris: Secondary | ICD-10-CM | POA: Insufficient documentation

## 2018-07-18 DIAGNOSIS — J479 Bronchiectasis, uncomplicated: Secondary | ICD-10-CM | POA: Diagnosis not present

## 2018-07-18 DIAGNOSIS — C562 Malignant neoplasm of left ovary: Secondary | ICD-10-CM | POA: Insufficient documentation

## 2018-07-18 DIAGNOSIS — Z90710 Acquired absence of both cervix and uterus: Secondary | ICD-10-CM | POA: Insufficient documentation

## 2018-07-18 DIAGNOSIS — Z87440 Personal history of urinary (tract) infections: Secondary | ICD-10-CM | POA: Diagnosis not present

## 2018-07-18 DIAGNOSIS — Z7984 Long term (current) use of oral hypoglycemic drugs: Secondary | ICD-10-CM | POA: Diagnosis not present

## 2018-07-18 DIAGNOSIS — Z79899 Other long term (current) drug therapy: Secondary | ICD-10-CM | POA: Insufficient documentation

## 2018-07-18 DIAGNOSIS — K5909 Other constipation: Secondary | ICD-10-CM | POA: Diagnosis not present

## 2018-07-18 DIAGNOSIS — K449 Diaphragmatic hernia without obstruction or gangrene: Secondary | ICD-10-CM | POA: Diagnosis not present

## 2018-07-18 DIAGNOSIS — Z9221 Personal history of antineoplastic chemotherapy: Secondary | ICD-10-CM | POA: Diagnosis not present

## 2018-07-18 DIAGNOSIS — R188 Other ascites: Secondary | ICD-10-CM | POA: Insufficient documentation

## 2018-07-18 DIAGNOSIS — T451X5A Adverse effect of antineoplastic and immunosuppressive drugs, initial encounter: Secondary | ICD-10-CM | POA: Insufficient documentation

## 2018-07-18 DIAGNOSIS — Z90722 Acquired absence of ovaries, bilateral: Secondary | ICD-10-CM | POA: Diagnosis not present

## 2018-07-18 LAB — CBC WITH DIFFERENTIAL (CANCER CENTER ONLY)
BASOS PCT: 1 %
Basophils Absolute: 0 10*3/uL (ref 0.0–0.1)
EOS ABS: 0.1 10*3/uL (ref 0.0–0.5)
Eosinophils Relative: 3 %
HCT: 34.1 % — ABNORMAL LOW (ref 34.8–46.6)
HEMOGLOBIN: 11.7 g/dL (ref 11.6–15.9)
LYMPHS ABS: 1.5 10*3/uL (ref 0.9–3.3)
Lymphocytes Relative: 32 %
MCH: 34 pg (ref 25.1–34.0)
MCHC: 34.4 g/dL (ref 31.5–36.0)
MCV: 98.7 fL (ref 79.5–101.0)
Monocytes Absolute: 0.3 10*3/uL (ref 0.1–0.9)
Monocytes Relative: 6 %
NEUTROS PCT: 58 %
Neutro Abs: 2.7 10*3/uL (ref 1.5–6.5)
Platelet Count: 132 10*3/uL — ABNORMAL LOW (ref 145–400)
RBC: 3.45 MIL/uL — AB (ref 3.70–5.45)
RDW: 16.3 % — ABNORMAL HIGH (ref 11.2–14.5)
WBC: 4.6 10*3/uL (ref 3.9–10.3)

## 2018-07-18 LAB — CMP (CANCER CENTER ONLY)
ALT: 12 U/L (ref 0–44)
ANION GAP: 10 (ref 5–15)
AST: 13 U/L — ABNORMAL LOW (ref 15–41)
Albumin: 4.1 g/dL (ref 3.5–5.0)
Alkaline Phosphatase: 60 U/L (ref 38–126)
BUN: 16 mg/dL (ref 8–23)
CHLORIDE: 104 mmol/L (ref 98–111)
CO2: 27 mmol/L (ref 22–32)
CREATININE: 0.7 mg/dL (ref 0.44–1.00)
Calcium: 10 mg/dL (ref 8.9–10.3)
Glucose, Bld: 117 mg/dL — ABNORMAL HIGH (ref 70–99)
Potassium: 4 mmol/L (ref 3.5–5.1)
Sodium: 141 mmol/L (ref 135–145)
Total Bilirubin: 0.7 mg/dL (ref 0.3–1.2)
Total Protein: 6.8 g/dL (ref 6.5–8.1)

## 2018-07-18 MED ORDER — SODIUM CHLORIDE 0.9 % IJ SOLN
INTRAMUSCULAR | Status: AC
  Filled 2018-07-18: qty 50

## 2018-07-18 MED ORDER — IOHEXOL 300 MG/ML  SOLN
100.0000 mL | Freq: Once | INTRAMUSCULAR | Status: AC | PRN
  Administered 2018-07-18: 100 mL via INTRAVENOUS

## 2018-07-19 ENCOUNTER — Telehealth: Payer: Self-pay | Admitting: Pharmacist

## 2018-07-19 ENCOUNTER — Encounter: Payer: Self-pay | Admitting: Obstetrics

## 2018-07-19 ENCOUNTER — Encounter: Payer: Self-pay | Admitting: Oncology

## 2018-07-19 ENCOUNTER — Encounter: Payer: Self-pay | Admitting: Hematology and Oncology

## 2018-07-19 ENCOUNTER — Inpatient Hospital Stay (HOSPITAL_BASED_OUTPATIENT_CLINIC_OR_DEPARTMENT_OTHER): Payer: Medicare HMO | Admitting: Obstetrics

## 2018-07-19 ENCOUNTER — Inpatient Hospital Stay (HOSPITAL_BASED_OUTPATIENT_CLINIC_OR_DEPARTMENT_OTHER): Payer: Medicare HMO | Admitting: Hematology and Oncology

## 2018-07-19 VITALS — BP 150/73 | HR 85 | Temp 98.7°F | Resp 18 | Ht 64.5 in | Wt 169.4 lb

## 2018-07-19 VITALS — BP 134/71 | HR 75 | Temp 98.7°F | Resp 20 | Ht 65.0 in | Wt 169.4 lb

## 2018-07-19 DIAGNOSIS — K449 Diaphragmatic hernia without obstruction or gangrene: Secondary | ICD-10-CM

## 2018-07-19 DIAGNOSIS — C562 Malignant neoplasm of left ovary: Secondary | ICD-10-CM

## 2018-07-19 DIAGNOSIS — R188 Other ascites: Secondary | ICD-10-CM

## 2018-07-19 DIAGNOSIS — Z7984 Long term (current) use of oral hypoglycemic drugs: Secondary | ICD-10-CM

## 2018-07-19 DIAGNOSIS — Z90710 Acquired absence of both cervix and uterus: Secondary | ICD-10-CM

## 2018-07-19 DIAGNOSIS — Z9049 Acquired absence of other specified parts of digestive tract: Secondary | ICD-10-CM

## 2018-07-19 DIAGNOSIS — R911 Solitary pulmonary nodule: Secondary | ICD-10-CM | POA: Diagnosis not present

## 2018-07-19 DIAGNOSIS — J479 Bronchiectasis, uncomplicated: Secondary | ICD-10-CM

## 2018-07-19 DIAGNOSIS — Z90722 Acquired absence of ovaries, bilateral: Secondary | ICD-10-CM

## 2018-07-19 DIAGNOSIS — I700 Atherosclerosis of aorta: Secondary | ICD-10-CM

## 2018-07-19 DIAGNOSIS — Z9221 Personal history of antineoplastic chemotherapy: Secondary | ICD-10-CM

## 2018-07-19 DIAGNOSIS — L299 Pruritus, unspecified: Secondary | ICD-10-CM

## 2018-07-19 DIAGNOSIS — T451X5A Adverse effect of antineoplastic and immunosuppressive drugs, initial encounter: Secondary | ICD-10-CM | POA: Diagnosis not present

## 2018-07-19 DIAGNOSIS — G620 Drug-induced polyneuropathy: Secondary | ICD-10-CM

## 2018-07-19 DIAGNOSIS — K5909 Other constipation: Secondary | ICD-10-CM

## 2018-07-19 DIAGNOSIS — Z79899 Other long term (current) drug therapy: Secondary | ICD-10-CM

## 2018-07-19 LAB — CA 125: Cancer Antigen (CA) 125: 14.9 U/mL (ref 0.0–38.1)

## 2018-07-19 MED ORDER — DIPHENHYDRAMINE HCL 25 MG PO CAPS
ORAL_CAPSULE | ORAL | Status: AC
  Filled 2018-07-19: qty 1

## 2018-07-19 MED ORDER — DIPHENHYDRAMINE HCL 25 MG PO TABS
25.0000 mg | ORAL_TABLET | Freq: Once | ORAL | Status: AC
  Administered 2018-07-19: 25 mg via ORAL
  Filled 2018-07-19: qty 1

## 2018-07-19 NOTE — Progress Notes (Signed)
Out in lobby upset and crying. Complaining of itching all over. Per [name] Benadryl given.

## 2018-07-19 NOTE — Assessment & Plan Note (Signed)
Unfortunately, CT imaging showed new lung lesion, worrisome for possible metastatic disease We discussed the risk and benefits of surveillance imaging follow-up, PET CT scan and biopsy Ultimately, we agreed to proceed with PET CT scan first If PET CT scan is not normal, we will proceed with CT-guided lung biopsy.

## 2018-07-19 NOTE — Progress Notes (Signed)
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 NM PET Image Initial (PI) Skull Base To Thigh    Standing Status:   Future    Standing Expiration Date:   07/19/2019    Order Specific Question:   If indicated for the ordered procedure, I authorize the administration of a radiopharmaceutical per Radiology protocol    Answer:   Yes    Order Specific Question:   Preferred imaging location?    Answer:   [facility]    Order Specific Question:   Radiology Contrast Protocol - do NOT remove file path    Answer:   [email or web address]    INTERVAL HISTORY: Please see below for problem oriented charting. 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 - TUMOR INVOLVES OVARY SURFACE AND LEFT FALLOPIAN TUBE. - SEE ONCOLOGY TABLE. 2. Mesentery, small bowel mesentery biopsy #1 - BENIGN FIBROADIPOSE TISSUE. 3. Ovary and fallopian tube, right - BENIGN OVARY WITH INCLUSION CYSTS. - BENIGN FALLOPIAN TUBE WITH PARATUBAL CYSTS AND ADENOFIBROMA. 4. Omentum, resection for tumor - BENIGN ADIPOSE TISSUE. 5. Peritoneum, biopsy, left diaphragmatic - BENIGN PERITONEAL TYPE TISSUE. 6. Peritoneum, biopsy, right diaphragmatic - BENIGN PERITONEAL TYPE TISSUE. 7. Mesentery, small bowel mesenteric biopsy #2 - BENIGN FIBROADIPOSE TISSUE. 8. Lymph nodes, regional resection, right pelvic - FIVE OF FIVE LYMPH NODES NEGATIVE FOR CARCINOMA (0/5). 9. Lymph nodes, regional resection, left pelvic - FOUR OF FOUR LYMPH NODES NEGATIVE FOR CARCINOMA (0/4). 10. Peritoneum, biopsy, left gutter - BENIGN PERITONEAL TYPE TISSUE. 11. Peritoneum, biopsy, bladder - BENIGN PERITONEAL TYPE TISSUE WITH ACUTE INFLAMMATION AND CALCIFICATIONS.  12. Peritoneum, biopsy, cul-de-sac - BENIGN PERITONEAL TYPE TISSUE WITH ACUTE INFLAMMATION AND CALCIFICATIONS. 13. Soft tissue, biopsy, right gutter - BENIGN FIBROADIPOSE TISSUE. 14. Lymph node, biopsy, right para-aortic - TWO OF TWO LYMPH NODES NEGATIVE FOR CARCINOMA (0/2). 15. Lymph node, biopsy, left para-aortic - ONE OF ONE LYMPH NODES NEGATIVE FOR CARCINOMA (0/1). Microscopic Comment 1. OVARY Specimen(s):  Left ovary and fallopian tube. Procedure: (including lymph node sampling): Bilateral salpingo-oophorectomy with omental and peritoneal biopsies and  lymph node biopsies. Primary tumor site (including laterality): Left ovary. Ovarian surface involvement: Present. Ovarian capsule intact without fragmentation: Intact. Maximum tumor size (cm): 11 cm total. Histologic type: Serous cystadenocarcinoma. Grade: High grade (low grade areas also present). Peritoneal implants: (specify invasive or non-invasive): N/A. Pelvic extension (list additional structures on separate lines and if involved): Left fallopian tube. Lymph nodes: number examined 12 ; number positive 0 TNM code: pT2a, pN0, pMX FIGO Stage (based on pathologic findings, needs clinical correlation): IIA Comments: None.    01/31/2018 Pathology Results    PERITONEAL WASHING (SPECIMEN 1 OF 1 COLLECTED 01/31/18): MALIGNANT CELLS PRESENT CONSISTENT WITH METASTATIC CARCINOMA.    01/31/2018 Surgery    Procedure(s) Performed:  1. Robotic BSO and washings. 2. Exploratory laparotomy, Staging including infragastric omentectomy, Bilateral pelvic and paraaortic lymphadenectomy, pertioneal biopsies.  Specimens: Bilateral tubes / ovaries, bilateral pelvic and paraaortic lymph nodes, peritoneal biopsies, washings and omentum.  Operative Findings: The left adnexa was adherent to the left pelvic peritoneum suspected due to the inferior retraction from the vaginal hysterectomy. Intraoperative leakage of cystic fluid from left ovary.  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  The right adnexa grossly appeared normal although slightly enlarged for her age.  There were some indurated lymph nodes but no overtly malignant lymph nodes were suspected.  Small bowel mesentery had 3-4 superficial possible implants.  1 of these was sent for frozen section returned benign.  She did have adhesive disease from her open cholecystectomy in the right upper quadrant.  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 16 mm exophytic lesion upper pole right kidney has attenuation too high to be a simple cyst. This may be a cyst complicated by proteinaceous debris or hemorrhage, but renal cell carcinoma is a concern. Routine outpatient follow-up MRI of the abdomen without and with contrast recommended after resolution of patient's acute symptoms (so she is better able to participate with positioning and breath holding). 2. Mild edema/possible minimal hemorrhage in the extraperitoneal soft tissues of the pelvic for tracking up both pelvic sidewalls. Imaging appearance is not outside of the spectrum of findings expected 6 days after the reported surgery. Trace interloop mesenteric and free fluid is identified in the peritoneal cavity in there is some mild edema in the omentum. There is no organized or rim enhancing collection to suggest evolving abscess. 3. Gas in the extraperitoneal soft tissues of the left lower quadrant is associated with gas in the subcutaneous fat of the lower left anterior abdominal wall. This is not unexpected on postoperative day 6. No evidence for intraperitoneal free air. 4. No CT features to suggest small bowel obstruction. Oral contrast material has migrated about halfway through the small bowel loops but there is no differential distention of proximal versus distal small bowel. The colon is diffusely distended and fluid-filled proximally, but formed stool is noted in the distal colon. Given apparent slow migration of contrast  and fluid-filled small and large bowel loops, a component of ileus is possible.    02/14/2018 Imaging    MR abdomen Benign Bosniak  category 2 cyst in upper pole of right kidney, corresponding with lesion seen on previous CT. No evidence of renal neoplasm or other significant abnormality.     02/18/2018 Cancer Staging    Staging form: Ovary, Fallopian Tube, and Primary Peritoneal Carcinoma, AJCC 8th Edition - Pathologic: Stage II (pT2, pN0, cM0) - Signed by Heath Lark, MD on 02/18/2018    02/28/2018 Procedure    Status post right IJ port catheter placement. 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 For last cycle, taxol was omitted    07/18/2018 Imaging    1. No evidence for residual or recurrent tumor within the abdomen or pelvis. There has been interval resolution edema and fluid within the peritoneal cavity. 2. New pulmonary nodule is identified within the right lower lobe measuring 1.1 cm, image 87/4. Consider one of the following in 3 months for both low-risk and high-risk individuals: (a) repeat chest CT, (b) follow-up PET-CT, or (c) tissue sampling. This recommendation follows the consensus statement: Guidelines for Management of Incidental Pulmonary Nodules Detected on CT Images: From the Fleischner Society 2017; Radiology 2017; 284:228-243. 3. Stable 7 mm nodule within the right upper lobe. 4. Aortic Atherosclerosis (ICD10-I70.0). 5. Multi vessel coronary artery atherosclerotic calcifications.    07/18/2018 Tumor Marker    Patient's tumor was tested for the following markers: CA-125 Results of the tumor marker test revealed 14.9     REVIEW OF SYSTEMS:   Constitutional: Denies fevers, chills or abnormal weight loss Eyes: Denies blurriness of vision Ears, nose, mouth, throat, and face: Denies mucositis or sore throat Respiratory: Denies cough, dyspnea or  wheezes Cardiovascular: Denies palpitation, chest discomfort or lower extremity swelling Skin: Denies abnormal skin rashes Lymphatics: Denies new lymphadenopathy or easy bruising Behavioral/Psych: Mood is stable, no new changes  All other systems were reviewed with the patient and are negative.  I have reviewed the past medical history, past surgical history, social history and family history with the patient and they are unchanged from previous note.  ALLERGIES:  is allergic to penicillins.  MEDICATIONS:  Current Outpatient Medications  Medication Sig Dispense Refill  . atorvastatin (LIPITOR) 20 MG tablet Take 20 mg by mouth every evening.     . Cholecalciferol (VITAMIN D) 2000 units tablet Take 2,000 Units by mouth daily after breakfast.     . Cyanocobalamin (B-12 COMPLIANCE INJECTION) 1000 MCG/ML KIT Inject 1,000 mcg as directed every 30 (thirty) days.    Marland Kitchen docusate sodium (COLACE) 100 MG capsule Take 200 mg by mouth daily as needed (severe constipation).    Marland Kitchen exenatide (BYETTA) 10 MCG/0.04ML SOPN injection Inject 10 mcg into the skin 2 (two) times daily.     Marland Kitchen FLUoxetine (PROZAC) 20 MG capsule Take 20 mg by mouth daily after breakfast.    . hydrochlorothiazide (HYDRODIURIL) 25 MG tablet Take 25 mg by mouth daily.    Marland Kitchen HYDROcodone-acetaminophen (NORCO/VICODIN) 5-325 MG tablet Take 1 tablet by mouth every 6 (six) hours as needed for moderate pain. 60 tablet 0  . levothyroxine (SYNTHROID, LEVOTHROID) 100 MCG tablet Take 100 mcg by mouth  daily before breakfast.    . lidocaine-prilocaine (EMLA) cream Apply to affected area once 30 g 3  . losartan (COZAAR) 100 MG tablet Take 100 mg by mouth at bedtime.     . Melatonin 5 MG TABS Take 5-10 mg by mouth at bedtime as needed (sleep).     . metFORMIN (GLUCOPHAGE-XR) 500 MG 24 hr tablet Take 1,000 mg by mouth 2 (two) times daily.    . naproxen (NAPROSYN) 250 MG tablet Take 1 tablet (250 mg total) by mouth 2 (two) times daily with a meal. (Patient  taking differently: Take 250 mg by mouth 2 (two) times daily as needed for mild pain or moderate pain. ) 60 tablet 2  . omeprazole (PRILOSEC OTC) 20 MG tablet Take 20 mg by mouth daily.    . ondansetron (ZOFRAN) 4 MG tablet Take 1 tablet (4 mg total) by mouth every 8 (eight) hours as needed for nausea or vomiting. (Patient not taking: Reported on 07/14/2018) 20 tablet 0  . ondansetron (ZOFRAN) 8 MG tablet Take 1 tablet (8 mg total) by mouth 2 (two) times daily as needed for refractory nausea / vomiting. Start on day 3 after chemo. (Patient not taking: Reported on 07/19/2018) 30 tablet 1  . prochlorperazine (COMPAZINE) 10 MG tablet Take 1 tablet (10 mg total) by mouth every 6 (six) hours as needed (Nausea or vomiting). (Patient not taking: Reported on 07/19/2018) 30 tablet 1   No current facility-administered medications for this visit.     PHYSICAL EXAMINATION: ECOG PERFORMANCE STATUS: 1 - Symptomatic but completely ambulatory  Vitals:   07/19/18 1241  BP: (!) 126/58  Pulse: 73  Resp: 18  Temp: 98.7 F (37.1 C)  SpO2: 99%   Filed Weights   07/19/18 1241  Weight: 169 lb 6.4 oz (76.8 kg)    GENERAL:alert, no distress and comfortable SKIN: skin color, texture, turgor are normal, no rashes or significant lesions Musculoskeletal:no cyanosis of digits and no clubbing  NEURO: alert & oriented x 3 with fluent speech, no focal motor/sensory deficits  LABORATORY DATA:  I have reviewed the data as listed    Component Value Date/Time   NA 141 07/18/2018 0938   K 4.0 07/18/2018 0938   CL 104 07/18/2018 0938   CO2 27 07/18/2018 0938   GLUCOSE 117 (H) 07/18/2018 0938   BUN 16 07/18/2018 0938   CREATININE 0.70 07/18/2018 0938   CALCIUM 10.0 07/18/2018 0938   PROT 6.8 07/18/2018 0938   ALBUMIN 4.1 07/18/2018 0938   AST 13 (L) 07/18/2018 0938   ALT 12 07/18/2018 0938   ALKPHOS 60 07/18/2018 0938   BILITOT 0.7 07/18/2018 0938   GFRNONAA >60 07/18/2018 0938   GFRAA >60 07/18/2018 0938     No results found for: SPEP, UPEP  Lab Results  Component Value Date   WBC 4.6 07/18/2018   NEUTROABS 2.7 07/18/2018   HGB 11.7 07/18/2018   HCT 34.1 (L) 07/18/2018   MCV 98.7 07/18/2018   PLT 132 (L) 07/18/2018      Chemistry      Component Value Date/Time   NA 141 07/18/2018 0938   K 4.0 07/18/2018 0938   CL 104 07/18/2018 0938   CO2 27 07/18/2018 0938   BUN 16 07/18/2018 0938   CREATININE 0.70 07/18/2018 0938      Component Value Date/Time   CALCIUM 10.0 07/18/2018 0938   ALKPHOS 60 07/18/2018 0938   AST 13 (L) 07/18/2018 0938   ALT 12 07/18/2018 9038  BILITOT 0.7 07/18/2018 0254       RADIOGRAPHIC STUDIES: I have reviewed multiple imaging studies with the patient and daughter I have personally reviewed the radiological images as listed and agreed with the findings in the report. Dg Chest 2 View  Result Date: 07/14/2018 CLINICAL DATA:  Port-A-Cath placement EXAM: CHEST - 2 VIEW COMPARISON:  02/28/2018, radiograph 12/01/2014 FINDINGS: Right central venous port slightly changed in position since fluoroscopic image 02/28/2018. There is a sharper curve at the apex of the catheter and the tip of the catheter is now positioned over the brachiocephalic confluence. There is no focal opacity or pleural effusion. The cardiomediastinal silhouette is normal. Aortic atherosclerosis. No pneumothorax. IMPRESSION: No active cardiopulmonary disease. Right-sided central venous port show slight interval change in position since fluoroscopic image 02/28/2018 as described above. Tip of the catheter now projects over the brachiocephalic confluence. Electronically Signed   By: Donavan Foil M.D.   On: 07/14/2018 16:55   Ct Chest W Contrast  Result Date: 07/18/2018 CLINICAL DATA:  Follow-up epithelial ovarian/fallopian 2/primary peritoneal carcinoma EXAM: CT CHEST, ABDOMEN, AND PELVIS WITH CONTRAST TECHNIQUE: Multidetector CT imaging of the chest, abdomen and pelvis was performed following  the standard protocol during bolus administration of intravenous contrast. CONTRAST:  162m OMNIPAQUE IOHEXOL 300 MG/ML  SOLN COMPARISON:  02/05/2018 FINDINGS: CT CHEST FINDINGS Cardiovascular: Normal heart size. Aortic atherosclerosis. Aortic atherosclerosis. Calcification in the RCA, LAD coronary arteries noted. Mediastinum/Nodes: Normal appearance of the thyroid gland. The trachea appears patent and is midline. Small hiatal hernia. No supraclavicular or axillary adenopathy. No mediastinal or hilar adenopathy. Lungs/Pleura: No pleural effusions. 7 mm anterior right upper lobe lung nodule is identified, image 46/4. Unchanged. New nodule within the posteromedial right lower lobe measures 11 mm, image 87/4. Bronchiectasis noted within the right middle lobe. Likely postinflammatory. Scar identified within the posterolateral right lower lobe. Musculoskeletal: No aggressive lytic or sclerotic bone lesions. CT ABDOMEN PELVIS FINDINGS Hepatobiliary: No focal liver abnormality is seen. Status post cholecystectomy. No biliary dilatation. Pancreas: Unremarkable. No pancreatic ductal dilatation or surrounding inflammatory changes. Spleen: Normal in size without focal abnormality. Adrenals/Urinary Tract: Normal adrenal glands. Hyperdense lesion arising from upper pole of right kidney is again identified measuring 1.2 cm, image 56/2. Unchanged. No additional kidney abnormalities. No hydronephrosis. The urinary bladder is normal. Stomach/Bowel: Small hiatal hernia. The stomach is non-distended. No abnormal small bowel dilatation. No pathologic dilatation of the colon. Vascular/Lymphatic: Aortic atherosclerosis.  No aneurysm. No retroperitoneal or mesenteric adenopathy. Reproductive: Status post hysterectomy. No adnexal masses. Other: No ascites or focal fluid collections identified. No peritoneal nodularity within the abdomen or pelvis. Musculoskeletal: No acute or significant osseous findings. IMPRESSION: 1. No evidence for  residual or recurrent tumor within the abdomen or pelvis. There has been interval resolution edema and fluid within the peritoneal cavity. 2. New pulmonary nodule is identified within the right lower lobe measuring 1.1 cm, image 87/4. Consider one of the following in 3 months for both low-risk and high-risk individuals: (a) repeat chest CT, (b) follow-up PET-CT, or (c) tissue sampling. This recommendation follows the consensus statement: Guidelines for Management of Incidental Pulmonary Nodules Detected on CT Images: From the Fleischner Society 2017; Radiology 2017; 284:228-243. 3. Stable 7 mm nodule within the right upper lobe. 4.  Aortic Atherosclerosis (ICD10-I70.0). 5. Multi vessel coronary artery atherosclerotic calcifications. Electronically Signed   By: TKerby MoorsM.D.   On: 07/18/2018 15:13   Ct Abdomen Pelvis W Contrast  Result Date: 07/18/2018 CLINICAL DATA:  Follow-up epithelial  ovarian/fallopian 2/primary peritoneal carcinoma EXAM: CT CHEST, ABDOMEN, AND PELVIS WITH CONTRAST TECHNIQUE: Multidetector CT imaging of the chest, abdomen and pelvis was performed following the standard protocol during bolus administration of intravenous contrast. CONTRAST:  176m OMNIPAQUE IOHEXOL 300 MG/ML  SOLN COMPARISON:  02/05/2018 FINDINGS: CT CHEST FINDINGS Cardiovascular: Normal heart size. Aortic atherosclerosis. Aortic atherosclerosis. Calcification in the RCA, LAD coronary arteries noted. Mediastinum/Nodes: Normal appearance of the thyroid gland. The trachea appears patent and is midline. Small hiatal hernia. No supraclavicular or axillary adenopathy. No mediastinal or hilar adenopathy. Lungs/Pleura: No pleural effusions. 7 mm anterior right upper lobe lung nodule is identified, image 46/4. Unchanged. New nodule within the posteromedial right lower lobe measures 11 mm, image 87/4. Bronchiectasis noted within the right middle lobe. Likely postinflammatory. Scar identified within the posterolateral right lower  lobe. Musculoskeletal: No aggressive lytic or sclerotic bone lesions. CT ABDOMEN PELVIS FINDINGS Hepatobiliary: No focal liver abnormality is seen. Status post cholecystectomy. No biliary dilatation. Pancreas: Unremarkable. No pancreatic ductal dilatation or surrounding inflammatory changes. Spleen: Normal in size without focal abnormality. Adrenals/Urinary Tract: Normal adrenal glands. Hyperdense lesion arising from upper pole of right kidney is again identified measuring 1.2 cm, image 56/2. Unchanged. No additional kidney abnormalities. No hydronephrosis. The urinary bladder is normal. Stomach/Bowel: Small hiatal hernia. The stomach is non-distended. No abnormal small bowel dilatation. No pathologic dilatation of the colon. Vascular/Lymphatic: Aortic atherosclerosis.  No aneurysm. No retroperitoneal or mesenteric adenopathy. Reproductive: Status post hysterectomy. No adnexal masses. Other: No ascites or focal fluid collections identified. No peritoneal nodularity within the abdomen or pelvis. Musculoskeletal: No acute or significant osseous findings. IMPRESSION: 1. No evidence for residual or recurrent tumor within the abdomen or pelvis. There has been interval resolution edema and fluid within the peritoneal cavity. 2. New pulmonary nodule is identified within the right lower lobe measuring 1.1 cm, image 87/4. Consider one of the following in 3 months for both low-risk and high-risk individuals: (a) repeat chest CT, (b) follow-up PET-CT, or (c) tissue sampling. This recommendation follows the consensus statement: Guidelines for Management of Incidental Pulmonary Nodules Detected on CT Images: From the Fleischner Society 2017; Radiology 2017; 284:228-243. 3. Stable 7 mm nodule within the right upper lobe. 4.  Aortic Atherosclerosis (ICD10-I70.0). 5. Multi vessel coronary artery atherosclerotic calcifications. Electronically Signed   By: TKerby MoorsM.D.   On: 07/18/2018 15:13    All questions were answered.  The patient knows to call the clinic with any problems, questions or concerns. No barriers to learning was detected.  I spent 25 minutes counseling the patient face to face. The total time spent in the appointment was 30 minutes and more than 50% was on counseling and review of test results  NHeath Lark MD 07/19/2018 2:52 PM

## 2018-07-19 NOTE — Telephone Encounter (Addendum)
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  Original diagnosis in April 2019 Patient treated with carboplatin plus paclitaxel x 6 cycles 03/04/2018-06/18/2018, paclitaxel was held for last cycle  Last carboplatin infusion: 06/18/2018  Labs from 07/18/18 assessed, OK for treatment. 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  Patient on multiple antidiabetic agents.  Blood glucose will continue to be monitored throughout treatment with Rubraca.  No change to current therapy is indicated at this time.  Prescription has been e-scribed to the [facility] for benefits analysis and approval.  Oral Oncology Clinic will continue to follow for insurance authorization, copayment issues, initial counseling and start date.  Johny Drilling, PharmD, BCPS, BCOP  07/19/2018 3:29 PM Oral Oncology Clinic [phone]

## 2018-07-19 NOTE — Progress Notes (Signed)
[place] at [facility]   Progress Note : Established Patient FOLLOW-UP  Consult was initally requested by Dr. Matthew Saras   CC:  Chief Complaint  Patient presents with  . Malignant neoplasm of left ovary [facility])    HPI: [name] is a 77 y.o.    Interval History Since her last visit she has completed her 6 cycles of chemotherapy. She was dose reduced on her Taxol from 129m/m2 cycle 1 to 1427mm2 C2-5, then dropped Taxol for Cycle 6 due to intolerance. Chemo start was 03/04/18 and end was 06/18/18.  End of treatment CT Scan was ordered by Dr. GoAlvy Bimlernd there is a new pulmonary lesion in the RLL. Plan at this point is PET +/- biopsy.  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 She otherwise feels well. She is anxious about the lung lesion.   Presenting History She was referred to [place] an 8 cm complex pelvic mass and normal CA 125 of 11 on 01/09/18, no family history of a second-degree relative with breast cancer in first-degree relative with bladder cancer.  She has a history of hysterectomy in 1984.  Recommendations were for bilateral salpingo-oophorectomy with staging and debulking based on operative findings.  On her first visit with usKoreaDr. BrSkeet Latcheviewed, the benefits of a minimally invasive approach which include short hospitalization with her return to function.  She is aware that if there is intraoperative adhesive disease or other intraoperative processes that may this procedure safe for by a laparotomy that would be performed.  She was counseled for robotic assisted bilateral salpingo-oophorectomy possible omentectomy lymph node dissection biopsies debulking and other indicated procedures including laparotomy.  Risks of the procedures discussed were infection bleeding damage to surrounding structures prolonged hospitalization and reoperation and DVT.   The surgery was placed on my schedule  to help expedite the procedure. She was symptomatic with increased urinary frequency.  I did take her to surgery. Unfortunately the frozen section revealed malignancy and so a laparotomy, TAH/BSO, staging was ultimately performed 01/31/2018. She did have a readmission for ileus which resolved. Otherwise recovery was fairly unremarkable.  Measurement of disease Imaging (CA125 not elevated preop but being followed) Recent Labs    03/01/18 0939 07/18/18 0938  CAN125 13.6 14.9    Radiology  Preop  End of treatment Dg Chest 2 View  Result Date: 07/14/2018 CLINICAL DATA:  Port-A-Cath placement EXAM: CHEST - 2 VIEW COMPARISON:  02/28/2018, radiograph 12/01/2014 FINDINGS: Right central venous port slightly changed in position since fluoroscopic image 02/28/2018. There is a sharper curve at the apex of the catheter and the tip of the catheter is now positioned over the brachiocephalic confluence. There is no focal opacity or pleural effusion. The cardiomediastinal silhouette is normal. Aortic atherosclerosis. No pneumothorax. IMPRESSION: No active cardiopulmonary disease. Right-sided central venous port show slight interval change in position since fluoroscopic image 02/28/2018 as described above. Tip of the catheter now projects over the brachiocephalic confluence. Electronically Signed   By: KiDonavan Foil.D.   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 CONTRAST:  10028mMNIPAQUE IOHEXOL 300 MG/ML  SOLN COMPARISON:  02/05/2018 FINDINGS: CT CHEST FINDINGS Cardiovascular:  Normal heart size. Aortic atherosclerosis. Aortic atherosclerosis. Calcification in the RCA, LAD coronary  arteries noted. Mediastinum/Nodes: Normal appearance of the thyroid gland. The trachea appears patent and is midline. Small hiatal hernia. No supraclavicular or axillary adenopathy. No mediastinal or hilar adenopathy. Lungs/Pleura: No pleural effusions. 7 mm anterior right upper lobe lung nodule is identified, image 46/4. Unchanged. New nodule within the posteromedial right lower lobe measures 11 mm, image 87/4. Bronchiectasis noted within the right middle lobe. Likely postinflammatory. Scar identified within the posterolateral right lower lobe. Musculoskeletal: No aggressive lytic or sclerotic bone lesions. CT ABDOMEN PELVIS FINDINGS Hepatobiliary: No focal liver abnormality is seen. Status post cholecystectomy. No biliary dilatation. Pancreas: Unremarkable. No pancreatic ductal dilatation or surrounding inflammatory changes. Spleen: Normal in size without focal abnormality. Adrenals/Urinary Tract: Normal adrenal glands. Hyperdense lesion arising from upper pole of right kidney is again identified measuring 1.2 cm, image 56/2. Unchanged. No additional kidney abnormalities. No hydronephrosis. The urinary bladder is normal. Stomach/Bowel: Small hiatal hernia. The stomach is non-distended. No abnormal small bowel dilatation. No pathologic dilatation of the colon. Vascular/Lymphatic: Aortic atherosclerosis.  No aneurysm. No retroperitoneal or mesenteric adenopathy. Reproductive: Status post hysterectomy. No adnexal masses. Other: No ascites or focal fluid collections identified. No peritoneal nodularity within the abdomen or pelvis. Musculoskeletal: No acute or significant osseous findings. IMPRESSION: 1. No evidence for residual or recurrent tumor within the abdomen or pelvis. There has been interval resolution edema and fluid within the peritoneal cavity. 2. New pulmonary nodule is identified within the right lower lobe measuring 1.1 cm, image 87/4. Consider one of the following in 3 months for both low-risk and  high-risk individuals: (a) repeat chest CT, (b) follow-up PET-CT, or (c) tissue sampling. This recommendation follows the consensus statement: Guidelines for Management of Incidental Pulmonary Nodules Detected on CT Images: From the Fleischner Society 2017; Radiology 2017; 284:228-243. 3. Stable 7 mm nodule within the right upper lobe. 4.  Aortic Atherosclerosis (ICD10-I70.0). 5. Multi vessel coronary artery atherosclerotic calcifications. Electronically Signed   By: Kerby Moors M.D.   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 CONTRAST:  162m OMNIPAQUE IOHEXOL 300 MG/ML  SOLN COMPARISON:  02/05/2018 FINDINGS: CT CHEST FINDINGS Cardiovascular: Normal heart size. Aortic atherosclerosis. Aortic atherosclerosis. Calcification in the RCA, LAD coronary arteries noted. Mediastinum/Nodes: Normal appearance of the thyroid gland. The trachea appears patent and is midline. Small hiatal hernia. No supraclavicular or axillary adenopathy. No mediastinal or hilar adenopathy. Lungs/Pleura: No pleural effusions. 7 mm anterior right upper lobe lung nodule is identified, image 46/4. Unchanged. New nodule within the posteromedial right lower lobe measures 11 mm, image 87/4. Bronchiectasis noted within the right middle lobe. Likely postinflammatory. Scar identified within the posterolateral right lower lobe. Musculoskeletal: No aggressive lytic or sclerotic bone lesions. CT ABDOMEN PELVIS FINDINGS Hepatobiliary: No focal liver abnormality is seen. Status post cholecystectomy. No biliary dilatation. Pancreas: Unremarkable. No pancreatic ductal dilatation or surrounding inflammatory changes.  Spleen: Normal in size without focal abnormality. Adrenals/Urinary Tract: Normal adrenal glands. Hyperdense lesion arising from upper pole of right kidney is again identified measuring 1.2 cm, image 56/2. Unchanged. No additional kidney abnormalities. No hydronephrosis. The urinary bladder is normal. Stomach/Bowel: Small hiatal hernia. The stomach is non-distended. No abnormal small bowel  dilatation. No pathologic dilatation of the colon. Vascular/Lymphatic: Aortic atherosclerosis.  No aneurysm. No retroperitoneal or mesenteric adenopathy. Reproductive: Status post hysterectomy. No adnexal masses. Other: No ascites or focal fluid collections identified. No peritoneal nodularity within the abdomen or pelvis. Musculoskeletal: No acute or significant osseous findings. IMPRESSION: 1. No evidence for residual or recurrent tumor within the abdomen or pelvis. There has been interval resolution edema and fluid within the peritoneal cavity. 2. New pulmonary nodule is identified within the right lower lobe measuring 1.1 cm, image 87/4. Consider one of the following in 3 months for both low-risk and high-risk individuals: (a) repeat chest CT, (b) follow-up PET-CT, or (c) tissue sampling. This recommendation follows the consensus statement: Guidelines for Management of Incidental Pulmonary Nodules Detected on CT Images: From the Fleischner Society 2017; Radiology 2017; 284:228-243. 3. Stable 7 mm nodule within the right upper lobe. 4.  Aortic Atherosclerosis (ICD10-I70.0). 5. Multi vessel coronary artery atherosclerotic calcifications. Electronically Signed   By: Kerby Moors M.D.   On: 07/18/2018 15:13    Current Meds:  Outpatient Encounter Medications as of 07/19/2018  Medication Sig  . atorvastatin (LIPITOR) 20 MG tablet Take 20 mg by mouth every evening.   . Cholecalciferol (VITAMIN D) 2000 units tablet Take 2,000 Units by mouth daily after breakfast.   . Cyanocobalamin (B-12 COMPLIANCE INJECTION) 1000 MCG/ML KIT  Inject 1,000 mcg as directed every 30 (thirty) days.  Marland Kitchen docusate sodium (COLACE) 100 MG capsule Take 200 mg by mouth daily as needed (severe constipation).  Marland Kitchen exenatide (BYETTA) 10 MCG/0.04ML SOPN injection Inject 10 mcg into the skin 2 (two) times daily.   Marland Kitchen FLUoxetine (PROZAC) 20 MG capsule Take 20 mg by mouth daily after breakfast.  . hydrochlorothiazide (HYDRODIURIL) 25 MG tablet Take 25 mg by mouth daily.  Marland Kitchen HYDROcodone-acetaminophen (NORCO/VICODIN) 5-325 MG tablet Take 1 tablet by mouth every 6 (six) hours as needed for moderate pain.  Marland Kitchen levothyroxine (SYNTHROID, LEVOTHROID) 100 MCG tablet Take 100 mcg by mouth daily before breakfast.  . lidocaine-prilocaine (EMLA) cream Apply to affected area once  . losartan (COZAAR) 100 MG tablet Take 100 mg by mouth at bedtime.   . Melatonin 5 MG TABS Take 5-10 mg by mouth at bedtime as needed (sleep).   . metFORMIN (GLUCOPHAGE-XR) 500 MG 24 hr tablet Take 1,000 mg by mouth 2 (two) times daily.  . naproxen (NAPROSYN) 250 MG tablet Take 1 tablet (250 mg total) by mouth 2 (two) times daily with a meal. (Patient taking differently: Take 250 mg by mouth 2 (two) times daily as needed for mild pain or moderate pain. )  . omeprazole (PRILOSEC OTC) 20 MG tablet Take 20 mg by mouth daily.  . ondansetron (ZOFRAN) 4 MG tablet Take 1 tablet (4 mg total) by mouth every 8 (eight) hours as needed for nausea or vomiting. (Patient not taking: Reported on 07/14/2018)  . ondansetron (ZOFRAN) 8 MG tablet Take 1 tablet (8 mg total) by mouth 2 (two) times daily as needed for refractory nausea / vomiting. Start on day 3 after chemo. (Patient not taking: Reported on 07/19/2018)  . prochlorperazine (COMPAZINE) 10 MG tablet Take 1 tablet (10 mg total) by mouth every 6 (six) hours as needed (Nausea or vomiting). (Patient not taking: Reported on 07/19/2018)  . [DISCONTINUED] dexamethasone (DECADRON) 4 MG tablet Take 3 tabs the night before and 3 tabs the morning of chemotherapy, every 3  weeks, with food (Patient not taking: Reported on 07/14/2018)  . [DISCONTINUED] feeding supplement, GLUCERNA SHAKE, (  GLUCERNA SHAKE) LIQD Take 237 mLs by mouth daily.  . [DISCONTINUED] FLUoxetine (PROZAC) 20 MG capsule Take 20 mg by mouth every morning.   No facility-administered encounter medications on file as of 07/19/2018.     Allergy:  Allergies  Allergen Reactions  . Penicillins Anaphylaxis    Has patient had a PCN reaction causing immediate rash, facial/tongue/throat swelling, SOB or lightheadedness with hypotension: Yes Has patient had a PCN reaction causing severe rash involving mucus membranes or skin necrosis: No Has patient had a PCN reaction that required hospitalization: Yes Has patient had a PCN reaction occurring within the last 10 years: No If all of the above answers are "NO", then may proceed with Cephalosporin use.     Social Hx:   Social History   Socioeconomic History  . Marital status: Widowed    Spouse name: Not on file  . Number of children: 2  . Years of education: Not on file  . Highest education level: Not on file  Occupational History  . Occupation: retired Glass blower/designer  Social Needs  . Financial resource strain: Not on file  . Food insecurity:    Worry: Not on file    Inability: Not on file  . Transportation needs:    Medical: Not on file    Non-medical: Not on file  Tobacco Use  . Smoking status: Never Smoker  . Smokeless tobacco: Never Used  Substance and Sexual Activity  . Alcohol use: No  . Drug use: No  . Sexual activity: Not on file  Lifestyle  . Physical activity:    Days per week: Not on file    Minutes per session: Not on file  . Stress: Not on file  Relationships  . Social connections:    Talks on phone: Not on file    Gets together: Not on file    Attends religious service: Not on file    Active member of club or organization: Not on file    Attends meetings of clubs or organizations: Not on file    Relationship status:  Not on file  . Intimate partner violence:    Fear of current or ex partner: Not on file    Emotionally abused: Not on file    Physically abused: Not on file    Forced sexual activity: Not on file  Other Topics Concern  . Not on file  Social History Narrative  . Not on file    Past Surgical Hx:  Past Surgical History:  Procedure Laterality Date  . BLADDER SUSPENSION  2002  . BREAST SURGERY     several benign cysts removed; surgeries from [place]   . CHOLECYSTECTOMY  1988  . EYE SURGERY  2010 amd 2012   cataracts removed bilateral   . IR FLUORO GUIDE PORT INSERTION RIGHT  02/28/2018  . IR US GUIDE VASC ACCESS RIGHT  02/28/2018  . KNEE ARTHROSCOPY Left    l knee x2  . PARATHYROIDECTOMY  2010  . ROBOTIC ASSISTED SALPINGO OOPHERECTOMY Bilateral 01/31/2018   Procedure: XI ROBOTIC ASSISTED BILATERAL SALPINGO OOPHORECTOMY, STAGING, LAPAROTOMY, PELVIC AND PARA AORTIC LYMPH NODE DISSECTION, OMENTECTOMY;  Surgeon: Isabel Caprice, MD;  Location: [facility];  Service: Gynecology;  Laterality: Bilateral;  . ROTATOR CUFF REPAIR  2005   right   . TOTAL KNEE ARTHROPLASTY Left 12/08/2014   Procedure: LEFT TOTAL KNEE ARTHROPLASTY;  Surgeon: Tobi Bastos, MD;  Location: [facility];  Service: Orthopedics;  Laterality: Left;  Marland Kitchen VAGINAL HYSTERECTOMY  1    Past Medical Hx:  Past Medical History:  Diagnosis Date  . Arthritis   . Diabetes mellitus without complication ([place])    type 2   . Family history of bladder cancer   . Family history of breast cancer   . Family history of esophageal cancer   . Family history of lung cancer   . Family history of prostate cancer   . GERD (gastroesophageal reflux disease)   . Hypertension   . Hypothyroidism   . Ovarian cancer ([place]) 01/31/2018  . Pneumonia    its been 4 years   . PONV (postoperative nausea and vomiting)   . Vitamin B 12 deficiency     Past Gynecological History: 2 prior to hysterectomy in 1984 received estrogen therapy from 1984 until  2000 No LMP recorded. Patient has had a hysterectomy.  Family Hx:  Family History  Problem Relation Age of Onset  . Lymphoma Mother 77  . Bladder Cancer Sister 61  . Prostate cancer Brother 19       has surgery right away after dx  . Leukemia Brother        44's  . Breast cancer Other 70       niece, GT 2017 reportedly neg  . Prostate cancer Father 32       found on autopsy at death (62)  . Lung cancer Maternal Aunt   . Esophageal cancer Maternal Uncle   . Other Maternal Grandfather 75       cerebral hemmhorage  . Lung cancer Cousin   . Lung cancer Cousin   . Leukemia Cousin     Review of Systems  Constitutional: Positive for fatigue.  Neurological: Positive for numbness.  Psychiatric/Behavioral: The patient is nervous/anxious.   All other systems reviewed and are negative.  Vitals:  Blood pressure 134/71, pulse 75, temperature 98.7 F (37.1 C), temperature source Oral, resp. rate 20, height 5' 5"  (1.651 m), weight 169 lb 6.4 oz (76.8 kg), SpO2 100 %.  Physical Exam: ECOG PERFORMANCE STATUS: 1 - Symptomatic but completely ambulatory General :  Well developed, 77 y.o., female in no apparent distress HEENT:  Normocephalic/atraumatic, symmetric, EOMI, eyelids normal Neck:   No visible masses.  Respiratory:  Respirations unlabored, no use of accessory muscles CV:   Deferred Breast:  Deferred Musculoskeletal: Normal muscle strength. Abdomen:  Wound is vertical and healed, abdomen is soft, NT Extremities:  No visible edema, nontender Skin:   Normal inspection I do note her port placement on the right clavicular area which appears to be healing Neuro/Psych:  No focal motor deficit, no abnormal mental status. Normal gait. Normal affect. Alert and oriented to person, place, and time  Pelvic: Vulva: Normal external female genitalia.  Bladder/urethra: Urethral meatus normal in size and location. No lesions or   masses, well supported bladder Speculum exam: Vagina: No lesion, no  discharge, no bleeding.  Vaginal cuff is healed with no evidence of wound separation Cervix: Surgically absent Bimanual exam: Intact vaginal cuff Uterus: Surgically absent  Adnexa: No masses. Rectovaginal:  Deferred    ASSESSMENT Ovarian cancer  PLAN 1. Pulmonary lesion  the 4m pulmonary nodule is stable  Now a new RLL 1+cm lesion.  I agree with PET +/- biopsy per Dr. GAlvy Bimler If progressive disease would be considered platinum-refractory and options as follows:  Given her difficulty with systemic chemo already, her continued fatigue and neuropathy, and her HRD status not unreasonable to offer oral PARP if insurance will cover  or if able to obtain at reasonable cost  Otherwise could consider oral cytoxan +/- avastin  Doxil  Lastly consider hormonal oral options depending on ER/PR status 2. Continue to follow with medical oncology for chemotherapy  3. RTC 3 months for pelvic as CA125 unreliable and imaging sometimes difficult in picking up pelvic disease.  Face to face time with patient was 25 minutes. Over 50% of this time was spent on counseling and coordination of care.   Isabel Caprice, MD 07/19/2018, 2:07 PM   Cc (not today): Molli Posey, MD Referring OB/Gyn Gilford Rile, MD Harmon Dun PCP

## 2018-07-19 NOTE — Addendum Note (Signed)
Addended by: Flo Shanks on: 07/19/2018 04:06 PM   Modules accepted: Orders

## 2018-07-19 NOTE — Assessment & Plan Note (Signed)
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

## 2018-07-19 NOTE — Patient Instructions (Addendum)
1. Keep plan for PET and possible biopsy with [name]. 2. Return to see Dr. Gerarda Fraction in 3 months. 3. Please call our office in December to schedule an appointment for January 2020.

## 2018-07-19 NOTE — Assessment & Plan Note (Signed)
She has persistent chronic constipation but much improved since discontinuation of chemotherapy She will continue laxative therapy

## 2018-07-22 ENCOUNTER — Telehealth: Payer: Self-pay | Admitting: Oncology

## 2018-07-22 ENCOUNTER — Telehealth: Payer: Self-pay | Admitting: Pharmacist

## 2018-07-22 ENCOUNTER — Telehealth: Payer: Self-pay | Admitting: Hematology and Oncology

## 2018-07-22 MED ORDER — RUCAPARIB CAMSYLATE 300 MG PO TABS: 600.0000 mg | ORAL_TABLET | Freq: Two times a day (BID) | ORAL | 0 refills | Status: DC

## 2018-07-22 NOTE — Telephone Encounter (Signed)
Called regarding 10/10 patient is trying to get her appointment back for wed she is waiting for insurance.

## 2018-07-22 NOTE — Telephone Encounter (Signed)
Oral Oncology Pharmacist Encounter  Prior insurance authorization for [name] has been submitted to [facility] through cover my meds  Key: [id number] Status is pending  [name], PharmD, BCPS, BCOP  07/22/2018 9:47 AM Oral Oncology Clinic [phone]

## 2018-07-22 NOTE — Telephone Encounter (Signed)
[name] called and asked if the PET scan has been approved.  Advised her that it is still pending.  Discussed that she should plan on Wednesday to have the scan.  She verbalized understanding and agreement.

## 2018-07-23 ENCOUNTER — Telehealth: Payer: Self-pay | Admitting: Oncology

## 2018-07-23 NOTE — Telephone Encounter (Signed)
Called [name] regarding her PET scan.  She said Radiology called her yesterday and moved the appointment to Thursday at 7 am because it has not been authorized yet.  She said her insurance should be reinstated this morning at 8 am and was told the scan can be moved back to Wednesday if it is approved by 12 today.  Advised her that we are watching it closely and will let her know as soon as it is approved.  She verbalized understanding and agreement.

## 2018-07-23 NOTE — Telephone Encounter (Signed)
Oral Oncology Pharmacist Encounter  I called and updated patient about insurance denial of Rubraca and informed her on appeal process.  We should know before the end of the week the determination on the [facility] appeal.  If appeal is overturned, and [employer] agrees to pay their portion of the [place], oral oncology clinic will work with patient for medication acquisition from a pharmacy.  If [employer] does not overturn denial, and denies the appeal, oral oncology clinic will work with patient to obtain the medication at no out-of-pocket cost to her directly from the drug manufacturer.  Patient informed that we will keep her updated during each step of the process. I will perform initial counseling on Rubraca once we have details of medication acquisition worked out.  Patient expressed understanding and appreciation. Patient knows to call the office with any additional questions or concerns.  Johny Drilling, PharmD, BCPS, BCOP  07/23/2018 2:22 PM Oral Oncology Clinic [phone]

## 2018-07-23 NOTE — Telephone Encounter (Addendum)
[name] and discussed moving the PET scan to next week per [name]   She said she would like to keep the appointment for Thursday for now.  She said she called Medicare and was told [employer] sent them a letter saying that she requested to change her coverage on 07/13/18.  They did not terminate her coverage with [employer].  She is on the way to her insurance agent's office now with her daughter.    Asked how she is doing and she said she has been very upset and anxious.  Advised her again that the appointment can be moved to give more time for her insurance to be reinstated.  She verbalized agreement but would like to keep the appointment on Thursday for now.

## 2018-07-23 NOTE — Telephone Encounter (Signed)
[name] called and said she has been reinstated with [facility] but they need to contact Medicare before they can approve anything.  She is not sure how long that will take.

## 2018-07-23 NOTE — Telephone Encounter (Signed)
Oral Oncology Pharmacist Encounter  Received notification from [facility] that prior authorization for Rubraca has been denied due to not meeting the plans outline for medical necessity.  Letter of medical necessity and supporting clinical documentation has been provided to [facility]. Appeal marked as urgent.  This encounter will continue to be updated until final determination.  Johny Drilling, PharmD, BCPS, BCOP  07/23/2018 2:02 PM Oral Oncology Clinic [phone]

## 2018-07-24 ENCOUNTER — Telehealth: Payer: Self-pay | Admitting: Oncology

## 2018-07-24 ENCOUNTER — Ambulatory Visit (HOSPITAL_COMMUNITY): Payer: Medicare HMO

## 2018-07-24 NOTE — Telephone Encounter (Signed)
Yurika called and asked if we could check if [facility] shows as active for her.  Advised her that I will talk to Darlena in [place] and have [place] call her back.  She verbalized agreement.

## 2018-07-24 NOTE — Telephone Encounter (Signed)
Oral Oncology Pharmacist Encounter  Called [employer] to follow-up on appeal sent for prior authorization denial of Rubraca. Per representative, they did not receive the appeal submitted on cover my meds. Appeal packet faxed to [facility] at [phone], appeal marked urgent.  This encounter will continue to be updated until final determination.  Johny Drilling, PharmD, BCPS, BCOP  07/24/2018 2:30 PM Oral Oncology Clinic [phone]

## 2018-07-25 ENCOUNTER — Inpatient Hospital Stay (HOSPITAL_BASED_OUTPATIENT_CLINIC_OR_DEPARTMENT_OTHER): Payer: Medicare HMO | Admitting: Hematology and Oncology

## 2018-07-25 ENCOUNTER — Telehealth: Payer: Self-pay | Admitting: Pharmacist

## 2018-07-25 ENCOUNTER — Encounter: Payer: Self-pay | Admitting: Hematology and Oncology

## 2018-07-25 ENCOUNTER — Ambulatory Visit: Payer: Medicare HMO | Admitting: Hematology and Oncology

## 2018-07-25 ENCOUNTER — Telehealth: Payer: Self-pay

## 2018-07-25 ENCOUNTER — Ambulatory Visit (HOSPITAL_COMMUNITY)
Admission: RE | Admit: 2018-07-25 | Discharge: 2018-07-25 | Disposition: A | Discharge status: Home / Self Care | Payer: Medicare HMO | Source: Ambulatory Visit | Attending: Hematology and Oncology | Admitting: Hematology and Oncology

## 2018-07-25 DIAGNOSIS — R918 Other nonspecific abnormal finding of lung field: Secondary | ICD-10-CM

## 2018-07-25 DIAGNOSIS — Z90710 Acquired absence of both cervix and uterus: Secondary | ICD-10-CM

## 2018-07-25 DIAGNOSIS — G620 Drug-induced polyneuropathy: Secondary | ICD-10-CM | POA: Diagnosis not present

## 2018-07-25 DIAGNOSIS — C562 Malignant neoplasm of left ovary: Secondary | ICD-10-CM | POA: Diagnosis present

## 2018-07-25 DIAGNOSIS — J984 Other disorders of lung: Secondary | ICD-10-CM | POA: Diagnosis not present

## 2018-07-25 DIAGNOSIS — Z7984 Long term (current) use of oral hypoglycemic drugs: Secondary | ICD-10-CM

## 2018-07-25 DIAGNOSIS — R5381 Other malaise: Secondary | ICD-10-CM | POA: Insufficient documentation

## 2018-07-25 DIAGNOSIS — R188 Other ascites: Secondary | ICD-10-CM

## 2018-07-25 DIAGNOSIS — R911 Solitary pulmonary nodule: Secondary | ICD-10-CM

## 2018-07-25 DIAGNOSIS — I700 Atherosclerosis of aorta: Secondary | ICD-10-CM

## 2018-07-25 DIAGNOSIS — I10 Essential (primary) hypertension: Secondary | ICD-10-CM

## 2018-07-25 DIAGNOSIS — Z90722 Acquired absence of ovaries, bilateral: Secondary | ICD-10-CM

## 2018-07-25 DIAGNOSIS — Z9221 Personal history of antineoplastic chemotherapy: Secondary | ICD-10-CM

## 2018-07-25 DIAGNOSIS — T451X5A Adverse effect of antineoplastic and immunosuppressive drugs, initial encounter: Secondary | ICD-10-CM | POA: Diagnosis not present

## 2018-07-25 DIAGNOSIS — K5909 Other constipation: Secondary | ICD-10-CM

## 2018-07-25 DIAGNOSIS — Z79899 Other long term (current) drug therapy: Secondary | ICD-10-CM

## 2018-07-25 DIAGNOSIS — Z9049 Acquired absence of other specified parts of digestive tract: Secondary | ICD-10-CM

## 2018-07-25 DIAGNOSIS — J479 Bronchiectasis, uncomplicated: Secondary | ICD-10-CM

## 2018-07-25 DIAGNOSIS — K449 Diaphragmatic hernia without obstruction or gangrene: Secondary | ICD-10-CM

## 2018-07-25 HISTORY — DX: Other nonspecific abnormal finding of lung field: R91.8

## 2018-07-25 HISTORY — DX: Other malaise: R53.81

## 2018-07-25 LAB — GLUCOSE, CAPILLARY: Glucose-Capillary: 123 mg/dL — ABNORMAL HIGH (ref 70–99)

## 2018-07-25 MED ORDER — FLUDEOXYGLUCOSE F - 18 (FDG) INJECTION
8.3000 | Freq: Once | INTRAVENOUS | Status: AC | PRN
  Administered 2018-07-25: 8.3 via INTRAVENOUS

## 2018-07-25 NOTE — Assessment & Plan Note (Signed)
Her blood pressure is mildly low and she felt dizzy She is checking her blood pressure regularly at home and she is aware to stop amlodipine if she felt weak again

## 2018-07-25 NOTE — Telephone Encounter (Addendum)
Oral Oncology Patient Advocate Encounter  Rubraca copay is [phone]. I was able to secure a [place] for $5000 to cover the copay of Rubraca. The grant information is as follows and has been shared with [place]. Approval date 07/25/18-07/26/19  ID: [id number] BIN: [id number] PCN: [id number] Group: [id number]  I called the patient and shared this information with her. She verbalized understanding and great appreciation.  [place] Patient [place] Phone [phone] Fax [phone]

## 2018-07-25 NOTE — Telephone Encounter (Signed)
Called referral to [facility] for PT. Faxed referral to [phone] at [facility].

## 2018-07-25 NOTE — Assessment & Plan Note (Signed)
She is not symptomatic PET CT scan showed improvement Per radiologist recommendation, we will repeat CT scan in 4 months, due around February 2020

## 2018-07-25 NOTE — Assessment & Plan Note (Signed)
I have reviewed PET CT scan with the patient and her daughter The area of concern in the lung is smaller It is not hypermetabolic The radiologist felt this does not represent malignancy. 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

## 2018-07-25 NOTE — Assessment & Plan Note (Signed)
This is stable.  It is affecting her gait I recommend physical therapy and rehab and she agreed

## 2018-07-25 NOTE — Telephone Encounter (Signed)
Oral Chemotherapy Pharmacist Encounter  The Rubraca (rucaparib) appeal was approved.   Patient Education I spoke with patient for overview of new oral chemotherapy medication: Rubraca (rucaparib) for the maintenance treatment of ovarian cancer, planned duration until disease progression or unacceptable drug toxicity.   Counseled patient on administration, dosing, side effects, monitoring, drug-food interactions, safe handling, storage, and disposal. Patient will take 2 tablets (600 mg total) by mouth 2 (two) times daily.  Side effects include but not limited to: N/V, decreased wbc/hgb/plt, fatigue, diarrhea, constipation, changes in liver or kidney function.    Patient reports that she has ondansetron and prochlorperazine on hand to use for N/V.  Reviewed with patient importance of keeping a medication schedule and plan for any missed doses.  Ms. Breese voiced understanding and appreciation. All questions answered. Medication handout placed in the mail.  Provided patient with [facility] [place] [facility] phone number. Patient knows to call the office with questions or concerns. Oral Chemotherapy Navigation Clinic will continue to follow.  Darl Pikes, PharmD, BCPS, [facility] Hematology/Oncology Clinical Pharmacist [facility] [place] [facility] [phone]  07/25/2018 2:53 PM

## 2018-07-25 NOTE — Telephone Encounter (Signed)
Oral Oncology Patient Advocate Encounter  Prior Authorization for Garnetta Buddy has been approved.    PA# [id number] Effective dates: 07/25/18 through 01/24/19  Oral Oncology Clinic will continue to follow.   [place] Patient [place] Phone [phone] Fax [phone]

## 2018-07-25 NOTE — Progress Notes (Signed)
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  It is affecting her gait I recommend physical therapy and rehab and she agreed  Physical debility She has profound gait instability and weakness with excessive fatigue I recommend physical therapy and rehab referral She would like to be referred closer to home and I have completed referral to [place] rehab facility  Multiple lung nodules on CT She is not symptomatic PET CT scan showed improvement Per radiologist recommendation, we will repeat CT scan in 4 months, due around February 2020  Essential hypertension Her blood pressure is mildly low and she felt dizzy She is checking her blood pressure regularly at home and she is aware to stop amlodipine if she felt weak again   No orders of the defined types were placed in this encounter.   INTERVAL HISTORY: Please see below for problem oriented charting. 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 - TUMOR INVOLVES OVARY SURFACE AND LEFT FALLOPIAN TUBE. - SEE ONCOLOGY TABLE. 2. Mesentery, small bowel mesentery biopsy #1 - BENIGN FIBROADIPOSE TISSUE. 3. Ovary and fallopian tube, right - BENIGN OVARY WITH INCLUSION CYSTS. - BENIGN FALLOPIAN TUBE WITH PARATUBAL CYSTS AND ADENOFIBROMA. 4. Omentum, resection for tumor - BENIGN ADIPOSE TISSUE. 5. Peritoneum, biopsy, left diaphragmatic - BENIGN PERITONEAL TYPE TISSUE. 6. Peritoneum, biopsy, right diaphragmatic - BENIGN PERITONEAL TYPE TISSUE. 7. Mesentery, small bowel mesenteric biopsy #2 - BENIGN FIBROADIPOSE TISSUE. 8. Lymph nodes, regional resection, right pelvic - FIVE OF FIVE LYMPH NODES NEGATIVE FOR CARCINOMA (0/5). 9. Lymph nodes, regional resection, left pelvic - FOUR OF FOUR LYMPH NODES NEGATIVE FOR CARCINOMA (0/4). 10. Peritoneum, biopsy, left gutter - BENIGN PERITONEAL TYPE TISSUE. 11. Peritoneum, biopsy, bladder - BENIGN PERITONEAL TYPE TISSUE WITH ACUTE INFLAMMATION AND CALCIFICATIONS.  12. Peritoneum, biopsy, cul-de-sac - BENIGN PERITONEAL TYPE TISSUE WITH ACUTE INFLAMMATION AND CALCIFICATIONS. 13. Soft tissue, biopsy, right gutter - BENIGN FIBROADIPOSE TISSUE. 14. Lymph node, biopsy, right para-aortic - TWO OF TWO LYMPH NODES NEGATIVE FOR CARCINOMA (0/2). 15. Lymph node, biopsy, left para-aortic - ONE OF ONE LYMPH NODES NEGATIVE FOR CARCINOMA (0/1). Microscopic Comment 1. OVARY Specimen(s): Left ovary and fallopian tube. Procedure: (including lymph node sampling): Bilateral salpingo-oophorectomy with omental and peritoneal biopsies and lymph node biopsies.  Primary tumor site (including laterality): Left ovary. Ovarian surface involvement: Present. Ovarian capsule  intact without fragmentation: Intact. Maximum tumor size (cm): 11 cm total. Histologic type: Serous cystadenocarcinoma. Grade: High grade (low grade areas also present). Peritoneal implants: (specify invasive or non-invasive): N/A. Pelvic extension (list additional structures on separate lines and if involved): Left fallopian tube. Lymph nodes: number examined 12 ; number positive 0 TNM code: pT2a, pN0, pMX FIGO Stage (based on pathologic findings, needs clinical correlation): IIA Comments: None.    01/31/2018 Pathology Results    PERITONEAL WASHING (SPECIMEN 1 OF 1 COLLECTED 01/31/18): MALIGNANT CELLS PRESENT CONSISTENT WITH METASTATIC CARCINOMA.    01/31/2018 Surgery    Procedure(s) Performed:  1. Robotic BSO and washings. 2. Exploratory laparotomy, Staging including infragastric omentectomy, Bilateral pelvic and paraaortic lymphadenectomy, pertioneal biopsies.  Specimens: Bilateral tubes / ovaries, bilateral pelvic and paraaortic lymph nodes, peritoneal biopsies, washings and omentum.  Operative Findings: The left adnexa was adherent to the left pelvic peritoneum suspected due to the inferior retraction from the vaginal hysterectomy. Intraoperative leakage of cystic fluid from left ovary.  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  The right adnexa grossly appeared normal although slightly enlarged for her age.  There were some indurated lymph nodes but no overtly malignant lymph nodes were suspected.  Small bowel mesentery had 3-4 superficial possible implants.  1 of these was sent for frozen section returned benign.  She did have adhesive disease from her open cholecystectomy in the right upper quadrant.  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 16 mm exophytic lesion upper pole right kidney has attenuation too high to be a simple cyst. This may be a cyst complicated by proteinaceous debris or hemorrhage, but renal cell carcinoma is a concern. Routine outpatient follow-up MRI of the abdomen without and with contrast recommended after resolution of patient's acute symptoms (so she is better able to participate with positioning and breath holding). 2. Mild edema/possible minimal hemorrhage in the extraperitoneal soft tissues of the pelvic for tracking up both pelvic sidewalls. Imaging appearance is not outside of the spectrum of findings expected 6 days after the reported surgery. Trace interloop mesenteric and free fluid is identified in the peritoneal cavity in there is some mild edema in the omentum. There is no organized or rim enhancing collection to suggest evolving abscess. 3. Gas in the extraperitoneal soft tissues of the left lower quadrant is associated with gas in the subcutaneous fat of the lower left anterior abdominal wall. This is not unexpected on postoperative day 6. No evidence for intraperitoneal free air. 4. No CT features to suggest small bowel obstruction. Oral contrast material has migrated about halfway through the small bowel loops but there is no differential distention of proximal versus distal small bowel. The colon is diffusely distended and fluid-filled proximally, but formed stool is noted in the distal colon. Given apparent slow migration of contrast and fluid-filled small and large bowel loops, a component of ileus is possible.    02/14/2018 Imaging  MR abdomen Benign Bosniak category 2 cyst in upper pole of right kidney, corresponding with lesion seen on previous CT. No evidence of renal neoplasm or other  significant abnormality.     02/18/2018 Cancer Staging    Staging form: Ovary, Fallopian Tube, and Primary Peritoneal Carcinoma, AJCC 8th Edition - Pathologic: Stage II (pT2, pN0, cM0) - Signed by Heath Lark, MD on 02/18/2018    02/28/2018 Procedure    Status post right IJ port catheter placement. 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 For last cycle, taxol was omitted    07/18/2018 Imaging    1. No evidence for residual or recurrent tumor within the abdomen or pelvis. There has been interval resolution edema and fluid within the peritoneal cavity. 2. New pulmonary nodule is identified within the right lower lobe measuring 1.1 cm, image 87/4. Consider one of the following in 3 months for both low-risk and high-risk individuals: (a) repeat chest CT, (b) follow-up PET-CT, or (c) tissue sampling. This recommendation follows the consensus statement: Guidelines for Management of Incidental Pulmonary Nodules Detected on CT Images: From the Fleischner Society 2017; Radiology 2017; 284:228-243. 3. Stable 7 mm nodule within the right upper lobe. 4. Aortic Atherosclerosis (ICD10-I70.0). 5. Multi vessel coronary artery atherosclerotic calcifications.    07/18/2018 Tumor Marker    Patient's tumor was tested for the following markers: CA-125 Results of the tumor marker test revealed 14.9    07/25/2018 PET scan    1. Regressing right lower lobe peripheral lung lesion, likely resolving inflammatory or atelectatic process. No hypermetabolism to suggest malignancy. I would recommend a follow-up noncontrast chest CT and 4-6 months. Small upper lobe pulmonary nodules are stable. 2. No mesenteric or retroperitoneal lesions or areas of hypermetabolism to suggest  residual or recurrent ovarian cancer in the abdomen/pelvis.     REVIEW OF SYSTEMS:   Constitutional: Denies fevers, chills or abnormal weight loss Eyes: Denies blurriness of vision Ears, nose, mouth, throat, and face: Denies mucositis or sore throat Respiratory: Denies cough, dyspnea or wheezes Cardiovascular: Denies palpitation, chest discomfort or lower extremity swelling Gastrointestinal:  Denies nausea, heartburn or change in bowel habits Skin: Denies abnormal skin rashes Lymphatics: Denies new lymphadenopathy or easy bruising Behavioral/Psych: Mood is stable, no new changes  All other systems were reviewed with the patient and are negative.  I have reviewed the past medical history, past surgical history, social history and family history with the patient and they are unchanged from previous note.  ALLERGIES:  is allergic to penicillins.  MEDICATIONS:  Current Outpatient Medications  Medication Sig Dispense Refill  . atorvastatin (LIPITOR) 20 MG tablet Take 20 mg by mouth every evening.     . Cholecalciferol (VITAMIN D) 2000 units tablet Take 2,000 Units by mouth daily after breakfast.     . Cyanocobalamin (B-12 COMPLIANCE INJECTION) 1000 MCG/ML KIT Inject 1,000 mcg as directed every 30 (thirty) days.    Marland Kitchen docusate sodium (COLACE) 100 MG capsule Take 200 mg by mouth daily as needed (severe constipation).    Marland Kitchen exenatide (BYETTA) 10 MCG/0.04ML SOPN injection Inject 10 mcg into the skin 2 (two) times daily.     Marland Kitchen FLUoxetine (PROZAC) 20 MG capsule Take 20 mg by mouth daily after  breakfast.    . hydrochlorothiazide (HYDRODIURIL) 25 MG tablet Take 25 mg by mouth daily.    Marland Kitchen HYDROcodone-acetaminophen (NORCO/VICODIN) 5-325 MG tablet Take 1 tablet by mouth every 6 (six) hours as needed for moderate pain. 60 tablet 0  . levothyroxine (SYNTHROID, LEVOTHROID) 100 MCG tablet Take 100 mcg by mouth daily before breakfast.    . lidocaine-prilocaine (EMLA) cream Apply to affected area once 30 g 3   . losartan (COZAAR) 100 MG tablet Take 100 mg by mouth at bedtime.     . Melatonin 5 MG TABS Take 5-10 mg by mouth at bedtime as needed (sleep).     . metFORMIN (GLUCOPHAGE-XR) 500 MG 24 hr tablet Take 1,000 mg by mouth 2 (two) times daily.    . naproxen (NAPROSYN) 250 MG tablet Take 1 tablet (250 mg total) by mouth 2 (two) times daily with a meal. (Patient taking differently: Take 250 mg by mouth 2 (two) times daily as needed for mild pain or moderate pain. ) 60 tablet 2  . omeprazole (PRILOSEC OTC) 20 MG tablet Take 20 mg by mouth daily.    . ondansetron (ZOFRAN) 4 MG tablet Take 1 tablet (4 mg total) by mouth every 8 (eight) hours as needed for nausea or vomiting. (Patient not taking: Reported on 07/14/2018) 20 tablet 0  . ondansetron (ZOFRAN) 8 MG tablet Take 1 tablet (8 mg total) by mouth 2 (two) times daily as needed for refractory nausea / vomiting. Start on day 3 after chemo. (Patient not taking: Reported on 07/19/2018) 30 tablet 1  . prochlorperazine (COMPAZINE) 10 MG tablet Take 1 tablet (10 mg total) by mouth every 6 (six) hours as needed (Nausea or vomiting). (Patient not taking: Reported on 07/19/2018) 30 tablet 1  . rucaparib camsylate (RUBRACA) 300 MG tablet Take 2 tablets (600 mg total) by mouth 2 (two) times daily. 120 tablet 0   No current facility-administered medications for this visit.     PHYSICAL EXAMINATION: ECOG PERFORMANCE STATUS: 1 - Symptomatic but completely ambulatory  Vitals:   07/25/18 1230  BP: 128/64  Pulse: 81  Resp: 18  Temp: 97.9 F (36.6 C)  SpO2: 100%   Filed Weights   07/25/18 1230  Weight: 171 lb (77.6 kg)    GENERAL:alert, no distress and comfortable Musculoskeletal:no cyanosis of digits and no clubbing  NEURO: alert & oriented x 3 with fluent speech, no focal motor/sensory deficits.  She is noted to have unstable gait  LABORATORY DATA:  I have reviewed the data as listed    Component Value Date/Time   NA 141 07/18/2018 0938   K 4.0  07/18/2018 0938   CL 104 07/18/2018 0938   CO2 27 07/18/2018 0938   GLUCOSE 117 (H) 07/18/2018 0938   BUN 16 07/18/2018 0938   CREATININE 0.70 07/18/2018 0938   CALCIUM 10.0 07/18/2018 0938   PROT 6.8 07/18/2018 0938   ALBUMIN 4.1 07/18/2018 0938   AST 13 (L) 07/18/2018 0938   ALT 12 07/18/2018 0938   ALKPHOS 60 07/18/2018 0938   BILITOT 0.7 07/18/2018 0938   GFRNONAA >60 07/18/2018 0938   GFRAA >60 07/18/2018 0938    No results found for: SPEP, UPEP  Lab Results  Component Value Date   WBC 4.6 07/18/2018   NEUTROABS 2.7 07/18/2018   HGB 11.7 07/18/2018   HCT 34.1 (L) 07/18/2018   MCV 98.7 07/18/2018   PLT 132 (L) 07/18/2018      Chemistry      Component Value Date/Time  NA 141 07/18/2018 0938   K 4.0 07/18/2018 0938   CL 104 07/18/2018 0938   CO2 27 07/18/2018 0938   BUN 16 07/18/2018 0938   CREATININE 0.70 07/18/2018 0938      Component Value Date/Time   CALCIUM 10.0 07/18/2018 0938   ALKPHOS 60 07/18/2018 0938   AST 13 (L) 07/18/2018 0938   ALT 12 07/18/2018 0938   BILITOT 0.7 07/18/2018 0938       RADIOGRAPHIC STUDIES: I have reviewed multiple imaging study with the patient and family  I have personally reviewed the radiological images as listed and agreed with the findings in the report. Dg Chest 2 View  Result Date: 07/14/2018 CLINICAL DATA:  Port-A-Cath placement EXAM: CHEST - 2 VIEW COMPARISON:  02/28/2018, radiograph 12/01/2014 FINDINGS: Right central venous port slightly changed in position since fluoroscopic image 02/28/2018. There is a sharper curve at the apex of the catheter and the tip of the catheter is now positioned over the brachiocephalic confluence. There is no focal opacity or pleural effusion. The cardiomediastinal silhouette is normal. Aortic atherosclerosis. No pneumothorax. IMPRESSION: No active cardiopulmonary disease. Right-sided central venous port show slight interval change in position since fluoroscopic image 02/28/2018 as  described above. Tip of the catheter now projects over the brachiocephalic confluence. Electronically Signed   By: Donavan Foil M.D.   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 CONTRAST:  165m OMNIPAQUE IOHEXOL 300 MG/ML  SOLN COMPARISON:  02/05/2018 FINDINGS: CT CHEST FINDINGS Cardiovascular: Normal heart size. Aortic atherosclerosis. Aortic atherosclerosis. Calcification in the RCA, LAD coronary arteries noted. Mediastinum/Nodes: Normal appearance of the thyroid gland. The trachea appears patent and is midline. Small hiatal hernia. No supraclavicular or axillary adenopathy. No mediastinal or hilar adenopathy. Lungs/Pleura: No pleural effusions. 7 mm anterior right upper lobe lung nodule is identified, image 46/4. Unchanged. New nodule within the posteromedial right lower lobe measures 11 mm, image 87/4. Bronchiectasis noted within the right middle lobe. Likely postinflammatory. Scar identified within the posterolateral right lower lobe. Musculoskeletal: No aggressive lytic or sclerotic bone lesions. CT ABDOMEN PELVIS FINDINGS Hepatobiliary: No focal liver abnormality is seen. Status post cholecystectomy. No biliary dilatation. Pancreas: Unremarkable. No pancreatic ductal dilatation or surrounding inflammatory changes. Spleen: Normal in size without focal abnormality. Adrenals/Urinary Tract: Normal adrenal glands. Hyperdense lesion arising from upper pole of right kidney is again identified measuring 1.2 cm, image 56/2. Unchanged. No additional kidney abnormalities. No hydronephrosis. The urinary bladder is normal. Stomach/Bowel: Small hiatal hernia. The stomach is non-distended. No  abnormal small bowel dilatation. No pathologic dilatation of the colon. Vascular/Lymphatic: Aortic atherosclerosis.  No aneurysm. No retroperitoneal or mesenteric adenopathy. Reproductive: Status post hysterectomy. No adnexal masses. Other: No ascites or focal fluid collections identified. No peritoneal nodularity within the abdomen or pelvis. Musculoskeletal: No acute or significant osseous findings. IMPRESSION: 1. No evidence for residual or recurrent tumor within the abdomen or pelvis. There has been interval resolution edema and fluid within the peritoneal cavity. 2. New pulmonary nodule is identified within the right lower lobe measuring 1.1 cm, image 87/4. Consider one of the following in 3 months for both low-risk and high-risk individuals: (a) repeat chest CT, (b) follow-up PET-CT, or (c) tissue sampling. This recommendation follows the consensus statement: Guidelines  for Management of Incidental Pulmonary Nodules Detected on CT Images: From the Fleischner Society 2017; Radiology 2017; 284:228-243. 3. Stable 7 mm nodule within the right upper lobe. 4.  Aortic Atherosclerosis (ICD10-I70.0). 5. Multi vessel coronary artery atherosclerotic calcifications. Electronically Signed   By: Kerby Moors M.D.   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 CONTRAST:  16m OMNIPAQUE IOHEXOL 300 MG/ML  SOLN COMPARISON:  02/05/2018 FINDINGS: CT CHEST FINDINGS Cardiovascular: Normal heart size. Aortic atherosclerosis. Aortic atherosclerosis. Calcification in the RCA, LAD coronary arteries noted. Mediastinum/Nodes: Normal appearance of the thyroid gland. The trachea appears patent and is  midline. Small hiatal hernia. No supraclavicular or axillary adenopathy. No mediastinal or hilar adenopathy. Lungs/Pleura: No pleural effusions. 7 mm anterior right upper lobe lung nodule is identified, image 46/4. Unchanged. New nodule within the posteromedial right lower lobe measures 11 mm, image 87/4. Bronchiectasis noted within the right middle lobe. Likely postinflammatory. Scar identified within the posterolateral right lower lobe. Musculoskeletal: No aggressive lytic or sclerotic bone lesions. CT ABDOMEN PELVIS FINDINGS Hepatobiliary: No focal liver abnormality is seen. Status post cholecystectomy. No biliary dilatation. Pancreas: Unremarkable. No pancreatic ductal dilatation or surrounding inflammatory changes. Spleen: Normal in size without focal abnormality. Adrenals/Urinary Tract: Normal adrenal glands. Hyperdense lesion arising from upper pole of right kidney is again identified measuring 1.2 cm, image 56/2. Unchanged. No additional kidney abnormalities. No hydronephrosis. The urinary bladder is normal. Stomach/Bowel: Small hiatal hernia. The stomach is non-distended. No abnormal small bowel dilatation. No pathologic dilatation of the colon. Vascular/Lymphatic: Aortic atherosclerosis.  No aneurysm. No retroperitoneal or mesenteric adenopathy. Reproductive: Status post hysterectomy. No adnexal masses. Other: No ascites or focal fluid collections identified. No peritoneal nodularity within the abdomen or pelvis. Musculoskeletal: No acute or significant osseous findings. IMPRESSION: 1. No evidence for residual or recurrent tumor within the abdomen or pelvis. There has been interval resolution edema and fluid within the peritoneal cavity. 2. New pulmonary nodule is identified within the right lower lobe measuring 1.1 cm, image 87/4. Consider one of the following in 3 months for both low-risk and high-risk individuals: (a) repeat chest CT, (b) follow-up PET-CT, or (c) tissue sampling. This recommendation  follows the consensus statement: Guidelines for Management of Incidental Pulmonary Nodules Detected on CT Images: From the Fleischner Society 2017; Radiology 2017; 284:228-243. 3. Stable 7 mm nodule within the right upper lobe. 4.  Aortic Atherosclerosis (ICD10-I70.0). 5. Multi vessel coronary artery atherosclerotic calcifications. Electronically Signed   By: TKerby MoorsM.D.   On: 07/18/2018 15:13   Nm Pet Image Initial (pi) Skull Base To Thigh  Result Date: 07/25/2018 CLINICAL DATA:  Subsequent treatment strategy for ovarian cancer. New right lower lobe pulmonary nodule on recent chest CT. EXAM: NUCLEAR MEDICINE PET SKULL BASE TO THIGH TECHNIQUE: 8.3 mCi F-18 FDG was injected intravenously. Full-ring PET imaging was performed from the skull base to thigh after the radiotracer. CT data was obtained and used for attenuation correction and anatomic localization. Fasting blood glucose: 123 mg/dl COMPARISON:  CT scan 07/18/2018 FINDINGS: Mediastinal blood pool activity: SUV max 2.63 NECK: No hypermetabolic lymph nodes in the neck. Incidental CT findings: none CHEST: The right lower lobe lesions seen on the recent chest CT is smaller and less solid appearing  it measures 6.5 x 5.0 mm and previously measured 10.5 by 6.5 mm. No hypermetabolism is demonstrated. This is probably an area of resolving atelectasis or inflammation. Stable nodular density along the right major fissure on image number 34 is likely a lymph node. There is also a stable 6.5 mm right upper lobe nodule on image number 26. No hypermetabolism. No new or worrisome pulmonary lesions to suggest pulmonary metastatic disease. No breast masses, supraclavicular or axillary lymphadenopathy. No enlarged or hypermetabolic mediastinal or hilar lymph nodes. Incidental CT findings: Right-sided Port-A-Cath is in good position. No complicating features. ABDOMEN/PELVIS: No abnormal hypermetabolic activity within the liver, pancreas, adrenal glands, or spleen. No  hypermetabolic lymph nodes in the abdomen or pelvis. Incidental CT findings: Hyperdense/hemorrhagic cyst projecting off the upper pole region of the right kidney. No hypermetabolism. Moderate atherosclerotic calcifications involving the aorta and iliac arteries. Postoperative changes from hysterectomy and bilateral oophorectomy and omentectomy. SKELETON: No focal hypermetabolic activity to suggest skeletal metastasis. Incidental CT findings: none IMPRESSION: 1. Regressing right lower lobe peripheral lung lesion, likely resolving inflammatory or atelectatic process. No hypermetabolism to suggest malignancy. I would recommend a follow-up noncontrast chest CT and 4-6 months. Small upper lobe pulmonary nodules are stable. 2. 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 Electronically Signed   By: Marijo Sanes M.D.   On: 07/25/2018 11:16    All questions were answered. The patient knows to call the clinic with any problems, questions or concerns. No barriers to learning was detected.  I spent 25 minutes counseling the patient face to face. The total time spent in the appointment was 30 minutes and more than 50% was on counseling and review of test results  Heath Lark, MD 07/25/2018 1:30 PM

## 2018-07-25 NOTE — Assessment & Plan Note (Signed)
She has profound gait instability and weakness with excessive fatigue I recommend physical therapy and rehab referral She would like to be referred closer to home and I have completed referral to [facility] facility

## 2018-07-29 ENCOUNTER — Telehealth: Payer: Self-pay

## 2018-07-29 ENCOUNTER — Telehealth: Payer: Self-pay | Admitting: Oncology

## 2018-07-29 NOTE — Telephone Encounter (Signed)
Called [name] back and advised her that pharmacy is waiting for the apeal from her insurance to go through so that they can order the medication.  Advised her that she should be receiving a call today as soon as they can order it with further instructions.

## 2018-07-29 NOTE — Telephone Encounter (Signed)
Please tell her to start on 10/16 I have sent scheduling msg for labs only on 10/22 and then see me with labs on 10/29

## 2018-07-29 NOTE — Telephone Encounter (Signed)
Called and given below message. She verbalized understanding. Instructed to call for questions.

## 2018-07-29 NOTE — Telephone Encounter (Addendum)
Oral Oncology Patient Advocate Encounter  [employer] corrected the issue on their end and the claim went through at the pharmacy. Rubraca copay is $2431.44, Cancer care will take care of this and the patient will pay $0.   After speaking with Ms. Akshara, [place] will be filled at [facility] and shipped to her on 10/15, she will receive the medicine on 10/16.   [facility] Patient [place] Phone [phone] Fax [phone]

## 2018-07-29 NOTE — Telephone Encounter (Signed)
Emonnie called and had some questions about Rubraca - will it come mailorder and what day does she need to start taking it.  Left a message for Evelena Peat, Pharmacist and advised Alec that we will call her back.

## 2018-07-30 ENCOUNTER — Telehealth: Payer: Self-pay | Admitting: Oncology

## 2018-07-30 MED FILL — RUBRACA 300 MG TAB: 300 | 30 days supply | Qty: 120 | Fill #0

## 2018-07-30 NOTE — Telephone Encounter (Signed)
Oral Oncology Patient Advocate Encounter  Confirmed with [facility] that [place] shipped to the patient 07/30/18  [facility] [place] Phone [phone] Fax [phone]

## 2018-07-30 NOTE — Telephone Encounter (Signed)
[name] called and asked if it is ok to take biotin to help her hair grow back or a daily multivitamin.  Advised her that both would be OK to take.

## 2018-08-01 ENCOUNTER — Telehealth: Payer: Self-pay | Admitting: Oncology

## 2018-08-01 NOTE — Telephone Encounter (Signed)
[name] called and asked who had arranged the [place] for her.  Advised her that [place], [employer] had talked to her about the grant.

## 2018-08-01 NOTE — Telephone Encounter (Signed)
Delton See of message from [name].  She then said she had itching of her ears, toes, feet and heals after she took the Rubraca last night and this morning.  It was relieved last night after she took a benadryl tablet.  She is wondering if this will get better.

## 2018-08-01 NOTE — Telephone Encounter (Signed)
[name] was advised of message below and did not have any further questions.

## 2018-08-01 NOTE — Telephone Encounter (Signed)
Monte called and said she was just notified by her primary care doctor that she has a UTI and will need to take an antibiotic.  She is wondering if it is Ok to take [place] with the antibiotic.  She is not sure what antibiotic it will be yet.

## 2018-08-01 NOTE — Telephone Encounter (Signed)
Let's see how she feels when we hold it for antibiotics She can take benadryl 25 mg as needed

## 2018-08-01 NOTE — Telephone Encounter (Signed)
It's better to hold the pill while on antibiotics and resume after antibiotics are finished

## 2018-08-05 ENCOUNTER — Telehealth: Payer: Self-pay | Admitting: Oncology

## 2018-08-05 ENCOUNTER — Telehealth: Payer: Self-pay | Admitting: Hematology and Oncology

## 2018-08-05 NOTE — Telephone Encounter (Signed)
[place] was notified of message from [name].

## 2018-08-05 NOTE — Telephone Encounter (Signed)
Lai said she has a lab appointment tomorrow and is wondering if she still needs to have it done.  She started the Rubraca one day and then stopped it because she is taking Cipro for a UTI.  She will finish the Cipro tomorrow.  She said she is happy to come for labs but wasn't sure since she only took [place] for one day.

## 2018-08-05 NOTE — Telephone Encounter (Signed)
I cancelled it Resume [place] tomorrow

## 2018-08-05 NOTE — Telephone Encounter (Signed)
Appts scheduled pat notified / letter/calendar mailed per 10/14 sch msg

## 2018-08-06 ENCOUNTER — Inpatient Hospital Stay: Payer: Medicare HMO

## 2018-08-13 ENCOUNTER — Inpatient Hospital Stay: Payer: Medicare HMO

## 2018-08-13 ENCOUNTER — Telehealth: Payer: Self-pay | Admitting: Hematology and Oncology

## 2018-08-13 ENCOUNTER — Other Ambulatory Visit: Payer: Self-pay | Admitting: Hematology and Oncology

## 2018-08-13 ENCOUNTER — Encounter: Payer: Self-pay | Admitting: Hematology and Oncology

## 2018-08-13 ENCOUNTER — Inpatient Hospital Stay (HOSPITAL_BASED_OUTPATIENT_CLINIC_OR_DEPARTMENT_OTHER): Payer: Medicare HMO | Admitting: Hematology and Oncology

## 2018-08-13 DIAGNOSIS — N390 Urinary tract infection, site not specified: Secondary | ICD-10-CM

## 2018-08-13 DIAGNOSIS — R188 Other ascites: Secondary | ICD-10-CM

## 2018-08-13 DIAGNOSIS — Z9049 Acquired absence of other specified parts of digestive tract: Secondary | ICD-10-CM

## 2018-08-13 DIAGNOSIS — Z9221 Personal history of antineoplastic chemotherapy: Secondary | ICD-10-CM

## 2018-08-13 DIAGNOSIS — G620 Drug-induced polyneuropathy: Secondary | ICD-10-CM | POA: Diagnosis not present

## 2018-08-13 DIAGNOSIS — Z87440 Personal history of urinary (tract) infections: Secondary | ICD-10-CM

## 2018-08-13 DIAGNOSIS — Z90722 Acquired absence of ovaries, bilateral: Secondary | ICD-10-CM

## 2018-08-13 DIAGNOSIS — Z7984 Long term (current) use of oral hypoglycemic drugs: Secondary | ICD-10-CM

## 2018-08-13 DIAGNOSIS — J479 Bronchiectasis, uncomplicated: Secondary | ICD-10-CM

## 2018-08-13 DIAGNOSIS — R911 Solitary pulmonary nodule: Secondary | ICD-10-CM | POA: Diagnosis not present

## 2018-08-13 DIAGNOSIS — C562 Malignant neoplasm of left ovary: Secondary | ICD-10-CM

## 2018-08-13 DIAGNOSIS — T451X5A Adverse effect of antineoplastic and immunosuppressive drugs, initial encounter: Secondary | ICD-10-CM | POA: Diagnosis not present

## 2018-08-13 DIAGNOSIS — K5909 Other constipation: Secondary | ICD-10-CM

## 2018-08-13 DIAGNOSIS — Z90710 Acquired absence of both cervix and uterus: Secondary | ICD-10-CM

## 2018-08-13 DIAGNOSIS — Z79899 Other long term (current) drug therapy: Secondary | ICD-10-CM

## 2018-08-13 DIAGNOSIS — K449 Diaphragmatic hernia without obstruction or gangrene: Secondary | ICD-10-CM

## 2018-08-13 DIAGNOSIS — I10 Essential (primary) hypertension: Secondary | ICD-10-CM

## 2018-08-13 DIAGNOSIS — I700 Atherosclerosis of aorta: Secondary | ICD-10-CM

## 2018-08-13 LAB — URINALYSIS, COMPLETE (UACMP) WITH MICROSCOPIC
BILIRUBIN URINE: NEGATIVE
GLUCOSE, UA: NEGATIVE mg/dL
Hgb urine dipstick: NEGATIVE
KETONES UR: NEGATIVE mg/dL
Leukocytes, UA: NEGATIVE
NITRITE: NEGATIVE
PH: 6 (ref 5.0–8.0)
Protein, ur: 30 mg/dL — AB
Specific Gravity, Urine: 1.023 (ref 1.005–1.030)

## 2018-08-13 LAB — CBC WITH DIFFERENTIAL (CANCER CENTER ONLY)
Abs Immature Granulocytes: 0.02 10*3/uL (ref 0.00–0.07)
Basophils Absolute: 0 10*3/uL (ref 0.0–0.1)
Basophils Relative: 1 %
EOS ABS: 0.1 10*3/uL (ref 0.0–0.5)
Eosinophils Relative: 2 %
HCT: 34.1 % — ABNORMAL LOW (ref 36.0–46.0)
Hemoglobin: 11.2 g/dL — ABNORMAL LOW (ref 12.0–15.0)
Immature Granulocytes: 0 %
Lymphocytes Relative: 38 %
Lymphs Abs: 2.1 10*3/uL (ref 0.7–4.0)
MCH: 33.7 pg (ref 26.0–34.0)
MCHC: 32.8 g/dL (ref 30.0–36.0)
MCV: 102.7 fL — AB (ref 80.0–100.0)
MONO ABS: 0.4 10*3/uL (ref 0.1–1.0)
MONOS PCT: 6 %
NEUTROS PCT: 53 %
Neutro Abs: 2.9 10*3/uL (ref 1.7–7.7)
Platelet Count: 202 10*3/uL (ref 150–400)
RBC: 3.32 MIL/uL — ABNORMAL LOW (ref 3.87–5.11)
RDW: 14.7 % (ref 11.5–15.5)
WBC: 5.5 10*3/uL (ref 4.0–10.5)
nRBC: 0 % (ref 0.0–0.2)

## 2018-08-13 LAB — CMP (CANCER CENTER ONLY)
ALT: 17 U/L (ref 0–44)
ANION GAP: 8 (ref 5–15)
AST: 20 U/L (ref 15–41)
Albumin: 3.8 g/dL (ref 3.5–5.0)
Alkaline Phosphatase: 57 U/L (ref 38–126)
BUN: 13 mg/dL (ref 8–23)
CHLORIDE: 105 mmol/L (ref 98–111)
CO2: 28 mmol/L (ref 22–32)
Calcium: 9.5 mg/dL (ref 8.9–10.3)
Creatinine: 0.77 mg/dL (ref 0.44–1.00)
GFR, Estimated: >60 mL/min (ref >60)
Glucose, Bld: 103 mg/dL — ABNORMAL HIGH (ref 70–99)
POTASSIUM: 4 mmol/L (ref 3.5–5.1)
Sodium: 141 mmol/L (ref 135–145)
Total Bilirubin: 0.6 mg/dL (ref 0.3–1.2)
Total Protein: 6.4 g/dL — ABNORMAL LOW (ref 6.5–8.1)

## 2018-08-13 MED ORDER — HEPARIN SOD (PORK) LOCK FLUSH 100 UNIT/ML IV SOLN
500.0000 [IU] | Freq: Once | INTRAVENOUS | Status: AC
  Administered 2018-08-13: 500 [IU]
  Filled 2018-08-13: qty 5

## 2018-08-13 MED ORDER — SODIUM CHLORIDE 0.9% FLUSH
10.0000 mL | Freq: Once | INTRAVENOUS | Status: AC
  Administered 2018-08-13: 10 mL
  Filled 2018-08-13: qty 10

## 2018-08-13 MED ORDER — HYDROCODONE-ACETAMINOPHEN 5-325 MG PO TABS: 1.0000 | ORAL_TABLET | Freq: Four times a day (QID) | ORAL | 0 refills | Status: DC | PRN

## 2018-08-13 NOTE — Assessment & Plan Note (Signed)
She had recent UTI Plan to repeat urine culture and will call her with test results.  Currently, she is not symptomatic

## 2018-08-13 NOTE — Assessment & Plan Note (Signed)
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

## 2018-08-13 NOTE — Telephone Encounter (Signed)
Gave patient avs and calendar.  Patient having labs drawn from arm on 11/5 and 11/12.

## 2018-08-13 NOTE — Assessment & Plan Note (Signed)
She has persistent chronic constipation but much improved since discontinuation of chemotherapy She will continue laxative therapy

## 2018-08-13 NOTE — Assessment & Plan Note (Signed)
She tolerated Rubraca well without side effects She will return here weekly for blood count drawn and I will see her back in December for further follow-up

## 2018-08-13 NOTE — Progress Notes (Signed)
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  Currently, she is not symptomatic   No orders of the defined types were placed in this encounter.   INTERVAL HISTORY: Please see below for problem oriented charting. She returns for further follow-up She started taking Rubraca briefly and then developed urinary tract infection.  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 - TUMOR INVOLVES OVARY SURFACE AND LEFT FALLOPIAN TUBE. - SEE ONCOLOGY TABLE. 2. Mesentery, small bowel mesentery biopsy #1 - BENIGN FIBROADIPOSE TISSUE. 3. Ovary and fallopian tube,  right - BENIGN OVARY WITH INCLUSION CYSTS. - BENIGN FALLOPIAN TUBE WITH PARATUBAL CYSTS AND ADENOFIBROMA. 4. Omentum, resection for tumor - BENIGN ADIPOSE TISSUE. 5. Peritoneum, biopsy, left diaphragmatic - BENIGN PERITONEAL TYPE TISSUE. 6. Peritoneum, biopsy, right diaphragmatic - BENIGN PERITONEAL TYPE TISSUE. 7. Mesentery, small bowel mesenteric biopsy #2 - BENIGN FIBROADIPOSE TISSUE. 8. Lymph nodes, regional resection, right pelvic - FIVE OF FIVE LYMPH NODES NEGATIVE FOR CARCINOMA (0/5). 9. Lymph nodes, regional resection, left pelvic - FOUR OF FOUR LYMPH NODES NEGATIVE FOR CARCINOMA (0/4). 10. Peritoneum, biopsy, left gutter - BENIGN PERITONEAL TYPE TISSUE. 11. Peritoneum, biopsy, bladder - BENIGN PERITONEAL TYPE TISSUE WITH ACUTE INFLAMMATION AND CALCIFICATIONS.  12. Peritoneum, biopsy, cul-de-sac - BENIGN PERITONEAL TYPE TISSUE WITH ACUTE INFLAMMATION AND CALCIFICATIONS. 13. Soft tissue, biopsy, right gutter - BENIGN FIBROADIPOSE TISSUE. 14. Lymph node, biopsy, right para-aortic - TWO OF TWO LYMPH NODES NEGATIVE FOR CARCINOMA (0/2). 15. Lymph node, biopsy, left para-aortic - ONE OF ONE LYMPH NODES NEGATIVE FOR CARCINOMA (0/1). Microscopic Comment 1. OVARY Specimen(s): Left ovary and fallopian tube. Procedure: (including lymph node sampling): Bilateral salpingo-oophorectomy with omental and peritoneal biopsies and lymph node biopsies. Primary tumor site (including laterality): Left ovary. Ovarian surface involvement: Present. Ovarian capsule intact without fragmentation: Intact. Maximum tumor size (cm): 11 cm total. Histologic type: Serous cystadenocarcinoma. Grade: High grade (low grade areas also present). Peritoneal implants: (specify invasive or non-invasive): N/A. Pelvic extension (list additional structures on separate lines and if involved): Left fallopian tube. Lymph nodes: number examined 12 ; number positive 0 TNM code: pT2a, pN0, pMX FIGO Stage (based on  pathologic findings, needs clinical correlation): IIA Comments: None.  01/31/2018 Pathology Results    PERITONEAL WASHING (SPECIMEN 1 OF 1 COLLECTED 01/31/18): MALIGNANT CELLS PRESENT CONSISTENT WITH METASTATIC CARCINOMA.    01/31/2018 Surgery    Procedure(s) Performed:  1. Robotic BSO and washings. 2. Exploratory laparotomy, Staging including infragastric omentectomy, Bilateral pelvic and paraaortic lymphadenectomy, pertioneal biopsies.  Specimens: Bilateral tubes / ovaries, bilateral pelvic and paraaortic lymph nodes, peritoneal biopsies, washings and omentum.  Operative Findings: The left adnexa was adherent to the left pelvic peritoneum suspected due to the inferior retraction from the vaginal hysterectomy. Intraoperative leakage of cystic fluid from left ovary.  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  The right adnexa grossly appeared normal although slightly enlarged for her age.  There were some indurated lymph nodes but no overtly malignant lymph nodes were suspected.  Small bowel mesentery had 3-4 superficial possible implants.  1 of these was sent for frozen section returned benign.  She did have adhesive disease from her open cholecystectomy in the right upper quadrant.  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 16 mm exophytic lesion upper pole right kidney has attenuation too high to be a simple cyst. This may be a cyst complicated by proteinaceous debris or hemorrhage, but renal cell carcinoma is a concern. Routine outpatient  follow-up MRI of the abdomen without and with contrast recommended after resolution of patient's acute symptoms (so she is better able to participate with positioning and breath holding). 2. Mild edema/possible minimal hemorrhage in the extraperitoneal soft tissues of the pelvic for tracking up both pelvic sidewalls. Imaging appearance is not outside of the spectrum of findings expected 6 days after the reported surgery. Trace interloop mesenteric and free fluid is identified in the peritoneal cavity in there is some mild edema in the omentum. There is no organized or rim enhancing collection to suggest evolving abscess. 3. Gas in the extraperitoneal soft tissues of the left lower quadrant is associated with gas in the subcutaneous fat of the lower left anterior abdominal wall. This is not unexpected on postoperative day 6. No evidence for intraperitoneal free air. 4. No CT features to suggest small bowel obstruction. Oral contrast material has migrated about halfway through the small bowel loops but there is no differential distention of proximal versus distal small bowel. The colon is diffusely distended and fluid-filled proximally, but formed stool is noted in the distal colon. Given apparent slow migration of contrast and fluid-filled small and large bowel loops, a component of ileus is possible.    02/14/2018 Imaging    MR abdomen Benign Bosniak category 2 cyst in upper pole of right kidney, corresponding with lesion seen on previous CT. No evidence of renal neoplasm or other significant abnormality.     02/18/2018 Cancer Staging    Staging form: Ovary, Fallopian Tube, and Primary Peritoneal Carcinoma, AJCC 8th Edition - Pathologic: Stage II (pT2, pN0, cM0) - Signed by Heath Lark, MD on 02/18/2018    02/28/2018 Procedure    Status post right IJ port catheter placement. Catheter ready for use  03/01/2018 Tumor Marker    Patient's tumor was tested for the following markers: CA-125 Results of the  tumor marker test revealed 13.6    03/04/2018 - 06/18/2018 Chemotherapy    The patient had carboplatin and Taxol x 6 cycles with dose reduction due to neuropathy. For last cycle, taxol was omitted    07/18/2018 Imaging    1. No evidence for residual or recurrent tumor within the abdomen or pelvis. There has been interval resolution edema and fluid within the peritoneal cavity. 2. New pulmonary nodule is identified within the right lower lobe measuring 1.1 cm, image 87/4. Consider one of the following in 3 months for both low-risk and high-risk individuals: (a) repeat chest CT, (b) follow-up PET-CT, or (c) tissue sampling. This recommendation follows the consensus statement: Guidelines for Management of Incidental Pulmonary Nodules Detected on CT Images: From the Fleischner Society 2017; Radiology 2017; 284:228-243. 3. Stable 7 mm nodule within the right upper lobe. 4. Aortic Atherosclerosis (ICD10-I70.0). 5. Multi vessel coronary artery atherosclerotic calcifications.    07/18/2018 Tumor Marker    Patient's tumor was tested for the following markers: CA-125 Results of the tumor marker test revealed 14.9    07/25/2018 PET scan    1. Regressing right lower lobe peripheral lung lesion, likely resolving inflammatory or atelectatic process. No hypermetabolism to suggest malignancy. I would recommend a follow-up noncontrast chest CT and 4-6 months. Small upper lobe pulmonary nodules are stable. 2. 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     REVIEW OF SYSTEMS:   Constitutional: Denies fevers, chills or abnormal weight loss Eyes: Denies blurriness of vision Ears, nose, mouth, throat, and face: Denies mucositis or sore throat Respiratory: Denies cough, dyspnea or wheezes Cardiovascular: Denies palpitation, chest discomfort or lower extremity swelling Skin: Denies abnormal skin rashes Lymphatics:  Denies new lymphadenopathy or easy bruising Behavioral/Psych: Mood is stable, no new changes  All other systems were reviewed with the patient and are negative.  I have reviewed the past medical history, past surgical history, social history and family history with the patient and they are unchanged from previous note.  ALLERGIES:  is allergic to penicillins.  MEDICATIONS:  Current Outpatient Medications  Medication Sig Dispense Refill  . atorvastatin (LIPITOR) 20 MG tablet Take 20 mg by mouth every evening.     . Cholecalciferol (VITAMIN D) 2000 units tablet Take 2,000 Units by mouth daily after breakfast.     . Cyanocobalamin (B-12 COMPLIANCE INJECTION) 1000 MCG/ML KIT Inject 1,000 mcg as directed every 30 (thirty) days.    Marland Kitchen docusate sodium (COLACE) 100 MG capsule Take 200 mg by mouth daily as needed (severe constipation).    Marland Kitchen FLUoxetine (PROZAC) 20 MG capsule Take 20 mg by mouth daily after breakfast.    . hydrochlorothiazide (HYDRODIURIL) 25 MG tablet Take 25 mg by mouth daily.    Marland Kitchen HYDROcodone-acetaminophen (NORCO/VICODIN) 5-325 MG tablet Take 1 tablet by mouth every 6 (six) hours as needed for moderate pain. 60 tablet 0  . levothyroxine (SYNTHROID, LEVOTHROID) 100 MCG tablet Take 100 mcg by mouth daily before breakfast.    . lidocaine-prilocaine (EMLA) cream Apply to affected area once 30 g 3  . losartan (COZAAR) 100 MG tablet Take 100 mg by mouth at bedtime.     . Melatonin 5 MG TABS Take 5-10 mg by mouth at bedtime as needed (sleep).     . metFORMIN (GLUCOPHAGE-XR) 500 MG 24 hr tablet Take  1,000 mg by mouth 2 (two) times daily.    . naproxen (NAPROSYN) 250 MG tablet Take 1 tablet (250 mg total) by mouth 2 (two) times daily with a meal. (Patient taking differently: Take 250 mg by mouth 2 (two) times daily as needed for mild pain or moderate pain. ) 60 tablet 2  . omeprazole (PRILOSEC OTC) 20 MG tablet Take 20 mg by mouth daily.    . ondansetron (ZOFRAN) 4 MG tablet Take 1 tablet (4  mg total) by mouth every 8 (eight) hours as needed for nausea or vomiting. (Patient not taking: Reported on 07/14/2018) 20 tablet 0  . ondansetron (ZOFRAN) 8 MG tablet Take 1 tablet (8 mg total) by mouth 2 (two) times daily as needed for refractory nausea / vomiting. Start on day 3 after chemo. (Patient not taking: Reported on 07/19/2018) 30 tablet 1  . prochlorperazine (COMPAZINE) 10 MG tablet Take 1 tablet (10 mg total) by mouth every 6 (six) hours as needed (Nausea or vomiting). (Patient not taking: Reported on 07/19/2018) 30 tablet 1  . rucaparib camsylate (RUBRACA) 300 MG tablet Take 2 tablets (600 mg total) by mouth 2 (two) times daily. 120 tablet 0   No current facility-administered medications for this visit.     PHYSICAL EXAMINATION: ECOG PERFORMANCE STATUS: 1 - Symptomatic but completely ambulatory  Vitals:   08/13/18 1341  BP: 137/67  Pulse: 64  Resp: 18  Temp: 97.7 F (36.5 C)  SpO2: 100%   Filed Weights   08/13/18 1341  Weight: 171 lb 9.6 oz (77.8 kg)    GENERAL:alert, no distress and comfortable SKIN: skin color, texture, turgor are normal, no rashes or significant lesions Musculoskeletal:no cyanosis of digits and no clubbing  NEURO: alert & oriented x 3 with fluent speech, no focal motor/sensory deficits  LABORATORY DATA:  I have reviewed the data as listed    Component Value Date/Time   NA 141 07/18/2018 0938   K 4.0 07/18/2018 0938   CL 104 07/18/2018 0938   CO2 27 07/18/2018 0938   GLUCOSE 117 (H) 07/18/2018 0938   BUN 16 07/18/2018 0938   CREATININE 0.70 07/18/2018 0938   CALCIUM 10.0 07/18/2018 0938   PROT 6.8 07/18/2018 0938   ALBUMIN 4.1 07/18/2018 0938   AST 13 (L) 07/18/2018 0938   ALT 12 07/18/2018 0938   ALKPHOS 60 07/18/2018 0938   BILITOT 0.7 07/18/2018 0938   GFRNONAA >60 07/18/2018 0938   GFRAA >60 07/18/2018 0938    No results found for: SPEP, UPEP  Lab Results  Component Value Date   WBC 5.5 08/13/2018   NEUTROABS 2.9 08/13/2018    HGB 11.2 (L) 08/13/2018   HCT 34.1 (L) 08/13/2018   MCV 102.7 (H) 08/13/2018   PLT 202 08/13/2018      Chemistry      Component Value Date/Time   NA 141 07/18/2018 0938   K 4.0 07/18/2018 0938   CL 104 07/18/2018 0938   CO2 27 07/18/2018 0938   BUN 16 07/18/2018 0938   CREATININE 0.70 07/18/2018 0938      Component Value Date/Time   CALCIUM 10.0 07/18/2018 0938   ALKPHOS 60 07/18/2018 0938   AST 13 (L) 07/18/2018 0938   ALT 12 07/18/2018 0938   BILITOT 0.7 07/18/2018 0938       RADIOGRAPHIC STUDIES: I have personally reviewed the radiological images as listed and agreed with the findings in the report. Dg Chest 2 View  Result Date: 07/14/2018 CLINICAL DATA:  Port-A-Cath  placement EXAM: CHEST - 2 VIEW COMPARISON:  02/28/2018, radiograph 12/01/2014 FINDINGS: Right central venous port slightly changed in position since fluoroscopic image 02/28/2018. There is a sharper curve at the apex of the catheter and the tip of the catheter is now positioned over the brachiocephalic confluence. There is no focal opacity or pleural effusion. The cardiomediastinal silhouette is normal. Aortic atherosclerosis. No pneumothorax. IMPRESSION: No active cardiopulmonary disease. Right-sided central venous port show slight interval change in position since fluoroscopic image 02/28/2018 as described above. Tip of the catheter now projects over the brachiocephalic confluence. Electronically Signed   By: Donavan Foil M.D.   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 CONTRAST:  170m OMNIPAQUE IOHEXOL 300 MG/ML  SOLN COMPARISON:  02/05/2018 FINDINGS: CT CHEST FINDINGS Cardiovascular:  Normal heart size. Aortic atherosclerosis. Aortic atherosclerosis. Calcification in the RCA, LAD coronary arteries noted. Mediastinum/Nodes: Normal appearance of the thyroid gland. The trachea appears patent and is midline. Small hiatal hernia. No supraclavicular or axillary adenopathy. No mediastinal or hilar adenopathy. Lungs/Pleura: No pleural effusions. 7 mm anterior right upper lobe lung nodule is identified, image 46/4. Unchanged. New nodule within the posteromedial right lower lobe measures 11 mm, image 87/4. Bronchiectasis noted within the right middle lobe. Likely postinflammatory. Scar identified within the posterolateral right lower lobe. Musculoskeletal: No aggressive lytic or sclerotic bone lesions. CT ABDOMEN PELVIS FINDINGS Hepatobiliary: No focal liver abnormality is seen. Status post cholecystectomy. No biliary dilatation. Pancreas: Unremarkable. No pancreatic ductal dilatation or surrounding inflammatory changes. Spleen: Normal in size without focal abnormality. Adrenals/Urinary Tract: Normal adrenal glands. Hyperdense lesion arising from upper pole of right kidney is again identified measuring 1.2 cm, image 56/2. Unchanged. No additional kidney abnormalities. No hydronephrosis. The urinary bladder is normal. Stomach/Bowel: Small hiatal hernia. The stomach is non-distended. No abnormal small bowel dilatation. No pathologic dilatation of the colon. Vascular/Lymphatic: Aortic atherosclerosis.  No aneurysm. No retroperitoneal or mesenteric adenopathy. Reproductive: Status post hysterectomy. No adnexal masses. Other: No ascites or focal fluid collections identified. No peritoneal nodularity within the abdomen or pelvis. Musculoskeletal: No acute or significant osseous findings. IMPRESSION: 1. No evidence for residual or recurrent tumor within the abdomen or pelvis. There has been interval resolution edema and fluid within the peritoneal cavity. 2. New pulmonary nodule is identified within the right  lower lobe measuring 1.1 cm, image 87/4. Consider one of the following in 3 months for both low-risk and high-risk individuals: (a) repeat chest CT, (b) follow-up PET-CT, or (c) tissue sampling. This recommendation follows the consensus statement: Guidelines for Management of Incidental Pulmonary Nodules Detected on CT Images: From the Fleischner Society 2017; Radiology 2017; 284:228-243. 3. Stable 7 mm nodule within the right upper lobe. 4.  Aortic Atherosclerosis (ICD10-I70.0). 5. Multi vessel coronary artery atherosclerotic calcifications. Electronically Signed   By: TKerby MoorsM.D.   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 CONTRAST:  1046mOMNIPAQUE IOHEXOL 300 MG/ML  SOLN COMPARISON:  02/05/2018 FINDINGS: CT CHEST FINDINGS Cardiovascular: Normal  heart size. Aortic atherosclerosis. Aortic atherosclerosis. Calcification in the RCA, LAD coronary arteries noted. Mediastinum/Nodes: Normal appearance of the thyroid gland. The trachea appears patent and is midline. Small hiatal hernia. No supraclavicular or axillary adenopathy. No mediastinal or hilar adenopathy. Lungs/Pleura: No pleural effusions. 7 mm anterior right upper lobe lung nodule is identified, image 46/4. Unchanged. New nodule within the posteromedial right lower lobe measures 11 mm, image 87/4. Bronchiectasis noted within the right middle lobe. Likely postinflammatory. Scar identified within the posterolateral right lower lobe. Musculoskeletal: No aggressive lytic or sclerotic bone lesions. CT ABDOMEN PELVIS FINDINGS Hepatobiliary: No focal liver abnormality is seen. Status post cholecystectomy. No biliary  dilatation. Pancreas: Unremarkable. No pancreatic ductal dilatation or surrounding inflammatory changes. Spleen: Normal in size without focal abnormality. Adrenals/Urinary Tract: Normal adrenal glands. Hyperdense lesion arising from upper pole of right kidney is again identified measuring 1.2 cm, image 56/2. Unchanged. No additional kidney abnormalities. No hydronephrosis. The urinary bladder is normal. Stomach/Bowel: Small hiatal hernia. The stomach is non-distended. No abnormal small bowel dilatation. No pathologic dilatation of the colon. Vascular/Lymphatic: Aortic atherosclerosis.  No aneurysm. No retroperitoneal or mesenteric adenopathy. Reproductive: Status post hysterectomy. No adnexal masses. Other: No ascites or focal fluid collections identified. No peritoneal nodularity within the abdomen or pelvis. Musculoskeletal: No acute or significant osseous findings. IMPRESSION: 1. No evidence for residual or recurrent tumor within the abdomen or pelvis. There has been interval resolution edema and fluid within the peritoneal cavity. 2. New pulmonary nodule is identified within the right lower lobe measuring 1.1 cm, image 87/4. Consider one of the following in 3 months for both low-risk and high-risk individuals: (a) repeat chest CT, (b) follow-up PET-CT, or (c) tissue sampling. This recommendation follows the consensus statement: Guidelines for Management of Incidental Pulmonary Nodules Detected on CT Images: From the Fleischner Society 2017; Radiology 2017; 284:228-243. 3. Stable 7 mm nodule within the right upper lobe. 4.  Aortic Atherosclerosis (ICD10-I70.0). 5. Multi vessel coronary artery atherosclerotic calcifications. Electronically Signed   By: Kerby Moors M.D.   On: 07/18/2018 15:13   Nm Pet Image Initial (pi) Skull Base To Thigh  Result Date: 07/25/2018 CLINICAL DATA:  Subsequent treatment strategy for ovarian cancer. New right lower lobe pulmonary nodule on recent chest CT. EXAM: NUCLEAR  MEDICINE PET SKULL BASE TO THIGH TECHNIQUE: 8.3 mCi F-18 FDG was injected intravenously. Full-ring PET imaging was performed from the skull base to thigh after the radiotracer. CT data was obtained and used for attenuation correction and anatomic localization. Fasting blood glucose: 123 mg/dl COMPARISON:  CT scan 07/18/2018 FINDINGS: Mediastinal blood pool activity: SUV max 2.63 NECK: No hypermetabolic lymph nodes in the neck. Incidental CT findings: none CHEST: The right lower lobe lesions seen on the recent chest CT is smaller and less solid appearing it measures 6.5 x 5.0 mm and previously measured 10.5 by 6.5 mm. No hypermetabolism is demonstrated. This is probably an area of resolving atelectasis or inflammation. Stable nodular density along the right major fissure on image number 34 is likely a lymph node. There is also a stable 6.5 mm right upper lobe nodule on image number 26. No hypermetabolism. No new or worrisome pulmonary lesions to suggest pulmonary metastatic disease. No breast masses, supraclavicular or axillary lymphadenopathy. No enlarged or hypermetabolic mediastinal or hilar lymph nodes. Incidental CT findings: Right-sided Port-A-Cath is in good position. No complicating features. ABDOMEN/PELVIS: No abnormal hypermetabolic activity within the liver, pancreas, adrenal glands, or spleen. No hypermetabolic lymph nodes in the abdomen or pelvis.  Incidental CT findings: Hyperdense/hemorrhagic cyst projecting off the upper pole region of the right kidney. No hypermetabolism. Moderate atherosclerotic calcifications involving the aorta and iliac arteries. Postoperative changes from hysterectomy and bilateral oophorectomy and omentectomy. SKELETON: No focal hypermetabolic activity to suggest skeletal metastasis. Incidental CT findings: none IMPRESSION: 1. Regressing right lower lobe peripheral lung lesion, likely resolving inflammatory or atelectatic process. No hypermetabolism to suggest malignancy. I  would recommend a follow-up noncontrast chest CT and 4-6 months. Small upper lobe pulmonary nodules are stable. 2. 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 Electronically Signed   By: Marijo Sanes M.D.   On: 07/25/2018 11:16    All questions were answered. The patient knows to call the clinic with any problems, questions or concerns. No barriers to learning was detected.  I spent 15 minutes counseling the patient face to face. The total time spent in the appointment was 20 minutes and more than 50% was on counseling and review of test results  Heath Lark, MD 08/13/2018 2:45 PM

## 2018-08-14 LAB — URINE CULTURE

## 2018-08-14 LAB — CA 125: Cancer Antigen (CA) 125: 13.3 U/mL (ref 0.0–38.1)

## 2018-08-15 ENCOUNTER — Telehealth: Payer: Self-pay

## 2018-08-15 NOTE — Telephone Encounter (Signed)
-----   Message from Heath Lark, MD sent at 08/13/2018  2:47 PM EDT ----- Regarding: recent UTI I repeated urine culture today Final result should be out by Thrusday Please review test result with her

## 2018-08-15 NOTE — Telephone Encounter (Signed)
Called and told urine culture had no growth. She denies urinary symptoms. Instructed to call office or go back to PCP if needed for urinary symptoms. She verbalized understanding.  She asked if it is okay to take Rubraca with her other medication or alone? Per Mosetta Pigeon, pharmacist it is okay to take with or without her other medications. There is no interactions. She verbalized understanding.

## 2018-08-20 ENCOUNTER — Telehealth: Payer: Self-pay | Admitting: Oncology

## 2018-08-20 ENCOUNTER — Telehealth: Payer: Self-pay | Admitting: Pharmacist

## 2018-08-20 ENCOUNTER — Inpatient Hospital Stay: Payer: Medicare HMO | Attending: Gynecologic Oncology

## 2018-08-20 DIAGNOSIS — C562 Malignant neoplasm of left ovary: Secondary | ICD-10-CM | POA: Diagnosis present

## 2018-08-20 LAB — CBC WITH DIFFERENTIAL (CANCER CENTER ONLY)
Abs Immature Granulocytes: 0.01 K/uL (ref 0.00–0.07)
Basophils Absolute: 0.1 K/uL (ref 0.0–0.1)
Basophils Relative: 1 %
Eosinophils Absolute: 0.1 K/uL (ref 0.0–0.5)
Eosinophils Relative: 2 %
HCT: 36.4 % (ref 36.0–46.0)
Hemoglobin: 11.8 g/dL — ABNORMAL LOW (ref 12.0–15.0)
Immature Granulocytes: 0 %
Lymphocytes Relative: 37 %
Lymphs Abs: 2.1 K/uL (ref 0.7–4.0)
MCH: 33.1 pg (ref 26.0–34.0)
MCHC: 32.4 g/dL (ref 30.0–36.0)
MCV: 102 fL — ABNORMAL HIGH (ref 80.0–100.0)
Monocytes Absolute: 0.4 K/uL (ref 0.1–1.0)
Monocytes Relative: 6 %
Neutro Abs: 3.1 K/uL (ref 1.7–7.7)
Neutrophils Relative %: 54 %
Platelet Count: 181 K/uL (ref 150–400)
RBC: 3.57 MIL/uL — ABNORMAL LOW (ref 3.87–5.11)
RDW: 14.5 % (ref 11.5–15.5)
WBC Count: 5.7 K/uL (ref 4.0–10.5)
nRBC: 0 % (ref 0.0–0.2)

## 2018-08-20 LAB — CMP (CANCER CENTER ONLY)
ALT: 21 U/L (ref 0–44)
AST: 24 U/L (ref 15–41)
Albumin: 3.9 g/dL (ref 3.5–5.0)
Alkaline Phosphatase: 62 U/L (ref 38–126)
Anion gap: 9 (ref 5–15)
BUN: 12 mg/dL (ref 8–23)
CO2: 25 mmol/L (ref 22–32)
Calcium: 9.8 mg/dL (ref 8.9–10.3)
Chloride: 106 mmol/L (ref 98–111)
Creatinine: 0.8 mg/dL (ref 0.44–1.00)
GFR, Est AFR Am: >60 mL/min
GFR, Estimated: >60 mL/min
Glucose, Bld: 96 mg/dL (ref 70–99)
Potassium: 4.2 mmol/L (ref 3.5–5.1)
Sodium: 140 mmol/L (ref 135–145)
Total Bilirubin: 0.6 mg/dL (ref 0.3–1.2)
Total Protein: 6.6 g/dL (ref 6.5–8.1)

## 2018-08-20 NOTE — Telephone Encounter (Signed)
Oral Oncology Pharmacist Encounter  Received notification from clinical pharmacist at the [facility] as well as information from Elmo Putt, gynecology nurse navigator, that patient had been taking her Rubraca incorrectly.  Patient had been taking Rubraca 300 mg tablets, 2 tablets (600 mg) by mouth once daily. Patient is supposed to be taking Rubraca 300 mg tablets, 2 tablets (600 mg) by mouth 2 times daily.  Correct administration was discussed with patient by both the pharmacist at dispensing pharmacy and by nurse navigator.  Patient will start taking her Rubraca 600 mg twice daily today (08/20/2018).  Patient with lab check in the office today. All labs remain within normal limits.  Patient states she is not experiencing any side effects from the [place] as of yet. Patient instructed that she may experience some side effects due to her Rubraca now that she is taking prescribed dosing.  Patient knows to call the office with any additional questions or concerns. We will continue to monitor labs weekly during Rubraca initiation period.  Johny Drilling, PharmD, BCPS, BCOP  08/20/2018 2:37 PM [facility]cology [facility] [phone]

## 2018-08-20 NOTE — Telephone Encounter (Signed)
Called [name] and let her know that her labs from today are good and she is OK to continue Rubraca at the full dose per Emeline Darling, [facility].

## 2018-08-20 NOTE — Telephone Encounter (Signed)
[name] called and said she has been taking the Rubraca wrong.  She has been taking 1 tablet in the morning and 1 tablet in the evening when she was supposed to be taking 2 tablets twice a day.  She said she was going by memory and happened to look at the label today which she said was on the plastic bag, not the bottles.  She did start taking 2 tablets this morning.

## 2018-08-21 LAB — CA 125: Cancer Antigen (CA) 125: 13.1 U/mL (ref 0.0–38.1)

## 2018-08-27 ENCOUNTER — Inpatient Hospital Stay: Payer: Medicare HMO

## 2018-08-27 ENCOUNTER — Telehealth: Payer: Self-pay

## 2018-08-27 DIAGNOSIS — C562 Malignant neoplasm of left ovary: Secondary | ICD-10-CM

## 2018-08-27 LAB — CMP (CANCER CENTER ONLY)
ALT: 24 U/L (ref 0–44)
AST: 27 U/L (ref 15–41)
Albumin: 4 g/dL (ref 3.5–5.0)
Alkaline Phosphatase: 74 U/L (ref 38–126)
Anion gap: 8 (ref 5–15)
BUN: 10 mg/dL (ref 8–23)
CHLORIDE: 106 mmol/L (ref 98–111)
CO2: 26 mmol/L (ref 22–32)
CREATININE: 0.86 mg/dL (ref 0.44–1.00)
Calcium: 9.8 mg/dL (ref 8.9–10.3)
GFR, Est AFR Am: >60 mL/min (ref >60)
Glucose, Bld: 120 mg/dL — ABNORMAL HIGH (ref 70–99)
Potassium: 4.5 mmol/L (ref 3.5–5.1)
SODIUM: 140 mmol/L (ref 135–145)
Total Bilirubin: 0.6 mg/dL (ref 0.3–1.2)
Total Protein: 6.7 g/dL (ref 6.5–8.1)

## 2018-08-27 LAB — CBC WITH DIFFERENTIAL (CANCER CENTER ONLY)
Abs Immature Granulocytes: 0.02 10*3/uL (ref 0.00–0.07)
Basophils Absolute: 0.1 10*3/uL (ref 0.0–0.1)
Basophils Relative: 1 %
EOS PCT: 5 %
Eosinophils Absolute: 0.2 10*3/uL (ref 0.0–0.5)
HEMATOCRIT: 37.1 % (ref 36.0–46.0)
HEMOGLOBIN: 12.1 g/dL (ref 12.0–15.0)
Immature Granulocytes: 0 %
LYMPHS ABS: 1.7 10*3/uL (ref 0.7–4.0)
LYMPHS PCT: 32 %
MCH: 33.1 pg (ref 26.0–34.0)
MCHC: 32.6 g/dL (ref 30.0–36.0)
MCV: 101.4 fL — ABNORMAL HIGH (ref 80.0–100.0)
MONO ABS: 0.3 10*3/uL (ref 0.1–1.0)
MONOS PCT: 6 %
Neutro Abs: 3 10*3/uL (ref 1.7–7.7)
Neutrophils Relative %: 56 %
Platelet Count: 153 10*3/uL (ref 150–400)
RBC: 3.66 MIL/uL — ABNORMAL LOW (ref 3.87–5.11)
RDW: 14.6 % (ref 11.5–15.5)
WBC Count: 5.3 10*3/uL (ref 4.0–10.5)
nRBC: 0 % (ref 0.0–0.2)

## 2018-08-27 NOTE — Telephone Encounter (Signed)
Called and given lab results. Labs reviewed with Gavin Pound, pharmacist at [facility]. Instructed to continue Rubraca as ordered. She verbalized understanding.

## 2018-08-28 LAB — CA 125: CANCER ANTIGEN (CA) 125: 12.7 U/mL (ref 0.0–38.1)

## 2018-08-30 ENCOUNTER — Telehealth: Payer: Self-pay | Admitting: Pharmacist

## 2018-08-30 ENCOUNTER — Telehealth: Payer: Self-pay | Admitting: Oncology

## 2018-08-30 NOTE — Telephone Encounter (Signed)
Oral Oncology Pharmacist Encounter  Received call from patient with questions about Rubraca refill. Patient states she has about 10 days of medicine left. Patient states she is taking Rubraca 300 mg tablets, 2 tablets (600 mg) by mouth 2 times daily, without regard to meals. She has been administering Rubraca as prescribed since 08/20/2018.  Labs remain stable on lab check performed 08/27/2018 We discussed her results from her CBC, CA 125, and CMET  Patient reports that she is not experiencing any GI side effects, patient denies nausea, patient denies diarrhea, patient states her constipation remains at baseline.  Patient does endorse fatigue that is prohibitive to some of the activities that she was able to perform prior to starting on Rubraca. She states that she gets tired around 2 or 3 PM daily. She has not been able to work in her garden, she has not been able to clean her rooftop. She will alert the office if her fatigue continues to progress.  We extensively discussed remote chance of the development of MDS or AML in the future. All questions answered. Patient states that this pharmacist was able to make her feel much more comfortable about Rubraca administration.  She request the pharmacy contact her on Monday (09/02/2018) to perform her next refill call. This has been updated in the [facility] computer system. I have also updated preferred contact number in their system.  Patient expressed understanding and appreciation. She knows to call the office with any additional questions or concerns.  Johny Drilling, PharmD, BCPS, BCOP  08/30/2018 11:59 AM Oral Oncology Clinic [phone]

## 2018-08-30 NOTE — Telephone Encounter (Signed)
Vondell called and said she has a question for pharmacy regarding a Rubraca refill.  Gave her the number for Krista, CP[facility].  She also said she is having problems with one of her eyes where she said it is like looking through cobwebs.  She said her eye doctor wants to do surgery and she wants to check and make sure it is ok with her medications.  She will see [name] on 09/21/18 and was advised to discuss it with her then.

## 2018-09-02 ENCOUNTER — Other Ambulatory Visit: Payer: Self-pay | Admitting: Hematology and Oncology

## 2018-09-02 DIAGNOSIS — C562 Malignant neoplasm of left ovary: Secondary | ICD-10-CM

## 2018-09-03 ENCOUNTER — Inpatient Hospital Stay: Payer: Medicare HMO

## 2018-09-03 ENCOUNTER — Telehealth: Payer: Self-pay | Admitting: *Deleted

## 2018-09-03 ENCOUNTER — Other Ambulatory Visit: Payer: Self-pay | Admitting: *Deleted

## 2018-09-03 DIAGNOSIS — C562 Malignant neoplasm of left ovary: Secondary | ICD-10-CM | POA: Diagnosis not present

## 2018-09-03 LAB — CBC WITH DIFFERENTIAL (CANCER CENTER ONLY)
Abs Immature Granulocytes: 0.01 10*3/uL (ref 0.00–0.07)
Basophils Absolute: 0.1 10*3/uL (ref 0.0–0.1)
Basophils Relative: 1 %
Eosinophils Absolute: 0.3 10*3/uL (ref 0.0–0.5)
Eosinophils Relative: 5 %
HCT: 34.4 % — ABNORMAL LOW (ref 36.0–46.0)
Hemoglobin: 11.4 g/dL — ABNORMAL LOW (ref 12.0–15.0)
IMMATURE GRANULOCYTES: 0 %
LYMPHS ABS: 1.7 10*3/uL (ref 0.7–4.0)
Lymphocytes Relative: 31 %
MCH: 33.4 pg (ref 26.0–34.0)
MCHC: 33.1 g/dL (ref 30.0–36.0)
MCV: 100.9 fL — AB (ref 80.0–100.0)
MONOS PCT: 6 %
Monocytes Absolute: 0.3 10*3/uL (ref 0.1–1.0)
NEUTROS PCT: 57 %
Neutro Abs: 3.3 10*3/uL (ref 1.7–7.7)
Platelet Count: 147 10*3/uL — ABNORMAL LOW (ref 150–400)
RBC: 3.41 MIL/uL — ABNORMAL LOW (ref 3.87–5.11)
RDW: 14.6 % (ref 11.5–15.5)
WBC Count: 5.6 10*3/uL (ref 4.0–10.5)
nRBC: 0 % (ref 0.0–0.2)

## 2018-09-03 LAB — CMP (CANCER CENTER ONLY)
ALBUMIN: 3.8 g/dL (ref 3.5–5.0)
ALK PHOS: 77 U/L (ref 38–126)
ALT: 31 U/L (ref 0–44)
ANION GAP: 9 (ref 5–15)
AST: 37 U/L (ref 15–41)
BUN: 15 mg/dL (ref 8–23)
CALCIUM: 9.7 mg/dL (ref 8.9–10.3)
CO2: 26 mmol/L (ref 22–32)
Chloride: 105 mmol/L (ref 98–111)
Creatinine: 0.86 mg/dL (ref 0.44–1.00)
GFR, Est AFR Am: >60 mL/min (ref >60)
GFR, Estimated: >60 mL/min (ref >60)
GLUCOSE: 100 mg/dL — AB (ref 70–99)
Potassium: 4.1 mmol/L (ref 3.5–5.1)
SODIUM: 140 mmol/L (ref 135–145)
Total Bilirubin: 0.8 mg/dL (ref 0.3–1.2)
Total Protein: 6.5 g/dL (ref 6.5–8.1)

## 2018-09-03 MED ORDER — HEPARIN SOD (PORK) LOCK FLUSH 100 UNIT/ML IV SOLN
500.0000 [IU] | Freq: Once | INTRAVENOUS | Status: AC
  Administered 2018-09-03: 500 [IU] via INTRAVENOUS
  Filled 2018-09-03: qty 5

## 2018-09-03 MED ORDER — LIDOCAINE-PRILOCAINE 2.5-2.5 % EX CREA: TOPICAL_CREAM | CUTANEOUS | 3 refills | Status: DC

## 2018-09-03 MED ORDER — SODIUM CHLORIDE 0.9% FLUSH
10.0000 mL | Freq: Once | INTRAVENOUS | Status: AC
  Administered 2018-09-03: 10 mL via INTRAVENOUS
  Filled 2018-09-03: qty 10

## 2018-09-03 NOTE — Telephone Encounter (Signed)
LM that labs were reviewed with Evelena Peat in pharmacy. OK to continue rubraca.   Pt called back and spoke with Elmo Putt, RN Navigator

## 2018-09-03 NOTE — Progress Notes (Signed)
09/03/2018- This RN spoke with Dr. Candise Che regarding patient port placement from [facility] on 07/25/2018.  Report states "Right-sided Port-A-Cath is in good position.  Per Dr. Candise Che who looked over imaging again, "Righted-sided Port-A-Cath is in the distal SVC".

## 2018-09-04 LAB — CA 125: Cancer Antigen (CA) 125: 13.3 U/mL (ref 0.0–38.1)

## 2018-09-05 MED FILL — RUBRACA 300 MG TAB: 300 | 30 days supply | Qty: 120 | Fill #0

## 2018-09-10 ENCOUNTER — Telehealth: Payer: Self-pay | Admitting: Oncology

## 2018-09-10 NOTE — Telephone Encounter (Signed)
Called Cristie and notified her of message below.  She verbalized understanding and agreement.

## 2018-09-10 NOTE — Telephone Encounter (Signed)
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  There are no medication interactions with gabapentin and Rubraca.  Fatigue is definitely an effect of treatment, it is noted in about 70% of patients on Rubraca. Depending on level of fatigue, and ability to perform ADLs, dose may need to be adjusted in the future.  There has not been any reported peripheral neuropathy associated with the use of Rubraca. However, neuromuscular and skeletal weakness does occur at an incidence of about 70% with Rubraca use.  Johny Drilling, PharmD, BCPS, BCOP  09/10/2018 12:16 PM Oral Oncology Clinic [phone]

## 2018-09-10 NOTE — Telephone Encounter (Signed)
[name] called and said she is very fatigued and is having joint pain and neuropathy in her feet.  She is going to physical therapy but is wondering if she should start taking gabapentin again.

## 2018-09-17 ENCOUNTER — Inpatient Hospital Stay: Payer: Medicare HMO | Attending: Gynecologic Oncology

## 2018-09-17 ENCOUNTER — Other Ambulatory Visit: Payer: Self-pay | Admitting: Hematology and Oncology

## 2018-09-17 ENCOUNTER — Other Ambulatory Visit: Payer: Self-pay | Admitting: Oncology

## 2018-09-17 ENCOUNTER — Telehealth: Payer: Self-pay | Admitting: Oncology

## 2018-09-17 ENCOUNTER — Inpatient Hospital Stay: Payer: Medicare HMO

## 2018-09-17 DIAGNOSIS — Z7984 Long term (current) use of oral hypoglycemic drugs: Secondary | ICD-10-CM | POA: Diagnosis not present

## 2018-09-17 DIAGNOSIS — T451X5A Adverse effect of antineoplastic and immunosuppressive drugs, initial encounter: Secondary | ICD-10-CM | POA: Diagnosis not present

## 2018-09-17 DIAGNOSIS — M549 Dorsalgia, unspecified: Secondary | ICD-10-CM | POA: Diagnosis not present

## 2018-09-17 DIAGNOSIS — Z9221 Personal history of antineoplastic chemotherapy: Secondary | ICD-10-CM | POA: Diagnosis not present

## 2018-09-17 DIAGNOSIS — R5381 Other malaise: Secondary | ICD-10-CM | POA: Diagnosis not present

## 2018-09-17 DIAGNOSIS — N281 Cyst of kidney, acquired: Secondary | ICD-10-CM | POA: Diagnosis not present

## 2018-09-17 DIAGNOSIS — K5900 Constipation, unspecified: Secondary | ICD-10-CM | POA: Diagnosis not present

## 2018-09-17 DIAGNOSIS — G620 Drug-induced polyneuropathy: Secondary | ICD-10-CM | POA: Diagnosis not present

## 2018-09-17 DIAGNOSIS — Z79899 Other long term (current) drug therapy: Secondary | ICD-10-CM | POA: Diagnosis not present

## 2018-09-17 DIAGNOSIS — R609 Edema, unspecified: Secondary | ICD-10-CM | POA: Insufficient documentation

## 2018-09-17 DIAGNOSIS — R918 Other nonspecific abnormal finding of lung field: Secondary | ICD-10-CM | POA: Insufficient documentation

## 2018-09-17 DIAGNOSIS — F5104 Psychophysiologic insomnia: Secondary | ICD-10-CM | POA: Diagnosis not present

## 2018-09-17 DIAGNOSIS — C562 Malignant neoplasm of left ovary: Secondary | ICD-10-CM | POA: Diagnosis not present

## 2018-09-17 DIAGNOSIS — G8929 Other chronic pain: Secondary | ICD-10-CM | POA: Insufficient documentation

## 2018-09-17 LAB — CBC WITH DIFFERENTIAL (CANCER CENTER ONLY)
Abs Immature Granulocytes: 0.02 10*3/uL (ref 0.00–0.07)
Basophils Absolute: 0 10*3/uL (ref 0.0–0.1)
Basophils Relative: 1 %
Eosinophils Absolute: 0.2 10*3/uL (ref 0.0–0.5)
Eosinophils Relative: 4 %
HCT: 32.4 % — ABNORMAL LOW (ref 36.0–46.0)
Hemoglobin: 10.9 g/dL — ABNORMAL LOW (ref 12.0–15.0)
IMMATURE GRANULOCYTES: 0 %
Lymphocytes Relative: 34 %
Lymphs Abs: 1.8 10*3/uL (ref 0.7–4.0)
MCH: 34 pg (ref 26.0–34.0)
MCHC: 33.6 g/dL (ref 30.0–36.0)
MCV: 100.9 fL — ABNORMAL HIGH (ref 80.0–100.0)
Monocytes Absolute: 0.3 10*3/uL (ref 0.1–1.0)
Monocytes Relative: 6 %
Neutro Abs: 2.9 10*3/uL (ref 1.7–7.7)
Neutrophils Relative %: 55 %
Platelet Count: 129 10*3/uL — ABNORMAL LOW (ref 150–400)
RBC: 3.21 MIL/uL — ABNORMAL LOW (ref 3.87–5.11)
RDW: 15 % (ref 11.5–15.5)
WBC Count: 5.3 10*3/uL (ref 4.0–10.5)
nRBC: 0 % (ref 0.0–0.2)

## 2018-09-17 LAB — CMP (CANCER CENTER ONLY)
ALT: 34 U/L (ref 0–44)
AST: 36 U/L (ref 15–41)
Albumin: 4 g/dL (ref 3.5–5.0)
Alkaline Phosphatase: 77 U/L (ref 38–126)
Anion gap: 9 (ref 5–15)
BILIRUBIN TOTAL: 0.9 mg/dL (ref 0.3–1.2)
BUN: 14 mg/dL (ref 8–23)
CO2: 25 mmol/L (ref 22–32)
Calcium: 9.7 mg/dL (ref 8.9–10.3)
Chloride: 104 mmol/L (ref 98–111)
Creatinine: 0.95 mg/dL (ref 0.44–1.00)
GFR, Est AFR Am: >60 mL/min (ref >60)
GFR, Estimated: 58 mL/min — ABNORMAL LOW (ref >60)
Glucose, Bld: 96 mg/dL (ref 70–99)
POTASSIUM: 4.4 mmol/L (ref 3.5–5.1)
Sodium: 138 mmol/L (ref 135–145)
TOTAL PROTEIN: 6.5 g/dL (ref 6.5–8.1)

## 2018-09-17 MED ORDER — SODIUM CHLORIDE 0.9% FLUSH
10.0000 mL | Freq: Once | INTRAVENOUS | Status: AC
  Administered 2018-09-17: 10 mL
  Filled 2018-09-17: qty 10

## 2018-09-17 MED ORDER — HYDROCODONE-ACETAMINOPHEN 5-325 MG PO TABS: 1.0000 | ORAL_TABLET | Freq: Four times a day (QID) | ORAL | 0 refills | Status: DC | PRN

## 2018-09-17 MED ORDER — HEPARIN SOD (PORK) LOCK FLUSH 100 UNIT/ML IV SOLN
500.0000 [IU] | Freq: Once | INTRAVENOUS | Status: AC
  Administered 2018-09-17: 500 [IU]
  Filled 2018-09-17: qty 5

## 2018-09-17 NOTE — Telephone Encounter (Signed)
[name] called and said she has been aching (in her bones and back) with no energy and said "I almost feel like I have the flu."  She had to take hydrocodone 3 times yesterday and is taking Advil today.  She is wondering if she can have her blood counts checked today.

## 2018-09-17 NOTE — Telephone Encounter (Signed)
Sure, yes Does she need medication refills? PLease send scehduling msg and we will call her with results. Please also ask if she wants labs through port

## 2018-09-17 NOTE — Telephone Encounter (Signed)
I sent a refill to her pharmacy

## 2018-09-17 NOTE — Telephone Encounter (Signed)
Called [name] and let her know that her lab results were ok per [name].  She verbalized understanding and agreement.

## 2018-09-17 NOTE — Telephone Encounter (Signed)
Called [name] with message below.  She would like her labs drawn from her port.  Message sent to scheduling.  She said she does not need any refills now.  She has 10-15 tablets of hydrocodone left.

## 2018-09-18 LAB — CA 125: Cancer Antigen (CA) 125: 12.6 U/mL (ref 0.0–38.1)

## 2018-09-20 ENCOUNTER — Telehealth: Payer: Self-pay | Admitting: Oncology

## 2018-09-20 NOTE — Telephone Encounter (Signed)
Notified Shalonda of CA 125 result of 12.6 per [name].  She verbalized understanding and agreement.

## 2018-09-24 ENCOUNTER — Inpatient Hospital Stay: Payer: Medicare HMO

## 2018-09-24 ENCOUNTER — Telehealth: Payer: Self-pay | Admitting: Hematology and Oncology

## 2018-09-24 ENCOUNTER — Encounter: Payer: Self-pay | Admitting: Hematology and Oncology

## 2018-09-24 ENCOUNTER — Inpatient Hospital Stay (HOSPITAL_BASED_OUTPATIENT_CLINIC_OR_DEPARTMENT_OTHER): Payer: Medicare HMO | Admitting: Hematology and Oncology

## 2018-09-24 ENCOUNTER — Telehealth: Payer: Self-pay | Admitting: *Deleted

## 2018-09-24 ENCOUNTER — Other Ambulatory Visit: Payer: Medicare HMO

## 2018-09-24 VITALS — BP 138/58 | HR 63 | Temp 97.6°F | Resp 18 | Ht 65.0 in | Wt 176.0 lb

## 2018-09-24 DIAGNOSIS — N281 Cyst of kidney, acquired: Secondary | ICD-10-CM

## 2018-09-24 DIAGNOSIS — C562 Malignant neoplasm of left ovary: Secondary | ICD-10-CM | POA: Diagnosis not present

## 2018-09-24 DIAGNOSIS — R918 Other nonspecific abnormal finding of lung field: Secondary | ICD-10-CM

## 2018-09-24 DIAGNOSIS — F5104 Psychophysiologic insomnia: Secondary | ICD-10-CM

## 2018-09-24 DIAGNOSIS — K5900 Constipation, unspecified: Secondary | ICD-10-CM

## 2018-09-24 DIAGNOSIS — Z79899 Other long term (current) drug therapy: Secondary | ICD-10-CM

## 2018-09-24 DIAGNOSIS — K5909 Other constipation: Secondary | ICD-10-CM

## 2018-09-24 DIAGNOSIS — Z9221 Personal history of antineoplastic chemotherapy: Secondary | ICD-10-CM

## 2018-09-24 DIAGNOSIS — G4700 Insomnia, unspecified: Secondary | ICD-10-CM

## 2018-09-24 DIAGNOSIS — G4701 Insomnia due to medical condition: Secondary | ICD-10-CM

## 2018-09-24 DIAGNOSIS — T451X5A Adverse effect of antineoplastic and immunosuppressive drugs, initial encounter: Secondary | ICD-10-CM

## 2018-09-24 DIAGNOSIS — G8929 Other chronic pain: Secondary | ICD-10-CM

## 2018-09-24 DIAGNOSIS — R5381 Other malaise: Secondary | ICD-10-CM

## 2018-09-24 DIAGNOSIS — G620 Drug-induced polyneuropathy: Secondary | ICD-10-CM | POA: Diagnosis not present

## 2018-09-24 DIAGNOSIS — R609 Edema, unspecified: Secondary | ICD-10-CM

## 2018-09-24 DIAGNOSIS — M549 Dorsalgia, unspecified: Secondary | ICD-10-CM

## 2018-09-24 DIAGNOSIS — Z7984 Long term (current) use of oral hypoglycemic drugs: Secondary | ICD-10-CM

## 2018-09-24 HISTORY — DX: Insomnia, unspecified: G47.00

## 2018-09-24 HISTORY — DX: Other chronic pain: G89.29

## 2018-09-24 LAB — CMP (CANCER CENTER ONLY)
ALT: 27 U/L (ref 0–44)
ANION GAP: 8 (ref 5–15)
AST: 32 U/L (ref 15–41)
Albumin: 4.1 g/dL (ref 3.5–5.0)
Alkaline Phosphatase: 79 U/L (ref 38–126)
BUN: 16 mg/dL (ref 8–23)
CO2: 27 mmol/L (ref 22–32)
Calcium: 9.9 mg/dL (ref 8.9–10.3)
Chloride: 102 mmol/L (ref 98–111)
Creatinine: 0.97 mg/dL (ref 0.44–1.00)
GFR, Est AFR Am: >60 mL/min (ref >60)
GFR, Estimated: 56 mL/min — ABNORMAL LOW (ref >60)
Glucose, Bld: 93 mg/dL (ref 70–99)
Potassium: 4.4 mmol/L (ref 3.5–5.1)
Sodium: 137 mmol/L (ref 135–145)
Total Bilirubin: 0.9 mg/dL (ref 0.3–1.2)
Total Protein: 6.5 g/dL (ref 6.5–8.1)

## 2018-09-24 LAB — CBC WITH DIFFERENTIAL (CANCER CENTER ONLY)
Abs Immature Granulocytes: 0.02 10*3/uL (ref 0.00–0.07)
Basophils Absolute: 0.1 10*3/uL (ref 0.0–0.1)
Basophils Relative: 1 %
Eosinophils Absolute: 0.2 10*3/uL (ref 0.0–0.5)
Eosinophils Relative: 5 %
HCT: 32.5 % — ABNORMAL LOW (ref 36.0–46.0)
Hemoglobin: 10.7 g/dL — ABNORMAL LOW (ref 12.0–15.0)
IMMATURE GRANULOCYTES: 0 %
Lymphocytes Relative: 37 %
Lymphs Abs: 1.7 10*3/uL (ref 0.7–4.0)
MCH: 33.5 pg (ref 26.0–34.0)
MCHC: 32.9 g/dL (ref 30.0–36.0)
MCV: 101.9 fL — ABNORMAL HIGH (ref 80.0–100.0)
Monocytes Absolute: 0.3 10*3/uL (ref 0.1–1.0)
Monocytes Relative: 6 %
Neutro Abs: 2.4 10*3/uL (ref 1.7–7.7)
Neutrophils Relative %: 51 %
Platelet Count: 116 10*3/uL — ABNORMAL LOW (ref 150–400)
RBC: 3.19 MIL/uL — ABNORMAL LOW (ref 3.87–5.11)
RDW: 15.4 % (ref 11.5–15.5)
WBC Count: 4.7 10*3/uL (ref 4.0–10.5)
nRBC: 0 % (ref 0.0–0.2)

## 2018-09-24 MED ORDER — HEPARIN SOD (PORK) LOCK FLUSH 100 UNIT/ML IV SOLN
500.0000 [IU] | Freq: Once | INTRAVENOUS | Status: AC
  Administered 2018-09-24: 500 [IU]
  Filled 2018-09-24: qty 5

## 2018-09-24 MED ORDER — DULOXETINE HCL 20 MG PO CPEP: 20.0000 mg | ORAL_CAPSULE | Freq: Every day | ORAL | 3 refills | Status: DC

## 2018-09-24 MED ORDER — SODIUM CHLORIDE 0.9% FLUSH
10.0000 mL | Freq: Once | INTRAVENOUS | Status: AC
  Administered 2018-09-24: 10 mL
  Filled 2018-09-24: qty 10

## 2018-09-24 NOTE — Telephone Encounter (Signed)
Gave patient avs and calendar.  Helped patient make appt with Dr. Gerarda Fraction for same day.

## 2018-09-24 NOTE — Assessment & Plan Note (Addendum)
So far, she tolerated treatment well without major side effects related to the treatment She is still struggling with neuropathy, chronic back pain and poor sleep Tumor marker is stable I plan to see her back again in a month for further follow-up

## 2018-09-24 NOTE — Assessment & Plan Note (Signed)
She felt that her neuropathy is worse She did not tolerate gabapentin I recommend a trial of Cymbalta I warned her about the risk of Cymbalta including sedation and constipation

## 2018-09-24 NOTE — Telephone Encounter (Signed)
Scheduled appt for the patient to see Dr. Gerarda Fraction.  (per medical oncology scheduler)

## 2018-09-24 NOTE — Assessment & Plan Note (Signed)
She is still having difficulties with mobilizing She felt that physical therapy is helpful.  She will continue the same

## 2018-09-24 NOTE — Assessment & Plan Note (Signed)
She has chronic constipation, stable on laxatives She might have worsening constipation with the addition of Cymbalta.  I encouraged her to increase oral laxatives as needed

## 2018-09-24 NOTE — Assessment & Plan Note (Signed)
She has intermittent flare of chronic back pain She is currently taking pain medicine as needed I recommend consultation with orthopedic surgery to see if repeat injection is necessary or whether that would be helpful There is no contraindication for her to proceed while she is on rucaparib

## 2018-09-24 NOTE — Assessment & Plan Note (Signed)
She has chronic insomnia which is multifactorial Some of her insomnia was exacerbated by her neuropathy pain and chronic back pain As above, I am trying to address some of her issue separately For now, she can continue to take melatonin as needed

## 2018-09-24 NOTE — Progress Notes (Signed)
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) She felt that her neuropathy is worse She did not tolerate gabapentin I recommend a trial of Cymbalta I warned her about the risk of Cymbalta including sedation and constipation  Other constipation She has chronic constipation, stable on laxatives She might have worsening constipation with the addition of Cymbalta.  I encouraged her to increase oral laxatives as needed  Physical debility She is still having difficulties with mobilizing She felt that physical therapy is helpful.  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   INTERVAL HISTORY: Please see below for problem  oriented charting. She returns for further follow-up She complains of multiple different symptoms She complained of mild worsening neuropathy recently She also have acute on chronic back pain She had injection to her spine back in March 2019 and that was helpful.  She is wondering whether she can proceed with repeat injection She has chronic constipation and takes laxatives on a regular basis Denies nausea No recent fever or chills She was successfully treated for urinary tract infection last month The patient denies any recent signs or symptoms of bleeding such as spontaneous epistaxis, hematuria or hematochezia. 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 - TUMOR INVOLVES OVARY SURFACE AND LEFT FALLOPIAN TUBE. - SEE ONCOLOGY TABLE. 2. Mesentery, small bowel mesentery biopsy #1 - BENIGN FIBROADIPOSE TISSUE. 3. Ovary and fallopian tube, right - BENIGN OVARY WITH INCLUSION CYSTS. - BENIGN FALLOPIAN TUBE WITH PARATUBAL CYSTS AND ADENOFIBROMA. 4. Omentum, resection for tumor - BENIGN ADIPOSE TISSUE. 5. Peritoneum, biopsy, left diaphragmatic - BENIGN PERITONEAL TYPE TISSUE. 6. Peritoneum, biopsy, right diaphragmatic - BENIGN PERITONEAL TYPE TISSUE. 7. Mesentery, small bowel mesenteric biopsy #2 - BENIGN FIBROADIPOSE TISSUE. 8. Lymph  nodes, regional resection, right pelvic - FIVE OF FIVE LYMPH NODES NEGATIVE FOR CARCINOMA (0/5). 9. Lymph nodes, regional resection, left pelvic - FOUR OF FOUR LYMPH NODES NEGATIVE FOR  CARCINOMA (0/4). 10. Peritoneum, biopsy, left gutter - BENIGN PERITONEAL TYPE TISSUE. 11. Peritoneum, biopsy, bladder - BENIGN PERITONEAL TYPE TISSUE WITH ACUTE INFLAMMATION AND CALCIFICATIONS.  12. Peritoneum, biopsy, cul-de-sac - BENIGN PERITONEAL TYPE TISSUE WITH ACUTE INFLAMMATION AND CALCIFICATIONS. 13. Soft tissue, biopsy, right gutter - BENIGN FIBROADIPOSE TISSUE. 14. Lymph node, biopsy, right para-aortic - TWO OF TWO LYMPH NODES NEGATIVE FOR CARCINOMA (0/2). 15. Lymph node, biopsy, left para-aortic - ONE OF ONE LYMPH NODES NEGATIVE FOR CARCINOMA (0/1). Microscopic Comment 1. OVARY Specimen(s): Left ovary and fallopian tube. Procedure: (including lymph node sampling): Bilateral salpingo-oophorectomy with omental and peritoneal biopsies and lymph node biopsies. Primary tumor site (including laterality): Left ovary. Ovarian surface involvement: Present. Ovarian capsule intact without fragmentation: Intact. Maximum tumor size (cm): 11 cm total. Histologic type: Serous cystadenocarcinoma. Grade: High grade (low grade areas also present). Peritoneal implants: (specify invasive or non-invasive): N/A. Pelvic extension (list additional structures on separate lines and if involved): Left fallopian tube. Lymph nodes: number examined 12 ; number positive 0 TNM code: pT2a, pN0, pMX FIGO Stage (based on pathologic findings, needs clinical correlation): IIA Comments: None.    01/31/2018 Pathology Results    PERITONEAL WASHING (SPECIMEN 1 OF 1 COLLECTED 01/31/18): MALIGNANT CELLS PRESENT CONSISTENT WITH METASTATIC CARCINOMA.    01/31/2018 Surgery    Procedure(s) Performed:  1. Robotic BSO and washings. 2. Exploratory laparotomy, Staging including infragastric omentectomy, Bilateral pelvic and paraaortic lymphadenectomy, pertioneal biopsies.  Specimens: Bilateral tubes / ovaries, bilateral pelvic and paraaortic lymph nodes, peritoneal biopsies, washings and omentum.  Operative  Findings: The left adnexa was adherent to the left pelvic peritoneum suspected due to the inferior retraction from the vaginal hysterectomy. Intraoperative leakage of cystic fluid from left ovary.  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  The right adnexa grossly appeared normal although slightly enlarged for her age.  There were some indurated lymph nodes but no overtly malignant lymph nodes were suspected.  Small bowel mesentery had 3-4 superficial possible implants.  1 of these was sent for frozen section returned benign.  She did have adhesive disease from her open cholecystectomy in the right upper quadrant.  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 16 mm exophytic lesion upper pole right kidney has attenuation too high to be a simple cyst. This may be a cyst complicated by proteinaceous debris or hemorrhage, but renal cell carcinoma is a concern. Routine outpatient follow-up MRI of the abdomen without and with contrast recommended after resolution of patient's acute symptoms (so she is better able to participate with positioning and breath holding). 2. Mild edema/possible minimal hemorrhage in the extraperitoneal soft tissues of the pelvic for tracking up both pelvic sidewalls. Imaging appearance is not outside of the spectrum of findings expected 6 days after the reported surgery. Trace interloop mesenteric and free fluid is identified in the peritoneal cavity  in there is some mild edema in the omentum. There is no organized or rim enhancing  collection to suggest evolving abscess. 3. Gas in the extraperitoneal soft tissues of the left lower quadrant is associated with gas in the subcutaneous fat of the lower left anterior abdominal wall. This is not unexpected on postoperative day 6. No evidence for intraperitoneal free air. 4. No CT features to suggest small bowel obstruction. Oral contrast material has migrated about halfway through the small bowel loops but there is no differential distention of proximal versus distal small bowel. The colon is diffusely distended and fluid-filled proximally, but formed stool is noted in the distal colon. Given apparent slow migration of contrast and fluid-filled small and large bowel loops, a component of ileus is possible.    02/14/2018 Imaging    MR abdomen Benign Bosniak category 2 cyst in upper pole of right kidney, corresponding with lesion seen on previous CT. No evidence of renal neoplasm or other significant abnormality.     02/18/2018 Cancer Staging    Staging form: Ovary, Fallopian Tube, and Primary Peritoneal Carcinoma, AJCC 8th Edition - Pathologic: Stage II (pT2, pN0, cM0) - Signed by Heath Lark, MD on 02/18/2018    02/28/2018 Procedure    Status post right IJ port catheter placement. 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 For last cycle, taxol was omitted    07/18/2018 Imaging    1. No evidence for residual or recurrent tumor within the abdomen or pelvis. There has been interval resolution edema and fluid within the peritoneal cavity. 2. New pulmonary nodule is identified within the right lower lobe measuring 1.1 cm, image 87/4. Consider one of the following in 3 months for both low-risk and high-risk individuals: (a) repeat chest CT, (b)  follow-up PET-CT, or (c) tissue sampling. This recommendation follows the consensus statement: Guidelines for Management of Incidental Pulmonary Nodules Detected on CT Images: From the Fleischner Society 2017; Radiology 2017; 284:228-243. 3. Stable 7 mm nodule within the right upper lobe. 4. Aortic Atherosclerosis (ICD10-I70.0). 5. Multi vessel coronary artery atherosclerotic calcifications.    07/18/2018 Tumor Marker    Patient's tumor was tested for the following markers: CA-125 Results of the tumor marker test revealed 14.9    07/25/2018 PET scan    1. Regressing right lower lobe peripheral lung lesion, likely resolving inflammatory or atelectatic process. No hypermetabolism to suggest malignancy. I would recommend a follow-up noncontrast chest CT and 4-6 months. Small upper lobe pulmonary nodules are stable. 2. 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    07/31/2018 -  Chemotherapy    The patient started taking rucaparib     REVIEW OF SYSTEMS:   Constitutional: Denies fevers, chills or abnormal weight loss Eyes: Denies blurriness of vision Ears, nose, mouth, throat, and face: Denies mucositis or sore throat Respiratory: Denies cough, dyspnea or wheezes Cardiovascular: Denies palpitation, chest discomfort or lower extremity swelling Skin: Denies abnormal skin rashes Lymphatics: Denies new lymphadenopathy or easy bruising Behavioral/Psych: Mood is stable, no new changes  All other systems were reviewed with the patient and are negative.  I have reviewed the past medical history, past surgical history,  social history and family history with the patient and they are unchanged from previous note.  ALLERGIES:  is allergic to penicillins.  MEDICATIONS:  Current Outpatient Medications  Medication Sig Dispense Refill  . atorvastatin (LIPITOR) 20 MG tablet Take 20 mg by mouth every  evening.     . Cholecalciferol (VITAMIN D) 2000 units tablet Take 2,000 Units by mouth daily after breakfast.     . Cyanocobalamin (B-12 COMPLIANCE INJECTION) 1000 MCG/ML KIT Inject 1,000 mcg as directed every 30 (thirty) days.    Marland Kitchen docusate sodium (COLACE) 100 MG capsule Take 200 mg by mouth daily as needed (severe constipation).    . DULoxetine (CYMBALTA) 20 MG capsule Take 1 capsule (20 mg total) by mouth daily. 30 capsule 3  . FLUoxetine (PROZAC) 20 MG capsule Take 20 mg by mouth daily after breakfast.    . hydrochlorothiazide (HYDRODIURIL) 25 MG tablet Take 25 mg by mouth daily.    Marland Kitchen HYDROcodone-acetaminophen (NORCO/VICODIN) 5-325 MG tablet Take 1 tablet by mouth every 6 (six) hours as needed for moderate pain. 60 tablet 0  . levothyroxine (SYNTHROID, LEVOTHROID) 100 MCG tablet Take 100 mcg by mouth daily before breakfast.    . lidocaine-prilocaine (EMLA) cream Apply to affected area once 30 g 3  . losartan (COZAAR) 100 MG tablet Take 100 mg by mouth at bedtime.     . Melatonin 5 MG TABS Take 5-10 mg by mouth at bedtime as needed (sleep).     . metFORMIN (GLUCOPHAGE-XR) 500 MG 24 hr tablet Take 1,000 mg by mouth 2 (two) times daily.    . naproxen (NAPROSYN) 250 MG tablet Take 1 tablet (250 mg total) by mouth 2 (two) times daily with a meal. (Patient taking differently: Take 250 mg by mouth 2 (two) times daily as needed for mild pain or moderate pain. ) 60 tablet 2  . omeprazole (PRILOSEC OTC) 20 MG tablet Take 20 mg by mouth daily.    . ondansetron (ZOFRAN) 4 MG tablet Take 1 tablet (4 mg total) by mouth every 8 (eight) hours as needed for nausea or vomiting. (Patient not taking: Reported on 07/14/2018) 20 tablet 0  . ondansetron (ZOFRAN) 8 MG tablet Take 1 tablet (8 mg total) by mouth 2 (two) times daily as needed for refractory nausea / vomiting. Start on day 3 after chemo. (Patient not taking: Reported on 07/19/2018) 30 tablet 1  . prochlorperazine (COMPAZINE) 10 MG tablet Take 1 tablet (10 mg  total) by mouth every 6 (six) hours as needed (Nausea or vomiting). (Patient not taking: Reported on 07/19/2018) 30 tablet 1  . RUBRACA 300 MG tablet TAKE 2 TABLETS (600 MG TOTAL) BY MOUTH 2 TIMES DAILY. 120 tablet 0   No current facility-administered medications for this visit.     PHYSICAL EXAMINATION: ECOG PERFORMANCE STATUS: 1 - Symptomatic but completely ambulatory  Vitals:   09/24/18 1311  BP: (!) 138/58  Pulse: 63  Resp: 18  Temp: 97.6 F (36.4 C)  SpO2: 100%   Filed Weights   09/24/18 1311  Weight: 176 lb (79.8 kg)    GENERAL:alert, no distress and comfortable SKIN: skin color, texture, turgor are normal, no rashes or significant lesions EYES: normal, Conjunctiva are pink and non-injected, sclera clear OROPHARYNX:no exudate, no erythema and lips, buccal mucosa, and tongue normal  NECK: supple, thyroid normal size, non-tender, without nodularity LYMPH:  no palpable lymphadenopathy in the cervical, axillary or inguinal LUNGS: clear to auscultation and percussion with normal breathing effort HEART: regular rate &  rhythm and no murmurs and no lower extremity edema ABDOMEN:abdomen soft, non-tender and normal bowel sounds Musculoskeletal:no cyanosis of digits and no clubbing  NEURO: alert & oriented x 3 with fluent speech, no focal motor/sensory deficits  LABORATORY DATA:  I have reviewed the data as listed    Component Value Date/Time   NA 137 09/24/2018 1225   K 4.4 09/24/2018 1225   CL 102 09/24/2018 1225   CO2 27 09/24/2018 1225   GLUCOSE 93 09/24/2018 1225   BUN 16 09/24/2018 1225   CREATININE 0.97 09/24/2018 1225   CALCIUM 9.9 09/24/2018 1225   PROT 6.5 09/24/2018 1225   ALBUMIN 4.1 09/24/2018 1225   AST 32 09/24/2018 1225   ALT 27 09/24/2018 1225   ALKPHOS 79 09/24/2018 1225   BILITOT 0.9 09/24/2018 1225   GFRNONAA 56 (L) 09/24/2018 1225   GFRAA >60 09/24/2018 1225    No results found for: SPEP, UPEP  Lab Results  Component Value Date   WBC 4.7  09/24/2018   NEUTROABS 2.4 09/24/2018   HGB 10.7 (L) 09/24/2018   HCT 32.5 (L) 09/24/2018   MCV 101.9 (H) 09/24/2018   PLT 116 (L) 09/24/2018      Chemistry      Component Value Date/Time   NA 137 09/24/2018 1225   K 4.4 09/24/2018 1225   CL 102 09/24/2018 1225   CO2 27 09/24/2018 1225   BUN 16 09/24/2018 1225   CREATININE 0.97 09/24/2018 1225      Component Value Date/Time   CALCIUM 9.9 09/24/2018 1225   ALKPHOS 79 09/24/2018 1225   AST 32 09/24/2018 1225   ALT 27 09/24/2018 1225   BILITOT 0.9 09/24/2018 1225       RADIOGRAPHIC STUDIES: I have reviewed her last PET CT imaging with the patient I have personally reviewed the radiological images as listed and agreed with the findings in the report.  All questions were answered. The patient knows to call the clinic with any problems, questions or concerns. No barriers to learning was detected.  I spent 25 minutes counseling the patient face to face. The total time spent in the appointment was 30 minutes and more than 50% was on counseling and review of test results  Heath Lark, MD 09/24/2018 2:14 PM

## 2018-09-24 NOTE — Assessment & Plan Note (Signed)
She is not symptomatic.  Last imaging studies showed no evidence of progression, likely represent resolved infection I plan to repeat imaging study in 3 months

## 2018-09-26 ENCOUNTER — Telehealth: Payer: Self-pay | Admitting: Oncology

## 2018-09-26 NOTE — Telephone Encounter (Signed)
[name] called and asked what her CA 125 was from this week.  Advised her it was not drawn this week.  The last result was from 09/17/18.  She verbalized agreement.

## 2018-09-27 ENCOUNTER — Other Ambulatory Visit: Payer: Self-pay | Admitting: Hematology and Oncology

## 2018-09-27 DIAGNOSIS — C562 Malignant neoplasm of left ovary: Secondary | ICD-10-CM

## 2018-09-30 ENCOUNTER — Other Ambulatory Visit: Payer: Self-pay

## 2018-09-30 DIAGNOSIS — C562 Malignant neoplasm of left ovary: Secondary | ICD-10-CM

## 2018-09-30 MED ORDER — RUCAPARIB CAMSYLATE 300 MG PO TABS: ORAL_TABLET | ORAL | 0 refills | Status: DC

## 2018-10-07 MED FILL — RUBRACA 300 MG TAB: 300 | 30 days supply | Qty: 120 | Fill #0

## 2018-10-10 ENCOUNTER — Encounter: Payer: Self-pay | Admitting: Oncology

## 2018-10-10 ENCOUNTER — Inpatient Hospital Stay: Payer: Medicare HMO

## 2018-10-10 ENCOUNTER — Telehealth: Payer: Self-pay | Admitting: Oncology

## 2018-10-10 DIAGNOSIS — C562 Malignant neoplasm of left ovary: Secondary | ICD-10-CM | POA: Diagnosis not present

## 2018-10-10 LAB — CBC WITH DIFFERENTIAL (CANCER CENTER ONLY)
Abs Immature Granulocytes: 0.03 10*3/uL (ref 0.00–0.07)
Basophils Absolute: 0.1 10*3/uL (ref 0.0–0.1)
Basophils Relative: 1 %
Eosinophils Absolute: 0.2 10*3/uL (ref 0.0–0.5)
Eosinophils Relative: 3 %
HCT: 33.4 % — ABNORMAL LOW (ref 36.0–46.0)
Hemoglobin: 10.9 g/dL — ABNORMAL LOW (ref 12.0–15.0)
Immature Granulocytes: 1 %
Lymphocytes Relative: 31 %
Lymphs Abs: 1.8 10*3/uL (ref 0.7–4.0)
MCH: 34.3 pg — AB (ref 26.0–34.0)
MCHC: 32.6 g/dL (ref 30.0–36.0)
MCV: 105 fL — ABNORMAL HIGH (ref 80.0–100.0)
MONO ABS: 0.4 10*3/uL (ref 0.1–1.0)
MONOS PCT: 6 %
Neutro Abs: 3.5 10*3/uL (ref 1.7–7.7)
Neutrophils Relative %: 58 %
Platelet Count: 161 10*3/uL (ref 150–400)
RBC: 3.18 MIL/uL — AB (ref 3.87–5.11)
RDW: 17.2 % — ABNORMAL HIGH (ref 11.5–15.5)
WBC Count: 5.9 10*3/uL (ref 4.0–10.5)
nRBC: 0 % (ref 0.0–0.2)

## 2018-10-10 LAB — CMP (CANCER CENTER ONLY)
ALT: 43 U/L (ref 0–44)
AST: 31 U/L (ref 15–41)
Albumin: 4.2 g/dL (ref 3.5–5.0)
Alkaline Phosphatase: 88 U/L (ref 38–126)
Anion gap: 8 (ref 5–15)
BUN: 24 mg/dL — ABNORMAL HIGH (ref 8–23)
CO2: 26 mmol/L (ref 22–32)
Calcium: 9.9 mg/dL (ref 8.9–10.3)
Chloride: 105 mmol/L (ref 98–111)
Creatinine: 1.09 mg/dL — ABNORMAL HIGH (ref 0.44–1.00)
GFR, Est AFR Am: 57 mL/min — ABNORMAL LOW (ref >60)
GFR, Estimated: 49 mL/min — ABNORMAL LOW (ref >60)
GLUCOSE: 93 mg/dL (ref 70–99)
Potassium: 4.4 mmol/L (ref 3.5–5.1)
Sodium: 139 mmol/L (ref 135–145)
Total Bilirubin: 0.7 mg/dL (ref 0.3–1.2)
Total Protein: 6.9 g/dL (ref 6.5–8.1)

## 2018-10-10 NOTE — Telephone Encounter (Signed)
OK to get labs checked. Would be fastest if we do it from her arm. However, she wants labs/flush, please call to see if flush is available

## 2018-10-10 NOTE — Telephone Encounter (Signed)
[name] called and said she is very tired.  She is wondering if she needs to have her hgb checked again.  She also mentioned that when she goes to stand up, she feels dizzy for a second, then is fine.  She denies having any falls and checks her bp regularly and the last reading was 108/68.  She is going to rehab today and can stop by after to have her labs drawn.

## 2018-10-10 NOTE — Telephone Encounter (Signed)
[name] called and said orthostatic BP was checked at rehab - 122/56 sitting, 122/46 standing.  [name] notified.  Called [name] back and notified her that she can get her labs checked today from her arm.  Apt scheduled for 12:45. Advised her that we will call with the results as soon as they are back.

## 2018-10-10 NOTE — Progress Notes (Signed)
[name] would like her bp checked again - 119/61, hr 56.  She was notified of CBC results per [name].

## 2018-10-11 ENCOUNTER — Telehealth: Payer: Self-pay | Admitting: Oncology

## 2018-10-11 NOTE — Telephone Encounter (Signed)
Yes, ok to take gabapentin

## 2018-10-11 NOTE — Telephone Encounter (Signed)
Called [name] and notified her that is ok to take the gabapentin.  She verbalized understanding.

## 2018-10-11 NOTE — Telephone Encounter (Signed)
Called [name] and notified her that her CMP from yesterday was good per [name].  She verbalized agreement and said she is feeling better today.  She then said she was thinking of stopping Cymbalta because she thinks it causing her to have "weak spells" and making her BP low.  She said the weak spells started a week after she started taking the Cymbalta.  She wants to take gabapentin instead and wants to make sure [name] is OK with this.  She also said her orthopedic doctor suggested taking the gabapentin one tablet in the morning and 2 tablets at night to avoid balance issues during the day.

## 2018-10-15 ENCOUNTER — Telehealth: Payer: Self-pay | Admitting: Oncology

## 2018-10-15 NOTE — Telephone Encounter (Signed)
Called [name] and notified her of message below.

## 2018-10-15 NOTE — Telephone Encounter (Signed)
Yes, ok to proceed.

## 2018-10-15 NOTE — Telephone Encounter (Signed)
[name] called and asked if it is OK for her to have her teeth cleaned.  She has an appointment on 10/22/18 and also mentioned that she has to take 4 tablets of clindamycin before her appointment because she has a knee replacement.

## 2018-10-17 ENCOUNTER — Other Ambulatory Visit: Payer: Self-pay | Admitting: Hematology and Oncology

## 2018-10-17 DIAGNOSIS — C562 Malignant neoplasm of left ovary: Secondary | ICD-10-CM

## 2018-10-25 ENCOUNTER — Encounter: Payer: Self-pay | Admitting: Obstetrics

## 2018-10-25 ENCOUNTER — Inpatient Hospital Stay (HOSPITAL_BASED_OUTPATIENT_CLINIC_OR_DEPARTMENT_OTHER): Payer: Medicare HMO | Admitting: Obstetrics

## 2018-10-25 ENCOUNTER — Encounter: Payer: Self-pay | Admitting: Hematology and Oncology

## 2018-10-25 ENCOUNTER — Inpatient Hospital Stay: Payer: Medicare HMO | Admitting: Hematology and Oncology

## 2018-10-25 ENCOUNTER — Inpatient Hospital Stay: Payer: Medicare HMO | Attending: Gynecologic Oncology

## 2018-10-25 ENCOUNTER — Inpatient Hospital Stay: Payer: Medicare HMO

## 2018-10-25 VITALS — BP 130/59 | HR 58 | Temp 98.1°F | Resp 17 | Ht 65.0 in | Wt 172.0 lb

## 2018-10-25 VITALS — BP 130/59 | HR 58 | Temp 98.1°F | Resp 17 | Ht 65.0 in | Wt 172.6 lb

## 2018-10-25 DIAGNOSIS — M549 Dorsalgia, unspecified: Secondary | ICD-10-CM | POA: Insufficient documentation

## 2018-10-25 DIAGNOSIS — R918 Other nonspecific abnormal finding of lung field: Secondary | ICD-10-CM | POA: Diagnosis not present

## 2018-10-25 DIAGNOSIS — D61818 Other pancytopenia: Secondary | ICD-10-CM | POA: Insufficient documentation

## 2018-10-25 DIAGNOSIS — C562 Malignant neoplasm of left ovary: Secondary | ICD-10-CM | POA: Diagnosis present

## 2018-10-25 DIAGNOSIS — Z79899 Other long term (current) drug therapy: Secondary | ICD-10-CM | POA: Insufficient documentation

## 2018-10-25 DIAGNOSIS — K5909 Other constipation: Secondary | ICD-10-CM | POA: Insufficient documentation

## 2018-10-25 DIAGNOSIS — G8929 Other chronic pain: Secondary | ICD-10-CM | POA: Insufficient documentation

## 2018-10-25 DIAGNOSIS — Z90722 Acquired absence of ovaries, bilateral: Secondary | ICD-10-CM | POA: Insufficient documentation

## 2018-10-25 DIAGNOSIS — Z90710 Acquired absence of both cervix and uterus: Secondary | ICD-10-CM

## 2018-10-25 DIAGNOSIS — Z9221 Personal history of antineoplastic chemotherapy: Secondary | ICD-10-CM | POA: Diagnosis not present

## 2018-10-25 HISTORY — DX: Other pancytopenia: D61.818

## 2018-10-25 LAB — CMP (CANCER CENTER ONLY)
ALT: 43 U/L (ref 0–44)
AST: 42 U/L — ABNORMAL HIGH (ref 15–41)
Albumin: 4.1 g/dL (ref 3.5–5.0)
Alkaline Phosphatase: 69 U/L (ref 38–126)
Anion gap: 9 (ref 5–15)
BUN: 10 mg/dL (ref 8–23)
CHLORIDE: 107 mmol/L (ref 98–111)
CO2: 24 mmol/L (ref 22–32)
Calcium: 9.3 mg/dL (ref 8.9–10.3)
Creatinine: 0.82 mg/dL (ref 0.44–1.00)
GFR, Est AFR Am: >60 mL/min (ref >60)
GFR, Estimated: >60 mL/min (ref >60)
Glucose, Bld: 83 mg/dL (ref 70–99)
Potassium: 4.1 mmol/L (ref 3.5–5.1)
Sodium: 140 mmol/L (ref 135–145)
Total Bilirubin: 0.9 mg/dL (ref 0.3–1.2)
Total Protein: 6.6 g/dL (ref 6.5–8.1)

## 2018-10-25 LAB — CBC WITH DIFFERENTIAL (CANCER CENTER ONLY)
Abs Immature Granulocytes: 0.01 10*3/uL (ref 0.00–0.07)
Basophils Absolute: 0.1 10*3/uL (ref 0.0–0.1)
Basophils Relative: 2 %
Eosinophils Absolute: 0.1 10*3/uL (ref 0.0–0.5)
Eosinophils Relative: 2 %
HCT: 30.7 % — ABNORMAL LOW (ref 36.0–46.0)
HEMOGLOBIN: 10.3 g/dL — AB (ref 12.0–15.0)
Immature Granulocytes: 0 %
Lymphocytes Relative: 51 %
Lymphs Abs: 1.6 10*3/uL (ref 0.7–4.0)
MCH: 35.5 pg — ABNORMAL HIGH (ref 26.0–34.0)
MCHC: 33.6 g/dL (ref 30.0–36.0)
MCV: 105.9 fL — ABNORMAL HIGH (ref 80.0–100.0)
Monocytes Absolute: 0.1 10*3/uL (ref 0.1–1.0)
Monocytes Relative: 4 %
Neutro Abs: 1.3 10*3/uL — ABNORMAL LOW (ref 1.7–7.7)
Neutrophils Relative %: 41 %
Platelet Count: 128 10*3/uL — ABNORMAL LOW (ref 150–400)
RBC: 2.9 MIL/uL — ABNORMAL LOW (ref 3.87–5.11)
RDW: 17.9 % — ABNORMAL HIGH (ref 11.5–15.5)
WBC Count: 3.2 10*3/uL — ABNORMAL LOW (ref 4.0–10.5)
nRBC: 0 % (ref 0.0–0.2)

## 2018-10-25 MED ORDER — HEPARIN SOD (PORK) LOCK FLUSH 100 UNIT/ML IV SOLN
250.0000 [IU] | Freq: Once | INTRAVENOUS | Status: AC
  Administered 2018-10-25: 250 [IU]
  Filled 2018-10-25: qty 5

## 2018-10-25 MED ORDER — SODIUM CHLORIDE 0.9% FLUSH
10.0000 mL | Freq: Once | INTRAVENOUS | Status: AC
  Administered 2018-10-25: 10 mL
  Filled 2018-10-25: qty 10

## 2018-10-25 MED ORDER — HYDROCODONE-ACETAMINOPHEN 5-325 MG PO TABS: 1.0000 | ORAL_TABLET | Freq: Four times a day (QID) | ORAL | 0 refills | Status: DC | PRN

## 2018-10-25 NOTE — Assessment & Plan Note (Signed)
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

## 2018-10-25 NOTE — Progress Notes (Signed)
[place] at [facility]   Progress Note : Established Patient FOLLOW-UP  Consult was initally requested by Dr. Matthew Saras   CC:  Chief Complaint  Patient presents with  . Malignant neoplasm of left ovary [facility])    HPI: [name] is a 78 y.o.    Interval History PET imaging was done and the overall sense was the pulmonary lesion was not disease.  She was started on a PARP (Rucaparib) inhibitor 08/2018. Some dose adjustments/hold but overall tolerating well.  She has been noticing occasional urinary incontinence episodes. Has happened with urgency and sounds like some episodes of stress incontinence. Has also happened to her while asleep. During the day she is usually able to go without leaking. Still having constipation.   Presenting History She was referred to [place] with an 8 cm complex pelvic mass and normal CA 125 of 11 on 01/09/18, no family history of a second-degree relative with breast cancer in first-degree relative with bladder cancer.  She has a history of hysterectomy in 1984.  Recommendations were for bilateral salpingo-oophorectomy with staging and debulking based on operative findings.  On her first visit with [place], Dr. Skeet Latch reviewed, the benefits of a minimally invasive approach which include short hospitalization with her return to function.  She is aware that if there is intraoperative adhesive disease or other intraoperative processes that may this procedure safe for by a laparotomy that would be performed.  She was counseled for robotic assisted bilateral salpingo-oophorectomy possible omentectomy lymph node dissection biopsies debulking and other indicated procedures including laparotomy.  Risks of the procedures discussed were infection bleeding damage to surrounding structures prolonged hospitalization and reoperation and DVT.   The surgery was placed on my schedule to help expedite the procedure. She was symptomatic with  increased urinary frequency.  I did take her to surgery. Unfortunately the frozen section revealed malignancy and so a laparotomy, TAH/BSO, staging was ultimately performed 01/31/2018. She did have a readmission for ileus which resolved. Otherwise recovery was fairly unremarkable.  Since her last visit she has completed her 6 cycles of chemotherapy. She was dose reduced on her Taxol from 163m/m2 cycle 1 to 1453mm2 C2-5, then dropped Taxol for Cycle 6 due to intolerance. Chemo start was 03/04/18 and end was 06/18/18.  End of treatment CT Scan was ordered by Dr. GoAlvy Bimlernd there is a new pulmonary lesion in the RLL  Measurement of disease Imaging (CA125 not elevated preop but being followed) 12/2017 = 11 (PreOP) 02/2018 = 13.6 (chemo start) 07/2018 = 14.9 (chemo end) 09/2018 = 12.6  Recent Labs    03/01/18 0939 07/18/18 0938 08/13/18 1245 08/20/18 1051 08/27/18 1035 09/03/18 1010 09/17/18 1033  CAN125 13.6 14.9 13.3 13.1 12.7 13.3 12.6    Radiology  Preop  End of treatment CT and PET No results found.     Current Meds:  Outpatient Encounter Medications as of 10/25/2018  Medication Sig  . atorvastatin (LIPITOR) 20 MG tablet Take 20 mg by mouth every evening.   . Cholecalciferol (VITAMIN D) 2000 units tablet Take 2,000 Units by mouth daily after breakfast.   . Cyanocobalamin (B-12 COMPLIANCE INJECTION) 1000 MCG/ML KIT Inject 1,000 mcg as directed every 30 (thirty) days.  . Marland Kitchenocusate sodium (COLACE) 100 MG capsule Take 200 mg by mouth daily as needed (severe constipation).  . Marland KitchenLUoxetine (PROZAC) 20 MG capsule Take 20 mg by mouth daily after breakfast.  . hydrochlorothiazide (HYDRODIURIL) 25 MG tablet Take 25 mg  by mouth daily.  Marland Kitchen HYDROcodone-acetaminophen (NORCO/VICODIN) 5-325 MG tablet Take 1 tablet by mouth every 6 (six) hours as needed for moderate pain.  Marland Kitchen levothyroxine (SYNTHROID, LEVOTHROID) 100 MCG tablet Take 100 mcg by mouth daily before breakfast.  .  lidocaine-prilocaine (EMLA) cream Apply to affected area once  . losartan (COZAAR) 100 MG tablet Take 100 mg by mouth at bedtime.   . Melatonin 5 MG TABS Take 5-10 mg by mouth at bedtime as needed (sleep).   . metFORMIN (GLUCOPHAGE-XR) 500 MG 24 hr tablet Take 1,000 mg by mouth 2 (two) times daily.  . rucaparib camsylate (RUBRACA) 300 MG tablet TAKE 2 TABLETS (600 MG TOTAL) BY MOUTH 2 TIMES DAILY.  Marland Kitchen ondansetron (ZOFRAN) 4 MG tablet Take 1 tablet (4 mg total) by mouth every 8 (eight) hours as needed for nausea or vomiting. (Patient not taking: Reported on 10/25/2018)  . ondansetron (ZOFRAN) 8 MG tablet Take 1 tablet (8 mg total) by mouth 2 (two) times daily as needed for refractory nausea / vomiting. Start on day 3 after chemo. (Patient not taking: Reported on 07/19/2018)  . prochlorperazine (COMPAZINE) 10 MG tablet Take 1 tablet (10 mg total) by mouth every 6 (six) hours as needed (Nausea or vomiting). (Patient not taking: Reported on 07/19/2018)  . [DISCONTINUED] DULoxetine (CYMBALTA) 20 MG capsule Take 1 capsule (20 mg total) by mouth daily. (Patient not taking: Reported on 10/25/2018)  . [DISCONTINUED] naproxen (NAPROSYN) 250 MG tablet Take 1 tablet (250 mg total) by mouth 2 (two) times daily with a meal. (Patient not taking: Reported on 10/25/2018)  . [DISCONTINUED] omeprazole (PRILOSEC OTC) 20 MG tablet Take 20 mg by mouth daily.   No facility-administered encounter medications on file as of 10/25/2018.     Allergy:  Allergies  Allergen Reactions  . Penicillins Anaphylaxis    Has patient had a PCN reaction causing immediate rash, facial/tongue/throat swelling, SOB or lightheadedness with hypotension: Yes Has patient had a PCN reaction causing severe rash involving mucus membranes or skin necrosis: No Has patient had a PCN reaction that required hospitalization: Yes Has patient had a PCN reaction occurring within the last 10 years: No If all of the above answers are "NO", then may proceed with  Cephalosporin use.     Social Hx:   Social History   Socioeconomic History  . Marital status: Widowed    Spouse name: Not on file  . Number of children: 2  . Years of education: Not on file  . Highest education level: Not on file  Occupational History  . Occupation: retired Glass blower/designer  Social Needs  . Financial resource strain: Not on file  . Food insecurity:    Worry: Not on file    Inability: Not on file  . Transportation needs:    Medical: Not on file    Non-medical: Not on file  Tobacco Use  . Smoking status: Never Smoker  . Smokeless tobacco: Never Used  Substance and Sexual Activity  . Alcohol use: No  . Drug use: No  . Sexual activity: Not on file  Lifestyle  . Physical activity:    Days per week: Not on file    Minutes per session: Not on file  . Stress: Not on file  Relationships  . Social connections:    Talks on phone: Not on file    Gets together: Not on file    Attends religious service: Not on file    Active member of club or organization: Not on  file    Attends meetings of clubs or organizations: Not on file    Relationship status: Not on file  . Intimate partner violence:    Fear of current or ex partner: Not on file    Emotionally abused: Not on file    Physically abused: Not on file    Forced sexual activity: Not on file  Other Topics Concern  . Not on file  Social History Narrative  . Not on file    Past Surgical Hx:  Past Surgical History:  Procedure Laterality Date  . BLADDER SUSPENSION  2002  . BREAST SURGERY     several benign cysts removed; surgeries from [facility]   . CHOLECYSTECTOMY  1988  . EYE SURGERY  2010 amd 2012   cataracts removed bilateral   . IR FLUORO GUIDE PORT INSERTION RIGHT  02/28/2018  . IR US GUIDE VASC ACCESS RIGHT  02/28/2018  . KNEE ARTHROSCOPY Left    l knee x2  . PARATHYROIDECTOMY  2010  . ROBOTIC ASSISTED SALPINGO OOPHERECTOMY Bilateral 01/31/2018   Procedure: XI ROBOTIC ASSISTED BILATERAL SALPINGO  OOPHORECTOMY, STAGING, LAPAROTOMY, PELVIC AND PARA AORTIC LYMPH NODE DISSECTION, OMENTECTOMY;  Surgeon: Isabel Caprice, MD;  Location: [facility];  Service: Gynecology;  Laterality: Bilateral;  . ROTATOR CUFF REPAIR  2005   right   . 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 Arthritis   . Diabetes mellitus without complication ([place])    type 2   . GERD (gastroesophageal reflux disease)   . Hypertension   . Hypothyroidism   . Ovarian cancer ([place]) 01/31/2018  . Pneumonia    its been 4 years   . PONV (postoperative nausea and vomiting)   . Vitamin B 12 deficiency     Past Gynecological History: 2 prior to hysterectomy in 1984 received estrogen therapy from 1984 until 2000 No LMP recorded. Patient has had a hysterectomy.  Family Hx:  Family History  Problem Relation Age of Onset  . Lymphoma Mother 51  . Bladder Cancer Sister 29  . Prostate cancer Brother 47       has surgery right away after dx  . Leukemia Brother        50's  . Breast cancer Other 16       niece, GT 2017 reportedly neg  . Prostate cancer Father 94       found on autopsy at death (36)  . Lung cancer Maternal Aunt   . Esophageal cancer Maternal Uncle   . Other Maternal Grandfather 75       cerebral hemmhorage  . Lung cancer Cousin   . Lung cancer Cousin   . Leukemia Cousin     Review of Systems - Oncology Vitals:  Blood pressure (!) 130/59, pulse (!) 58, temperature 98.1 F (36.7 C), temperature source Oral, resp. rate 17, height 5' 5"  (1.651 m), weight 172 lb (78 kg), SpO2 100 %.  Physical Exam: General :  Well developed, 78 y.o., female in no apparent distress HEENT:  Normocephalic/atraumatic, symmetric, EOMI, eyelids normal Neck:   Supple, no masses.  Lymphatics:  No cervical/  submandibular/ supraclavicular/ infraclavicular/ inguinal adenopathy Respiratory:  Respirations unlabored, no use of accessory muscles CV:   Deferred Breast:  Deferred Musculoskeletal: No CVA  tenderness, normal muscle strength. Abdomen:  Soft, non-tender and nondistended. No evidence of hernia. No masses. Extremities:  No lymphedema, no erythema, non-tender. Skin:   Normal inspection Neuro/Psych:  No focal motor deficit, no abnormal mental status. Normal gait. Normal affect. Alert and oriented to person, place, and time  Pelvic: Vulva: Normal external female genitalia.  Bladder/urethra: Urethral meatus normal in size and location. No lesions or   masses, moderately supported bladder. Some atrophy Speculum exam: no leakage of fluid seen; no lesions Bimanual exam:  Uterus: Surgically absent  Adnexa: No masses. Rectovaginal:  No masses, normal tone.    ASSESSMENT Ovarian cancer  PLAN 1. Pulmonary lesion - per [name] observation for now 2. Continue to follow with medical oncology for PARP therapy 3. RTC 3 months for pelvic as CA125 unreliable and imaging sometimes difficult in picking up pelvic disease.  Face to face time with patient was 15 minutes. Over 50% of this time was spent on counseling and coordination of care.   Isabel Caprice, MD 10/25/2018, 12:24 PM   Cc: Molli Posey, MD Referring OB/Gyn Gilford Rile, MD [facility]on Dun PCP

## 2018-10-25 NOTE — Progress Notes (Signed)
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  Per recommendation by radiologist, will repeat CT imaging next month  Chronic back pain greater than 3 months duration She has benefited greatly from physical therapy I refilled her prescription pain medicine to be used for short-term I recertify her need to continue on physical therapy  Pancytopenia, acquired [facility]) She has mild worsening pancytopenia due to treatment I recommend holding her chemotherapy for 3 days before resume next week I will keep the dose at same for now   Orders Placed This Encounter  Procedures  . CT ABDOMEN PELVIS W CONTRAST    Standing Status:   Future    Standing Expiration Date:   10/26/2019    Order Specific Question:   If indicated for the ordered procedure, I authorize the administration of contrast media per Radiology protocol    Answer:   Yes    Order Specific Question:   Preferred imaging location?    Answer:   [facility]    Order Specific Question:   Radiology Contrast Protocol - do NOT remove file path    Answer:   _0 [email or web address]  . CT CHEST W CONTRAST    Standing Status:   Future    Standing Expiration Date:   10/26/2019    Order Specific Question:   If indicated for the ordered procedure, I authorize the administration of contrast media per Radiology protocol    Answer:   Yes    Order Specific  Question:   Preferred imaging location?    Answer:   [facility]    Order Specific Question:   Radiology Contrast Protocol - do NOT remove file path    Answer:   _1 [email or web address]    INTERVAL HISTORY: Please see below for problem oriented charting. She returns to be seen due to ongoing chemotherapy and follow-up She denies abdominal pain, nausea or bloating She continues to have chronic constipation, stable Denies recent infection, fever or chills She is getting stronger with physical therapy and it has helped with her hip problem significantly The patient denies any recent signs or symptoms of bleeding such as spontaneous epistaxis, hematuria or hematochezia.   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 - TUMOR INVOLVES OVARY SURFACE AND LEFT FALLOPIAN TUBE. - SEE ONCOLOGY TABLE. 2. Mesentery, small bowel mesentery biopsy #1 - BENIGN FIBROADIPOSE TISSUE. 3. Ovary and fallopian tube, right - BENIGN OVARY WITH INCLUSION CYSTS. - BENIGN FALLOPIAN TUBE WITH PARATUBAL CYSTS AND ADENOFIBROMA. 4. Omentum, resection for tumor - BENIGN ADIPOSE TISSUE. 5. Peritoneum, biopsy, left  diaphragmatic - BENIGN PERITONEAL TYPE TISSUE. 6. Peritoneum, biopsy, right diaphragmatic - BENIGN PERITONEAL TYPE TISSUE. 7. Mesentery, small bowel mesenteric biopsy #2 - BENIGN FIBROADIPOSE TISSUE. 8. Lymph nodes, regional resection, right pelvic - FIVE OF FIVE LYMPH NODES NEGATIVE FOR CARCINOMA (0/5). 9. Lymph nodes, regional resection, left pelvic - FOUR OF FOUR LYMPH NODES NEGATIVE FOR CARCINOMA  (0/4). 10. Peritoneum, biopsy, left gutter - BENIGN PERITONEAL TYPE TISSUE. 11. Peritoneum, biopsy, bladder - BENIGN PERITONEAL TYPE TISSUE WITH ACUTE INFLAMMATION AND CALCIFICATIONS.  12. Peritoneum, biopsy, cul-de-sac - BENIGN PERITONEAL TYPE TISSUE WITH ACUTE INFLAMMATION AND CALCIFICATIONS. 13. Soft tissue, biopsy, right gutter - BENIGN FIBROADIPOSE TISSUE. 14. Lymph node, biopsy, right para-aortic - TWO OF TWO LYMPH NODES NEGATIVE FOR CARCINOMA (0/2). 15. Lymph node, biopsy, left para-aortic - ONE OF ONE LYMPH NODES NEGATIVE FOR CARCINOMA (0/1). Microscopic Comment 1. OVARY Specimen(s): Left ovary and fallopian tube. Procedure: (including lymph node sampling): Bilateral salpingo-oophorectomy with omental and peritoneal biopsies and lymph node biopsies. Primary tumor site (including laterality): Left ovary. Ovarian surface involvement: Present. Ovarian capsule intact without fragmentation: Intact. Maximum tumor size (cm): 11 cm total. Histologic type: Serous cystadenocarcinoma. Grade: High grade (low grade areas also present). Peritoneal implants: (specify invasive or non-invasive): N/A. Pelvic extension (list additional structures on separate lines and if involved): Left fallopian tube. Lymph nodes: number examined 12 ; number positive 0 TNM code: pT2a, pN0, pMX FIGO Stage (based on pathologic findings, needs clinical correlation): IIA Comments: None.    01/31/2018 Pathology Results    PERITONEAL WASHING (SPECIMEN 1 OF 1 COLLECTED 01/31/18): MALIGNANT CELLS PRESENT CONSISTENT WITH METASTATIC CARCINOMA.    01/31/2018 Surgery    Procedure(s) Performed:  1. Robotic BSO and washings. 2. Exploratory laparotomy, Staging including infragastric omentectomy, Bilateral pelvic and paraaortic lymphadenectomy, pertioneal biopsies.  Specimens: Bilateral tubes / ovaries, bilateral pelvic and paraaortic lymph nodes, peritoneal biopsies, washings and omentum.  Operative Findings: The  left adnexa was adherent to the left pelvic peritoneum suspected due to the inferior retraction from the vaginal hysterectomy. Intraoperative leakage of cystic fluid from left ovary.  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  The right adnexa grossly appeared normal although slightly enlarged for her age.  There were some indurated lymph nodes but no overtly malignant lymph nodes were suspected.  Small bowel mesentery had 3-4 superficial possible implants.  1 of these was sent for frozen section returned benign.  She did have adhesive disease from her open cholecystectomy in the right upper quadrant.  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 16 mm exophytic lesion upper pole right kidney has attenuation too high to be a simple cyst. This may be a cyst complicated by proteinaceous debris or hemorrhage, but renal cell carcinoma is a concern. Routine outpatient follow-up MRI of the abdomen without and with contrast recommended after resolution of patient's acute symptoms (so she is better able to participate with positioning and breath holding). 2. Mild edema/possible minimal hemorrhage in the extraperitoneal soft tissues of the pelvic for tracking up both pelvic sidewalls.   Imaging appearance is not outside of the spectrum of findings expected 6 days after the reported surgery. Trace interloop mesenteric and free fluid is identified in the peritoneal cavity in there is some mild edema in the omentum. There is no organized or rim enhancing collection to suggest  evolving abscess. 3. Gas in the extraperitoneal soft tissues of the left lower quadrant is associated with gas in the subcutaneous fat of the lower left anterior abdominal wall. This is not unexpected on postoperative day 6. No evidence for intraperitoneal free air. 4. No CT features to suggest small bowel obstruction. Oral contrast material has migrated about halfway through the small bowel loops but there is no differential distention of proximal versus distal small bowel. The colon is diffusely distended and fluid-filled proximally, but formed stool is noted in the distal colon. Given apparent slow migration of contrast and fluid-filled small and large bowel loops, a component of ileus is possible.    02/14/2018 Imaging    MR abdomen Benign Bosniak category 2 cyst in upper pole of right kidney, corresponding with lesion seen on previous CT. No evidence of renal neoplasm or other significant abnormality.     02/18/2018 Cancer Staging    Staging form: Ovary, Fallopian Tube, and Primary Peritoneal Carcinoma, AJCC 8th Edition - Pathologic: Stage II (pT2, pN0, cM0) - Signed by Gorsuch, Ni, MD on 02/18/2018    02/28/2018 Procedure    Status post right IJ port catheter placement. 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 For last cycle, taxol was omitted    07/18/2018 Imaging    1. No evidence for residual or recurrent tumor within the abdomen or pelvis. There has been interval resolution edema and fluid within the peritoneal cavity. 2. New pulmonary nodule is identified within the right lower lobe measuring 1.1 cm, image 87/4. Consider one of the following in 3 months for both low-risk and high-risk individuals: (a) repeat chest CT, (b) follow-up PET-CT, or  (c) tissue sampling. This recommendation follows the consensus statement: Guidelines for Management of Incidental Pulmonary Nodules Detected on CT Images: From the Fleischner Society 2017; Radiology 2017; 284:228-243. 3. Stable 7 mm nodule within the right upper lobe. 4. Aortic Atherosclerosis (ICD10-I70.0). 5. Multi vessel coronary artery atherosclerotic calcifications.    07/18/2018 Tumor Marker    Patient's tumor was tested for the following markers: CA-125 Results of the tumor marker test revealed 14.9    07/25/2018 PET scan    1. Regressing right lower lobe peripheral lung lesion, likely resolving inflammatory or atelectatic process. No hypermetabolism to suggest malignancy. I would recommend a follow-up noncontrast chest CT and 4-6 months. Small upper lobe pulmonary nodules are stable. 2. 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    07/31/2018 -  Chemotherapy    The patient started taking rucaparib     REVIEW OF SYSTEMS:   Constitutional: Denies fevers, chills or abnormal weight loss Eyes: Denies blurriness of vision Ears, nose, mouth, throat, and face: Denies mucositis or sore throat Cardiovascular: Denies palpitation, chest discomfort or lower extremity swelling Skin: Denies abnormal skin rashes Lymphatics: Denies new lymphadenopathy or easy bruising Behavioral/Psych: Mood is stable, no new changes    All other systems were reviewed with the patient and are negative.  I have reviewed the past medical history, past surgical history, social history and family history with the patient and they are unchanged from previous note.  ALLERGIES:  is allergic to penicillins.  MEDICATIONS:  Current Outpatient Medications  Medication Sig Dispense Refill  . atorvastatin (LIPITOR) 20 MG tablet Take 20 mg by mouth every evening.     . Cholecalciferol (VITAMIN D) 2000 units tablet Take 2,000  Units by mouth daily after breakfast.     . Cyanocobalamin (B-12 COMPLIANCE INJECTION) 1000 MCG/ML KIT Inject 1,000 mcg as directed every 30 (thirty) days.    Marland Kitchen docusate sodium (COLACE) 100 MG capsule Take 200 mg by mouth daily as needed (severe constipation).    . DULoxetine (CYMBALTA) 20 MG capsule Take 1 capsule (20 mg total) by mouth daily. 30 capsule 3  . FLUoxetine (PROZAC) 20 MG capsule Take 20 mg by mouth daily after breakfast.    . hydrochlorothiazide (HYDRODIURIL) 25 MG tablet Take 25 mg by mouth daily.    Marland Kitchen HYDROcodone-acetaminophen (NORCO/VICODIN) 5-325 MG tablet Take 1 tablet by mouth every 6 (six) hours as needed for moderate pain. 60 tablet 0  . levothyroxine (SYNTHROID, LEVOTHROID) 100 MCG tablet Take 100 mcg by mouth daily before breakfast.    . lidocaine-prilocaine (EMLA) cream Apply to affected area once 30 g 3  . losartan (COZAAR) 100 MG tablet Take 100 mg by mouth at bedtime.     . Melatonin 5 MG TABS Take 5-10 mg by mouth at bedtime as needed (sleep).     . metFORMIN (GLUCOPHAGE-XR) 500 MG 24 hr tablet Take 1,000 mg by mouth 2 (two) times daily.    . naproxen (NAPROSYN) 250 MG tablet Take 1 tablet (250 mg total) by mouth 2 (two) times daily with a meal. (Patient taking differently: Take 250 mg by mouth 2 (two) times daily as needed for mild pain or moderate pain. ) 60 tablet 2  . omeprazole (PRILOSEC OTC) 20 MG tablet Take 20 mg by mouth daily.    . ondansetron (ZOFRAN) 4 MG tablet Take 1 tablet (4 mg total) by mouth every 8 (eight) hours as needed for nausea or vomiting. (Patient not taking: Reported on 07/14/2018) 20 tablet 0  . ondansetron (ZOFRAN) 8 MG tablet Take 1 tablet (8 mg total) by mouth 2 (two) times daily as needed for refractory nausea / vomiting. Start on day 3 after chemo. (Patient not taking: Reported on 07/19/2018) 30 tablet 1  . prochlorperazine (COMPAZINE) 10 MG tablet Take 1 tablet (10 mg total) by mouth every 6 (six) hours as needed (Nausea or vomiting).  (Patient not taking: Reported on 07/19/2018) 30 tablet 1  . rucaparib camsylate (RUBRACA) 300 MG tablet TAKE 2 TABLETS (600 MG TOTAL) BY MOUTH 2 TIMES DAILY. 120 tablet 0   No current facility-administered medications for this visit.     PHYSICAL EXAMINATION: ECOG PERFORMANCE STATUS: 1 - Symptomatic but completely ambulatory  Vitals:   10/25/18 1118  BP: (!) 130/59  Pulse: (!) 58  Resp: 17  Temp: 98.1 F (36.7 C)  SpO2: 100%   Filed Weights   10/25/18 1118  Weight: 172 lb 9.6 oz (78.3 kg)    GENERAL:alert, no distress and comfortable SKIN: skin color, texture, turgor are normal, no rashes or significant lesions EYES: normal, Conjunctiva are pink and non-injected, sclera clear OROPHARYNX:no exudate, no erythema and lips, buccal mucosa, and tongue normal  NECK: supple, thyroid normal size, non-tender,  without nodularity LYMPH:  no palpable lymphadenopathy in the cervical, axillary or inguinal LUNGS: clear to auscultation and percussion with normal breathing effort HEART: regular rate & rhythm and no murmurs and no lower extremity edema ABDOMEN:abdomen soft, non-tender and normal bowel sounds Musculoskeletal:no cyanosis of digits and no clubbing  NEURO: alert & oriented x 3 with fluent speech, no focal motor/sensory deficits  LABORATORY DATA:  I have reviewed the data as listed    Component Value Date/Time   NA 139 10/10/2018 1242   K 4.4 10/10/2018 1242   CL 105 10/10/2018 1242   CO2 26 10/10/2018 1242   GLUCOSE 93 10/10/2018 1242   BUN 24 (H) 10/10/2018 1242   CREATININE 1.09 (H) 10/10/2018 1242   CALCIUM 9.9 10/10/2018 1242   PROT 6.9 10/10/2018 1242   ALBUMIN 4.2 10/10/2018 1242   AST 31 10/10/2018 1242   ALT 43 10/10/2018 1242   ALKPHOS 88 10/10/2018 1242   BILITOT 0.7 10/10/2018 1242   GFRNONAA 49 (L) 10/10/2018 1242   GFRAA 57 (L) 10/10/2018 1242    No results found for: SPEP, UPEP  Lab Results  Component Value Date   WBC 3.2 (L) 10/25/2018   NEUTROABS  1.3 (L) 10/25/2018   HGB 10.3 (L) 10/25/2018   HCT 30.7 (L) 10/25/2018   MCV 105.9 (H) 10/25/2018   PLT 128 (L) 10/25/2018      Chemistry      Component Value Date/Time   NA 139 10/10/2018 1242   K 4.4 10/10/2018 1242   CL 105 10/10/2018 1242   CO2 26 10/10/2018 1242   BUN 24 (H) 10/10/2018 1242   CREATININE 1.09 (H) 10/10/2018 1242      Component Value Date/Time   CALCIUM 9.9 10/10/2018 1242   ALKPHOS 88 10/10/2018 1242   AST 31 10/10/2018 1242   ALT 43 10/10/2018 1242   BILITOT 0.7 10/10/2018 1242      All questions were answered. The patient knows to call the clinic with any problems, questions or concerns. No barriers to learning was detected.  I spent 20 minutes counseling the patient face to face. The total time spent in the appointment was 30 minutes and more than 50% was on counseling and review of test results  Ni Gorsuch, MD 10/25/2018 11:46 AM  

## 2018-10-25 NOTE — Assessment & Plan Note (Signed)
Currently, she is not symptomatic.  Per recommendation by radiologist, will repeat CT imaging next month

## 2018-10-25 NOTE — Assessment & Plan Note (Signed)
She has benefited greatly from physical therapy I refilled her prescription pain medicine to be used for short-term I recertify her need to continue on physical therapy

## 2018-10-25 NOTE — Assessment & Plan Note (Addendum)
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

## 2018-10-25 NOTE — Patient Instructions (Signed)
Return in 3 months for pelvic

## 2018-10-26 LAB — CA 125: Cancer Antigen (CA) 125: 12.1 U/mL (ref 0.0–38.1)

## 2018-10-28 ENCOUNTER — Telehealth: Payer: Self-pay | Admitting: Oncology

## 2018-10-28 NOTE — Telephone Encounter (Signed)
[name] called and asked about results of CA 125 from Friday.  Advised her that it was lower at 12.1 per [name].  She verbalized agreement.

## 2018-11-04 ENCOUNTER — Telehealth: Payer: Self-pay | Admitting: Oncology

## 2018-11-04 ENCOUNTER — Other Ambulatory Visit: Payer: Self-pay | Admitting: Hematology and Oncology

## 2018-11-04 DIAGNOSIS — N644 Mastodynia: Secondary | ICD-10-CM | POA: Insufficient documentation

## 2018-11-04 DIAGNOSIS — R590 Localized enlarged lymph nodes: Secondary | ICD-10-CM | POA: Insufficient documentation

## 2018-11-04 HISTORY — DX: Localized enlarged lymph nodes: R59.0

## 2018-11-04 HISTORY — DX: Mastodynia: N64.4

## 2018-11-04 NOTE — Telephone Encounter (Signed)
Called [place] back with message from [name].  She would like to have the mammogram/US and said she usually goes to [facility].

## 2018-11-04 NOTE — Telephone Encounter (Signed)
[name] called and said she has had soreness under her arms and the upper side of her breasts that started 4 weeks ago.  She said she thinks it is the lymph nodes under her arms and said they do feel enlarged.  She said the pain is worse when she takes her bra off and moves or bends.  She is wondering if this could be caused by her low blood counts and if she needs to be seen.

## 2018-11-04 NOTE — Telephone Encounter (Signed)
Called [facility] and scheduled US/Mammogram for 11/12/18 at 8:15 am.  Faxed orders to [phone].  Called [place] and notified her of the appointment.  She verbalized understanding.

## 2018-11-04 NOTE — Telephone Encounter (Signed)
I just examined her and did not feel anything If she still have concerns we will have to order mammogram to address her breast pain and [place] of her right arm pit Let me know what she thinks

## 2018-11-05 ENCOUNTER — Other Ambulatory Visit: Payer: Self-pay | Admitting: Hematology and Oncology

## 2018-11-05 ENCOUNTER — Encounter: Payer: Self-pay | Admitting: Hematology and Oncology

## 2018-11-05 DIAGNOSIS — C562 Malignant neoplasm of left ovary: Secondary | ICD-10-CM

## 2018-11-07 ENCOUNTER — Telehealth: Payer: Self-pay

## 2018-11-07 MED FILL — RUBRACA 300 MG TAB: 300 | 30 days supply | Qty: 120 | Fill #0

## 2018-11-07 NOTE — Telephone Encounter (Signed)
Oral Oncology [facility] Encounter  Cancer care grant funds were exhausted, renewal date is 07/26/19. Rubraca copay is around $3000. The grants for her disease state do not offer high award amounts. I was able to secure 2 grants to cover her the next couple months. I spoke to the Amy about this and let her know I would keep an eye on the grants and get another one when needed as long as grant funds were available. The Amy verbalized understanding and great appreciation. The grant information is as follows and has been shared with [employer].   [place] $3,500 BIN: [id number] GRP: [id number] PCN: [id number] ID: [id number]  Amy [name] $3,400 BIN: [id number] GRP: [id number] PCN: PANF ID: [id number]    [name] [name] [name] Phone [phone] Fax [phone]

## 2018-11-08 ENCOUNTER — Telehealth: Payer: Self-pay | Admitting: Oncology

## 2018-11-08 NOTE — Telephone Encounter (Signed)
Called [name] and discussed results of her mammogram/US.  She is relieved about the results but is still feeling tired.

## 2018-11-11 ENCOUNTER — Telehealth: Payer: Self-pay | Admitting: *Deleted

## 2018-11-11 ENCOUNTER — Inpatient Hospital Stay: Payer: Medicare HMO

## 2018-11-11 ENCOUNTER — Telehealth: Payer: Self-pay | Admitting: Oncology

## 2018-11-11 ENCOUNTER — Other Ambulatory Visit: Payer: Self-pay | Admitting: Hematology and Oncology

## 2018-11-11 DIAGNOSIS — N390 Urinary tract infection, site not specified: Secondary | ICD-10-CM

## 2018-11-11 DIAGNOSIS — C562 Malignant neoplasm of left ovary: Secondary | ICD-10-CM | POA: Diagnosis not present

## 2018-11-11 DIAGNOSIS — R5383 Other fatigue: Secondary | ICD-10-CM

## 2018-11-11 HISTORY — DX: Other fatigue: R53.83

## 2018-11-11 LAB — CBC WITH DIFFERENTIAL/PLATELET
Abs Immature Granulocytes: 0.01 10*3/uL (ref 0.00–0.07)
BASOS PCT: 1 %
Basophils Absolute: 0 10*3/uL (ref 0.0–0.1)
Eosinophils Absolute: 0.1 10*3/uL (ref 0.0–0.5)
Eosinophils Relative: 2 %
HCT: 32.1 % — ABNORMAL LOW (ref 36.0–46.0)
Hemoglobin: 10.9 g/dL — ABNORMAL LOW (ref 12.0–15.0)
Immature Granulocytes: 0 %
Lymphocytes Relative: 40 %
Lymphs Abs: 1.7 10*3/uL (ref 0.7–4.0)
MCH: 35.4 pg — ABNORMAL HIGH (ref 26.0–34.0)
MCHC: 34 g/dL (ref 30.0–36.0)
MCV: 104.2 fL — ABNORMAL HIGH (ref 80.0–100.0)
Monocytes Absolute: 0.3 10*3/uL (ref 0.1–1.0)
Monocytes Relative: 6 %
Neutro Abs: 2.2 10*3/uL (ref 1.7–7.7)
Neutrophils Relative %: 51 %
Platelets: 157 10*3/uL (ref 150–400)
RBC: 3.08 MIL/uL — ABNORMAL LOW (ref 3.87–5.11)
RDW: 17.2 % — ABNORMAL HIGH (ref 11.5–15.5)
WBC: 4.4 10*3/uL (ref 4.0–10.5)
nRBC: 0 % (ref 0.0–0.2)

## 2018-11-11 LAB — COMPREHENSIVE METABOLIC PANEL
ALT: 28 U/L (ref 0–44)
ANION GAP: 8 (ref 5–15)
AST: 25 U/L (ref 15–41)
Albumin: 4 g/dL (ref 3.5–5.0)
Alkaline Phosphatase: 67 U/L (ref 38–126)
BUN: 21 mg/dL (ref 8–23)
CO2: 26 mmol/L (ref 22–32)
Calcium: 9.9 mg/dL (ref 8.9–10.3)
Chloride: 105 mmol/L (ref 98–111)
Creatinine, Ser: 1.03 mg/dL — ABNORMAL HIGH (ref 0.44–1.00)
GFR calc Af Amer: >60 mL/min (ref >60)
GFR calc non Af Amer: 52 mL/min — ABNORMAL LOW (ref >60)
Glucose, Bld: 115 mg/dL — ABNORMAL HIGH (ref 70–99)
Potassium: 3.9 mmol/L (ref 3.5–5.1)
Sodium: 139 mmol/L (ref 135–145)
Total Bilirubin: 1.2 mg/dL (ref 0.3–1.2)
Total Protein: 6.6 g/dL (ref 6.5–8.1)

## 2018-11-11 LAB — URINALYSIS, COMPLETE (UACMP) WITH MICROSCOPIC
Bilirubin Urine: NEGATIVE
Glucose, UA: NEGATIVE mg/dL
Hgb urine dipstick: NEGATIVE
Ketones, ur: 5 mg/dL — AB
Nitrite: NEGATIVE
Protein, ur: 30 mg/dL — AB
Specific Gravity, Urine: 1.027 (ref 1.005–1.030)
pH: 5 (ref 5.0–8.0)

## 2018-11-11 LAB — TSH: TSH: 59.803 u[IU]/mL — ABNORMAL HIGH (ref 0.308–3.960)

## 2018-11-11 NOTE — Telephone Encounter (Signed)
Patient advised of message from [name].  She will come in for labs at 10:30 today and would like to [name] on Wednesday.  She will stop taking the [place] today.

## 2018-11-11 NOTE — Telephone Encounter (Signed)
Tell her to stop her chemo pill until she feels better I will order UA and culture as well I cannot see her today but I can see her Wed morning Maybe we can get her in today for labs and I see her Wed morning, 30 mins. Please arrange

## 2018-11-11 NOTE — Telephone Encounter (Signed)
Notified [name] of appointment with [name] on 11/13/18 at 10 am.  She verbalized agreement.

## 2018-11-11 NOTE — Telephone Encounter (Signed)
Received TC from lab. Pt's TSH today was 59.8 Pt scheduled to see [name] on Wednesday 11/13/18

## 2018-11-11 NOTE — Telephone Encounter (Signed)
Hope called and said she doesn't feel good.  She is tired with no energy and now has diarrhea (4-5 times yesterday) and vomited once yesterday.  She is having itching and is taking Claritin. She still has the breast pain and started taking hydrocodone yesterday which helped.  She is worried that her blood counts are low and wonders if she can have them checked today.

## 2018-11-12 ENCOUNTER — Telehealth: Payer: Self-pay | Admitting: Oncology

## 2018-11-12 LAB — URINE CULTURE

## 2018-11-12 NOTE — Telephone Encounter (Signed)
[name] called and asked about her lab results. Advised her that [name] will review the results with her tomorrow.

## 2018-11-13 ENCOUNTER — Inpatient Hospital Stay: Payer: Medicare HMO | Admitting: Hematology and Oncology

## 2018-11-13 ENCOUNTER — Encounter: Payer: Self-pay | Admitting: Hematology and Oncology

## 2018-11-13 ENCOUNTER — Telehealth: Payer: Self-pay | Admitting: Oncology

## 2018-11-13 VITALS — BP 123/61 | HR 66 | Temp 98.4°F | Resp 18 | Ht 65.0 in | Wt 169.6 lb

## 2018-11-13 DIAGNOSIS — C562 Malignant neoplasm of left ovary: Secondary | ICD-10-CM | POA: Diagnosis not present

## 2018-11-13 DIAGNOSIS — R5383 Other fatigue: Secondary | ICD-10-CM

## 2018-11-13 DIAGNOSIS — N644 Mastodynia: Secondary | ICD-10-CM | POA: Diagnosis not present

## 2018-11-13 MED ORDER — LEVOTHYROXINE SODIUM 125 MCG PO TABS: 125.0000 ug | ORAL_TABLET | Freq: Every day | ORAL | 1 refills | Status: DC

## 2018-11-13 NOTE — Assessment & Plan Note (Signed)
She has bilateral breast pain.  Recent diagnostic imaging studies did not reveal any malignancy Examination is benign The patient is reassured

## 2018-11-13 NOTE — Progress Notes (Signed)
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  New prescription is sent to her local pharmacy  Breast pain, right She has bilateral breast pain.  Recent diagnostic imaging studies did not reveal any malignancy Examination is benign The patient is reassured   No orders of the defined types were placed in this encounter.   INTERVAL HISTORY: Please see below for problem oriented charting. She returns for further follow-up to address of breast pain and her excessive symptoms of fatigue and anxiety She denies recent fever or chills.  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 - TUMOR INVOLVES OVARY SURFACE AND LEFT FALLOPIAN TUBE. - SEE ONCOLOGY TABLE. 2. Mesentery, small bowel mesentery biopsy #1 - BENIGN FIBROADIPOSE TISSUE. 3. Ovary and fallopian tube, right - BENIGN OVARY WITH INCLUSION CYSTS. - BENIGN FALLOPIAN TUBE WITH PARATUBAL CYSTS AND ADENOFIBROMA. 4. Omentum, resection for tumor - BENIGN ADIPOSE TISSUE. 5. Peritoneum, biopsy, left diaphragmatic - BENIGN PERITONEAL TYPE TISSUE. 6. Peritoneum, biopsy,  right diaphragmatic - BENIGN PERITONEAL TYPE TISSUE. 7. Mesentery, small bowel mesenteric biopsy #2 - BENIGN FIBROADIPOSE TISSUE. 8. Lymph nodes, regional resection, right pelvic - FIVE OF FIVE LYMPH NODES NEGATIVE FOR CARCINOMA (0/5). 9. Lymph nodes, regional resection, left pelvic - FOUR OF FOUR LYMPH NODES NEGATIVE FOR CARCINOMA (0/4). 10. Peritoneum, biopsy, left gutter - BENIGN PERITONEAL TYPE TISSUE. 11. Peritoneum, biopsy, bladder - BENIGN PERITONEAL TYPE TISSUE WITH ACUTE INFLAMMATION AND CALCIFICATIONS.  12. Peritoneum, biopsy, cul-de-sac - BENIGN PERITONEAL TYPE TISSUE WITH ACUTE INFLAMMATION AND CALCIFICATIONS. 13. Soft tissue, biopsy, right gutter - BENIGN FIBROADIPOSE TISSUE. 14. Lymph node, biopsy, right para-aortic - TWO OF TWO LYMPH NODES NEGATIVE FOR CARCINOMA (0/2). 15. Lymph node, biopsy, left para-aortic - ONE OF ONE LYMPH NODES NEGATIVE FOR CARCINOMA (0/1). Microscopic Comment 1. OVARY Specimen(s): Left ovary and fallopian tube. Procedure: (including lymph node sampling): Bilateral salpingo-oophorectomy with omental and peritoneal biopsies and lymph node biopsies. Primary tumor site (including laterality): Left ovary. Ovarian surface involvement: Present. Ovarian capsule intact without fragmentation: Intact. Maximum tumor size (cm): 11 cm total. Histologic type: Serous cystadenocarcinoma. Grade: High grade (low grade areas also present). Peritoneal implants: (specify invasive or non-invasive): N/A. Pelvic extension (list additional structures on separate lines and if involved): Left fallopian tube. Lymph nodes: number examined 12 ; number positive 0 TNM code: pT2a, pN0, pMX FIGO Stage (based on pathologic findings, needs clinical correlation): IIA Comments: None.    01/31/2018 Pathology Results    PERITONEAL WASHING (SPECIMEN 1 OF 1 COLLECTED 01/31/18): MALIGNANT CELLS PRESENT CONSISTENT WITH METASTATIC CARCINOMA.    01/31/2018 Surgery    Procedure(s)  Performed:  1. Robotic  BSO and washings. 2. Exploratory laparotomy, Staging including infragastric omentectomy, Bilateral pelvic and paraaortic lymphadenectomy, pertioneal biopsies.  Specimens: Bilateral tubes / ovaries, bilateral pelvic and paraaortic lymph nodes, peritoneal biopsies, washings and omentum.  Operative Findings: The left adnexa was adherent to the left pelvic peritoneum suspected due to the inferior retraction from the vaginal hysterectomy. Intraoperative leakage of cystic fluid from left ovary.  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  The right adnexa grossly appeared normal although slightly enlarged for her age.  There were some indurated lymph nodes but no overtly malignant lymph nodes were suspected.  Small bowel mesentery had 3-4 superficial possible implants.  1 of these was sent for frozen section returned benign.  She did have adhesive disease from her open cholecystectomy in the right upper quadrant.  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 16 mm exophytic lesion upper pole right kidney has attenuation too high to be a simple cyst. This may be a cyst complicated by proteinaceous debris or hemorrhage, but renal cell carcinoma is a concern. Routine outpatient follow-up MRI of the abdomen without and with contrast recommended after resolution of patient's acute symptoms (so she is better able to participate with positioning and breath holding). 2. Mild edema/possible minimal hemorrhage in the extraperitoneal soft tissues of  the pelvic for tracking up both pelvic sidewalls. Imaging appearance is not outside of the spectrum of findings expected 6 days after the reported surgery. Trace interloop mesenteric and free fluid is identified in the peritoneal cavity in there is some mild edema in the omentum. There is no organized or rim enhancing collection to suggest evolving abscess. 3. Gas in the extraperitoneal soft tissues of the left lower quadrant is associated with gas in the subcutaneous fat of the lower left anterior abdominal wall. This is not unexpected on postoperative day 6. No evidence for intraperitoneal free air. 4. No CT features to suggest small bowel obstruction. Oral contrast material has migrated about halfway through the small bowel loops but there is no differential distention of proximal versus distal small bowel. The colon is diffusely distended and fluid-filled proximally, but formed stool is noted in the distal colon. Given apparent slow migration of contrast and fluid-filled small and large bowel loops, a component of ileus is possible.    02/14/2018 Imaging    MR abdomen Benign Bosniak category 2 cyst in upper pole of right kidney, corresponding with lesion seen on previous CT. No evidence of renal neoplasm or other significant abnormality.     02/18/2018 Cancer Staging    Staging form: Ovary, Fallopian Tube, and Primary Peritoneal Carcinoma, AJCC 8th Edition - Pathologic: Stage II (pT2, pN0, cM0) - Signed by Heath Lark, MD on 02/18/2018    02/28/2018 Procedure    Status post right IJ port catheter placement. Catheter ready for use    03/01/2018 Tumor Marker    Patient's tumor was tested for the following markers: CA-125 Results of the tumor marker test revealed 13.6    03/04/2018 - 06/18/2018 Chemotherapy  The patient had carboplatin and Taxol x 6 cycles with dose reduction due to neuropathy. For last cycle, taxol was omitted    07/18/2018 Imaging    1. No evidence for residual or recurrent tumor  within the abdomen or pelvis. There has been interval resolution edema and fluid within the peritoneal cavity. 2. New pulmonary nodule is identified within the right lower lobe measuring 1.1 cm, image 87/4. Consider one of the following in 3 months for both low-risk and high-risk individuals: (a) repeat chest CT, (b) follow-up PET-CT, or (c) tissue sampling. This recommendation follows the consensus statement: Guidelines for Management of Incidental Pulmonary Nodules Detected on CT Images: From the Fleischner Society 2017; Radiology 2017; 284:228-243. 3. Stable 7 mm nodule within the right upper lobe. 4. Aortic Atherosclerosis (ICD10-I70.0). 5. Multi vessel coronary artery atherosclerotic calcifications.    07/18/2018 Tumor Marker    Patient's tumor was tested for the following markers: CA-125 Results of the tumor marker test revealed 14.9    07/25/2018 PET scan    1. Regressing right lower lobe peripheral lung lesion, likely resolving inflammatory or atelectatic process. No hypermetabolism to suggest malignancy. I would recommend a follow-up noncontrast chest CT and 4-6 months. Small upper lobe pulmonary nodules are stable. 2. 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    07/31/2018 -  Chemotherapy    The patient started taking rucaparib    10/25/2018 Tumor Marker    Patient's tumor was tested for the following markers: CA-125 Results of the tumor marker test revealed 12.1     REVIEW OF SYSTEMS:   Constitutional: Denies fevers, chills or abnormal weight loss Eyes: Denies blurriness of vision Ears, nose, mouth, throat, and face: Denies mucositis or sore throat Respiratory: Denies cough, dyspnea or wheezes Cardiovascular: Denies palpitation, chest discomfort or lower extremity swelling Gastrointestinal:  Denies nausea, heartburn or change in bowel habits Skin: Denies abnormal skin  rashes Lymphatics: Denies new lymphadenopathy or easy bruising Neurological:Denies numbness, tingling or new weaknesses Behavioral/Psych: Mood is stable, no new changes  All other systems were reviewed with the patient and are negative.  I have reviewed the past medical history, past surgical history, social history and family history with the patient and they are unchanged from previous note.  ALLERGIES:  is allergic to penicillins.  MEDICATIONS:  Current Outpatient Medications  Medication Sig Dispense Refill  . atorvastatin (LIPITOR) 20 MG tablet Take 20 mg by mouth every evening.     . Cholecalciferol (VITAMIN D) 2000 units tablet Take 2,000 Units by mouth daily after breakfast.     . Cyanocobalamin (B-12 COMPLIANCE INJECTION) 1000 MCG/ML KIT Inject 1,000 mcg as directed every 30 (thirty) days.    Marland Kitchen docusate sodium (COLACE) 100 MG capsule Take 200 mg by mouth daily as needed (severe constipation).    Marland Kitchen FLUoxetine (PROZAC) 20 MG capsule Take 20 mg by mouth daily after breakfast.    . hydrochlorothiazide (HYDRODIURIL) 25 MG tablet Take 25 mg by mouth daily.    Marland Kitchen HYDROcodone-acetaminophen (NORCO/VICODIN) 5-325 MG tablet Take 1 tablet by mouth every 6 (six) hours as needed for moderate pain. 60 tablet 0  . levothyroxine (SYNTHROID, LEVOTHROID) 125 MCG tablet Take 1 tablet (125 mcg total) by mouth daily before breakfast. 90 tablet 1  . lidocaine-prilocaine (EMLA) cream Apply to affected area once 30 g 3  . losartan (COZAAR) 100 MG tablet Take 100 mg by mouth at bedtime.     Marland Kitchen  Melatonin 5 MG TABS Take 5-10 mg by mouth at bedtime as needed (sleep).     . metFORMIN (GLUCOPHAGE-XR) 500 MG 24 hr tablet Take 1,000 mg by mouth 2 (two) times daily.    . ondansetron (ZOFRAN) 4 MG tablet Take 1 tablet (4 mg total) by mouth every 8 (eight) hours as needed for nausea or vomiting. (Patient not taking: Reported on 10/25/2018) 20 tablet 0  . ondansetron (ZOFRAN) 8 MG tablet Take 1 tablet (8 mg total) by  mouth 2 (two) times daily as needed for refractory nausea / vomiting. Start on day 3 after chemo. (Patient not taking: Reported on 07/19/2018) 30 tablet 1  . prochlorperazine (COMPAZINE) 10 MG tablet Take 1 tablet (10 mg total) by mouth every 6 (six) hours as needed (Nausea or vomiting). (Patient not taking: Reported on 07/19/2018) 30 tablet 1  . RUBRACA 300 MG tablet TAKE 2 TABLETS (600 MG TOTAL) BY MOUTH 2 TIMES DAILY. 120 tablet 0  . senna (SENOKOT) 8.6 MG tablet Take 1 tablet by mouth daily.     No current facility-administered medications for this visit.     PHYSICAL EXAMINATION: ECOG PERFORMANCE STATUS: 1 - Symptomatic but completely ambulatory  Vitals:   11/13/18 1009  BP: 123/61  Pulse: 66  Resp: 18  Temp: 98.4 F (36.9 C)  SpO2: 100%   Filed Weights   11/13/18 1009  Weight: 169 lb 9.6 oz (76.9 kg)    GENERAL:alert, no distress and comfortable SKIN: skin color, texture, turgor are normal, no rashes or significant lesions EYES: normal, Conjunctiva are pink and non-injected, sclera clear OROPHARYNX:no exudate, no erythema and lips, buccal mucosa, and tongue normal  NECK: supple, thyroid normal size, non-tender, without nodularity LYMPH:  no palpable lymphadenopathy in the cervical, axillary or inguinal LUNGS: clear to auscultation and percussion with normal breathing effort HEART: regular rate & rhythm and no murmurs and no lower extremity edema ABDOMEN:abdomen soft, non-tender and normal bowel sounds Musculoskeletal:no cyanosis of digits and no clubbing  NEURO: alert & oriented x 3 with fluent speech, no focal motor/sensory deficits Bilateral breast examination were performed which revealed no abnormalities  LABORATORY DATA:  I have reviewed the data as listed    Component Value Date/Time   NA 139 11/11/2018 1111   K 3.9 11/11/2018 1111   CL 105 11/11/2018 1111   CO2 26 11/11/2018 1111   GLUCOSE 115 (H) 11/11/2018 1111   BUN 21 11/11/2018 1111   CREATININE 1.03  (H) 11/11/2018 1111   CREATININE 0.82 10/25/2018 1100   CALCIUM 9.9 11/11/2018 1111   PROT 6.6 11/11/2018 1111   ALBUMIN 4.0 11/11/2018 1111   AST 25 11/11/2018 1111   AST 42 (H) 10/25/2018 1100   ALT 28 11/11/2018 1111   ALT 43 10/25/2018 1100   ALKPHOS 67 11/11/2018 1111   BILITOT 1.2 11/11/2018 1111   BILITOT 0.9 10/25/2018 1100   GFRNONAA 52 (L) 11/11/2018 1111   GFRNONAA >60 10/25/2018 1100   GFRAA >60 11/11/2018 1111   GFRAA >60 10/25/2018 1100    No results found for: SPEP, UPEP  Lab Results  Component Value Date   WBC 4.4 11/11/2018   NEUTROABS 2.2 11/11/2018   HGB 10.9 (L) 11/11/2018   HCT 32.1 (L) 11/11/2018   MCV 104.2 (H) 11/11/2018   PLT 157 11/11/2018      Chemistry      Component Value Date/Time   NA 139 11/11/2018 1111   K 3.9 11/11/2018 1111   CL 105 11/11/2018 1111  CO2 26 11/11/2018 1111   BUN 21 11/11/2018 1111   CREATININE 1.03 (H) 11/11/2018 1111   CREATININE 0.82 10/25/2018 1100      Component Value Date/Time   CALCIUM 9.9 11/11/2018 1111   ALKPHOS 67 11/11/2018 1111   AST 25 11/11/2018 1111   AST 42 (H) 10/25/2018 1100   ALT 28 11/11/2018 1111   ALT 43 10/25/2018 1100   BILITOT 1.2 11/11/2018 1111   BILITOT 0.9 10/25/2018 1100      All questions were answered. The patient knows to call the clinic with any problems, questions or concerns. No barriers to learning was detected.  I spent 15 minutes counseling the patient face to face. The total time spent in the appointment was 20 minutes and more than 50% was on counseling and review of test results  Heath Lark, MD 11/13/2018 11:30 AM

## 2018-11-13 NOTE — Assessment & Plan Note (Signed)
The symptoms she is experiencing are not related to the side effects of her chemotherapy She will continue treatment without dose adjustment She is scheduled for CT imaging in 2 weeks

## 2018-11-13 NOTE — Telephone Encounter (Signed)
Wilbur called and said she has already taken a 100 mcg tablet of synthroid today and [name] has sent in a new prescription for 125 mcg. She is wondering if she should take an extra 1/2 tablet (50 mcg) today and then start on the 125 mcg tablets tomorrow.  Advised her to take the 1/2 tablet today and then start the 125 mcg tablets tomorrow per [name].

## 2018-11-13 NOTE — Assessment & Plan Note (Signed)
She has profound hypothyroidism with markedly elevated TSH I recommend increasing levothyroxine to 125 MCG.  New prescription is sent to her local pharmacy

## 2018-11-14 ENCOUNTER — Telehealth: Payer: Self-pay | Admitting: Oncology

## 2018-11-14 ENCOUNTER — Telehealth: Payer: Self-pay | Admitting: Hematology and Oncology

## 2018-11-14 NOTE — Telephone Encounter (Signed)
[name] called and asked if she should continue rehab right now. Her next appointment is Friday.  She said she is so tired and has body aches and doesn't know if it will do any good right now.

## 2018-11-14 NOTE — Telephone Encounter (Signed)
I anticipate the increased dose of thyroid medicine will start to help her within 1 week If she wants to reschedule to next week or the following week that would be fine I suggest she continues on rehab since she has made such good progress

## 2018-11-14 NOTE — Telephone Encounter (Signed)
No new orders

## 2018-11-14 NOTE — Telephone Encounter (Signed)
Called [name] and advised her of message from [name].

## 2018-11-19 ENCOUNTER — Telehealth: Payer: Self-pay | Admitting: Oncology

## 2018-11-19 NOTE — Telephone Encounter (Signed)
I am not aware that biotin causes TSH changes The mild diarrhea could be due to changes in her thyroid medicine or chemo I recommend a trial of reduced fiber intake for a week and see what happens

## 2018-11-19 NOTE — Telephone Encounter (Signed)
[name] called and said she has been taking biotin since October for her hair and nails.  She said her sister was told by [facility] that biotin can effect TSH results.  She is wondering if this is true.  She is going to stop taking it today.  She also said she is not feeling better yet.  She has started having diarrhea 5-6 times per day since she increased her synthroid but does not feel dehydrated.

## 2018-11-25 ENCOUNTER — Inpatient Hospital Stay: Payer: Medicare HMO | Attending: Gynecologic Oncology

## 2018-11-25 ENCOUNTER — Inpatient Hospital Stay: Payer: Medicare HMO

## 2018-11-25 ENCOUNTER — Telehealth: Payer: Self-pay | Admitting: Oncology

## 2018-11-25 ENCOUNTER — Ambulatory Visit (HOSPITAL_COMMUNITY)
Admission: RE | Admit: 2018-11-25 | Discharge: 2018-11-25 | Disposition: A | Discharge status: Home / Self Care | Payer: Medicare HMO | Source: Ambulatory Visit | Attending: Hematology and Oncology | Admitting: Hematology and Oncology

## 2018-11-25 ENCOUNTER — Other Ambulatory Visit: Payer: Self-pay | Admitting: Hematology and Oncology

## 2018-11-25 ENCOUNTER — Telehealth: Payer: Self-pay | Admitting: *Deleted

## 2018-11-25 DIAGNOSIS — C562 Malignant neoplasm of left ovary: Secondary | ICD-10-CM | POA: Diagnosis not present

## 2018-11-25 DIAGNOSIS — E039 Hypothyroidism, unspecified: Secondary | ICD-10-CM | POA: Insufficient documentation

## 2018-11-25 DIAGNOSIS — G620 Drug-induced polyneuropathy: Secondary | ICD-10-CM | POA: Insufficient documentation

## 2018-11-25 DIAGNOSIS — K449 Diaphragmatic hernia without obstruction or gangrene: Secondary | ICD-10-CM | POA: Insufficient documentation

## 2018-11-25 DIAGNOSIS — Z7984 Long term (current) use of oral hypoglycemic drugs: Secondary | ICD-10-CM | POA: Diagnosis not present

## 2018-11-25 DIAGNOSIS — T451X5A Adverse effect of antineoplastic and immunosuppressive drugs, initial encounter: Secondary | ICD-10-CM | POA: Diagnosis not present

## 2018-11-25 DIAGNOSIS — R918 Other nonspecific abnormal finding of lung field: Secondary | ICD-10-CM | POA: Insufficient documentation

## 2018-11-25 DIAGNOSIS — R160 Hepatomegaly, not elsewhere classified: Secondary | ICD-10-CM | POA: Insufficient documentation

## 2018-11-25 DIAGNOSIS — R5381 Other malaise: Secondary | ICD-10-CM | POA: Insufficient documentation

## 2018-11-25 DIAGNOSIS — I700 Atherosclerosis of aorta: Secondary | ICD-10-CM | POA: Diagnosis not present

## 2018-11-25 DIAGNOSIS — Z79899 Other long term (current) drug therapy: Secondary | ICD-10-CM | POA: Insufficient documentation

## 2018-11-25 LAB — CMP (CANCER CENTER ONLY)
ALT: 27 U/L (ref 0–44)
AST: 31 U/L (ref 15–41)
Albumin: 4.5 g/dL (ref 3.5–5.0)
Alkaline Phosphatase: 60 U/L (ref 38–126)
Anion gap: 11 (ref 5–15)
BUN: 14 mg/dL (ref 8–23)
CALCIUM: 10 mg/dL (ref 8.9–10.3)
CO2: 25 mmol/L (ref 22–32)
Chloride: 103 mmol/L (ref 98–111)
Creatinine: 0.9 mg/dL (ref 0.44–1.00)
GFR, Est AFR Am: >60 mL/min (ref >60)
GFR, Estimated: >60 mL/min (ref >60)
Glucose, Bld: 125 mg/dL — ABNORMAL HIGH (ref 70–99)
Potassium: 4.2 mmol/L (ref 3.5–5.1)
Sodium: 139 mmol/L (ref 135–145)
Total Bilirubin: 1.3 mg/dL — ABNORMAL HIGH (ref 0.3–1.2)
Total Protein: 7.2 g/dL (ref 6.5–8.1)

## 2018-11-25 LAB — CBC WITH DIFFERENTIAL (CANCER CENTER ONLY)
Abs Immature Granulocytes: 0.02 10*3/uL (ref 0.00–0.07)
Basophils Absolute: 0.1 10*3/uL (ref 0.0–0.1)
Basophils Relative: 1 %
EOS PCT: 3 %
Eosinophils Absolute: 0.1 10*3/uL (ref 0.0–0.5)
HCT: 34 % — ABNORMAL LOW (ref 36.0–46.0)
HEMOGLOBIN: 11.6 g/dL — AB (ref 12.0–15.0)
Immature Granulocytes: 0 %
Lymphocytes Relative: 32 %
Lymphs Abs: 1.6 10*3/uL (ref 0.7–4.0)
MCH: 35.6 pg — ABNORMAL HIGH (ref 26.0–34.0)
MCHC: 34.1 g/dL (ref 30.0–36.0)
MCV: 104.3 fL — ABNORMAL HIGH (ref 80.0–100.0)
Monocytes Absolute: 0.4 10*3/uL (ref 0.1–1.0)
Monocytes Relative: 8 %
Neutro Abs: 2.8 10*3/uL (ref 1.7–7.7)
Neutrophils Relative %: 56 %
Platelet Count: 153 10*3/uL (ref 150–400)
RBC: 3.26 MIL/uL — ABNORMAL LOW (ref 3.87–5.11)
RDW: 16 % — ABNORMAL HIGH (ref 11.5–15.5)
WBC Count: 4.9 10*3/uL (ref 4.0–10.5)
nRBC: 0 % (ref 0.0–0.2)

## 2018-11-25 LAB — T4, FREE: Free T4: 1 ng/dL (ref 0.82–1.77)

## 2018-11-25 LAB — TSH: TSH: 17.398 u[IU]/mL — ABNORMAL HIGH (ref 0.308–3.960)

## 2018-11-25 MED ORDER — HEPARIN SOD (PORK) LOCK FLUSH 100 UNIT/ML IV SOLN
INTRAVENOUS | Status: AC
  Filled 2018-11-25: qty 5

## 2018-11-25 MED ORDER — IOHEXOL 300 MG/ML  SOLN
100.0000 mL | Freq: Once | INTRAMUSCULAR | Status: AC | PRN
  Administered 2018-11-25: 100 mL via INTRAVENOUS

## 2018-11-25 MED ORDER — SODIUM CHLORIDE 0.9% FLUSH
10.0000 mL | Freq: Once | INTRAVENOUS | Status: AC
  Administered 2018-11-25: 10 mL
  Filled 2018-11-25: qty 10

## 2018-11-25 MED ORDER — SODIUM CHLORIDE (PF) 0.9 % IJ SOLN
INTRAMUSCULAR | Status: AC
  Filled 2018-11-25: qty 50

## 2018-11-25 MED ORDER — HEPARIN SOD (PORK) LOCK FLUSH 100 UNIT/ML IV SOLN
500.0000 [IU] | Freq: Once | INTRAVENOUS | Status: AC
  Administered 2018-11-25: 500 [IU] via INTRAVENOUS

## 2018-11-25 NOTE — Patient Instructions (Signed)

## 2018-11-25 NOTE — Telephone Encounter (Signed)
Tests are ordered I will assess her tomorrow as scheduled I have no opening today

## 2018-11-25 NOTE — Telephone Encounter (Signed)
Patient arrived to the lobby for labs and her CT scan. Patient was tearful and complaining of being "sick". She states she does not have a specific complaint. She feels achy all over, not like arthritis pain but deep in her bones. Patient has been able to eat and drink, at her normal baseline for bowel and bladder function. Patient encouraged to keep CT appt. Labs will be checked again today. Medication reviewed. Per [name] patient may take 2 hydrocodone as needed for her pain every 6 hours. She will hold her Patient will be evaluated tomorrow. If she needs to be sooner patient may be seen in [facility] [address]. Patient verbalized an understanding and agrees with this.

## 2018-11-25 NOTE — Telephone Encounter (Addendum)
Emer called and said she is done with the CT scan.  She had to wait for her blood work to complete so she is wondering if her labs (TSH) are back yet.  Advised her that they are not back yet.  She also mentioned that she will need a refill on hydrocodone-acetaminophen tomorrow.

## 2018-11-25 NOTE — Telephone Encounter (Signed)
Called [name] back and discussed lab results - TSH much improved and CT scan does not show any signs of cancer per [name].  She verbalized agreement and understanding.

## 2018-11-25 NOTE — Telephone Encounter (Signed)
[name] called and said she wants to make sure a TSH is being drawn today with her labs.  She also said she is feeling worse with nausea, fatigue and bone aches.  She said she just feels "sick."

## 2018-11-26 ENCOUNTER — Inpatient Hospital Stay: Payer: Medicare HMO | Admitting: Hematology and Oncology

## 2018-11-26 ENCOUNTER — Encounter: Payer: Self-pay | Admitting: Hematology and Oncology

## 2018-11-26 ENCOUNTER — Telehealth: Payer: Self-pay | Admitting: Hematology and Oncology

## 2018-11-26 DIAGNOSIS — R5381 Other malaise: Secondary | ICD-10-CM | POA: Diagnosis not present

## 2018-11-26 DIAGNOSIS — C562 Malignant neoplasm of left ovary: Secondary | ICD-10-CM

## 2018-11-26 DIAGNOSIS — E039 Hypothyroidism, unspecified: Secondary | ICD-10-CM | POA: Diagnosis not present

## 2018-11-26 DIAGNOSIS — Z7189 Other specified counseling: Secondary | ICD-10-CM

## 2018-11-26 DIAGNOSIS — I700 Atherosclerosis of aorta: Secondary | ICD-10-CM

## 2018-11-26 DIAGNOSIS — K449 Diaphragmatic hernia without obstruction or gangrene: Secondary | ICD-10-CM

## 2018-11-26 DIAGNOSIS — Z79899 Other long term (current) drug therapy: Secondary | ICD-10-CM

## 2018-11-26 DIAGNOSIS — Z7984 Long term (current) use of oral hypoglycemic drugs: Secondary | ICD-10-CM

## 2018-11-26 DIAGNOSIS — G620 Drug-induced polyneuropathy: Secondary | ICD-10-CM | POA: Diagnosis not present

## 2018-11-26 DIAGNOSIS — R160 Hepatomegaly, not elsewhere classified: Secondary | ICD-10-CM

## 2018-11-26 DIAGNOSIS — T451X5A Adverse effect of antineoplastic and immunosuppressive drugs, initial encounter: Secondary | ICD-10-CM

## 2018-11-26 DIAGNOSIS — R918 Other nonspecific abnormal finding of lung field: Secondary | ICD-10-CM

## 2018-11-26 HISTORY — DX: Other specified counseling: Z71.89

## 2018-11-26 MED ORDER — HYDROCODONE-ACETAMINOPHEN 5-325 MG PO TABS: 1.0000 | ORAL_TABLET | Freq: Four times a day (QID) | ORAL | 0 refills | Status: DC | PRN

## 2018-11-26 NOTE — Assessment & Plan Note (Signed)
I have reviewed multiple imaging studies with the patient and her daughter She has no signs of malignancy Her recent tumor marker is stable However, the patient had multiple symptoms which I cannot attribute to chemotherapy alone but enough that they have caused significant reduction in quality of life I recommend taking a break and for her to hold treatment for a month I plan to see her back next month for further follow-up.

## 2018-11-26 NOTE — Assessment & Plan Note (Signed)
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  She cannot tolerate high doses of gabapentin.  I refilled her prescription pain medicine to take as needed

## 2018-11-26 NOTE — Telephone Encounter (Signed)
Patient decline avs and calendar °

## 2018-11-26 NOTE — Progress Notes (Signed)
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  Physical debility Her symptoms of weakness and hip pain has improved dramatically with physical therapy I have certified her to continue physical therapy for another month  Acquired hypothyroidism Repeat TSH from yesterday show market improvement.  However, symptomatically, she still have excessive fatigue and feeling unwell I recommend her to continue on her thyroid replacement therapy and have her primary care doctor monitor that closely within the next few weeks.  Multiple lung nodules on CT Majority of the lung nodules have resolved.  The patient is reassured.  Goals of care, counseling/discussion I have discussed the goals of care with the patient.  In the absence of disease, the role of adjuvant treatment is for risk reduction, however, it should not be at the expense of extensive reduction in quality of life. She agreed to take a treatment break over the next 30 days to recover from side effects of chemotherapy.  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  She cannot tolerate high doses of gabapentin.  I refilled her prescription pain medicine to take as needed   No orders of the defined types were placed in this encounter.   INTERVAL HISTORY: Please see below for problem  oriented charting. She returns with her daughter to review test results. The patient is tearful. Despite no evidence of disease on recent imaging along with improvement of TSH level, she still feels unwell.   She noticed significant joint pain and bone pain throughout.   She has improvement with physical therapy recently She denies recent cough, chest pain or shortness of breath She felt somewhat depressed due to feeling unwell.  She denies suicidal ideation. Her breast pain has improved recently. Her daughter agrees that she has poor quality of life.  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 - TUMOR INVOLVES OVARY SURFACE AND LEFT FALLOPIAN TUBE. - SEE ONCOLOGY TABLE. 2. Mesentery, small bowel mesentery biopsy #1 - BENIGN FIBROADIPOSE TISSUE. 3. Ovary and fallopian tube, right - BENIGN OVARY WITH INCLUSION CYSTS. - BENIGN FALLOPIAN TUBE WITH PARATUBAL CYSTS AND ADENOFIBROMA. 4. Omentum, resection for tumor - BENIGN ADIPOSE TISSUE. 5. Peritoneum, biopsy, left diaphragmatic - BENIGN PERITONEAL TYPE TISSUE. 6. Peritoneum, biopsy, right diaphragmatic - BENIGN PERITONEAL TYPE TISSUE. 7. Mesentery, small bowel mesenteric biopsy #2 - BENIGN FIBROADIPOSE TISSUE. 8. Lymph nodes, regional resection, right pelvic - FIVE OF FIVE LYMPH NODES NEGATIVE FOR CARCINOMA (0/5). 9. Lymph nodes, regional resection, left pelvic -  FOUR OF FOUR LYMPH NODES NEGATIVE FOR CARCINOMA (0/4). 10. Peritoneum, biopsy, left gutter - BENIGN PERITONEAL TYPE TISSUE. 11. Peritoneum, biopsy, bladder - BENIGN PERITONEAL TYPE TISSUE WITH ACUTE  INFLAMMATION AND CALCIFICATIONS.  12. Peritoneum, biopsy, cul-de-sac - BENIGN PERITONEAL TYPE TISSUE WITH ACUTE INFLAMMATION AND CALCIFICATIONS. 13. Soft tissue, biopsy, right gutter - BENIGN FIBROADIPOSE TISSUE. 14. Lymph node, biopsy, right para-aortic - TWO OF TWO LYMPH NODES NEGATIVE FOR CARCINOMA (0/2). 15. Lymph node, biopsy, left para-aortic - ONE OF ONE LYMPH NODES NEGATIVE FOR CARCINOMA (0/1). Microscopic Comment 1. OVARY Specimen(s): Left ovary and fallopian tube. Procedure: (including lymph node sampling): Bilateral salpingo-oophorectomy with omental and peritoneal biopsies and lymph node biopsies. Primary tumor site (including laterality): Left ovary. Ovarian surface involvement: Present. Ovarian capsule intact without fragmentation: Intact. Maximum tumor size (cm): 11 cm total. Histologic type: Serous cystadenocarcinoma. Grade: High grade (low grade areas also present). Peritoneal implants: (specify invasive or non-invasive): N/A. Pelvic extension (list additional structures on separate lines and if involved): Left fallopian tube. Lymph nodes: number examined 12 ; number positive 0 TNM code: pT2a, pN0, pMX FIGO Stage (based on pathologic findings, needs clinical correlation): IIA Comments: None.    01/31/2018 Pathology Results    PERITONEAL WASHING (SPECIMEN 1 OF 1 COLLECTED 01/31/18): MALIGNANT CELLS PRESENT CONSISTENT WITH METASTATIC CARCINOMA.    01/31/2018 Surgery    Procedure(s) Performed:  1. Robotic BSO and washings. 2. Exploratory laparotomy, Staging including infragastric omentectomy, Bilateral pelvic and paraaortic lymphadenectomy, pertioneal biopsies.  Specimens: Bilateral tubes / ovaries, bilateral pelvic and paraaortic lymph nodes, peritoneal biopsies, washings and omentum.  Operative Findings: The left adnexa was adherent to the left pelvic peritoneum suspected due to the inferior retraction from the vaginal hysterectomy. Intraoperative leakage of  cystic fluid from left ovary.  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  The right adnexa grossly appeared normal although slightly enlarged for her age.  There were some indurated lymph nodes but no overtly malignant lymph nodes were suspected.  Small bowel mesentery had 3-4 superficial possible implants.  1 of these was sent for frozen section returned benign.  She did have adhesive disease from her open cholecystectomy in the right upper quadrant.  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 16 mm exophytic lesion upper pole right kidney has attenuation too high to be a simple cyst. This may be a cyst complicated by proteinaceous debris or hemorrhage, but renal cell carcinoma is a concern. Routine outpatient follow-up MRI of the abdomen without and with contrast recommended after resolution of patient's acute symptoms (so she is better able to participate with positioning and breath holding). 2. Mild edema/possible minimal hemorrhage in the extraperitoneal soft tissues of the pelvic for tracking up both pelvic sidewalls. Imaging appearance is not outside of the spectrum of findings expected 6 days after the reported surgery. Trace interloop mesenteric and free fluid is identified in the peritoneal cavity in there is some mild edema in the omentum. There is no organized or rim enhancing collection to suggest evolving abscess. 3. Gas  in the extraperitoneal soft tissues of the left lower quadrant is associated with gas in the subcutaneous fat of the lower left  anterior abdominal wall. This is not unexpected on postoperative day 6. No evidence for intraperitoneal free air. 4. No CT features to suggest small bowel obstruction. Oral contrast material has migrated about halfway through the small bowel loops but there is no differential distention of proximal versus distal small bowel. The colon is diffusely distended and fluid-filled proximally, but formed stool is noted in the distal colon. Given apparent slow migration of contrast and fluid-filled small and large bowel loops, a component of ileus is possible.    02/14/2018 Imaging    MR abdomen Benign Bosniak category 2 cyst in upper pole of right kidney, corresponding with lesion seen on previous CT. No evidence of renal neoplasm or other significant abnormality.     02/18/2018 Cancer Staging    Staging form: Ovary, Fallopian Tube, and Primary Peritoneal Carcinoma, AJCC 8th Edition - Pathologic: Stage II (pT2, pN0, cM0) - Signed by Heath Lark, MD on 02/18/2018    02/28/2018 Procedure    Status post right IJ port catheter placement. 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 For last cycle, taxol was omitted    07/18/2018 Imaging    1. No evidence for residual or recurrent tumor within the abdomen or pelvis. There has been interval resolution edema and fluid within the peritoneal cavity. 2. New pulmonary nodule is identified within the right lower lobe measuring 1.1 cm, image 87/4. Consider one of the following in 3 months for both low-risk and high-risk individuals: (a) repeat chest CT, (b) follow-up PET-CT, or (c) tissue sampling. This recommendation follows the consensus statement: Guidelines for Management of Incidental Pulmonary Nodules Detected on CT  Images: From the Fleischner Society 2017; Radiology 2017; 284:228-243. 3. Stable 7 mm nodule within the right upper lobe. 4. Aortic Atherosclerosis (ICD10-I70.0). 5. Multi vessel coronary artery atherosclerotic calcifications.    07/18/2018 Tumor Marker    Patient's tumor was tested for the following markers: CA-125 Results of the tumor marker test revealed 14.9    07/25/2018 PET scan    1. Regressing right lower lobe peripheral lung lesion, likely resolving inflammatory or atelectatic process. No hypermetabolism to suggest malignancy. I would recommend a follow-up noncontrast chest CT and 4-6 months. Small upper lobe pulmonary nodules are stable. 2. 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    07/31/2018 -  Chemotherapy    The patient started taking rucaparib    10/25/2018 Tumor Marker    Patient's tumor was tested for the following markers: CA-125 Results of the tumor marker test revealed 12.1    11/25/2018 Imaging    1. Right lower lobe nodule has resolved. Additional right lung nodules are stable. 2. Stable hazy nodularity along the left external iliac chain.  3. Hepatomegaly. 4. Aortic atherosclerosis (ICD10-170.0). Coronary artery calcification. 5. Enlarged pulmonic trunk, indicative of pulmonary arterial hypertension.     REVIEW OF SYSTEMS:   Constitutional: Denies fevers, chills or abnormal weight loss Eyes: Denies blurriness of vision Ears, nose, mouth, throat, and face: Denies mucositis or sore throat Respiratory: Denies cough, dyspnea  or wheezes Cardiovascular: Denies palpitation, chest discomfort or lower extremity swelling Gastrointestinal:  Denies nausea, heartburn or change in bowel habits Skin: Denies abnormal skin rashes Lymphatics: Denies new lymphadenopathy or easy bruising All other systems were reviewed with the patient and are negative.  I have reviewed the  past medical history, past surgical history, social history and family history with the patient and they are unchanged from previous note.  ALLERGIES:  is allergic to penicillins.  MEDICATIONS:  Current Outpatient Medications  Medication Sig Dispense Refill  . atorvastatin (LIPITOR) 20 MG tablet Take 20 mg by mouth every evening.     . Cholecalciferol (VITAMIN D) 2000 units tablet Take 2,000 Units by mouth daily after breakfast.     . Cyanocobalamin (B-12 COMPLIANCE INJECTION) 1000 MCG/ML KIT Inject 1,000 mcg as directed every 30 (thirty) days.    Marland Kitchen docusate sodium (COLACE) 100 MG capsule Take 200 mg by mouth daily as needed (severe constipation).    Marland Kitchen FLUoxetine (PROZAC) 20 MG capsule Take 20 mg by mouth daily after breakfast.    . hydrochlorothiazide (HYDRODIURIL) 25 MG tablet Take 25 mg by mouth daily.    Marland Kitchen HYDROcodone-acetaminophen (NORCO/VICODIN) 5-325 MG tablet Take 1 tablet by mouth every 6 (six) hours as needed for moderate pain. 60 tablet 0  . levothyroxine (SYNTHROID, LEVOTHROID) 125 MCG tablet Take 1 tablet (125 mcg total) by mouth daily before breakfast. 90 tablet 1  . lidocaine-prilocaine (EMLA) cream Apply to affected area once 30 g 3  . losartan (COZAAR) 100 MG tablet Take 100 mg by mouth at bedtime.     . Melatonin 5 MG TABS Take 5-10 mg by mouth at bedtime as needed (sleep).     . metFORMIN (GLUCOPHAGE-XR) 500 MG 24 hr tablet Take 1,000 mg by mouth 2 (two) times daily.    . ondansetron (ZOFRAN) 4 MG tablet Take 1 tablet (4 mg total) by mouth every 8 (eight) hours as needed for nausea or vomiting. (Patient not taking: Reported on 10/25/2018) 20 tablet 0  . ondansetron (ZOFRAN) 8 MG tablet Take 1 tablet (8 mg total) by mouth 2 (two) times daily as needed for refractory nausea / vomiting. Start on day 3 after chemo. (Patient not taking: Reported on 07/19/2018) 30 tablet 1  . prochlorperazine (COMPAZINE) 10 MG tablet Take 1 tablet (10 mg total) by mouth every 6 (six) hours as needed  (Nausea or vomiting). (Patient not taking: Reported on 07/19/2018) 30 tablet 1  . RUBRACA 300 MG tablet TAKE 2 TABLETS (600 MG TOTAL) BY MOUTH 2 TIMES DAILY. 120 tablet 0  . senna (SENOKOT) 8.6 MG tablet Take 1 tablet by mouth daily.     No current facility-administered medications for this visit.     PHYSICAL EXAMINATION: ECOG PERFORMANCE STATUS: 2 - Symptomatic, <50% confined to bed  Vitals:   11/26/18 1010  BP: 114/61  Pulse: 81  Resp: 18  Temp: 98.1 F (36.7 C)  SpO2: 100%   Filed Weights   11/26/18 1010  Weight: 169 lb 3.2 oz (76.7 kg)    GENERAL:alert, no distress and comfortable Musculoskeletal:no cyanosis of digits and no clubbing  NEURO: alert & oriented x 3 with fluent speech, no focal motor/sensory deficits.  She is somewhat tearful today  LABORATORY DATA:  I have reviewed the data as listed    Component Value Date/Time   NA 139 11/25/2018 0919   K 4.2 11/25/2018 0919   CL 103 11/25/2018 0919   CO2 25 11/25/2018 0919   GLUCOSE  125 (H) 11/25/2018 0919   BUN 14 11/25/2018 0919   CREATININE 0.90 11/25/2018 0919   CALCIUM 10.0 11/25/2018 0919   PROT 7.2 11/25/2018 0919   ALBUMIN 4.5 11/25/2018 0919   AST 31 11/25/2018 0919   ALT 27 11/25/2018 0919   ALKPHOS 60 11/25/2018 0919   BILITOT 1.3 (H) 11/25/2018 0919   GFRNONAA >60 11/25/2018 0919   GFRAA >60 11/25/2018 0919    No results found for: SPEP, UPEP  Lab Results  Component Value Date   WBC 4.9 11/25/2018   NEUTROABS 2.8 11/25/2018   HGB 11.6 (L) 11/25/2018   HCT 34.0 (L) 11/25/2018   MCV 104.3 (H) 11/25/2018   PLT 153 11/25/2018      Chemistry      Component Value Date/Time   NA 139 11/25/2018 0919   K 4.2 11/25/2018 0919   CL 103 11/25/2018 0919   CO2 25 11/25/2018 0919   BUN 14 11/25/2018 0919   CREATININE 0.90 11/25/2018 0919      Component Value Date/Time   CALCIUM 10.0 11/25/2018 0919   ALKPHOS 60 11/25/2018 0919   AST 31 11/25/2018 0919   ALT 27 11/25/2018 0919   BILITOT 1.3  (H) 11/25/2018 0919       RADIOGRAPHIC STUDIES: I have reviewed multiple imaging studies with the patient and her daughter I have personally reviewed the radiological images as listed and agreed with the findings in the report. Ct Chest W Contrast  Result Date: 11/25/2018 CLINICAL DATA:  Ovarian cancer, chemotherapy complete. Oral chemotherapy ongoing. Left axillary and left arm pain. EXAM: CT CHEST, ABDOMEN, AND PELVIS WITH CONTRAST TECHNIQUE: Multidetector CT imaging of the chest, abdomen and pelvis was performed following the standard protocol during bolus administration of intravenous contrast. CONTRAST:  165m OMNIPAQUE IOHEXOL 300 MG/ML  SOLN COMPARISON:  PET 07/25/2018 and CT chest abdomen pelvis 07/18/2018. FINDINGS: CT CHEST FINDINGS Cardiovascular: Right IJ Port-A-Cath terminates in the SVC. Atherosclerotic calcification of the aorta, aortic valve and coronary arteries. Pulmonic trunk is enlarged. Heart size normal. No pericardial effusion. Mediastinum/Nodes: No pathologically enlarged mediastinal, hilar or axillary lymph nodes. Esophagus is grossly unremarkable. Lungs/Pleura: Previously seen posterior 11 mm right lower lobe nodule has resolved in the interval. Residual right lung nodules measure up to 6 mm in the anterior segment right upper lobe, as before. Mild scarring and bronchiectasis in the right middle lobe and right lower lobe. No new pulmonary nodules. No pleural fluid. Airway is unremarkable. Musculoskeletal: Degenerative changes in the spine. No worrisome lytic or sclerotic lesions. CT ABDOMEN PELVIS FINDINGS Hepatobiliary: Liver measures 20 cm and is otherwise unremarkable. Cholecystectomy. No biliary ductal dilatation. Pancreas: Negative. Spleen: Negative. Adrenals/Urinary Tract: Adrenal glands are unremarkable. Hyperdense lesion off the upper pole right kidney measures 1.8 cm and is difficult to further characterize without precontrast imaging. Note is made that the lesion was  hyperdense on 07/25/2018, favoring a hyperdense cyst. Subcentimeter low-attenuation lesion in the left kidney is too small to characterize. Ureters are decompressed. Bladder is low in volume. Stomach/Bowel: Small hiatal hernia. Stomach is decompressed. Stomach, small bowel, appendix and colon are unremarkable. Vascular/Lymphatic: Atherosclerotic calcification of the aorta without aneurysm. Vague hazy nodularity along the left external iliac chain measures up to 10 mm (series 2, image 107), stable when remeasured on the prior exam. Reproductive: Hysterectomy.  No adnexal mass. Other: No free fluid. Mesenteries and peritoneum are otherwise unremarkable. Musculoskeletal: Degenerative changes in the spine. No worrisome lytic or sclerotic lesions. IMPRESSION: 1. Right lower lobe nodule has  resolved. Additional right lung nodules are stable. 2. Stable hazy nodularity along the left external iliac chain. 3. Hepatomegaly. 4. Aortic atherosclerosis (ICD10-170.0). Coronary artery calcification. 5. Enlarged pulmonic trunk, indicative of pulmonary arterial hypertension. Electronically Signed   By: Lorin Picket M.D.   On: 11/25/2018 13:04   Ct Abdomen Pelvis W Contrast  Result Date: 11/25/2018 CLINICAL DATA:  Ovarian cancer, chemotherapy complete. Oral chemotherapy ongoing. Left axillary and left arm pain. EXAM: CT CHEST, ABDOMEN, AND PELVIS WITH CONTRAST TECHNIQUE: Multidetector CT imaging of the chest, abdomen and pelvis was performed following the standard protocol during bolus administration of intravenous contrast. CONTRAST:  162m OMNIPAQUE IOHEXOL 300 MG/ML  SOLN COMPARISON:  PET 07/25/2018 and CT chest abdomen pelvis 07/18/2018. FINDINGS: CT CHEST FINDINGS Cardiovascular: Right IJ Port-A-Cath terminates in the SVC. Atherosclerotic calcification of the aorta, aortic valve and coronary arteries. Pulmonic trunk is enlarged. Heart size normal. No pericardial effusion. Mediastinum/Nodes: No pathologically enlarged  mediastinal, hilar or axillary lymph nodes. Esophagus is grossly unremarkable. Lungs/Pleura: Previously seen posterior 11 mm right lower lobe nodule has resolved in the interval. Residual right lung nodules measure up to 6 mm in the anterior segment right upper lobe, as before. Mild scarring and bronchiectasis in the right middle lobe and right lower lobe. No new pulmonary nodules. No pleural fluid. Airway is unremarkable. Musculoskeletal: Degenerative changes in the spine. No worrisome lytic or sclerotic lesions. CT ABDOMEN PELVIS FINDINGS Hepatobiliary: Liver measures 20 cm and is otherwise unremarkable. Cholecystectomy. No biliary ductal dilatation. Pancreas: Negative. Spleen: Negative. Adrenals/Urinary Tract: Adrenal glands are unremarkable. Hyperdense lesion off the upper pole right kidney measures 1.8 cm and is difficult to further characterize without precontrast imaging. Note is made that the lesion was hyperdense on 07/25/2018, favoring a hyperdense cyst. Subcentimeter low-attenuation lesion in the left kidney is too small to characterize. Ureters are decompressed. Bladder is low in volume. Stomach/Bowel: Small hiatal hernia. Stomach is decompressed. Stomach, small bowel, appendix and colon are unremarkable. Vascular/Lymphatic: Atherosclerotic calcification of the aorta without aneurysm. Vague hazy nodularity along the left external iliac chain measures up to 10 mm (series 2, image 107), stable when remeasured on the prior exam. Reproductive: Hysterectomy.  No adnexal mass. Other: No free fluid. Mesenteries and peritoneum are otherwise unremarkable. Musculoskeletal: Degenerative changes in the spine. No worrisome lytic or sclerotic lesions. IMPRESSION: 1. Right lower lobe nodule has resolved. Additional right lung nodules are stable. 2. Stable hazy nodularity along the left external iliac chain. 3. Hepatomegaly. 4. Aortic atherosclerosis (ICD10-170.0). Coronary artery calcification. 5. Enlarged pulmonic  trunk, indicative of pulmonary arterial hypertension. Electronically Signed   By: MLorin PicketM.D.   On: 11/25/2018 13:04    All questions were answered. The patient knows to call the clinic with any problems, questions or concerns. No barriers to learning was detected.  I spent 30 minutes counseling the patient face to face. The total time spent in the appointment was 40 minutes and more than 50% was on counseling and review of test results  NHeath Lark MD 11/26/2018 3:44 PM

## 2018-11-26 NOTE — Assessment & Plan Note (Signed)
Repeat TSH from yesterday show market improvement.  However, symptomatically, she still have excessive fatigue and feeling unwell I recommend her to continue on her thyroid replacement therapy and have her primary care doctor monitor that closely within the next few weeks.

## 2018-11-26 NOTE — Assessment & Plan Note (Signed)
I have discussed the goals of care with the patient.  In the absence of disease, the role of adjuvant treatment is for risk reduction, however, it should not be at the expense of extensive reduction in quality of life. She agreed to take a treatment break over the next 30 days to recover from side effects of chemotherapy.

## 2018-11-26 NOTE — Assessment & Plan Note (Addendum)
Majority of the lung nodules have resolved.  The patient is reassured.

## 2018-11-26 NOTE — Assessment & Plan Note (Signed)
Her symptoms of weakness and hip pain has improved dramatically with physical therapy I have certified her to continue physical therapy for another month

## 2018-11-28 DIAGNOSIS — I2510 Atherosclerotic heart disease of native coronary artery without angina pectoris: Secondary | ICD-10-CM | POA: Insufficient documentation

## 2018-11-28 DIAGNOSIS — I272 Pulmonary hypertension, unspecified: Secondary | ICD-10-CM

## 2018-11-28 HISTORY — DX: Atherosclerotic heart disease of native coronary artery without angina pectoris: I25.10

## 2018-11-28 HISTORY — DX: Pulmonary hypertension, unspecified: I27.20

## 2018-12-02 ENCOUNTER — Telehealth: Payer: Self-pay | Admitting: Oncology

## 2018-12-02 ENCOUNTER — Other Ambulatory Visit: Payer: Self-pay | Admitting: Hematology and Oncology

## 2018-12-02 DIAGNOSIS — T451X5A Adverse effect of antineoplastic and immunosuppressive drugs, initial encounter: Principal | ICD-10-CM

## 2018-12-02 DIAGNOSIS — G620 Drug-induced polyneuropathy: Secondary | ICD-10-CM

## 2018-12-02 MED ORDER — GABAPENTIN 300 MG PO CAPS: ORAL_CAPSULE | ORAL | 11 refills | Status: DC

## 2018-12-02 MED ORDER — HYDROCODONE-ACETAMINOPHEN 10-325 MG PO TABS: 1.0000 | ORAL_TABLET | Freq: Four times a day (QID) | ORAL | 0 refills | Status: DC | PRN

## 2018-12-02 NOTE — Telephone Encounter (Signed)
Oral Oncology Patient Advocate Encounter  The patient wanted me to update her when I could find out what her [place] would be for February. The Feb copay is $876.86, between the [facility] and [employer] grant this will take care of this copay for 7 months. The patient stated that she is off of Rubraca for a month and will restart in March so these grants will be rotated stating for the March fill and last thru the September fill. I will put her on my calendar to check these grant and get her a new grant around July-August if needed.   I called the patient and gave her this information, she verbalized understanding and great appreciation.  During our talk, the patient was crying and said that her neuropathy is very bad and her thyroid is not ok. I asked her to let me transfer her to the nurse and she declined saying that her daughter has already called and she will call Elmo Putt if her daughter didn't speak to someone.  [place] Patient [place] Phone [phone] Fax [phone]

## 2018-12-02 NOTE — Telephone Encounter (Signed)
[name] said she saw a PA from her PCP's office last week and they are scheduling her to see a pulmonologist.  She thought [name] recommended that she see a cardiologist so she would like to double check.  She said that if she is supposed to see a cardiologist, she would like [name] to arrange it through [facility].  She also said that she continues to have severe joint pain and neuropathy in her feet.  She is taking gabapentin 300 mg TID and hydrocodone/acetaminophen 1 tablet q 6 hours during the day and 1-2 tablets after 4 pm (the pain is worse in the evening and overnight).  She said that she knows the pain should get better when her TSH is at a normal level.

## 2018-12-02 NOTE — Telephone Encounter (Signed)
done

## 2018-12-02 NOTE — Telephone Encounter (Signed)
Called [place] back and advised her to go and see the pulmonologist per [name].  Also asked if she would like to increase the hydrocodone/acetaminophin to 10/325 mg and gabapentin 300 mg BID and 600 mg at HS to help manage her pain.  She said she would like to try them and would like them sent to [facility].

## 2018-12-09 ENCOUNTER — Telehealth: Payer: Self-pay | Admitting: Oncology

## 2018-12-09 NOTE — Telephone Encounter (Signed)
[name] with message from [name].  She verbalized agreement.

## 2018-12-09 NOTE — Telephone Encounter (Addendum)
[name] called and said she is feeling much better and the neuropathy has improved. She saw Dr. Bea Graff last week on 12/03/18 and had labs done.  Her TSH was 6.955.  Her cortisol level was low at 4.3 so she is going to have a cortisol stimulation test on Wednesday.  She also is going to see Dr. Loanne Drilling with pulmonary on Thursday.  She is wondering if [name] will need to check her TSH level again before her appointment on 12/26/18.

## 2018-12-09 NOTE — Telephone Encounter (Signed)
No, since I am not managing her thyroid long term she should just have her TSH monitored by her primary doctor

## 2018-12-12 ENCOUNTER — Encounter: Payer: Self-pay | Admitting: Pulmonary Disease

## 2018-12-12 ENCOUNTER — Ambulatory Visit: Payer: Medicare HMO | Admitting: Pulmonary Disease

## 2018-12-12 VITALS — BP 118/70 | HR 69 | Ht 64.0 in | Wt 170.2 lb

## 2018-12-12 DIAGNOSIS — I270 Primary pulmonary hypertension: Secondary | ICD-10-CM | POA: Diagnosis not present

## 2018-12-12 DIAGNOSIS — R0609 Other forms of dyspnea: Secondary | ICD-10-CM

## 2018-12-12 DIAGNOSIS — R5383 Other fatigue: Secondary | ICD-10-CM | POA: Diagnosis not present

## 2018-12-12 NOTE — Progress Notes (Signed)
Subjective:   PATIENT ID: [name] GENDER: female DOB: [birth year], MRN: [id number]   HPI  Chief Complaint  Patient presents with  . Consult    Referred by MD Owens Shark. States she has ovarian cancer and she recently had a CT scan that was abnormal. She denies any symptoms. Chest CT 11/25/18    Reason for Visit: New consult for [facility] suggested on CTA  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 Daughter has accompanied her to her visit today.  She was referred to Pulmonary clinic after reviewing CT imaging for her cancer with her internal medicine physician. She has had multiple complaints over the last year including worsening dyspnea, neuropathy, general mylagia and fatigue which are thought to be chemotherapy related but her work-up has revealed abnormal TSH and cortisol levels. Her oncologist is currently holding her chemotherapy this month due to her worsening fatigue and neuropathy. Prior to her cancer diagnosis, she describes herself as very active and routinely going to the gym doing cardio and weight exercises. When she attends her weekly PT sessions, she feels she fatigues easily due to general pain and notices shortness of breath with high exertion. Denies syncope, chest pain, leg swelling. Denies cough, wheezing, hemoptysis. Denies fevers, chills, rash.  I have personally reviewed patient's past medical/family/social history, allergies, current medications.  Past Medical History:  Diagnosis Date  . Arthritis   . Diabetes mellitus without complication ([place])    type 2   . GERD (gastroesophageal reflux disease)   . Hypertension   . Hypothyroidism   . Ovarian cancer ([place]) 01/31/2018  . Pneumonia    its been 4 years   . PONV (postoperative nausea and vomiting)   . Vitamin B 12 deficiency      Family History  Problem Relation Age of Onset  . Lymphoma Mother 56  .  Bladder Cancer Sister 23  . Prostate cancer Brother 44       has surgery right away after dx  . Leukemia Brother        46's  . Breast cancer Other 43       niece, GT 2017 reportedly neg  . Prostate cancer Father 60       found on autopsy at death (27)  . Lung cancer Maternal Aunt   . Esophageal cancer Maternal Uncle   . Other Maternal Grandfather 75       cerebral hemmhorage  . Lung cancer Cousin   . Lung cancer Cousin   . Leukemia Cousin      Social History   Occupational History  . Occupation: retired Glass blower/designer  Tobacco Use  . Smoking status: Never Smoker  . Smokeless tobacco: Never Used  Substance and Sexual Activity  . Alcohol use: No  . Drug use: No  . Sexual activity: Not on file    Allergies  Allergen Reactions  . Penicillins Anaphylaxis    Has patient had a PCN reaction causing immediate rash, facial/tongue/throat swelling, SOB or lightheadedness with hypotension: Yes Has patient had a PCN reaction causing severe rash involving mucus membranes or skin necrosis: No Has patient had a PCN reaction that required hospitalization: Yes Has patient had a PCN reaction occurring within the last 10 years: No If all of the above answers are "NO", then may proceed with Cephalosporin use.      Outpatient Medications Prior to Visit  Medication Sig Dispense Refill  .  Cyanocobalamin (B-12 COMPLIANCE INJECTION) 1000 MCG/ML KIT Inject 1,000 mcg as directed every 30 (thirty) days.    Marland Kitchen docusate sodium (COLACE) 100 MG capsule Take 200 mg by mouth daily as needed (severe constipation).    Marland Kitchen FLUoxetine (PROZAC) 20 MG capsule Take 30 mg by mouth daily after breakfast.     . gabapentin (NEURONTIN) 300 MG capsule Take 1 capsule in the morning and afternoon and 2 capsules in the evening 120 capsule 11  . hydrochlorothiazide (HYDRODIURIL) 25 MG tablet Take 25 mg by mouth daily.    Marland Kitchen HYDROcodone-acetaminophen (NORCO) 10-325 MG tablet Take 1 tablet by mouth every 6 (six) hours as  needed. 60 tablet 0  . levothyroxine (SYNTHROID, LEVOTHROID) 125 MCG tablet Take 1 tablet (125 mcg total) by mouth daily before breakfast. 90 tablet 1  . lidocaine-prilocaine (EMLA) cream Apply to affected area once 30 g 3  . losartan (COZAAR) 100 MG tablet Take 100 mg by mouth at bedtime.     . Melatonin 5 MG TABS Take 5-10 mg by mouth at bedtime as needed (sleep).     . metFORMIN (GLUCOPHAGE-XR) 500 MG 24 hr tablet Take 1,000 mg by mouth 2 (two) times daily.    Marland Kitchen senna (SENOKOT) 8.6 MG tablet Take 1 tablet by mouth daily.    . RUBRACA 300 MG tablet TAKE 2 TABLETS (600 MG TOTAL) BY MOUTH 2 TIMES DAILY. (Patient not taking: Reported on 12/12/2018) 120 tablet 0  . atorvastatin (LIPITOR) 20 MG tablet Take 20 mg by mouth every evening.     . Cholecalciferol (VITAMIN D) 2000 units tablet Take 2,000 Units by mouth daily after breakfast.     . HYDROcodone-acetaminophen (NORCO/VICODIN) 5-325 MG tablet Take 1 tablet by mouth every 6 (six) hours as needed for moderate pain. (Patient not taking: Reported on 12/12/2018) 60 tablet 0  . ondansetron (ZOFRAN) 4 MG tablet Take 1 tablet (4 mg total) by mouth every 8 (eight) hours as needed for nausea or vomiting. (Patient not taking: Reported on 10/25/2018) 20 tablet 0  . ondansetron (ZOFRAN) 8 MG tablet Take 1 tablet (8 mg total) by mouth 2 (two) times daily as needed for refractory nausea / vomiting. Start on day 3 after chemo. (Patient not taking: Reported on 07/19/2018) 30 tablet 1  . prochlorperazine (COMPAZINE) 10 MG tablet Take 1 tablet (10 mg total) by mouth every 6 (six) hours as needed (Nausea or vomiting). (Patient not taking: Reported on 07/19/2018) 30 tablet 1   No facility-administered medications prior to visit.     Review of Systems  Constitutional: Positive for malaise/fatigue. Negative for chills, diaphoresis, fever and weight loss.  HENT: Negative for congestion, ear pain and sore throat.   Respiratory: Positive for shortness of breath. Negative  for cough, hemoptysis, sputum production and wheezing.   Cardiovascular: Negative for chest pain, palpitations and leg swelling.  Gastrointestinal: Negative for abdominal pain, heartburn and nausea.  Genitourinary: Negative for frequency.  Musculoskeletal: Positive for back pain, joint pain and myalgias.  Skin: Negative for itching and rash.  Neurological: Positive for tingling, sensory change and weakness. Negative for dizziness and headaches.  Endo/Heme/Allergies: Does not bruise/bleed easily.  Psychiatric/Behavioral: Negative for depression. The patient is nervous/anxious.      Objective:   Vitals:   12/12/18 1444  BP: 118/70  Pulse: 69  SpO2: 99%  Weight: 170 lb 3.2 oz (77.2 kg)  Height: 5' 4"  (1.626 m)   SpO2: 99 %  Physical Exam: General: Well-appearing, no acute distress HENT: [place],  AT, OP clear, MMM Eyes: EOMI, no scleral icterus Respiratory: Clear to auscultation bilaterally.  No crackles, wheezing or rales Cardiovascular: RRR, -M/R/G, no JVD GI: BS+, soft, nontender Extremities:-Edema,-tenderness Neuro: AAO x4, CNII-XII grossly intact, abnormal sensation of distal extremities x 4, abnormal gait Skin: Intact, no rashes or bruising Psych: Anxious mood, normal affect Lines: Right chest port-a-cath  Data Reviewed:  Imaging: CT Chest 11/25/18 - Enlarged pulmonary artery trunk. No mediastinal adenopathy. Resolved RLL lung nodule. Unchanged right upper lobe lung nodules.  PFT: None on file  Labs: TSH 59 >17 T4 wnl (11/25/18) Hg 11.6  (11/25/18)  Imaging, labs and tests noted above have been reviewed independently by me.    Assessment & Plan:   Discussion: 78 year old female never smoker with ovarian cancer s/p chemotherapy who presents as new consult for exertional dyspnea and fatigue and CT finding concerning for pulmonary hypertension. Recent concern for hypothyroidism, now with normal T4 after increasing synthroid. No signs of RV heart failure. Will start  initial evaluation for her symptoms with PFTs and echocardiogram.  Exertional dyspnea and fatigue CT with incidental enlarged pulmonary artery -TTE -PFTs  Health Maintenance Pneumonia: Prevnar 03/23/14 Influenza 07/29/18  Orders Placed This Encounter  Procedures  . ECHOCARDIOGRAM COMPLETE    Standing Status:   Future    Standing Expiration Date:   03/11/2020    Order Specific Question:   Where should this test be performed    Answer:   [facility]    Order Specific Question:   Perflutren DEFINITY (image enhancing agent) should be administered unless hypersensitivity or allergy exist    Answer:   Administer Perflutren    Order Specific Question:   Reason for exam-Echo    Answer:   Pulmonary hypertension  416.8 / I27.2  No orders of the defined types were placed in this encounter.   Return in 25 days (on 01/06/2019).  [place], MD [facility] 12/12/2018 8:22 PM  Office Number [phone]

## 2018-12-12 NOTE — Patient Instructions (Signed)
RTC March 23rd for your Pulmonary Function Test. We will review your echocardiogram at this time.

## 2018-12-16 ENCOUNTER — Other Ambulatory Visit (HOSPITAL_COMMUNITY): Payer: Medicare HMO

## 2018-12-17 ENCOUNTER — Telehealth: Payer: Self-pay | Admitting: Oncology

## 2018-12-17 ENCOUNTER — Ambulatory Visit (HOSPITAL_COMMUNITY): Payer: Medicare HMO | Attending: Cardiovascular Disease

## 2018-12-17 DIAGNOSIS — I270 Primary pulmonary hypertension: Secondary | ICD-10-CM | POA: Insufficient documentation

## 2018-12-17 NOTE — Telephone Encounter (Signed)
[name] called and asked if she could have her labs checked including TSH before her appointment with [name] on 12/26/18.  She also asked if it is time for her port to be flushed (last flushed on 11/25/18). Advised her it is usually every 6 weeks but that I will ask [name].Marland Kitchen

## 2018-12-18 ENCOUNTER — Telehealth: Payer: Self-pay | Admitting: Oncology

## 2018-12-18 NOTE — Telephone Encounter (Signed)
I will defer to her primary care to manage her TSH The port only needs to be flushed every 6-8 weeks  I suggest we keep her appt as is for now

## 2018-12-18 NOTE — Telephone Encounter (Signed)
[name] with message from [name].  She verbalized agreement.  She also mentioned that she is having right hip pain again from bursitis and Dr. Bea Graff has given her a prescription for prednisone.

## 2018-12-19 ENCOUNTER — Telehealth: Payer: Self-pay | Admitting: Pulmonary Disease

## 2018-12-19 NOTE — Telephone Encounter (Signed)
Called and spoke with Patient.  Patient is requesting results from recent echo results, 12/17/18.  Patient aware Dr Loanne Drilling is not in the office this week.    Message routed to [facility], NP to advise on possible results

## 2018-12-19 NOTE — Telephone Encounter (Signed)
Called the patient and advised of her of the response noted below. Confirmed PFT and appointment on 01/06/19.   Patient voiced understanding. Nothing further needed at this time.

## 2018-12-19 NOTE — Telephone Encounter (Signed)
No concerning acute findings, best to wait for full interpretation of results and plan from Dr. Loanne Drilling.

## 2018-12-24 NOTE — Progress Notes (Signed)
Please contact patient regarding test results.  [name] -   I have reviewed your echocardiogram and am happy to report that there is no evidence of pulmonary arterial hypertension suggested. There are changes that do suggest diastolic dysfunction also known as impaired relaxation of the heart but this is common finding that can occur in older individuals and based on your last exam with me, this does not require any treatment. I will see you at your next appointment with the pulmonary function testing. Feel free to call for questions or concerns before then.  Sincerely, Rodman Pickle, MD

## 2018-12-24 NOTE — Progress Notes (Signed)
Pt called in requesting. Informed her of the results and recs of the echo. Pt verbalized understanding and denied any further questions or concerns at this time.

## 2018-12-26 ENCOUNTER — Other Ambulatory Visit: Payer: Self-pay

## 2018-12-26 ENCOUNTER — Inpatient Hospital Stay: Payer: Medicare HMO | Attending: Gynecologic Oncology | Admitting: Hematology and Oncology

## 2018-12-26 DIAGNOSIS — M2550 Pain in unspecified joint: Secondary | ICD-10-CM | POA: Insufficient documentation

## 2018-12-26 DIAGNOSIS — G620 Drug-induced polyneuropathy: Secondary | ICD-10-CM | POA: Diagnosis not present

## 2018-12-26 DIAGNOSIS — Z7984 Long term (current) use of oral hypoglycemic drugs: Secondary | ICD-10-CM | POA: Insufficient documentation

## 2018-12-26 DIAGNOSIS — Z79899 Other long term (current) drug therapy: Secondary | ICD-10-CM | POA: Diagnosis not present

## 2018-12-26 DIAGNOSIS — Z9221 Personal history of antineoplastic chemotherapy: Secondary | ICD-10-CM

## 2018-12-26 DIAGNOSIS — C562 Malignant neoplasm of left ovary: Secondary | ICD-10-CM | POA: Diagnosis present

## 2018-12-26 DIAGNOSIS — T451X5A Adverse effect of antineoplastic and immunosuppressive drugs, initial encounter: Secondary | ICD-10-CM | POA: Insufficient documentation

## 2018-12-26 DIAGNOSIS — E039 Hypothyroidism, unspecified: Secondary | ICD-10-CM | POA: Diagnosis not present

## 2018-12-27 ENCOUNTER — Encounter: Payer: Self-pay | Admitting: Hematology and Oncology

## 2018-12-27 NOTE — Assessment & Plan Note (Signed)
She is recovering well from side effects of recent treatment and changes in her thyroid medication I recommend prednisone taper by next week If she has no problems, she will resume chemotherapy by the fourth week of March and I will see her back in April for further blood work and follow-up.

## 2018-12-27 NOTE — Progress Notes (Signed)
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  Once she is able to get her prednisone tapered off, she will resume chemotherapy   No orders of the defined types were placed in this encounter.   INTERVAL HISTORY: Please see below for problem oriented charting. She returns with her daughter for further follow-up She is doing well Once her TSH is back to normal, her symptoms of depression, fatigue and others had improved She continues to have intermittent diffuse joint pain and she was placed on low-dose prednisone therapy which seems to help.  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 Ovary and fallopian tube, left - SEROUS CYSTADENOCARCINOMA, HIGH GRADE, SPANNING  APPROXIMATELY 11 CM. - TUMOR INVOLVES OVARY SURFACE AND LEFT FALLOPIAN TUBE. - SEE ONCOLOGY TABLE. 2. Mesentery, small bowel mesentery biopsy #1 - BENIGN FIBROADIPOSE TISSUE. 3. Ovary and fallopian tube, right - BENIGN OVARY WITH INCLUSION CYSTS. - BENIGN FALLOPIAN TUBE WITH PARATUBAL CYSTS AND ADENOFIBROMA. 4. Omentum, resection for tumor - BENIGN ADIPOSE TISSUE. 5. Peritoneum, biopsy, left diaphragmatic - BENIGN PERITONEAL TYPE TISSUE. 6. Peritoneum, biopsy, right diaphragmatic - BENIGN PERITONEAL TYPE TISSUE. 7. Mesentery, small bowel mesenteric biopsy #2 - BENIGN FIBROADIPOSE TISSUE. 8. Lymph nodes, regional resection, right pelvic - FIVE OF FIVE LYMPH NODES NEGATIVE FOR CARCINOMA (0/5). 9. Lymph nodes, regional resection, left pelvic - FOUR OF FOUR LYMPH NODES NEGATIVE FOR CARCINOMA (0/4). 10. Peritoneum, biopsy, left gutter - BENIGN PERITONEAL TYPE TISSUE. 11. Peritoneum, biopsy, bladder - BENIGN PERITONEAL TYPE TISSUE WITH ACUTE INFLAMMATION AND CALCIFICATIONS.  12. Peritoneum, biopsy, cul-de-sac - BENIGN PERITONEAL TYPE TISSUE WITH ACUTE INFLAMMATION AND CALCIFICATIONS. 13. Soft tissue, biopsy, right gutter - BENIGN FIBROADIPOSE TISSUE. 14. Lymph node, biopsy, right para-aortic - TWO OF TWO LYMPH NODES NEGATIVE FOR CARCINOMA (0/2). 15. Lymph node, biopsy, left para-aortic - ONE OF ONE LYMPH NODES NEGATIVE FOR CARCINOMA (0/1). Microscopic Comment 1. OVARY Specimen(s): Left ovary and fallopian tube. Procedure: (including lymph node sampling): Bilateral salpingo-oophorectomy with omental and peritoneal biopsies and lymph node biopsies. Primary tumor site (including laterality): Left ovary. Ovarian surface involvement: Present. Ovarian capsule intact without fragmentation: Intact. Maximum tumor size (cm): 11 cm total. Histologic type: Serous cystadenocarcinoma. Grade: High grade (low grade areas also present). Peritoneal implants: (specify invasive or non-invasive):  N/A. Pelvic extension (list additional structures on separate lines and if involved): Left fallopian tube. Lymph nodes: number examined 12 ; number positive 0 TNM code: pT2a, pN0, pMX FIGO Stage (based on pathologic findings, needs clinical correlation): IIA Comments: None.    01/31/2018 Pathology Results    PERITONEAL WASHING (SPECIMEN 1 OF 1 COLLECTED 01/31/18): MALIGNANT CELLS PRESENT CONSISTENT WITH METASTATIC CARCINOMA.    01/31/2018 Surgery    Procedure(s) Performed:  1. Robotic BSO and washings. 2. Exploratory laparotomy, Staging including infragastric omentectomy, Bilateral pelvic and paraaortic lymphadenectomy, pertioneal biopsies.  Specimens: Bilateral tubes / ovaries, bilateral pelvic and paraaortic lymph nodes, peritoneal biopsies, washings and omentum.  Operative Findings: The left adnexa was adherent to the left pelvic peritoneum suspected due to the inferior retraction from the vaginal hysterectomy. Intraoperative leakage of cystic fluid from left ovary.  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  The right adnexa grossly appeared normal although slightly enlarged for her age.  There were some indurated lymph nodes but no overtly malignant lymph nodes were suspected.  Small bowel mesentery had 3-4 superficial possible implants.  1 of these was sent for frozen section returned benign.  She did have adhesive disease from her open cholecystectomy in the right upper quadrant.  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 16 mm exophytic  lesion upper pole right kidney has attenuation too high to be a simple cyst. This may be a cyst complicated by proteinaceous debris or hemorrhage, but renal cell carcinoma is a concern. Routine outpatient follow-up MRI of the abdomen without and with contrast recommended after resolution of patient's acute symptoms (so she is better able to participate with positioning and breath holding). 2. Mild edema/possible minimal hemorrhage in the extraperitoneal soft tissues of the pelvic for tracking up both pelvic sidewalls. Imaging appearance is not outside of the spectrum of findings expected 6 days after the reported surgery. Trace interloop mesenteric and free fluid is identified in the peritoneal cavity in there is some mild edema in the omentum. There is no organized or rim enhancing collection to suggest evolving abscess. 3. Gas in the extraperitoneal soft tissues of the left lower quadrant is associated with gas in the subcutaneous fat of the lower left anterior abdominal wall. This is not unexpected on postoperative day 6. No evidence for intraperitoneal free air. 4. No CT features to suggest small bowel obstruction. Oral contrast material has migrated about halfway through the small bowel loops but there is no differential distention of proximal versus distal small bowel. The colon is diffusely distended and fluid-filled proximally, but formed stool is noted in the distal colon. Given apparent slow migration of contrast and fluid-filled small and large bowel loops, a component of ileus is possible.    02/14/2018 Imaging    MR abdomen Benign Bosniak category 2 cyst in upper pole of right kidney, corresponding with lesion seen on previous CT. No evidence of renal neoplasm or other significant abnormality.     02/18/2018 Cancer Staging    Staging form: Ovary, Fallopian  Tube, and Primary Peritoneal Carcinoma, AJCC 8th Edition - Pathologic: Stage II (pT2, pN0, cM0) - Signed by Heath Lark, MD on 02/18/2018     02/28/2018 Procedure    Status post right IJ port catheter placement. 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 For last cycle, taxol was omitted    07/18/2018 Imaging    1. No evidence for residual or recurrent tumor within the abdomen or pelvis. There has been interval resolution edema and fluid within the peritoneal cavity. 2. New pulmonary nodule is identified within the right lower lobe measuring 1.1 cm, image 87/4. Consider one of the following in 3 months for both low-risk and high-risk individuals: (a) repeat chest CT, (b) follow-up PET-CT, or (c) tissue sampling. This recommendation follows the consensus statement: Guidelines for Management of Incidental Pulmonary Nodules Detected on CT Images: From the Fleischner Society 2017; Radiology 2017; 284:228-243. 3. Stable 7 mm nodule within the right upper lobe. 4. Aortic Atherosclerosis (ICD10-I70.0). 5. Multi vessel coronary artery atherosclerotic calcifications.    07/18/2018 Tumor Marker    Patient's tumor was tested for the following markers: CA-125 Results of the tumor marker test revealed 14.9    07/25/2018 PET scan    1. Regressing right lower lobe peripheral lung lesion, likely resolving inflammatory or atelectatic process. No hypermetabolism to suggest malignancy. I would recommend a follow-up noncontrast chest CT and 4-6 months. Small upper lobe pulmonary nodules are stable. 2. 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    07/31/2018 -  Chemotherapy    The patient started taking rucaparib    10/25/2018 Tumor Marker    Patient's tumor was tested for the following markers: CA-125 Results of  the tumor marker test revealed 12.1    11/25/2018 Imaging    1. Right lower lobe nodule has resolved. Additional right lung nodules are stable. 2. Stable hazy nodularity along the left external iliac chain.  3. Hepatomegaly. 4. Aortic atherosclerosis (ICD10-170.0). Coronary artery calcification. 5. Enlarged pulmonic trunk, indicative of pulmonary arterial hypertension.     REVIEW OF SYSTEMS:   Constitutional: Denies fevers, chills or abnormal weight loss Eyes: Denies blurriness of vision Ears, nose, mouth, throat, and face: Denies mucositis or sore throat Respiratory: Denies cough, dyspnea or wheezes Cardiovascular: Denies palpitation, chest discomfort or lower extremity swelling Gastrointestinal:  Denies nausea, heartburn or change in bowel habits Skin: Denies abnormal skin rashes Lymphatics: Denies new lymphadenopathy or easy bruising Neurological:Denies numbness, tingling or new weaknesses Behavioral/Psych: Mood is stable, no new changes  All other systems were reviewed with the patient and are negative.  I have reviewed the past medical history, past surgical history, social history and family history with the patient and they are unchanged from previous note.  ALLERGIES:  is allergic to penicillins.  MEDICATIONS:  Current Outpatient Medications  Medication Sig Dispense Refill  . predniSONE (DELTASONE) 5 MG tablet Take 5 mg by mouth daily with breakfast.    . Cyanocobalamin (B-12 COMPLIANCE INJECTION) 1000 MCG/ML KIT Inject 1,000 mcg as directed every 30 (thirty) days.    Marland Kitchen docusate sodium (COLACE) 100 MG capsule Take 200 mg by mouth daily as needed (severe constipation).    Marland Kitchen  FLUoxetine (PROZAC) 20 MG capsule Take 30 mg by mouth daily after breakfast.     . gabapentin (NEURONTIN) 300 MG capsule Take 1 capsule in the morning and afternoon and 2 capsules in the evening 120 capsule 11  . hydrochlorothiazide (HYDRODIURIL) 25 MG tablet Take 25 mg by mouth daily.    Marland Kitchen  HYDROcodone-acetaminophen (NORCO) 10-325 MG tablet Take 1 tablet by mouth every 6 (six) hours as needed. 60 tablet 0  . levothyroxine (SYNTHROID, LEVOTHROID) 125 MCG tablet Take 1 tablet (125 mcg total) by mouth daily before breakfast. 90 tablet 1  . lidocaine-prilocaine (EMLA) cream Apply to affected area once 30 g 3  . losartan (COZAAR) 100 MG tablet Take 100 mg by mouth at bedtime.     . Melatonin 5 MG TABS Take 5-10 mg by mouth at bedtime as needed (sleep).     . metFORMIN (GLUCOPHAGE-XR) 500 MG 24 hr tablet Take 1,000 mg by mouth 2 (two) times daily.    . RUBRACA 300 MG tablet TAKE 2 TABLETS (600 MG TOTAL) BY MOUTH 2 TIMES DAILY. (Patient not taking: Reported on 12/12/2018) 120 tablet 0   No current facility-administered medications for this visit.     PHYSICAL EXAMINATION: ECOG PERFORMANCE STATUS: 1 - Symptomatic but completely ambulatory  Vitals:   12/26/18 1045  BP: 122/66  Pulse: 82  Resp: 18  Temp: 98.2 F (36.8 C)  SpO2: 100%   Filed Weights   12/26/18 1045  Weight: 162 lb (73.5 kg)    GENERAL:alert, no distress and comfortable Musculoskeletal:no cyanosis of digits and no clubbing  NEURO: alert & oriented x 3 with fluent speech, no focal motor/sensory deficits  LABORATORY DATA:  I have reviewed the data as listed    Component Value Date/Time   NA 139 11/25/2018 0919   K 4.2 11/25/2018 0919   CL 103 11/25/2018 0919   CO2 25 11/25/2018 0919   GLUCOSE 125 (H) 11/25/2018 0919   BUN 14 11/25/2018 0919   CREATININE 0.90 11/25/2018 0919   CALCIUM 10.0 11/25/2018 0919   PROT 7.2 11/25/2018 0919   ALBUMIN 4.5 11/25/2018 0919   AST 31 11/25/2018 0919   ALT 27 11/25/2018 0919   ALKPHOS 60 11/25/2018 0919   BILITOT 1.3 (H) 11/25/2018 0919   GFRNONAA >60 11/25/2018 0919   GFRAA >60 11/25/2018 0919    No results found for: SPEP, UPEP  Lab Results  Component Value Date   WBC 4.9 11/25/2018   NEUTROABS 2.8 11/25/2018   HGB 11.6 (L) 11/25/2018   HCT 34.0 (L)  11/25/2018   MCV 104.3 (H) 11/25/2018   PLT 153 11/25/2018      Chemistry      Component Value Date/Time   NA 139 11/25/2018 0919   K 4.2 11/25/2018 0919   CL 103 11/25/2018 0919   CO2 25 11/25/2018 0919   BUN 14 11/25/2018 0919   CREATININE 0.90 11/25/2018 0919      Component Value Date/Time   CALCIUM 10.0 11/25/2018 0919   ALKPHOS 60 11/25/2018 0919   AST 31 11/25/2018 0919   ALT 27 11/25/2018 0919   BILITOT 1.3 (H) 11/25/2018 0919       All questions were answered. The patient knows to call the clinic with any problems, questions or concerns. No barriers to learning was detected.  I spent 15 minutes counseling the patient face to face. The total time spent in the appointment was 20 minutes and more than 50% was on counseling and review of test  results  Heath Lark, MD 12/27/2018 9:29 AM

## 2018-12-27 NOTE — Assessment & Plan Note (Signed)
She has multifactorial joint pain along with neuropathy Her neuropathy is stable Her joint pain is improving with prednisone I recommend slow prednisone taper.  Once she is able to get her prednisone tapered off, she will resume chemotherapy

## 2018-12-27 NOTE — Assessment & Plan Note (Signed)
Her primary care doctor is taking good care of her with recent medication adjustment According to her outside blood work, her most recent TSH is within normal limits

## 2018-12-30 ENCOUNTER — Telehealth: Payer: Self-pay | Admitting: Oncology

## 2018-12-30 NOTE — Telephone Encounter (Signed)
[name] called and said she noticed on MyChart that she does not have an apt with [name] on 01/16/19.  Apt given for 10:15 am on 01/16/19.  She verbalized agreement.

## 2019-01-06 ENCOUNTER — Ambulatory Visit: Payer: Medicare HMO | Admitting: Pulmonary Disease

## 2019-01-07 ENCOUNTER — Telehealth: Payer: Self-pay | Admitting: Oncology

## 2019-01-07 NOTE — Telephone Encounter (Signed)
Called [name] regarding prednisone.  She said she stopped taking it on Thursday and started [place] on Sunday with no side effects so far.  She is taking 1 tablet in the morning and 1 tablet at night and is wondering when she will need to go to 2 tablets BID.  She has enough medication to last until she sees [name] on 01/16/19.  [name] also said her TSH was low on 01/02/19 so she is now taking 1.12 mcg of Synthroid.  It will be rechecked in 30 days.

## 2019-01-07 NOTE — Telephone Encounter (Signed)
[name] was notified of message from [name] to continue on 1 tablet BID of Rubraca.  She verbalized understanding and agreement.

## 2019-01-07 NOTE — Telephone Encounter (Signed)
No, do not increase until I see her Stay at 1 BID only

## 2019-01-16 ENCOUNTER — Encounter: Payer: Self-pay | Admitting: Hematology and Oncology

## 2019-01-16 ENCOUNTER — Inpatient Hospital Stay: Payer: Medicare HMO | Attending: Gynecologic Oncology

## 2019-01-16 ENCOUNTER — Telehealth: Payer: Self-pay | Admitting: Hematology and Oncology

## 2019-01-16 ENCOUNTER — Inpatient Hospital Stay: Payer: Medicare HMO

## 2019-01-16 ENCOUNTER — Inpatient Hospital Stay: Payer: Medicare HMO | Admitting: Hematology and Oncology

## 2019-01-16 ENCOUNTER — Other Ambulatory Visit: Payer: Self-pay

## 2019-01-16 DIAGNOSIS — Z9221 Personal history of antineoplastic chemotherapy: Secondary | ICD-10-CM | POA: Diagnosis not present

## 2019-01-16 DIAGNOSIS — Z923 Personal history of irradiation: Secondary | ICD-10-CM

## 2019-01-16 DIAGNOSIS — G620 Drug-induced polyneuropathy: Secondary | ICD-10-CM

## 2019-01-16 DIAGNOSIS — Z7984 Long term (current) use of oral hypoglycemic drugs: Secondary | ICD-10-CM | POA: Diagnosis not present

## 2019-01-16 DIAGNOSIS — E039 Hypothyroidism, unspecified: Secondary | ICD-10-CM | POA: Insufficient documentation

## 2019-01-16 DIAGNOSIS — Z79899 Other long term (current) drug therapy: Secondary | ICD-10-CM

## 2019-01-16 DIAGNOSIS — R269 Unspecified abnormalities of gait and mobility: Secondary | ICD-10-CM | POA: Diagnosis not present

## 2019-01-16 DIAGNOSIS — I10 Essential (primary) hypertension: Secondary | ICD-10-CM | POA: Diagnosis not present

## 2019-01-16 DIAGNOSIS — R5381 Other malaise: Secondary | ICD-10-CM

## 2019-01-16 DIAGNOSIS — T451X5A Adverse effect of antineoplastic and immunosuppressive drugs, initial encounter: Secondary | ICD-10-CM | POA: Insufficient documentation

## 2019-01-16 DIAGNOSIS — I700 Atherosclerosis of aorta: Secondary | ICD-10-CM | POA: Insufficient documentation

## 2019-01-16 DIAGNOSIS — E1165 Type 2 diabetes mellitus with hyperglycemia: Secondary | ICD-10-CM

## 2019-01-16 DIAGNOSIS — C562 Malignant neoplasm of left ovary: Secondary | ICD-10-CM | POA: Diagnosis not present

## 2019-01-16 DIAGNOSIS — E1151 Type 2 diabetes mellitus with diabetic peripheral angiopathy without gangrene: Secondary | ICD-10-CM | POA: Insufficient documentation

## 2019-01-16 LAB — CMP (CANCER CENTER ONLY)
ALT: 26 U/L (ref 0–44)
AST: 22 U/L (ref 15–41)
Albumin: 4 g/dL (ref 3.5–5.0)
Alkaline Phosphatase: 60 U/L (ref 38–126)
Anion gap: 10 (ref 5–15)
BUN: 25 mg/dL — ABNORMAL HIGH (ref 8–23)
CO2: 26 mmol/L (ref 22–32)
Calcium: 9.9 mg/dL (ref 8.9–10.3)
Chloride: 104 mmol/L (ref 98–111)
Creatinine: 0.87 mg/dL (ref 0.44–1.00)
GFR, Est AFR Am: >60 mL/min (ref >60)
GFR, Estimated: >60 mL/min (ref >60)
Glucose, Bld: 55 mg/dL — ABNORMAL LOW (ref 70–99)
Potassium: 4 mmol/L (ref 3.5–5.1)
Sodium: 140 mmol/L (ref 135–145)
Total Bilirubin: 0.6 mg/dL (ref 0.3–1.2)
Total Protein: 7 g/dL (ref 6.5–8.1)

## 2019-01-16 LAB — CBC WITH DIFFERENTIAL (CANCER CENTER ONLY)
Abs Immature Granulocytes: 0.02 10*3/uL (ref 0.00–0.07)
Basophils Absolute: 0 10*3/uL (ref 0.0–0.1)
Basophils Relative: 1 %
Eosinophils Absolute: 0.2 10*3/uL (ref 0.0–0.5)
Eosinophils Relative: 2 %
HCT: 37.4 % (ref 36.0–46.0)
Hemoglobin: 12.6 g/dL (ref 12.0–15.0)
Immature Granulocytes: 0 %
Lymphocytes Relative: 28 %
Lymphs Abs: 1.8 10*3/uL (ref 0.7–4.0)
MCH: 34 pg (ref 26.0–34.0)
MCHC: 33.7 g/dL (ref 30.0–36.0)
MCV: 100.8 fL — ABNORMAL HIGH (ref 80.0–100.0)
Monocytes Absolute: 0.5 10*3/uL (ref 0.1–1.0)
Monocytes Relative: 8 %
Neutro Abs: 4 10*3/uL (ref 1.7–7.7)
Neutrophils Relative %: 61 %
Platelet Count: 192 10*3/uL (ref 150–400)
RBC: 3.71 MIL/uL — ABNORMAL LOW (ref 3.87–5.11)
RDW: 13.3 % (ref 11.5–15.5)
WBC Count: 6.5 10*3/uL (ref 4.0–10.5)
nRBC: 0 % (ref 0.0–0.2)

## 2019-01-16 MED ORDER — HYDROCODONE-ACETAMINOPHEN 10-325 MG PO TABS: 1.0000 | ORAL_TABLET | Freq: Four times a day (QID) | ORAL | 0 refills | Status: DC | PRN

## 2019-01-16 MED ORDER — RUCAPARIB CAMSYLATE 300 MG PO TABS: 300.0000 mg | ORAL_TABLET | Freq: Two times a day (BID) | ORAL | 0 refills | Status: DC

## 2019-01-16 NOTE — Assessment & Plan Note (Signed)
Her blood sugar is low and she is mildly hyperglycemic She has improved with some sugary diet I recommend close follow-up with primary care doctor to adjust her medication.

## 2019-01-16 NOTE — Telephone Encounter (Signed)
Scheduled per sch msg. Called patient. No answer, left msg. Will mail printout.  °

## 2019-01-16 NOTE — Assessment & Plan Note (Signed)
Her peripheral neuropathy is stable.  Continue close observation and medication as needed

## 2019-01-16 NOTE — Progress Notes (Signed)
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  Continue close observation and medication as needed   No orders of the defined types were placed in this encounter.   INTERVAL HISTORY: Please see below for problem oriented charting. 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 Ovary and fallopian tube, left - SEROUS CYSTADENOCARCINOMA, HIGH GRADE, SPANNING APPROXIMATELY  11 CM. - TUMOR INVOLVES OVARY SURFACE AND LEFT FALLOPIAN TUBE. - SEE ONCOLOGY TABLE. 2. Mesentery, small bowel mesentery biopsy #1 - BENIGN FIBROADIPOSE TISSUE. 3. Ovary and fallopian tube, right - BENIGN OVARY WITH INCLUSION CYSTS. - BENIGN FALLOPIAN TUBE WITH PARATUBAL CYSTS AND ADENOFIBROMA. 4. Omentum, resection for tumor - BENIGN ADIPOSE TISSUE. 5. Peritoneum, biopsy, left diaphragmatic - BENIGN PERITONEAL TYPE TISSUE. 6. Peritoneum, biopsy, right diaphragmatic - BENIGN PERITONEAL TYPE TISSUE. 7. Mesentery, small bowel mesenteric biopsy #2 - BENIGN FIBROADIPOSE TISSUE. 8. Lymph nodes, regional resection, right pelvic - FIVE OF FIVE LYMPH NODES NEGATIVE FOR CARCINOMA (0/5). 9. Lymph nodes, regional resection, left pelvic - FOUR OF FOUR LYMPH NODES NEGATIVE FOR CARCINOMA (0/4). 10. Peritoneum, biopsy, left gutter - BENIGN PERITONEAL TYPE TISSUE. 11. Peritoneum, biopsy, bladder - BENIGN PERITONEAL TYPE TISSUE WITH ACUTE INFLAMMATION AND CALCIFICATIONS.  12. Peritoneum, biopsy, cul-de-sac - BENIGN PERITONEAL TYPE TISSUE WITH ACUTE INFLAMMATION AND CALCIFICATIONS. 13. Soft tissue, biopsy, right gutter - BENIGN FIBROADIPOSE TISSUE. 14. Lymph node, biopsy, right para-aortic - TWO OF TWO LYMPH NODES NEGATIVE FOR CARCINOMA (0/2). 15. Lymph node, biopsy, left para-aortic - ONE OF ONE LYMPH NODES NEGATIVE FOR CARCINOMA (0/1). Microscopic Comment 1. OVARY Specimen(s): Left ovary and fallopian tube. Procedure: (including lymph node sampling): Bilateral salpingo-oophorectomy with omental and peritoneal biopsies and lymph node biopsies. Primary tumor site (including laterality): Left ovary. Ovarian surface involvement: Present. Ovarian capsule intact without fragmentation: Intact. Maximum tumor size (cm): 11 cm total. Histologic type: Serous cystadenocarcinoma. Grade: High grade (low grade areas also present). Peritoneal implants: (specify invasive or non-invasive): N/A. Pelvic  extension (list additional structures on separate lines and if involved): Left fallopian tube. Lymph nodes:  number examined 12 ; number positive 0 TNM code: pT2a, pN0, pMX FIGO Stage (based on pathologic findings, needs clinical correlation): IIA Comments: None.    01/31/2018 Pathology Results    PERITONEAL WASHING (SPECIMEN 1 OF 1 COLLECTED 01/31/18): MALIGNANT CELLS PRESENT CONSISTENT WITH METASTATIC CARCINOMA.    01/31/2018 Surgery    Procedure(s) Performed:  1. Robotic BSO and washings. 2. Exploratory laparotomy, Staging including infragastric omentectomy, Bilateral pelvic and paraaortic lymphadenectomy, pertioneal biopsies.  Specimens: Bilateral tubes / ovaries, bilateral pelvic and paraaortic lymph nodes, peritoneal biopsies, washings and omentum.  Operative Findings: The left adnexa was adherent to the left pelvic peritoneum suspected due to the inferior retraction from the vaginal hysterectomy. Intraoperative leakage of cystic fluid from left ovary.  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  The right adnexa grossly appeared normal although slightly enlarged for her age.  There were some indurated lymph nodes but no overtly malignant lymph nodes were suspected.  Small bowel mesentery had 3-4 superficial possible implants.  1 of these was sent for frozen section returned benign.  She did have adhesive disease from her open cholecystectomy in the right upper quadrant.  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 16 mm exophytic lesion upper  pole right kidney has attenuation too high to be a simple cyst. This may be a cyst complicated by proteinaceous debris or hemorrhage, but renal cell carcinoma is a concern. Routine outpatient follow-up MRI of the abdomen without and with contrast recommended after resolution of patient's acute symptoms (so she is better able to participate with positioning and breath holding). 2. Mild edema/possible minimal hemorrhage in the extraperitoneal soft tissues of the pelvic for tracking up both pelvic sidewalls. Imaging appearance is not outside of the spectrum of findings expected 6 days after the reported surgery. Trace interloop mesenteric and free fluid is identified in the peritoneal cavity in there is some mild edema in the omentum. There is no organized or rim enhancing collection to suggest evolving abscess. 3. Gas in the extraperitoneal soft tissues of the left lower quadrant is associated with gas in the subcutaneous fat of the lower left anterior abdominal wall. This is not unexpected on postoperative day 6. No evidence for intraperitoneal free air. 4. No CT features to suggest small bowel obstruction. Oral contrast material has migrated about halfway through the small bowel loops but there is no differential distention of proximal versus distal small bowel. The colon is diffusely distended and fluid-filled proximally, but formed stool is noted in the distal colon. Given apparent slow migration of contrast and fluid-filled small and large bowel loops, a component of ileus is possible.    02/14/2018 Imaging    MR abdomen Benign Bosniak category 2 cyst in upper pole of right kidney, corresponding with lesion seen on previous CT. No evidence of renal neoplasm or other significant abnormality.     02/18/2018 Cancer Staging    Staging form: Ovary, Fallopian Tube, and Primary Peritoneal Carcinoma, AJCC 8th Edition - Pathologic: Stage II (pT2, pN0, cM0) - Signed  by Heath Lark, MD on 02/18/2018    02/28/2018  Procedure    Status post right IJ port catheter placement. 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 For last cycle, taxol was omitted    07/18/2018 Imaging    1. No evidence for residual or recurrent tumor within the abdomen or pelvis. There has been interval resolution edema and fluid within the peritoneal cavity. 2. New pulmonary nodule is identified within the right lower lobe measuring 1.1 cm, image 87/4. Consider one of the following in 3 months for both low-risk and high-risk individuals: (a) repeat chest CT, (b) follow-up PET-CT, or (c) tissue sampling. This recommendation follows the consensus statement: Guidelines for Management of Incidental Pulmonary Nodules Detected on CT Images: From the Fleischner Society 2017; Radiology 2017; 284:228-243. 3. Stable 7 mm nodule within the right upper lobe. 4. Aortic Atherosclerosis (ICD10-I70.0). 5. Multi vessel coronary artery atherosclerotic calcifications.    07/18/2018 Tumor Marker    Patient's tumor was tested for the following markers: CA-125 Results of the tumor marker test revealed 14.9    07/25/2018 PET scan    1. Regressing right lower lobe peripheral lung lesion, likely resolving inflammatory or atelectatic process. No hypermetabolism to suggest malignancy. I would recommend a follow-up noncontrast chest CT and 4-6 months. Small upper lobe pulmonary nodules are stable. 2. 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    07/31/2018 -  Chemotherapy    The patient started taking rucaparib    10/25/2018 Tumor Marker    Patient's tumor was tested for the following markers: CA-125 Results of the tumor  marker test revealed 12.1    11/25/2018 Imaging    1. Right lower lobe nodule has resolved. Additional right lung nodules are stable. 2. Stable hazy nodularity along the left external iliac chain.  3. Hepatomegaly. 4. Aortic atherosclerosis (ICD10-170.0). Coronary artery calcification. 5. Enlarged pulmonic trunk, indicative of pulmonary arterial hypertension.     REVIEW OF SYSTEMS:   Constitutional: Denies fevers, chills or abnormal weight loss Eyes: Denies blurriness of vision Ears, nose, mouth, throat, and face: Denies mucositis or sore throat Respiratory: Denies cough, dyspnea or wheezes Cardiovascular: Denies palpitation, chest discomfort or lower extremity swelling Gastrointestinal:  Denies nausea, heartburn or change in bowel habits Skin: Denies abnormal skin rashes LyBehavioral/Psych: Mood is stable, no new changes  All other systems were reviewed with the patient and are negative.  I have reviewed the past medical history, past surgical history, social history and family history with the patient and they are unchanged from previous note.  ALLERGIES:  is allergic to penicillins.  MEDICATIONS:  Current Outpatient Medications  Medication Sig Dispense Refill  . Cyanocobalamin (B-12 COMPLIANCE INJECTION) 1000 MCG/ML KIT Inject 1,000 mcg as directed every 30 (thirty) days.    Marland Kitchen docusate sodium (COLACE) 100 MG capsule Take 200 mg by mouth daily as needed (severe constipation).    Marland Kitchen FLUoxetine (PROZAC) 20 MG capsule Take 30 mg by mouth daily after breakfast.     . gabapentin (NEURONTIN) 300 MG capsule Take 1 capsule in the morning and afternoon and 2 capsules in the evening 120 capsule 11  . hydrochlorothiazide (HYDRODIURIL) 25  MG tablet Take 25 mg by mouth daily.    Marland Kitchen HYDROcodone-acetaminophen (NORCO) 10-325 MG tablet Take 1 tablet by mouth every 6 (six) hours as needed. 60 tablet 0  . levothyroxine (SYNTHROID, LEVOTHROID) 125 MCG tablet Take 1 tablet (125 mcg total) by mouth daily  before breakfast. 90 tablet 1  . lidocaine-prilocaine (EMLA) cream Apply to affected area once 30 g 3  . losartan (COZAAR) 100 MG tablet Take 100 mg by mouth at bedtime.     . Melatonin 5 MG TABS Take 5-10 mg by mouth at bedtime as needed (sleep).     . metFORMIN (GLUCOPHAGE-XR) 500 MG 24 hr tablet Take 1,000 mg by mouth 2 (two) times daily.    . rucaparib camsylate (RUBRACA) 300 MG tablet Take 1 tablet (300 mg total) by mouth 2 (two) times daily. 60 tablet 0   No current facility-administered medications for this visit.     PHYSICAL EXAMINATION: ECOG PERFORMANCE STATUS: 1 - Symptomatic but completely ambulatory GENERAL:alert, no distress and comfortable SKIN: skin color, texture, turgor are normal, no rashes or significant lesions EYES: normal, Conjunctiva are pink and non-injected, sclera clear OROPHARYNX:no exudate, no erythema and lips, buccal mucosa, and tongue normal  NECK: supple, thyroid normal size, non-tender, without nodularity LYMPH:  no palpable lymphadenopathy in the cervical, axillary or inguinal LUNGS: clear to auscultation and percussion with normal breathing effort HEART: regular rate & rhythm and no murmurs and no lower extremity edema ABDOMEN:abdomen soft, non-tender and normal bowel sounds Musculoskeletal:no cyanosis of digits and no clubbing  NEURO: alert & oriented x 3 with fluent speech, no focal motor/sensory deficits  LABORATORY DATA:  I have reviewed the data as listed    Component Value Date/Time   NA 140 01/16/2019 0921   K 4.0 01/16/2019 0921   CL 104 01/16/2019 0921   CO2 26 01/16/2019 0921   GLUCOSE 55 (L) 01/16/2019 0921   BUN 25 (H) 01/16/2019 0921   CREATININE 0.87 01/16/2019 0921   CALCIUM 9.9 01/16/2019 0921   PROT 7.0 01/16/2019 0921   ALBUMIN 4.0 01/16/2019 0921   AST 22 01/16/2019 0921   ALT 26 01/16/2019 0921   ALKPHOS 60 01/16/2019 0921   BILITOT 0.6 01/16/2019 0921   GFRNONAA >60 01/16/2019 0921   GFRAA >60 01/16/2019 0921    No  results found for: SPEP, UPEP  Lab Results  Component Value Date   WBC 6.5 01/16/2019   NEUTROABS 4.0 01/16/2019   HGB 12.6 01/16/2019   HCT 37.4 01/16/2019   MCV 100.8 (H) 01/16/2019   PLT 192 01/16/2019      Chemistry      Component Value Date/Time   NA 140 01/16/2019 0921   K 4.0 01/16/2019 0921   CL 104 01/16/2019 0921   CO2 26 01/16/2019 0921   BUN 25 (H) 01/16/2019 0921   CREATININE 0.87 01/16/2019 0921      Component Value Date/Time   CALCIUM 9.9 01/16/2019 0921   ALKPHOS 60 01/16/2019 0921   AST 22 01/16/2019 0921   ALT 26 01/16/2019 0921   BILITOT 0.6 01/16/2019 0921      All questions were answered. The patient knows to call the clinic with any problems, questions or concerns. No barriers to learning was detected.  I spent 15 minutes counseling the patient face to face. The total time spent in the appointment was 20 minutes and more than 50% was on counseling and review of test results  Heath Lark, MD 01/16/2019 12:09 PM

## 2019-01-16 NOTE — Assessment & Plan Note (Signed)
Overall, she tolerated reduced dose chemotherapy well She will continue the same We will continue serial tumor marker monitoring I will see her back again in 4 weeks for further follow-up

## 2019-01-16 NOTE — Assessment & Plan Note (Signed)
She has mild intermittent high blood pressure but that could be related to anxiety She will continue close monitoring and medication adjustment through her primary care doctor

## 2019-01-16 NOTE — Assessment & Plan Note (Signed)
She continues to have some mild gait mobility but overall improved She will continue exercise as tolerated

## 2019-01-17 ENCOUNTER — Telehealth: Payer: Self-pay | Admitting: Oncology

## 2019-01-17 LAB — CA 125: Cancer Antigen (CA) 125: 13.1 U/mL (ref 0.0–38.1)

## 2019-01-17 NOTE — Telephone Encounter (Signed)
Called [name] and notified her of CA 125 results.  Advised her that the results are good and stable per [name].  She verbalized understanding and agreement.

## 2019-01-24 ENCOUNTER — Ambulatory Visit: Payer: Medicare HMO | Admitting: Obstetrics

## 2019-01-24 ENCOUNTER — Other Ambulatory Visit: Payer: Self-pay | Admitting: Hematology and Oncology

## 2019-01-24 DIAGNOSIS — C562 Malignant neoplasm of left ovary: Secondary | ICD-10-CM

## 2019-01-27 ENCOUNTER — Telehealth: Payer: Self-pay

## 2019-01-27 NOTE — Telephone Encounter (Signed)
Oral Oncology Patient Advocate Encounter  Was successful in securing patient an $ 4000 grant from [employer] (PAF) to provide copayment coverage for her Rubraca.  This will keep the out of pocket expense at $0.    I have spoken with the patient.    The billing information is as follows and has been shared with [facility].   Member ID: [id number] Group ID: [id number] RxBin: [id number] Dates of Eligibility: 01/24/19 through 01/24/20  [facility] [place] [facility] [place] Phone [phone] Fax [phone] 01/27/2019    11:52 AM

## 2019-02-03 DIAGNOSIS — L608 Other nail disorders: Secondary | ICD-10-CM

## 2019-02-03 HISTORY — DX: Other nail disorders: L60.8

## 2019-02-06 MED FILL — RUBRACA 300 MG TAB: 300 | 30 days supply | Qty: 60 | Fill #0

## 2019-02-13 ENCOUNTER — Inpatient Hospital Stay: Payer: Medicare HMO

## 2019-02-13 ENCOUNTER — Inpatient Hospital Stay: Payer: Medicare HMO | Admitting: Hematology and Oncology

## 2019-02-13 ENCOUNTER — Encounter: Payer: Self-pay | Admitting: Hematology and Oncology

## 2019-02-13 ENCOUNTER — Other Ambulatory Visit: Payer: Self-pay

## 2019-02-13 DIAGNOSIS — C562 Malignant neoplasm of left ovary: Secondary | ICD-10-CM

## 2019-02-13 DIAGNOSIS — I700 Atherosclerosis of aorta: Secondary | ICD-10-CM

## 2019-02-13 DIAGNOSIS — G620 Drug-induced polyneuropathy: Secondary | ICD-10-CM

## 2019-02-13 DIAGNOSIS — Z9221 Personal history of antineoplastic chemotherapy: Secondary | ICD-10-CM

## 2019-02-13 DIAGNOSIS — E039 Hypothyroidism, unspecified: Secondary | ICD-10-CM | POA: Diagnosis not present

## 2019-02-13 DIAGNOSIS — Z79899 Other long term (current) drug therapy: Secondary | ICD-10-CM

## 2019-02-13 DIAGNOSIS — E1151 Type 2 diabetes mellitus with diabetic peripheral angiopathy without gangrene: Secondary | ICD-10-CM | POA: Diagnosis not present

## 2019-02-13 DIAGNOSIS — E1165 Type 2 diabetes mellitus with hyperglycemia: Secondary | ICD-10-CM

## 2019-02-13 DIAGNOSIS — T451X5A Adverse effect of antineoplastic and immunosuppressive drugs, initial encounter: Secondary | ICD-10-CM

## 2019-02-13 DIAGNOSIS — I10 Essential (primary) hypertension: Secondary | ICD-10-CM

## 2019-02-13 DIAGNOSIS — Z7984 Long term (current) use of oral hypoglycemic drugs: Secondary | ICD-10-CM

## 2019-02-13 DIAGNOSIS — R269 Unspecified abnormalities of gait and mobility: Secondary | ICD-10-CM

## 2019-02-13 DIAGNOSIS — Z923 Personal history of irradiation: Secondary | ICD-10-CM

## 2019-02-13 LAB — CBC WITH DIFFERENTIAL (CANCER CENTER ONLY)
Abs Immature Granulocytes: 0.03 10*3/uL (ref 0.00–0.07)
Basophils Absolute: 0.1 10*3/uL (ref 0.0–0.1)
Basophils Relative: 1 %
Eosinophils Absolute: 0.1 10*3/uL (ref 0.0–0.5)
Eosinophils Relative: 2 %
HCT: 36.3 % (ref 36.0–46.0)
Hemoglobin: 11.9 g/dL — ABNORMAL LOW (ref 12.0–15.0)
Immature Granulocytes: 1 %
Lymphocytes Relative: 28 %
Lymphs Abs: 1.9 10*3/uL (ref 0.7–4.0)
MCH: 32.7 pg (ref 26.0–34.0)
MCHC: 32.8 g/dL (ref 30.0–36.0)
MCV: 99.7 fL (ref 80.0–100.0)
Monocytes Absolute: 0.4 10*3/uL (ref 0.1–1.0)
Monocytes Relative: 7 %
Neutro Abs: 4.1 10*3/uL (ref 1.7–7.7)
Neutrophils Relative %: 61 %
Platelet Count: 190 10*3/uL (ref 150–400)
RBC: 3.64 MIL/uL — ABNORMAL LOW (ref 3.87–5.11)
RDW: 14.6 % (ref 11.5–15.5)
WBC Count: 6.6 10*3/uL (ref 4.0–10.5)
nRBC: 0 % (ref 0.0–0.2)

## 2019-02-13 LAB — CMP (CANCER CENTER ONLY)
ALT: 23 U/L (ref 0–44)
AST: 26 U/L (ref 15–41)
Albumin: 4.1 g/dL (ref 3.5–5.0)
Alkaline Phosphatase: 60 U/L (ref 38–126)
Anion gap: 12 (ref 5–15)
BUN: 25 mg/dL — ABNORMAL HIGH (ref 8–23)
CO2: 23 mmol/L (ref 22–32)
Calcium: 10.1 mg/dL (ref 8.9–10.3)
Chloride: 105 mmol/L (ref 98–111)
Creatinine: 0.93 mg/dL (ref 0.44–1.00)
GFR, Est AFR Am: >60 mL/min (ref >60)
GFR, Estimated: 59 mL/min — ABNORMAL LOW (ref >60)
Glucose, Bld: 107 mg/dL — ABNORMAL HIGH (ref 70–99)
Potassium: 3.8 mmol/L (ref 3.5–5.1)
Sodium: 140 mmol/L (ref 135–145)
Total Bilirubin: 0.8 mg/dL (ref 0.3–1.2)
Total Protein: 7.1 g/dL (ref 6.5–8.1)

## 2019-02-13 MED ORDER — LEVOTHYROXINE SODIUM 112 MCG PO TABS: 112.0000 ug | ORAL_TABLET | Freq: Every day | ORAL | Status: DC

## 2019-02-13 MED ORDER — HEPARIN SOD (PORK) LOCK FLUSH 100 UNIT/ML IV SOLN
500.0000 [IU] | Freq: Once | INTRAVENOUS | Status: AC
  Administered 2019-02-13: 500 [IU]
  Filled 2019-02-13: qty 5

## 2019-02-13 MED ORDER — SODIUM CHLORIDE 0.9% FLUSH
10.0000 mL | Freq: Once | INTRAVENOUS | Status: AC
  Administered 2019-02-13: 10 mL
  Filled 2019-02-13: qty 10

## 2019-02-13 NOTE — Progress Notes (Signed)
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  Diabetes mellitus with peripheral circulatory disorder ([place]) She continues to have intermittent elevated blood sugar She is wondering whether her metformin dose need to be adjusted I will defer to her primary care doctor for further management   No orders of the defined types were placed in this encounter.   INTERVAL HISTORY: Please see below for problem oriented charting. 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 - TUMOR INVOLVES OVARY SURFACE AND LEFT FALLOPIAN TUBE. - SEE ONCOLOGY TABLE. 2. Mesentery, small bowel mesentery biopsy #1 - BENIGN FIBROADIPOSE TISSUE. 3. Ovary and fallopian tube, right - BENIGN OVARY WITH INCLUSION CYSTS. - BENIGN FALLOPIAN TUBE WITH PARATUBAL CYSTS AND ADENOFIBROMA. 4. Omentum, resection for tumor - BENIGN ADIPOSE TISSUE. 5. Peritoneum, biopsy, left diaphragmatic - BENIGN PERITONEAL TYPE TISSUE. 6. Peritoneum, biopsy, right diaphragmatic - BENIGN PERITONEAL TYPE TISSUE. 7. Mesentery, small bowel mesenteric biopsy #2 - BENIGN FIBROADIPOSE TISSUE. 8. Lymph nodes, regional resection, right pelvic - FIVE OF FIVE LYMPH NODES NEGATIVE FOR CARCINOMA (0/5). 9. Lymph nodes, regional resection, left pelvic - FOUR OF FOUR LYMPH NODES NEGATIVE FOR CARCINOMA (0/4). 10. Peritoneum, biopsy, left gutter - BENIGN PERITONEAL TYPE TISSUE. 11. Peritoneum, biopsy, bladder - BENIGN PERITONEAL TYPE TISSUE WITH ACUTE INFLAMMATION AND CALCIFICATIONS.  12. Peritoneum, biopsy, cul-de-sac - BENIGN PERITONEAL TYPE TISSUE WITH ACUTE INFLAMMATION AND CALCIFICATIONS. 13. Soft tissue, biopsy, right gutter - BENIGN FIBROADIPOSE TISSUE. 14. Lymph node, biopsy, right para-aortic - TWO OF TWO LYMPH NODES NEGATIVE FOR CARCINOMA (0/2). 15. Lymph node, biopsy, left para-aortic - ONE OF ONE LYMPH NODES NEGATIVE FOR CARCINOMA (0/1). Microscopic Comment 1. OVARY Specimen(s): Left ovary and fallopian tube.  Procedure: (including lymph node sampling): Bilateral  salpingo-oophorectomy with omental and peritoneal biopsies and lymph node biopsies. Primary tumor site (including laterality): Left ovary. Ovarian surface involvement: Present. Ovarian capsule intact without fragmentation: Intact. Maximum tumor size (cm): 11 cm total. Histologic type: Serous cystadenocarcinoma. Grade: High grade (low grade areas also present). Peritoneal implants: (specify invasive or non-invasive): N/A. Pelvic extension (list additional structures on separate lines and if involved): Left fallopian tube. Lymph nodes: number examined 12 ; number positive 0 TNM code: pT2a, pN0, pMX FIGO Stage (based on pathologic findings, needs clinical correlation): IIA Comments: None.    01/31/2018 Pathology Results    PERITONEAL WASHING (SPECIMEN 1 OF 1 COLLECTED 01/31/18): MALIGNANT CELLS PRESENT CONSISTENT WITH METASTATIC CARCINOMA.    01/31/2018 Surgery    Procedure(s) Performed:  1. Robotic BSO and washings. 2. Exploratory laparotomy, Staging including infragastric omentectomy, Bilateral pelvic and paraaortic lymphadenectomy, pertioneal biopsies.  Specimens: Bilateral tubes / ovaries, bilateral pelvic and paraaortic lymph nodes, peritoneal biopsies, washings and omentum.  Operative Findings: The left adnexa was adherent to the left pelvic peritoneum suspected due to the inferior retraction from the vaginal hysterectomy. Intraoperative leakage of cystic fluid from left ovary.  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  The right adnexa grossly appeared normal although slightly enlarged for her age.  There were some indurated lymph nodes but no overtly malignant lymph nodes were suspected.  Small bowel mesentery had 3-4 superficial possible implants.  1 of these was sent for frozen section returned benign.  She did have adhesive disease from her open cholecystectomy in the right upper quadrant.  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 16 mm exophytic lesion upper pole right kidney has attenuation too high to be a simple cyst. This may be a cyst complicated by proteinaceous debris or hemorrhage, but renal cell carcinoma is a concern. Routine outpatient follow-up MRI of the abdomen without and with contrast recommended after resolution of patient's acute symptoms (so she is better able to participate with positioning and breath holding). 2. Mild edema/possible minimal hemorrhage in the extraperitoneal soft tissues of the pelvic for tracking up both pelvic sidewalls. Imaging appearance is not outside of the spectrum of findings expected 6 days after the reported surgery. Trace interloop mesenteric and free fluid is identified in the peritoneal cavity in there is some mild edema in the omentum. There is no organized or rim enhancing collection to suggest evolving abscess. 3. Gas in the extraperitoneal soft tissues of the left lower quadrant is associated with gas in the subcutaneous fat of the lower left anterior abdominal wall. This is not unexpected on postoperative day 6. No evidence for intraperitoneal free air. 4. No CT features to suggest small bowel obstruction. Oral contrast material has migrated about halfway through the small bowel loops but there is no differential distention of proximal versus distal small bowel. The colon is diffusely distended and fluid-filled proximally, but formed stool is noted in the distal colon. Given apparent slow migration of contrast and fluid-filled small and large  bowel loops, a component of ileus is  possible.    02/14/2018 Imaging    MR abdomen Benign Bosniak category 2 cyst in upper pole of right kidney, corresponding with lesion seen on previous CT. No evidence of renal neoplasm or other significant abnormality.     02/18/2018 Cancer Staging    Staging form: Ovary, Fallopian Tube, and Primary Peritoneal Carcinoma, AJCC 8th Edition - Pathologic: Stage II (pT2, pN0, cM0) - Signed by Heath Lark, MD on 02/18/2018    02/28/2018 Procedure    Status post right IJ port catheter placement. 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 For last cycle, taxol was omitted    07/18/2018 Imaging    1. No evidence for residual or recurrent tumor within the abdomen or pelvis. There has been interval resolution edema and fluid within the peritoneal cavity. 2. New pulmonary nodule is identified within the right lower lobe measuring 1.1 cm, image 87/4. Consider one of the following in 3 months for both low-risk and high-risk individuals: (a) repeat chest CT, (b) follow-up PET-CT, or (c) tissue sampling. This recommendation follows the consensus statement: Guidelines for Management of Incidental Pulmonary Nodules Detected on CT Images: From the Fleischner Society 2017; Radiology 2017; 284:228-243. 3. Stable 7 mm nodule within the right upper lobe. 4. Aortic Atherosclerosis (ICD10-I70.0). 5. Multi vessel coronary artery atherosclerotic calcifications.    07/18/2018 Tumor Marker    Patient's tumor was tested for the following markers: CA-125 Results of the tumor marker test revealed 14.9    07/25/2018 PET scan    1. Regressing right lower lobe peripheral lung lesion, likely resolving inflammatory or atelectatic process. No hypermetabolism to suggest malignancy. I  would recommend a follow-up noncontrast chest CT and 4-6 months. Small upper lobe pulmonary nodules are stable. 2. 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    07/31/2018 -  Chemotherapy    The patient started taking rucaparib    10/25/2018 Tumor Marker    Patient's tumor was tested for the following markers: CA-125 Results of the tumor marker test revealed 12.1    11/25/2018 Imaging    1. Right lower lobe nodule has resolved. Additional right lung nodules are stable. 2. Stable hazy nodularity along the left external iliac chain.  3. Hepatomegaly. 4. Aortic atherosclerosis (ICD10-170.0). Coronary artery calcification. 5. Enlarged pulmonic trunk, indicative of pulmonary arterial hypertension.    01/16/2019 Tumor Marker    Patient's tumor was tested for the following markers: CA-125 Results of the tumor marker test revealed 13.1     REVIEW OF SYSTEMS:   Constitutional: Denies fevers, chills or abnormal weight loss Eyes: Denies blurriness of vision Ears, nose, mouth, throat, and face: Denies mucositis or sore throat Respiratory: Denies cough, dyspnea or wheezes Cardiovascular: Denies palpitation, chest discomfort or lower extremity swelling Gastrointestinal:  Denies nausea, heartburn or change in bowel habits Skin: Denies abnormal skin rashes Lymphatics: Denies new lymphadenopathy or easy bruising Neurological:Denies numbness, tingling or new weaknesses Behavioral/Psych: Mood is stable, no new changes  All other systems were reviewed with the patient and are negative.  I have reviewed the past medical history, past surgical history, social history and family history  with the patient and they are unchanged from previous note.  ALLERGIES:  is allergic to penicillins.  MEDICATIONS:  Current Outpatient Medications  Medication Sig Dispense Refill  . Cyanocobalamin (B-12 COMPLIANCE  INJECTION) 1000 MCG/ML KIT Inject 1,000 mcg as directed every 30 (thirty) days.    Marland Kitchen FLUoxetine (PROZAC) 20 MG capsule Take 30 mg by mouth daily after breakfast.     . hydrochlorothiazide (HYDRODIURIL) 25 MG tablet Take 25 mg by mouth daily.    Marland Kitchen HYDROcodone-acetaminophen (NORCO) 10-325 MG tablet Take 1 tablet by mouth every 6 (six) hours as needed. 60 tablet 0  . levothyroxine (SYNTHROID) 112 MCG tablet Take 1 tablet (112 mcg total) by mouth daily before breakfast.    . lidocaine-prilocaine (EMLA) cream Apply to affected area once 30 g 3  . losartan (COZAAR) 100 MG tablet Take 100 mg by mouth at bedtime.     . Melatonin 5 MG TABS Take 5-10 mg by mouth at bedtime as needed (sleep).     . metFORMIN (GLUCOPHAGE-XR) 500 MG 24 hr tablet Take 1,000 mg by mouth 2 (two) times daily.    . rucaparib camsylate (RUBRACA) 300 MG tablet Take 1 tablet (300 mg total) by mouth 2 (two) times daily. 60 tablet 11   No current facility-administered medications for this visit.     PHYSICAL EXAMINATION: ECOG PERFORMANCE STATUS: 1 - Symptomatic but completely ambulatory  Vitals:   02/13/19 1048  BP: (!) 129/55  Pulse: 80  Resp: 17  Temp: 98.3 F (36.8 C)  SpO2: 99%   Filed Weights   02/13/19 1048  Weight: 161 lb 14.4 oz (73.4 kg)    GENERAL:alert, no distress and comfortable SKIN: skin color, texture, turgor are normal, no rashes or significant lesions EYES: normal, Conjunctiva are pink and non-injected, sclera clear OROPHARYNX:no exudate, no erythema and lips, buccal mucosa, and tongue normal  NECK: supple, thyroid normal size, non-tender, without nodularity LYMPH:  no palpable lymphadenopathy in the cervical, axillary or inguinal LUNGS: clear to auscultation and percussion with normal breathing effort HEART: regular rate & rhythm and no murmurs and no lower extremity edema ABDOMEN:abdomen soft, non-tender and normal bowel sounds Musculoskeletal:no cyanosis of digits and no clubbing  NEURO: alert  & oriented x 3 with fluent speech, no focal motor/sensory deficits  LABORATORY DATA:  I have reviewed the data as listed    Component Value Date/Time   NA 140 02/13/2019 1024   K 3.8 02/13/2019 1024   CL 105 02/13/2019 1024   CO2 23 02/13/2019 1024   GLUCOSE 107 (H) 02/13/2019 1024   BUN 25 (H) 02/13/2019 1024   CREATININE 0.93 02/13/2019 1024   CALCIUM 10.1 02/13/2019 1024   PROT 7.1 02/13/2019 1024   ALBUMIN 4.1 02/13/2019 1024   AST 26 02/13/2019 1024   ALT 23 02/13/2019 1024   ALKPHOS 60 02/13/2019 1024   BILITOT 0.8 02/13/2019 1024   GFRNONAA 59 (L) 02/13/2019 1024   GFRAA >60 02/13/2019 1024    No results found for: SPEP, UPEP  Lab Results  Component Value Date   WBC 6.6 02/13/2019   NEUTROABS 4.1 02/13/2019   HGB 11.9 (L) 02/13/2019   HCT 36.3 02/13/2019   MCV 99.7 02/13/2019   PLT 190 02/13/2019      Chemistry      Component Value Date/Time   NA 140 02/13/2019 1024   K 3.8 02/13/2019 1024   CL 105 02/13/2019 1024   CO2 23 02/13/2019 1024   BUN 25 (H) 02/13/2019 1024  CREATININE 0.93 02/13/2019 1024      Component Value Date/Time   CALCIUM 10.1 02/13/2019 1024   ALKPHOS 60 02/13/2019 1024   AST 26 02/13/2019 1024   ALT 23 02/13/2019 1024   BILITOT 0.8 02/13/2019 1024      All questions were answered. The patient knows to call the clinic with any problems, questions or concerns. No barriers to learning was detected.  I spent 15 minutes counseling the patient face to face. The total time spent in the appointment was 20 minutes and more than 50% was on counseling and review of test results  Heath Lark, MD 02/13/2019 1:21 PM

## 2019-02-13 NOTE — Assessment & Plan Note (Signed)
She continues to have mild residual peripheral neuropathy from treatment it flares up from time to time The patient declined prescription gabapentin because of intolerance side effects She has taken some periodic pain medicine as needed for flare of neuropathy.

## 2019-02-13 NOTE — Assessment & Plan Note (Signed)
She continues to have intermittent elevated blood sugar She is wondering whether her metformin dose need to be adjusted I will defer to her primary care doctor for further management

## 2019-02-13 NOTE — Assessment & Plan Note (Signed)
She has close follow-up appointment with her primary care doctor for medication adjustment of her thyroid medicine She will continue the same

## 2019-02-13 NOTE — Assessment & Plan Note (Signed)
Overall, she tolerated reduced dose chemotherapy well She will continue the same We will continue serial tumor marker monitoring; her recent tumor marker is stable I recommend we continue the same at reduced dose She is scheduled to see GYN oncologist in the next week, approximately 8 weeks from now with port flush and blood work I will see her back the following visit.

## 2019-02-14 ENCOUNTER — Telehealth: Payer: Self-pay | Admitting: Oncology

## 2019-02-14 NOTE — Telephone Encounter (Signed)
Called [name] and advised her of appointment to see Dr. Skeet Latch on 03/27/19 at 11 am and that the schedulers will call her with lab/flush appointment for the same day.  Also discussed that she will see [name] in August.  She verbalized understanding and agreement.

## 2019-02-17 ENCOUNTER — Telehealth: Payer: Self-pay | Admitting: Oncology

## 2019-02-17 ENCOUNTER — Telehealth: Payer: Self-pay | Admitting: *Deleted

## 2019-02-17 NOTE — Telephone Encounter (Signed)
Left a message for [name] advising her that her labs should be released to MyChart in the next few days per [name].

## 2019-02-17 NOTE — Telephone Encounter (Signed)
Called and gave the patient the appts for 6/11 and 8/6. Patient requested that a message be sent to [name] to "please release her labs from 4/30."

## 2019-02-17 NOTE — Telephone Encounter (Signed)
I have no control over mychart;  Most patients will get labs after a few days

## 2019-03-18 MED FILL — RUBRACA 300 MG TAB: 300 | 30 days supply | Qty: 60 | Fill #1

## 2019-03-25 NOTE — Progress Notes (Signed)
Dawn at [facility]   Progress Note : Established Patient FOLLOW-UP  Consult was initally requested by Dr. Matthew Saras   CC:  Chief Complaint  Patient presents with  . Malignant neoplasm of left ovary (HCC)    Follow up    HPI: [name] is a 78 y.o.  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 - TUMOR INVOLVES OVARY SURFACE AND LEFT FALLOPIAN TUBE. - SEE ONCOLOGY TABLE. 2. Mesentery, small bowel mesentery biopsy #1 - BENIGN FIBROADIPOSE TISSUE. 3. Ovary and fallopian tube, right - BENIGN OVARY WITH INCLUSION CYSTS. - BENIGN FALLOPIAN TUBE WITH PARATUBAL CYSTS AND ADENOFIBROMA. 4. Omentum, resection for tumor - BENIGN ADIPOSE TISSUE. 5. Peritoneum, biopsy, left diaphragmatic - BENIGN PERITONEAL TYPE TISSUE. 6. Peritoneum, biopsy, right diaphragmatic - BENIGN PERITONEAL TYPE TISSUE. 7. Mesentery, small bowel mesenteric biopsy #2 - BENIGN FIBROADIPOSE TISSUE. 8. Lymph nodes, regional resection, right pelvic - FIVE OF FIVE LYMPH NODES NEGATIVE FOR CARCINOMA (0/5). 9. Lymph nodes, regional resection, left pelvic - FOUR OF FOUR LYMPH NODES NEGATIVE FOR CARCINOMA (0/4). 10. Peritoneum, biopsy, left gutter - BENIGN PERITONEAL TYPE TISSUE. 11. Peritoneum, biopsy, bladder - BENIGN PERITONEAL TYPE TISSUE WITH ACUTE INFLAMMATION AND CALCIFICATIONS.  12. Peritoneum, biopsy, cul-de-sac - BENIGN PERITONEAL TYPE TISSUE WITH ACUTE INFLAMMATION AND CALCIFICATIONS. 13. Soft tissue, biopsy, right gutter - BENIGN FIBROADIPOSE TISSUE. 14. Lymph node, biopsy, right para-aortic - TWO OF TWO LYMPH  NODES NEGATIVE FOR CARCINOMA (0/2). 15. Lymph node, biopsy, left para-aortic - ONE OF ONE LYMPH NODES NEGATIVE FOR CARCINOMA (0/1). Microscopic Comment 1. OVARY Specimen(s): Left ovary and fallopian tube. Procedure: (including lymph node sampling): Bilateral salpingo-oophorectomy with omental and peritoneal biopsies and lymph node biopsies. Primary tumor site (including laterality): Left ovary. Ovarian surface involvement: Present. Ovarian capsule intact without fragmentation: Intact. Maximum tumor size (cm): 11 cm total. Histologic type: Serous cystadenocarcinoma. Grade: High grade (low grade areas also present). Peritoneal implants: (specify invasive or non-invasive): N/A. Pelvic extension (list additional structures on separate lines and if involved): Left fallopian tube. Lymph nodes: number examined 12 ; number positive 0 TNM code: pT2a, pN0, pMX FIGO Stage (based on pathologic findings, needs clinical correlation): IIA Comments: None.   01/31/2018 Pathology Results   PERITONEAL WASHING (SPECIMEN 1 OF 1 COLLECTED 01/31/18): MALIGNANT CELLS PRESENT CONSISTENT WITH METASTATIC CARCINOMA.   01/31/2018 Surgery   Procedure(s) Performed:  1. Robotic BSO and washings. 2. Exploratory laparotomy, Staging including infragastric omentectomy, Bilateral pelvic and paraaortic lymphadenectomy, pertioneal biopsies.  Specimens: Bilateral tubes / ovaries, bilateral pelvic and paraaortic lymph nodes, peritoneal biopsies, washings and omentum.  Operative Findings: The left adnexa was adherent to the left pelvic peritoneum suspected due to the inferior retraction from the vaginal hysterectomy. Intraoperative leakage of cystic fluid from left ovary.  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  The right adnexa grossly appeared normal although slightly enlarged for her age.  There were some indurated lymph nodes but no overtly malignant lymph nodes were  suspected.  Small bowel mesentery had 3-4 superficial possible implants.  1 of these was sent for frozen section returned benign.  She did have adhesive disease from her open cholecystectomy in the right upper quadrant.  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 16 mm exophytic lesion upper pole right kidney has attenuation too high to be a simple cyst. This may be a cyst complicated by proteinaceous debris or hemorrhage, but renal cell carcinoma is a concern. Routine outpatient follow-up MRI of the abdomen without and with contrast recommended after resolution of patient's acute symptoms (so she is better able to participate with positioning and breath holding). 2. Mild edema/possible minimal hemorrhage in the extraperitoneal soft tissues of the pelvic for tracking up both pelvic sidewalls. Imaging appearance is not outside of the spectrum of findings expected 6 days after the reported surgery. Trace interloop mesenteric and free fluid is identified in the peritoneal cavity in there is some mild edema in the omentum. There is no organized or rim enhancing collection to suggest evolving abscess. 3. Gas in the extraperitoneal soft tissues of the left lower quadrant is associated with gas in the subcutaneous fat of the lower left anterior abdominal wall. This is not unexpected on postoperative day 6. No evidence for intraperitoneal free air. 4. No CT features to suggest small bowel obstruction. Oral contrast material has migrated about halfway through the small bowel loops but there is no differential distention  of proximal versus distal small bowel. The colon is diffusely distended and fluid-filled proximally, but formed stool is noted in the distal colon. Given apparent slow migration of contrast and fluid-filled small and large bowel loops, a component of ileus is possible.   02/14/2018 Imaging   MR abdomen Benign Bosniak category 2 cyst in upper pole of right kidney, corresponding with lesion seen on previous CT. No evidence of renal neoplasm or other significant abnormality.    02/18/2018 Cancer Staging   Staging form: Ovary, Fallopian Tube, and Primary Peritoneal Carcinoma, AJCC 8th Edition - Pathologic: Stage II (pT2, pN0, cM0) - Signed by Heath Lark, MD on 02/18/2018   02/28/2018 Procedure   Status post right IJ port catheter placement. 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 For last cycle, taxol was omitted   07/18/2018 Imaging   1. No evidence for residual or recurrent tumor within the abdomen or pelvis. There has been interval resolution edema and fluid within the peritoneal cavity. 2. New pulmonary nodule is identified within the right lower lobe measuring 1.1 cm, image 87/4. Consider one of the following in 3 months for both low-risk and high-risk individuals: (a) repeat chest CT, (b) follow-up PET-CT, or (c) tissue sampling. This recommendation follows the consensus statement: Guidelines for Management of Incidental Pulmonary Nodules Detected on CT Images: From the Fleischner Society 2017; Radiology 2017; 284:228-243. 3. Stable 7 mm nodule within the right upper lobe. 4. Aortic Atherosclerosis (ICD10-I70.0). 5. Multi vessel coronary artery atherosclerotic calcifications.   07/18/2018 Tumor  Marker   Patient's tumor was tested for the following  markers: CA-125 Results of the tumor marker test revealed 14.9   07/25/2018 PET scan   1. Regressing right lower lobe peripheral lung lesion, likely resolving inflammatory or atelectatic process. No hypermetabolism to suggest malignancy. I would recommend a follow-up noncontrast chest CT and 4-6 months. Small upper lobe pulmonary nodules are stable. 2. 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   07/31/2018 -  Chemotherapy   The patient started taking rucaparib   10/25/2018 Tumor Marker   Patient's tumor was tested for the following markers: CA-125 Results of the tumor marker test revealed 12.1   11/25/2018 Imaging   1. Right lower lobe nodule has resolved. Additional right lung nodules are stable. 2. Stable hazy nodularity along the left external iliac chain.  3. Hepatomegaly. 4. Aortic atherosclerosis (ICD10-170.0). Coronary artery calcification. 5. Enlarged pulmonic trunk, indicative of pulmonary arterial hypertension.   01/16/2019 Tumor Marker   Patient's tumor was tested for the following markers: CA-125 Results of the tumor marker test revealed 13.1       Interval History PET imaging was done and the overall sense was the pulmonary lesion was not disease.  She was started on a PARP (Rucaparib) inhibitor 08/2018. Some dose adjustments/hold but overall tolerating well.  Fatigue, constipation and well being have improved since management of hypothyroidism.  End of treatment CT Scan was ordered by [name] and there is a new pulmonary lesion in the RLL  Measurement of disease Imaging (CA125 not elevated preop but being followed)   Recent Labs    07/18/18 0938 08/13/18 1245 08/20/18 1051 08/27/18 1035 09/03/18 1010 09/17/18 1033 10/25/18 1100 01/16/19 0921  CAN125 14.9 13.3 13.1 12.7 13.3 12.6 12.1 13.1   CA125 was not elevated at the time of diagnosis  Current  Meds:  Outpatient Encounter Medications as of 03/27/2019  Medication Sig  . Cyanocobalamin (B-12 COMPLIANCE INJECTION) 1000 MCG/ML KIT Inject 1,000 mcg as directed every 30 (thirty) days.  Marland Kitchen FLUoxetine (PROZAC) 20 MG capsule Take 30 mg by mouth daily after breakfast.   . hydrochlorothiazide (HYDRODIURIL) 25 MG tablet Take 25 mg by mouth daily.  Marland Kitchen HYDROcodone-acetaminophen (NORCO) 10-325 MG tablet Take 1 tablet by mouth every 6 (six) hours as needed.  Marland Kitchen levothyroxine (SYNTHROID) 112 MCG tablet Take 1 tablet (112 mcg total) by mouth daily before breakfast.  . lidocaine-prilocaine (EMLA) cream Apply to affected area once  . losartan (COZAAR) 100 MG tablet Take 100 mg by mouth at bedtime.   . Melatonin 5 MG TABS Take 5-10 mg by mouth at bedtime as needed (sleep).   . metFORMIN (GLUCOPHAGE-XR) 500 MG 24 hr tablet Take 1,000 mg by mouth 2 (two) times daily.  . rucaparib camsylate (RUBRACA) 300 MG tablet Take 1 tablet (300 mg total) by mouth 2 (two) times daily.   No facility-administered encounter medications on file as of 03/27/2019.     Allergy:  Allergies  Allergen Reactions  . Penicillins Anaphylaxis    Has patient had a PCN reaction causing immediate rash, facial/tongue/throat swelling, SOB or lightheadedness with hypotension: Yes Has patient had a PCN reaction causing severe rash involving mucus membranes or skin necrosis: No Has patient had a PCN reaction that required hospitalization: Yes Has patient had a PCN reaction occurring within the last 10 years: No If all of the above answers are "NO", then may proceed with Cephalosporin use.  Social Hx:   Social History   Socioeconomic History  . Marital status: Widowed    Spouse name: Not on file  . Number of children: 2  . Years of education: Not on file  . Highest education level: Not on file  Occupational History  . Occupation: retired Glass blower/designer  Social Needs  . Financial resource strain: Not on file  . Food insecurity:     Worry: Not on file    Inability: Not on file  . Transportation needs:    Medical: Not on file    Non-medical: Not on file  Tobacco Use  . Smoking status: Never Smoker  . Smokeless tobacco: Never Used  Substance and Sexual Activity  . Alcohol use: No  . Drug use: No  . Sexual activity: Not on file  Lifestyle  . Physical activity:    Days per week: Not on file    Minutes per session: Not on file  . Stress: Not on file  Relationships  . Social connections:    Talks on phone: Not on file    Gets together: Not on file    Attends religious service: Not on file    Active member of club or organization: Not on file    Attends meetings of clubs or organizations: Not on file    Relationship status: Not on file  . Intimate partner violence:    Fear of current or ex partner: Not on file    Emotionally abused: Not on file    Physically abused: Not on file    Forced sexual activity: Not on file  Other Topics Concern  . Not on file  Social History Narrative  . Not on file  Reports daily use of J&J talcum powder from the 1960s until her hysterectomy in 1984.  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  Past Surgical Hx:  Past Surgical History:  Procedure Laterality Date  . BLADDER SUSPENSION  2002  . BREAST SURGERY     several benign cysts removed; surgeries from [place]   . CHOLECYSTECTOMY  1988  . EYE SURGERY  2010 amd 2012   cataracts removed bilateral   . IR FLUORO GUIDE PORT INSERTION RIGHT  02/28/2018  . IR US GUIDE VASC ACCESS RIGHT  02/28/2018  . KNEE ARTHROSCOPY Left    l knee x2  . PARATHYROIDECTOMY  2010  . ROBOTIC ASSISTED SALPINGO OOPHERECTOMY Bilateral 01/31/2018   Procedure: XI ROBOTIC ASSISTED BILATERAL SALPINGO OOPHORECTOMY, STAGING, LAPAROTOMY, PELVIC AND PARA AORTIC LYMPH NODE DISSECTION, OMENTECTOMY;  Surgeon: Isabel Caprice, MD;  Location: [facility];  Service: Gynecology;  Laterality: Bilateral;  . ROTATOR CUFF REPAIR  2005   right   .  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 Arthritis   . Diabetes mellitus without complication ([place])    type 2   . GERD (gastroesophageal reflux disease)   . Hypertension   . Hypothyroidism   . Ovarian cancer ([place]) 01/31/2018  . Pneumonia    its been 4 years   . PONV (postoperative nausea and vomiting)   . Vitamin B 12 deficiency     Past Gynecological History: 2 prior to hysterectomy in 1984 received estrogen therapy from 1984 until 2000 No LMP recorded.  Patient has had a hysterectomy.  Family Hx:  Family History  Problem Relation Age of Onset  . Lymphoma Mother 53  . Bladder Cancer Sister 72  . Prostate cancer Brother 65       has surgery right away after dx  . Leukemia Brother        30's  . Breast cancer Other 80       niece, GT 2017 reportedly neg  . Prostate cancer Father 36       found on autopsy at death (31)  . Lung cancer Maternal Aunt   . Esophageal cancer Maternal Uncle   . Other Maternal Grandfather 75       cerebral hemmhorage  . Lung cancer Cousin   . Lung cancer Cousin   . Leukemia Cousin     Review of Systems - Oncology Vitals:  Blood pressure (!) 141/60, pulse 68, temperature 98.9 F (37.2 C), temperature source Oral, resp. rate 17, height 5' 4"  (1.626 m), weight 164 lb 6.4 oz (74.6 kg), SpO2 100 %.  Wt Readings from Last 3 Encounters:  03/27/19 164 lb 6.4 oz (74.6 kg)  02/13/19 161 lb 14.4 oz (73.4 kg)  12/26/18 162 lb (73.5 kg)    Physical Exam: General :  Well developed, 78 y.o., female in no apparent distress HEENT:  Normocephalic/atraumatic, symmetric, EOMI, eyelids normal Neck:   Supple, no masses.  Lymphatics:  No cervical/ submandibular/ supraclavicular/ infraclavicular/ inguinal adenopathy Respiratory:   Respirations unlabored, no use of accessory muscles Musculoskeletal: No CVA tenderness, normal muscle strength. Abdomen:  Soft, non-tender and nondistended. No evidence of hernia. No masses. Extremities:  No lymphedema, no erythema, non-tender. Skin:   Normal inspection Neuro/Psych:  No focal motor deficit, no abnormal mental status. Normal gait. Normal affect. Alert and oriented to person, place, and time  Pelvic: Vulva: Normal external female genitalia.  Bladder/urethra: Urethral meatus normal in size and location. No lesions or   masses, moderately supported bladder. Some atrophy Speculum exam:No masses or cul de sac nodularity. no lesions Bimanual exam:  Uterus: Surgically absent  Adnexa: No masses. Rectovaginal:  No masses, normal tone.    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 Pulmonary lesion - per [name] observation for now 2. Continue to follow with medical oncology for PARP therapy RTC 3 months 4.  CT C/A/P in 06/2019 - CA 125 is ot a marker F/U with Gyn Onc 06/2019 5.  Continue rucaparib.  Feels better since treatment of hypothyroidism.    Cc: Molli Posey, MD Referring OB/Gyn Gilford Rile, MD Harmon Dun PCP

## 2019-03-27 ENCOUNTER — Inpatient Hospital Stay: Payer: Medicare HMO

## 2019-03-27 ENCOUNTER — Other Ambulatory Visit: Payer: Self-pay

## 2019-03-27 ENCOUNTER — Encounter: Payer: Self-pay | Admitting: Gynecologic Oncology

## 2019-03-27 ENCOUNTER — Inpatient Hospital Stay: Payer: Medicare HMO | Attending: Gynecologic Oncology | Admitting: Gynecologic Oncology

## 2019-03-27 ENCOUNTER — Other Ambulatory Visit: Payer: Self-pay | Admitting: Hematology and Oncology

## 2019-03-27 VITALS — BP 141/60 | HR 68 | Temp 98.9°F | Resp 17 | Ht 64.0 in | Wt 164.4 lb

## 2019-03-27 DIAGNOSIS — C562 Malignant neoplasm of left ovary: Secondary | ICD-10-CM

## 2019-03-27 DIAGNOSIS — Z9221 Personal history of antineoplastic chemotherapy: Secondary | ICD-10-CM | POA: Diagnosis not present

## 2019-03-27 DIAGNOSIS — Z79899 Other long term (current) drug therapy: Secondary | ICD-10-CM | POA: Diagnosis not present

## 2019-03-27 DIAGNOSIS — Z90710 Acquired absence of both cervix and uterus: Secondary | ICD-10-CM | POA: Diagnosis not present

## 2019-03-27 DIAGNOSIS — Z90722 Acquired absence of ovaries, bilateral: Secondary | ICD-10-CM

## 2019-03-27 LAB — CBC WITH DIFFERENTIAL/PLATELET
Abs Immature Granulocytes: 0.02 10*3/uL (ref 0.00–0.07)
Basophils Absolute: 0 10*3/uL (ref 0.0–0.1)
Basophils Relative: 1 %
Eosinophils Absolute: 0.1 10*3/uL (ref 0.0–0.5)
Eosinophils Relative: 2 %
HCT: 33.1 % — ABNORMAL LOW (ref 36.0–46.0)
Hemoglobin: 10.7 g/dL — ABNORMAL LOW (ref 12.0–15.0)
Immature Granulocytes: 0 %
Lymphocytes Relative: 38 %
Lymphs Abs: 2 10*3/uL (ref 0.7–4.0)
MCH: 32.3 pg (ref 26.0–34.0)
MCHC: 32.3 g/dL (ref 30.0–36.0)
MCV: 100 fL (ref 80.0–100.0)
Monocytes Absolute: 0.4 10*3/uL (ref 0.1–1.0)
Monocytes Relative: 7 %
Neutro Abs: 2.8 10*3/uL (ref 1.7–7.7)
Neutrophils Relative %: 52 %
Platelets: 180 10*3/uL (ref 150–400)
RBC: 3.31 MIL/uL — ABNORMAL LOW (ref 3.87–5.11)
RDW: 14.7 % (ref 11.5–15.5)
WBC: 5.4 10*3/uL (ref 4.0–10.5)
nRBC: 0 % (ref 0.0–0.2)

## 2019-03-27 LAB — COMPREHENSIVE METABOLIC PANEL
ALT: 14 U/L (ref 0–44)
AST: 15 U/L (ref 15–41)
Albumin: 3.8 g/dL (ref 3.5–5.0)
Alkaline Phosphatase: 59 U/L (ref 38–126)
Anion gap: 10 (ref 5–15)
BUN: 20 mg/dL (ref 8–23)
CO2: 24 mmol/L (ref 22–32)
Calcium: 9.3 mg/dL (ref 8.9–10.3)
Chloride: 106 mmol/L (ref 98–111)
Creatinine, Ser: 0.85 mg/dL (ref 0.44–1.00)
GFR calc Af Amer: >60 mL/min (ref >60)
GFR calc non Af Amer: >60 mL/min (ref >60)
Glucose, Bld: 92 mg/dL (ref 70–99)
Potassium: 4.2 mmol/L (ref 3.5–5.1)
Sodium: 140 mmol/L (ref 135–145)
Total Bilirubin: 0.5 mg/dL (ref 0.3–1.2)
Total Protein: 6.4 g/dL — ABNORMAL LOW (ref 6.5–8.1)

## 2019-03-27 MED ORDER — SODIUM CHLORIDE 0.9% FLUSH
10.0000 mL | Freq: Once | INTRAVENOUS | Status: AC
  Administered 2019-03-27: 10 mL
  Filled 2019-03-27: qty 10

## 2019-03-27 MED ORDER — HYDROCODONE-ACETAMINOPHEN 10-325 MG PO TABS: 1.0000 | ORAL_TABLET | Freq: Four times a day (QID) | ORAL | 0 refills | Status: DC | PRN

## 2019-03-27 MED ORDER — HEPARIN SOD (PORK) LOCK FLUSH 100 UNIT/ML IV SOLN
500.0000 [IU] | Freq: Once | INTRAVENOUS | Status: AC
  Administered 2019-03-27: 500 [IU]
  Filled 2019-03-27: qty 5

## 2019-03-27 NOTE — Patient Instructions (Addendum)
Plan to follow up in three months or sooner if needed with a CT scan prior to that visit.  Please call for any questions, concerns, or new, persistent symptoms such as abdominal bloating, feeling full quicker, abdominal pain, pelvic pain/pelvic, bleeding from your vagina/bladder/or rectum.

## 2019-03-27 NOTE — Patient Instructions (Signed)

## 2019-04-11 ENCOUNTER — Telehealth: Payer: Self-pay | Admitting: *Deleted

## 2019-04-11 NOTE — Telephone Encounter (Signed)
Faxed records to [facility]; release [phone]

## 2019-04-14 MED FILL — RUBRACA 300 MG TAB: 300 | 30 days supply | Qty: 60 | Fill #2

## 2019-04-23 ENCOUNTER — Telehealth: Payer: Self-pay | Admitting: Obstetrics

## 2019-04-23 NOTE — Telephone Encounter (Signed)
Faxed medical records (ovarian cancer operative report) to [facility] @ [phone]

## 2019-05-13 ENCOUNTER — Other Ambulatory Visit: Payer: Self-pay

## 2019-05-13 ENCOUNTER — Inpatient Hospital Stay: Payer: Medicare HMO | Attending: Gynecologic Oncology

## 2019-05-13 ENCOUNTER — Telehealth: Payer: Self-pay | Admitting: Oncology

## 2019-05-13 ENCOUNTER — Other Ambulatory Visit: Payer: Self-pay | Admitting: Hematology and Oncology

## 2019-05-13 DIAGNOSIS — N390 Urinary tract infection, site not specified: Secondary | ICD-10-CM

## 2019-05-13 DIAGNOSIS — R32 Unspecified urinary incontinence: Secondary | ICD-10-CM | POA: Insufficient documentation

## 2019-05-13 DIAGNOSIS — C562 Malignant neoplasm of left ovary: Secondary | ICD-10-CM | POA: Diagnosis not present

## 2019-05-13 HISTORY — DX: Urinary tract infection, site not specified: N39.0

## 2019-05-13 LAB — URINALYSIS, COMPLETE (UACMP) WITH MICROSCOPIC
Bacteria, UA: NONE SEEN
Bilirubin Urine: NEGATIVE
Glucose, UA: NEGATIVE mg/dL
Hgb urine dipstick: NEGATIVE
Ketones, ur: NEGATIVE mg/dL
Nitrite: NEGATIVE
Protein, ur: NEGATIVE mg/dL
Specific Gravity, Urine: 1.017 (ref 1.005–1.030)
pH: 7 (ref 5.0–8.0)

## 2019-05-13 NOTE — Telephone Encounter (Signed)
The CT was ordered by [facility], please touch base with her I would suggest she at least get urine test done locally to exclude UTI Also, sometimes constipation can cause difficulties holding urine

## 2019-05-13 NOTE — Telephone Encounter (Signed)
Called Sylwia back and advised her of message from [name].  Discussed that I will check with Melissa and will call her back tomorrow. Dashanti said she will call her PCP and will try to get in for a UA.

## 2019-05-13 NOTE — Telephone Encounter (Signed)
Oral Oncology Patient Advocate Encounter  I called the patient to give her information about her current grant status.  I took all of her grant balances and expiration dates along with her copays and estimated copay at the beginning of the year to decide when we would need a new grant. The patient should be ok with her grants until 01/24/20 as long as the grants are used correctly and I will do my best to keep up with this. As the grants expire I will be looking for the foundation to open so that I can renew her grants.  The patient verbalized understanding and great appreciation.  [facility] Phone [phone] Fax [phone] 05/13/2019   1:37 PM

## 2019-05-13 NOTE — Telephone Encounter (Signed)
[name] called and said that before she was diagnosed with cancer, she was having frequent urinary incontinence.  It stopped after she had surgery.  For the past two weeks, it has started again where she can't hold her urine.  She denies having dysuria and said otherwise she is feeling good.  She is wondering if the CT scan in September should be moved up.

## 2019-05-14 LAB — URINE CULTURE

## 2019-05-15 ENCOUNTER — Telehealth: Payer: Self-pay | Admitting: Oncology

## 2019-05-15 MED FILL — RUBRACA 300 MG TAB: 300 | 30 days supply | Qty: 60 | Fill #3

## 2019-05-15 NOTE — Telephone Encounter (Signed)
Called [name] and advised her of urine culture results which were abnormal due to contamination.  Discussed that her symptoms could still be due to a UTI and that [name] is recommending that she have it repeated locally.  She verbalized understanding and agreement.

## 2019-05-22 ENCOUNTER — Other Ambulatory Visit: Payer: Self-pay

## 2019-05-22 ENCOUNTER — Inpatient Hospital Stay: Payer: Medicare HMO

## 2019-05-22 ENCOUNTER — Inpatient Hospital Stay (HOSPITAL_BASED_OUTPATIENT_CLINIC_OR_DEPARTMENT_OTHER): Payer: Medicare HMO | Admitting: Hematology and Oncology

## 2019-05-22 ENCOUNTER — Inpatient Hospital Stay: Payer: Medicare HMO | Attending: Gynecologic Oncology

## 2019-05-22 DIAGNOSIS — Z7984 Long term (current) use of oral hypoglycemic drugs: Secondary | ICD-10-CM

## 2019-05-22 DIAGNOSIS — R32 Unspecified urinary incontinence: Secondary | ICD-10-CM

## 2019-05-22 DIAGNOSIS — G620 Drug-induced polyneuropathy: Secondary | ICD-10-CM | POA: Diagnosis not present

## 2019-05-22 DIAGNOSIS — Z452 Encounter for adjustment and management of vascular access device: Secondary | ICD-10-CM | POA: Diagnosis not present

## 2019-05-22 DIAGNOSIS — D61818 Other pancytopenia: Secondary | ICD-10-CM

## 2019-05-22 DIAGNOSIS — Z79899 Other long term (current) drug therapy: Secondary | ICD-10-CM | POA: Diagnosis not present

## 2019-05-22 DIAGNOSIS — C562 Malignant neoplasm of left ovary: Secondary | ICD-10-CM

## 2019-05-22 DIAGNOSIS — T451X5A Adverse effect of antineoplastic and immunosuppressive drugs, initial encounter: Secondary | ICD-10-CM | POA: Insufficient documentation

## 2019-05-22 DIAGNOSIS — Z9221 Personal history of antineoplastic chemotherapy: Secondary | ICD-10-CM | POA: Insufficient documentation

## 2019-05-22 DIAGNOSIS — K5900 Constipation, unspecified: Secondary | ICD-10-CM | POA: Insufficient documentation

## 2019-05-22 LAB — COMPREHENSIVE METABOLIC PANEL WITH GFR
ALT: 18 U/L (ref 0–44)
AST: 20 U/L (ref 15–41)
Albumin: 3.9 g/dL (ref 3.5–5.0)
Alkaline Phosphatase: 60 U/L (ref 38–126)
Anion gap: 9 (ref 5–15)
BUN: 14 mg/dL (ref 8–23)
CO2: 25 mmol/L (ref 22–32)
Calcium: 9.7 mg/dL (ref 8.9–10.3)
Chloride: 105 mmol/L (ref 98–111)
Creatinine, Ser: 0.78 mg/dL (ref 0.44–1.00)
GFR calc Af Amer: >60 mL/min
GFR calc non Af Amer: >60 mL/min
Glucose, Bld: 101 mg/dL — ABNORMAL HIGH (ref 70–99)
Potassium: 4.3 mmol/L (ref 3.5–5.1)
Sodium: 139 mmol/L (ref 135–145)
Total Bilirubin: 0.5 mg/dL (ref 0.3–1.2)
Total Protein: 6.5 g/dL (ref 6.5–8.1)

## 2019-05-22 LAB — CBC WITH DIFFERENTIAL/PLATELET
Abs Immature Granulocytes: 0.02 K/uL (ref 0.00–0.07)
Basophils Absolute: 0.1 K/uL (ref 0.0–0.1)
Basophils Relative: 1 %
Eosinophils Absolute: 0.1 K/uL (ref 0.0–0.5)
Eosinophils Relative: 2 %
HCT: 34.6 % — ABNORMAL LOW (ref 36.0–46.0)
Hemoglobin: 11.5 g/dL — ABNORMAL LOW (ref 12.0–15.0)
Immature Granulocytes: 0 %
Lymphocytes Relative: 29 %
Lymphs Abs: 1.6 K/uL (ref 0.7–4.0)
MCH: 33.1 pg (ref 26.0–34.0)
MCHC: 33.2 g/dL (ref 30.0–36.0)
MCV: 99.7 fL (ref 80.0–100.0)
Monocytes Absolute: 0.4 K/uL (ref 0.1–1.0)
Monocytes Relative: 7 %
Neutro Abs: 3.5 K/uL (ref 1.7–7.7)
Neutrophils Relative %: 61 %
Platelets: 175 K/uL (ref 150–400)
RBC: 3.47 MIL/uL — ABNORMAL LOW (ref 3.87–5.11)
RDW: 14.5 % (ref 11.5–15.5)
WBC: 5.7 K/uL (ref 4.0–10.5)
nRBC: 0 % (ref 0.0–0.2)

## 2019-05-22 MED ORDER — HEPARIN SOD (PORK) LOCK FLUSH 100 UNIT/ML IV SOLN
250.0000 [IU] | Freq: Once | INTRAVENOUS | Status: AC
  Administered 2019-05-22: 500 [IU]
  Filled 2019-05-22: qty 5

## 2019-05-22 MED ORDER — SODIUM CHLORIDE 0.9% FLUSH
10.0000 mL | Freq: Once | INTRAVENOUS | Status: AC
  Administered 2019-05-22: 10 mL
  Filled 2019-05-22: qty 10

## 2019-05-22 MED ORDER — LIDOCAINE-PRILOCAINE 2.5-2.5 % EX CREA: TOPICAL_CREAM | CUTANEOUS | 3 refills | Status: DC

## 2019-05-22 NOTE — Patient Instructions (Signed)

## 2019-05-23 ENCOUNTER — Encounter: Payer: Self-pay | Admitting: Hematology and Oncology

## 2019-05-23 LAB — CA 125: Cancer Antigen (CA) 125: 14.4 U/mL (ref 0.0–38.1)

## 2019-05-23 NOTE — Assessment & Plan Note (Signed)
She continues to have mild residual peripheral neuropathy from treatment it flares up from time to time The patient declined prescription gabapentin because of intolerance side effects She has taken some periodic pain medicine as needed for flare of neuropathy.

## 2019-05-23 NOTE — Assessment & Plan Note (Signed)
She has chronic stable intermittent anemia Could be related to side effects of treatment Observe only for now

## 2019-05-23 NOTE — Progress Notes (Signed)
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  Pancytopenia, acquired [facility]) She has chronic stable intermittent anemia Could be related to side effects of treatment Observe only for now   Orders Placed This Encounter  Procedures  . CA 125    Standing Status:   Standing    Number of Occurrences:   9    Standing Expiration Date:   05/22/2020    INTERVAL HISTORY: Please see below for problem oriented charting. 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 Ovary and fallopian tube, left - SEROUS CYSTADENOCARCINOMA, HIGH GRADE, SPANNING APPROXIMATELY 11  CM. - TUMOR INVOLVES OVARY SURFACE AND LEFT FALLOPIAN TUBE. - SEE ONCOLOGY TABLE. 2. Mesentery, small bowel mesentery biopsy #1 - BENIGN FIBROADIPOSE TISSUE. 3. Ovary and fallopian tube, right - BENIGN OVARY WITH INCLUSION CYSTS. - BENIGN FALLOPIAN TUBE WITH PARATUBAL CYSTS AND ADENOFIBROMA. 4. Omentum, resection for tumor - BENIGN ADIPOSE TISSUE. 5. Peritoneum, biopsy, left diaphragmatic - BENIGN PERITONEAL TYPE TISSUE. 6. Peritoneum, biopsy, right diaphragmatic - BENIGN PERITONEAL TYPE TISSUE. 7. Mesentery, small bowel mesenteric biopsy #2 - BENIGN FIBROADIPOSE TISSUE. 8. Lymph nodes, regional resection, right pelvic - FIVE OF FIVE LYMPH NODES NEGATIVE FOR CARCINOMA (0/5). 9. Lymph nodes, regional resection, left pelvic - FOUR OF FOUR LYMPH NODES NEGATIVE FOR CARCINOMA (0/4). 10. Peritoneum, biopsy, left gutter - BENIGN PERITONEAL TYPE TISSUE. 11. Peritoneum, biopsy, bladder - BENIGN PERITONEAL TYPE TISSUE WITH ACUTE INFLAMMATION AND CALCIFICATIONS.  12. Peritoneum, biopsy, cul-de-sac - BENIGN PERITONEAL TYPE TISSUE WITH ACUTE INFLAMMATION AND CALCIFICATIONS. 13. Soft tissue, biopsy, right gutter - BENIGN FIBROADIPOSE TISSUE. 14. Lymph node, biopsy, right para-aortic - TWO OF TWO LYMPH NODES NEGATIVE FOR CARCINOMA (0/2). 15. Lymph node, biopsy, left para-aortic - ONE OF ONE LYMPH NODES NEGATIVE FOR CARCINOMA (0/1). Microscopic Comment 1. OVARY Specimen(s): Left ovary and fallopian tube. Procedure: (including lymph node sampling): Bilateral salpingo-oophorectomy with omental and peritoneal biopsies and lymph node biopsies. Primary tumor site (including laterality): Left ovary. Ovarian surface involvement: Present. Ovarian capsule intact without fragmentation: Intact. Maximum tumor size (cm): 11 cm total. Histologic type: Serous cystadenocarcinoma. Grade: High grade (low grade areas also present). Peritoneal implants: (specify invasive or non-invasive): N/A. Pelvic  extension (list additional structures on separate  lines and if involved): Left fallopian tube. Lymph nodes: number examined 12 ; number positive 0 TNM code: pT2a, pN0, pMX FIGO Stage (based on pathologic findings, needs clinical correlation): IIA Comments: None.   01/31/2018 Pathology Results   PERITONEAL WASHING (SPECIMEN 1 OF 1 COLLECTED 01/31/18): MALIGNANT CELLS PRESENT CONSISTENT WITH METASTATIC CARCINOMA.   01/31/2018 Surgery   Procedure(s) Performed:  1. Robotic BSO and washings. 2. Exploratory laparotomy, Staging including infragastric omentectomy, Bilateral pelvic and paraaortic lymphadenectomy, pertioneal biopsies.  Specimens: Bilateral tubes / ovaries, bilateral pelvic and paraaortic lymph nodes, peritoneal biopsies, washings and omentum.  Operative Findings: The left adnexa was adherent to the left pelvic peritoneum suspected due to the inferior retraction from the vaginal hysterectomy. Intraoperative leakage of cystic fluid from left ovary.  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  The right adnexa grossly appeared normal although slightly enlarged for her age.  There were some indurated lymph nodes but no overtly malignant lymph nodes were suspected.  Small bowel mesentery had 3-4 superficial possible implants.  1 of these was sent for frozen section returned benign.  She did have adhesive disease from her open cholecystectomy in the right upper quadrant.  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 16 mm exophytic lesion upper pole right  kidney has attenuation too high to be a simple cyst. This may be a cyst complicated by proteinaceous debris or hemorrhage, but renal cell carcinoma is a concern. Routine outpatient follow-up MRI of the abdomen without and with contrast recommended after resolution of patient's acute symptoms (so she is better able to participate with positioning and breath holding). 2. Mild edema/possible minimal hemorrhage in the extraperitoneal soft tissues of the pelvic for tracking up both pelvic sidewalls. Imaging appearance is not outside of the spectrum of findings expected 6 days after the reported surgery. Trace interloop mesenteric and free fluid is identified in the peritoneal cavity in there is some mild edema in the omentum. There is no organized or rim enhancing collection to suggest evolving abscess. 3. Gas in the extraperitoneal soft tissues of the left lower quadrant is associated with gas in the subcutaneous fat of the lower left anterior abdominal wall. This is not unexpected on postoperative day 6. No evidence for intraperitoneal free air. 4. No CT features to suggest small bowel obstruction. Oral contrast material has migrated about halfway through the small bowel loops but there is no differential distention of proximal versus distal small bowel. The colon is diffusely distended and fluid-filled proximally, but formed stool is noted in the distal colon. Given apparent slow migration of contrast and fluid-filled small and large bowel loops, a component of ileus is possible.   02/14/2018 Imaging   MR abdomen Benign Bosniak category 2 cyst in upper pole of right kidney, corresponding with lesion seen on previous CT. No evidence of renal neoplasm or other significant abnormality.    02/18/2018 Cancer Staging   Staging form: Ovary, Fallopian Tube, and Primary Peritoneal Carcinoma, AJCC 8th Edition - Pathologic: Stage II (pT2, pN0, cM0) - Signed by Heath Lark, MD on  02/18/2018   02/28/2018 Procedure    Status post right IJ port catheter placement. 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 For last cycle, taxol was omitted   07/18/2018 Imaging   1. No evidence for residual or recurrent tumor within the abdomen or pelvis. There has been interval resolution edema and fluid within the peritoneal cavity. 2. New pulmonary nodule is identified within the right lower lobe measuring 1.1 cm, image 87/4. Consider one of the following in 3 months for both low-risk and high-risk individuals: (a) repeat chest CT, (b) follow-up PET-CT, or (c) tissue sampling. This recommendation follows the consensus statement: Guidelines for Management of Incidental Pulmonary Nodules Detected on CT Images: From the Fleischner Society 2017; Radiology 2017; 284:228-243. 3. Stable 7 mm nodule within the right upper lobe. 4. Aortic Atherosclerosis (ICD10-I70.0). 5. Multi vessel coronary artery atherosclerotic calcifications.   07/18/2018 Tumor Marker   Patient's tumor was tested for the following markers: CA-125 Results of the tumor marker test revealed 14.9   07/25/2018 PET scan   1. Regressing right lower lobe peripheral lung lesion, likely resolving inflammatory or atelectatic process. No hypermetabolism to suggest malignancy. I would recommend a follow-up noncontrast chest CT and 4-6 months. Small upper lobe pulmonary nodules are stable. 2. 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   07/31/2018 -  Chemotherapy   The patient started taking rucaparib   10/25/2018 Tumor Marker   Patient's tumor was tested for the following markers: CA-125 Results of the tumor marker test revealed 12.1    11/25/2018 Imaging   1. Right lower lobe nodule has resolved. Additional right lung nodules are stable. 2. Stable hazy nodularity along the left external iliac chain.  3. Hepatomegaly. 4. Aortic atherosclerosis (ICD10-170.0). Coronary artery calcification. 5. Enlarged pulmonic trunk, indicative of pulmonary arterial hypertension.   01/16/2019 Tumor Marker   Patient's tumor was tested for the following markers: CA-125 Results of the tumor marker test revealed 13.1   05/22/2019 Tumor Marker   Patient's tumor was tested for the following markers: CA-125 Results of the tumor marker test revealed 14.4     REVIEW OF SYSTEMS:   Constitutional: Denies fevers, chills or abnormal weight loss Eyes: Denies blurriness of vision Ears, nose, mouth, throat, and face: Denies mucositis or sore throat Respiratory: Denies cough, dyspnea or wheezes Cardiovascular: Denies palpitation, chest discomfort or lower extremity swelling Gastrointestinal:  Denies nausea, heartburn or change in bowel habits Skin: Denies abnormal skin rashes Lymphatics: Denies new lymphadenopathy or easy bruising Neurological:Denies numbness, tingling or new weaknesses Behavioral/Psych: Mood is stable, no new changes  All other systems were reviewed with the patient and are negative.  I have reviewed the past medical history, past surgical history, social history and family history with the patient and they are unchanged from previous note.  ALLERGIES:  is allergic to penicillins.  MEDICATIONS:  Current Outpatient Medications  Medication Sig Dispense Refill  . polycarbophil (FIBERCON) 625 MG tablet Take 625 mg by mouth daily.    . polyethylene glycol (MIRALAX / GLYCOLAX) 17 g packet Take 17 g by mouth daily.    . Cyanocobalamin (B-12 COMPLIANCE INJECTION) 1000 MCG/ML KIT Inject 1,000 mcg as directed every 30 (  thirty) days.    Marland Kitchen FLUoxetine (PROZAC) 20 MG capsule Take 30 mg by mouth daily after breakfast.     .  hydrochlorothiazide (HYDRODIURIL) 25 MG tablet Take 25 mg by mouth daily.    Marland Kitchen HYDROcodone-acetaminophen (NORCO) 10-325 MG tablet Take 1 tablet by mouth every 6 (six) hours as needed. 60 tablet 0  . levothyroxine (SYNTHROID) 112 MCG tablet Take 1 tablet (112 mcg total) by mouth daily before breakfast.    . lidocaine-prilocaine (EMLA) cream Apply to affected area once 30 g 3  . losartan (COZAAR) 100 MG tablet Take 100 mg by mouth at bedtime.     . Melatonin 5 MG TABS Take 5-10 mg by mouth at bedtime as needed (sleep).     . metFORMIN (GLUCOPHAGE-XR) 500 MG 24 hr tablet Take 500 mg by mouth 2 (two) times daily.    . rucaparib camsylate (RUBRACA) 300 MG tablet Take 1 tablet (300 mg total) by mouth 2 (two) times daily. 60 tablet 11   No current facility-administered medications for this visit.     PHYSICAL EXAMINATION: ECOG PERFORMANCE STATUS: 1 - Symptomatic but completely ambulatory  Vitals:   05/22/19 1008  BP: (!) 128/57  Pulse: 64  Resp: 18  Temp: 98.7 F (37.1 C)  SpO2: 100%   Filed Weights   05/22/19 1008  Weight: 165 lb 3.2 oz (74.9 kg)    GENERAL:alert, no distress and comfortable SKIN: skin color, texture, turgor are normal, no rashes or significant lesions EYES: normal, Conjunctiva are pink and non-injected, sclera clear OROPHARYNX:no exudate, no erythema and lips, buccal mucosa, and tongue normal  NECK: supple, thyroid normal size, non-tender, without nodularity LYMPH:  no palpable lymphadenopathy in the cervical, axillary or inguinal LUNGS: clear to auscultation and percussion with normal breathing effort HEART: regular rate & rhythm and no murmurs and no lower extremity edema ABDOMEN:abdomen soft, non-tender and normal bowel sounds Musculoskeletal:no cyanosis of digits and no clubbing  NEURO: alert & oriented x 3 with fluent speech, no focal motor/sensory deficits  LABORATORY DATA:  I have reviewed the data as listed    Component Value Date/Time   NA 139  05/22/2019 0842   K 4.3 05/22/2019 0842   CL 105 05/22/2019 0842   CO2 25 05/22/2019 0842   GLUCOSE 101 (H) 05/22/2019 0842   BUN 14 05/22/2019 0842   CREATININE 0.78 05/22/2019 0842   CREATININE 0.93 02/13/2019 1024   CALCIUM 9.7 05/22/2019 0842   PROT 6.5 05/22/2019 0842   ALBUMIN 3.9 05/22/2019 0842   AST 20 05/22/2019 0842   AST 26 02/13/2019 1024   ALT 18 05/22/2019 0842   ALT 23 02/13/2019 1024   ALKPHOS 60 05/22/2019 0842   BILITOT 0.5 05/22/2019 0842   BILITOT 0.8 02/13/2019 1024   GFRNONAA >60 05/22/2019 0842   GFRNONAA 59 (L) 02/13/2019 1024   GFRAA >60 05/22/2019 0842   GFRAA >60 02/13/2019 1024    No results found for: SPEP, UPEP  Lab Results  Component Value Date   WBC 5.7 05/22/2019   NEUTROABS 3.5 05/22/2019   HGB 11.5 (L) 05/22/2019   HCT 34.6 (L) 05/22/2019   MCV 99.7 05/22/2019   PLT 175 05/22/2019      Chemistry      Component Value Date/Time   NA 139 05/22/2019 0842   K 4.3 05/22/2019 0842   CL 105 05/22/2019 0842   CO2 25 05/22/2019 0842   BUN 14 05/22/2019 0842   CREATININE 0.78 05/22/2019 0842   CREATININE  0.93 02/13/2019 1024      Component Value Date/Time   CALCIUM 9.7 05/22/2019 0842   ALKPHOS 60 05/22/2019 0842   AST 20 05/22/2019 0842   AST 26 02/13/2019 1024   ALT 18 05/22/2019 0842   ALT 23 02/13/2019 1024   BILITOT 0.5 05/22/2019 0842   BILITOT 0.8 02/13/2019 1024      All questions were answered. The patient knows to call the clinic with any problems, questions or concerns. No barriers to learning was detected.  I spent 15 minutes counseling the patient face to face. The total time spent in the appointment was 20 minutes and more than 50% was on counseling and review of test results  Heath Lark, MD 05/23/2019 3:13 PM

## 2019-05-23 NOTE — Assessment & Plan Note (Signed)
Overall, she tolerated reduced dose chemotherapy well She will continue the same We will continue serial tumor marker monitoring; her recent tumor marker is stable I recommend we continue the same at reduced dose She is scheduled to see GYN oncologist in the next month I will see her back the following visit.

## 2019-05-26 ENCOUNTER — Telehealth: Payer: Self-pay | Admitting: Hematology and Oncology

## 2019-05-26 ENCOUNTER — Telehealth: Payer: Self-pay | Admitting: *Deleted

## 2019-05-26 NOTE — Telephone Encounter (Signed)
-----   Message from Heath Lark, MD sent at 05/23/2019  9:42 AM EDT ----- Regarding: CA-125 is stable, pls call and let ehr know

## 2019-05-26 NOTE — Telephone Encounter (Signed)
Scheduled appt per 8/07 sch message- pt aware of appt date and time   

## 2019-05-26 NOTE — Telephone Encounter (Signed)
Patient notified of lab results. She understands to call with any questions or concerns. She will view full report on MyChart when it becomes available.

## 2019-06-16 MED FILL — RUBRACA 300 MG TAB: 300 | 30 days supply | Qty: 60 | Fill #4

## 2019-06-20 ENCOUNTER — Other Ambulatory Visit: Payer: Self-pay | Admitting: Hematology and Oncology

## 2019-06-20 ENCOUNTER — Telehealth: Payer: Self-pay

## 2019-06-20 DIAGNOSIS — E039 Hypothyroidism, unspecified: Secondary | ICD-10-CM

## 2019-06-20 NOTE — Telephone Encounter (Signed)
ordered

## 2019-06-20 NOTE — Telephone Encounter (Signed)
Called and left message that TSH is added. Instructed to call the office if needed.

## 2019-06-20 NOTE — Telephone Encounter (Signed)
She called and left a message. Can you add a order for TSH on 9/8 since she is already having labs? She is feeling tired and it is not time to see other MD.

## 2019-06-24 ENCOUNTER — Encounter (HOSPITAL_COMMUNITY): Payer: Self-pay

## 2019-06-24 ENCOUNTER — Telehealth: Payer: Self-pay | Admitting: Oncology

## 2019-06-24 ENCOUNTER — Other Ambulatory Visit: Payer: Self-pay

## 2019-06-24 ENCOUNTER — Ambulatory Visit (HOSPITAL_COMMUNITY)
Admission: RE | Admit: 2019-06-24 | Discharge: 2019-06-24 | Disposition: A | Discharge status: Home / Self Care | Payer: Medicare HMO | Source: Ambulatory Visit | Attending: Gynecologic Oncology | Admitting: Gynecologic Oncology

## 2019-06-24 ENCOUNTER — Inpatient Hospital Stay: Payer: Medicare HMO

## 2019-06-24 ENCOUNTER — Inpatient Hospital Stay: Payer: Medicare HMO | Attending: Gynecologic Oncology

## 2019-06-24 DIAGNOSIS — Z7984 Long term (current) use of oral hypoglycemic drugs: Secondary | ICD-10-CM | POA: Diagnosis not present

## 2019-06-24 DIAGNOSIS — Z9221 Personal history of antineoplastic chemotherapy: Secondary | ICD-10-CM | POA: Diagnosis not present

## 2019-06-24 DIAGNOSIS — E039 Hypothyroidism, unspecified: Secondary | ICD-10-CM | POA: Insufficient documentation

## 2019-06-24 DIAGNOSIS — C562 Malignant neoplasm of left ovary: Secondary | ICD-10-CM | POA: Diagnosis not present

## 2019-06-24 DIAGNOSIS — R918 Other nonspecific abnormal finding of lung field: Secondary | ICD-10-CM | POA: Insufficient documentation

## 2019-06-24 DIAGNOSIS — Z79899 Other long term (current) drug therapy: Secondary | ICD-10-CM | POA: Diagnosis not present

## 2019-06-24 LAB — COMPREHENSIVE METABOLIC PANEL
ALT: 46 U/L — ABNORMAL HIGH (ref 0–44)
AST: 34 U/L (ref 15–41)
Albumin: 4 g/dL (ref 3.5–5.0)
Alkaline Phosphatase: 74 U/L (ref 38–126)
Anion gap: 11 (ref 5–15)
BUN: 21 mg/dL (ref 8–23)
CO2: 26 mmol/L (ref 22–32)
Calcium: 9.9 mg/dL (ref 8.9–10.3)
Chloride: 102 mmol/L (ref 98–111)
Creatinine, Ser: 0.96 mg/dL (ref 0.44–1.00)
GFR calc Af Amer: >60 mL/min (ref >60)
GFR calc non Af Amer: 57 mL/min — ABNORMAL LOW (ref >60)
Glucose, Bld: 128 mg/dL — ABNORMAL HIGH (ref 70–99)
Potassium: 4.5 mmol/L (ref 3.5–5.1)
Sodium: 139 mmol/L (ref 135–145)
Total Bilirubin: 0.8 mg/dL (ref 0.3–1.2)
Total Protein: 6.9 g/dL (ref 6.5–8.1)

## 2019-06-24 LAB — CBC WITH DIFFERENTIAL/PLATELET
Abs Immature Granulocytes: 0.01 10*3/uL (ref 0.00–0.07)
Basophils Absolute: 0.1 10*3/uL (ref 0.0–0.1)
Basophils Relative: 1 %
Eosinophils Absolute: 0.1 10*3/uL (ref 0.0–0.5)
Eosinophils Relative: 2 %
HCT: 34.3 % — ABNORMAL LOW (ref 36.0–46.0)
Hemoglobin: 11.4 g/dL — ABNORMAL LOW (ref 12.0–15.0)
Immature Granulocytes: 0 %
Lymphocytes Relative: 27 %
Lymphs Abs: 1.5 10*3/uL (ref 0.7–4.0)
MCH: 32.9 pg (ref 26.0–34.0)
MCHC: 33.2 g/dL (ref 30.0–36.0)
MCV: 99.1 fL (ref 80.0–100.0)
Monocytes Absolute: 0.4 10*3/uL (ref 0.1–1.0)
Monocytes Relative: 6 %
Neutro Abs: 3.5 10*3/uL (ref 1.7–7.7)
Neutrophils Relative %: 64 %
Platelets: 180 10*3/uL (ref 150–400)
RBC: 3.46 MIL/uL — ABNORMAL LOW (ref 3.87–5.11)
RDW: 14.5 % (ref 11.5–15.5)
WBC: 5.6 10*3/uL (ref 4.0–10.5)
nRBC: 0 % (ref 0.0–0.2)

## 2019-06-24 LAB — TSH: TSH: 1.177 u[IU]/mL (ref 0.308–3.960)

## 2019-06-24 MED ORDER — HEPARIN SOD (PORK) LOCK FLUSH 100 UNIT/ML IV SOLN
INTRAVENOUS | Status: AC
  Filled 2019-06-24: qty 5

## 2019-06-24 MED ORDER — HEPARIN SOD (PORK) LOCK FLUSH 100 UNIT/ML IV SOLN
500.0000 [IU] | Freq: Once | INTRAVENOUS | Status: AC
  Administered 2019-06-24: 500 [IU] via INTRAVENOUS

## 2019-06-24 MED ORDER — SODIUM CHLORIDE (PF) 0.9 % IJ SOLN
INTRAMUSCULAR | Status: AC
  Filled 2019-06-24: qty 50

## 2019-06-24 MED ORDER — SODIUM CHLORIDE 0.9% FLUSH
10.0000 mL | Freq: Once | INTRAVENOUS | Status: AC
  Administered 2019-06-24: 10 mL
  Filled 2019-06-24: qty 10

## 2019-06-24 MED ORDER — IOHEXOL 300 MG/ML  SOLN
100.0000 mL | Freq: Once | INTRAMUSCULAR | Status: AC | PRN
  Administered 2019-06-24: 100 mL via INTRAVENOUS

## 2019-06-24 NOTE — Patient Instructions (Signed)

## 2019-06-24 NOTE — Telephone Encounter (Addendum)
[name] and scheduled an appointment to see [name] tomorrow at 9:30 and canceled her appointment with Dr. Skeet Latch on 06/26/19.  She verbalized understanding and agreement.

## 2019-06-25 ENCOUNTER — Encounter: Payer: Self-pay | Admitting: Hematology and Oncology

## 2019-06-25 ENCOUNTER — Inpatient Hospital Stay (HOSPITAL_BASED_OUTPATIENT_CLINIC_OR_DEPARTMENT_OTHER): Payer: Medicare HMO | Admitting: Hematology and Oncology

## 2019-06-25 ENCOUNTER — Other Ambulatory Visit: Payer: Self-pay

## 2019-06-25 DIAGNOSIS — E039 Hypothyroidism, unspecified: Secondary | ICD-10-CM | POA: Diagnosis not present

## 2019-06-25 DIAGNOSIS — R918 Other nonspecific abnormal finding of lung field: Secondary | ICD-10-CM

## 2019-06-25 DIAGNOSIS — C562 Malignant neoplasm of left ovary: Secondary | ICD-10-CM | POA: Diagnosis not present

## 2019-06-25 LAB — CA 125: Cancer Antigen (CA) 125: 14.3 U/mL (ref 0.0–38.1)

## 2019-06-25 NOTE — Assessment & Plan Note (Signed)
Her TSH is within normal limits She will continue her thyroid medicine as prescribed

## 2019-06-25 NOTE — Assessment & Plan Note (Signed)
CT imaging and tumor marker are within normal limits I do not plan to repeat imaging study in the future unless she have signs or symptoms to suggest disease progression She will continue her current treatment indefinitely I will see her again in about 8 weeks time for further follow-up

## 2019-06-25 NOTE — Assessment & Plan Note (Signed)
She has persistent lung nodules seen, worrisome for atypical infection She did not complete her pulmonary evaluation 6 months ago due to concern for COVID-19 She had some recent cough but that was self-limiting I will refer her back to her pulmonologist for further evaluation and management and she agreed

## 2019-06-25 NOTE — Progress Notes (Signed)
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[facility]) CT imaging and tumor marker are within normal limits I do not plan to repeat imaging study in the future unless she have signs or symptoms to suggest disease progression She will continue her current treatment indefinitely I will see her again in about 8 weeks time for further follow-up   Acquired hypothyroidism Her TSH is within normal limits She will continue her thyroid medicine as prescribed  Multiple lung nodules on CT She has persistent lung nodules seen, worrisome for atypical infection She did not complete her pulmonary evaluation 6 months ago due to concern for COVID-19 She had some recent cough but that was self-limiting I will refer her back to her pulmonologist for further evaluation and management and she agreed   No orders of the defined types were placed in this encounter.   INTERVAL HISTORY: Please see below for problem oriented charting. 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 - TUMOR INVOLVES OVARY SURFACE AND LEFT FALLOPIAN TUBE. - SEE ONCOLOGY TABLE. 2. Mesentery, small bowel mesentery biopsy #1 - BENIGN FIBROADIPOSE TISSUE. 3. Ovary and fallopian tube, right - BENIGN OVARY WITH INCLUSION CYSTS. - BENIGN FALLOPIAN  TUBE WITH PARATUBAL CYSTS AND ADENOFIBROMA. 4. Omentum, resection for tumor - BENIGN ADIPOSE TISSUE. 5. Peritoneum, biopsy, left diaphragmatic - BENIGN PERITONEAL TYPE TISSUE. 6. Peritoneum, biopsy, right diaphragmatic - BENIGN PERITONEAL TYPE TISSUE. 7. Mesentery, small bowel mesenteric biopsy #2 - BENIGN FIBROADIPOSE TISSUE. 8. Lymph nodes, regional resection, right pelvic - FIVE OF FIVE LYMPH NODES NEGATIVE FOR CARCINOMA (0/5). 9. Lymph nodes, regional resection, left pelvic - FOUR OF FOUR LYMPH NODES NEGATIVE FOR CARCINOMA (0/4). 10. Peritoneum, biopsy, left gutter - BENIGN PERITONEAL TYPE TISSUE. 11. Peritoneum, biopsy, bladder - BENIGN PERITONEAL TYPE TISSUE WITH ACUTE INFLAMMATION AND CALCIFICATIONS.  12. Peritoneum, biopsy, cul-de-sac - BENIGN PERITONEAL TYPE TISSUE WITH ACUTE INFLAMMATION AND CALCIFICATIONS. 13. Soft tissue, biopsy, right gutter - BENIGN FIBROADIPOSE TISSUE. 14. Lymph node, biopsy, right para-aortic - TWO OF TWO LYMPH NODES NEGATIVE FOR CARCINOMA (0/2). 15. Lymph node, biopsy, left para-aortic - ONE OF ONE LYMPH NODES NEGATIVE FOR CARCINOMA (0/1). Microscopic Comment 1. OVARY Specimen(s): Left ovary and fallopian tube. Procedure: (including lymph node sampling): Bilateral salpingo-oophorectomy with omental and peritoneal biopsies and lymph node biopsies. Primary tumor site (including laterality): Left ovary. Ovarian surface involvement: Present. Ovarian capsule intact without fragmentation: Intact. Maximum tumor size (cm): 11 cm total. Histologic type: Serous cystadenocarcinoma. Grade: High grade (low grade areas also present). Peritoneal implants: (specify invasive or non-invasive): N/A. Pelvic extension (list additional structures on separate lines and if involved): Left fallopian tube. Lymph nodes: number examined 12 ; number positive 0 TNM code: pT2a, pN0, pMX FIGO Stage (based on pathologic findings, needs clinical correlation): IIA Comments:  None.   01/31/2018 Pathology Results   PERITONEAL  WASHING (SPECIMEN 1 OF 1 COLLECTED 01/31/18): MALIGNANT CELLS PRESENT CONSISTENT WITH METASTATIC CARCINOMA.   01/31/2018 Surgery   Procedure(s) Performed:  1. Robotic BSO and washings. 2. Exploratory laparotomy, Staging including infragastric omentectomy, Bilateral pelvic and paraaortic lymphadenectomy, pertioneal biopsies.  Specimens: Bilateral tubes / ovaries, bilateral pelvic and paraaortic lymph nodes, peritoneal biopsies, washings and omentum.  Operative Findings: The left adnexa was adherent to the left pelvic peritoneum suspected due to the inferior retraction from the vaginal hysterectomy. Intraoperative leakage of cystic fluid from left ovary.  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  The right adnexa grossly appeared normal although slightly enlarged for her age.  There were some indurated lymph nodes but no overtly malignant lymph nodes were suspected.  Small bowel mesentery had 3-4 superficial possible implants.  1 of these was sent for frozen section returned benign.  She did have adhesive disease from her open cholecystectomy in the right upper quadrant.  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 16 mm exophytic lesion upper pole right kidney has attenuation too high to be a simple cyst. This may be a cyst complicated by proteinaceous debris or hemorrhage, but renal cell carcinoma is a concern. Routine outpatient follow-up MRI of the abdomen without and with contrast recommended after  resolution of patient's acute symptoms (so she is better able to participate with positioning and breath holding). 2. Mild edema/possible minimal hemorrhage in the extraperitoneal soft tissues of the pelvic for tracking up both pelvic sidewalls. Imaging appearance is not outside of the spectrum of findings expected 6 days after the reported surgery. Trace interloop mesenteric and free fluid is identified in the peritoneal cavity in there is some mild edema in the omentum. There is no organized or rim enhancing collection to suggest evolving abscess. 3. Gas in the extraperitoneal soft tissues of the left lower quadrant is associated with gas in the subcutaneous fat of the lower left anterior abdominal wall. This is not unexpected on postoperative day 6. No evidence for intraperitoneal free air. 4. No CT features to suggest small bowel obstruction. Oral contrast material has migrated about halfway through the small bowel loops but there is no differential distention of proximal versus distal small bowel. The colon is diffusely distended and fluid-filled proximally, but formed stool is noted in the distal colon. Given apparent slow migration of contrast and fluid-filled small and large bowel loops, a component of ileus is possible.   02/14/2018 Imaging   MR abdomen Benign Bosniak category 2 cyst in upper pole of right kidney, corresponding with lesion seen on previous CT. No evidence of renal neoplasm or other significant abnormality.    02/18/2018 Cancer Staging   Staging form: Ovary, Fallopian Tube, and Primary Peritoneal Carcinoma, AJCC 8th Edition - Pathologic: Stage II (pT2, pN0, cM0) - Signed by Heath Lark, MD on 02/18/2018   02/28/2018 Procedure   Status post right IJ port catheter placement. 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 For last cycle, taxol was omitted   07/18/2018 Imaging   1. No evidence for residual or recurrent tumor within the abdomen or pelvis. There has been interval resolution edema and fluid within the peritoneal cavity. 2. New pulmonary nodule is identified within the right lower lobe measuring 1.1 cm, image 87/4. Consider one of the following in 3 months for both low-risk and high-risk individuals: (a) repeat chest CT, (b) follow-up PET-CT, or (c) tissue sampling. This recommendation follows the consensus statement: Guidelines for Management of Incidental Pulmonary Nodules Detected on CT Images: From the Fleischner Society 2017; Radiology 2017; 284:228-243. 3. Stable 7 mm nodule within the right upper lobe. 4. Aortic Atherosclerosis (ICD10-I70.0). 5. Multi vessel coronary artery atherosclerotic calcifications.   07/18/2018 Tumor Marker   Patient's tumor was tested for the following markers: CA-125 Results of the tumor marker test revealed 14.9   07/25/2018 PET scan   1. Regressing right lower lobe peripheral lung lesion, likely resolving inflammatory or atelectatic process. No hypermetabolism to suggest malignancy. I would recommend a follow-up noncontrast chest CT and 4-6 months. Small upper lobe pulmonary nodules are stable. 2. 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   07/31/2018 -  Chemotherapy   The patient started taking rucaparib   10/25/2018 Tumor Marker   Patient's tumor was tested for the following markers: CA-125 Results of the tumor marker test revealed 12.1   11/25/2018 Imaging   1. Right lower lobe nodule has resolved. Additional right lung nodules are stable. 2. Stable hazy nodularity along the left external iliac chain.  3. Hepatomegaly. 4. Aortic atherosclerosis (ICD10-170.0). Coronary artery  calcification. 5. Enlarged pulmonic trunk, indicative of pulmonary arterial hypertension.   01/16/2019 Tumor Marker   Patient's tumor was tested for the following markers: CA-125 Results of the tumor marker test revealed 13.1   05/22/2019 Tumor Marker   Patient's tumor was tested for the following markers: CA-125 Results of the tumor marker test revealed 14.4   06/24/2019 Imaging   Ct chest, abdomen and pelvis 1. There are multiple clustered tiny centrilobular nodules and irregular opacities of the bilateral lower lobes and lingula, increased compared to prior examination, with underlying bandlike scarring of the medial right middle lobe and right lung base (e.g. series 4, image 100, 110, 123). 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  2. Stable 6 mm pulmonary nodule of the right upper lobe (series 4, image 45). Attention on follow-up.   3. Status post hysterectomy and oophorectomy. No evidence of recurrent or metastatic disease in the abdomen or pelvis.   4. There are occasional sigmoid diverticula and focal fat stranding about a diverticulum of the mid sigmoid (series 2, image 103). Correlate for signs and symptoms of acute diverticulitis.   5. Other chronic, incidental, and postoperative findings as detailed above.   06/24/2019 Tumor Marker   Patient's tumor was tested for the following markers: CA-125 Results of the tumor marker test revealed 14.4.     REVIEW OF SYSTEMS:   Constitutional: Denies fevers, chills or abnormal weight loss Eyes: Denies blurriness of vision Ears, nose, mouth, throat, and face: Denies mucositis or sore throat Respiratory: Denies cough, dyspnea or wheezes Cardiovascular: Denies palpitation, chest discomfort or lower extremity swelling Gastrointestinal:  Denies nausea, heartburn or change in bowel habits  Skin: Denies abnormal skin rashes Lymphatics: Denies new lymphadenopathy or easy  bruising Neurological:Denies numbness, tingling or new weaknesses Behavioral/Psych: Mood is stable, no new changes  All other systems were reviewed with the patient and are negative.  I have reviewed the past medical history, past surgical history, social history and family history with the patient and they are unchanged from previous note.  ALLERGIES:  is allergic to penicillins.  MEDICATIONS:  Current Outpatient Medications  Medication Sig Dispense Refill  . Cyanocobalamin (B-12 COMPLIANCE INJECTION) 1000 MCG/ML KIT Inject 1,000 mcg as directed every 30 (thirty) days.    Marland Kitchen FLUoxetine (PROZAC) 20 MG capsule Take 30 mg by mouth daily after breakfast.     . hydrochlorothiazide (HYDRODIURIL) 25 MG tablet Take 25 mg by mouth daily.    Marland Kitchen HYDROcodone-acetaminophen (NORCO) 10-325 MG tablet Take 1 tablet by mouth every 6 (six) hours as needed. 60 tablet 0  . levothyroxine (SYNTHROID) 112 MCG tablet Take 1 tablet (112 mcg total) by mouth daily before breakfast.    . lidocaine-prilocaine (EMLA) cream Apply to affected area once 30 g 3  . losartan (COZAAR) 100 MG tablet Take 100 mg by mouth at bedtime.     . Melatonin 5 MG TABS Take 5-10 mg by mouth at bedtime as needed (sleep).     . metFORMIN (GLUCOPHAGE-XR) 500 MG 24 hr tablet Take 500 mg by mouth 2 (two) times daily.    . polycarbophil (FIBERCON) 625 MG tablet Take 625 mg by mouth daily.    . polyethylene glycol (MIRALAX / GLYCOLAX) 17 g packet Take 17 g by mouth daily.    . rucaparib camsylate (RUBRACA) 300 MG tablet Take 1 tablet (300 mg total) by mouth 2 (two) times daily. 60 tablet 11   No current facility-administered medications for this visit.     PHYSICAL EXAMINATION: ECOG PERFORMANCE STATUS: 1 - Symptomatic but completely ambulatory  Vitals:   06/25/19 0946  BP: (!) 137/55  Pulse: 64  Resp: 18  Temp: 98.3 F (36.8 C)  SpO2: 100%   Filed Weights   06/25/19 0946  Weight: 169 lb (76.7 kg)    GENERAL:alert, no distress and  comfortable SKIN: skin color, texture, turgor are normal, no rashes or significant lesions EYES: normal, Conjunctiva are pink and non-injected, sclera clear OROPHARYNX:no exudate, no erythema and lips, buccal mucosa, and tongue normal  NECK: supple, thyroid normal size, non-tender, without nodularity LYMPH:  no palpable lymphadenopathy in the cervical, axillary or inguinal LUNGS: clear to auscultation and percussion with normal breathing effort HEART: regular rate & rhythm and no murmurs and no lower extremity edema ABDOMEN:abdomen soft, non-tender and normal bowel sounds Musculoskeletal:no cyanosis of digits and no clubbing  NEURO: alert & oriented x 3 with fluent speech, no focal motor/sensory deficits  LABORATORY DATA:  I have reviewed the data as listed    Component Value Date/Time   NA 139 06/24/2019 0915   K 4.5 06/24/2019 0915   CL 102 06/24/2019 0915   CO2 26 06/24/2019 0915   GLUCOSE 128 (H) 06/24/2019 0915   BUN 21 06/24/2019 0915   CREATININE 0.96 06/24/2019 0915   CREATININE 0.93 02/13/2019 1024   CALCIUM 9.9 06/24/2019 0915   PROT 6.9 06/24/2019 0915   ALBUMIN 4.0 06/24/2019 0915   AST 34 06/24/2019 0915   AST 26 02/13/2019 1024   ALT 46 (H) 06/24/2019 0915   ALT 23 02/13/2019 1024   ALKPHOS 74 06/24/2019 0915   BILITOT 0.8 06/24/2019 0915   BILITOT  0.8 02/13/2019 1024   GFRNONAA 57 (L) 06/24/2019 0915   GFRNONAA 59 (L) 02/13/2019 1024   GFRAA >60 06/24/2019 0915   GFRAA >60 02/13/2019 1024    No results found for: SPEP, UPEP  Lab Results  Component Value Date   WBC 5.6 06/24/2019   NEUTROABS 3.5 06/24/2019   HGB 11.4 (L) 06/24/2019   HCT 34.3 (L) 06/24/2019   MCV 99.1 06/24/2019   PLT 180 06/24/2019      Chemistry      Component Value Date/Time   NA 139 06/24/2019 0915   K 4.5 06/24/2019 0915   CL 102 06/24/2019 0915   CO2 26 06/24/2019 0915   BUN 21 06/24/2019 0915   CREATININE 0.96 06/24/2019 0915   CREATININE 0.93 02/13/2019 1024       Component Value Date/Time   CALCIUM 9.9 06/24/2019 0915   ALKPHOS 74 06/24/2019 0915   AST 34 06/24/2019 0915   AST 26 02/13/2019 1024   ALT 46 (H) 06/24/2019 0915   ALT 23 02/13/2019 1024   BILITOT 0.8 06/24/2019 0915   BILITOT 0.8 02/13/2019 1024       RADIOGRAPHIC STUDIES: I have reviewed multiple imaging studies with the patient I have personally reviewed the radiological images as listed and agreed with the findings in the report. Ct Chest W Contrast  Result Date: 06/24/2019 CLINICAL DATA:  Follow-up ovarian cancer, chemotherapy, pulmonary nodules EXAM: CT CHEST, ABDOMEN, AND PELVIS WITH CONTRAST TECHNIQUE: Multidetector CT imaging of the chest, abdomen and pelvis was performed following the standard protocol during bolus administration of intravenous contrast. CONTRAST:  170m OMNIPAQUE IOHEXOL 300 MG/ML SOLN, additional oral enteric contrast COMPARISON:  11/25/2018, 07/24/2018 FINDINGS: CT CHEST FINDINGS Cardiovascular: Right chest port catheter. Aortic atherosclerosis. Normal heart size. Three-vessel coronary artery calcifications. No pericardial effusion. Mediastinum/Nodes: No enlarged mediastinal, hilar, or axillary lymph nodes. Thyroid gland, trachea, and esophagus demonstrate no significant findings. Lungs/Pleura: There are multiple clustered tiny centrilobular nodules and irregular opacities of the bilateral lower lobes and lingula, increased compared to prior examination, with underlying bandlike scarring of the medial right middle lobe and right lung base (e.g. series 4, image 100, 110, 123). Stable 6 mm pulmonary nodule of the right upper lobe (series 4, image 45). There is a definitively benign, calcified fissural nodule of the right lower lobe. No pleural effusion or pneumothorax. Musculoskeletal: No chest wall mass or suspicious bone lesions identified. CT ABDOMEN PELVIS FINDINGS Hepatobiliary: No focal liver abnormality is seen. Status post cholecystectomy. No biliary  dilatation. Pancreas: Unremarkable. No pancreatic ductal dilatation or surrounding inflammatory changes. Spleen: Normal in size without significant abnormality. Adrenals/Urinary Tract: Adrenal glands are unremarkable. Stable, benign intermediate attenuation (HU = 39) exophytic lesion of the superior pole of the right kidney (series 2, image 61). Kidneys are otherwise normal, without renal calculi, solid lesion, or hydronephrosis. Bladder is unremarkable. Stomach/Bowel: Stomach is within normal limits. Appendix appears normal. Large burden of stool in the colon. There are occasional sigmoid diverticula and focal fat stranding about a diverticulum of mid sigmoid (series 2, image 103). Vascular/Lymphatic: Aortic atherosclerosis. Unchanged small prominent periaortic retroperitoneal lymph nodes. Reproductive: Status post hysterectomy and oophorectomy. Other: No abdominal wall hernia or abnormality. No abdominopelvic ascites. Musculoskeletal: No acute or significant osseous findings. IMPRESSION: 1. There are multiple clustered tiny centrilobular nodules and irregular opacities of the bilateral lower lobes and lingula, increased compared to prior examination, with underlying bandlike scarring of the medial right middle lobe and right lung base (e.g. series 4, image 100,  110, 123). 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 2. Stable 6 mm pulmonary nodule of the right upper lobe (series 4, image 45). Attention on follow-up. 3. Status post hysterectomy and oophorectomy. No evidence of recurrent or metastatic disease in the abdomen or pelvis. 4. There are occasional sigmoid diverticula and focal fat stranding about a diverticulum of the mid sigmoid (series 2, image 103). Correlate for signs and symptoms of acute diverticulitis. 5. Other chronic, incidental, and postoperative findings as detailed above. Electronically Signed   By: Eddie Candle M.D.   On: 06/24/2019 11:54    Ct Abdomen Pelvis W Contrast  Result Date: 06/24/2019 CLINICAL DATA:  Follow-up ovarian cancer, chemotherapy, pulmonary nodules EXAM: CT CHEST, ABDOMEN, AND PELVIS WITH CONTRAST TECHNIQUE: Multidetector CT imaging of the chest, abdomen and pelvis was performed following the standard protocol during bolus administration of intravenous contrast. CONTRAST:  134m OMNIPAQUE IOHEXOL 300 MG/ML SOLN, additional oral enteric contrast COMPARISON:  11/25/2018, 07/24/2018 FINDINGS: CT CHEST FINDINGS Cardiovascular: Right chest port catheter. Aortic atherosclerosis. Normal heart size. Three-vessel coronary artery calcifications. No pericardial effusion. Mediastinum/Nodes: No enlarged mediastinal, hilar, or axillary lymph nodes. Thyroid gland, trachea, and esophagus demonstrate no significant findings. Lungs/Pleura: There are multiple clustered tiny centrilobular nodules and irregular opacities of the bilateral lower lobes and lingula, increased compared to prior examination, with underlying bandlike scarring of the medial right middle lobe and right lung base (e.g. series 4, image 100, 110, 123). Stable 6 mm pulmonary nodule of the right upper lobe (series 4, image 45). There is a definitively benign, calcified fissural nodule of the right lower lobe. No pleural effusion or pneumothorax. Musculoskeletal: No chest wall mass or suspicious bone lesions identified. CT ABDOMEN PELVIS FINDINGS Hepatobiliary: No focal liver abnormality is seen. Status post cholecystectomy. No biliary dilatation. Pancreas: Unremarkable. No pancreatic ductal dilatation or surrounding inflammatory changes. Spleen: Normal in size without significant abnormality. Adrenals/Urinary Tract: Adrenal glands are unremarkable. Stable, benign intermediate attenuation (HU = 39) exophytic lesion of the superior pole of the right kidney (series 2, image 61). Kidneys are otherwise normal, without renal calculi, solid lesion, or hydronephrosis. Bladder is  unremarkable. Stomach/Bowel: Stomach is within normal limits. Appendix appears normal. Large burden of stool in the colon. There are occasional sigmoid diverticula and focal fat stranding about a diverticulum of mid sigmoid (series 2, image 103). Vascular/Lymphatic: Aortic atherosclerosis. Unchanged small prominent periaortic retroperitoneal lymph nodes. Reproductive: Status post hysterectomy and oophorectomy. Other: No abdominal wall hernia or abnormality. No abdominopelvic ascites. Musculoskeletal: No acute or significant osseous findings. IMPRESSION: 1. There are multiple clustered tiny centrilobular nodules and irregular opacities of the bilateral lower lobes and lingula, increased compared to prior examination, with underlying bandlike scarring of the medial right middle lobe and right lung base (e.g. series 4, image 100, 110, 123). 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 2. Stable 6 mm pulmonary nodule of the right upper lobe (series 4, image 45). Attention on follow-up. 3. Status post hysterectomy and oophorectomy. No evidence of recurrent or metastatic disease in the abdomen or pelvis. 4. There are occasional sigmoid diverticula and focal fat stranding about a diverticulum of the mid sigmoid (series 2, image 103). Correlate for signs and symptoms of acute diverticulitis. 5. Other chronic, incidental, and postoperative findings as detailed above. Electronically Signed   By: AEddie CandleM.D.   On: 06/24/2019 11:54    All questions were answered. The patient knows to call the clinic with any problems, questions or  concerns. No barriers to learning was detected.  I spent 15 minutes counseling the patient face to face. The total time spent in the appointment was 20 minutes and more than 50% was on counseling and review of test results  Heath Lark, MD 06/25/2019 12:46 PM

## 2019-06-26 ENCOUNTER — Inpatient Hospital Stay: Payer: Medicare HMO | Admitting: Gynecologic Oncology

## 2019-06-26 ENCOUNTER — Telehealth: Payer: Self-pay | Admitting: *Deleted

## 2019-06-26 NOTE — Telephone Encounter (Signed)
Patient needs appt with JE abnormal CT

## 2019-06-26 NOTE — Telephone Encounter (Signed)
-----   Message from [place], MD sent at 06/26/2019  5:45 AM EDT ----- Regarding: FW: persistent abnormal Ct scan of the lung Hi [name] -Would be happy to see her again.  Kimba Lottes- please schedule patient for a follow up with me in my next available spot. Prefer regular or mychart visit.  JE ----- Message ----- From: Heath Lark, MD Sent: 06/25/2019   9:56 AM EDT To: Margaretha Seeds, MD Subject: persistent abnormal Ct scan of the lung        Hi Dr. Loanne Drilling,  I just saw her today She had recent cough but that had resolved She has persistent lung changes on CT; this is not typical for side-effects from her medication Would you mind see her back? SHe told me she did not complete her evaluation 6 months ago  Thanks, [employer]

## 2019-06-26 NOTE — Telephone Encounter (Signed)
Patient called back she is scheduled for 9/17 with JE  Nothing further needed

## 2019-07-01 ENCOUNTER — Ambulatory Visit: Payer: Medicare HMO | Admitting: Hematology and Oncology

## 2019-07-03 ENCOUNTER — Other Ambulatory Visit: Payer: Self-pay

## 2019-07-03 ENCOUNTER — Telehealth: Payer: Self-pay | Admitting: Pulmonary Disease

## 2019-07-03 ENCOUNTER — Encounter: Payer: Self-pay | Admitting: Pulmonary Disease

## 2019-07-03 ENCOUNTER — Ambulatory Visit: Payer: Medicare HMO | Admitting: Pulmonary Disease

## 2019-07-03 VITALS — BP 128/54 | HR 71 | Temp 97.5°F | Ht 64.0 in | Wt 167.2 lb

## 2019-07-03 DIAGNOSIS — Z23 Encounter for immunization: Secondary | ICD-10-CM

## 2019-07-03 DIAGNOSIS — J470 Bronchiectasis with acute lower respiratory infection: Secondary | ICD-10-CM | POA: Diagnosis not present

## 2019-07-03 MED ORDER — FLUTTER DEVI: 0 refills | Status: DC

## 2019-07-03 MED ORDER — LEVOFLOXACIN 750 MG PO TABS: 750.0000 mg | ORAL_TABLET | Freq: Every day | ORAL | 0 refills | Status: DC

## 2019-07-03 MED ORDER — ALBUTEROL SULFATE HFA 108 (90 BASE) MCG/ACT IN AERS: 2.0000 | INHALATION_SPRAY | Freq: Four times a day (QID) | RESPIRATORY_TRACT | 6 refills | Status: DC | PRN

## 2019-07-03 MED ORDER — PREDNISONE 10 MG PO TABS: 30.0000 mg | ORAL_TABLET | Freq: Every day | ORAL | 0 refills | Status: DC

## 2019-07-03 NOTE — Patient Instructions (Addendum)
Atypical pulmonary infection on CT Bronchiectasis Take Levaquin as instructed Take prednisone 30mg  x 5 days Collect sputum samples for testing Consider repeat CT in the future for worsening symptoms  If your symptoms are persistent, we will consider bronchoscopy for further evaluation  We will arrange for pulmonary function test  Order albuterol inhaler and flutter valve twice a day  Follow-up with me in 3 months

## 2019-07-03 NOTE — Telephone Encounter (Signed)
Called and spoke to pt. Pt states she has already left from her OV today but wasn't informed that she needed to have the labs done in our office prior to her leaving. Advised pt she could come here or the [facility] lab to have the results collected. Pt verbalized understanding and denied any further questions or concerns at this time.

## 2019-07-03 NOTE — Progress Notes (Signed)
Subjective:   PATIENT ID: [name] GENDER: female DOB: [birth year], MRN: [id number]   HPI  Chief Complaint  Patient presents with  . Follow-up    ct scan results    Reason for Visit: Follow-up for new CT findings concerning for atypical infection  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 Daughter has accompanied her to her visit today.  [name] is a 78 year old female with stage II ovarian cancer s/p surgery/chemotherapy, hypothyroidism and chemotherapy-induced neuropathy who presents for pulmonary follow-up.  She was previously seen for abnormal CT suggesting pulmonary hypertension. Echocardiogram was obtained for evaluation and did not demonstrate findings supporting PH but did show chronic diastolic heart failure. She has had prior complaints of worsening dyspnea but denies any shortness of breath unless associated high levels of exertion. She also reports general fatigue and in the last year with interval development of productive cough with thick white mucous that occurs throughout the day but is worst in the morning and at night. Denies any triggers that worsen the cough. Denies anything that improves the cough. Denies fevers or chills. Denies night sweats or unexplained weight loss.  I have personally reviewed patient's past medical/family/social history/allergies/current medications.  Past Medical History:  Diagnosis Date  . Arthritis   . Diabetes mellitus without complication ([place])    type 2   . GERD (gastroesophageal reflux disease)   . Hypertension   . Hypothyroidism   . Ovarian cancer ([place]) 01/31/2018  . Pneumonia    its been 4 years   . PONV (postoperative nausea and vomiting)   . Vitamin B 12 deficiency      Family History  Problem Relation Age of Onset  . Lymphoma Mother 35  . Bladder Cancer Sister 69  . Prostate cancer Brother 45       has  surgery right away after dx  . Leukemia Brother        24's  . Breast cancer Other 3       niece, GT 2017 reportedly neg  . Prostate cancer Father 27       found on autopsy at death (40)  . Lung cancer Maternal Aunt   . Esophageal cancer Maternal Uncle   . Other Maternal Grandfather 75       cerebral hemmhorage  . Lung cancer Cousin   . Lung cancer Cousin   . Leukemia Cousin      Social History   Occupational History  . Occupation: retired Glass blower/designer  Tobacco Use  . Smoking status: Never Smoker  . Smokeless tobacco: Never Used  Substance and Sexual Activity  . Alcohol use: No  . Drug use: No  . Sexual activity: Not on file    Allergies  Allergen Reactions  . Penicillins Anaphylaxis    Has patient had a PCN reaction causing immediate rash, facial/tongue/throat swelling, SOB or lightheadedness with hypotension: Yes Has patient had a PCN reaction causing severe rash involving mucus membranes or skin necrosis: No Has patient had a PCN reaction that required hospitalization: Yes Has patient had a PCN reaction occurring within the last 10 years: No If all of the above answers are "NO", then may proceed with Cephalosporin use.      Outpatient Medications Prior to Visit  Medication Sig Dispense Refill  . Cyanocobalamin (B-12 COMPLIANCE INJECTION) 1000 MCG/ML KIT Inject 1,000 mcg as directed every 30 (thirty) days.    Marland Kitchen  FLUoxetine (PROZAC) 20 MG capsule Take 30 mg by mouth daily after breakfast.     . hydrochlorothiazide (HYDRODIURIL) 25 MG tablet Take 25 mg by mouth daily.    Marland Kitchen levothyroxine (SYNTHROID) 112 MCG tablet Take 1 tablet (112 mcg total) by mouth daily before breakfast.    . lidocaine-prilocaine (EMLA) cream Apply to affected area once 30 g 3  . losartan (COZAAR) 100 MG tablet Take 100 mg by mouth at bedtime.     . Melatonin 5 MG TABS Take 5-10 mg by mouth at bedtime as needed (sleep).     . metFORMIN (GLUCOPHAGE-XR) 500 MG 24 hr tablet Take 500 mg by mouth 2  (two) times daily.    . polycarbophil (FIBERCON) 625 MG tablet Take 625 mg by mouth daily.    . polyethylene glycol (MIRALAX / GLYCOLAX) 17 g packet Take 17 g by mouth daily.    . rucaparib camsylate (RUBRACA) 300 MG tablet Take 1 tablet (300 mg total) by mouth 2 (two) times daily. 60 tablet 11  . HYDROcodone-acetaminophen (NORCO) 10-325 MG tablet Take 1 tablet by mouth every 6 (six) hours as needed. (Patient not taking: Reported on 07/03/2019) 60 tablet 0   No facility-administered medications prior to visit.     Review of Systems  Constitutional: Positive for malaise/fatigue. Negative for chills, diaphoresis, fever and weight loss.  HENT: Negative for congestion, ear pain and sore throat.   Respiratory: Positive for cough, sputum production and shortness of breath. Negative for hemoptysis and wheezing.   Cardiovascular: Negative for chest pain, palpitations and leg swelling.  Gastrointestinal: Negative for abdominal pain, heartburn and nausea.  Genitourinary: Negative for frequency.  Musculoskeletal: Positive for myalgias. Negative for joint pain.  Skin: Negative for itching and rash.  Neurological: Negative for dizziness, weakness and headaches.  Endo/Heme/Allergies: Does not bruise/bleed easily.  Psychiatric/Behavioral: Negative for depression. The patient is nervous/anxious.      Objective:   Vitals:   07/03/19 1010 07/03/19 1011  BP:  (!) 128/54  Pulse:  71  Temp: (!) 97.5 F (36.4 C)   TempSrc: Temporal   SpO2:  98%  Weight: 167 lb 3.2 oz (75.8 kg)   Height: 5' 4"  (1.626 m)    SpO2: 98 % O2 Device: None (Room air)  Physical Exam: General: Well-appearing, no acute distress HENT: [place], AT, OP clear, MMM Eyes: EOMI, no scleral icterus Lymph: no cervical lymphadenopathy Respiratory: Clear to auscultation bilaterally.  No crackles, wheezing or rales Cardiovascular: RRR, -M/R/G, no JVD GI: BS+, soft, nontender Extremities:-Edema,-tenderness Neuro: AAO x4, CNII-XII  grossly intact Skin: Intact, no rashes or bruising Psych: Normal mood, normal affect Lines: Right chest PAC   Data Reviewed:  Imaging: CT Chest 11/25/18 - Enlarged pulmonary artery trunk. No mediastinal adenopathy. Resolved RLL lung nodule. Unchanged right upper lobe lung nodules.  CT Chest 06/24/19 - Interval increase in tiny pulmonary nodules and opacities in the lower lungs bilaterally. Bronchiectasis present. Stable 12m RUL lung nodule. Calcified RLL nodule.  PFT: None on file  Labs: TSH 59 >17 T4 wnl (11/25/18) Hg 11.6  (11/25/18)  Imaging, labs and test noted above have been reviewed independently by me.    Assessment & Plan:   Discussion: [age]year old female never smoker with ovarian cancer s/p chemotherapy who presents for follow-up of abnormal CT Chest concerning for atypical infection. Immunoglobulins reviewed and within normal range.  Atypical pulmonary infection on CT Bronchiectasis Take Levaquin as instructed Take prednisone 329mx 5 days Collect sputum samples for testing  Consider repeat CT in the future for worsening symptoms  If your symptoms are persistent, we will consider bronchoscopy for further evaluation  We will arrange for pulmonary function test  Order albuterol inhaler and flutter valve twice a day  Health Maintenance Pneumonia: Prevnar 03/23/14 Influenza 07/29/18  Orders Placed This Encounter  Procedures  .  MYCOBACTERIA, CULTURE, WITH FLUOROCHROME SMEAR    Standing Status:   Future    Standing Expiration Date:   07/02/2020  . Fungus Culture with Smear    Standing Status:   Future    Standing Expiration Date:   07/02/2020  . Respiratory or Resp and Sputum Culture    Standing Status:   Future    Standing Expiration Date:   07/02/2020  . Flu Vaccine QUAD High Dose(Fluad)  . IgG, IgA, IgM    Standing Status:   Future    Number of Occurrences:   1    Standing Expiration Date:   07/02/2020  . Pulmonary function test    Standing Status:   Future     Standing Expiration Date:   07/02/2020    Order Specific Question:   Where should this test be performed?    Answer:   [facility]    Order Specific Question:   Full PFT: includes the following: basic spirometry, spirometry pre & post bronchodilator, diffusion capacity (DLCO), lung volumes    Answer:   Full PFT   Meds ordered this encounter  Medications  . predniSONE (DELTASONE) 10 MG tablet    Sig: Take 3 tablets (30 mg total) by mouth daily with breakfast.    Dispense:  15 tablet    Refill:  0  . levofloxacin (LEVAQUIN) 750 MG tablet    Sig: Take 1 tablet (750 mg total) by mouth daily.    Dispense:  7 tablet    Refill:  0  . albuterol (VENTOLIN HFA) 108 (90 Base) MCG/ACT inhaler    Sig: Inhale 2 puffs into the lungs every 6 (six) hours as needed for wheezing or shortness of breath.    Dispense:  6.7 g    Refill:  6  . Respiratory Therapy Supplies (FLUTTER) DEVI    Sig: 10 times twice a day and as needed    Dispense:  1 each    Refill:  0    Return in about 3 months (around 10/02/2019).  [place], MD [facility] 07/08/2019 9:23 PM  Office Number [phone]

## 2019-07-04 DIAGNOSIS — A319 Mycobacterial infection, unspecified: Secondary | ICD-10-CM

## 2019-07-04 HISTORY — DX: Mycobacterial infection, unspecified: A31.9

## 2019-07-04 LAB — IGG, IGA, IGM
IgG (Immunoglobin G), Serum: 634 mg/dL (ref 600–1540)
IgM, Serum: 73 mg/dL (ref 50–300)
Immunoglobulin A: 140 mg/dL (ref 70–320)

## 2019-07-08 DIAGNOSIS — J479 Bronchiectasis, uncomplicated: Secondary | ICD-10-CM

## 2019-07-08 DIAGNOSIS — J470 Bronchiectasis with acute lower respiratory infection: Secondary | ICD-10-CM | POA: Insufficient documentation

## 2019-07-08 HISTORY — DX: Bronchiectasis, uncomplicated: J47.9

## 2019-07-10 ENCOUNTER — Other Ambulatory Visit: Payer: Medicare HMO

## 2019-07-10 DIAGNOSIS — J470 Bronchiectasis with acute lower respiratory infection: Secondary | ICD-10-CM

## 2019-07-16 MED FILL — RUBRACA 300 MG TAB: 300 | 30 days supply | Qty: 60 | Fill #5

## 2019-07-18 ENCOUNTER — Telehealth: Payer: Self-pay | Admitting: Pulmonary Disease

## 2019-07-18 NOTE — Telephone Encounter (Signed)
Called and spoke to Patient.  Sputum culture results have not been released at this time.  Patient aware someone will contact her with results once available. Nothing further at this time.

## 2019-07-28 ENCOUNTER — Telehealth: Payer: Self-pay | Admitting: Pulmonary Disease

## 2019-07-28 NOTE — Telephone Encounter (Signed)
Patient scheduled for recall in December with PFT, is this ok or do you want her 4 months from October?   JE please advise

## 2019-07-28 NOTE — Telephone Encounter (Signed)
[facility] Telephone Encounter  Discussed sputum results. Negative AFB so far. Reassured patient that her CT Chest was not concerning for cancer. Will arrange for follow-up in 4 months with plan for repeat CT to be ordered on that visit.

## 2019-07-28 NOTE — Telephone Encounter (Signed)
Ok to see in December with PFT. Please let patient know when PFT scheduled.

## 2019-08-11 ENCOUNTER — Other Ambulatory Visit: Payer: Self-pay

## 2019-08-18 MED FILL — RUBRACA 300 MG TAB: 300 | 30 days supply | Qty: 60 | Fill #6

## 2019-08-19 ENCOUNTER — Other Ambulatory Visit: Payer: Self-pay

## 2019-08-19 ENCOUNTER — Telehealth: Payer: Self-pay | Admitting: Hematology and Oncology

## 2019-08-19 ENCOUNTER — Encounter: Payer: Self-pay | Admitting: Hematology and Oncology

## 2019-08-19 ENCOUNTER — Inpatient Hospital Stay: Payer: Medicare HMO

## 2019-08-19 ENCOUNTER — Inpatient Hospital Stay: Payer: Medicare HMO | Attending: Gynecologic Oncology | Admitting: Hematology and Oncology

## 2019-08-19 DIAGNOSIS — G620 Drug-induced polyneuropathy: Secondary | ICD-10-CM | POA: Insufficient documentation

## 2019-08-19 DIAGNOSIS — T451X5A Adverse effect of antineoplastic and immunosuppressive drugs, initial encounter: Secondary | ICD-10-CM | POA: Diagnosis not present

## 2019-08-19 DIAGNOSIS — C562 Malignant neoplasm of left ovary: Secondary | ICD-10-CM | POA: Diagnosis not present

## 2019-08-19 DIAGNOSIS — Z9079 Acquired absence of other genital organ(s): Secondary | ICD-10-CM | POA: Diagnosis not present

## 2019-08-19 DIAGNOSIS — Z79899 Other long term (current) drug therapy: Secondary | ICD-10-CM | POA: Insufficient documentation

## 2019-08-19 DIAGNOSIS — D6481 Anemia due to antineoplastic chemotherapy: Secondary | ICD-10-CM

## 2019-08-19 DIAGNOSIS — Z452 Encounter for adjustment and management of vascular access device: Secondary | ICD-10-CM | POA: Diagnosis not present

## 2019-08-19 DIAGNOSIS — R918 Other nonspecific abnormal finding of lung field: Secondary | ICD-10-CM | POA: Insufficient documentation

## 2019-08-19 DIAGNOSIS — Z90710 Acquired absence of both cervix and uterus: Secondary | ICD-10-CM | POA: Insufficient documentation

## 2019-08-19 DIAGNOSIS — Z7984 Long term (current) use of oral hypoglycemic drugs: Secondary | ICD-10-CM | POA: Diagnosis not present

## 2019-08-19 DIAGNOSIS — Z90722 Acquired absence of ovaries, bilateral: Secondary | ICD-10-CM | POA: Diagnosis not present

## 2019-08-19 HISTORY — DX: Adverse effect of antineoplastic and immunosuppressive drugs, initial encounter: D64.81

## 2019-08-19 LAB — COMPREHENSIVE METABOLIC PANEL
ALT: 21 U/L (ref 0–44)
AST: 19 U/L (ref 15–41)
Albumin: 3.9 g/dL (ref 3.5–5.0)
Alkaline Phosphatase: 58 U/L (ref 38–126)
Anion gap: 10 (ref 5–15)
BUN: 27 mg/dL — ABNORMAL HIGH (ref 8–23)
CO2: 26 mmol/L (ref 22–32)
Calcium: 9.6 mg/dL (ref 8.9–10.3)
Chloride: 103 mmol/L (ref 98–111)
Creatinine, Ser: 0.98 mg/dL (ref 0.44–1.00)
GFR calc Af Amer: >60 mL/min (ref >60)
GFR calc non Af Amer: 55 mL/min — ABNORMAL LOW (ref >60)
Glucose, Bld: 92 mg/dL (ref 70–99)
Potassium: 4.2 mmol/L (ref 3.5–5.1)
Sodium: 139 mmol/L (ref 135–145)
Total Bilirubin: 0.9 mg/dL (ref 0.3–1.2)
Total Protein: 6.5 g/dL (ref 6.5–8.1)

## 2019-08-19 LAB — CBC WITH DIFFERENTIAL/PLATELET
Abs Immature Granulocytes: 0.02 10*3/uL (ref 0.00–0.07)
Basophils Absolute: 0 10*3/uL (ref 0.0–0.1)
Basophils Relative: 1 %
Eosinophils Absolute: 0.1 10*3/uL (ref 0.0–0.5)
Eosinophils Relative: 2 %
HCT: 34.8 % — ABNORMAL LOW (ref 36.0–46.0)
Hemoglobin: 11.8 g/dL — ABNORMAL LOW (ref 12.0–15.0)
Immature Granulocytes: 0 %
Lymphocytes Relative: 29 %
Lymphs Abs: 2 10*3/uL (ref 0.7–4.0)
MCH: 33.5 pg (ref 26.0–34.0)
MCHC: 33.9 g/dL (ref 30.0–36.0)
MCV: 98.9 fL (ref 80.0–100.0)
Monocytes Absolute: 0.5 10*3/uL (ref 0.1–1.0)
Monocytes Relative: 7 %
Neutro Abs: 4.2 10*3/uL (ref 1.7–7.7)
Neutrophils Relative %: 61 %
Platelets: 167 10*3/uL (ref 150–400)
RBC: 3.52 MIL/uL — ABNORMAL LOW (ref 3.87–5.11)
RDW: 15.2 % (ref 11.5–15.5)
WBC: 6.9 10*3/uL (ref 4.0–10.5)
nRBC: 0 % (ref 0.0–0.2)

## 2019-08-19 MED ORDER — HEPARIN SOD (PORK) LOCK FLUSH 100 UNIT/ML IV SOLN
250.0000 [IU] | Freq: Once | INTRAVENOUS | Status: AC
  Administered 2019-08-19: 10:00:00 500 [IU]
  Filled 2019-08-19: qty 5

## 2019-08-19 MED ORDER — SODIUM CHLORIDE 0.9% FLUSH
10.0000 mL | Freq: Once | INTRAVENOUS | Status: AC
  Administered 2019-08-19: 10:00:00 10 mL
  Filled 2019-08-19: qty 10

## 2019-08-19 NOTE — Assessment & Plan Note (Signed)
Clinically, she have no signs or symptoms to suggest cancer recurrence Tumor markers pending; we will call her tomorrow with test results Her most recent CT imaging show no evidence of cancer recurrence. I do not plan to repeat imaging study until next year I will see her again in 8 weeks for further follow-up

## 2019-08-19 NOTE — Telephone Encounter (Signed)
Scheduled appt per 11/3 sch message - pt is aware of appt date and time   

## 2019-08-19 NOTE — Assessment & Plan Note (Signed)

## 2019-08-19 NOTE — Progress Notes (Signed)
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 The patient denies recent history of bleeding such as epistaxis, hematuria or hematochezia. She is asymptomatic from the anemia. I will observe for now.  She does not require transfusion now. I will continue the chemotherapy at current dose without dosage adjustment.  If the anemia gets progressive worse in the future, I might have to delay her treatment or adjust the chemotherapy dose.    No orders of the defined types were placed in this encounter.   INTERVAL HISTORY: Please see below for problem oriented charting. She returns for further follow-up She is feeling well She is bothered occasionally with neuropathy that comes and goes She continues to have shoulder pain but she did have history of rotator cuff injury in the past She received an injection recently She denies  recent cough, chest pain or shortness of breath.  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 - TUMOR INVOLVES OVARY SURFACE AND LEFT FALLOPIAN TUBE. - SEE ONCOLOGY TABLE. 2. Mesentery, small bowel mesentery biopsy #1 - BENIGN FIBROADIPOSE TISSUE. 3. Ovary and fallopian tube, right - BENIGN OVARY WITH INCLUSION CYSTS. - BENIGN FALLOPIAN TUBE WITH PARATUBAL CYSTS AND ADENOFIBROMA. 4. Omentum, resection for tumor - BENIGN ADIPOSE TISSUE. 5. Peritoneum, biopsy, left diaphragmatic - BENIGN PERITONEAL TYPE TISSUE. 6. Peritoneum, biopsy, right diaphragmatic - BENIGN PERITONEAL TYPE TISSUE. 7. Mesentery, small bowel mesenteric biopsy #2 - BENIGN FIBROADIPOSE TISSUE. 8. Lymph nodes, regional resection, right pelvic - FIVE OF FIVE LYMPH NODES NEGATIVE FOR CARCINOMA (0/5). 9. Lymph nodes, regional resection, left pelvic - FOUR OF FOUR LYMPH NODES NEGATIVE FOR CARCINOMA (0/4). 10. Peritoneum, biopsy, left gutter - BENIGN PERITONEAL TYPE TISSUE. 11. Peritoneum, biopsy, bladder - BENIGN PERITONEAL TYPE TISSUE WITH ACUTE INFLAMMATION AND CALCIFICATIONS.  12. Peritoneum, biopsy, cul-de-sac - BENIGN PERITONEAL TYPE TISSUE WITH ACUTE INFLAMMATION AND CALCIFICATIONS. 13. Soft tissue, biopsy, right gutter - BENIGN FIBROADIPOSE TISSUE. 14. Lymph node, biopsy, right para-aortic - TWO OF TWO LYMPH NODES NEGATIVE FOR CARCINOMA (0/2). 15. Lymph node, biopsy,  left para-aortic - ONE OF ONE LYMPH NODES NEGATIVE FOR CARCINOMA  (0/1). Microscopic Comment 1. OVARY Specimen(s): Left ovary and fallopian tube. Procedure: (including lymph node sampling): Bilateral salpingo-oophorectomy with omental and peritoneal biopsies and lymph node biopsies. Primary tumor site (including laterality): Left ovary. Ovarian surface involvement: Present. Ovarian capsule intact without fragmentation: Intact. Maximum tumor size (cm): 11 cm total. Histologic type: Serous cystadenocarcinoma. Grade: High grade (low grade areas also present). Peritoneal implants: (specify invasive or non-invasive): N/A. Pelvic extension (list additional structures on separate lines and if involved): Left fallopian tube. Lymph nodes: number examined 12 ; number positive 0 TNM code: pT2a, pN0, pMX FIGO Stage (based on pathologic findings, needs clinical correlation): IIA Comments: None.   01/31/2018 Pathology Results   PERITONEAL WASHING (SPECIMEN 1 OF 1 COLLECTED 01/31/18): MALIGNANT CELLS PRESENT CONSISTENT WITH METASTATIC CARCINOMA.   01/31/2018 Surgery   Procedure(s) Performed:  1. Robotic BSO and washings. 2. Exploratory laparotomy, Staging including infragastric omentectomy, Bilateral pelvic and paraaortic lymphadenectomy, pertioneal biopsies.  Specimens: Bilateral tubes / ovaries, bilateral pelvic and paraaortic lymph nodes, peritoneal biopsies, washings and omentum.  Operative Findings: The left adnexa was adherent to the left pelvic peritoneum suspected due to the inferior retraction from the vaginal hysterectomy. Intraoperative leakage of cystic fluid from left ovary.  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  The right adnexa grossly appeared normal although slightly enlarged for her age.  There were some indurated lymph nodes but no overtly malignant lymph nodes were suspected.  Small bowel mesentery had 3-4 superficial possible implants.  1 of these was sent for frozen section returned benign.   She did have adhesive disease from her open cholecystectomy in the right upper quadrant.  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 16 mm exophytic lesion upper pole right kidney has attenuation too high to be a simple cyst. This may be a cyst complicated by proteinaceous debris or hemorrhage, but renal cell carcinoma is a concern. Routine outpatient follow-up MRI of the abdomen without and with contrast recommended after resolution of patient's acute symptoms (so she is better able to participate with positioning and breath holding). 2. Mild edema/possible minimal hemorrhage in the extraperitoneal soft tissues of the pelvic for tracking up both pelvic sidewalls. Imaging appearance is not outside of the spectrum of findings expected 6 days after the reported surgery. Trace interloop mesenteric and free fluid is identified in the peritoneal cavity in there is some mild edema in the omentum. There is no organized or rim enhancing collection to suggest evolving abscess. 3. Gas in the extraperitoneal soft tissues of the left lower quadrant is associated with gas in the subcutaneous fat of the lower left anterior abdominal wall. This is not unexpected on postoperative day 6. No evidence for intraperitoneal free air. 4. No CT features to suggest small bowel obstruction. Oral contrast material has migrated about halfway through the small bowel loops but there is no differential distention of proximal versus distal small bowel. The colon is diffusely distended and fluid-filled proximally, but formed stool is noted in  the  distal colon. Given apparent slow migration of contrast and fluid-filled small and large bowel loops, a component of ileus is possible.   02/14/2018 Imaging   MR abdomen Benign Bosniak category 2 cyst in upper pole of right kidney, corresponding with lesion seen on previous CT. No evidence of renal neoplasm or other significant abnormality.    02/18/2018 Cancer Staging   Staging form: Ovary, Fallopian Tube, and Primary Peritoneal Carcinoma, AJCC 8th Edition - Pathologic: Stage II (pT2, pN0, cM0) - Signed by Heath Lark, MD on 02/18/2018   02/28/2018 Procedure   Status post right IJ port catheter placement. 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 For last cycle, taxol was omitted   07/18/2018 Imaging   1. No evidence for residual or recurrent tumor within the abdomen or pelvis. There has been interval resolution edema and fluid within the peritoneal cavity. 2. New pulmonary nodule is identified within the right lower lobe measuring 1.1 cm, image 87/4. Consider one of the following in 3 months for both low-risk and high-risk individuals: (a) repeat chest CT, (b) follow-up PET-CT, or (c) tissue sampling. This recommendation follows the consensus statement: Guidelines for Management of Incidental Pulmonary Nodules Detected on CT Images: From the Fleischner Society 2017; Radiology 2017; 284:228-243. 3. Stable 7 mm nodule within the right upper lobe. 4. Aortic Atherosclerosis (ICD10-I70.0). 5. Multi vessel coronary artery atherosclerotic calcifications.   07/18/2018 Tumor Marker   Patient's tumor was tested for the following markers: CA-125 Results of the tumor marker test revealed 14.9   07/25/2018 PET scan   1. Regressing right lower lobe peripheral  lung lesion, likely resolving inflammatory or atelectatic process. No hypermetabolism to suggest malignancy. I would recommend a follow-up noncontrast chest CT and 4-6 months. Small upper lobe pulmonary nodules are stable. 2. 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   07/31/2018 -  Chemotherapy   The patient started taking rucaparib   10/25/2018 Tumor Marker   Patient's tumor was tested for the following markers: CA-125 Results of the tumor marker test revealed 12.1   11/25/2018 Imaging   1. Right lower lobe nodule has resolved. Additional right lung nodules are stable. 2. Stable hazy nodularity along the left external iliac chain.  3. Hepatomegaly. 4. Aortic atherosclerosis (ICD10-170.0). Coronary artery calcification. 5. Enlarged pulmonic trunk, indicative of pulmonary arterial hypertension.   01/16/2019 Tumor Marker   Patient's tumor was tested for the following markers: CA-125 Results of the tumor marker test revealed 13.1   05/22/2019 Tumor Marker   Patient's tumor was tested for the following markers: CA-125 Results of the tumor marker test revealed 14.4   06/24/2019 Imaging   Ct chest, abdomen and pelvis 1. There are multiple clustered tiny centrilobular nodules and irregular opacities of the bilateral lower lobes and lingula, increased compared to prior examination, with underlying bandlike scarring of the medial right middle lobe and right lung base (e.g. series 4, image 100, 110, 123). 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  2. Stable 6 mm pulmonary nodule of the right upper lobe (series 4, image 45). Attention on follow-up.   3. Status post hysterectomy and oophorectomy. No evidence of recurrent or metastatic disease  in the abdomen or pelvis.   4. There are occasional sigmoid diverticula and focal fat stranding about a  diverticulum of the mid sigmoid (series 2, image 103). Correlate for signs and symptoms of acute diverticulitis.   5. Other chronic, incidental, and postoperative findings as detailed above.   06/24/2019 Tumor Marker   Patient's tumor was tested for the following markers: CA-125 Results of the tumor marker test revealed 14.4.     REVIEW OF SYSTEMS:   Constitutional: Denies fevers, chills or abnormal weight loss Eyes: Denies blurriness of vision Ears, nose, mouth, throat, and face: Denies mucositis or sore throat Respiratory: Denies cough, dyspnea or wheezes Cardiovascular: Denies palpitation, chest discomfort or lower extremity swelling Gastrointestinal:  Denies nausea, heartburn or change in bowel habits Skin: Denies abnormal skin rashes Lymphatics: Denies new lymphadenopathy or easy bruising Neurological:Denies numbness, tingling or new weaknesses Behavioral/Psych: Mood is stable, no new changes  All other systems were reviewed with the patient and are negative.  I have reviewed the past medical history, past surgical history, social history and family history with the patient and they are unchanged from previous note.  ALLERGIES:  is allergic to penicillins.  MEDICATIONS:  Current Outpatient Medications  Medication Sig Dispense Refill  . rosuvastatin (CRESTOR) 5 MG tablet Take 5 mg by mouth daily.    Marland Kitchen albuterol (VENTOLIN HFA) 108 (90 Base) MCG/ACT inhaler Inhale 2 puffs into the lungs every 6 (six) hours as needed for wheezing or shortness of breath. 6.7 g 6  . Cyanocobalamin (B-12 COMPLIANCE INJECTION) 1000 MCG/ML KIT Inject 1,000 mcg as directed every 30 (thirty) days.    Marland Kitchen FLUoxetine (PROZAC) 20 MG capsule Take 30 mg by mouth daily after breakfast.     . hydrochlorothiazide (HYDRODIURIL) 25 MG tablet Take 25 mg by mouth daily.    Marland Kitchen HYDROcodone-acetaminophen (NORCO) 10-325 MG tablet Take 1 tablet by mouth every 6 (six) hours as needed. (Patient not taking: Reported on  07/03/2019) 60 tablet 0  . levothyroxine (SYNTHROID) 112 MCG tablet Take 1 tablet (112 mcg total) by mouth daily before breakfast.    . lidocaine-prilocaine (EMLA) cream Apply to affected area once 30 g 3  . losartan (COZAAR) 100 MG tablet Take 100 mg by mouth at bedtime.     . Melatonin 5 MG TABS Take 5-10 mg by mouth at bedtime as needed (sleep).     . metFORMIN (GLUCOPHAGE-XR) 500 MG 24 hr tablet Take 500 mg by mouth 2 (two) times daily.    . polycarbophil (FIBERCON) 625 MG tablet Take 625 mg by mouth daily.    . polyethylene glycol (MIRALAX / GLYCOLAX) 17 g packet Take 17 g by mouth daily.    Marland Kitchen Respiratory Therapy Supplies (FLUTTER) DEVI 10 times twice a day and as needed 1 each 0  . rucaparib camsylate (RUBRACA) 300 MG tablet Take 1 tablet (300 mg total) by mouth 2 (two) times daily. 60 tablet 11   No current facility-administered medications for this visit.     PHYSICAL EXAMINATION: ECOG PERFORMANCE STATUS: 1 - Symptomatic but completely ambulatory  Vitals:   08/19/19 1000  BP: 124/79  Pulse: (!) 58  Resp: 18  Temp: 98.5 F (36.9 C)  SpO2: 99%   Filed Weights   08/19/19 1000  Weight: 164 lb 11.2 oz (74.7 kg)    GENERAL:alert, no distress and comfortable SKIN: skin color, texture, turgor are normal, no rashes or significant lesions EYES: normal, Conjunctiva are pink and non-injected, sclera clear OROPHARYNX:no exudate, no erythema  and lips, buccal mucosa, and tongue normal  NECK: supple, thyroid normal size, non-tender, without nodularity LYMPH:  no palpable lymphadenopathy in the cervical, axillary or inguinal LUNGS: clear to auscultation and percussion with normal breathing effort HEART: regular rate & rhythm and no murmurs and no lower extremity edema ABDOMEN:abdomen soft, non-tender and normal bowel sounds Musculoskeletal:no cyanosis of digits and no clubbing  NEURO: alert & oriented x 3 with fluent speech, no focal motor/sensory deficits  LABORATORY DATA:  I have  reviewed the data as listed    Component Value Date/Time   NA 139 08/19/2019 0952   K 4.2 08/19/2019 0952   CL 103 08/19/2019 0952   CO2 26 08/19/2019 0952   GLUCOSE 92 08/19/2019 0952   BUN 27 (H) 08/19/2019 0952   CREATININE 0.98 08/19/2019 0952   CREATININE 0.93 02/13/2019 1024   CALCIUM 9.6 08/19/2019 0952   PROT 6.5 08/19/2019 0952   ALBUMIN 3.9 08/19/2019 0952   AST 19 08/19/2019 0952   AST 26 02/13/2019 1024   ALT 21 08/19/2019 0952   ALT 23 02/13/2019 1024   ALKPHOS 58 08/19/2019 0952   BILITOT 0.9 08/19/2019 0952   BILITOT 0.8 02/13/2019 1024   GFRNONAA 55 (L) 08/19/2019 0952   GFRNONAA 59 (L) 02/13/2019 1024   GFRAA >60 08/19/2019 0952   GFRAA >60 02/13/2019 1024    No results found for: SPEP, UPEP  Lab Results  Component Value Date   WBC 6.9 08/19/2019   NEUTROABS 4.2 08/19/2019   HGB 11.8 (L) 08/19/2019   HCT 34.8 (L) 08/19/2019   MCV 98.9 08/19/2019   PLT 167 08/19/2019      Chemistry      Component Value Date/Time   NA 139 08/19/2019 0952   K 4.2 08/19/2019 0952   CL 103 08/19/2019 0952   CO2 26 08/19/2019 0952   BUN 27 (H) 08/19/2019 0952   CREATININE 0.98 08/19/2019 0952   CREATININE 0.93 02/13/2019 1024      Component Value Date/Time   CALCIUM 9.6 08/19/2019 0952   ALKPHOS 58 08/19/2019 0952   AST 19 08/19/2019 0952   AST 26 02/13/2019 1024   ALT 21 08/19/2019 0952   ALT 23 02/13/2019 1024   BILITOT 0.9 08/19/2019 0952   BILITOT 0.8 02/13/2019 1024      All questions were answered. The patient knows to call the clinic with any problems, questions or concerns. No barriers to learning was detected.  I spent 15 minutes counseling the patient face to face. The total time spent in the appointment was 20 minutes and more than 50% was on counseling and review of test results  Heath Lark, MD 08/19/2019 10:39 AM

## 2019-08-19 NOTE — Patient Instructions (Signed)

## 2019-08-19 NOTE — Assessment & Plan Note (Signed)
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

## 2019-08-19 NOTE — Assessment & Plan Note (Signed)
She was evaluated extensively by pulmonologist She has completed a course of prednisone and levofloxacin She has appointment to see pulmonologist again for further follow-up Currently, she is not symptomatic She is up-to-date with most of her vaccination I recommend she consult with her primary care doctor whether she would qualify for pneumococcal 23 vaccine again

## 2019-08-20 ENCOUNTER — Telehealth: Payer: Self-pay | Admitting: Oncology

## 2019-08-20 LAB — CA 125: Cancer Antigen (CA) 125: 10.3 U/mL (ref 0.0–38.1)

## 2019-08-20 NOTE — Telephone Encounter (Signed)
[name] called about her lab results. Reviewed her CA 125, CBC and CMP results.

## 2019-08-22 ENCOUNTER — Telehealth: Payer: Self-pay | Admitting: Oncology

## 2019-08-22 NOTE — Telephone Encounter (Signed)
[name] called and said she has been itching since she started taking Crestor.  She has now stopped taking it and is still itching.  She asked if her BUN level of 27 is causing the itching.  Advised her that it should not cause itching.  She said she was reading that Miralax could cause itching and she takes it 4 times a day.  She is going to stop it and take more fiber.  Advised her to call her PCP if the itching continues.

## 2019-08-26 LAB — FUNGUS CULTURE W SMEAR
MICRO NUMBER:: 918590
SMEAR:: NONE SEEN
SPECIMEN QUALITY:: ADEQUATE

## 2019-08-26 LAB — MYCOBACTERIA,CULT W/FLUOROCHROME SMEAR
MICRO NUMBER:: 918591
SMEAR:: NONE SEEN
SPECIMEN QUALITY:: ADEQUATE

## 2019-08-26 LAB — RESPIRATORY CULTURE OR RESPIRATORY AND SPUTUM CULTURE
MICRO NUMBER:: 918592
SPECIMEN QUALITY:: ADEQUATE

## 2019-08-26 LAB — TEST AUTHORIZATION: TEST CODE:: 17823

## 2019-08-26 LAB — SUSCEPTIBILITY,YEAST,COMPREHENSIVE PANEL

## 2019-09-16 ENCOUNTER — Other Ambulatory Visit: Payer: Self-pay

## 2019-09-16 ENCOUNTER — Ambulatory Visit (INDEPENDENT_AMBULATORY_CARE_PROVIDER_SITE_OTHER): Payer: Medicare HMO | Admitting: Pulmonary Disease

## 2019-09-16 ENCOUNTER — Encounter: Payer: Self-pay | Admitting: Pulmonary Disease

## 2019-09-16 DIAGNOSIS — J479 Bronchiectasis, uncomplicated: Secondary | ICD-10-CM | POA: Diagnosis not present

## 2019-09-16 DIAGNOSIS — R9389 Abnormal findings on diagnostic imaging of other specified body structures: Secondary | ICD-10-CM

## 2019-09-16 MED FILL — RUBRACA 300 MG TAB: 300 | 30 days supply | Qty: 60 | Fill #7

## 2019-09-16 NOTE — Progress Notes (Signed)
Virtual Visit via Telephone Note  I connected with [name] on 09/16/19 at  9:00 AM EST by telephone and verified that I am speaking with the correct person using two identifiers.  Location: Patient: Home Provider: [facility]   I discussed the limitations, risks, security and privacy concerns of performing an evaluation and management service by telephone and the availability of in person appointments. I also discussed with the patient that there may be a patient responsible charge related to this service. The patient expressed understanding and agreed to proceed.   I discussed the assessment and treatment plan with the patient. The patient was provided an opportunity to ask questions and all were answered. The patient agreed with the plan and demonstrated an understanding of the instructions.   The patient was advised to call back or seek an in-person evaluation if the symptoms worsen or if the condition fails to improve as anticipated.  I provided 21 minutes of non-face-to-face time during this encounter.   Kaamil Morefield Rodman Pickle, MD   Subjective:   PATIENT ID: [name] GENDER: female DOB: [birth year], MRN: [id number]   HPI  Chief Complaint  Patient presents with  . Follow-up    Reason for Visit: Televisit follow-up   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  Since her last visit on 07/03/2019, she has been treated with Levaquin and prednisone for interval worsening of pulmonary nodules opacities in lower lobes bilaterally CT.  Her sputum cultures and AFB are negative. Fungal cultures did return with candida albicans however not clinical significant as this can be found in oral flora.  At the time she had fatigue and productive cough with thick white mucus that was worse in the morning and at night. Today, she  does not have shortness of breath anymore. She has occasional nocturnal cough that will stop once she clears up some mucous. She noticed her symptoms improved after the steroids and antibiotics. She also has cut back on her gardening and felt this has helped. Denies fevers, chills. Has had good weight gain.  I have personally reviewed patient's past medical/family/social history, allergies and current medications.  Past Medical History:  Diagnosis Date  . Arthritis   . Diabetes mellitus without complication ([place])    type 2   . GERD (gastroesophageal reflux disease)   . Hypertension   . Hypothyroidism   . Ovarian cancer ([place]) 01/31/2018  . Pneumonia    its been 4 years   . PONV (postoperative nausea and vomiting)   . Vitamin B 12 deficiency      Family History  Problem Relation Age of Onset  . Lymphoma Mother 94  . Bladder Cancer Sister 88  . Prostate cancer Brother 70       has surgery right away after dx  . Leukemia Brother        79's  . Breast cancer Other 10       niece, GT 2017 reportedly neg  . Prostate cancer Father 52       found on autopsy at death (5)  . Lung cancer Maternal Aunt   . Esophageal cancer Maternal Uncle   . Other Maternal Grandfather 75       cerebral hemmhorage  . Lung cancer Cousin   . Lung cancer Cousin   . Leukemia Cousin      Social History  Occupational History  . Occupation: retired Glass blower/designer  Tobacco Use  . Smoking status: Never Smoker  . Smokeless tobacco: Never Used  Substance and Sexual Activity  . Alcohol use: No  . Drug use: No  . Sexual activity: Not on file    Allergies  Allergen Reactions  . Penicillins Anaphylaxis    Has patient had a PCN reaction causing immediate rash, facial/tongue/throat swelling, SOB or lightheadedness with hypotension: Yes Has patient had a PCN reaction causing severe rash involving mucus membranes or skin necrosis: No Has patient had a PCN reaction that required hospitalization: Yes Has  patient had a PCN reaction occurring within the last 10 years: No If all of the above answers are "NO", then may proceed with Cephalosporin use.      Outpatient Medications Prior to Visit  Medication Sig Dispense Refill  . albuterol (VENTOLIN HFA) 108 (90 Base) MCG/ACT inhaler Inhale 2 puffs into the lungs every 6 (six) hours as needed for wheezing or shortness of breath. 6.7 g 6  . Cyanocobalamin (B-12 COMPLIANCE INJECTION) 1000 MCG/ML KIT Inject 1,000 mcg as directed every 30 (thirty) days.    Marland Kitchen FLUoxetine (PROZAC) 20 MG capsule Take 30 mg by mouth daily after breakfast.     . hydrochlorothiazide (HYDRODIURIL) 25 MG tablet Take 25 mg by mouth daily.    Marland Kitchen HYDROcodone-acetaminophen (NORCO) 10-325 MG tablet Take 1 tablet by mouth every 6 (six) hours as needed. (Patient not taking: Reported on 07/03/2019) 60 tablet 0  . levothyroxine (SYNTHROID) 112 MCG tablet Take 1 tablet (112 mcg total) by mouth daily before breakfast.    . lidocaine-prilocaine (EMLA) cream Apply to affected area once 30 g 3  . losartan (COZAAR) 100 MG tablet Take 100 mg by mouth at bedtime.     . Melatonin 5 MG TABS Take 5-10 mg by mouth at bedtime as needed (sleep).     . metFORMIN (GLUCOPHAGE-XR) 500 MG 24 hr tablet Take 500 mg by mouth 2 (two) times daily.    . polycarbophil (FIBERCON) 625 MG tablet Take 625 mg by mouth daily.    . polyethylene glycol (MIRALAX / GLYCOLAX) 17 g packet Take 17 g by mouth daily.    Marland Kitchen Respiratory Therapy Supplies (FLUTTER) DEVI 10 times twice a day and as needed 1 each 0  . rosuvastatin (CRESTOR) 5 MG tablet Take 5 mg by mouth daily.    . rucaparib camsylate (RUBRACA) 300 MG tablet Take 1 tablet (300 mg total) by mouth 2 (two) times daily. 60 tablet 11   No facility-administered medications prior to visit.     Review of Systems  Constitutional: Negative for chills, diaphoresis, fever, malaise/fatigue and weight loss.  HENT: Negative for congestion.   Respiratory: Positive for cough.  Negative for hemoptysis, sputum production, shortness of breath and wheezing.   Cardiovascular: Negative for chest pain, palpitations and leg swelling.     Objective:   There were no vitals filed for this visit.    Physical Exam: No respiratory distress on phone  Data Reviewed:  Imaging: CT Chest 11/25/18 - Enlarged pulmonary artery trunk. No mediastinal adenopathy. Resolved RLL lung nodule. Unchanged right upper lobe lung nodules.  CT Chest 06/24/19 - Interval increase in tiny pulmonary nodules and opacities in the lower lungs bilaterally. Bronchiectasis present. Stable 81m RUL lung nodule. Calcified RLL nodule.  PFT: None on file  Micro: Sputum 07/10/19 - neg AFB 07/10/19 - neg Fungal 07/10/19 - Candida albicans - not clinically significant  Imaging, labs and  tests noted above have been reviewed independently by me.    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  Treated with antibiotics and steroids for abnormal CT chest concerning for atypical infection. Resolved respiratory symptoms except for occasional mucous production. No indication for PFTs as this time - ok to cancel.  Atypical pulmonary infection on CT Bronchiectasis ORDER CT chest without contrast CONTINUE albuterol inhaler and flutter valve twice a day If your symptoms are persistent, we will consider bronchoscopy for further evaluation however this is not indicated at this time.   Health Maintenance Immunization History  Administered Date(s) Administered  . Fluad Quad(high Dose 65+) 07/03/2019  . Influenza,inj,Quad PF,6+ Mos 07/19/2016, 07/24/2017, 07/29/2018  . Pneumococcal Conjugate-13 03/23/2014  Patient asked about COVID vaccination and pneumococcal vaccination. We discussed risks and benefits for choosing to vaccinate for COVID. I  strongly recommended being uptodate on her pneumococcal vaccinations for which she is due Pneumovax.  Orders Placed This Encounter  Procedures  . CT Chest Wo Contrast    Please schedule first available after December 8th    Standing Status:   Future    Standing Expiration Date:   11/16/2020    Order Specific Question:   ** REASON FOR EXAM (FREE TEXT)    Answer:   abnormal CT    Order Specific Question:   Preferred imaging location?    Answer:   [facility]    Order Specific Question:   Radiology Contrast Protocol - do NOT remove file path    Answer:   [email or web address]   No orders of the defined types were placed in this encounter.  Return in about 3 months (around 12/15/2019).  Glenn Dale, MD [facility] 09/16/2019 8:07 AM  Office Number [phone]

## 2019-09-16 NOTE — Patient Instructions (Addendum)
Atypical pulmonary infection on CT Bronchiectasis ORDER CT chest without contrast CONTINUE albuterol inhaler and flutter valve twice a day If your symptoms are persistent, we will consider bronchoscopy for further evaluation however this is not indicated at this time.   We discussed risks and benefits for choosing to vaccinate for COVID. I would advise you to consider COVID vaccination for your immunocompromised. I do strongly recommend being uptodate on her pneumococcal vaccinations.

## 2019-09-22 IMAGING — MR MR ABDOMEN WO/W CM
9 of 18 series · 21 of 48 positions shown · IV contrast (multihance)
Comparison: CT on 02/05/2018

CLINICAL DATA: Indeterminate right renal lesion on recent CT.

EXAM:
MRI ABDOMEN WITHOUT AND WITH CONTRAST
TECHNIQUE: Multiplanar multisequence MR imaging of the abdomen was performed
both before and after the administration of intravenous contrast.
CONTRAST:  17mL MULTIHANCE GADOBENATE DIMEGLUMINE 529 MG/ML IV SOLN

[Series 3: DWI b500 · axial · 6.0mm · 1.48mm/px · z∈[-86,+203]mm · 2 of 76 slices shown]
[im 1/76]
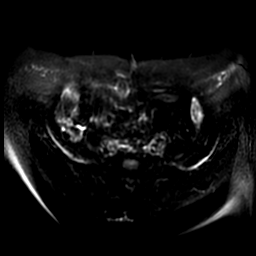
[im 76/76]
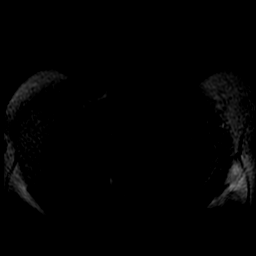

[Series 4: T2 fat-sat · axial · 5.0mm · 0.78mm/px · z∈[-86,+204]mm · 2 of 59 slices shown]
[im 1/59]
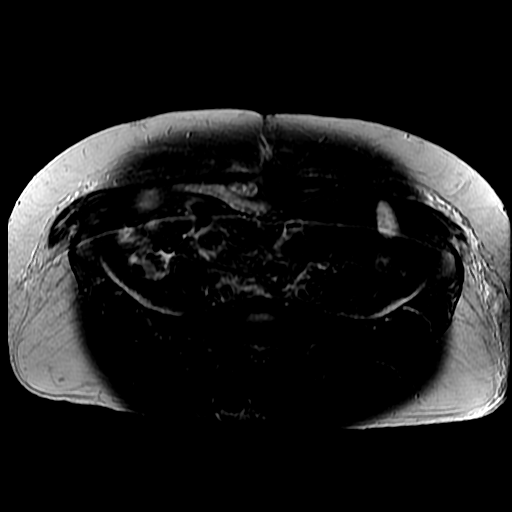
[im 59/59]
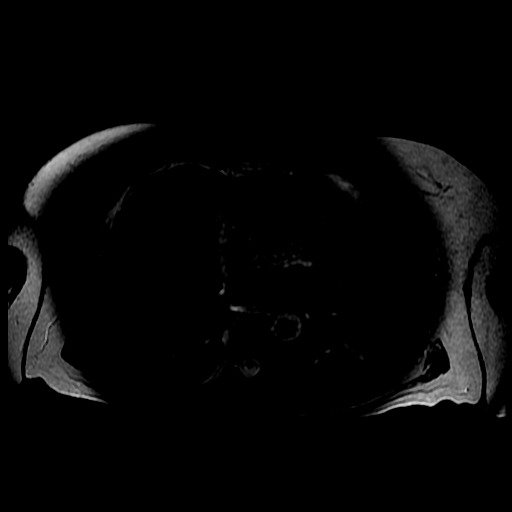

[Series 5: ax dualecho · axial · 5.0mm · 0.78mm/px · z∈[-44,+191]mm · 4 of 96 slices shown]
[im 1/96]
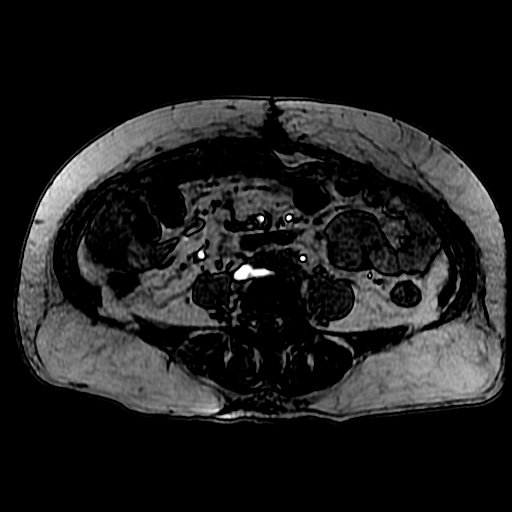
[im 32/96]
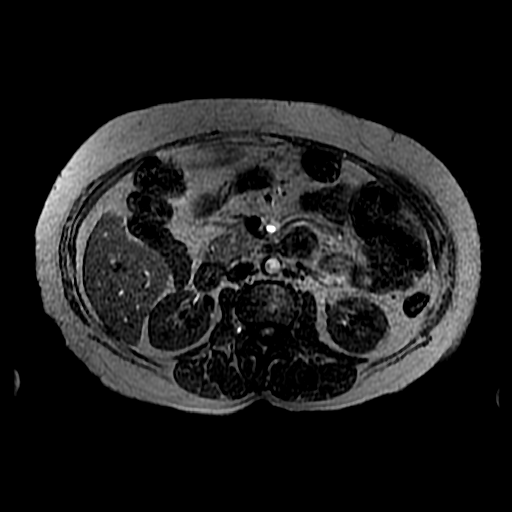
[im 64/96]
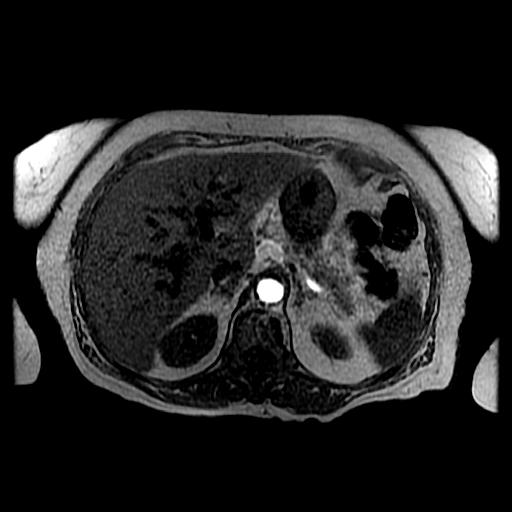
[im 96/96]
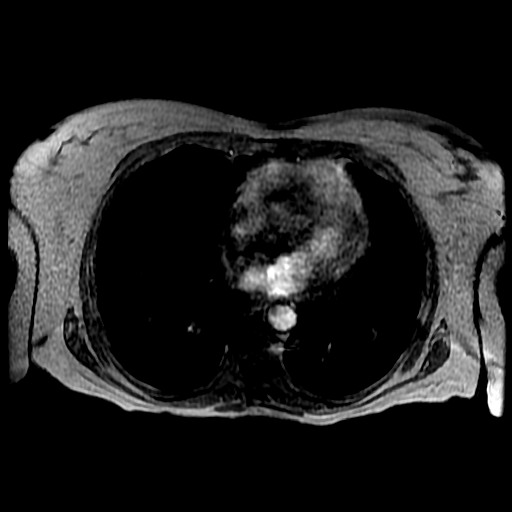

[Series 6: T2 · axial · 5.0mm · 0.78mm/px · z∈[-44,+191]mm · 2 of 48 slices shown (1 of 2)]
[im 1/48]
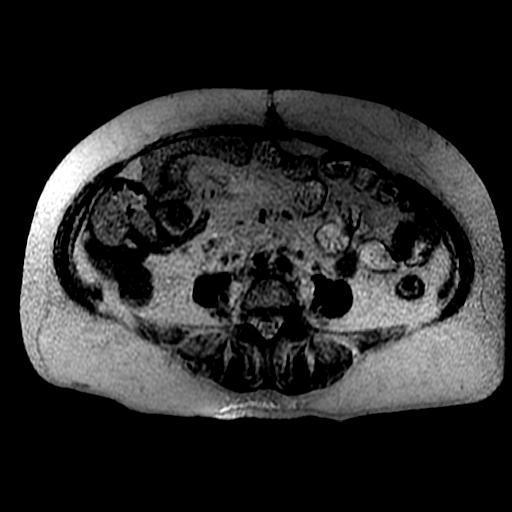
[im 48/48]
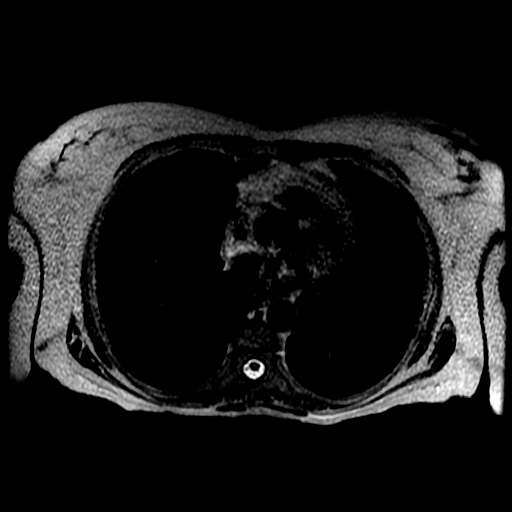

[Series 7: T2 · coronal · 5.0mm · 0.78mm/px · 2 of 45 slices shown (2 of 2)]
[im 1/45]
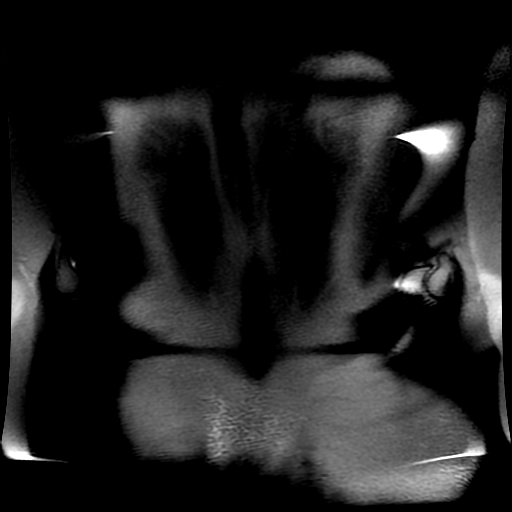
[im 45/45]
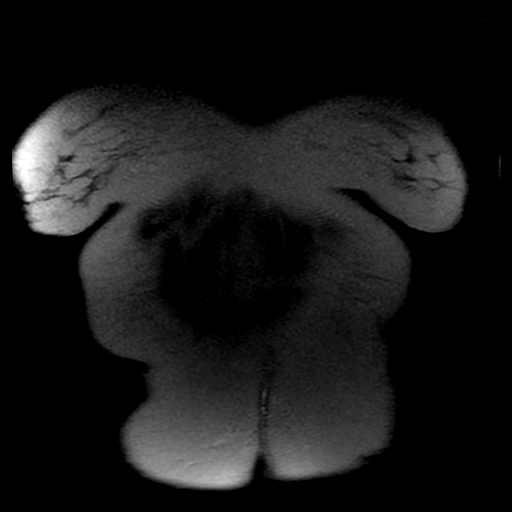

[Series 8: bSSFP · axial · 5.0mm · 0.78mm/px · z∈[-50,+185]mm · 2 of 48 slices shown]
[im 1/48]
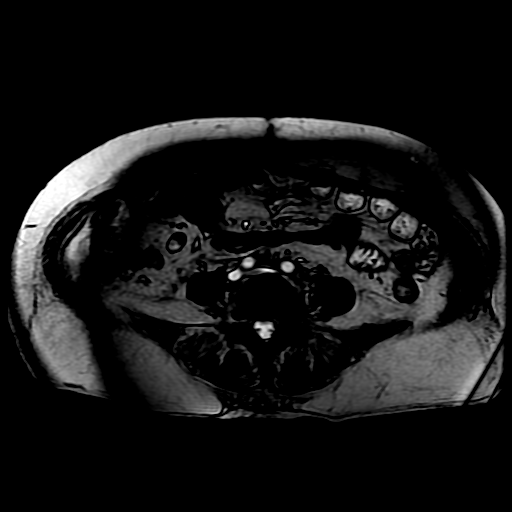
[im 48/48]
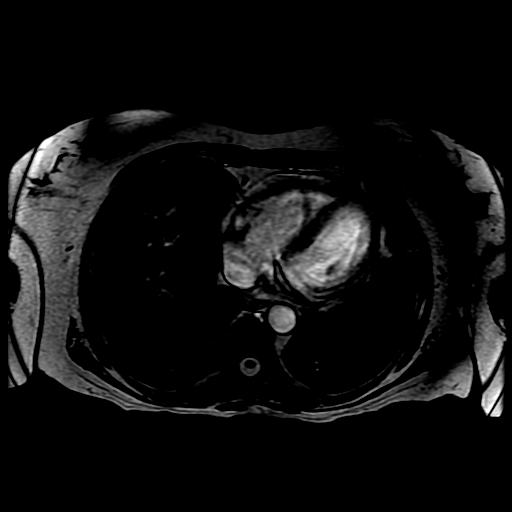

[Series 300: DWI · axial · 6.0mm · 1.48mm/px · 1 of 38 slices shown]
[im 1/38]
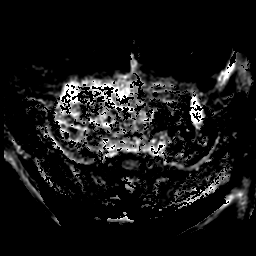

[Series 900: T1 dynamic · axial · 6.0mm · 0.78mm/px · z∈[-53,+184]mm · 3 of 80 slices shown (1 of 2)]
[im 1/80]
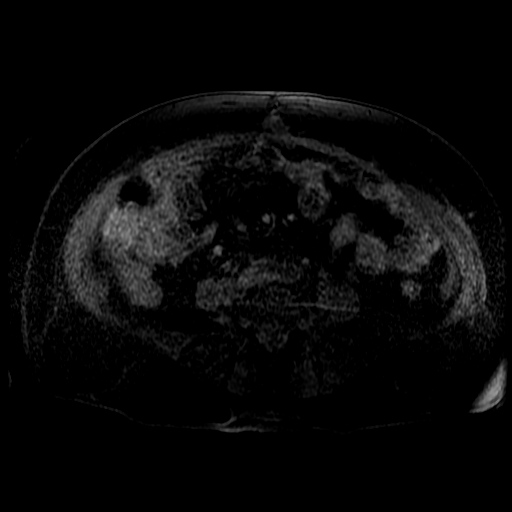
[im 40/80]
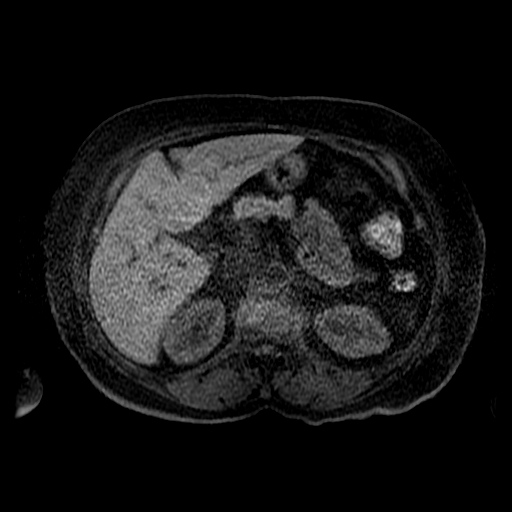
[im 80/80]
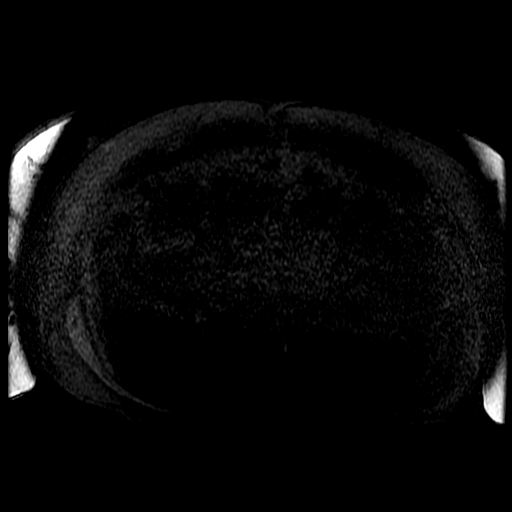

[Series 901: T1 dynamic · axial · 6.0mm · 0.78mm/px · z∈[-53,+184]mm · 3 of 80 slices shown (2 of 2)]
[im 1/80]
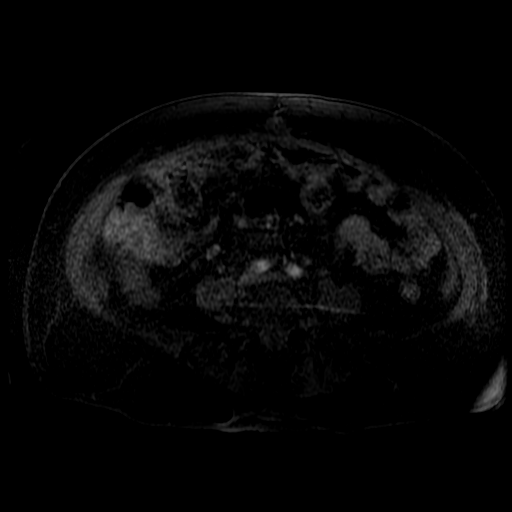
[im 40/80]
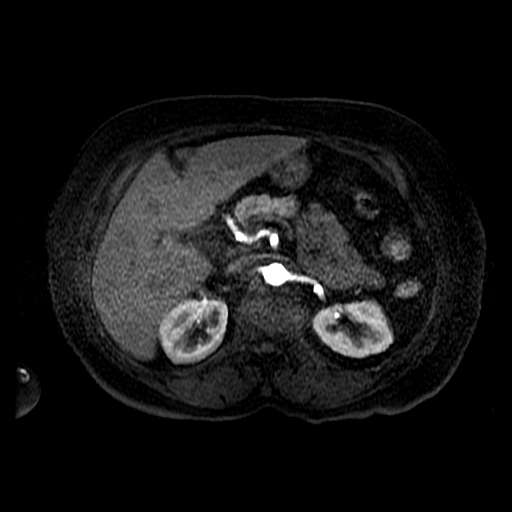
[im 80/80]
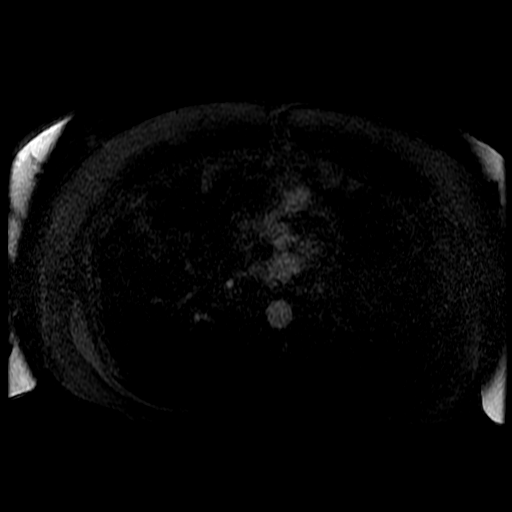

[21 of 48 positions shown; findings below may reference images not displayed]

FINDINGS: Lower chest: No acute findings.

Hepatobiliary: No hepatic masses identified. Prior cholecystectomy.
No evidence of biliary dilatation.

Pancreas:  No mass or inflammatory changes.

Spleen:  Within normal limits in size and appearance.

Adrenals/Urinary Tract: Normal appearance of both adrenal glands. A
1.6 cm subcapsular lesion is seen in the upper pole the right kidney
which shows T1 hyperintensity and lack of contrast enhancement or
other complex features. This is consistent with a benign Bosniak
category 2 cyst. No other cystic or solid lesions are seen involving
either kidney. No evidence of hydronephrosis.

Stomach/Bowel: Visualized portion unremarkable.

Vascular/Lymphatic: No pathologically enlarged lymph nodes
identified. No abdominal aortic aneurysm.

Other:  None.

Musculoskeletal:  No suspicious bone lesions identified.
IMPRESSION: Benign Bosniak category 2 cyst in upper pole of right kidney,
corresponding with lesion seen on previous CT. No evidence of renal
neoplasm or other significant abnormality.

## 2019-09-25 ENCOUNTER — Ambulatory Visit (HOSPITAL_COMMUNITY)
Admission: RE | Admit: 2019-09-25 | Discharge: 2019-09-25 | Disposition: A | Discharge status: Home / Self Care | Payer: Medicare HMO | Source: Ambulatory Visit | Attending: Pulmonary Disease | Admitting: Pulmonary Disease

## 2019-09-25 ENCOUNTER — Other Ambulatory Visit: Payer: Self-pay

## 2019-09-25 DIAGNOSIS — R9389 Abnormal findings on diagnostic imaging of other specified body structures: Secondary | ICD-10-CM | POA: Diagnosis not present

## 2019-09-26 ENCOUNTER — Ambulatory Visit (HOSPITAL_COMMUNITY): Payer: Medicare HMO

## 2019-10-01 ENCOUNTER — Telehealth: Payer: Self-pay | Admitting: Pulmonary Disease

## 2019-10-01 NOTE — Telephone Encounter (Signed)
Reviewed CT Chest. Inflammation in lungs resolved. Bronchoscopy not indicated. RUL lung nodule stable since 01/2018. Will arrange for follow-up in 6 months and consider repeat low-dose CT Chest to complete 2 year follow-up of nodule.  Rodman Pickle, M.D. [facility] 10/01/2019 5:51 PM

## 2019-10-13 ENCOUNTER — Telehealth: Payer: Self-pay | Admitting: Oncology

## 2019-10-13 MED FILL — RUBRACA 300 MG TAB: 300 | 30 days supply | Qty: 60 | Fill #8

## 2019-10-13 NOTE — Telephone Encounter (Signed)
[name] called and said her daughter and son in law have tested positive for Covid and she is wondering if she should still come in for her appointment tomorrow with [name].  She said she was with them on Thursday for dinner.  Her daughter started to feel sick on Saturday and is now in the hospital.    She is also wondering about having the rapid test through her PCP - if the results would be acceptable at the [facility].

## 2019-10-13 NOTE — Telephone Encounter (Signed)
I think we should cancel everything tomorrow until she gets tested or wait 21 days from exposure

## 2019-10-13 NOTE — Telephone Encounter (Signed)
Called [name] back and advised her of message from [name].  She verbalized understanding and will let us know when she has testing.

## 2019-10-14 ENCOUNTER — Inpatient Hospital Stay: Payer: Medicare HMO

## 2019-10-14 ENCOUNTER — Inpatient Hospital Stay: Payer: Medicare HMO | Admitting: Hematology and Oncology

## 2019-10-16 ENCOUNTER — Telehealth: Payer: Self-pay | Admitting: Oncology

## 2019-10-16 NOTE — Telephone Encounter (Signed)
Truthfully I do not know if she will benefit from the vaccine while on Rubraca We can potentially hold her treatment for 1 month Please reschedule her appt once we know she tested negative

## 2019-10-16 NOTE — Telephone Encounter (Signed)
Called [name] and advised her of message from [name].  She verbalized understanding and will call us back once she gets her test results.

## 2019-10-16 NOTE — Telephone Encounter (Signed)
[name] called and said she was tested for Covid yesterday and should have the results tomorrow.  She still does not have any symptoms.    She said the health department will be vaccinating phase 1b people next week.  She is wondering if she is ok to get the vaccine while taking Rubraca.

## 2019-10-24 ENCOUNTER — Telehealth: Payer: Self-pay | Admitting: Hematology and Oncology

## 2019-10-24 ENCOUNTER — Telehealth: Payer: Self-pay | Admitting: Oncology

## 2019-10-24 NOTE — Telephone Encounter (Signed)
[name] called and said she has tested negative for Covid and is out of quarantine today.  She is wondering if she can reschedule her appointments for flush, labs and follow up with [name].

## 2019-10-24 NOTE — Telephone Encounter (Signed)
Pls schedule for next week Thursday morning, thanks

## 2019-10-24 NOTE — Telephone Encounter (Signed)
Scheduled per 1/8 sch msg. Called and spoke with pt, confirmed 1/14 appt

## 2019-10-30 ENCOUNTER — Encounter: Payer: Self-pay | Admitting: Hematology and Oncology

## 2019-10-30 ENCOUNTER — Telehealth: Payer: Self-pay | Admitting: Oncology

## 2019-10-30 ENCOUNTER — Inpatient Hospital Stay: Payer: Medicare HMO | Attending: Gynecologic Oncology | Admitting: Hematology and Oncology

## 2019-10-30 ENCOUNTER — Inpatient Hospital Stay: Payer: Medicare HMO

## 2019-10-30 ENCOUNTER — Other Ambulatory Visit: Payer: Self-pay

## 2019-10-30 DIAGNOSIS — C562 Malignant neoplasm of left ovary: Secondary | ICD-10-CM | POA: Diagnosis present

## 2019-10-30 DIAGNOSIS — Z7984 Long term (current) use of oral hypoglycemic drugs: Secondary | ICD-10-CM | POA: Diagnosis not present

## 2019-10-30 DIAGNOSIS — R918 Other nonspecific abnormal finding of lung field: Secondary | ICD-10-CM

## 2019-10-30 DIAGNOSIS — Z452 Encounter for adjustment and management of vascular access device: Secondary | ICD-10-CM | POA: Diagnosis not present

## 2019-10-30 DIAGNOSIS — E039 Hypothyroidism, unspecified: Secondary | ICD-10-CM

## 2019-10-30 DIAGNOSIS — Z79899 Other long term (current) drug therapy: Secondary | ICD-10-CM | POA: Insufficient documentation

## 2019-10-30 DIAGNOSIS — Z9221 Personal history of antineoplastic chemotherapy: Secondary | ICD-10-CM | POA: Insufficient documentation

## 2019-10-30 DIAGNOSIS — Z299 Encounter for prophylactic measures, unspecified: Secondary | ICD-10-CM

## 2019-10-30 HISTORY — DX: Encounter for prophylactic measures, unspecified: Z29.9

## 2019-10-30 LAB — CBC WITH DIFFERENTIAL/PLATELET
Abs Immature Granulocytes: 0.05 10*3/uL (ref 0.00–0.07)
Basophils Absolute: 0.1 10*3/uL (ref 0.0–0.1)
Basophils Relative: 1 %
Eosinophils Absolute: 0.2 10*3/uL (ref 0.0–0.5)
Eosinophils Relative: 2 %
HCT: 36.8 % (ref 36.0–46.0)
Hemoglobin: 12.1 g/dL (ref 12.0–15.0)
Immature Granulocytes: 1 %
Lymphocytes Relative: 32 %
Lymphs Abs: 2.1 10*3/uL (ref 0.7–4.0)
MCH: 33.2 pg (ref 26.0–34.0)
MCHC: 32.9 g/dL (ref 30.0–36.0)
MCV: 100.8 fL — ABNORMAL HIGH (ref 80.0–100.0)
Monocytes Absolute: 0.4 10*3/uL (ref 0.1–1.0)
Monocytes Relative: 6 %
Neutro Abs: 3.8 10*3/uL (ref 1.7–7.7)
Neutrophils Relative %: 58 %
Platelets: 155 10*3/uL (ref 150–400)
RBC: 3.65 MIL/uL — ABNORMAL LOW (ref 3.87–5.11)
RDW: 14.1 % (ref 11.5–15.5)
WBC: 6.6 10*3/uL (ref 4.0–10.5)
nRBC: 0 % (ref 0.0–0.2)

## 2019-10-30 LAB — COMPREHENSIVE METABOLIC PANEL
ALT: 25 U/L (ref 0–44)
AST: 18 U/L (ref 15–41)
Albumin: 3.9 g/dL (ref 3.5–5.0)
Alkaline Phosphatase: 72 U/L (ref 38–126)
Anion gap: 10 (ref 5–15)
BUN: 22 mg/dL (ref 8–23)
CO2: 22 mmol/L (ref 22–32)
Calcium: 9 mg/dL (ref 8.9–10.3)
Chloride: 107 mmol/L (ref 98–111)
Creatinine, Ser: 0.78 mg/dL (ref 0.44–1.00)
GFR calc Af Amer: >60 mL/min (ref >60)
GFR calc non Af Amer: >60 mL/min (ref >60)
Glucose, Bld: 127 mg/dL — ABNORMAL HIGH (ref 70–99)
Potassium: 4.1 mmol/L (ref 3.5–5.1)
Sodium: 139 mmol/L (ref 135–145)
Total Bilirubin: 0.4 mg/dL (ref 0.3–1.2)
Total Protein: 6.5 g/dL (ref 6.5–8.1)

## 2019-10-30 MED ORDER — SODIUM CHLORIDE 0.9% FLUSH
10.0000 mL | Freq: Once | INTRAVENOUS | Status: AC
  Administered 2019-10-30: 08:00:00 10 mL
  Filled 2019-10-30: qty 10

## 2019-10-30 MED ORDER — HEPARIN SOD (PORK) LOCK FLUSH 100 UNIT/ML IV SOLN
250.0000 [IU] | Freq: Once | INTRAVENOUS | Status: AC
  Administered 2019-10-30: 500 [IU]
  Filled 2019-10-30: qty 5

## 2019-10-30 NOTE — Progress Notes (Signed)
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 I do not plan to repeat imaging study for surveillance unless she have signs or symptoms to suggest cancer recurrence She will continue to come back every other month for blood work and port flushes I plan to see her again in 6 months for further follow-up  Acquired hypothyroidism She has gained some weight and she is attributing that to lack of exercise Follow with her primary care doctor for medication adjustment and repeat thyroid function tests and I will defer to them for further management  Multiple lung nodules on CT Her most recent CT imaging showed resolution of previous noted abnormalities She will continue close follow-up with pulmonologist for surveillance imaging  Preventive measure She has questions related to Covid vaccine We discussed the limitation of data on cancer patient receiving chemotherapy In her situation, I do not see contraindication for her to proceed   No orders of the defined types were placed in this encounter.   All questions were answered. The patient knows to call the clinic with any problems, questions or concerns. The total time spent in the appointment was 20 minutes encounter with patients including review of chart and various tests results, discussions about plan of care and coordination of care plan   Heath Lark, MD 10/30/2019 8:57 AM  INTERVAL HISTORY: Please see below for problem oriented charting. 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 - TUMOR INVOLVES OVARY SURFACE AND LEFT FALLOPIAN TUBE. - SEE ONCOLOGY TABLE. 2. Mesentery, small bowel mesentery biopsy #1 - BENIGN FIBROADIPOSE TISSUE. 3. Ovary and fallopian tube, right - BENIGN OVARY WITH INCLUSION CYSTS. - BENIGN FALLOPIAN TUBE WITH PARATUBAL CYSTS AND ADENOFIBROMA. 4. Omentum, resection for tumor - BENIGN ADIPOSE TISSUE. 5. Peritoneum, biopsy, left diaphragmatic - BENIGN PERITONEAL TYPE TISSUE. 6. Peritoneum, biopsy, right diaphragmatic - BENIGN PERITONEAL TYPE TISSUE. 7. Mesentery, small bowel mesenteric biopsy #2 - BENIGN FIBROADIPOSE TISSUE. 8. Lymph nodes, regional resection, right pelvic - FIVE OF FIVE LYMPH NODES NEGATIVE FOR CARCINOMA (0/5). 9. Lymph nodes, regional resection, left pelvic - FOUR OF FOUR LYMPH NODES NEGATIVE FOR CARCINOMA (0/4). 10. Peritoneum, biopsy, left gutter - BENIGN PERITONEAL TYPE TISSUE. 11. Peritoneum, biopsy, bladder - BENIGN PERITONEAL TYPE TISSUE WITH ACUTE INFLAMMATION AND CALCIFICATIONS.  12. Peritoneum, biopsy, cul-de-sac - BENIGN PERITONEAL TYPE TISSUE WITH ACUTE INFLAMMATION AND CALCIFICATIONS. 13. Soft tissue, biopsy, right gutter - BENIGN FIBROADIPOSE TISSUE. 14.  Lymph node, biopsy, right para-aortic - TWO OF TWO LYMPH NODES NEGATIVE FOR CARCINOMA  (0/2). 15. Lymph node, biopsy, left para-aortic - ONE OF ONE LYMPH NODES NEGATIVE FOR CARCINOMA (0/1). Microscopic Comment 1. OVARY Specimen(s): Left ovary and fallopian tube. Procedure: (including lymph node sampling): Bilateral salpingo-oophorectomy with omental and peritoneal biopsies and lymph node biopsies. Primary tumor site (including laterality): Left ovary. Ovarian surface involvement: Present. Ovarian capsule intact without fragmentation: Intact. Maximum tumor size (cm): 11 cm total. Histologic type: Serous cystadenocarcinoma. Grade: High grade (low grade areas also present). Peritoneal implants: (specify invasive or non-invasive): N/A. Pelvic extension (list additional structures on separate lines and if involved): Left fallopian tube. Lymph nodes: number examined 12 ; number positive 0 TNM code: pT2a, pN0, pMX FIGO Stage (based on pathologic findings, needs clinical correlation): IIA Comments: None.   01/31/2018 Pathology Results   PERITONEAL WASHING (SPECIMEN 1 OF 1 COLLECTED 01/31/18): MALIGNANT CELLS PRESENT CONSISTENT WITH METASTATIC CARCINOMA.   01/31/2018 Surgery   Procedure(s) Performed:  1. Robotic BSO and washings. 2. Exploratory laparotomy, Staging including infragastric omentectomy, Bilateral pelvic and paraaortic lymphadenectomy, pertioneal biopsies.  Specimens: Bilateral tubes / ovaries, bilateral pelvic and paraaortic lymph nodes, peritoneal biopsies, washings and omentum.  Operative Findings: The left adnexa was adherent to the left pelvic peritoneum suspected due to the inferior retraction from the vaginal hysterectomy. Intraoperative leakage of cystic fluid from left ovary.  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  The right adnexa grossly appeared normal although slightly enlarged for her age.  There were some indurated lymph nodes but no overtly malignant lymph nodes were suspected.  Small bowel mesentery  had 3-4 superficial possible implants.  1 of these was sent for frozen section returned benign.  She did have adhesive disease from her open cholecystectomy in the right upper quadrant.  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 16 mm exophytic lesion upper pole right kidney has attenuation too high to be a simple cyst. This may be a cyst complicated by proteinaceous debris or hemorrhage, but renal cell carcinoma is a concern. Routine outpatient follow-up MRI of the abdomen without and with contrast recommended after resolution of patient's acute symptoms (so she is better able to participate with positioning and breath holding). 2. Mild edema/possible minimal hemorrhage in the extraperitoneal soft tissues of the pelvic for tracking up both pelvic sidewalls. Imaging appearance is not outside of the spectrum of findings expected 6 days after the reported surgery. Trace interloop mesenteric and free fluid is identified in the peritoneal cavity in there is some mild edema in the omentum. There is no organized or rim enhancing collection to suggest evolving abscess. 3. Gas in the extraperitoneal soft tissues of the left lower quadrant is associated with gas in the subcutaneous fat of the lower left anterior abdominal wall. This is not unexpected on postoperative day 6. No evidence for intraperitoneal free air. 4. No CT features to suggest small bowel obstruction. Oral contrast material has migrated about halfway through the small bowel loops but there is no differential distention of proximal versus  distal small  bowel. The colon is diffusely distended and fluid-filled proximally, but formed stool is noted in the distal colon. Given apparent slow migration of contrast and fluid-filled small and large bowel loops, a component of ileus is possible.   02/14/2018 Imaging   MR abdomen Benign Bosniak category 2 cyst in upper pole of right kidney, corresponding with lesion seen on previous CT. No evidence of renal neoplasm or other significant abnormality.    02/18/2018 Cancer Staging   Staging form: Ovary, Fallopian Tube, and Primary Peritoneal Carcinoma, AJCC 8th Edition - Pathologic: Stage II (pT2, pN0, cM0) - Signed by Heath Lark, MD on 02/18/2018   02/28/2018 Procedure   Status post right IJ port catheter placement. 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 For last cycle, taxol was omitted   07/18/2018 Imaging   1. No evidence for residual or recurrent tumor within the abdomen or pelvis. There has been interval resolution edema and fluid within the peritoneal cavity. 2. New pulmonary nodule is identified within the right lower lobe measuring 1.1 cm, image 87/4. Consider one of the following in 3 months for both low-risk and high-risk individuals: (a) repeat chest CT, (b) follow-up PET-CT, or (c) tissue sampling. This recommendation follows the consensus statement: Guidelines for Management of Incidental Pulmonary Nodules Detected on CT Images: From the Fleischner Society 2017; Radiology 2017; 284:228-243. 3. Stable 7 mm nodule within the right upper lobe. 4. Aortic Atherosclerosis (ICD10-I70.0). 5. Multi vessel coronary artery atherosclerotic calcifications.   07/18/2018 Tumor Marker   Patient's tumor was tested for the following markers: CA-125 Results of the  tumor marker test revealed 14.9   07/25/2018 PET scan   1. Regressing right lower lobe peripheral lung lesion, likely resolving inflammatory or atelectatic process. No hypermetabolism to suggest malignancy. I would recommend a follow-up noncontrast chest CT and 4-6 months. Small upper lobe pulmonary nodules are stable. 2. 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   07/31/2018 -  Chemotherapy   The patient started taking rucaparib   10/25/2018 Tumor Marker   Patient's tumor was tested for the following markers: CA-125 Results of the tumor marker test revealed 12.1   11/25/2018 Imaging   1. Right lower lobe nodule has resolved. Additional right lung nodules are stable. 2. Stable hazy nodularity along the left external iliac chain.  3. Hepatomegaly. 4. Aortic atherosclerosis (ICD10-170.0). Coronary artery calcification. 5. Enlarged pulmonic trunk, indicative of pulmonary arterial hypertension.   01/16/2019 Tumor Marker   Patient's tumor was tested for the following markers: CA-125 Results of the tumor marker test revealed 13.1   05/22/2019 Tumor Marker   Patient's tumor was tested for the following markers: CA-125 Results of the tumor marker test revealed 14.4   06/24/2019 Imaging   Ct chest, abdomen and pelvis 1. There are multiple clustered tiny centrilobular nodules and irregular opacities of the bilateral lower lobes and lingula, increased compared to prior examination, with underlying bandlike scarring of the medial right middle lobe and right lung base (e.g. series 4, image 100, 110, 123). 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  2. Stable 6 mm pulmonary nodule of the right upper lobe (series 4, image  45). Attention on follow-up.   3. Status post hysterectomy and oophorectomy. No evidence of recurrent or metastatic disease in the abdomen  or pelvis.   4. There are occasional sigmoid diverticula and focal fat stranding about a diverticulum of the mid sigmoid (series 2, image 103). Correlate for signs and symptoms of acute diverticulitis.   5. Other chronic, incidental, and postoperative findings as detailed above.   06/24/2019 Tumor Marker   Patient's tumor was tested for the following markers: CA-125 Results of the tumor marker test revealed 14.4.   08/19/2019 Tumor Marker   Patient's tumor was tested for the following markers: CA-125 Results of the tumor marker test revealed 10.3   09/25/2019 Imaging   CT chest 1. Interval resolution of previously demonstrated clustered centrilobular nodularity at both lung bases consistent with resolved inflammation. Stable 6 mm right upper lobe nodule consistent with a benign finding. No new or enlarging pulmonary nodules.  2. Stable chronic bronchiectasis and scarring in the right middle lobe and lingula. 3. No evidence of metastatic disease. 4. Aortic Atherosclerosis (ICD10-I70.0).     REVIEW OF SYSTEMS:   Constitutional: Denies fevers, chills or abnormal weight loss Eyes: Denies blurriness of vision Ears, nose, mouth, throat, and face: Denies mucositis or sore throat Respiratory: Denies cough, dyspnea or wheezes Cardiovascular: Denies palpitation, chest discomfort or lower extremity swelling Gastrointestinal:  Denies nausea, heartburn or change in bowel habits Skin: Denies abnormal skin rashes Lymphatics: Denies new lymphadenopathy or easy bruising Neurological:Denies numbness, tingling or new weaknesses Behavioral/Psych: Mood is stable, no new changes  All other systems were reviewed with the patient and are negative.  I have reviewed the past medical history, past surgical history, social history and family history with the patient and they are unchanged from previous note.  ALLERGIES:  is allergic to penicillins.  MEDICATIONS:  Current Outpatient Medications  Medication  Sig Dispense Refill  . atorvastatin (LIPITOR) 20 MG tablet Take 20 mg by mouth daily.    Marland Kitchen linaclotide (LINZESS) 145 MCG CAPS capsule Take 145 mcg by mouth daily before breakfast.    . albuterol (VENTOLIN HFA) 108 (90 Base) MCG/ACT inhaler Inhale 2 puffs into the lungs every 6 (six) hours as needed for wheezing or shortness of breath. 6.7 g 6  . Cyanocobalamin (B-12 COMPLIANCE INJECTION) 1000 MCG/ML KIT Inject 1,000 mcg as directed every 30 (thirty) days.    Marland Kitchen FLUoxetine (PROZAC) 20 MG capsule Take 30 mg by mouth daily after breakfast.     . hydrochlorothiazide (HYDRODIURIL) 25 MG tablet Take 25 mg by mouth daily.    Marland Kitchen HYDROcodone-acetaminophen (NORCO) 10-325 MG tablet Take 1 tablet by mouth every 6 (six) hours as needed. (Patient not taking: Reported on 07/03/2019) 60 tablet 0  . levothyroxine (SYNTHROID) 112 MCG tablet Take 1 tablet (112 mcg total) by mouth daily before breakfast.    . lidocaine-prilocaine (EMLA) cream Apply to affected area once 30 g 3  . losartan (COZAAR) 100 MG tablet Take 100 mg by mouth at bedtime.     . Melatonin 5 MG TABS Take 5-10 mg by mouth at bedtime as needed (sleep).     . metFORMIN (GLUCOPHAGE-XR) 500 MG 24 hr tablet Take 500 mg by mouth 2 (two) times daily.    . polycarbophil (FIBERCON) 625 MG tablet Take 625 mg by mouth daily.    . polyethylene glycol (MIRALAX / GLYCOLAX) 17 g packet Take 17 g by mouth daily.    Marland Kitchen Respiratory Therapy Supplies (FLUTTER) DEVI 10 times twice a  day and as needed 1 each 0  . rucaparib camsylate (RUBRACA) 300 MG tablet Take 1 tablet (300 mg total) by mouth 2 (two) times daily. 60 tablet 11   No current facility-administered medications for this visit.    PHYSICAL EXAMINATION: ECOG PERFORMANCE STATUS: 1 - Symptomatic but completely ambulatory  Vitals:   10/30/19 0813  BP: 120/65  Pulse: 72  Resp: 17  Temp: 98.9 F (37.2 C)  SpO2: 100%   Filed Weights   10/30/19 0813  Weight: 173 lb 9.6 oz (78.7 kg)    GENERAL:alert, no  distress and comfortable SKIN: skin color, texture, turgor are normal, no rashes or significant lesions EYES: normal, Conjunctiva are pink and non-injected, sclera clear OROPHARYNX:no exudate, no erythema and lips, buccal mucosa, and tongue normal  NECK: supple, thyroid normal size, non-tender, without nodularity LYMPH:  no palpable lymphadenopathy in the cervical, axillary or inguinal LUNGS: clear to auscultation and percussion with normal breathing effort HEART: regular rate & rhythm and no murmurs and no lower extremity edema ABDOMEN:abdomen soft, non-tender and normal bowel sounds Musculoskeletal:no cyanosis of digits and no clubbing  NEURO: alert & oriented x 3 with fluent speech, no focal motor/sensory deficits  LABORATORY DATA:  I have reviewed the data as listed    Component Value Date/Time   NA 139 10/30/2019 0800   K 4.1 10/30/2019 0800   CL 107 10/30/2019 0800   CO2 22 10/30/2019 0800   GLUCOSE 127 (H) 10/30/2019 0800   BUN 22 10/30/2019 0800   CREATININE 0.78 10/30/2019 0800   CREATININE 0.93 02/13/2019 1024   CALCIUM 9.0 10/30/2019 0800   PROT 6.5 10/30/2019 0800   ALBUMIN 3.9 10/30/2019 0800   AST 18 10/30/2019 0800   AST 26 02/13/2019 1024   ALT 25 10/30/2019 0800   ALT 23 02/13/2019 1024   ALKPHOS 72 10/30/2019 0800   BILITOT 0.4 10/30/2019 0800   BILITOT 0.8 02/13/2019 1024   GFRNONAA >60 10/30/2019 0800   GFRNONAA 59 (L) 02/13/2019 1024   GFRAA >60 10/30/2019 0800   GFRAA >60 02/13/2019 1024    No results found for: SPEP, UPEP  Lab Results  Component Value Date   WBC 6.6 10/30/2019   NEUTROABS 3.8 10/30/2019   HGB 12.1 10/30/2019   HCT 36.8 10/30/2019   MCV 100.8 (H) 10/30/2019   PLT 155 10/30/2019      Chemistry      Component Value Date/Time   NA 139 10/30/2019 0800   K 4.1 10/30/2019 0800   CL 107 10/30/2019 0800   CO2 22 10/30/2019 0800   BUN 22 10/30/2019 0800   CREATININE 0.78 10/30/2019 0800   CREATININE 0.93 02/13/2019 1024       Component Value Date/Time   CALCIUM 9.0 10/30/2019 0800   ALKPHOS 72 10/30/2019 0800   AST 18 10/30/2019 0800   AST 26 02/13/2019 1024   ALT 25 10/30/2019 0800   ALT 23 02/13/2019 1024   BILITOT 0.4 10/30/2019 0800   BILITOT 0.8 02/13/2019 1024

## 2019-10-30 NOTE — Assessment & Plan Note (Signed)
She has gained some weight and she is attributing that to lack of exercise Follow with her primary care doctor for medication adjustment and repeat thyroid function tests and I will defer to them for further management

## 2019-10-30 NOTE — Assessment & Plan Note (Signed)
Her most recent CT imaging showed resolution of previous noted abnormalities She will continue close follow-up with pulmonologist for surveillance imaging

## 2019-10-30 NOTE — Patient Instructions (Signed)

## 2019-10-30 NOTE — Assessment & Plan Note (Signed)
Clinically, she have no signs or symptoms to suggest cancer recurrence Tumor markers pending; we will call her tomorrow with test results Her last CT imaging show no evidence of cancer recurrence. I do not plan to repeat imaging study for surveillance unless she have signs or symptoms to suggest cancer recurrence She will continue to come back every other month for blood work and port flushes I plan to see her again in 6 months for further follow-up

## 2019-10-30 NOTE — Telephone Encounter (Signed)
[name] with appointment to see Dr. Delsa Sale on 01/21/20 at 9 am.  She verbalized understanding and agreement.

## 2019-10-30 NOTE — Assessment & Plan Note (Signed)
She has questions related to Covid vaccine We discussed the limitation of data on cancer patient receiving chemotherapy In her situation, I do not see contraindication for her to proceed

## 2019-10-31 ENCOUNTER — Telehealth: Payer: Self-pay | Admitting: Oncology

## 2019-10-31 LAB — CA 125: Cancer Antigen (CA) 125: 12.1 U/mL (ref 0.0–38.1)

## 2019-10-31 NOTE — Telephone Encounter (Signed)
Notified Marlin of normal CA 125 results per [name].

## 2019-11-03 ENCOUNTER — Telehealth: Payer: Self-pay | Admitting: Hematology and Oncology

## 2019-11-03 NOTE — Telephone Encounter (Signed)
Scheduled per 1/14 sch msg. Called and spoke with pt, confirmed 3/11 appt

## 2019-11-11 MED FILL — RUBRACA 300 MG TAB: 300 | 30 days supply | Qty: 60 | Fill #9

## 2019-12-12 ENCOUNTER — Other Ambulatory Visit: Payer: Self-pay

## 2019-12-12 ENCOUNTER — Telehealth: Payer: Self-pay

## 2019-12-15 NOTE — Telephone Encounter (Signed)
Oral Oncology Patient Advocate Encounter  Met patient in lobby to complete application for Rubraca in an effort to reduce patient's out of pocket expense for Rubraca to $0.    Application completed and faxed to [phone].   Rubraca patient assistance phone number for follow up is [phone].   This encounter will be updated until final determination.   [name] Phone [phone] Fax [phone] 12/15/2019 11:22 AM

## 2019-12-17 ENCOUNTER — Telehealth: Payer: Self-pay | Admitting: Oncology

## 2019-12-17 ENCOUNTER — Other Ambulatory Visit: Payer: Self-pay | Admitting: Hematology and Oncology

## 2019-12-17 DIAGNOSIS — E039 Hypothyroidism, unspecified: Secondary | ICD-10-CM

## 2019-12-17 NOTE — Telephone Encounter (Signed)
I ordered it with the understanding that Dr. Bea Graff will manage the results

## 2019-12-17 NOTE — Telephone Encounter (Signed)
[name] said she is having lab work drawn next week on 12/25/19 and is wondering if [name] can add on a TSH, T3 and T4.  She is feeling dizzy in the mornings, cold, tired and has some weight gain.  She does not see Dr. Bea Graff until May.  She also mentioned that she doesn't have a grant currently for [place].  She is working with Benjamine Mola in [place] to find another one.  She has enough medication until April.

## 2019-12-17 NOTE — Telephone Encounter (Signed)
Called M[place] and advised her of message from [name].  She verbalized understanding.

## 2019-12-19 ENCOUNTER — Telehealth: Payer: Self-pay | Admitting: Pulmonary Disease

## 2019-12-19 NOTE — Telephone Encounter (Signed)
Spoke with pt, she states she has a note on her calendar to call us in March. I advised her that she needed to schedule a 3 month f/u. Appt made 3/29 at 11:45am. Nothing further is needed.     Patient Instructions by Margaretha Seeds, MD at 09/16/2019 9:00 AM Author: Margaretha Seeds, MD Author Type: Physician Filed: 09/16/2019 9:43 AM  Note Status: Addendum Cosign: Cosign Not Required Encounter Date: 09/16/2019  Editor: Margaretha Seeds, MD (Physician)  Prior Versions: 1. Margaretha Seeds, MD (Physician) at 09/16/2019 9:42 AM - Addendum   2. 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   We discussed risks and benefits for choosing to vaccinate for COVID. I would advise you to consider COVID vaccination for your immunocompromised. I do strongly recommend being uptodate on her pneumococcal vaccinations.

## 2019-12-25 ENCOUNTER — Other Ambulatory Visit: Payer: Self-pay

## 2019-12-25 ENCOUNTER — Inpatient Hospital Stay: Payer: Medicare HMO | Attending: Gynecologic Oncology

## 2019-12-25 ENCOUNTER — Inpatient Hospital Stay: Payer: Medicare HMO

## 2019-12-25 DIAGNOSIS — C562 Malignant neoplasm of left ovary: Secondary | ICD-10-CM | POA: Insufficient documentation

## 2019-12-25 DIAGNOSIS — E039 Hypothyroidism, unspecified: Secondary | ICD-10-CM

## 2019-12-25 LAB — CBC WITH DIFFERENTIAL/PLATELET
Abs Immature Granulocytes: 0.03 10*3/uL (ref 0.00–0.07)
Basophils Absolute: 0 10*3/uL (ref 0.0–0.1)
Basophils Relative: 1 %
Eosinophils Absolute: 0.1 10*3/uL (ref 0.0–0.5)
Eosinophils Relative: 2 %
HCT: 35 % — ABNORMAL LOW (ref 36.0–46.0)
Hemoglobin: 11.7 g/dL — ABNORMAL LOW (ref 12.0–15.0)
Immature Granulocytes: 1 %
Lymphocytes Relative: 26 %
Lymphs Abs: 1.4 10*3/uL (ref 0.7–4.0)
MCH: 33.7 pg (ref 26.0–34.0)
MCHC: 33.4 g/dL (ref 30.0–36.0)
MCV: 100.9 fL — ABNORMAL HIGH (ref 80.0–100.0)
Monocytes Absolute: 0.4 10*3/uL (ref 0.1–1.0)
Monocytes Relative: 7 %
Neutro Abs: 3.5 10*3/uL (ref 1.7–7.7)
Neutrophils Relative %: 63 %
Platelets: 153 10*3/uL (ref 150–400)
RBC: 3.47 MIL/uL — ABNORMAL LOW (ref 3.87–5.11)
RDW: 14.2 % (ref 11.5–15.5)
WBC: 5.4 10*3/uL (ref 4.0–10.5)
nRBC: 0 % (ref 0.0–0.2)

## 2019-12-25 LAB — COMPREHENSIVE METABOLIC PANEL
ALT: 16 U/L (ref 0–44)
AST: 16 U/L (ref 15–41)
Albumin: 3.9 g/dL (ref 3.5–5.0)
Alkaline Phosphatase: 57 U/L (ref 38–126)
Anion gap: 9 (ref 5–15)
BUN: 18 mg/dL (ref 8–23)
CO2: 24 mmol/L (ref 22–32)
Calcium: 9.2 mg/dL (ref 8.9–10.3)
Chloride: 108 mmol/L (ref 98–111)
Creatinine, Ser: 0.8 mg/dL (ref 0.44–1.00)
GFR calc Af Amer: >60 mL/min (ref >60)
GFR calc non Af Amer: >60 mL/min (ref >60)
Glucose, Bld: 103 mg/dL — ABNORMAL HIGH (ref 70–99)
Potassium: 4.1 mmol/L (ref 3.5–5.1)
Sodium: 141 mmol/L (ref 135–145)
Total Bilirubin: 0.7 mg/dL (ref 0.3–1.2)
Total Protein: 6.3 g/dL — ABNORMAL LOW (ref 6.5–8.1)

## 2019-12-25 LAB — T4, FREE: Free T4: 0.98 ng/dL (ref 0.61–1.12)

## 2019-12-25 LAB — TSH: TSH: 0.505 u[IU]/mL (ref 0.308–3.960)

## 2019-12-26 LAB — T3: T3, Total: 111 ng/dL (ref 71–180)

## 2019-12-27 LAB — CA 125: Cancer Antigen (CA) 125: 14.8 U/mL (ref 0.0–38.1)

## 2019-12-29 ENCOUNTER — Telehealth: Payer: Self-pay | Admitting: Oncology

## 2019-12-29 NOTE — Telephone Encounter (Signed)
Called [name] and let her know that her CA 125 is stable and her thyroid tests are ok.  She verbalized understanding and said she has a sore area on the upper right inside of her cheek that started 6 weeks ago.  She said it is not open, is red, rough and is sensitive to hot, cold and spicy foods.  She has made an appointment with her dentist for tomorrow morning but is wondering if it could be a side effect of Rubraca.  She is also wondering if she should be using a mouthwash.  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  She does not like to take gabapentin because it makes her dizzy.  She did take a hydrocodone which helped but she only has 3-4 tablets left and is not sure if [name] wants her to keep taking it.  She uses [facility] if a refill can be sent in.

## 2019-12-29 NOTE — Telephone Encounter (Signed)
[name] with message from [name].  She said she has tried Lyrica in the past and had the same dizziness as with gabapentin.  She wants to hold off for now.  She will call back if she wants to set up the e-visit.

## 2019-12-29 NOTE — Telephone Encounter (Signed)
1) Mouth sore: unlikely due to Rubraca. I would wait and see what dentist say 2) I do not recommend hydrocodone long term to treat neuropathy. We can try cymbalta or lyrica if [name] wants to try something else.   If [name] still have many questions we can set up appt visit here to e-visit end of the week to address all her questions

## 2019-12-30 ENCOUNTER — Telehealth: Payer: Self-pay | Admitting: Oncology

## 2019-12-30 NOTE — Telephone Encounter (Signed)
Called [name] back and she is going to have the biopsy today.  She was able to be worked in by the Chief Financial Officer.  Advised her of message from [name]. She verbalized understanding and will let us know if she starts prednisone.

## 2019-12-30 NOTE — Telephone Encounter (Signed)
I am not an expert in looking at the mouth I do not want to waste her time coming here; if her dentist recommend biopsy or steroids, she needs to follow the recommendations. This is not due to her treatment

## 2019-12-30 NOTE — Telephone Encounter (Signed)
[name] went to the dentist this morning for the sore area on her right cheek.  He has referred her to an oral surgeon for a biopsy on 01/08/20.  She said the dentist said it may be autoimmune and wants to do a biopsy and she will most likely need steroids. She is wondering if [name] would be able to see her to look at the area.

## 2019-12-31 ENCOUNTER — Telehealth: Payer: Self-pay

## 2019-12-31 NOTE — Telephone Encounter (Signed)
Received vm from [facility] stating that pt will be getting a $4,000 grant from them.

## 2020-01-09 ENCOUNTER — Telehealth: Payer: Self-pay

## 2020-01-09 NOTE — Telephone Encounter (Signed)
Oral Oncology Patient Advocate Encounter   Was successful in securing patient an $3500 grant from Patient [facility]) to provide copayment coverage for Rubraca.  This will keep the out of pocket expense at $0.     I have spoken with the patient.    The billing information is as follows and has been shared with [facility].   Member ID: [id number] Group ID: [id number] RxBin: [id number] Dates of Eligibility: 11/08/19 through 11/06/20  Fund:  [place] [facility] [place] Phone [phone] Fax [phone] 01/09/2020 1:48 PM

## 2020-01-12 ENCOUNTER — Other Ambulatory Visit: Payer: Self-pay

## 2020-01-12 ENCOUNTER — Ambulatory Visit (INDEPENDENT_AMBULATORY_CARE_PROVIDER_SITE_OTHER): Payer: Medicare HMO | Admitting: Pulmonary Disease

## 2020-01-12 ENCOUNTER — Encounter: Payer: Self-pay | Admitting: Pulmonary Disease

## 2020-01-12 VITALS — BP 100/60 | HR 62 | Temp 97.8°F | Ht 65.0 in | Wt 179.2 lb

## 2020-01-12 DIAGNOSIS — R9389 Abnormal findings on diagnostic imaging of other specified body structures: Secondary | ICD-10-CM

## 2020-01-12 DIAGNOSIS — J479 Bronchiectasis, uncomplicated: Secondary | ICD-10-CM

## 2020-01-12 NOTE — Progress Notes (Signed)
Subjective:   PATIENT ID: [name] GENDER: female DOB: 07-Jun-1941, MRN: [id number]   HPI  Chief Complaint  Patient presents with  . Follow-up    horseness/cough.  symptoms resolved.  thick mucus, clear.      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  She previously had abnormal CT chest concerning for atypical infection in September. She was treated with antibiotics and had repeat CT completed in December. Since then, she rarely has cough or phelgm anymore. Denies any shortness of breath. No fevers, chills or unintentional weight loss.  I have personally reviewed patient's past medical/family/social history/allergies/current medications.  Past Medical History:  Diagnosis Date  . Arthritis   . Diabetes mellitus without complication ([place])    type 2   . GERD (gastroesophageal reflux disease)   . Hypertension   . Hypothyroidism   . Ovarian cancer ([place]) 01/31/2018  . Pneumonia    its been 4 years   . PONV (postoperative nausea and vomiting)   . Vitamin B 12 deficiency      Outpatient Medications Prior to Visit  Medication Sig Dispense Refill  . amLODipine (NORVASC) 5 MG tablet Take 5 mg by mouth daily.    Marland Kitchen atorvastatin (LIPITOR) 20 MG tablet Take 5 mg by mouth daily.     . Cyanocobalamin (B-12 COMPLIANCE INJECTION) 1000 MCG/ML KIT Inject 1,000 mcg as directed every 30 (thirty) days.    Marland Kitchen FLUoxetine (PROZAC) 20 MG capsule Take 30 mg by mouth daily after breakfast.     . hydrochlorothiazide (HYDRODIURIL) 25 MG tablet Take 25 mg by mouth daily.    Marland Kitchen HYDROcodone-acetaminophen (NORCO) 10-325 MG tablet Take 1 tablet by mouth every 6 (six) hours as needed. 60 tablet 0  . levothyroxine (SYNTHROID) 112 MCG tablet Take 1 tablet (112 mcg total) by mouth daily before breakfast.     . lidocaine-prilocaine (EMLA) cream Apply to affected area once 30 g 3  . linaclotide (LINZESS) 145 MCG CAPS capsule Take 145 mcg by mouth daily before breakfast.    . losartan (COZAAR) 100 MG tablet Take 100 mg by mouth at bedtime.     . Melatonin 5 MG TABS Take 5-10 mg by mouth at bedtime as needed (sleep).     . metFORMIN (GLUCOPHAGE-XR) 500 MG 24 hr tablet Take 1,000 mg by mouth 2 (two) times daily.     . polycarbophil (FIBERCON) 625 MG tablet Take 625 mg by mouth daily.    . polyethylene glycol (MIRALAX / GLYCOLAX) 17 g packet Take 17 g by mouth daily.    . rucaparib camsylate (RUBRACA) 300 MG tablet Take 1 tablet (300 mg total) by mouth 2 (two) times daily. 60 tablet 11  . albuterol (VENTOLIN HFA) 108 (90 Base) MCG/ACT inhaler Inhale 2 puffs into the lungs every 6 (six) hours as needed for wheezing or shortness of breath. (Patient not taking: Reported on 01/12/2020) 6.7 g 6  . Respiratory Therapy Supplies (FLUTTER) DEVI 10 times twice a day and as needed (Patient not taking: Reported on 01/12/2020) 1 each 0   No facility-administered medications prior to visit.    Review of Systems  Constitutional: Negative for chills, diaphoresis, fever, malaise/fatigue and weight loss.  HENT: Negative for congestion.   Respiratory: Negative for cough, hemoptysis, sputum production, shortness of breath and wheezing.   Cardiovascular: Negative  for chest pain, palpitations and leg swelling.   Objective:   Vitals:   01/12/20 1220  BP: 100/60  Pulse: 62  Temp: 97.8 F (36.6 C)  TempSrc: Temporal  SpO2: 97%  Weight: 179 lb 3.2 oz (81.3 kg)  Height: _0  (1.651 m)   SpO2: 97 % O2 Device: None (Room air)  Physical Exam: General: Elderly, frail female, no acute distress HENT: [place], AT Eyes: EOMI, no scleral icterus Respiratory: Clear to auscultation bilaterally.  No crackles, wheezing or rales Cardiovascular: RRR, -M/R/G, no JVD Extremities:-Edema,-tenderness Neuro: AAO x4, CNII-XII grossly  intact Skin: Intact, no rashes or bruising Psych: Normal mood, normal affect  Data Reviewed:  Imaging: CT Chest 11/25/18 - Enlarged pulmonary artery trunk. No mediastinal adenopathy. Resolved RLL lung nodule. Unchanged right upper lobe lung nodules.  CT Chest 06/24/19 - Interval increase in tiny pulmonary nodules and opacities in the lower lungs bilaterally. Bronchiectasis present. Stable 37m RUL lung nodule. Calcified RLL nodule.  CT Chest 09/25/19 - Unchanged bronchiectasis. Interval resolution of nodularities. RLL granuloma and non-calcified 659mRUL nodule that is unchanged.  PFT: None on file  Micro: Sputum 07/10/19 - neg AFB 07/10/19 - neg Fungal 07/10/19 - Candida albicans - not clinically significant  Imaging, labs and tests noted above have been reviewed independently by me.    Assessment & Plan:   Discussion: [age]ear old female with stage II ovarian s/p surgery and chemo without recurrence who presents for follow-up. Reviewed CT together with resolved inflammation in lung bases. I addressed her questions and concerns. We discussed that her inhaler can be used as needed for symptoms. Flutter valve not indicated.  Bronchiectasis CONTINUE albuterol as needed  Health Maintenance Immunization History  Administered Date(s) Administered  . Fluad Quad(high Dose 65+) 07/03/2019  . Influenza,inj,Quad PF,6+ Mos 07/19/2016, 07/24/2017, 07/29/2018  . PFIZER SARS-COV-2 Vaccination 11/02/2019, 11/22/2019  . Pneumococcal Conjugate-13 03/23/2014  . Pneumococcal Polysaccharide-23 07/29/2007   No orders of the defined types were placed in this encounter.  No orders of the defined types were placed in this encounter.  Return in about 1 year (around 01/11/2021) for with me.  I have spent a total time of 31-minutes on the day of the appointment reviewing prior documentation, coordinating care and discussing medical diagnosis and plan with the patient/family. Imaging, labs and tests included  in this note have been reviewed and interpreted independently by me.  ChGolovinMD [facility] 01/12/2020 12:46 PM  Office Number [phone]

## 2020-01-15 MED FILL — RUBRACA 300 MG TAB: 300 | 30 days supply | Qty: 60 | Fill #10

## 2020-01-20 NOTE — Assessment & Plan Note (Addendum)
Negative symptom review, normal exam. Normal CA-125 levels Defer further imaging unless recurrence is suspected Return in 6 mths

## 2020-01-20 NOTE — Progress Notes (Signed)
Follow Up Note: Gyn-Onc  [name] 79 y.o. 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 - TUMOR INVOLVES OVARY SURFACE AND LEFT FALLOPIAN TUBE. - SEE ONCOLOGY TABLE. 2. Mesentery, small bowel mesentery biopsy #1 - BENIGN FIBROADIPOSE TISSUE. 3. Ovary and fallopian tube, right - BENIGN OVARY WITH INCLUSION CYSTS. - BENIGN FALLOPIAN TUBE WITH PARATUBAL CYSTS AND ADENOFIBROMA. 4. Omentum, resection for tumor - BENIGN ADIPOSE TISSUE. 5. Peritoneum, biopsy, left diaphragmatic - BENIGN PERITONEAL TYPE TISSUE. 6. Peritoneum, biopsy, right diaphragmatic - BENIGN PERITONEAL TYPE TISSUE. 7. Mesentery, small bowel mesenteric biopsy #2 - BENIGN FIBROADIPOSE TISSUE. 8. Lymph nodes, regional resection, right pelvic - FIVE OF FIVE LYMPH NODES NEGATIVE FOR CARCINOMA (0/5). 9. Lymph nodes, regional resection, left pelvic - FOUR OF FOUR LYMPH NODES NEGATIVE FOR CARCINOMA (0/4). 10. Peritoneum, biopsy, left gutter - BENIGN PERITONEAL TYPE TISSUE. 11. Peritoneum, biopsy, bladder - BENIGN PERITONEAL TYPE TISSUE WITH ACUTE INFLAMMATION AND CALCIFICATIONS.  12. Peritoneum, biopsy, cul-de-sac - BENIGN PERITONEAL TYPE TISSUE WITH ACUTE INFLAMMATION AND CALCIFICATIONS. 13. Soft tissue, biopsy, right gutter - BENIGN FIBROADIPOSE TISSUE. 14. Lymph node, biopsy, right para-aortic - TWO OF TWO LYMPH NODES NEGATIVE FOR CARCINOMA (0/2). 15. Lymph node, biopsy, left  para-aortic - ONE OF ONE LYMPH NODES NEGATIVE FOR CARCINOMA (0/1). Microscopic Comment 1. OVARY Specimen(s): Left ovary and fallopian tube. Procedure: (including lymph node sampling): Bilateral salpingo-oophorectomy with omental and peritoneal biopsies and lymph node biopsies. Primary tumor site (including laterality): Left ovary. Ovarian surface involvement: Present. Ovarian capsule intact without fragmentation: Intact. Maximum tumor size (cm): 11 cm total. Histologic type: Serous cystadenocarcinoma. Grade: High grade (low grade areas also present). Peritoneal implants: (specify invasive or non-invasive): N/A. Pelvic extension (list additional structures on separate lines and if involved): Left fallopian tube. Lymph nodes: number examined 12 ; number positive 0 TNM code: pT2a, pN0, pMX FIGO Stage (based on pathologic findings, needs clinical correlation): IIA Comments: None.   01/31/2018 Pathology Results   PERITONEAL WASHING (SPECIMEN 1 OF 1 COLLECTED 01/31/18): MALIGNANT CELLS PRESENT CONSISTENT WITH METASTATIC CARCINOMA.   01/31/2018 Surgery   Procedure(s) Performed:  1. Robotic BSO and washings. 2. Exploratory laparotomy, Staging including infragastric omentectomy, Bilateral pelvic and paraaortic lymphadenectomy, pertioneal biopsies.  Specimens: Bilateral tubes / ovaries, bilateral pelvic and paraaortic lymph nodes, peritoneal biopsies, washings and omentum.  Operative Findings: The left adnexa was adherent to the left pelvic peritoneum suspected due to the inferior retraction from the vaginal hysterectomy. Intraoperative leakage of cystic fluid from left ovary.  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  The right adnexa grossly appeared normal although slightly enlarged for her age.  There were some indurated lymph nodes but no overtly malignant lymph nodes were suspected.  Small bowel mesentery had 3-4 superficial possible implants.   1 of these was sent for frozen section returned benign.  She did have adhesive disease from her open cholecystectomy in the right upper quadrant.  No other evidence of disease in the abdomen or pelvis on palpation;   specifically including the small bowel stomach and large bowel    01/31/2018 Genetic Testing   Patient has genetic testing done for BRCA mutation on tumor sample Results revealed patient has no mutation   01/31/2018 Genetic Testing   Patient has genetic testing done and results revealed patient has the following mutation: RAD51C   02/05/2018 Imaging   CT scan of abdomen 1. 16 mm exophytic lesion upper pole right kidney has attenuation too high to be a simple cyst. This may be a cyst complicated by proteinaceous debris or hemorrhage, but renal cell carcinoma is a concern. Routine outpatient follow-up MRI of the abdomen without and with contrast recommended after resolution of patient's acute symptoms (so she is better able to participate with positioning and breath holding). 2. Mild edema/possible minimal hemorrhage in the extraperitoneal soft tissues of the pelvic for tracking up both pelvic sidewalls. Imaging appearance is not outside of the spectrum of findings expected 6 days after the reported surgery. Trace interloop mesenteric and free fluid is identified in the peritoneal cavity in there is some mild edema in the omentum. There is no organized or rim enhancing collection to suggest evolving abscess. 3. Gas in the extraperitoneal soft tissues of the left lower quadrant is associated with gas in the subcutaneous fat of the lower left anterior abdominal wall. This is not unexpected on postoperative day 6. No evidence for intraperitoneal free air. 4. No CT features to suggest small bowel obstruction. Oral contrast material has migrated about halfway through the small bowel loops but there is no differential distention of proximal versus distal small bowel. The colon is diffusely distended  and fluid-filled proximally, but formed stool is noted in the distal colon. Given apparent slow migration of contrast and fluid-filled small and large bowel loops, a component of ileus is possible.   02/14/2018 Imaging   MR abdomen Benign Bosniak category 2 cyst in upper pole of right kidney, corresponding with lesion seen on previous CT. No evidence of renal neoplasm or other significant abnormality.    02/18/2018 Cancer Staging   Staging form: Ovary, Fallopian Tube, and Primary Peritoneal Carcinoma, AJCC 8th Edition - Pathologic: Stage II (pT2, pN0, cM0) - Signed by Gorsuch, Ni, MD on 02/18/2018   02/28/2018 Procedure   Status post right IJ port catheter placement. 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 For last cycle, taxol was omitted   07/18/2018 Imaging   1. No evidence for residual or recurrent tumor within the abdomen or pelvis. There has been interval resolution edema and fluid within the peritoneal cavity. 2. New pulmonary nodule is identified within the right lower lobe measuring 1.1 cm, image 87/4. Consider one of the following in 3 months for both low-risk and high-risk individuals: (a) repeat chest CT, (b) follow-up PET-CT, or (c) tissue sampling. This recommendation follows the consensus statement: Guidelines for Management of Incidental Pulmonary Nodules Detected on CT Images: From the Fleischner Society 2017; Radiology 2017; 284:228-243. 3. Stable 7 mm nodule within the right upper lobe. 4. Aortic Atherosclerosis (ICD10-I70.0). 5. Multi vessel coronary artery atherosclerotic calcifications.   07/18/2018 Tumor Marker   Patient's tumor was tested for the following markers: CA-125 Results of the tumor marker test revealed 14.9    07/25/2018 PET scan     1. Regressing right lower lobe peripheral lung lesion, likely resolving inflammatory or atelectatic process. No hypermetabolism to suggest malignancy. I would recommend a follow-up noncontrast chest CT and 4-6 months. Small upper lobe pulmonary nodules are stable. 2. 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   07/31/2018 -  Chemotherapy   The patient started taking rucaparib   10/25/2018 Tumor Marker   Patient's tumor was tested for the following markers: CA-125 Results of the tumor marker test revealed 12.1   11/25/2018 Imaging   1. Right lower lobe nodule has resolved. Additional right lung nodules are stable. 2. Stable hazy nodularity along the left external iliac chain.  3. Hepatomegaly. 4. Aortic atherosclerosis (ICD10-170.0). Coronary artery calcification. 5. Enlarged pulmonic trunk, indicative of pulmonary arterial hypertension.   01/16/2019 Tumor Marker   Patient's tumor was tested for the following markers: CA-125 Results of the tumor marker test revealed 13.1   05/22/2019 Tumor Marker   Patient's tumor was tested for the following markers: CA-125 Results of the tumor marker test revealed 14.4   06/24/2019 Imaging   Ct chest, abdomen and pelvis 1. There are multiple clustered tiny centrilobular nodules and irregular opacities of the bilateral lower lobes and lingula, increased compared to prior examination, with underlying bandlike scarring of the medial right middle lobe and right lung base (e.g. series 4, image 100, 110, 123). 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  2. Stable 6 mm pulmonary nodule of the right upper lobe (series 4, image 45). Attention on follow-up.   3. Status post hysterectomy and oophorectomy. No evidence of recurrent or metastatic disease in the abdomen or pelvis.   4. There are  occasional sigmoid diverticula and focal fat stranding about a diverticulum of the mid sigmoid (series 2, image 103). Correlate for signs and symptoms of acute diverticulitis.   5. Other chronic, incidental, and postoperative findings as detailed above.   06/24/2019 Tumor Marker   Patient's tumor was tested for the following markers: CA-125 Results of the tumor marker test revealed 14.4.   08/19/2019 Tumor Marker   Patient's tumor was tested for the following markers: CA-125 Results of the tumor marker test revealed 10.3   09/25/2019 Imaging   CT chest 1. Interval resolution of previously demonstrated clustered centrilobular nodularity at both lung bases consistent with resolved inflammation. Stable 6 mm right upper lobe nodule consistent with a benign finding. No new or enlarging pulmonary nodules.  2. Stable chronic bronchiectasis and scarring in the right middle lobe and lingula. 3. No evidence of metastatic disease. 4. Aortic Atherosclerosis (ICD10-I70.0).   10/30/2019 Tumor Marker   Patient's tumor was tested for the following markers: CA-125 Results of the tumor marker test revealed 12.1   12/26/2019 Tumor Marker   Patient's tumor was tested for the following markers: CA-125 Results of the tumor marker test revealed 14.8.    Interval History:  No abdominal pain, increasing girth, weight loss of change in bowel habits.  No change in her chronic constipation; she is dependent on daily laxatives.  She was recently found to have an oral lesion that was biopsied.  The histology was benign.  She also experienced some lethargy.  Further evaluation showed worsening hypothyroidism.  This was felt to be secondary to her biotin supplement.  Her symptoms abated with replacement therapy and after discontinuing the biotin.  Review of Systems  Review of Systems  Constitutional: Negative for malaise/fatigue and weight   loss.  Gastrointestinal: Positive for constipation. Negative for abdominal pain,  diarrhea, nausea and vomiting.  Genitourinary: Negative for dysuria, frequency and urgency.    Current Meds:  Outpatient Encounter Medications as of 01/21/2020  Medication Sig  . albuterol (VENTOLIN HFA) 108 (90 Base) MCG/ACT inhaler Inhale 2 puffs into the lungs every 6 (six) hours as needed for wheezing or shortness of breath. (Patient not taking: Reported on 01/12/2020)  . amLODipine (NORVASC) 5 MG tablet Take 5 mg by mouth daily.  Marland Kitchen atorvastatin (LIPITOR) 20 MG tablet Take 5 mg by mouth daily.   . Cyanocobalamin (B-12 COMPLIANCE INJECTION) 1000 MCG/ML KIT Inject 1,000 mcg as directed every 30 (thirty) days.  Marland Kitchen FLUoxetine (PROZAC) 20 MG capsule Take 30 mg by mouth daily after breakfast.   . hydrochlorothiazide (HYDRODIURIL) 25 MG tablet Take 25 mg by mouth daily.  Marland Kitchen HYDROcodone-acetaminophen (NORCO) 10-325 MG tablet Take 1 tablet by mouth every 6 (six) hours as needed.  Marland Kitchen levothyroxine (SYNTHROID) 112 MCG tablet Take 1 tablet (112 mcg total) by mouth daily before breakfast.  . lidocaine-prilocaine (EMLA) cream Apply to affected area once  . linaclotide (LINZESS) 145 MCG CAPS capsule Take 145 mcg by mouth daily before breakfast.  . losartan (COZAAR) 100 MG tablet Take 100 mg by mouth at bedtime.   . Melatonin 5 MG TABS Take 5-10 mg by mouth at bedtime as needed (sleep).   . metFORMIN (GLUCOPHAGE-XR) 500 MG 24 hr tablet Take 1,000 mg by mouth 2 (two) times daily.   . polycarbophil (FIBERCON) 625 MG tablet Take 625 mg by mouth daily.  . polyethylene glycol (MIRALAX / GLYCOLAX) 17 g packet Take 17 g by mouth daily.  Marland Kitchen Respiratory Therapy Supplies (FLUTTER) DEVI 10 times twice a day and as needed (Patient not taking: Reported on 01/12/2020)  . rucaparib camsylate (RUBRACA) 300 MG tablet Take 1 tablet (300 mg total) by mouth 2 (two) times daily.   No facility-administered encounter medications on file as of 01/21/2020.    Allergy:  Allergies  Allergen Reactions  . Penicillins Anaphylaxis     Has patient had a PCN reaction causing immediate rash, facial/tongue/throat swelling, SOB or lightheadedness with hypotension: Yes Has patient had a PCN reaction causing severe rash involving mucus membranes or skin necrosis: No Has patient had a PCN reaction that required hospitalization: Yes Has patient had a PCN reaction occurring within the last 10 years: No If all of the above answers are "NO", then may proceed with Cephalosporin use.     Social Hx:   Social History   Socioeconomic History  . Marital status: Widowed    Spouse name: Not on file  . Number of children: 2  . Years of education: Not on file  . Highest education level: Not on file  Occupational History  . Occupation: retired Glass blower/designer  Tobacco Use  . Smoking status: Never Smoker  . Smokeless tobacco: Never Used  Substance and Sexual Activity  . Alcohol use: No  . Drug use: No  . Sexual activity: Not on file  Other Topics Concern  . Not on file  Social History Narrative  . Not on file   Social Determinants of Health   Financial Resource Strain:   . Difficulty of Paying Living Expenses:   Food Insecurity:   . Worried About Charity fundraiser in the Last Year:   . Arboriculturist in the Last Year:   Transportation Needs:   . Film/video editor (Medical):   Marland Kitchen  Lack of Transportation (Non-Medical):   Physical Activity:   . Days of Exercise per Week:   . Minutes of Exercise per Session:   Stress:   . Feeling of Stress :   Social Connections:   . Frequency of Communication with Friends and Family:   . Frequency of Social Gatherings with Friends and Family:   . Attends Religious Services:   . Active Member of Clubs or Organizations:   . Attends Archivist Meetings:   Marland Kitchen Marital Status:   Intimate Partner Violence:   . Fear of Current or Ex-Partner:   . Emotionally Abused:   Marland Kitchen Physically Abused:   . Sexually Abused:     Past Surgical Hx:  Past Surgical History:  Procedure  Laterality Date  . BLADDER SUSPENSION  2002  . BREAST SURGERY     several benign cysts removed; surgeries from [place]   . CHOLECYSTECTOMY  1988  . EYE SURGERY  2010 amd 2012   cataracts removed bilateral   . IR FLUORO GUIDE PORT INSERTION RIGHT  02/28/2018  . IR US GUIDE VASC ACCESS RIGHT  02/28/2018  . KNEE ARTHROSCOPY Left    l knee x2  . PARATHYROIDECTOMY  2010  . ROBOTIC ASSISTED SALPINGO OOPHERECTOMY Bilateral 01/31/2018   Procedure: XI ROBOTIC ASSISTED BILATERAL SALPINGO OOPHORECTOMY, STAGING, LAPAROTOMY, PELVIC AND PARA AORTIC LYMPH NODE DISSECTION, OMENTECTOMY;  Surgeon: Isabel Caprice, MD;  Location: [facility];  Service: Gynecology;  Laterality: Bilateral;  . ROTATOR CUFF REPAIR  2005   right   . 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 Arthritis   . Diabetes mellitus without complication ([place])    type 2   . GERD (gastroesophageal reflux disease)   . Hypertension   . Hypothyroidism   . Ovarian cancer ([place]) 01/31/2018  . Pneumonia    its been 4 years   . PONV (postoperative nausea and vomiting)   . Vitamin B 12 deficiency     Family Hx:  Family History  Problem Relation Age of Onset  . Lymphoma Mother 41  . Bladder Cancer Sister 36  . Prostate cancer Brother 60       has surgery right away after dx  . Leukemia Brother        28's  . Breast cancer Other 13       niece, GT 2017 reportedly neg  . Prostate cancer Father 66       found on autopsy at death (53)  . Lung cancer Maternal Aunt   . Esophageal cancer Maternal Uncle   . Other Maternal Grandfather 75       cerebral hemmhorage  . Lung cancer Cousin   . Lung cancer Cousin   . Leukemia Cousin     Vitals:  BP (!) 123/56 (BP Location: Left Arm, Patient Position: Sitting)   Pulse  64   Temp 98.5 F (36.9 C) (Temporal)   Resp 16   Ht 5' 4" (1.626 m)   Wt 178 lb 6 oz (80.9 kg)   SpO2 100%   BMI 30.62 kg/m   Physical Exam:  Physical Exam  Constitutional: She is oriented to person, place, and time. She appears distressed.  Abdominal: Soft. She exhibits no distension and no mass. There is  no abdominal tenderness.  Genitourinary:    Vulva, vagina and rectum normal.     Genitourinary Comments: No palpable masses.   Lymphadenopathy:       Right cervical: No superficial cervical adenopathy present.      Left cervical: No superficial cervical adenopathy present.       Right: No inguinal adenopathy present.       Left: No inguinal adenopathy present.  Neurological: She is alert and oriented to person, place, and time.  Skin: Skin is warm.  Psychiatric: Affect normal.    Assessment/Plan:  Malignant neoplasm of left ovary (HCC) Negative symptom review, normal exam. Normal CA-125 levels Defer further imaging unless recurrence is suspected Return in 6 mths   I personally spent 25 minutes face-to-face and non-face-to-face in the care of this patient, which includes all pre, intra, and post visit time on the date of service.    Lahoma Crocker, MD 01/20/2020, 10:02 AM

## 2020-01-21 ENCOUNTER — Other Ambulatory Visit: Payer: Self-pay

## 2020-01-21 ENCOUNTER — Inpatient Hospital Stay: Payer: Medicare HMO | Attending: Gynecologic Oncology | Admitting: Obstetrics & Gynecology

## 2020-01-21 ENCOUNTER — Encounter: Payer: Self-pay | Admitting: Obstetrics & Gynecology

## 2020-01-21 VITALS — BP 123/56 | HR 64 | Temp 98.5°F | Resp 16 | Ht 64.0 in | Wt 178.4 lb

## 2020-01-21 DIAGNOSIS — K219 Gastro-esophageal reflux disease without esophagitis: Secondary | ICD-10-CM | POA: Diagnosis not present

## 2020-01-21 DIAGNOSIS — E039 Hypothyroidism, unspecified: Secondary | ICD-10-CM | POA: Diagnosis not present

## 2020-01-21 DIAGNOSIS — Z8543 Personal history of malignant neoplasm of ovary: Secondary | ICD-10-CM | POA: Diagnosis present

## 2020-01-21 DIAGNOSIS — Z08 Encounter for follow-up examination after completed treatment for malignant neoplasm: Secondary | ICD-10-CM | POA: Diagnosis present

## 2020-01-21 DIAGNOSIS — E119 Type 2 diabetes mellitus without complications: Secondary | ICD-10-CM | POA: Diagnosis not present

## 2020-01-21 DIAGNOSIS — Z79899 Other long term (current) drug therapy: Secondary | ICD-10-CM | POA: Diagnosis not present

## 2020-01-21 DIAGNOSIS — E538 Deficiency of other specified B group vitamins: Secondary | ICD-10-CM | POA: Insufficient documentation

## 2020-01-21 DIAGNOSIS — C562 Malignant neoplasm of left ovary: Secondary | ICD-10-CM

## 2020-01-21 DIAGNOSIS — Z7984 Long term (current) use of oral hypoglycemic drugs: Secondary | ICD-10-CM | POA: Insufficient documentation

## 2020-01-21 DIAGNOSIS — Z90722 Acquired absence of ovaries, bilateral: Secondary | ICD-10-CM | POA: Diagnosis not present

## 2020-01-21 DIAGNOSIS — I10 Essential (primary) hypertension: Secondary | ICD-10-CM | POA: Diagnosis not present

## 2020-01-21 DIAGNOSIS — K5909 Other constipation: Secondary | ICD-10-CM | POA: Insufficient documentation

## 2020-01-21 DIAGNOSIS — Z9221 Personal history of antineoplastic chemotherapy: Secondary | ICD-10-CM | POA: Diagnosis not present

## 2020-01-21 DIAGNOSIS — Z923 Personal history of irradiation: Secondary | ICD-10-CM

## 2020-01-21 DIAGNOSIS — R5383 Other fatigue: Secondary | ICD-10-CM | POA: Insufficient documentation

## 2020-01-21 DIAGNOSIS — Z90710 Acquired absence of both cervix and uterus: Secondary | ICD-10-CM | POA: Insufficient documentation

## 2020-01-21 NOTE — Patient Instructions (Signed)
Return in October.

## 2020-02-04 ENCOUNTER — Other Ambulatory Visit: Payer: Self-pay | Admitting: Hematology and Oncology

## 2020-02-04 DIAGNOSIS — C562 Malignant neoplasm of left ovary: Secondary | ICD-10-CM

## 2020-02-05 ENCOUNTER — Other Ambulatory Visit: Payer: Self-pay | Admitting: Hematology and Oncology

## 2020-02-11 MED FILL — RUBRACA 300 MG TAB: 300 | 30 days supply | Qty: 60 | Fill #0

## 2020-02-16 ENCOUNTER — Telehealth: Payer: Self-pay | Admitting: Oncology

## 2020-02-16 DIAGNOSIS — L438 Other lichen planus: Secondary | ICD-10-CM

## 2020-02-16 NOTE — Telephone Encounter (Signed)
[name] called and confirmed her appointments for lab and port flush on 02/19/20.  She said that her mouth sore was biopsied and was a form of dermatitis.  She is going to start taking prednisone for it but wants to wait for her lab results first.  She also said she thinks the mouth sores are from Crestor because they started around the same time she started taking it.

## 2020-02-19 ENCOUNTER — Other Ambulatory Visit: Payer: Self-pay

## 2020-02-19 ENCOUNTER — Inpatient Hospital Stay: Payer: Medicare HMO | Attending: Gynecologic Oncology

## 2020-02-19 ENCOUNTER — Inpatient Hospital Stay: Payer: Medicare HMO

## 2020-02-19 DIAGNOSIS — C562 Malignant neoplasm of left ovary: Secondary | ICD-10-CM

## 2020-02-19 DIAGNOSIS — Z8543 Personal history of malignant neoplasm of ovary: Secondary | ICD-10-CM | POA: Diagnosis present

## 2020-02-19 LAB — CBC WITH DIFFERENTIAL/PLATELET
Abs Immature Granulocytes: 0.04 10*3/uL (ref 0.00–0.07)
Basophils Absolute: 0.1 10*3/uL (ref 0.0–0.1)
Basophils Relative: 1 %
Eosinophils Absolute: 0.2 10*3/uL (ref 0.0–0.5)
Eosinophils Relative: 2 %
HCT: 35.7 % — ABNORMAL LOW (ref 36.0–46.0)
Hemoglobin: 11.8 g/dL — ABNORMAL LOW (ref 12.0–15.0)
Immature Granulocytes: 1 %
Lymphocytes Relative: 35 %
Lymphs Abs: 2.3 10*3/uL (ref 0.7–4.0)
MCH: 33.4 pg (ref 26.0–34.0)
MCHC: 33.1 g/dL (ref 30.0–36.0)
MCV: 101.1 fL — ABNORMAL HIGH (ref 80.0–100.0)
Monocytes Absolute: 0.4 10*3/uL (ref 0.1–1.0)
Monocytes Relative: 6 %
Neutro Abs: 3.7 10*3/uL (ref 1.7–7.7)
Neutrophils Relative %: 55 %
Platelets: 160 10*3/uL (ref 150–400)
RBC: 3.53 MIL/uL — ABNORMAL LOW (ref 3.87–5.11)
RDW: 15.1 % (ref 11.5–15.5)
WBC: 6.6 10*3/uL (ref 4.0–10.5)
nRBC: 0 % (ref 0.0–0.2)

## 2020-02-19 LAB — COMPREHENSIVE METABOLIC PANEL
ALT: 29 U/L (ref 0–44)
AST: 34 U/L (ref 15–41)
Albumin: 4 g/dL (ref 3.5–5.0)
Alkaline Phosphatase: 63 U/L (ref 38–126)
Anion gap: 11 (ref 5–15)
BUN: 23 mg/dL (ref 8–23)
CO2: 22 mmol/L (ref 22–32)
Calcium: 9.9 mg/dL (ref 8.9–10.3)
Chloride: 107 mmol/L (ref 98–111)
Creatinine, Ser: 0.94 mg/dL (ref 0.44–1.00)
GFR calc Af Amer: >60 mL/min (ref >60)
GFR calc non Af Amer: 58 mL/min — ABNORMAL LOW (ref >60)
Glucose, Bld: 105 mg/dL — ABNORMAL HIGH (ref 70–99)
Potassium: 4.3 mmol/L (ref 3.5–5.1)
Sodium: 140 mmol/L (ref 135–145)
Total Bilirubin: 0.7 mg/dL (ref 0.3–1.2)
Total Protein: 6.8 g/dL (ref 6.5–8.1)

## 2020-02-19 MED ORDER — SODIUM CHLORIDE 0.9% FLUSH
10.0000 mL | Freq: Once | INTRAVENOUS | Status: AC
  Administered 2020-02-19: 10 mL
  Filled 2020-02-19: qty 10

## 2020-02-19 MED ORDER — HEPARIN SOD (PORK) LOCK FLUSH 100 UNIT/ML IV SOLN
500.0000 [IU] | Freq: Once | INTRAVENOUS | Status: AC
  Administered 2020-02-19: 500 [IU]
  Filled 2020-02-19: qty 5

## 2020-02-20 ENCOUNTER — Telehealth: Payer: Self-pay

## 2020-02-20 LAB — CA 125: Cancer Antigen (CA) 125: 15.5 U/mL (ref 0.0–38.1)

## 2020-02-20 NOTE — Telephone Encounter (Signed)
Called and given below message. She verbalized understanding. 

## 2020-02-20 NOTE — Telephone Encounter (Signed)
-----   Message from Heath Lark, MD sent at 02/20/2020  7:32 AM EDT ----- Regarding: labs are stable, pls call and let her know

## 2020-03-04 ENCOUNTER — Telehealth: Payer: Self-pay

## 2020-03-04 NOTE — Telephone Encounter (Signed)
Oral Oncology Patient Advocate Encounter   Was successful in securing patient a $4000 grant from [facility] [place] (PAF) to provide copayment coverage for Rubraca.  This will keep the out of pocket expense at $0.     I have spoken with the patient.    The billing information is as follows and has been shared with [place]: [id number] PCN:  [id number] Member ID: [id number] Group ID: [id number] Dates of Eligibility: 03/04/20 through 03/04/21  [place] [facility] [place] Phone [phone] Fax [phone] 03/04/2020 1:44 PM

## 2020-03-08 MED FILL — RUBRACA 300 MG TAB: 300 | 30 days supply | Qty: 60 | Fill #1

## 2020-03-29 ENCOUNTER — Telehealth: Payer: Self-pay

## 2020-03-29 NOTE — Telephone Encounter (Signed)
Oral Oncology Patient Advocate Encounter  Was successful in securing patient a $3500 grant from [employer] to provide copayment coverage for Rubraca.  This will keep the out of pocket expense at $0.     [facility] ID: [id number]  I have spoken with the patient.   The billing information is as follows and has been shared with [place].    [employer]: [id number] PCN: [id number] Member ID: [id number] Group ID: [id number] Dates of Eligibility: 02/28/20 through 02/26/21  Fund:  [place] Patient [place] Phone [phone] Fax [phone] 03/29/2020 11:40 AM

## 2020-04-07 MED FILL — RUBRACA 300 MG TAB: 300 | 30 days supply | Qty: 60 | Fill #2

## 2020-04-22 ENCOUNTER — Telehealth: Payer: Self-pay | Admitting: Hematology and Oncology

## 2020-04-22 ENCOUNTER — Encounter: Payer: Self-pay | Admitting: Hematology and Oncology

## 2020-04-22 ENCOUNTER — Inpatient Hospital Stay: Payer: Medicare HMO | Attending: Gynecologic Oncology | Admitting: Hematology and Oncology

## 2020-04-22 ENCOUNTER — Inpatient Hospital Stay: Payer: Medicare HMO

## 2020-04-22 ENCOUNTER — Other Ambulatory Visit: Payer: Self-pay

## 2020-04-22 DIAGNOSIS — L280 Lichen simplex chronicus: Secondary | ICD-10-CM

## 2020-04-22 DIAGNOSIS — D6481 Anemia due to antineoplastic chemotherapy: Secondary | ICD-10-CM | POA: Diagnosis not present

## 2020-04-22 DIAGNOSIS — C562 Malignant neoplasm of left ovary: Secondary | ICD-10-CM | POA: Diagnosis present

## 2020-04-22 DIAGNOSIS — T451X5A Adverse effect of antineoplastic and immunosuppressive drugs, initial encounter: Secondary | ICD-10-CM

## 2020-04-22 DIAGNOSIS — R634 Abnormal weight loss: Secondary | ICD-10-CM

## 2020-04-22 HISTORY — DX: Lichen simplex chronicus: L28.0

## 2020-04-22 HISTORY — DX: Abnormal weight loss: R63.4

## 2020-04-22 LAB — CBC WITH DIFFERENTIAL/PLATELET
Abs Immature Granulocytes: 0.03 10*3/uL (ref 0.00–0.07)
Basophils Absolute: 0 10*3/uL (ref 0.0–0.1)
Basophils Relative: 1 %
Eosinophils Absolute: 0.1 10*3/uL (ref 0.0–0.5)
Eosinophils Relative: 2 %
HCT: 32.9 % — ABNORMAL LOW (ref 36.0–46.0)
Hemoglobin: 10.9 g/dL — ABNORMAL LOW (ref 12.0–15.0)
Immature Granulocytes: 1 %
Lymphocytes Relative: 30 %
Lymphs Abs: 1.8 10*3/uL (ref 0.7–4.0)
MCH: 34.2 pg — ABNORMAL HIGH (ref 26.0–34.0)
MCHC: 33.1 g/dL (ref 30.0–36.0)
MCV: 103.1 fL — ABNORMAL HIGH (ref 80.0–100.0)
Monocytes Absolute: 0.4 10*3/uL (ref 0.1–1.0)
Monocytes Relative: 8 %
Neutro Abs: 3.5 10*3/uL (ref 1.7–7.7)
Neutrophils Relative %: 58 %
Platelets: 191 10*3/uL (ref 150–400)
RBC: 3.19 MIL/uL — ABNORMAL LOW (ref 3.87–5.11)
RDW: 15.2 % (ref 11.5–15.5)
WBC: 5.9 10*3/uL (ref 4.0–10.5)
nRBC: 0 % (ref 0.0–0.2)

## 2020-04-22 LAB — COMPREHENSIVE METABOLIC PANEL
ALT: 24 U/L (ref 0–44)
AST: 27 U/L (ref 15–41)
Albumin: 3.8 g/dL (ref 3.5–5.0)
Alkaline Phosphatase: 62 U/L (ref 38–126)
Anion gap: 10 (ref 5–15)
BUN: 26 mg/dL — ABNORMAL HIGH (ref 8–23)
CO2: 22 mmol/L (ref 22–32)
Calcium: 9.5 mg/dL (ref 8.9–10.3)
Chloride: 107 mmol/L (ref 98–111)
Creatinine, Ser: 1.03 mg/dL — ABNORMAL HIGH (ref 0.44–1.00)
GFR calc Af Amer: 60 mL/min — ABNORMAL LOW (ref >60)
GFR calc non Af Amer: 52 mL/min — ABNORMAL LOW (ref >60)
Glucose, Bld: 97 mg/dL (ref 70–99)
Potassium: 4.5 mmol/L (ref 3.5–5.1)
Sodium: 139 mmol/L (ref 135–145)
Total Bilirubin: 0.8 mg/dL (ref 0.3–1.2)
Total Protein: 6.4 g/dL — ABNORMAL LOW (ref 6.5–8.1)

## 2020-04-22 MED ORDER — HEPARIN SOD (PORK) LOCK FLUSH 100 UNIT/ML IV SOLN
500.0000 [IU] | Freq: Once | INTRAVENOUS | Status: AC
  Administered 2020-04-22: 500 [IU]
  Filled 2020-04-22: qty 5

## 2020-04-22 MED ORDER — SODIUM CHLORIDE 0.9% FLUSH
10.0000 mL | Freq: Once | INTRAVENOUS | Status: AC
  Administered 2020-04-22: 10 mL
  Filled 2020-04-22: qty 10

## 2020-04-22 NOTE — Telephone Encounter (Signed)
Scheduled appts per 7/8 sch msg. Gave pt a print out of AVS.

## 2020-04-22 NOTE — Assessment & Plan Note (Signed)
She has recent intentional weight loss through dietary changes She feels healthy Observe for now She will continue close TSH monitoring through her primary care doctor

## 2020-04-22 NOTE — Assessment & Plan Note (Signed)

## 2020-04-22 NOTE — Assessment & Plan Note (Signed)
I do not believe this is related to her chemotherapy I would defer to her dermatologist for management

## 2020-04-22 NOTE — Assessment & Plan Note (Signed)
Clinically, she have no signs or symptoms to suggest cancer recurrence Tumor markers pending; we will call her tomorrow with test results Her last CT imaging show no evidence of cancer recurrence. I do not plan to repeat imaging study for surveillance unless she have signs or symptoms to suggest cancer recurrence She will continue to come back every other month for blood work and port flushes I plan to see her again in 4 months for further follow-up

## 2020-04-22 NOTE — Progress Notes (Signed)
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 I do not plan to repeat imaging study for surveillance unless she have signs or symptoms to suggest cancer recurrence She will continue to come back every other month for blood work and port flushes I plan to see her again in 4 months for further follow-up  Anemia due to antineoplastic chemotherapy This is likely due to recent treatment. The patient denies recent history of bleeding such as epistaxis, hematuria or hematochezia. She is asymptomatic from the anemia. I will observe for now.  She does not require transfusion now. I will continue the chemotherapy at current dose without dosage adjustment.  If the anemia gets progressive worse in the future, I might have to delay her treatment or adjust the chemotherapy dose.   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   All questions were answered. The patient knows to call the clinic with any problems, questions or concerns. The total time spent in the appointment was 25 minutes encounter with patients including review of chart and various tests results, discussions about plan of care and coordination of care plan   Heath Lark, MD 04/22/2020 10:41 AM  INTERVAL HISTORY: Please see below for problem oriented charting. She returns for  further follow-up Over the past few months, she had recurrent skin itching and oral sores She was seen by dermatologist, oral surgeon and ENT Subsequent biopsy showed lichenoid dermatitis She was prescribed steroids which resolved her oral sore and itching She has intentional weight loss through dietary changes She is eating more plant-based diet She fell twice in the last few months but most incidence was related to changes in position She did not injure herself From the fallopian tube/ovarian cancer standpoint, she have no abdominal bloating, abdominal pain or changes in bowel habits  SUMMARY OF ONCOLOGIC HISTORY: Oncology History Overview Note  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 - TUMOR INVOLVES OVARY SURFACE AND LEFT FALLOPIAN TUBE. - SEE ONCOLOGY TABLE. 2. Mesentery, small bowel mesentery biopsy #1 - BENIGN FIBROADIPOSE TISSUE. 3. Ovary and fallopian tube, right - BENIGN OVARY WITH INCLUSION CYSTS. - BENIGN FALLOPIAN TUBE WITH PARATUBAL CYSTS AND ADENOFIBROMA. 4. Omentum, resection for tumor - BENIGN ADIPOSE TISSUE. 5. Peritoneum, biopsy, left diaphragmatic - BENIGN PERITONEAL TYPE TISSUE. 6. Peritoneum, biopsy, right diaphragmatic - BENIGN PERITONEAL TYPE TISSUE. 7. Mesentery, small bowel mesenteric biopsy #2 - BENIGN FIBROADIPOSE TISSUE. 8. Lymph nodes, regional resection, right pelvic - FIVE OF FIVE LYMPH NODES NEGATIVE FOR CARCINOMA (0/5). 9. Lymph nodes, regional resection, left pelvic -  FOUR OF FOUR LYMPH NODES NEGATIVE FOR CARCINOMA (0/4). 10. Peritoneum, biopsy, left gutter - BENIGN PERITONEAL TYPE TISSUE. 11. Peritoneum, biopsy, bladder -  BENIGN PERITONEAL TYPE TISSUE WITH ACUTE INFLAMMATION AND CALCIFICATIONS.  12. Peritoneum, biopsy, cul-de-sac - BENIGN PERITONEAL TYPE TISSUE WITH ACUTE INFLAMMATION AND CALCIFICATIONS. 13. Soft tissue, biopsy, right gutter - BENIGN FIBROADIPOSE TISSUE. 14. Lymph node, biopsy, right para-aortic - TWO OF TWO LYMPH NODES NEGATIVE FOR CARCINOMA (0/2). 15. Lymph node, biopsy, left para-aortic - ONE OF ONE LYMPH NODES NEGATIVE FOR CARCINOMA (0/1). Microscopic Comment 1. OVARY Specimen(s): Left ovary and fallopian tube. Procedure: (including lymph node sampling): Bilateral salpingo-oophorectomy with omental and peritoneal biopsies and lymph node biopsies. Primary tumor site (including laterality): Left ovary. Ovarian surface involvement: Present. Ovarian capsule intact without fragmentation: Intact. Maximum tumor size (cm): 11 cm total. Histologic type: Serous cystadenocarcinoma. Grade: High grade (low grade areas also present). Peritoneal implants: (specify invasive or non-invasive): N/A. Pelvic extension (list additional structures on separate lines and if involved): Left fallopian tube. Lymph nodes: number examined 12 ; number positive 0 TNM code: pT2a, pN0, pMX FIGO Stage (based on pathologic findings, needs clinical correlation): IIA Comments: None.   01/31/2018 Pathology Results   PERITONEAL WASHING (SPECIMEN 1 OF 1 COLLECTED 01/31/18): MALIGNANT CELLS PRESENT CONSISTENT WITH METASTATIC CARCINOMA.   01/31/2018 Surgery   Procedure(s) Performed:  1. Robotic BSO and washings. 2. Exploratory laparotomy, Staging including infragastric omentectomy, Bilateral pelvic and paraaortic lymphadenectomy, pertioneal biopsies.  Specimens: Bilateral tubes / ovaries, bilateral pelvic and paraaortic lymph nodes, peritoneal biopsies, washings and omentum.  Operative Findings: The left adnexa was adherent to the left pelvic peritoneum suspected due to the inferior retraction from the vaginal  hysterectomy. Intraoperative leakage of cystic fluid from left ovary.  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  The right adnexa grossly appeared normal although slightly enlarged for her age.  There were some indurated lymph nodes but no overtly malignant lymph nodes were suspected.  Small bowel mesentery had 3-4 superficial possible implants.  1 of these was sent for frozen section returned benign.  She did have adhesive disease from her open cholecystectomy in the right upper quadrant.  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 16 mm exophytic lesion upper pole right kidney has attenuation too high to be a simple cyst. This may be a cyst complicated by proteinaceous debris or hemorrhage, but renal cell carcinoma is a concern. Routine outpatient follow-up MRI of the abdomen without and with contrast recommended after resolution of patient's acute symptoms (so she is better able to participate with positioning and breath holding). 2. Mild edema/possible minimal hemorrhage in the extraperitoneal soft tissues of the pelvic for tracking up both pelvic sidewalls. Imaging appearance is not outside of the spectrum of findings expected 6 days after the reported surgery. Trace interloop mesenteric and free fluid is identified in the peritoneal cavity in there is some mild edema in the omentum. There is no organized or rim enhancing collection to suggest evolving abscess. 3. Gas in the extraperitoneal soft tissues of the left lower quadrant  is associated with gas in the  subcutaneous fat of the lower left anterior abdominal wall. This is not unexpected on postoperative day 6. No evidence for intraperitoneal free air. 4. No CT features to suggest small bowel obstruction. Oral contrast material has migrated about halfway through the small bowel loops but there is no differential distention of proximal versus distal small bowel. The colon is diffusely distended and fluid-filled proximally, but formed stool is noted in the distal colon. Given apparent slow migration of contrast and fluid-filled small and large bowel loops, a component of ileus is possible.   02/14/2018 Imaging   MR abdomen Benign Bosniak category 2 cyst in upper pole of right kidney, corresponding with lesion seen on previous CT. No evidence of renal neoplasm or other significant abnormality.    02/18/2018 Cancer Staging   Staging form: Ovary, Fallopian Tube, and Primary Peritoneal Carcinoma, AJCC 8th Edition - Pathologic: Stage II (pT2, pN0, cM0) - Signed by Heath Lark, MD on 02/18/2018   02/28/2018 Procedure   Status post right IJ port catheter placement. 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 For last cycle, taxol was omitted   07/18/2018 Imaging   1. No evidence for residual or recurrent tumor within the abdomen or pelvis. There has been interval resolution edema and fluid within the peritoneal cavity. 2. New pulmonary nodule is identified within the right lower lobe measuring 1.1 cm, image 87/4. Consider one of the following in 3 months for both low-risk and high-risk individuals: (a) repeat chest CT, (b) follow-up PET-CT, or (c) tissue sampling. This recommendation follows the consensus statement: Guidelines for Management of Incidental Pulmonary Nodules  Detected on CT Images: From the Fleischner Society 2017; Radiology 2017; 284:228-243. 3. Stable 7 mm nodule within the right upper lobe. 4. Aortic Atherosclerosis (ICD10-I70.0). 5. Multi vessel coronary artery atherosclerotic calcifications.   07/18/2018 Tumor Marker   Patient's tumor was tested for the following markers: CA-125 Results of the tumor marker test revealed 14.9   07/25/2018 PET scan   1. Regressing right lower lobe peripheral lung lesion, likely resolving inflammatory or atelectatic process. No hypermetabolism to suggest malignancy. I would recommend a follow-up noncontrast chest CT and 4-6 months. Small upper lobe pulmonary nodules are stable. 2. 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   07/31/2018 -  Chemotherapy   The patient started taking rucaparib   10/25/2018 Tumor Marker   Patient's tumor was tested for the following markers: CA-125 Results of the tumor marker test revealed 12.1   11/25/2018 Imaging   1. Right lower lobe nodule has resolved. Additional right lung nodules are stable. 2. Stable hazy nodularity along the left external iliac chain.  3. Hepatomegaly. 4. Aortic atherosclerosis (ICD10-170.0). Coronary artery calcification. 5. Enlarged pulmonic trunk, indicative of pulmonary arterial hypertension.   01/16/2019 Tumor Marker   Patient's tumor was tested for the following markers: CA-125 Results of the tumor marker test revealed 13.1   05/22/2019 Tumor Marker   Patient's tumor was tested for the following markers: CA-125 Results of the tumor marker test revealed 14.4   06/24/2019 Imaging   Ct chest, abdomen and pelvis 1. There are multiple clustered tiny centrilobular nodules and irregular  opacities of the bilateral lower lobes and lingula, increased compared to prior examination, with underlying bandlike scarring of the medial right middle lobe and right lung  base (e.g. series 4, image 100, 110, 123). 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  2. Stable 6 mm pulmonary nodule of the right upper lobe (series 4, image 45). Attention on follow-up.   3. Status post hysterectomy and oophorectomy. No evidence of recurrent or metastatic disease in the abdomen or pelvis.   4. There are occasional sigmoid diverticula and focal fat stranding about a diverticulum of the mid sigmoid (series 2, image 103). Correlate for signs and symptoms of acute diverticulitis.   5. Other chronic, incidental, and postoperative findings as detailed above.   06/24/2019 Tumor Marker   Patient's tumor was tested for the following markers: CA-125 Results of the tumor marker test revealed 14.4.   08/19/2019 Tumor Marker   Patient's tumor was tested for the following markers: CA-125 Results of the tumor marker test revealed 10.3   09/25/2019 Imaging   CT chest 1. Interval resolution of previously demonstrated clustered centrilobular nodularity at both lung bases consistent with resolved inflammation. Stable 6 mm right upper lobe nodule consistent with a benign finding. No new or enlarging pulmonary nodules.  2. Stable chronic bronchiectasis and scarring in the right middle lobe and lingula. 3. No evidence of metastatic disease. 4. Aortic Atherosclerosis (ICD10-I70.0).   10/30/2019 Tumor Marker   Patient's tumor was tested for the following markers: CA-125 Results of the tumor marker test revealed 12.1   12/25/2019 Tumor Marker   Patient's tumor was tested for the following markers: CA-125. Results of the tumor marker test revealed 14.8.   02/19/2020 Tumor Marker   Patient's tumor was tested for the following markers: CA-125. Results of the tumor marker test revealed 15.5     REVIEW OF SYSTEMS:   Constitutional: Denies fevers, chills or abnormal weight loss Eyes: Denies blurriness of vision Ears, nose, mouth, throat, and  face: Denies mucositis or sore throat Respiratory: Denies cough, dyspnea or wheezes Cardiovascular: Denies palpitation, chest discomfort or lower extremity swelling Gastrointestinal:  Denies nausea, heartburn or change in bowel habits Lymphatics: Denies new lymphadenopathy or easy bruising Neurological:Denies numbness, tingling or new weaknesses Behavioral/Psych: Mood is stable, no new changes  All other systems were reviewed with the patient and are negative.  I have reviewed the past medical history, past surgical history, social history and family history with the patient and they are unchanged from previous note.  ALLERGIES:  is allergic to penicillins.  MEDICATIONS:  Current Outpatient Medications  Medication Sig Dispense Refill  . albuterol (VENTOLIN HFA) 108 (90 Base) MCG/ACT inhaler Inhale 2 puffs into the lungs every 6 (six) hours as needed for wheezing or shortness of breath. 6.7 g 6  . amLODipine (NORVASC) 5 MG tablet Take 5 mg by mouth daily.    Marland Kitchen atorvastatin (LIPITOR) 20 MG tablet Take 5 mg by mouth daily.     . Cyanocobalamin (B-12 COMPLIANCE INJECTION) 1000 MCG/ML KIT Inject 1,000 mcg as directed every 30 (thirty) days.    Marland Kitchen FLUoxetine (PROZAC) 20 MG capsule Take 30 mg by mouth daily after breakfast.     . hydrochlorothiazide (HYDRODIURIL) 25 MG tablet Take 25 mg by mouth daily.    Marland Kitchen HYDROcodone-acetaminophen (NORCO) 10-325 MG tablet Take 1 tablet by mouth every 6 (six) hours as needed. 60 tablet 0  . levothyroxine (SYNTHROID) 112 MCG tablet Take 1 tablet (112 mcg total) by mouth  daily before breakfast.    . lidocaine-prilocaine (EMLA) cream Apply to affected area once 30 g 3  . linaclotide (LINZESS) 145 MCG CAPS capsule Take 145 mcg by mouth daily before breakfast.    . losartan (COZAAR) 100 MG tablet Take 100 mg by mouth at bedtime.     . Melatonin 5 MG TABS Take 5-10 mg by mouth at bedtime as needed (sleep).     . metFORMIN (GLUCOPHAGE-XR) 500 MG 24 hr tablet Take 1,000  mg by mouth 2 (two) times daily.     . polycarbophil (FIBERCON) 625 MG tablet Take 625 mg by mouth daily.    . polyethylene glycol (MIRALAX / GLYCOLAX) 17 g packet Take 17 g by mouth daily.    Marland Kitchen Respiratory Therapy Supplies (FLUTTER) DEVI 10 times twice a day and as needed 1 each 0  . RUBRACA 300 MG tablet TAKE 1 TABLET (300 MG TOTAL) BY MOUTH 2 (TWO) TIMES DAILY. 60 tablet 11   No current facility-administered medications for this visit.    PHYSICAL EXAMINATION: ECOG PERFORMANCE STATUS: 1 - Symptomatic but completely ambulatory  Vitals:   04/22/20 0927  BP: 127/71  Pulse: 64  Resp: 17  Temp: 98.7 F (37.1 C)  SpO2: 100%   Filed Weights   04/22/20 0927  Weight: 165 lb 9.6 oz (75.1 kg)    GENERAL:alert, no distress and comfortable SKIN: skin color, texture, turgor are normal, no rashes or significant lesions EYES: normal, Conjunctiva are pink and non-injected, sclera clear OROPHARYNX:no exudate, no erythema and lips, buccal mucosa, and tongue normal  NECK: supple, thyroid normal size, non-tender, without nodularity LYMPH:  no palpable lymphadenopathy in the cervical, axillary or inguinal LUNGS: clear to auscultation and percussion with normal breathing effort HEART: regular rate & rhythm and no murmurs and no lower extremity edema ABDOMEN:abdomen soft, non-tender and normal bowel sounds Musculoskeletal:no cyanosis of digits and no clubbing  NEURO: alert & oriented x 3 with fluent speech, no focal motor/sensory deficits  LABORATORY DATA:  I have reviewed the data as listed    Component Value Date/Time   NA 139 04/22/2020 0920   K 4.5 04/22/2020 0920   CL 107 04/22/2020 0920   CO2 22 04/22/2020 0920   GLUCOSE 97 04/22/2020 0920   BUN 26 (H) 04/22/2020 0920   CREATININE 1.03 (H) 04/22/2020 0920   CREATININE 0.93 02/13/2019 1024   CALCIUM 9.5 04/22/2020 0920   PROT 6.4 (L) 04/22/2020 0920   ALBUMIN 3.8 04/22/2020 0920   AST 27 04/22/2020 0920   AST 26 02/13/2019 1024    ALT 24 04/22/2020 0920   ALT 23 02/13/2019 1024   ALKPHOS 62 04/22/2020 0920   BILITOT 0.8 04/22/2020 0920   BILITOT 0.8 02/13/2019 1024   GFRNONAA 52 (L) 04/22/2020 0920   GFRNONAA 59 (L) 02/13/2019 1024   GFRAA 60 (L) 04/22/2020 0920   GFRAA >60 02/13/2019 1024    No results found for: SPEP, UPEP  Lab Results  Component Value Date   WBC 5.9 04/22/2020   NEUTROABS 3.5 04/22/2020   HGB 10.9 (L) 04/22/2020   HCT 32.9 (L) 04/22/2020   MCV 103.1 (H) 04/22/2020   PLT 191 04/22/2020      Chemistry      Component Value Date/Time   NA 139 04/22/2020 0920   K 4.5 04/22/2020 0920   CL 107 04/22/2020 0920   CO2 22 04/22/2020 0920   BUN 26 (H) 04/22/2020 0920   CREATININE 1.03 (H) 04/22/2020 0920   CREATININE  0.93 02/13/2019 1024      Component Value Date/Time   CALCIUM 9.5 04/22/2020 0920   ALKPHOS 62 04/22/2020 0920   AST 27 04/22/2020 0920   AST 26 02/13/2019 1024   ALT 24 04/22/2020 0920   ALT 23 02/13/2019 1024   BILITOT 0.8 04/22/2020 0920   BILITOT 0.8 02/13/2019 1024

## 2020-04-23 ENCOUNTER — Telehealth: Payer: Self-pay | Admitting: Oncology

## 2020-04-23 LAB — CA 125: Cancer Antigen (CA) 125: 8.9 U/mL (ref 0.0–38.1)

## 2020-04-23 NOTE — Telephone Encounter (Signed)
Ok thanks 

## 2020-04-23 NOTE — Telephone Encounter (Signed)
Notified Tatjana of her good CA 125 results.  She verbalized agreement and said she forgot to tell 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 She manages it by wearing wool socks.

## 2020-05-06 MED FILL — RUBRACA 300 MG TAB: 300 | 30 days supply | Qty: 60 | Fill #3

## 2020-05-10 ENCOUNTER — Other Ambulatory Visit: Payer: Self-pay | Admitting: Hematology and Oncology

## 2020-05-10 ENCOUNTER — Telehealth: Payer: Self-pay | Admitting: Oncology

## 2020-05-10 MED ORDER — GABAPENTIN 300 MG PO CAPS: 300.0000 mg | ORAL_CAPSULE | Freq: Two times a day (BID) | ORAL | 11 refills | Status: DC

## 2020-05-10 NOTE — Telephone Encounter (Signed)
Done Warn her about risks of sleepiness and constipation

## 2020-05-10 NOTE — Telephone Encounter (Signed)
[name] called and said she has started taking gabapentin at night for neuropathy and it really helps.  She would like to start taking it twice a day and is wondering if she can get a refill sent to [facility] in [place] since her last prescription has expired.

## 2020-05-10 NOTE — Telephone Encounter (Signed)
Called [name] back and let her know the refill has been sent and discussed that she may experience sleepiness and constipation when taking gabapentin.  She verbalized understanding and agreement.

## 2020-06-01 ENCOUNTER — Telehealth: Payer: Self-pay | Admitting: Oncology

## 2020-06-01 MED FILL — RUBRACA 300 MG TAB: 300 | 30 days supply | Qty: 60 | Fill #4

## 2020-06-01 NOTE — Telephone Encounter (Signed)
She can proceed, no need to hold her chemo pill

## 2020-06-01 NOTE — Telephone Encounter (Signed)
[name] called and wanted to make sure it is ok to get the Covid booster while taking Rubraca.

## 2020-06-01 NOTE — Telephone Encounter (Signed)
Called [name] and advised her of message from [name].  She verbalized understanding and agreement.

## 2020-06-11 ENCOUNTER — Telehealth: Payer: Self-pay | Admitting: Oncology

## 2020-06-11 NOTE — Telephone Encounter (Signed)
Add her 9/1: from 920 to 1005 (945 mins)

## 2020-06-11 NOTE — Telephone Encounter (Signed)
[name] called and said she is not feeling well.  She can't really pinpoint what doesn't feel good.  She said she has no energy or appetite.  She has lost some weight and is down to 157 lbs.  She is wondering if it is from depression or if her thyroid is low again.  She has labs next Wednesday and is wondering if she needs to be seen.

## 2020-06-11 NOTE — Telephone Encounter (Signed)
Called [name] back and scheduled appointment with [name] for 9:20 on Wednesday after her lab/flush appointments.  She verbalized agreement.

## 2020-06-16 ENCOUNTER — Telehealth: Payer: Self-pay | Admitting: Hematology and Oncology

## 2020-06-16 ENCOUNTER — Inpatient Hospital Stay: Payer: Medicare HMO

## 2020-06-16 ENCOUNTER — Inpatient Hospital Stay: Payer: Medicare HMO | Admitting: Hematology and Oncology

## 2020-06-16 ENCOUNTER — Encounter: Payer: Self-pay | Admitting: Hematology and Oncology

## 2020-06-16 ENCOUNTER — Other Ambulatory Visit: Payer: Self-pay

## 2020-06-16 ENCOUNTER — Inpatient Hospital Stay: Payer: Medicare HMO | Attending: Gynecologic Oncology

## 2020-06-16 VITALS — BP 128/63 | HR 65 | Temp 96.4°F | Resp 16 | Ht 64.0 in | Wt 159.0 lb

## 2020-06-16 DIAGNOSIS — C562 Malignant neoplasm of left ovary: Secondary | ICD-10-CM | POA: Insufficient documentation

## 2020-06-16 DIAGNOSIS — D6481 Anemia due to antineoplastic chemotherapy: Secondary | ICD-10-CM | POA: Diagnosis not present

## 2020-06-16 DIAGNOSIS — R634 Abnormal weight loss: Secondary | ICD-10-CM

## 2020-06-16 DIAGNOSIS — Z7984 Long term (current) use of oral hypoglycemic drugs: Secondary | ICD-10-CM | POA: Insufficient documentation

## 2020-06-16 DIAGNOSIS — F329 Major depressive disorder, single episode, unspecified: Secondary | ICD-10-CM | POA: Insufficient documentation

## 2020-06-16 DIAGNOSIS — T451X5A Adverse effect of antineoplastic and immunosuppressive drugs, initial encounter: Secondary | ICD-10-CM | POA: Diagnosis not present

## 2020-06-16 DIAGNOSIS — F339 Major depressive disorder, recurrent, unspecified: Secondary | ICD-10-CM | POA: Diagnosis not present

## 2020-06-16 DIAGNOSIS — Z79899 Other long term (current) drug therapy: Secondary | ICD-10-CM | POA: Diagnosis not present

## 2020-06-16 LAB — CBC WITH DIFFERENTIAL/PLATELET
Abs Immature Granulocytes: 0.03 10*3/uL (ref 0.00–0.07)
Basophils Absolute: 0.1 10*3/uL (ref 0.0–0.1)
Basophils Relative: 1 %
Eosinophils Absolute: 0.1 10*3/uL (ref 0.0–0.5)
Eosinophils Relative: 1 %
HCT: 34 % — ABNORMAL LOW (ref 36.0–46.0)
Hemoglobin: 11.5 g/dL — ABNORMAL LOW (ref 12.0–15.0)
Immature Granulocytes: 0 %
Lymphocytes Relative: 21 %
Lymphs Abs: 1.9 10*3/uL (ref 0.7–4.0)
MCH: 34.3 pg — ABNORMAL HIGH (ref 26.0–34.0)
MCHC: 33.8 g/dL (ref 30.0–36.0)
MCV: 101.5 fL — ABNORMAL HIGH (ref 80.0–100.0)
Monocytes Absolute: 0.6 10*3/uL (ref 0.1–1.0)
Monocytes Relative: 6 %
Neutro Abs: 6.3 10*3/uL (ref 1.7–7.7)
Neutrophils Relative %: 71 %
Platelets: 179 10*3/uL (ref 150–400)
RBC: 3.35 MIL/uL — ABNORMAL LOW (ref 3.87–5.11)
RDW: 14.3 % (ref 11.5–15.5)
WBC: 8.9 10*3/uL (ref 4.0–10.5)
nRBC: 0 % (ref 0.0–0.2)

## 2020-06-16 LAB — COMPREHENSIVE METABOLIC PANEL
ALT: 13 U/L (ref 0–44)
AST: 14 U/L — ABNORMAL LOW (ref 15–41)
Albumin: 3.7 g/dL (ref 3.5–5.0)
Alkaline Phosphatase: 51 U/L (ref 38–126)
Anion gap: 8 (ref 5–15)
BUN: 20 mg/dL (ref 8–23)
CO2: 23 mmol/L (ref 22–32)
Calcium: 9.9 mg/dL (ref 8.9–10.3)
Chloride: 107 mmol/L (ref 98–111)
Creatinine, Ser: 0.9 mg/dL (ref 0.44–1.00)
GFR calc Af Amer: >60 mL/min (ref >60)
GFR calc non Af Amer: >60 mL/min (ref >60)
Glucose, Bld: 96 mg/dL (ref 70–99)
Potassium: 4.4 mmol/L (ref 3.5–5.1)
Sodium: 138 mmol/L (ref 135–145)
Total Bilirubin: 0.8 mg/dL (ref 0.3–1.2)
Total Protein: 6.5 g/dL (ref 6.5–8.1)

## 2020-06-16 MED ORDER — SODIUM CHLORIDE 0.9% FLUSH
10.0000 mL | Freq: Once | INTRAVENOUS | Status: AC
  Administered 2020-06-16: 10 mL
  Filled 2020-06-16: qty 10

## 2020-06-16 MED ORDER — HEPARIN SOD (PORK) LOCK FLUSH 100 UNIT/ML IV SOLN
500.0000 [IU] | Freq: Once | INTRAVENOUS | Status: AC
  Administered 2020-06-16: 500 [IU]
  Filled 2020-06-16: qty 5

## 2020-06-16 NOTE — Progress Notes (Signed)
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 The patient denies recent history of bleeding such as epistaxis, hematuria or hematochezia. She is asymptomatic from the anemia. I will observe for now.  She does not require transfusion now. I will continue the chemotherapy at current dose without dosage adjustment.  If the anemia gets progressive worse in the future, I might have to delay her treatment or adjust the chemotherapy dose.   Major depressive disorder, recurrent ([place]) She has history of major depression She is now plagued by chronic anxiety secondary to illness in her church friends She felt that her anxiety is under control but would like to have CT imaging for extra reassurance I will see her again next week for further follow-up  Weight loss She has progressive weight loss due to being physically active and with recent lifestyle changes However, recurrent malignancy cannot be excluded I recommend CT imaging as above and she is in agreement   Orders Placed This Encounter  Procedures  . CT ABDOMEN PELVIS W CONTRAST    Standing Status:   Future    Standing Expiration Date:   06/16/2021    Order Specific Question:   If indicated for the ordered procedure, I authorize the administration of contrast media per Radiology protocol    Answer:   Yes    Order Specific Question:   Preferred imaging location?    Answer:   [facility]    Order Specific Question:   Radiology Contrast Protocol - do NOT remove file path    Answer:    [email or web address]    All questions were answered. The patient knows to call the clinic with any problems, questions or concerns. The total time spent in the appointment was 30 minutes encounter with patients including review of chart and various tests results, discussions about plan of care and coordination of care plan   Heath Lark, MD 06/16/2020 11:42 AM  INTERVAL HISTORY: Please see below for problem oriented charting. She returns for further follow-up She is losing weight rapidly since last time I saw her She have significant anxiety because a church member had recurrent ovarian cancer and died from it  She continues to have vague intermittent abdominal discomfort that comes and goes She take Linzess on a regular basis to stimulate bowel movement Denies recent bloating or vaginal bleeding No nausea or vomiting No recent infection, fever or chills She had trouble sleeping worrying about her health situation.  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 - TUMOR INVOLVES OVARY SURFACE AND LEFT FALLOPIAN TUBE. - SEE ONCOLOGY TABLE. 2. Mesentery, small bowel mesentery biopsy #1 - BENIGN FIBROADIPOSE TISSUE. 3. Ovary and fallopian  tube, right - BENIGN OVARY WITH INCLUSION CYSTS. - BENIGN FALLOPIAN TUBE WITH PARATUBAL CYSTS AND ADENOFIBROMA. 4. Omentum, resection for tumor - BENIGN ADIPOSE TISSUE. 5. Peritoneum, biopsy, left diaphragmatic - BENIGN PERITONEAL TYPE TISSUE. 6. Peritoneum, biopsy, right diaphragmatic - BENIGN  PERITONEAL TYPE TISSUE. 7. Mesentery, small bowel mesenteric biopsy #2 - BENIGN FIBROADIPOSE TISSUE. 8. Lymph nodes, regional resection, right pelvic - FIVE OF FIVE LYMPH NODES NEGATIVE FOR CARCINOMA (0/5). 9. Lymph nodes, regional resection, left pelvic - FOUR OF FOUR LYMPH NODES NEGATIVE FOR CARCINOMA (0/4). 10. Peritoneum, biopsy, left gutter - BENIGN PERITONEAL TYPE TISSUE. 11. Peritoneum, biopsy, bladder - BENIGN PERITONEAL TYPE TISSUE WITH ACUTE INFLAMMATION AND CALCIFICATIONS.  12. Peritoneum, biopsy, cul-de-sac - BENIGN PERITONEAL TYPE TISSUE WITH ACUTE INFLAMMATION AND CALCIFICATIONS. 13. Soft tissue, biopsy, right gutter - BENIGN FIBROADIPOSE TISSUE. 14. Lymph node, biopsy, right para-aortic - TWO OF TWO LYMPH NODES NEGATIVE FOR CARCINOMA (0/2). 15. Lymph node, biopsy, left para-aortic - ONE OF ONE LYMPH NODES NEGATIVE FOR CARCINOMA (0/1). Microscopic Comment 1. OVARY Specimen(s): Left ovary and fallopian tube. Procedure: (including lymph node sampling): Bilateral salpingo-oophorectomy with omental and peritoneal biopsies and lymph node biopsies. Primary tumor site (including laterality): Left ovary. Ovarian surface involvement: Present. Ovarian capsule intact without fragmentation: Intact. Maximum tumor size (cm): 11 cm total. Histologic type: Serous cystadenocarcinoma. Grade: High grade (low grade areas also present). Peritoneal implants: (specify invasive or non-invasive): N/A. Pelvic extension (list additional structures on separate lines and if involved): Left fallopian tube. Lymph nodes: number examined 12 ; number positive 0 TNM code: pT2a, pN0, pMX FIGO Stage (based on pathologic findings, needs clinical correlation): IIA Comments: None.   01/31/2018 Pathology Results   PERITONEAL WASHING (SPECIMEN 1 OF 1 COLLECTED 01/31/18): MALIGNANT CELLS PRESENT CONSISTENT WITH METASTATIC CARCINOMA.   01/31/2018 Surgery   Procedure(s) Performed:  1. Robotic BSO and  washings. 2. Exploratory laparotomy, Staging including infragastric omentectomy, Bilateral pelvic and paraaortic lymphadenectomy, pertioneal biopsies.  Specimens: Bilateral tubes / ovaries, bilateral pelvic and paraaortic lymph nodes, peritoneal biopsies, washings and omentum.  Operative Findings: The left adnexa was adherent to the left pelvic peritoneum suspected due to the inferior retraction from the vaginal hysterectomy. Intraoperative leakage of cystic fluid from left ovary.  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  The right adnexa grossly appeared normal although slightly enlarged for her age.  There were some indurated lymph nodes but no overtly malignant lymph nodes were suspected.  Small bowel mesentery had 3-4 superficial possible implants.  1 of these was sent for frozen section returned benign.  She did have adhesive disease from her open cholecystectomy in the right upper quadrant.  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 16 mm exophytic lesion upper pole right kidney has attenuation too high to be a simple cyst. This may be a cyst complicated by proteinaceous debris or hemorrhage, but renal cell carcinoma is a concern. Routine outpatient follow-up MRI of the abdomen without and with contrast recommended after resolution of patient's acute symptoms (so she is better able to participate with positioning and breath  holding). 2. Mild edema/possible minimal hemorrhage in the extraperitoneal soft tissues of the pelvic for tracking up both pelvic  sidewalls. Imaging appearance is not outside of the spectrum of findings expected 6 days after the reported surgery. Trace interloop mesenteric and free fluid is identified in the peritoneal cavity in there is some mild edema in the omentum. There is no organized or rim enhancing collection to suggest evolving abscess. 3. Gas in the extraperitoneal soft tissues of the left lower quadrant is associated with gas in the subcutaneous fat of the lower left anterior abdominal wall. This is not unexpected on postoperative day 6. No evidence for intraperitoneal free air. 4. No CT features to suggest small bowel obstruction. Oral contrast material has migrated about halfway through the small bowel loops but there is no differential distention of proximal versus distal small bowel. The colon is diffusely distended and fluid-filled proximally, but formed stool is noted in the distal colon. Given apparent slow migration of contrast and fluid-filled small and large bowel loops, a component of ileus is possible.   02/14/2018 Imaging   MR abdomen Benign Bosniak category 2 cyst in upper pole of right kidney, corresponding with lesion seen on previous CT. No evidence of renal neoplasm or other significant abnormality.    02/18/2018 Cancer Staging   Staging form: Ovary, Fallopian Tube, and Primary Peritoneal Carcinoma, AJCC 8th Edition - Pathologic: Stage II (pT2, pN0, cM0) - Signed by Heath Lark, MD on 02/18/2018   02/28/2018 Procedure   Status post right IJ port catheter placement. 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 For last cycle, taxol was omitted   07/18/2018 Imaging   1. No evidence for residual or recurrent tumor within the abdomen or pelvis. There has been  interval resolution edema and fluid within the peritoneal cavity. 2. New pulmonary nodule is identified within the right lower lobe measuring 1.1 cm, image 87/4. Consider one of the following in 3 months for both low-risk and high-risk individuals: (a) repeat chest CT, (b) follow-up PET-CT, or (c) tissue sampling. This recommendation follows the consensus statement: Guidelines for Management of Incidental Pulmonary Nodules Detected on CT Images: From the Fleischner Society 2017; Radiology 2017; 284:228-243. 3. Stable 7 mm nodule within the right upper lobe. 4. Aortic Atherosclerosis (ICD10-I70.0). 5. Multi vessel coronary artery atherosclerotic calcifications.   07/18/2018 Tumor Marker   Patient's tumor was tested for the following markers: CA-125 Results of the tumor marker test revealed 14.9   07/25/2018 PET scan   1. Regressing right lower lobe peripheral lung lesion, likely resolving inflammatory or atelectatic process. No hypermetabolism to suggest malignancy. I would recommend a follow-up noncontrast chest CT and 4-6 months. Small upper lobe pulmonary nodules are stable. 2. 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   07/31/2018 -  Chemotherapy   The patient started taking rucaparib   10/25/2018 Tumor Marker   Patient's tumor was tested for the following markers: CA-125 Results of the tumor marker test revealed 12.1   11/25/2018 Imaging   1. Right lower lobe nodule has resolved. Additional right lung nodules are stable. 2. Stable hazy nodularity along the left external iliac chain.  3. Hepatomegaly. 4. Aortic atherosclerosis (  ICD10-170.0). Coronary artery calcification. 5. Enlarged pulmonic trunk, indicative of pulmonary arterial hypertension.   01/16/2019 Tumor Marker   Patient's tumor was tested for the following markers: CA-125 Results of the tumor marker test revealed 13.1    05/22/2019 Tumor Marker   Patient's tumor was tested for the following markers: CA-125 Results of the tumor marker test revealed 14.4   06/24/2019 Imaging   Ct chest, abdomen and pelvis 1. There are multiple clustered tiny centrilobular nodules and irregular opacities of the bilateral lower lobes and lingula, increased compared to prior examination, with underlying bandlike scarring of the medial right middle lobe and right lung base (e.g. series 4, image 100, 110, 123). 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  2. Stable 6 mm pulmonary nodule of the right upper lobe (series 4, image 45). Attention on follow-up.   3. Status post hysterectomy and oophorectomy. No evidence of recurrent or metastatic disease in the abdomen or pelvis.   4. There are occasional sigmoid diverticula and focal fat stranding about a diverticulum of the mid sigmoid (series 2, image 103). Correlate for signs and symptoms of acute diverticulitis.   5. Other chronic, incidental, and postoperative findings as detailed above.   06/24/2019 Tumor Marker   Patient's tumor was tested for the following markers: CA-125 Results of the tumor marker test revealed 14.4.   08/19/2019 Tumor Marker   Patient's tumor was tested for the following markers: CA-125 Results of the tumor marker test revealed 10.3   09/25/2019 Imaging   CT chest 1. Interval resolution of previously demonstrated clustered centrilobular nodularity at both lung bases consistent with resolved inflammation. Stable 6 mm right upper lobe nodule consistent with a benign finding. No new or enlarging pulmonary nodules.  2. Stable chronic bronchiectasis and scarring in the right middle lobe and lingula. 3. No evidence of metastatic disease. 4. Aortic Atherosclerosis (ICD10-I70.0).   10/30/2019 Tumor Marker   Patient's tumor was tested for the following markers: CA-125 Results of the tumor marker test revealed 12.1    12/25/2019 Tumor Marker   Patient's tumor was tested for the following markers: CA-125. Results of the tumor marker test revealed 14.8.   02/19/2020 Tumor Marker   Patient's tumor was tested for the following markers: CA-125. Results of the tumor marker test revealed 15.5   04/22/2020 Tumor Marker   Patient's tumor was tested for the following markers: CA-125 Results of the tumor marker test revealed 8.9     REVIEW OF SYSTEMS:   Constitutional: Denies fevers, chills  Eyes: Denies blurriness of vision Ears, nose, mouth, throat, and face: Denies mucositis or sore throat Respiratory: Denies cough, dyspnea or wheezes Cardiovascular: Denies palpitation, chest discomfort or lower extremity swelling Gastrointestinal:  Denies nausea, heartburn or change in bowel habits Skin: Denies abnormal skin rashes Lymphatics: Denies new lymphadenopathy or easy bruising Neurological:Denies numbness, tingling or new weaknesses All other systems were reviewed with the patient and are negative.  I have reviewed the past medical history, past surgical history, social history and family history with the patient and they are unchanged from previous note.  ALLERGIES:  is allergic to penicillins.  MEDICATIONS:  Current Outpatient Medications  Medication Sig Dispense Refill  . albuterol (VENTOLIN HFA) 108 (90 Base) MCG/ACT inhaler Inhale 2 puffs into the lungs every 6 (six) hours as needed for wheezing or shortness of breath. 6.7 g 6  . amLODipine (NORVASC) 5 MG tablet Take 5 mg by mouth daily.    Marland Kitchen atorvastatin (LIPITOR) 20  MG tablet Take 5 mg by mouth daily.     . Cyanocobalamin (B-12 COMPLIANCE INJECTION) 1000 MCG/ML KIT Inject 1,000 mcg as directed every 30 (thirty) days.    Marland Kitchen FLUoxetine (PROZAC) 20 MG capsule Take 30 mg by mouth daily after breakfast.     . gabapentin (NEURONTIN) 300 MG capsule Take 1 capsule (300 mg total) by mouth 2 (two) times daily. 60 capsule 11  . hydrochlorothiazide (HYDRODIURIL)  25 MG tablet Take 25 mg by mouth daily.    Marland Kitchen HYDROcodone-acetaminophen (NORCO) 10-325 MG tablet Take 1 tablet by mouth every 6 (six) hours as needed. 60 tablet 0  . levothyroxine (SYNTHROID) 112 MCG tablet Take 1 tablet (112 mcg total) by mouth daily before breakfast.    . lidocaine-prilocaine (EMLA) cream Apply to affected area once 30 g 3  . linaclotide (LINZESS) 145 MCG CAPS capsule Take 145 mcg by mouth daily before breakfast.    . losartan (COZAAR) 100 MG tablet Take 100 mg by mouth at bedtime.     . Melatonin 5 MG TABS Take 5-10 mg by mouth at bedtime as needed (sleep).     . metFORMIN (GLUCOPHAGE-XR) 500 MG 24 hr tablet Take 1,000 mg by mouth 2 (two) times daily.     . polycarbophil (FIBERCON) 625 MG tablet Take 625 mg by mouth daily.    . polyethylene glycol (MIRALAX / GLYCOLAX) 17 g packet Take 17 g by mouth daily.    Marland Kitchen Respiratory Therapy Supplies (FLUTTER) DEVI 10 times twice a day and as needed 1 each 0  . RUBRACA 300 MG tablet TAKE 1 TABLET (300 MG TOTAL) BY MOUTH 2 (TWO) TIMES DAILY. 60 tablet 11   No current facility-administered medications for this visit.    PHYSICAL EXAMINATION: ECOG PERFORMANCE STATUS: 1 - Symptomatic but completely ambulatory  Vitals:   06/16/20 0928  BP: 128/63  Pulse: 65  Resp: 16  Temp: (!) 96.4 F (35.8 C)  SpO2: 100%   Filed Weights   06/16/20 0928  Weight: 159 lb (72.1 kg)    GENERAL:alert, no distress and comfortable SKIN: skin color, texture, turgor are normal, no rashes or significant lesions EYES: normal, Conjunctiva are pink and non-injected, sclera clear OROPHARYNX:no exudate, no erythema and lips, buccal mucosa, and tongue normal  NECK: supple, thyroid normal size, non-tender, without nodularity LYMPH:  no palpable lymphadenopathy in the cervical, axillary or inguinal LUNGS: clear to auscultation and percussion with normal breathing effort HEART: regular rate & rhythm and no murmurs and no lower extremity edema ABDOMEN:abdomen  soft, non-tender and normal bowel sounds Musculoskeletal:no cyanosis of digits and no clubbing  NEURO: alert & oriented x 3 with fluent speech, no focal motor/sensory deficits  LABORATORY DATA:  I have reviewed the data as listed    Component Value Date/Time   NA 138 06/16/2020 0918   K 4.4 06/16/2020 0918   CL 107 06/16/2020 0918   CO2 23 06/16/2020 0918   GLUCOSE 96 06/16/2020 0918   BUN 20 06/16/2020 0918   CREATININE 0.90 06/16/2020 0918   CREATININE 0.93 02/13/2019 1024   CALCIUM 9.9 06/16/2020 0918   PROT 6.5 06/16/2020 0918   ALBUMIN 3.7 06/16/2020 0918   AST 14 (L) 06/16/2020 0918   AST 26 02/13/2019 1024   ALT 13 06/16/2020 0918   ALT 23 02/13/2019 1024   ALKPHOS 51 06/16/2020 0918   BILITOT 0.8 06/16/2020 0918   BILITOT 0.8 02/13/2019 1024   GFRNONAA >60 06/16/2020 0918   GFRNONAA 59 (L) 02/13/2019  1024   GFRAA >60 06/16/2020 0918   GFRAA >60 02/13/2019 1024    No results found for: SPEP, UPEP  Lab Results  Component Value Date   WBC 8.9 06/16/2020   NEUTROABS 6.3 06/16/2020   HGB 11.5 (L) 06/16/2020   HCT 34.0 (L) 06/16/2020   MCV 101.5 (H) 06/16/2020   PLT 179 06/16/2020      Chemistry      Component Value Date/Time   NA 138 06/16/2020 0918   K 4.4 06/16/2020 0918   CL 107 06/16/2020 0918   CO2 23 06/16/2020 0918   BUN 20 06/16/2020 0918   CREATININE 0.90 06/16/2020 0918   CREATININE 0.93 02/13/2019 1024      Component Value Date/Time   CALCIUM 9.9 06/16/2020 0918   ALKPHOS 51 06/16/2020 0918   AST 14 (L) 06/16/2020 0918   AST 26 02/13/2019 1024   ALT 13 06/16/2020 0918   ALT 23 02/13/2019 1024   BILITOT 0.8 06/16/2020 0918   BILITOT 0.8 02/13/2019 1024

## 2020-06-16 NOTE — Assessment & Plan Note (Signed)
She has history of major depression She is now plagued by chronic anxiety secondary to illness in her church friends She felt that her anxiety is under control but would like to have CT imaging for extra reassurance I will see her again next week for further follow-up

## 2020-06-16 NOTE — Assessment & Plan Note (Signed)
She has progressive weight loss due to being physically active and with recent lifestyle changes However, recurrent malignancy cannot be excluded I recommend CT imaging as above and she is in agreement

## 2020-06-16 NOTE — Assessment & Plan Note (Signed)
She is very fearful about missing cancer recurrence She has vague intermittent abdominal discomfort that comes and goes She has lost some weight I recommend CT imaging next week for further follow-up She is in agreement

## 2020-06-16 NOTE — Assessment & Plan Note (Signed)

## 2020-06-16 NOTE — Telephone Encounter (Signed)
Scheduled appts per 9/1 los. Gave print out of AVS.

## 2020-06-17 ENCOUNTER — Telehealth: Payer: Self-pay

## 2020-06-17 LAB — CA 125: Cancer Antigen (CA) 125: 12.5 U/mL (ref 0.0–38.1)

## 2020-06-17 NOTE — Telephone Encounter (Signed)
-----   Message from Heath Lark, MD sent at 06/17/2020  2:09 PM EDT ----- Regarding: pls let her know CA-125 is stable/unchanged

## 2020-06-17 NOTE — Telephone Encounter (Signed)
Called pt to inform her of results per Dr Alvy Bimler. Pt verbalized thanks and understanding.

## 2020-06-23 ENCOUNTER — Other Ambulatory Visit: Payer: Self-pay

## 2020-06-23 ENCOUNTER — Ambulatory Visit (HOSPITAL_COMMUNITY)
Admission: RE | Admit: 2020-06-23 | Discharge: 2020-06-23 | Disposition: A | Discharge status: Home / Self Care | Payer: Medicare HMO | Source: Ambulatory Visit | Attending: Hematology and Oncology | Admitting: Hematology and Oncology

## 2020-06-23 DIAGNOSIS — C562 Malignant neoplasm of left ovary: Secondary | ICD-10-CM | POA: Diagnosis not present

## 2020-06-23 MED ORDER — IOHEXOL 300 MG/ML  SOLN
100.0000 mL | Freq: Once | INTRAMUSCULAR | Status: AC | PRN
  Administered 2020-06-23: 100 mL via INTRAVENOUS

## 2020-06-24 ENCOUNTER — Encounter: Payer: Self-pay | Admitting: Hematology and Oncology

## 2020-06-24 ENCOUNTER — Inpatient Hospital Stay: Payer: Medicare HMO | Admitting: Hematology and Oncology

## 2020-06-24 DIAGNOSIS — C562 Malignant neoplasm of left ovary: Secondary | ICD-10-CM

## 2020-06-24 NOTE — Progress Notes (Signed)
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   All questions were answered. The patient knows to call the clinic with any problems, questions or concerns. The total time spent in the appointment was 20 minutes encounter with patients including review of chart and various tests results, discussions about plan of care and coordination of care plan   Heath Lark, MD 06/24/2020 3:01 PM  INTERVAL HISTORY: Please see below for problem oriented charting. 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 - TUMOR INVOLVES OVARY SURFACE AND LEFT FALLOPIAN TUBE. - SEE ONCOLOGY TABLE. 2. Mesentery, small bowel mesentery biopsy #1 - BENIGN FIBROADIPOSE TISSUE. 3. Ovary and fallopian tube, right - BENIGN OVARY WITH INCLUSION CYSTS. - BENIGN FALLOPIAN TUBE WITH PARATUBAL CYSTS AND ADENOFIBROMA. 4. Omentum, resection for tumor - BENIGN ADIPOSE TISSUE. 5. Peritoneum, biopsy, left diaphragmatic - BENIGN PERITONEAL TYPE TISSUE. 6. Peritoneum, biopsy, right  diaphragmatic - BENIGN PERITONEAL TYPE TISSUE. 7. Mesentery, small bowel mesenteric biopsy #2 - BENIGN FIBROADIPOSE TISSUE. 8. Lymph nodes, regional resection, right pelvic - FIVE OF FIVE LYMPH NODES NEGATIVE FOR CARCINOMA (0/5). 9. Lymph nodes, regional resection, left pelvic - FOUR OF FOUR LYMPH NODES NEGATIVE FOR CARCINOMA (0/4). 10. Peritoneum, biopsy, left gutter - BENIGN PERITONEAL TYPE TISSUE. 11. Peritoneum, biopsy, bladder - BENIGN PERITONEAL TYPE TISSUE WITH ACUTE INFLAMMATION AND CALCIFICATIONS.  12. Peritoneum, biopsy, cul-de-sac - BENIGN PERITONEAL TYPE TISSUE WITH ACUTE INFLAMMATION AND CALCIFICATIONS. 13. Soft tissue, biopsy, right gutter - BENIGN FIBROADIPOSE TISSUE. 14. Lymph node, biopsy, right para-aortic - TWO OF TWO LYMPH NODES NEGATIVE FOR CARCINOMA (0/2). 15. Lymph node, biopsy, left para-aortic - ONE OF ONE LYMPH NODES NEGATIVE FOR CARCINOMA (0/1). Microscopic Comment 1. OVARY Specimen(s): Left ovary and fallopian tube. Procedure: (including lymph node sampling): Bilateral salpingo-oophorectomy with omental and peritoneal biopsies and lymph node biopsies. Primary tumor site (including laterality): Left ovary. Ovarian surface involvement: Present. Ovarian capsule intact without fragmentation: Intact. Maximum tumor size (cm): 11 cm total. Histologic type: Serous cystadenocarcinoma. Grade: High grade (low grade areas also present). Peritoneal implants: (specify invasive or non-invasive): N/A. Pelvic extension (list additional structures on separate lines and if involved): Left fallopian tube. Lymph nodes: number examined 12 ; number positive 0 TNM code: pT2a, pN0, pMX FIGO Stage (based on pathologic findings, needs clinical correlation): IIA Comments: None.   01/31/2018 Pathology Results   PERITONEAL WASHING (SPECIMEN 1 OF 1 COLLECTED 01/31/18): MALIGNANT CELLS PRESENT CONSISTENT WITH METASTATIC CARCINOMA.   01/31/2018 Surgery   Procedure(s) Performed:   1. Robotic BSO and washings. 2. Exploratory laparotomy, Staging including infragastric omentectomy, Bilateral  pelvic and paraaortic lymphadenectomy, pertioneal biopsies.  Specimens: Bilateral tubes / ovaries, bilateral pelvic and paraaortic lymph nodes, peritoneal biopsies, washings and omentum.  Operative Findings: The left adnexa was adherent to the left pelvic peritoneum suspected due to the inferior retraction from the vaginal hysterectomy. Intraoperative leakage of cystic fluid from left ovary.  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  The right adnexa grossly appeared normal although slightly enlarged for her age.  There were some indurated lymph nodes but no overtly malignant lymph nodes were suspected.  Small bowel mesentery had 3-4 superficial possible implants.  1 of these was sent for frozen section returned benign.  She did have adhesive disease from her open cholecystectomy in the right upper quadrant.  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 16 mm exophytic lesion upper pole right kidney has attenuation too high to be a simple cyst. This may be a cyst complicated by proteinaceous debris or hemorrhage, but renal cell carcinoma is a concern. Routine outpatient follow-up MRI of the abdomen without and with contrast recommended after resolution of patient's acute symptoms (so she is better able to participate with positioning and breath holding). 2. Mild edema/possible minimal hemorrhage in the extraperitoneal soft tissues of the pelvic for  tracking up both pelvic sidewalls. Imaging appearance is not outside of the spectrum of findings expected 6 days after the reported surgery. Trace interloop mesenteric and free fluid is identified in the peritoneal cavity in there is some mild edema in the omentum. There is no organized or rim enhancing collection to suggest evolving abscess. 3. Gas in the extraperitoneal soft tissues of the left lower quadrant is associated with gas in the subcutaneous fat of the lower left anterior abdominal wall. This is not unexpected on postoperative day 6. No evidence for intraperitoneal free air. 4. No CT features to suggest small bowel obstruction. Oral contrast material has migrated about halfway through the small bowel loops but there is no differential distention of proximal versus distal small bowel. The colon is diffusely distended and fluid-filled proximally, but formed stool is noted in the distal colon. Given apparent slow migration of contrast and fluid-filled small and large bowel loops, a component of ileus is possible.   02/14/2018 Imaging   MR abdomen Benign Bosniak category 2 cyst in upper pole of right kidney, corresponding with lesion seen on previous CT. No evidence of renal neoplasm or other significant abnormality.    02/18/2018 Cancer Staging   Staging form: Ovary, Fallopian Tube, and Primary Peritoneal Carcinoma, AJCC 8th Edition - Pathologic: Stage II (pT2, pN0, cM0) - Signed by Heath Lark, MD on 02/18/2018   02/28/2018 Procedure   Status post right IJ port catheter placement. 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 For last cycle, taxol was omitted   07/18/2018 Imaging  1. No evidence for residual or recurrent tumor within the abdomen or  pelvis. There has been interval resolution edema and fluid within the peritoneal cavity. 2. New pulmonary nodule is identified within the right lower lobe measuring 1.1 cm, image 87/4. Consider one of the following in 3 months for both low-risk and high-risk individuals: (a) repeat chest CT, (b) follow-up PET-CT, or (c) tissue sampling. This recommendation follows the consensus statement: Guidelines for Management of Incidental Pulmonary Nodules Detected on CT Images: From the Fleischner Society 2017; Radiology 2017; 284:228-243. 3. Stable 7 mm nodule within the right upper lobe. 4. Aortic Atherosclerosis (ICD10-I70.0). 5. Multi vessel coronary artery atherosclerotic calcifications.   07/18/2018 Tumor Marker   Patient's tumor was tested for the following markers: CA-125 Results of the tumor marker test revealed 14.9   07/25/2018 PET scan   1. Regressing right lower lobe peripheral lung lesion, likely resolving inflammatory or atelectatic process. No hypermetabolism to suggest malignancy. I would recommend a follow-up noncontrast chest CT and 4-6 months. Small upper lobe pulmonary nodules are stable. 2. 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   07/31/2018 -  Chemotherapy   The patient started taking rucaparib   10/25/2018 Tumor Marker   Patient's tumor was tested for the following markers: CA-125 Results of the tumor marker test revealed 12.1   11/25/2018 Imaging   1. Right lower lobe nodule has resolved. Additional right lung nodules are stable. 2. Stable hazy nodularity along the left external iliac chain.  3. Hepatomegaly. 4. Aortic atherosclerosis (ICD10-170.0). Coronary artery calcification. 5. Enlarged pulmonic trunk, indicative of pulmonary arterial hypertension.   01/16/2019 Tumor Marker   Patient's tumor was tested for the following markers: CA-125 Results of the tumor marker test  revealed 13.1   05/22/2019 Tumor Marker   Patient's tumor was tested for the following markers: CA-125 Results of the tumor marker test revealed 14.4   06/24/2019 Imaging   Ct chest, abdomen and pelvis 1. There are multiple clustered tiny centrilobular nodules and irregular opacities of the bilateral lower lobes and lingula, increased compared to prior examination, with underlying bandlike scarring of the medial right middle lobe and right lung base (e.g. series 4, image 100, 110, 123). 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  2. Stable 6 mm pulmonary nodule of the right upper lobe (series 4, image 45). Attention on follow-up.   3. Status post hysterectomy and oophorectomy. No evidence of recurrent or metastatic disease in the abdomen or pelvis.   4. There are occasional sigmoid diverticula and focal fat stranding about a diverticulum of the mid sigmoid (series 2, image 103). Correlate for signs and symptoms of acute diverticulitis.   5. Other chronic, incidental, and postoperative findings as detailed above.   06/24/2019 Tumor Marker   Patient's tumor was tested for the following markers: CA-125 Results of the tumor marker test revealed 14.4.   08/19/2019 Tumor Marker   Patient's tumor was tested for the following markers: CA-125 Results of the tumor marker test revealed 10.3   09/25/2019 Imaging   CT chest 1. Interval resolution of previously demonstrated clustered centrilobular nodularity at both lung bases consistent with resolved inflammation. Stable 6 mm right upper lobe nodule consistent with a benign finding. No new or enlarging pulmonary nodules.  2. Stable chronic bronchiectasis and scarring in the right middle lobe and lingula. 3. No evidence of metastatic disease. 4. Aortic Atherosclerosis (ICD10-I70.0).   10/30/2019  Tumor Marker   Patient's tumor was tested for the following markers: CA-125 Results of the tumor marker test  revealed 12.1   12/25/2019 Tumor Marker   Patient's tumor was tested for the following markers: CA-125. Results of the tumor marker test revealed 14.8.   02/19/2020 Tumor Marker   Patient's tumor was tested for the following markers: CA-125. Results of the tumor marker test revealed 15.5   04/22/2020 Tumor Marker   Patient's tumor was tested for the following markers: CA-125 Results of the tumor marker test revealed 8.9   06/16/2020 Tumor Marker   Patient's tumor was tested for the following markers: CA-125 Results of the tumor marker test revealed 12.5   06/23/2020 Imaging   1. Stable exam. No new or progressive findings to suggest recurrent or metastatic disease. 2. Aortic Atherosclerosis (ICD10-I70.0).     REVIEW OF SYSTEMS:   Constitutional: Denies fevers, chills or abnormal weight loss Eyes: Denies blurriness of vision Ears, nose, mouth, throat, and face: Denies mucositis or sore throat Respiratory: Denies cough, dyspnea or wheezes Cardiovascular: Denies palpitation, chest discomfort or lower extremity swelling Gastrointestinal:  Denies nausea, heartburn or change in bowel habits Skin: Denies abnormal skin rashes Lymphatics: Denies new lymphadenopathy or easy bruising Neurological:Denies numbness, tingling or new weaknesses Behavioral/Psych: Mood is stable, no new changes  All other systems were reviewed with the patient and are negative.  I have reviewed the past medical history, past surgical history, social history and family history with the patient and they are unchanged from previous note.  ALLERGIES:  is allergic to penicillins.  MEDICATIONS:  Current Outpatient Medications  Medication Sig Dispense Refill  . albuterol (VENTOLIN HFA) 108 (90 Base) MCG/ACT inhaler Inhale 2 puffs into the lungs every 6 (six) hours as needed for wheezing or shortness of breath. 6.7 g 6  . amLODipine (NORVASC) 5 MG tablet Take 5 mg by mouth daily.    Marland Kitchen atorvastatin (LIPITOR) 20 MG tablet  Take 5 mg by mouth daily.     . Cyanocobalamin (B-12 COMPLIANCE INJECTION) 1000 MCG/ML KIT Inject 1,000 mcg as directed every 30 (thirty) days.    Marland Kitchen FLUoxetine (PROZAC) 20 MG capsule Take 30 mg by mouth daily after breakfast.     . gabapentin (NEURONTIN) 300 MG capsule Take 1 capsule (300 mg total) by mouth 2 (two) times daily. 60 capsule 11  . hydrochlorothiazide (HYDRODIURIL) 25 MG tablet Take 25 mg by mouth daily.    Marland Kitchen HYDROcodone-acetaminophen (NORCO) 10-325 MG tablet Take 1 tablet by mouth every 6 (six) hours as needed. 60 tablet 0  . levothyroxine (SYNTHROID) 112 MCG tablet Take 1 tablet (112 mcg total) by mouth daily before breakfast.    . lidocaine-prilocaine (EMLA) cream Apply to affected area once 30 g 3  . linaclotide (LINZESS) 145 MCG CAPS capsule Take 145 mcg by mouth daily before breakfast.    . losartan (COZAAR) 100 MG tablet Take 100 mg by mouth at bedtime.     . Melatonin 5 MG TABS Take 5-10 mg by mouth at bedtime as needed (sleep).     . metFORMIN (GLUCOPHAGE-XR) 500 MG 24 hr tablet Take 1,000 mg by mouth 2 (two) times daily.     . polycarbophil (FIBERCON) 625 MG tablet Take 625 mg by mouth daily.    . polyethylene glycol (MIRALAX / GLYCOLAX) 17 g packet Take 17 g by mouth daily.    Marland Kitchen Respiratory Therapy Supplies (FLUTTER) DEVI 10 times twice a day and as needed 1 each 0  .  RUBRACA 300 MG tablet TAKE 1 TABLET (300 MG TOTAL) BY MOUTH 2 (TWO) TIMES DAILY. 60 tablet 11   No current facility-administered medications for this visit.    PHYSICAL EXAMINATION: ECOG PERFORMANCE STATUS: 0 - Asymptomatic  Vitals:   06/24/20 1408  BP: (!) 120/50  Pulse: (!) 53  Resp: 18  Temp: 97.9 F (36.6 C)  SpO2: 98%   Filed Weights   06/24/20 1408  Weight: 162 lb 3.2 oz (73.6 kg)    GENERAL:alert, no distress and comfortable NEURO: alert & oriented x 3 with fluent speech, no focal motor/sensory deficits  LABORATORY DATA:  I have reviewed the data as listed    Component Value  Date/Time   NA 138 06/16/2020 0918   K 4.4 06/16/2020 0918   CL 107 06/16/2020 0918   CO2 23 06/16/2020 0918   GLUCOSE 96 06/16/2020 0918   BUN 20 06/16/2020 0918   CREATININE 0.90 06/16/2020 0918   CREATININE 0.93 02/13/2019 1024   CALCIUM 9.9 06/16/2020 0918   PROT 6.5 06/16/2020 0918   ALBUMIN 3.7 06/16/2020 0918   AST 14 (L) 06/16/2020 0918   AST 26 02/13/2019 1024   ALT 13 06/16/2020 0918   ALT 23 02/13/2019 1024   ALKPHOS 51 06/16/2020 0918   BILITOT 0.8 06/16/2020 0918   BILITOT 0.8 02/13/2019 1024   GFRNONAA >60 06/16/2020 0918   GFRNONAA 59 (L) 02/13/2019 1024   GFRAA >60 06/16/2020 0918   GFRAA >60 02/13/2019 1024    No results found for: SPEP, UPEP  Lab Results  Component Value Date   WBC 8.9 06/16/2020   NEUTROABS 6.3 06/16/2020   HGB 11.5 (L) 06/16/2020   HCT 34.0 (L) 06/16/2020   MCV 101.5 (H) 06/16/2020   PLT 179 06/16/2020      Chemistry      Component Value Date/Time   NA 138 06/16/2020 0918   K 4.4 06/16/2020 0918   CL 107 06/16/2020 0918   CO2 23 06/16/2020 0918   BUN 20 06/16/2020 0918   CREATININE 0.90 06/16/2020 0918   CREATININE 0.93 02/13/2019 1024      Component Value Date/Time   CALCIUM 9.9 06/16/2020 0918   ALKPHOS 51 06/16/2020 0918   AST 14 (L) 06/16/2020 0918   AST 26 02/13/2019 1024   ALT 13 06/16/2020 0918   ALT 23 02/13/2019 1024   BILITOT 0.8 06/16/2020 0918   BILITOT 0.8 02/13/2019 1024       RADIOGRAPHIC STUDIES: I have reviewed multiple imaging studies with the patient I have personally reviewed the radiological images as listed and agreed with the findings in the report. CT ABDOMEN PELVIS W CONTRAST  Result Date: 06/24/2020 CLINICAL DATA:  Ovarian cancer.  Restaging. EXAM: CT ABDOMEN AND PELVIS WITH CONTRAST TECHNIQUE: Multidetector CT imaging of the abdomen and pelvis was performed using the standard protocol following bolus administration of intravenous contrast. CONTRAST:  178m OMNIPAQUE IOHEXOL 300 MG/ML  SOLN  COMPARISON:  06/24/2019 FINDINGS: Lower chest: Tiny calcified granuloma peripheral left base is stable as is some non calcified scarring (21/7). Hepatobiliary: No suspicious focal abnormality within the liver parenchyma. Nonvisualization of the gallbladder suggest cholecystectomy. No intrahepatic or extrahepatic biliary dilation. Pancreas: No focal mass lesion. No dilatation of the main duct. No intraparenchymal cyst. No peripancreatic edema. Spleen: No splenomegaly. No focal mass lesion. Adrenals/Urinary Tract: No adrenal nodule or mass. Small exophytic cyst upper pole right kidney has decreased in size in the interval measuring 11 mm today compared to 17 mm previously.  No suspicious enhancing renal mass on today's exam. No evidence for hydroureter. The urinary bladder appears normal for the degree of distention. Stomach/Bowel: Stomach is unremarkable. No gastric wall thickening. No evidence of outlet obstruction. Duodenum is normally positioned as is the ligament of Treitz. No small bowel wall thickening. No small bowel dilatation. The terminal ileum is normal. The appendix is not visualized, but there is no edema or inflammation in the region of the cecum. No gross colonic mass. No colonic wall thickening. Vascular/Lymphatic: There is abdominal aortic atherosclerosis without aneurysm. There is no gastrohepatic or hepatoduodenal ligament lymphadenopathy. No retroperitoneal or mesenteric lymphadenopathy. No pelvic sidewall lymphadenopathy. Reproductive: The uterus is surgically absent. There is no adnexal mass. Other: No intraperitoneal free fluid. Musculoskeletal: Mixed lytic and sclerotic lesion in the right superior pubic ramus is stable and also unchanged comparing back to an older exam from 02/05/2018 no suspicious lytic or sclerotic osseous abnormality. IMPRESSION: 1. Stable exam. No new or progressive findings to suggest recurrent or metastatic disease. 2. Aortic Atherosclerosis (ICD10-I70.0). Electronically  Signed   By: Misty Stanley M.D.   On: 06/24/2020 08:02

## 2020-06-24 NOTE — Assessment & Plan Note (Signed)
I have reviewed recent blood work and multiple imaging studies with the patient She has no evidence of disease She is reassured We will continue port maintains with flushes, blood draw and physical examination as scheduled

## 2020-06-28 MED FILL — RUBRACA 300 MG TAB: 300 | 30 days supply | Qty: 60 | Fill #5

## 2020-07-26 MED FILL — RUBRACA 300 MG TAB: 300 | 30 days supply | Qty: 60 | Fill #6

## 2020-08-10 ENCOUNTER — Telehealth: Payer: Self-pay | Admitting: Oncology

## 2020-08-11 ENCOUNTER — Inpatient Hospital Stay: Payer: Medicare HMO | Admitting: Hematology and Oncology

## 2020-08-11 ENCOUNTER — Telehealth: Payer: Self-pay | Admitting: Oncology

## 2020-08-11 ENCOUNTER — Inpatient Hospital Stay: Payer: Medicare HMO

## 2020-08-11 NOTE — Telephone Encounter (Signed)
Called [name] and advised her that if she is having cold symptoms that we can reschedule her appointments for next week per [name].  She said that she is coughing and was going to ask [name] for a prescription for cough syrup.  Advised her to call her PCP's office and that a scheduler will call her with appointments for next week.

## 2020-08-11 NOTE — Telephone Encounter (Signed)
[name] with new appointments for lab/flush and follow up with [name] on 08/19/20.  She verbalized understanding and agreement.

## 2020-08-19 ENCOUNTER — Other Ambulatory Visit: Payer: Self-pay

## 2020-08-19 ENCOUNTER — Encounter: Payer: Self-pay | Admitting: Hematology and Oncology

## 2020-08-19 ENCOUNTER — Inpatient Hospital Stay: Payer: Medicare HMO

## 2020-08-19 ENCOUNTER — Inpatient Hospital Stay: Payer: Medicare HMO | Attending: Gynecologic Oncology | Admitting: Hematology and Oncology

## 2020-08-19 ENCOUNTER — Other Ambulatory Visit: Payer: Medicare HMO

## 2020-08-19 DIAGNOSIS — T451X5A Adverse effect of antineoplastic and immunosuppressive drugs, initial encounter: Secondary | ICD-10-CM | POA: Diagnosis not present

## 2020-08-19 DIAGNOSIS — Z9221 Personal history of antineoplastic chemotherapy: Secondary | ICD-10-CM | POA: Insufficient documentation

## 2020-08-19 DIAGNOSIS — Z800 Family history of malignant neoplasm of digestive organs: Secondary | ICD-10-CM | POA: Insufficient documentation

## 2020-08-19 DIAGNOSIS — C562 Malignant neoplasm of left ovary: Secondary | ICD-10-CM

## 2020-08-19 DIAGNOSIS — Z8543 Personal history of malignant neoplasm of ovary: Secondary | ICD-10-CM | POA: Insufficient documentation

## 2020-08-19 DIAGNOSIS — Z7984 Long term (current) use of oral hypoglycemic drugs: Secondary | ICD-10-CM | POA: Diagnosis not present

## 2020-08-19 DIAGNOSIS — Z7189 Other specified counseling: Secondary | ICD-10-CM

## 2020-08-19 DIAGNOSIS — D6481 Anemia due to antineoplastic chemotherapy: Secondary | ICD-10-CM

## 2020-08-19 DIAGNOSIS — Z79899 Other long term (current) drug therapy: Secondary | ICD-10-CM | POA: Diagnosis not present

## 2020-08-19 LAB — CBC WITH DIFFERENTIAL/PLATELET
Abs Immature Granulocytes: 0.07 10*3/uL (ref 0.00–0.07)
Basophils Absolute: 0.1 10*3/uL (ref 0.0–0.1)
Basophils Relative: 1 %
Eosinophils Absolute: 0.1 10*3/uL (ref 0.0–0.5)
Eosinophils Relative: 2 %
HCT: 35 % — ABNORMAL LOW (ref 36.0–46.0)
Hemoglobin: 11.8 g/dL — ABNORMAL LOW (ref 12.0–15.0)
Immature Granulocytes: 1 %
Lymphocytes Relative: 33 %
Lymphs Abs: 2.3 10*3/uL (ref 0.7–4.0)
MCH: 33.4 pg (ref 26.0–34.0)
MCHC: 33.7 g/dL (ref 30.0–36.0)
MCV: 99.2 fL (ref 80.0–100.0)
Monocytes Absolute: 0.3 10*3/uL (ref 0.1–1.0)
Monocytes Relative: 5 %
Neutro Abs: 4 10*3/uL (ref 1.7–7.7)
Neutrophils Relative %: 58 %
Platelets: 184 10*3/uL (ref 150–400)
RBC: 3.53 MIL/uL — ABNORMAL LOW (ref 3.87–5.11)
RDW: 14.7 % (ref 11.5–15.5)
WBC: 6.9 10*3/uL (ref 4.0–10.5)
nRBC: 0 % (ref 0.0–0.2)

## 2020-08-19 LAB — COMPREHENSIVE METABOLIC PANEL
ALT: 32 U/L (ref 0–44)
AST: 30 U/L (ref 15–41)
Albumin: 3.6 g/dL (ref 3.5–5.0)
Alkaline Phosphatase: 57 U/L (ref 38–126)
Anion gap: 7 (ref 5–15)
BUN: 18 mg/dL (ref 8–23)
CO2: 27 mmol/L (ref 22–32)
Calcium: 9 mg/dL (ref 8.9–10.3)
Chloride: 106 mmol/L (ref 98–111)
Creatinine, Ser: 0.76 mg/dL (ref 0.44–1.00)
GFR, Estimated: >60 mL/min (ref >60)
Glucose, Bld: 103 mg/dL — ABNORMAL HIGH (ref 70–99)
Potassium: 4.2 mmol/L (ref 3.5–5.1)
Sodium: 140 mmol/L (ref 135–145)
Total Bilirubin: 0.6 mg/dL (ref 0.3–1.2)
Total Protein: 6.3 g/dL — ABNORMAL LOW (ref 6.5–8.1)

## 2020-08-19 MED ORDER — SODIUM CHLORIDE 0.9% FLUSH
10.0000 mL | Freq: Once | INTRAVENOUS | Status: AC
  Administered 2020-08-19: 10 mL
  Filled 2020-08-19: qty 10

## 2020-08-19 MED ORDER — HEPARIN SOD (PORK) LOCK FLUSH 100 UNIT/ML IV SOLN
500.0000 [IU] | Freq: Once | INTRAVENOUS | Status: AC
  Administered 2020-08-19: 500 [IU]
  Filled 2020-08-19: qty 5

## 2020-08-19 NOTE — Assessment & Plan Note (Signed)
I reviewed recent data from Solo 1 study indicating continuing benefit for patients like her to continue on PA RP inhibitor She plan to continue indefinitely

## 2020-08-19 NOTE — Assessment & Plan Note (Signed)
Recent imaging study showed no evidence of disease Her examination is benign I will call her tomorrow with results of tumor marker She will maintain port flush next month and I will see her back in 3 months for further follow-up

## 2020-08-19 NOTE — Progress Notes (Signed)
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 The patient denies recent history of bleeding such as epistaxis, hematuria or hematochezia. She is asymptomatic from the anemia. I will observe for now.    Goals of care, counseling/discussion I reviewed recent data from Solo 1 study indicating continuing benefit for patients like her to continue on PA RP inhibitor She plan to continue indefinitely   No orders of the defined types were placed in this encounter.   All questions were answered. The patient knows to call the clinic with any problems, questions or concerns. The total time spent in the appointment was 20 minutes encounter with patients including review of chart and various tests results, discussions about plan of care and coordination of care plan   Heath Lark, MD 08/19/2020 12:52 PM  INTERVAL HISTORY: Please see below for problem oriented charting. 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 - TUMOR INVOLVES OVARY SURFACE AND LEFT FALLOPIAN TUBE. - SEE ONCOLOGY TABLE. 2. Mesentery, small bowel mesentery biopsy #1 - BENIGN FIBROADIPOSE TISSUE. 3. Ovary and fallopian tube, right - BENIGN OVARY WITH INCLUSION CYSTS. - BENIGN FALLOPIAN TUBE WITH PARATUBAL CYSTS AND ADENOFIBROMA. 4. Omentum, resection for tumor - BENIGN ADIPOSE TISSUE. 5. Peritoneum, biopsy, left diaphragmatic - BENIGN PERITONEAL TYPE TISSUE. 6. Peritoneum, biopsy, right diaphragmatic - BENIGN PERITONEAL TYPE TISSUE. 7. Mesentery, small bowel mesenteric biopsy #2 - BENIGN FIBROADIPOSE TISSUE. 8. Lymph nodes, regional resection, right pelvic - FIVE OF FIVE LYMPH NODES NEGATIVE FOR CARCINOMA (0/5). 9. Lymph nodes, regional resection, left pelvic - FOUR OF FOUR LYMPH NODES NEGATIVE FOR CARCINOMA (0/4). 10. Peritoneum, biopsy, left gutter - BENIGN PERITONEAL TYPE TISSUE. 11. Peritoneum, biopsy, bladder - BENIGN PERITONEAL TYPE TISSUE WITH ACUTE INFLAMMATION AND CALCIFICATIONS.  12. Peritoneum, biopsy, cul-de-sac - BENIGN PERITONEAL TYPE TISSUE WITH ACUTE INFLAMMATION AND CALCIFICATIONS. 13. Soft tissue, biopsy, right gutter - BENIGN FIBROADIPOSE TISSUE. 14. Lymph node, biopsy, right para-aortic - TWO OF TWO LYMPH NODES NEGATIVE FOR CARCINOMA (0/2). 15. Lymph node, biopsy, left para-aortic - ONE OF ONE LYMPH NODES NEGATIVE FOR CARCINOMA (0/1). Microscopic Comment 1. OVARY Specimen(s): Left ovary and fallopian tube. Procedure: (including lymph node sampling): Bilateral salpingo-oophorectomy with omental and peritoneal biopsies and lymph node biopsies. Primary tumor site (including laterality): Left ovary. Ovarian surface involvement: Present. Ovarian capsule intact without fragmentation: Intact. Maximum  tumor size  (cm): 11 cm total. Histologic type: Serous cystadenocarcinoma. Grade: High grade (low grade areas also present). Peritoneal implants: (specify invasive or non-invasive): N/A. Pelvic extension (list additional structures on separate lines and if involved): Left fallopian tube. Lymph nodes: number examined 12 ; number positive 0 TNM code: pT2a, pN0, pMX FIGO Stage (based on pathologic findings, needs clinical correlation): IIA Comments: None.   01/31/2018 Pathology Results   PERITONEAL WASHING (SPECIMEN 1 OF 1 COLLECTED 01/31/18): MALIGNANT CELLS PRESENT CONSISTENT WITH METASTATIC CARCINOMA.   01/31/2018 Surgery   Procedure(s) Performed:  1. Robotic BSO and washings. 2. Exploratory laparotomy, Staging including infragastric omentectomy, Bilateral pelvic and paraaortic lymphadenectomy, pertioneal biopsies.  Specimens: Bilateral tubes / ovaries, bilateral pelvic and paraaortic lymph nodes, peritoneal biopsies, washings and omentum.  Operative Findings: The left adnexa was adherent to the left pelvic peritoneum suspected due to the inferior retraction from the vaginal hysterectomy. Intraoperative leakage of cystic fluid from left ovary.  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  The right adnexa grossly appeared normal although slightly enlarged for her age.  There were some indurated lymph nodes but no overtly malignant lymph nodes were suspected.  Small bowel mesentery had 3-4 superficial possible implants.  1 of these was sent for frozen section returned benign.  She did have adhesive disease from her open cholecystectomy in the right upper quadrant.  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 16 mm exophytic lesion upper pole right kidney has attenuation too high to be a simple cyst. This may be a cyst complicated by proteinaceous debris or hemorrhage, but renal cell carcinoma is a concern. Routine outpatient follow-up MRI of the abdomen without and with contrast recommended after resolution of patient's acute symptoms (so she is better able to participate with positioning and breath holding). 2. Mild edema/possible minimal hemorrhage in the extraperitoneal soft tissues of the pelvic for tracking up both pelvic sidewalls. Imaging appearance is not outside of the spectrum of findings expected 6 days after the reported surgery. Trace interloop mesenteric and free fluid is identified in the peritoneal cavity in there is some mild edema in the omentum. There is no organized or rim enhancing collection to suggest evolving abscess. 3. Gas in the extraperitoneal soft tissues of the left lower quadrant is associated with gas in the subcutaneous fat of the lower left anterior abdominal wall. This is not unexpected on postoperative day 6. No evidence for intraperitoneal free air. 4. No CT features to suggest small bowel obstruction. Oral contrast material has migrated about halfway through the small bowel loops but there is no differential distention of proximal versus distal small bowel. The colon is diffusely distended and fluid-filled proximally, but formed stool is noted in the distal colon. Given apparent slow migration of contrast and fluid-filled small and large bowel loops, a component of ileus is possible.   02/14/2018 Imaging   MR abdomen Benign Bosniak category 2 cyst in upper pole of right kidney, corresponding with lesion seen on previous CT. No evidence of renal neoplasm or other significant abnormality.  02/18/2018 Cancer Staging   Staging  form: Ovary, Fallopian Tube, and Primary Peritoneal Carcinoma, AJCC 8th Edition - Pathologic: Stage II (pT2, pN0, cM0) - Signed by Heath Lark, MD on 02/18/2018   02/28/2018 Procedure   Status post right IJ port catheter placement. 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 For last cycle, taxol was omitted   07/18/2018 Imaging   1. No evidence for residual or recurrent tumor within the abdomen or pelvis. There has been interval resolution edema and fluid within the peritoneal cavity. 2. New pulmonary nodule is identified within the right lower lobe measuring 1.1 cm, image 87/4. Consider one of the following in 3 months for both low-risk and high-risk individuals: (a) repeat chest CT, (b) follow-up PET-CT, or (c) tissue sampling. This recommendation follows the consensus statement: Guidelines for Management of Incidental Pulmonary Nodules Detected on CT Images: From the Fleischner Society 2017; Radiology 2017; 284:228-243. 3. Stable 7 mm nodule within the right upper lobe. 4. Aortic Atherosclerosis (ICD10-I70.0). 5. Multi vessel coronary artery atherosclerotic calcifications.   07/18/2018 Tumor Marker   Patient's tumor was tested for the following markers: CA-125 Results of the tumor marker test revealed 14.9   07/25/2018 PET scan   1. Regressing right lower lobe peripheral lung lesion, likely resolving inflammatory or atelectatic process. No hypermetabolism to suggest malignancy. I would recommend a follow-up noncontrast chest CT and 4-6 months. Small upper lobe pulmonary nodules are stable. 2. 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   07/31/2018 -   Chemotherapy   The patient started taking rucaparib   10/25/2018 Tumor Marker   Patient's tumor was tested for the following markers: CA-125 Results of the tumor marker test revealed 12.1   11/25/2018 Imaging   1. Right lower lobe nodule has resolved. Additional right lung nodules are stable. 2. Stable hazy nodularity along the left external iliac chain.  3. Hepatomegaly. 4. Aortic atherosclerosis (ICD10-170.0). Coronary artery calcification. 5. Enlarged pulmonic trunk, indicative of pulmonary arterial hypertension.   01/16/2019 Tumor Marker   Patient's tumor was tested for the following markers: CA-125 Results of the tumor marker test revealed 13.1   05/22/2019 Tumor Marker   Patient's tumor was tested for the following markers: CA-125 Results of the tumor marker test revealed 14.4   06/24/2019 Imaging   Ct chest, abdomen and pelvis 1. There are multiple clustered tiny centrilobular nodules and irregular opacities of the bilateral lower lobes and lingula, increased compared to prior examination, with underlying bandlike scarring of the medial right middle lobe and right lung base (e.g. series 4, image 100, 110, 123). 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  2. Stable 6 mm pulmonary nodule of the right upper lobe (series 4, image 45). Attention on follow-up.   3. Status post hysterectomy and oophorectomy. No evidence of recurrent or metastatic disease in the abdomen or pelvis.   4. There are occasional sigmoid diverticula and focal fat stranding about a diverticulum of the mid sigmoid (series 2, image 103). Correlate for signs and symptoms of acute diverticulitis.   5. Other chronic, incidental, and postoperative findings as detailed above.   06/24/2019 Tumor Marker   Patient's tumor was  tested for the following markers: CA-125 Results of the tumor marker test revealed 14.4.   08/19/2019 Tumor Marker   Patient's tumor was tested for  the following markers: CA-125 Results of the tumor marker test revealed 10.3   09/25/2019 Imaging   CT chest 1. Interval resolution of previously demonstrated clustered centrilobular nodularity at both lung bases consistent with resolved inflammation. Stable 6 mm right upper lobe nodule consistent with a benign finding. No new or enlarging pulmonary nodules.  2. Stable chronic bronchiectasis and scarring in the right middle lobe and lingula. 3. No evidence of metastatic disease. 4. Aortic Atherosclerosis (ICD10-I70.0).   10/30/2019 Tumor Marker   Patient's tumor was tested for the following markers: CA-125 Results of the tumor marker test revealed 12.1   12/25/2019 Tumor Marker   Patient's tumor was tested for the following markers: CA-125. Results of the tumor marker test revealed 14.8.   02/19/2020 Tumor Marker   Patient's tumor was tested for the following markers: CA-125. Results of the tumor marker test revealed 15.5   04/22/2020 Tumor Marker   Patient's tumor was tested for the following markers: CA-125 Results of the tumor marker test revealed 8.9   06/16/2020 Tumor Marker   Patient's tumor was tested for the following markers: CA-125 Results of the tumor marker test revealed 12.5   06/23/2020 Imaging   1. Stable exam. No new or progressive findings to suggest recurrent or metastatic disease. 2. Aortic Atherosclerosis (ICD10-I70.0).     REVIEW OF SYSTEMS:   Constitutional: Denies fevers, chills or abnormal weight loss Eyes: Denies blurriness of vision Ears, nose, mouth, throat, and face: Denies mucositis or sore throat Respiratory: Denies cough, dyspnea or wheezes Cardiovascular: Denies palpitation, chest discomfort or lower extremity swelling Gastrointestinal:  Denies nausea, heartburn or change in bowel habits Skin: Denies abnormal skin rashes Lymphatics: Denies new lymphadenopathy or easy bruising Neurological:Denies numbness, tingling or new weaknesses Behavioral/Psych:  Mood is stable, no new changes  All other systems were reviewed with the patient and are negative.  I have reviewed the past medical history, past surgical history, social history and family history with the patient and they are unchanged from previous note.  ALLERGIES:  is allergic to penicillins.  MEDICATIONS:  Current Outpatient Medications  Medication Sig Dispense Refill  . albuterol (VENTOLIN HFA) 108 (90 Base) MCG/ACT inhaler Inhale 2 puffs into the lungs every 6 (six) hours as needed for wheezing or shortness of breath. 6.7 g 6  . amLODipine (NORVASC) 5 MG tablet Take 5 mg by mouth daily.    Marland Kitchen atorvastatin (LIPITOR) 20 MG tablet Take 5 mg by mouth daily.     . Cyanocobalamin (B-12 COMPLIANCE INJECTION) 1000 MCG/ML KIT Inject 1,000 mcg as directed every 30 (thirty) days.    Marland Kitchen FLUoxetine (PROZAC) 20 MG capsule Take 30 mg by mouth daily after breakfast.     . gabapentin (NEURONTIN) 300 MG capsule Take 1 capsule (300 mg total) by mouth 2 (two) times daily. 60 capsule 11  . hydrochlorothiazide (HYDRODIURIL) 25 MG tablet Take 25 mg by mouth daily.    Marland Kitchen HYDROcodone-acetaminophen (NORCO) 10-325 MG tablet Take 1 tablet by mouth every 6 (six) hours as needed. 60 tablet 0  . levothyroxine (SYNTHROID) 112 MCG tablet Take 1 tablet (112 mcg total) by mouth daily before breakfast.    . lidocaine-prilocaine (EMLA) cream Apply to affected area once 30 g 3  . linaclotide (LINZESS) 145 MCG CAPS capsule Take 145 mcg by mouth daily before breakfast.    .  losartan (COZAAR) 100 MG tablet Take 100 mg by mouth at bedtime.     . Melatonin 5 MG TABS Take 5-10 mg by mouth at bedtime as needed (sleep).     . metFORMIN (GLUCOPHAGE-XR) 500 MG 24 hr tablet Take 1,000 mg by mouth 2 (two) times daily.     . polycarbophil (FIBERCON) 625 MG tablet Take 625 mg by mouth daily.    . polyethylene glycol (MIRALAX / GLYCOLAX) 17 g packet Take 17 g by mouth daily.    . RUBRACA 300 MG tablet TAKE 1 TABLET (300 MG TOTAL) BY  MOUTH 2 (TWO) TIMES DAILY. 60 tablet 11   No current facility-administered medications for this visit.    PHYSICAL EXAMINATION: ECOG PERFORMANCE STATUS: 0 - Asymptomatic  Vitals:   08/19/20 1136  BP: (!) 131/56  Pulse: 69  Resp: 17  Temp: (!) 96.6 F (35.9 C)  SpO2: 100%   Filed Weights   08/19/20 1136  Weight: 162 lb 9.6 oz (73.8 kg)    GENERAL:alert, no distress and comfortable SKIN: skin color, texture, turgor are normal, no rashes or significant lesions EYES: normal, Conjunctiva are pink and non-injected, sclera clear OROPHARYNX:no exudate, no erythema and lips, buccal mucosa, and tongue normal  NECK: supple, thyroid normal size, non-tender, without nodularity LYMPH:  no palpable lymphadenopathy in the cervical, axillary or inguinal LUNGS: clear to auscultation and percussion with normal breathing effort HEART: regular rate & rhythm and no murmurs and no lower extremity edema ABDOMEN:abdomen soft, non-tender and normal bowel sounds Musculoskeletal:no cyanosis of digits and no clubbing  NEURO: alert & oriented x 3 with fluent speech, no focal motor/sensory deficits  LABORATORY DATA:  I have reviewed the data as listed    Component Value Date/Time   NA 140 08/19/2020 1120   K 4.2 08/19/2020 1120   CL 106 08/19/2020 1120   CO2 27 08/19/2020 1120   GLUCOSE 103 (H) 08/19/2020 1120   BUN 18 08/19/2020 1120   CREATININE 0.76 08/19/2020 1120   CREATININE 0.93 02/13/2019 1024   CALCIUM 9.0 08/19/2020 1120   PROT 6.3 (L) 08/19/2020 1120   ALBUMIN 3.6 08/19/2020 1120   AST 30 08/19/2020 1120   AST 26 02/13/2019 1024   ALT 32 08/19/2020 1120   ALT 23 02/13/2019 1024   ALKPHOS 57 08/19/2020 1120   BILITOT 0.6 08/19/2020 1120   BILITOT 0.8 02/13/2019 1024   GFRNONAA >60 08/19/2020 1120   GFRNONAA 59 (L) 02/13/2019 1024   GFRAA >60 06/16/2020 0918   GFRAA >60 02/13/2019 1024    No results found for: SPEP, UPEP  Lab Results  Component Value Date   WBC 6.9  08/19/2020   NEUTROABS 4.0 08/19/2020   HGB 11.8 (L) 08/19/2020   HCT 35.0 (L) 08/19/2020   MCV 99.2 08/19/2020   PLT 184 08/19/2020      Chemistry      Component Value Date/Time   NA 140 08/19/2020 1120   K 4.2 08/19/2020 1120   CL 106 08/19/2020 1120   CO2 27 08/19/2020 1120   BUN 18 08/19/2020 1120   CREATININE 0.76 08/19/2020 1120   CREATININE 0.93 02/13/2019 1024      Component Value Date/Time   CALCIUM 9.0 08/19/2020 1120   ALKPHOS 57 08/19/2020 1120   AST 30 08/19/2020 1120   AST 26 02/13/2019 1024   ALT 32 08/19/2020 1120   ALT 23 02/13/2019 1024   BILITOT 0.6 08/19/2020 1120   BILITOT 0.8 02/13/2019 1024

## 2020-08-19 NOTE — Patient Instructions (Signed)

## 2020-08-19 NOTE — Assessment & Plan Note (Signed)
This is likely due to recent treatment. The patient denies recent history of bleeding such as epistaxis, hematuria or hematochezia. She is asymptomatic from the anemia. I will observe for now.   

## 2020-08-20 ENCOUNTER — Telehealth: Payer: Self-pay | Admitting: Oncology

## 2020-08-20 LAB — CA 125: Cancer Antigen (CA) 125: 19.1 U/mL (ref 0.0–38.1)

## 2020-08-20 NOTE — Telephone Encounter (Signed)
[name] called and asked about her CA 125 results.  Advised her that it is stable per [name].  She verbalized understanding and agreement.

## 2020-08-30 MED FILL — RUBRACA 300 MG TAB: 300 | 30 days supply | Qty: 60 | Fill #7

## 2020-09-20 ENCOUNTER — Telehealth: Payer: Self-pay | Admitting: Oncology

## 2020-09-20 NOTE — Telephone Encounter (Signed)
[name] called and said she noticed bruising on both of her arms and one of her legs on Friday.  She has a large bruise on her right arm that is about 2 inches wide.  She has not done anything that would cause the bruising.  She will send a picture via MyChart and is wondering if her lab appointment for 12/22 should be moved up.

## 2020-09-20 NOTE — Telephone Encounter (Signed)
Called [name] back and advised her of instructions per [name].  She already took [place] this morning but will hold the evening and morning dose until she sees [name] tomorrow. Advised her of appointments for lab/flush tomorrow and to see [name] at 2:20.  She verbalized understanding and agreement of appointments and instructions.

## 2020-09-20 NOTE — Telephone Encounter (Signed)
HOLD her chemo pill today Bring all her meds with her including over the counter meds I can see her tomorrow at 220 pm, 30 mins with labs and flsuh

## 2020-09-21 ENCOUNTER — Inpatient Hospital Stay: Payer: Medicare HMO | Admitting: Hematology and Oncology

## 2020-09-21 ENCOUNTER — Inpatient Hospital Stay: Payer: Medicare HMO

## 2020-09-21 ENCOUNTER — Other Ambulatory Visit: Payer: Self-pay

## 2020-09-21 ENCOUNTER — Inpatient Hospital Stay: Payer: Medicare HMO | Attending: Gynecologic Oncology

## 2020-09-21 DIAGNOSIS — I10 Essential (primary) hypertension: Secondary | ICD-10-CM

## 2020-09-21 DIAGNOSIS — T148XXA Other injury of unspecified body region, initial encounter: Secondary | ICD-10-CM | POA: Diagnosis not present

## 2020-09-21 DIAGNOSIS — T451X5A Adverse effect of antineoplastic and immunosuppressive drugs, initial encounter: Secondary | ICD-10-CM

## 2020-09-21 DIAGNOSIS — D6481 Anemia due to antineoplastic chemotherapy: Secondary | ICD-10-CM

## 2020-09-21 DIAGNOSIS — C562 Malignant neoplasm of left ovary: Secondary | ICD-10-CM

## 2020-09-21 DIAGNOSIS — Z79899 Other long term (current) drug therapy: Secondary | ICD-10-CM | POA: Insufficient documentation

## 2020-09-21 LAB — COMPREHENSIVE METABOLIC PANEL
ALT: 20 U/L (ref 0–44)
AST: 23 U/L (ref 15–41)
Albumin: 3.6 g/dL (ref 3.5–5.0)
Alkaline Phosphatase: 49 U/L (ref 38–126)
Anion gap: 8 (ref 5–15)
BUN: 18 mg/dL (ref 8–23)
CO2: 24 mmol/L (ref 22–32)
Calcium: 9.3 mg/dL (ref 8.9–10.3)
Chloride: 107 mmol/L (ref 98–111)
Creatinine, Ser: 0.81 mg/dL (ref 0.44–1.00)
GFR, Estimated: >60 mL/min (ref >60)
Glucose, Bld: 85 mg/dL (ref 70–99)
Potassium: 4.3 mmol/L (ref 3.5–5.1)
Sodium: 139 mmol/L (ref 135–145)
Total Bilirubin: 0.6 mg/dL (ref 0.3–1.2)
Total Protein: 6.5 g/dL (ref 6.5–8.1)

## 2020-09-21 LAB — CBC WITH DIFFERENTIAL/PLATELET
Abs Immature Granulocytes: 0.03 10*3/uL (ref 0.00–0.07)
Basophils Absolute: 0.1 10*3/uL (ref 0.0–0.1)
Basophils Relative: 1 %
Eosinophils Absolute: 0.1 10*3/uL (ref 0.0–0.5)
Eosinophils Relative: 1 %
HCT: 33.6 % — ABNORMAL LOW (ref 36.0–46.0)
Hemoglobin: 11.2 g/dL — ABNORMAL LOW (ref 12.0–15.0)
Immature Granulocytes: 1 %
Lymphocytes Relative: 29 %
Lymphs Abs: 1.9 10*3/uL (ref 0.7–4.0)
MCH: 33.9 pg (ref 26.0–34.0)
MCHC: 33.3 g/dL (ref 30.0–36.0)
MCV: 101.8 fL — ABNORMAL HIGH (ref 80.0–100.0)
Monocytes Absolute: 0.4 10*3/uL (ref 0.1–1.0)
Monocytes Relative: 6 %
Neutro Abs: 4 10*3/uL (ref 1.7–7.7)
Neutrophils Relative %: 62 %
Platelets: 173 10*3/uL (ref 150–400)
RBC: 3.3 MIL/uL — ABNORMAL LOW (ref 3.87–5.11)
RDW: 15.1 % (ref 11.5–15.5)
WBC: 6.4 10*3/uL (ref 4.0–10.5)
nRBC: 0 % (ref 0.0–0.2)

## 2020-09-21 MED ORDER — HEPARIN SOD (PORK) LOCK FLUSH 100 UNIT/ML IV SOLN
250.0000 [IU] | Freq: Once | INTRAVENOUS | Status: AC
  Administered 2020-09-21: 500 [IU]
  Filled 2020-09-21: qty 5

## 2020-09-21 MED ORDER — SODIUM CHLORIDE 0.9% FLUSH
10.0000 mL | Freq: Once | INTRAVENOUS | Status: AC
  Administered 2020-09-21: 10 mL
  Filled 2020-09-21: qty 10

## 2020-09-21 NOTE — Patient Instructions (Signed)

## 2020-09-22 ENCOUNTER — Telehealth: Payer: Self-pay

## 2020-09-22 ENCOUNTER — Encounter: Payer: Self-pay | Admitting: Hematology and Oncology

## 2020-09-22 DIAGNOSIS — T148XXA Other injury of unspecified body region, initial encounter: Secondary | ICD-10-CM | POA: Insufficient documentation

## 2020-09-22 HISTORY — DX: Other injury of unspecified body region, initial encounter: T14.8XXA

## 2020-09-22 LAB — CA 125: Cancer Antigen (CA) 125: 12.7 U/mL (ref 0.0–38.1)

## 2020-09-22 NOTE — Assessment & Plan Note (Signed)
Her significant skin bruising is due to frequent BC powder use according to electronic search, each dose of BC powder contains approximately 850 mg of aspirin I recommend the patient to stop using BC powder for treatment of chronic headache

## 2020-09-22 NOTE — Telephone Encounter (Signed)
-----   Message from Heath Lark, MD sent at 09/22/2020  9:41 AM EST ----- Regarding: Let her know CA-125 is normal for her at her baseline

## 2020-09-22 NOTE — Assessment & Plan Note (Signed)
This is likely due to recent treatment. The patient denies recent history of bleeding such as epistaxis, hematuria or hematochezia. She is asymptomatic from the anemia. I will observe for now.   

## 2020-09-22 NOTE — Progress Notes (Signed)
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[facility]) I told the patient her skin bruising is unrelated to her treatment she will continue treatment as prescribed her recent tumor marker is satisfactory CT imaging that was done recently showed no evidence of disease she is reassured  Bruising Her significant skin bruising is due to frequent BC powder use according to electronic search, each dose of BC powder contains approximately 850 mg of aspirin I recommend the patient to stop using BC powder for treatment of chronic headache  Essential hypertension Her blood pressure is elevated today could be exacerbated by anxiety observe only for now  Anemia due to antineoplastic chemotherapy This is likely due to recent treatment. The patient denies recent history of bleeding such as epistaxis, hematuria or hematochezia. She is asymptomatic from the anemia. I will observe for now.     No orders of the defined types were placed in this encounter.   All questions were answered. The patient knows to call the clinic with any problems, questions or concerns. The total time spent in the appointment was 20 minutes encounter with patients including review of chart and various tests results, discussions about plan of care and coordination of care plan   Heath Lark, MD 09/22/2020 8:23 AM  INTERVAL HISTORY: Please see below for problem oriented charting. 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 - TUMOR INVOLVES OVARY SURFACE AND LEFT FALLOPIAN TUBE. - SEE ONCOLOGY TABLE. 2. Mesentery, small bowel mesentery biopsy #1 - BENIGN FIBROADIPOSE TISSUE. 3. Ovary and fallopian tube, right - BENIGN OVARY WITH INCLUSION CYSTS. - BENIGN FALLOPIAN TUBE WITH PARATUBAL CYSTS AND ADENOFIBROMA. 4. Omentum, resection for tumor - BENIGN ADIPOSE TISSUE. 5. Peritoneum, biopsy, left diaphragmatic - BENIGN PERITONEAL TYPE TISSUE. 6. Peritoneum, biopsy, right diaphragmatic - BENIGN PERITONEAL TYPE TISSUE. 7. Mesentery, small bowel mesenteric biopsy #2 - BENIGN FIBROADIPOSE TISSUE. 8. Lymph nodes, regional resection, right pelvic - FIVE OF FIVE LYMPH NODES NEGATIVE FOR CARCINOMA (0/5). 9. Lymph nodes, regional resection, left pelvic - FOUR OF FOUR LYMPH NODES NEGATIVE FOR CARCINOMA (0/4). 10. Peritoneum, biopsy, left gutter - BENIGN PERITONEAL TYPE TISSUE. 11. Peritoneum, biopsy, bladder - BENIGN PERITONEAL TYPE TISSUE WITH ACUTE INFLAMMATION AND CALCIFICATIONS.  12. Peritoneum, biopsy, cul-de-sac - BENIGN PERITONEAL TYPE TISSUE WITH ACUTE INFLAMMATION AND CALCIFICATIONS. 13. Soft tissue, biopsy, right gutter - BENIGN FIBROADIPOSE TISSUE. 14. Lymph node, biopsy, right para-aortic - TWO OF TWO LYMPH NODES NEGATIVE FOR CARCINOMA (0/2). 15. Lymph node, biopsy, left para-aortic - ONE  OF ONE LYMPH NODES NEGATIVE FOR CARCINOMA (0/1). Microscopic Comment 1. OVARY Specimen(s): Left ovary and fallopian  tube. Procedure: (including lymph node sampling): Bilateral salpingo-oophorectomy with omental and peritoneal biopsies and lymph node biopsies. Primary tumor site (including laterality): Left ovary. Ovarian surface involvement: Present. Ovarian capsule intact without fragmentation: Intact. Maximum tumor size (cm): 11 cm total. Histologic type: Serous cystadenocarcinoma. Grade: High grade (low grade areas also present). Peritoneal implants: (specify invasive or non-invasive): N/A. Pelvic extension (list additional structures on separate lines and if involved): Left fallopian tube. Lymph nodes: number examined 12 ; number positive 0 TNM code: pT2a, pN0, pMX FIGO Stage (based on pathologic findings, needs clinical correlation): IIA Comments: None.   01/31/2018 Pathology Results   PERITONEAL WASHING (SPECIMEN 1 OF 1 COLLECTED 01/31/18): MALIGNANT CELLS PRESENT CONSISTENT WITH METASTATIC CARCINOMA.   01/31/2018 Surgery   Procedure(s) Performed:  1. Robotic BSO and washings. 2. Exploratory laparotomy, Staging including infragastric omentectomy, Bilateral pelvic and paraaortic lymphadenectomy, pertioneal biopsies.  Specimens: Bilateral tubes / ovaries, bilateral pelvic and paraaortic lymph nodes, peritoneal biopsies, washings and omentum.  Operative Findings: The left adnexa was adherent to the left pelvic peritoneum suspected due to the inferior retraction from the vaginal hysterectomy. Intraoperative leakage of cystic fluid from left ovary.  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  The right adnexa grossly appeared normal although slightly enlarged for her age.  There were some indurated lymph nodes but no overtly malignant lymph nodes were suspected.  Small bowel mesentery had 3-4 superficial possible implants.  1 of these was sent for frozen section returned benign.  She did have adhesive disease from her open cholecystectomy in the right upper  quadrant.  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 16 mm exophytic lesion upper pole right kidney has attenuation too high to be a simple cyst. This may be a cyst complicated by proteinaceous debris or hemorrhage, but renal cell carcinoma is a concern. Routine outpatient follow-up MRI of the abdomen without and with contrast recommended after resolution of patient's acute symptoms (so she is better able to participate with positioning and breath holding). 2. Mild edema/possible minimal hemorrhage in the extraperitoneal soft tissues of the pelvic for tracking up both pelvic sidewalls. Imaging appearance is not outside of the spectrum of findings expected 6 days after the reported surgery. Trace interloop mesenteric and free fluid is identified in the peritoneal cavity in there is some mild edema in the omentum. There is no organized or rim enhancing collection to suggest evolving abscess. 3. Gas in the extraperitoneal soft tissues of the left lower quadrant is associated with gas in the subcutaneous fat of the lower left anterior abdominal wall. This is not unexpected on postoperative day 6. No evidence for intraperitoneal free air. 4. No CT features to suggest small bowel obstruction. Oral contrast material has migrated about halfway through the small bowel loops but there is no differential distention of proximal versus distal small bowel. The colon is diffusely distended and fluid-filled proximally, but formed stool is noted in the distal colon. Given apparent slow  migration of contrast and fluid-filled  small and large bowel loops, a component of ileus is possible.   02/14/2018 Imaging   MR abdomen Benign Bosniak category 2 cyst in upper pole of right kidney, corresponding with lesion seen on previous CT. No evidence of renal neoplasm or other significant abnormality.    02/18/2018 Cancer Staging   Staging form: Ovary, Fallopian Tube, and Primary Peritoneal Carcinoma, AJCC 8th Edition - Pathologic: Stage II (pT2, pN0, cM0) - Signed by Heath Lark, MD on 02/18/2018   02/28/2018 Procedure   Status post right IJ port catheter placement. 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 For last cycle, taxol was omitted   07/18/2018 Imaging   1. No evidence for residual or recurrent tumor within the abdomen or pelvis. There has been interval resolution edema and fluid within the peritoneal cavity. 2. New pulmonary nodule is identified within the right lower lobe measuring 1.1 cm, image 87/4. Consider one of the following in 3 months for both low-risk and high-risk individuals: (a) repeat chest CT, (b) follow-up PET-CT, or (c) tissue sampling. This recommendation follows the consensus statement: Guidelines for Management of Incidental Pulmonary Nodules Detected on CT Images: From the Fleischner Society 2017; Radiology 2017; 284:228-243. 3. Stable 7 mm nodule within the right upper lobe. 4. Aortic Atherosclerosis (ICD10-I70.0). 5. Multi vessel coronary artery atherosclerotic calcifications.   07/18/2018 Tumor Marker   Patient's tumor was tested for the following markers: CA-125 Results of the tumor marker test revealed 14.9   07/25/2018 PET scan   1. Regressing right lower lobe peripheral lung lesion, likely resolving inflammatory or atelectatic process. No  hypermetabolism to suggest malignancy. I would recommend a follow-up noncontrast chest CT and 4-6 months. Small upper lobe pulmonary nodules are stable. 2. 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   07/31/2018 -  Chemotherapy   The patient started taking rucaparib   10/25/2018 Tumor Marker   Patient's tumor was tested for the following markers: CA-125 Results of the tumor marker test revealed 12.1   11/25/2018 Imaging   1. Right lower lobe nodule has resolved. Additional right lung nodules are stable. 2. Stable hazy nodularity along the left external iliac chain.  3. Hepatomegaly. 4. Aortic atherosclerosis (ICD10-170.0). Coronary artery calcification. 5. Enlarged pulmonic trunk, indicative of pulmonary arterial hypertension.   01/16/2019 Tumor Marker   Patient's tumor was tested for the following markers: CA-125 Results of the tumor marker test revealed 13.1   05/22/2019 Tumor Marker   Patient's tumor was tested for the following markers: CA-125 Results of the tumor marker test revealed 14.4   06/24/2019 Imaging   Ct chest, abdomen and pelvis 1. There are multiple clustered tiny centrilobular nodules and irregular opacities of the bilateral lower lobes and lingula, increased compared to prior examination, with underlying bandlike scarring of the medial right middle lobe and right lung base (e.g. series 4, image 100, 110, 123). 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  2. Stable 6 mm pulmonary nodule of the right upper lobe (series 4, image 45). Attention on follow-up.   3. Status post hysterectomy and oophorectomy. No evidence of recurrent or metastatic disease in the abdomen or  pelvis.   4. There are occasional sigmoid diverticula and focal fat stranding about a diverticulum of the mid sigmoid (series 2, image 103). Correlate for signs and  symptoms of acute diverticulitis.   5. Other chronic, incidental, and postoperative findings as detailed above.   06/24/2019 Tumor Marker   Patient's tumor was tested for the following markers: CA-125 Results of the tumor marker test revealed 14.4.   08/19/2019 Tumor Marker   Patient's tumor was tested for the following markers: CA-125 Results of the tumor marker test revealed 10.3   09/25/2019 Imaging   CT chest 1. Interval resolution of previously demonstrated clustered centrilobular nodularity at both lung bases consistent with resolved inflammation. Stable 6 mm right upper lobe nodule consistent with a benign finding. No new or enlarging pulmonary nodules.  2. Stable chronic bronchiectasis and scarring in the right middle lobe and lingula. 3. No evidence of metastatic disease. 4. Aortic Atherosclerosis (ICD10-I70.0).   10/30/2019 Tumor Marker   Patient's tumor was tested for the following markers: CA-125 Results of the tumor marker test revealed 12.1   12/25/2019 Tumor Marker   Patient's tumor was tested for the following markers: CA-125. Results of the tumor marker test revealed 14.8.   02/19/2020 Tumor Marker   Patient's tumor was tested for the following markers: CA-125. Results of the tumor marker test revealed 15.5   04/22/2020 Tumor Marker   Patient's tumor was tested for the following markers: CA-125 Results of the tumor marker test revealed 8.9   06/16/2020 Tumor Marker   Patient's tumor was tested for the following markers: CA-125 Results of the tumor marker test revealed 12.5   06/23/2020 Imaging   1. Stable exam. No new or progressive findings to suggest recurrent or metastatic disease. 2. Aortic Atherosclerosis (ICD10-I70.0).   08/19/2020 Tumor Marker   Patient's tumor was tested for the following markers: CA-125 Results of the tumor marker test revealed 19.1     REVIEW OF SYSTEMS:   Constitutional: Denies fevers, chills or abnormal weight loss Eyes: Denies  blurriness of vision Ears, nose, mouth, throat, and face: Denies mucositis or sore throat Respiratory: Denies cough, dyspnea or wheezes Cardiovascular: Denies palpitation, chest discomfort or lower extremity swelling Gastrointestinal:  Denies nausea, heartburn or change in bowel habits Skin: Denies abnormal skin rashes Lymphatics: Denies new lymphadenopathy  Neurological:Denies numbness, tingling or new weaknesses Behavioral/Psych: Mood is stable, no new changes  All other systems were reviewed with the patient and are negative.  I have reviewed the past medical history, past surgical history, social history and family history with the patient and they are unchanged from previous note.  ALLERGIES:  is allergic to penicillins.  MEDICATIONS:  Current Outpatient Medications  Medication Sig Dispense Refill  . ALPRAZolam (XANAX) 0.5 MG tablet Take 0.5 mg by mouth at bedtime as needed.    Marland Kitchen aspirin 325 MG tablet Take 325 mg by mouth daily.    Marland Kitchen atorvastatin (LIPITOR) 20 MG tablet Take 10 mg by mouth daily.    Marland Kitchen docusate sodium (COLACE) 100 MG capsule Take 100 mg by mouth 2 (two) times daily.    Marland Kitchen FLUoxetine (PROZAC) 20 MG capsule Take 20 mg by mouth daily after breakfast.    . glimepiride (AMARYL) 1 MG tablet Take 0.5 mg by mouth daily as needed. If over 280 blood sugar    . albuterol (VENTOLIN HFA) 108 (90 Base) MCG/ACT inhaler Inhale 2 puffs into the lungs every 6 (six) hours as needed for wheezing or shortness of breath. 6.7  g 6  . amLODipine (NORVASC) 5 MG tablet Take 5 mg by mouth daily.    . Cyanocobalamin (B-12 COMPLIANCE INJECTION) 1000 MCG/ML KIT Inject 1,000 mcg as directed every 30 (thirty) days.    Marland Kitchen gabapentin (NEURONTIN) 300 MG capsule Take 1 capsule (300 mg total) by mouth 2 (two) times daily. 60 capsule 11  . hydrochlorothiazide (HYDRODIURIL) 25 MG tablet Take 25 mg by mouth daily.    Marland Kitchen HYDROcodone-acetaminophen (NORCO) 10-325 MG tablet Take 1 tablet by mouth every 6 (six)  hours as needed. 60 tablet 0  . levothyroxine (SYNTHROID) 112 MCG tablet Take 1 tablet (112 mcg total) by mouth daily before breakfast.    . lidocaine-prilocaine (EMLA) cream Apply to affected area once 30 g 3  . linaclotide (LINZESS) 145 MCG CAPS capsule Take 145 mcg by mouth daily before breakfast.    . losartan (COZAAR) 100 MG tablet Take 100 mg by mouth at bedtime.     . Melatonin 5 MG TABS Take 5-10 mg by mouth at bedtime as needed (sleep).     . metFORMIN (GLUCOPHAGE-XR) 500 MG 24 hr tablet Take 1,000 mg by mouth 2 (two) times daily.     . RUBRACA 300 MG tablet TAKE 1 TABLET (300 MG TOTAL) BY MOUTH 2 (TWO) TIMES DAILY. 60 tablet 11   No current facility-administered medications for this visit.    PHYSICAL EXAMINATION: ECOG PERFORMANCE STATUS: 1 - Symptomatic but completely ambulatory  Vitals:   09/21/20 1432  BP: (!) 159/55  Pulse: 60  Resp: 18  Temp: 98.4 F (36.9 C)  SpO2: 100%   Filed Weights   09/21/20 1432  Weight: 162 lb 12.8 oz (73.8 kg)    GENERAL:alert, no distress and comfortable SKIN: Noted multiple skin bruises.  No petechiae rash EYES: normal, Conjunctiva are pink and non-injected, sclera clear OROPHARYNX:no exudate, no erythema and lips, buccal mucosa, and tongue normal  NECK: supple, thyroid normal size, non-tender, without nodularity LYMPH:  no palpable lymphadenopathy in the cervical, axillary or inguinal LUNGS: clear to auscultation and percussion with normal breathing effort HEART: regular rate & rhythm and no murmurs and no lower extremity edema ABDOMEN:abdomen soft, non-tender and normal bowel sounds Musculoskeletal:no cyanosis of digits and no clubbing  NEURO: alert & oriented x 3 with fluent speech, no focal motor/sensory deficits  LABORATORY DATA:  I have reviewed the data as listed    Component Value Date/Time   NA 139 09/21/2020 1400   K 4.3 09/21/2020 1400   CL 107 09/21/2020 1400   CO2 24 09/21/2020 1400   GLUCOSE 85 09/21/2020 1400    BUN 18 09/21/2020 1400   CREATININE 0.81 09/21/2020 1400   CREATININE 0.93 02/13/2019 1024   CALCIUM 9.3 09/21/2020 1400   PROT 6.5 09/21/2020 1400   ALBUMIN 3.6 09/21/2020 1400   AST 23 09/21/2020 1400   AST 26 02/13/2019 1024   ALT 20 09/21/2020 1400   ALT 23 02/13/2019 1024   ALKPHOS 49 09/21/2020 1400   BILITOT 0.6 09/21/2020 1400   BILITOT 0.8 02/13/2019 1024   GFRNONAA >60 09/21/2020 1400   GFRNONAA 59 (L) 02/13/2019 1024   GFRAA >60 06/16/2020 0918   GFRAA >60 02/13/2019 1024    No results found for: SPEP, UPEP  Lab Results  Component Value Date   WBC 6.4 09/21/2020   NEUTROABS 4.0 09/21/2020   HGB 11.2 (L) 09/21/2020   HCT 33.6 (L) 09/21/2020   MCV 101.8 (H) 09/21/2020   PLT 173 09/21/2020  Chemistry      Component Value Date/Time   NA 139 09/21/2020 1400   K 4.3 09/21/2020 1400   CL 107 09/21/2020 1400   CO2 24 09/21/2020 1400   BUN 18 09/21/2020 1400   CREATININE 0.81 09/21/2020 1400   CREATININE 0.93 02/13/2019 1024      Component Value Date/Time   CALCIUM 9.3 09/21/2020 1400   ALKPHOS 49 09/21/2020 1400   AST 23 09/21/2020 1400   AST 26 02/13/2019 1024   ALT 20 09/21/2020 1400   ALT 23 02/13/2019 1024   BILITOT 0.6 09/21/2020 1400   BILITOT 0.8 02/13/2019 1024

## 2020-09-22 NOTE — Assessment & Plan Note (Signed)
Her blood pressure is elevated today could be exacerbated by anxiety observe only for now

## 2020-09-22 NOTE — Assessment & Plan Note (Signed)
I told the patient her skin bruising is unrelated to her treatment she will continue treatment as prescribed her recent tumor marker is satisfactory CT imaging that was done recently showed no evidence of disease she is reassured

## 2020-09-22 NOTE — Telephone Encounter (Signed)
Called and given below message. She verbalized understanding. 

## 2020-09-27 MED FILL — RUBRACA 300 MG TAB: 300 | 30 days supply | Qty: 60 | Fill #8

## 2020-10-06 ENCOUNTER — Inpatient Hospital Stay: Payer: Medicare HMO

## 2020-10-25 MED FILL — RUBRACA 300 MG TAB: 300 | 30 days supply | Qty: 60 | Fill #9

## 2020-10-27 ENCOUNTER — Telehealth: Payer: Self-pay | Admitting: Oncology

## 2020-10-27 NOTE — Telephone Encounter (Addendum)
[name] called and said she saw her PCP, Dr. Bea Graff this morning and he is recommending that she get a 4th Covid booster because of her cancer treatment.  She is wondering what Dr. Calton Dach thoughts are about a 4th dose.  She had her 3rd dose about 5 months ago.

## 2020-10-27 NOTE — Telephone Encounter (Signed)
Called [name] and advised her of message below from [name].  She verbalized understanding and agreement.

## 2020-10-27 NOTE — Telephone Encounter (Signed)
I am not aware of the need for 4th injection I have not recommended any of my patients to get that

## 2020-11-17 ENCOUNTER — Inpatient Hospital Stay: Payer: Medicare HMO

## 2020-11-17 ENCOUNTER — Inpatient Hospital Stay: Payer: Medicare HMO | Attending: Gynecologic Oncology | Admitting: Hematology and Oncology

## 2020-11-17 ENCOUNTER — Other Ambulatory Visit: Payer: Self-pay | Admitting: Hematology and Oncology

## 2020-11-17 ENCOUNTER — Encounter: Payer: Self-pay | Admitting: Hematology and Oncology

## 2020-11-17 ENCOUNTER — Other Ambulatory Visit: Payer: Self-pay

## 2020-11-17 DIAGNOSIS — Z7984 Long term (current) use of oral hypoglycemic drugs: Secondary | ICD-10-CM | POA: Diagnosis not present

## 2020-11-17 DIAGNOSIS — C562 Malignant neoplasm of left ovary: Secondary | ICD-10-CM

## 2020-11-17 DIAGNOSIS — Z79899 Other long term (current) drug therapy: Secondary | ICD-10-CM | POA: Diagnosis not present

## 2020-11-17 DIAGNOSIS — D649 Anemia, unspecified: Secondary | ICD-10-CM | POA: Diagnosis not present

## 2020-11-17 DIAGNOSIS — K5909 Other constipation: Secondary | ICD-10-CM | POA: Insufficient documentation

## 2020-11-17 DIAGNOSIS — E538 Deficiency of other specified B group vitamins: Secondary | ICD-10-CM | POA: Insufficient documentation

## 2020-11-17 DIAGNOSIS — Z452 Encounter for adjustment and management of vascular access device: Secondary | ICD-10-CM | POA: Diagnosis present

## 2020-11-17 LAB — CBC WITH DIFFERENTIAL/PLATELET
Abs Immature Granulocytes: 0.03 10*3/uL (ref 0.00–0.07)
Basophils Absolute: 0.1 10*3/uL (ref 0.0–0.1)
Basophils Relative: 1 %
Eosinophils Absolute: 0.1 10*3/uL (ref 0.0–0.5)
Eosinophils Relative: 2 %
HCT: 37.7 % (ref 36.0–46.0)
Hemoglobin: 12.5 g/dL (ref 12.0–15.0)
Immature Granulocytes: 1 %
Lymphocytes Relative: 33 %
Lymphs Abs: 2.1 10*3/uL (ref 0.7–4.0)
MCH: 33.5 pg (ref 26.0–34.0)
MCHC: 33.2 g/dL (ref 30.0–36.0)
MCV: 101.1 fL — ABNORMAL HIGH (ref 80.0–100.0)
Monocytes Absolute: 0.4 10*3/uL (ref 0.1–1.0)
Monocytes Relative: 7 %
Neutro Abs: 3.8 10*3/uL (ref 1.7–7.7)
Neutrophils Relative %: 56 %
Platelets: 203 10*3/uL (ref 150–400)
RBC: 3.73 MIL/uL — ABNORMAL LOW (ref 3.87–5.11)
RDW: 13.5 % (ref 11.5–15.5)
WBC: 6.6 10*3/uL (ref 4.0–10.5)
nRBC: 0 % (ref 0.0–0.2)

## 2020-11-17 LAB — COMPREHENSIVE METABOLIC PANEL
ALT: 21 U/L (ref 0–44)
AST: 26 U/L (ref 15–41)
Albumin: 4 g/dL (ref 3.5–5.0)
Alkaline Phosphatase: 56 U/L (ref 38–126)
Anion gap: 8 (ref 5–15)
BUN: 20 mg/dL (ref 8–23)
CO2: 24 mmol/L (ref 22–32)
Calcium: 9.9 mg/dL (ref 8.9–10.3)
Chloride: 108 mmol/L (ref 98–111)
Creatinine, Ser: 0.9 mg/dL (ref 0.44–1.00)
GFR, Estimated: >60 mL/min (ref >60)
Glucose, Bld: 116 mg/dL — ABNORMAL HIGH (ref 70–99)
Potassium: 4.2 mmol/L (ref 3.5–5.1)
Sodium: 140 mmol/L (ref 135–145)
Total Bilirubin: 0.8 mg/dL (ref 0.3–1.2)
Total Protein: 7 g/dL (ref 6.5–8.1)

## 2020-11-17 MED ORDER — HEPARIN SOD (PORK) LOCK FLUSH 100 UNIT/ML IV SOLN
500.0000 [IU] | Freq: Once | INTRAVENOUS | Status: AC
  Administered 2020-11-17: 500 [IU]
  Filled 2020-11-17: qty 5

## 2020-11-17 MED ORDER — SODIUM CHLORIDE 0.9% FLUSH
10.0000 mL | Freq: Once | INTRAVENOUS | Status: AC
Start: 2020-11-17 — End: 2020-11-17
  Administered 2020-11-17: 10 mL
  Filled 2020-11-17: qty 10

## 2020-11-17 NOTE — Patient Instructions (Signed)

## 2020-11-17 NOTE — Assessment & Plan Note (Signed)
She has history of chronic constipation She is taking Linzess We discussed the risk and benefits of pursuing colonoscopy There is no contraindication for her to proceed while on treatment

## 2020-11-17 NOTE — Assessment & Plan Note (Signed)
CT imaging that was done recently showed no evidence of disease Clinically, she has no signs or symptoms of cancer recurrence She tolerated Rubraca well without side effects We will call her with results of tumor marker I plan to see her again in 8 weeks for further follow-up 

## 2020-11-17 NOTE — Progress Notes (Signed)
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[facility]) CT imaging that was done recently showed no evidence of disease Clinically, she has no signs or symptoms of cancer recurrence She tolerated Rubraca well without side effects We will call her with results of tumor marker I plan to see her again in 8 weeks for further follow-up  Vitamin B12 deficiency She was recently found to have vitamin B12 deficiency She is receiving vitamin B12 injection Her anemia has improved She will continue the same  Other constipation She has history of chronic constipation She is taking Linzess We discussed the risk and benefits of pursuing colonoscopy There is no contraindication for her to proceed while on treatment   No orders of the defined types were placed in this encounter.   All questions were answered. The patient knows to call the clinic with any problems, questions or concerns. The total time spent in the appointment was 20 minutes encounter with patients including review of chart and various tests results, discussions about plan of care and coordination of care plan   Heath Lark, MD 11/17/2020 11:03 AM  INTERVAL HISTORY: Please see below for problem oriented charting. 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 - TUMOR INVOLVES OVARY SURFACE AND LEFT FALLOPIAN TUBE. - SEE ONCOLOGY TABLE. 2. Mesentery, small bowel mesentery biopsy #1 - BENIGN FIBROADIPOSE TISSUE. 3. Ovary and fallopian tube, right - BENIGN OVARY WITH INCLUSION CYSTS. - BENIGN FALLOPIAN TUBE WITH PARATUBAL CYSTS AND ADENOFIBROMA. 4. Omentum, resection for tumor - BENIGN ADIPOSE TISSUE. 5. Peritoneum, biopsy, left diaphragmatic - BENIGN PERITONEAL TYPE TISSUE. 6. Peritoneum, biopsy, right diaphragmatic - BENIGN PERITONEAL TYPE TISSUE. 7. Mesentery, small bowel mesenteric biopsy #2 - BENIGN FIBROADIPOSE TISSUE. 8. Lymph nodes, regional resection, right pelvic - FIVE OF FIVE LYMPH NODES NEGATIVE FOR CARCINOMA (0/5). 9. Lymph nodes, regional resection, left pelvic - FOUR OF FOUR LYMPH NODES NEGATIVE FOR CARCINOMA (0/4). 10. Peritoneum, biopsy, left gutter - BENIGN PERITONEAL TYPE TISSUE. 11. Peritoneum, biopsy, bladder - BENIGN PERITONEAL TYPE TISSUE WITH ACUTE INFLAMMATION AND CALCIFICATIONS.  12. Peritoneum, biopsy, cul-de-sac - BENIGN PERITONEAL TYPE TISSUE WITH ACUTE INFLAMMATION AND CALCIFICATIONS. 13. Soft tissue, biopsy, right gutter - BENIGN FIBROADIPOSE TISSUE. 14. Lymph node, biopsy, right para-aortic - TWO OF TWO LYMPH NODES NEGATIVE FOR CARCINOMA (0/2). 15. Lymph node, biopsy, left para-aortic - ONE OF ONE LYMPH NODES NEGATIVE FOR CARCINOMA (0/1). Microscopic Comment 1. OVARY Specimen(s): Left ovary and fallopian tube. Procedure: (including lymph node sampling): Bilateral salpingo-oophorectomy with omental and peritoneal biopsies and lymph node biopsies. Primary tumor site (including laterality): Left ovary. Ovarian surface involvement: Present. Ovarian capsule intact without fragmentation: Intact. Maximum tumor size (cm): 11 cm total. Histologic type: Serous  cystadenocarcinoma. Grade: High grade (  low grade areas also present). Peritoneal implants: (specify invasive or non-invasive): N/A. Pelvic extension (list additional structures on separate lines and if involved): Left fallopian tube. Lymph nodes: number examined 12 ; number positive 0 TNM code: pT2a, pN0, pMX FIGO Stage (based on pathologic findings, needs clinical correlation): IIA Comments: None.   01/31/2018 Pathology Results   PERITONEAL WASHING (SPECIMEN 1 OF 1 COLLECTED 01/31/18): MALIGNANT CELLS PRESENT CONSISTENT WITH METASTATIC CARCINOMA.   01/31/2018 Surgery   Procedure(s) Performed:  1. Robotic BSO and washings. 2. Exploratory laparotomy, Staging including infragastric omentectomy, Bilateral pelvic and paraaortic lymphadenectomy, pertioneal biopsies.  Specimens: Bilateral tubes / ovaries, bilateral pelvic and paraaortic lymph nodes, peritoneal biopsies, washings and omentum.  Operative Findings: The left adnexa was adherent to the left pelvic peritoneum suspected due to the inferior retraction from the vaginal hysterectomy. Intraoperative leakage of cystic fluid from left ovary.  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  The right adnexa grossly appeared normal although slightly enlarged for her age.  There were some indurated lymph nodes but no overtly malignant lymph nodes were suspected.  Small bowel mesentery had 3-4 superficial possible implants.  1 of these was sent for frozen section returned benign.  She did have adhesive disease from her open cholecystectomy in the right upper quadrant.  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 16 mm exophytic lesion upper pole right kidney has attenuation too high to be a simple cyst. This may be a cyst complicated by proteinaceous debris or hemorrhage, but renal cell carcinoma is a concern. Routine outpatient follow-up MRI of the abdomen without and with contrast recommended after resolution of patient's acute symptoms (so she is better able to participate with positioning and breath holding). 2. Mild edema/possible minimal hemorrhage in the extraperitoneal soft tissues of the pelvic for tracking up both pelvic sidewalls. Imaging appearance is not outside of the spectrum of findings expected 6 days after the reported surgery. Trace interloop mesenteric and free fluid is identified in the peritoneal cavity in there is some mild edema in the omentum. There is no organized or rim enhancing collection to suggest evolving abscess. 3. Gas in the extraperitoneal soft tissues of the left lower quadrant is associated with gas in the subcutaneous fat of the lower left anterior abdominal wall. This is not unexpected on postoperative day 6. No evidence for intraperitoneal free air. 4. No CT features to suggest small bowel obstruction. Oral contrast material has migrated about halfway through the small bowel loops but there is no differential distention of proximal versus distal small bowel. The colon is diffusely distended and fluid-filled proximally, but formed stool is noted in the distal colon. Given apparent slow migration of contrast and fluid-filled small and large bowel loops, a component of ileus is possible.   02/14/2018 Imaging   MR abdomen Benign Bosniak category 2 cyst in upper pole of right kidney, corresponding with lesion seen on previous CT. No evidence of renal neoplasm or other significant abnormality.    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 For last cycle, taxol was omitted   07/18/2018 Imaging   1. No evidence for residual or recurrent tumor within the abdomen or pelvis. There has been interval resolution edema and fluid within the peritoneal cavity. 2. New pulmonary nodule is identified within the right lower lobe measuring 1.1 cm, image 87/4. Consider one of the following in 3 months for both low-risk and high-risk individuals: (a) repeat chest CT, (b) follow-up PET-CT, or (c) tissue sampling. This recommendation follows the consensus statement: Guidelines for Management of Incidental Pulmonary Nodules Detected on CT Images: From the Fleischner Society 2017; Radiology 2017; 284:228-243. 3. Stable 7 mm nodule within the right upper lobe. 4. Aortic Atherosclerosis (ICD10-I70.0). 5. Multi vessel coronary artery atherosclerotic calcifications.   07/18/2018 Tumor Marker   Patient's tumor was tested for the following markers: CA-125 Results of the tumor marker test revealed 14.9   07/25/2018 PET scan   1. Regressing right lower lobe peripheral lung lesion, likely resolving inflammatory or atelectatic process. No hypermetabolism to suggest malignancy. I would recommend a follow-up noncontrast chest CT and 4-6 months. Small upper lobe pulmonary nodules are stable. 2. 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   07/31/2018 -  Chemotherapy   The patient started taking  rucaparib   10/25/2018 Tumor Marker   Patient's tumor was tested for the following markers: CA-125 Results of the tumor marker test revealed 12.1   11/25/2018 Imaging   1. Right lower lobe nodule has resolved. Additional right lung nodules are stable. 2. Stable hazy nodularity along the left external iliac chain.  3. Hepatomegaly. 4. Aortic atherosclerosis (ICD10-170.0). Coronary artery calcification. 5. Enlarged pulmonic trunk, indicative of pulmonary arterial hypertension.   01/16/2019 Tumor Marker   Patient's tumor was tested for the following markers: CA-125 Results of the tumor marker test revealed 13.1   05/22/2019 Tumor Marker   Patient's tumor was tested for the following markers: CA-125 Results of the tumor marker test revealed 14.4   06/24/2019 Imaging   Ct chest, abdomen and pelvis 1. There are multiple clustered tiny centrilobular nodules and irregular opacities of the bilateral lower lobes and lingula, increased compared to prior examination, with underlying bandlike scarring of the medial right middle lobe and right lung base (e.g. series 4, image 100, 110, 123). 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  2. Stable 6 mm pulmonary nodule of the right upper lobe (series 4, image 45). Attention on follow-up.   3. Status post hysterectomy and oophorectomy. No evidence of recurrent or metastatic disease in the abdomen or pelvis.   4. There are occasional sigmoid diverticula and focal fat stranding about a diverticulum of the mid sigmoid (series 2, image 103). Correlate for signs and symptoms of acute diverticulitis.   5. Other chronic, incidental, and postoperative findings as detailed above.   06/24/2019 Tumor Marker   Patient's tumor was tested for the following markers: CA-125 Results of the tumor marker test revealed  14.4.   08/19/2019 Tumor Marker   Patient's tumor was tested for the following markers: CA-125 Results of the  tumor marker test revealed 10.3   09/25/2019 Imaging   CT chest 1. Interval resolution of previously demonstrated clustered centrilobular nodularity at both lung bases consistent with resolved inflammation. Stable 6 mm right upper lobe nodule consistent with a benign finding. No new or enlarging pulmonary nodules.  2. Stable chronic bronchiectasis and scarring in the right middle lobe and lingula. 3. No evidence of metastatic disease. 4. Aortic Atherosclerosis (ICD10-I70.0).   10/30/2019 Tumor Marker   Patient's tumor was tested for the following markers: CA-125 Results of the tumor marker test revealed 12.1   12/25/2019 Tumor Marker   Patient's tumor was tested for the following markers: CA-125. Results of the tumor marker test revealed 14.8.   02/19/2020 Tumor Marker   Patient's tumor was tested for the following markers: CA-125. Results of the tumor marker test revealed 15.5   04/22/2020 Tumor Marker   Patient's tumor was tested for the following markers: CA-125 Results of the tumor marker test revealed 8.9   06/16/2020 Tumor Marker   Patient's tumor was tested for the following markers: CA-125 Results of the tumor marker test revealed 12.5   06/23/2020 Imaging   1. Stable exam. No new or progressive findings to suggest recurrent or metastatic disease. 2. Aortic Atherosclerosis (ICD10-I70.0).   08/19/2020 Tumor Marker   Patient's tumor was tested for the following markers: CA-125 Results of the tumor marker test revealed 19.1   09/21/2020 Tumor Marker   Patient's tumor was tested for the following markers: CA-125 Results of the tumor marker test revealed 12.7.     REVIEW OF SYSTEMS:   Constitutional: Denies fevers, chills or abnormal weight loss Eyes: Denies blurriness of vision Ears, nose, mouth, throat, and face: Denies mucositis or sore throat Respiratory: Denies cough, dyspnea or wheezes Cardiovascular: Denies palpitation, chest discomfort or lower extremity  swelling Gastrointestinal:  Denies nausea, heartburn or change in bowel habits Skin: Denies abnormal skin rashes Lymphatics: Denies new lymphadenopathy or easy bruising Neurological:Denies numbness, tingling or new weaknesses Behavioral/Psych: Mood is stable, no new changes  All other systems were reviewed with the patient and are negative.  I have reviewed the past medical history, past surgical history, social history and family history with the patient and they are unchanged from previous note.  ALLERGIES:  is allergic to penicillins.  MEDICATIONS:  Current Outpatient Medications  Medication Sig Dispense Refill  . ALPRAZolam (XANAX) 0.5 MG tablet Take 0.5 mg by mouth at bedtime as needed.    Marland Kitchen amLODipine (NORVASC) 5 MG tablet Take 5 mg by mouth daily.    Marland Kitchen atorvastatin (LIPITOR) 20 MG tablet Take 10 mg by mouth daily.    . Cyanocobalamin (B-12 COMPLIANCE INJECTION) 1000 MCG/ML KIT Inject 1,000 mcg as directed every 30 (thirty) days.    Marland Kitchen docusate sodium (COLACE) 100 MG capsule Take 100 mg by mouth 2 (two) times daily.    Marland Kitchen FLUoxetine (PROZAC) 20 MG capsule Take 20 mg by mouth daily after breakfast.    . gabapentin (NEURONTIN) 300 MG capsule Take 1 capsule (300 mg total) by mouth 2 (two) times daily. 60 capsule 11  . hydrochlorothiazide (HYDRODIURIL) 25 MG tablet Take 25 mg by mouth daily.    Marland Kitchen HYDROcodone-acetaminophen (NORCO) 10-325 MG tablet Take 1 tablet by mouth every 6 (six) hours as needed. 60 tablet 0  . levothyroxine (SYNTHROID) 112 MCG tablet Take 1 tablet (112 mcg total)  by mouth daily before breakfast.    . lidocaine-prilocaine (EMLA) cream Apply to affected area once 30 g 3  . linaclotide (LINZESS) 145 MCG CAPS capsule Take 145 mcg by mouth daily before breakfast.    . losartan (COZAAR) 100 MG tablet Take 100 mg by mouth at bedtime.     . Melatonin 5 MG TABS Take 5-10 mg by mouth at bedtime as needed (sleep).     . metFORMIN (GLUCOPHAGE-XR) 500 MG 24 hr tablet Take 1,000  mg by mouth 2 (two) times daily.     . RUBRACA 300 MG tablet TAKE 1 TABLET (300 MG TOTAL) BY MOUTH 2 (TWO) TIMES DAILY. 60 tablet 11   No current facility-administered medications for this visit.    PHYSICAL EXAMINATION: ECOG PERFORMANCE STATUS: 1 - Symptomatic but completely ambulatory  Vitals:   11/17/20 0911  BP: (!) 141/70  Pulse: 78  Resp: 18  Temp: (!) 97.2 F (36.2 C)  SpO2: 100%   Filed Weights   11/17/20 0911  Weight: 161 lb 12.8 oz (73.4 kg)    GENERAL:alert, no distress and comfortable SKIN: skin color, texture, turgor are normal, no rashes or significant lesions EYES: normal, Conjunctiva are pink and non-injected, sclera clear OROPHARYNX:no exudate, no erythema and lips, buccal mucosa, and tongue normal  NECK: supple, thyroid normal size, non-tender, without nodularity LYMPH:  no palpable lymphadenopathy in the cervical, axillary or inguinal LUNGS: clear to auscultation and percussion with normal breathing effort HEART: regular rate & rhythm and no murmurs and no lower extremity edema ABDOMEN:abdomen soft, non-tender and normal bowel sounds Musculoskeletal:no cyanosis of digits and no clubbing  NEURO: alert & oriented x 3 with fluent speech, no focal motor/sensory deficits  LABORATORY DATA:  I have reviewed the data as listed    Component Value Date/Time   NA 140 11/17/2020 0900   K 4.2 11/17/2020 0900   CL 108 11/17/2020 0900   CO2 24 11/17/2020 0900   GLUCOSE 116 (H) 11/17/2020 0900   BUN 20 11/17/2020 0900   CREATININE 0.90 11/17/2020 0900   CREATININE 0.93 02/13/2019 1024   CALCIUM 9.9 11/17/2020 0900   PROT 7.0 11/17/2020 0900   ALBUMIN 4.0 11/17/2020 0900   AST 26 11/17/2020 0900   AST 26 02/13/2019 1024   ALT 21 11/17/2020 0900   ALT 23 02/13/2019 1024   ALKPHOS 56 11/17/2020 0900   BILITOT 0.8 11/17/2020 0900   BILITOT 0.8 02/13/2019 1024   GFRNONAA >60 11/17/2020 0900   GFRNONAA 59 (L) 02/13/2019 1024   GFRAA >60 06/16/2020 0918   GFRAA  >60 02/13/2019 1024    No results found for: SPEP, UPEP  Lab Results  Component Value Date   WBC 6.6 11/17/2020   NEUTROABS 3.8 11/17/2020   HGB 12.5 11/17/2020   HCT 37.7 11/17/2020   MCV 101.1 (H) 11/17/2020   PLT 203 11/17/2020      Chemistry      Component Value Date/Time   NA 140 11/17/2020 0900   K 4.2 11/17/2020 0900   CL 108 11/17/2020 0900   CO2 24 11/17/2020 0900   BUN 20 11/17/2020 0900   CREATININE 0.90 11/17/2020 0900   CREATININE 0.93 02/13/2019 1024      Component Value Date/Time   CALCIUM 9.9 11/17/2020 0900   ALKPHOS 56 11/17/2020 0900   AST 26 11/17/2020 0900   AST 26 02/13/2019 1024   ALT 21 11/17/2020 0900   ALT 23 02/13/2019 1024   BILITOT 0.8 11/17/2020 0900  BILITOT 0.8 02/13/2019 1024

## 2020-11-17 NOTE — Assessment & Plan Note (Signed)
She was recently found to have vitamin B12 deficiency She is receiving vitamin B12 injection Her anemia has improved She will continue the same

## 2020-11-18 ENCOUNTER — Telehealth: Payer: Self-pay

## 2020-11-18 LAB — CA 125: Cancer Antigen (CA) 125: 14.7 U/mL (ref 0.0–38.1)

## 2020-11-18 NOTE — Telephone Encounter (Signed)
Called and given below message. She verbalized understanding. 

## 2020-11-18 NOTE — Telephone Encounter (Signed)
-----   Message from Heath Lark, MD sent at 11/18/2020  9:58 AM EST ----- Regarding: pls let her know CA-125 is good

## 2020-11-24 MED FILL — RUBRACA 300 MG TAB: 300 | 30 days supply | Qty: 60 | Fill #10

## 2021-01-05 ENCOUNTER — Telehealth: Payer: Self-pay | Admitting: Oncology

## 2021-01-05 NOTE — Telephone Encounter (Signed)
[name] and moved her appointments to 3/29 from 3/30 per [name].  Advised her of new apt times: lab at 10:15, flush at 10:30 and follow up with Dr Alvy Bimler at 11:25.  She verbalized understanding and agreement.

## 2021-01-11 ENCOUNTER — Inpatient Hospital Stay: Payer: Medicare HMO | Admitting: Hematology and Oncology

## 2021-01-11 ENCOUNTER — Inpatient Hospital Stay: Payer: Medicare HMO

## 2021-01-11 ENCOUNTER — Inpatient Hospital Stay: Payer: Medicare HMO | Attending: Gynecologic Oncology

## 2021-01-11 ENCOUNTER — Other Ambulatory Visit: Payer: Self-pay

## 2021-01-11 ENCOUNTER — Encounter: Payer: Self-pay | Admitting: Hematology and Oncology

## 2021-01-11 DIAGNOSIS — G620 Drug-induced polyneuropathy: Secondary | ICD-10-CM | POA: Diagnosis not present

## 2021-01-11 DIAGNOSIS — Z7984 Long term (current) use of oral hypoglycemic drugs: Secondary | ICD-10-CM | POA: Diagnosis not present

## 2021-01-11 DIAGNOSIS — Z8543 Personal history of malignant neoplasm of ovary: Secondary | ICD-10-CM | POA: Diagnosis present

## 2021-01-11 DIAGNOSIS — Z90722 Acquired absence of ovaries, bilateral: Secondary | ICD-10-CM | POA: Insufficient documentation

## 2021-01-11 DIAGNOSIS — M25512 Pain in left shoulder: Secondary | ICD-10-CM | POA: Diagnosis not present

## 2021-01-11 DIAGNOSIS — Z452 Encounter for adjustment and management of vascular access device: Secondary | ICD-10-CM | POA: Diagnosis not present

## 2021-01-11 DIAGNOSIS — Z791 Long term (current) use of non-steroidal anti-inflammatories (NSAID): Secondary | ICD-10-CM | POA: Diagnosis not present

## 2021-01-11 DIAGNOSIS — Z9079 Acquired absence of other genital organ(s): Secondary | ICD-10-CM | POA: Insufficient documentation

## 2021-01-11 DIAGNOSIS — Z9221 Personal history of antineoplastic chemotherapy: Secondary | ICD-10-CM | POA: Insufficient documentation

## 2021-01-11 DIAGNOSIS — Z79899 Other long term (current) drug therapy: Secondary | ICD-10-CM | POA: Diagnosis not present

## 2021-01-11 DIAGNOSIS — T451X5A Adverse effect of antineoplastic and immunosuppressive drugs, initial encounter: Secondary | ICD-10-CM | POA: Diagnosis not present

## 2021-01-11 DIAGNOSIS — C562 Malignant neoplasm of left ovary: Secondary | ICD-10-CM

## 2021-01-11 DIAGNOSIS — G8929 Other chronic pain: Secondary | ICD-10-CM

## 2021-01-11 HISTORY — DX: Other chronic pain: G89.29

## 2021-01-11 LAB — CBC WITH DIFFERENTIAL/PLATELET
Abs Immature Granulocytes: 0.15 10*3/uL — ABNORMAL HIGH (ref 0.00–0.07)
Basophils Absolute: 0.1 10*3/uL (ref 0.0–0.1)
Basophils Relative: 1 %
Eosinophils Absolute: 0.1 10*3/uL (ref 0.0–0.5)
Eosinophils Relative: 1 %
HCT: 38.2 % (ref 36.0–46.0)
Hemoglobin: 12.7 g/dL (ref 12.0–15.0)
Immature Granulocytes: 2 %
Lymphocytes Relative: 39 %
Lymphs Abs: 3.9 10*3/uL (ref 0.7–4.0)
MCH: 33.1 pg (ref 26.0–34.0)
MCHC: 33.2 g/dL (ref 30.0–36.0)
MCV: 99.5 fL (ref 80.0–100.0)
Monocytes Absolute: 0.6 10*3/uL (ref 0.1–1.0)
Monocytes Relative: 6 %
Neutro Abs: 5.1 10*3/uL (ref 1.7–7.7)
Neutrophils Relative %: 51 %
Platelets: 202 10*3/uL (ref 150–400)
RBC: 3.84 MIL/uL — ABNORMAL LOW (ref 3.87–5.11)
RDW: 14.2 % (ref 11.5–15.5)
WBC: 10 10*3/uL (ref 4.0–10.5)
nRBC: 0 % (ref 0.0–0.2)

## 2021-01-11 LAB — COMPREHENSIVE METABOLIC PANEL
ALT: 26 U/L (ref 0–44)
AST: 24 U/L (ref 15–41)
Albumin: 4 g/dL (ref 3.5–5.0)
Alkaline Phosphatase: 62 U/L (ref 38–126)
Anion gap: 9 (ref 5–15)
BUN: 26 mg/dL — ABNORMAL HIGH (ref 8–23)
CO2: 21 mmol/L — ABNORMAL LOW (ref 22–32)
Calcium: 9.2 mg/dL (ref 8.9–10.3)
Chloride: 108 mmol/L (ref 98–111)
Creatinine, Ser: 0.78 mg/dL (ref 0.44–1.00)
GFR, Estimated: >60 mL/min (ref >60)
Glucose, Bld: 110 mg/dL — ABNORMAL HIGH (ref 70–99)
Potassium: 4.6 mmol/L (ref 3.5–5.1)
Sodium: 138 mmol/L (ref 135–145)
Total Bilirubin: 0.5 mg/dL (ref 0.3–1.2)
Total Protein: 6.7 g/dL (ref 6.5–8.1)

## 2021-01-11 MED ORDER — SODIUM CHLORIDE 0.9% FLUSH
10.0000 mL | Freq: Once | INTRAVENOUS | Status: AC
  Administered 2021-01-11: 10 mL
  Filled 2021-01-11: qty 10

## 2021-01-11 MED ORDER — HEPARIN SOD (PORK) LOCK FLUSH 100 UNIT/ML IV SOLN
500.0000 [IU] | Freq: Once | INTRAVENOUS | Status: AC
Start: 2021-01-11 — End: 2021-01-11
  Administered 2021-01-11: 500 [IU]
  Filled 2021-01-11: qty 5

## 2021-01-11 NOTE — Patient Instructions (Signed)
Implanted Port Insertion, Care After This sheet gives you information about how to care for yourself after your procedure. Your health care provider may also give you more specific instructions. If you have problems or questions, contact your health care provider. What can I expect after the procedure? After the procedure, it is common to have:  Discomfort at the port insertion site.  Bruising on the skin over the port. This should improve over 3-4 days. Follow these instructions at home: Port care  After your port is placed, you will get a manufacturer's information card. The card has information about your port. Keep this card with you at all times.  Take care of the port as told by your health care provider. Ask your health care provider if you or a family member can get training for taking care of the port at home. A home health care nurse may also take care of the port.  Make sure to remember what type of port you have. Incision care  Follow instructions from your health care provider about how to take care of your port insertion site. Make sure you: ? Wash your hands with soap and water before and after you change your bandage (dressing). If soap and water are not available, use hand sanitizer. ? Change your dressing as told by your health care provider. ? Leave stitches (sutures), skin glue, or adhesive strips in place. These skin closures may need to stay in place for 2 weeks or longer. If adhesive strip edges start to loosen and curl up, you may trim the loose edges. Do not remove adhesive strips completely unless your health care provider tells you to do that.  Check your port insertion site every day for signs of infection. Check for: ? Redness, swelling, or pain. ? Fluid or blood. ? Warmth. ? Pus or a bad smell.      Activity  Return to your normal activities as told by your health care provider. Ask your health care provider what activities are safe for you.  Do not  lift anything that is heavier than 10 lb (4.5 kg), or the limit that you are told, until your health care provider says that it is safe. General instructions  Take over-the-counter and prescription medicines only as told by your health care provider.  Do not take baths, swim, or use a hot tub until your health care provider approves. Ask your health care provider if you may take showers. You may only be allowed to take sponge baths.  Do not drive for 24 hours if you were given a sedative during your procedure.  Wear a medical alert bracelet in case of an emergency. This will tell any health care providers that you have a port.  Keep all follow-up visits as told by your health care provider. This is important. Contact a health care provider if:  You cannot flush your port with saline as directed, or you cannot draw blood from the port.  You have a fever or chills.  You have redness, swelling, or pain around your port insertion site.  You have fluid or blood coming from your port insertion site.  Your port insertion site feels warm to the touch.  You have pus or a bad smell coming from the port insertion site. Get help right away if:  You have chest pain or shortness of breath.  You have bleeding from your port that you cannot control. Summary  Take care of the port as told by your   health care provider. Keep the manufacturer's information card with you at all times.  Change your dressing as told by your health care provider.  Contact a health care provider if you have a fever or chills or if you have redness, swelling, or pain around your port insertion site.  Keep all follow-up visits as told by your health care provider. This information is not intended to replace advice given to you by your health care provider. Make sure you discuss any questions you have with your health care provider. Document Revised: 04/30/2018 Document Reviewed: 04/30/2018 Elsevier Patient Education   2021 [employer]  

## 2021-01-11 NOTE — Assessment & Plan Note (Signed)
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

## 2021-01-11 NOTE — Assessment & Plan Note (Signed)
She had recent flare of acute left shoulder pain This is likely degenerative in nature and not related to her treatment Observe closely

## 2021-01-11 NOTE — Progress Notes (Signed)
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   All questions were answered. The patient knows to call the clinic with any problems, questions or concerns. The total time spent in the appointment was 20 minutes encounter with patients including review of chart and various tests results, discussions about plan of care and coordination of care plan   Heath Lark, MD 01/11/2021 12:40 PM  INTERVAL HISTORY: Please see below for problem oriented charting. 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 - TUMOR  INVOLVES OVARY SURFACE AND LEFT FALLOPIAN TUBE. - SEE ONCOLOGY TABLE. 2. Mesentery, small bowel mesentery biopsy #1 - BENIGN FIBROADIPOSE TISSUE. 3. Ovary and fallopian tube, right - BENIGN OVARY WITH INCLUSION CYSTS. - BENIGN FALLOPIAN TUBE WITH PARATUBAL CYSTS AND ADENOFIBROMA. 4. Omentum, resection for tumor - BENIGN ADIPOSE TISSUE. 5. Peritoneum, biopsy, left diaphragmatic - BENIGN PERITONEAL TYPE TISSUE. 6. Peritoneum, biopsy, right diaphragmatic - BENIGN PERITONEAL TYPE TISSUE. 7. Mesentery, small bowel mesenteric biopsy #2 - BENIGN FIBROADIPOSE TISSUE. 8. Lymph nodes, regional resection, right pelvic - FIVE OF FIVE LYMPH NODES NEGATIVE FOR CARCINOMA (0/5). 9. Lymph nodes, regional resection, left pelvic - FOUR OF FOUR LYMPH NODES NEGATIVE FOR CARCINOMA (0/4). 10. Peritoneum, biopsy, left gutter - BENIGN PERITONEAL TYPE TISSUE. 11. Peritoneum, biopsy, bladder - BENIGN PERITONEAL TYPE TISSUE WITH ACUTE INFLAMMATION AND CALCIFICATIONS.  12. Peritoneum, biopsy, cul-de-sac - BENIGN PERITONEAL TYPE TISSUE WITH ACUTE INFLAMMATION AND CALCIFICATIONS. 13. Soft tissue, biopsy, right gutter - BENIGN FIBROADIPOSE TISSUE. 14. Lymph node, biopsy, right para-aortic - TWO OF TWO LYMPH NODES NEGATIVE FOR CARCINOMA (0/2). 15. Lymph node, biopsy, left para-aortic - ONE OF ONE LYMPH NODES NEGATIVE FOR CARCINOMA (0/1). Microscopic Comment 1. OVARY Specimen(s): Left ovary and fallopian tube. Procedure: (including lymph node sampling): Bilateral salpingo-oophorectomy with omental and peritoneal biopsies and lymph node biopsies. Primary tumor site (including laterality): Left ovary. Ovarian surface involvement: Present. Ovarian capsule intact without fragmentation: Intact. Maximum tumor size (cm): 11 cm total. Histologic type: Serous cystadenocarcinoma. Grade: High grade (low grade areas also present). Peritoneal implants: (specify invasive or non-invasive): N/A. Pelvic extension (list  additional structures on separate lines and if  involved): Left fallopian tube. Lymph nodes: number examined 12 ; number positive 0 TNM code: pT2a, pN0, pMX FIGO Stage (based on pathologic findings, needs clinical correlation): IIA Comments: None.   01/31/2018 Pathology Results   PERITONEAL WASHING (SPECIMEN 1 OF 1 COLLECTED 01/31/18): MALIGNANT CELLS PRESENT CONSISTENT WITH METASTATIC CARCINOMA.   01/31/2018 Surgery   Procedure(s) Performed:  1. Robotic BSO and washings. 2. Exploratory laparotomy, Staging including infragastric omentectomy, Bilateral pelvic and paraaortic lymphadenectomy, pertioneal biopsies.  Specimens: Bilateral tubes / ovaries, bilateral pelvic and paraaortic lymph nodes, peritoneal biopsies, washings and omentum.  Operative Findings: The left adnexa was adherent to the left pelvic peritoneum suspected due to the inferior retraction from the vaginal hysterectomy. Intraoperative leakage of cystic fluid from left ovary.  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  The right adnexa grossly appeared normal although slightly enlarged for her age.  There were some indurated lymph nodes but no overtly malignant lymph nodes were suspected.  Small bowel mesentery had 3-4 superficial possible implants.  1 of these was sent for frozen section returned benign.  She did have adhesive disease from her open cholecystectomy in the right upper quadrant.  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 16 mm exophytic lesion upper pole right kidney has  attenuation too high to be a simple cyst. This may be a cyst complicated by proteinaceous debris or hemorrhage, but renal cell carcinoma is a concern. Routine outpatient follow-up MRI of the abdomen without and with contrast recommended after resolution of patient's acute symptoms (so she is better able to participate with positioning and breath holding). 2. Mild edema/possible minimal hemorrhage in the extraperitoneal soft tissues of the pelvic for tracking up both pelvic sidewalls. Imaging appearance is not outside of the spectrum of findings expected 6 days after the reported surgery. Trace interloop mesenteric and free fluid is identified in the peritoneal cavity in there is some mild edema in the omentum. There is no organized or rim enhancing collection to suggest evolving abscess. 3. Gas in the extraperitoneal soft tissues of the left lower quadrant is associated with gas in the subcutaneous fat of the lower left anterior abdominal wall. This is not unexpected on postoperative day 6. No evidence for intraperitoneal free air. 4. No CT features to suggest small bowel obstruction. Oral contrast material has migrated about halfway through the small bowel loops but there is no differential distention of proximal versus distal small bowel. The colon is diffusely distended and fluid-filled proximally, but formed stool is noted in the distal colon. Given apparent slow migration of contrast and fluid-filled small and large bowel loops, a component of ileus is possible.   02/14/2018 Imaging   MR abdomen Benign Bosniak category 2 cyst in upper pole of right kidney, corresponding with lesion seen on previous CT. No evidence of renal neoplasm or other significant abnormality.    02/18/2018 Cancer Staging   Staging form: Ovary, Fallopian Tube, and Primary Peritoneal Carcinoma, AJCC 8th Edition - Pathologic: Stage II (pT2, pN0, cM0) - Signed by Heath Lark, MD on 02/18/2018  02/28/2018 Procedure   Status post  right IJ port catheter placement. 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 For last cycle, taxol was omitted   07/18/2018 Imaging   1. No evidence for residual or recurrent tumor within the abdomen or pelvis. There has been interval resolution edema and fluid within the peritoneal cavity. 2. New pulmonary nodule is identified within the right lower lobe measuring 1.1 cm, image 87/4. Consider one of the following in 3 months for both low-risk and high-risk individuals: (a) repeat chest CT, (b) follow-up PET-CT, or (c) tissue sampling. This recommendation follows the consensus statement: Guidelines for Management of Incidental Pulmonary Nodules Detected on CT Images: From the Fleischner Society 2017; Radiology 2017; 284:228-243. 3. Stable 7 mm nodule within the right upper lobe. 4. Aortic Atherosclerosis (ICD10-I70.0). 5. Multi vessel coronary artery atherosclerotic calcifications.   07/18/2018 Tumor Marker   Patient's tumor was tested for the following markers: CA-125 Results of the tumor marker test revealed 14.9   07/25/2018 PET scan   1. Regressing right lower lobe peripheral lung lesion, likely resolving inflammatory or atelectatic process. No hypermetabolism to suggest malignancy. I would recommend a follow-up noncontrast chest CT and 4-6 months. Small upper lobe pulmonary nodules are stable. 2. 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   07/31/2018 -  Chemotherapy   The patient started taking rucaparib   10/25/2018 Tumor Marker   Patient's tumor was tested for the following markers: CA-125 Results of the tumor marker test revealed 12.1   11/25/2018  Imaging   1. Right lower lobe nodule has resolved. Additional right lung nodules are stable. 2. Stable hazy nodularity along the left external iliac chain.  3. Hepatomegaly. 4. Aortic atherosclerosis (ICD10-170.0). Coronary artery calcification. 5. Enlarged pulmonic trunk, indicative of pulmonary arterial hypertension.   01/16/2019 Tumor Marker   Patient's tumor was tested for the following markers: CA-125 Results of the tumor marker test revealed 13.1   05/22/2019 Tumor Marker   Patient's tumor was tested for the following markers: CA-125 Results of the tumor marker test revealed 14.4   06/24/2019 Imaging   Ct chest, abdomen and pelvis 1. There are multiple clustered tiny centrilobular nodules and irregular opacities of the bilateral lower lobes and lingula, increased compared to prior examination, with underlying bandlike scarring of the medial right middle lobe and right lung base (e.g. series 4, image 100, 110, 123). 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  2. Stable 6 mm pulmonary nodule of the right upper lobe (series 4, image 45). Attention on follow-up.   3. Status post hysterectomy and oophorectomy. No evidence of recurrent or metastatic disease in the abdomen or pelvis.   4. There are occasional sigmoid diverticula and focal fat stranding about a diverticulum of the mid sigmoid (series 2, image 103). Correlate for signs and symptoms of acute diverticulitis.   5. Other chronic, incidental, and postoperative findings as detailed above.   06/24/2019 Tumor Marker   Patient's tumor was tested for the following markers: CA-125 Results of the tumor marker test revealed 14.4.   08/19/2019 Tumor Marker   Patient's tumor was tested for the following markers: CA-125 Results of the tumor  marker test revealed 10.3   09/25/2019 Imaging   CT chest 1. Interval resolution of previously demonstrated clustered centrilobular nodularity at both  lung bases consistent with resolved inflammation. Stable 6 mm right upper lobe nodule consistent with a benign finding. No new or enlarging pulmonary nodules.  2. Stable chronic bronchiectasis and scarring in the right middle lobe and lingula. 3. No evidence of metastatic disease. 4. Aortic Atherosclerosis (ICD10-I70.0).   10/30/2019 Tumor Marker   Patient's tumor was tested for the following markers: CA-125 Results of the tumor marker test revealed 12.1   12/25/2019 Tumor Marker   Patient's tumor was tested for the following markers: CA-125. Results of the tumor marker test revealed 14.8.   02/19/2020 Tumor Marker   Patient's tumor was tested for the following markers: CA-125. Results of the tumor marker test revealed 15.5   04/22/2020 Tumor Marker   Patient's tumor was tested for the following markers: CA-125 Results of the tumor marker test revealed 8.9   06/16/2020 Tumor Marker   Patient's tumor was tested for the following markers: CA-125 Results of the tumor marker test revealed 12.5   06/23/2020 Imaging   1. Stable exam. No new or progressive findings to suggest recurrent or metastatic disease. 2. Aortic Atherosclerosis (ICD10-I70.0).   08/19/2020 Tumor Marker   Patient's tumor was tested for the following markers: CA-125 Results of the tumor marker test revealed 19.1   09/21/2020 Tumor Marker   Patient's tumor was tested for the following markers: CA-125 Results of the tumor marker test revealed 12.7.   11/17/2020 Tumor Marker   Patient's tumor was tested for the following markers: CA-125 Results of the tumor marker test revealed 14.7     REVIEW OF SYSTEMS:   Constitutional: Denies fevers, chills or abnormal weight loss Eyes: Denies blurriness of vision Ears, nose, mouth, throat, and face: Denies mucositis or sore throat Respiratory: Denies cough, dyspnea or wheezes Cardiovascular: Denies palpitation, chest discomfort or lower extremity swelling Gastrointestinal:  Denies  nausea, heartburn or change in bowel habits Skin: Denies abnormal skin rashes Lymphatics: Denies new lymphadenopathy or easy bruising Behavioral/Psych: Mood is stable, no new changes  All other systems were reviewed with the patient and are negative.  I have reviewed the past medical history, past surgical history, social history and family history with the patient and they are unchanged from previous note.  ALLERGIES:  is allergic to penicillins.  MEDICATIONS:  Current Outpatient Medications  Medication Sig Dispense Refill  . celecoxib (CELEBREX) 200 MG capsule Take 200 mg by mouth daily.    Marland Kitchen ALPRAZolam (XANAX) 0.5 MG tablet Take 0.5 mg by mouth at bedtime as needed.    Marland Kitchen amLODipine (NORVASC) 5 MG tablet Take 5 mg by mouth daily.    Marland Kitchen atorvastatin (LIPITOR) 20 MG tablet Take 10 mg by mouth daily.    . Cyanocobalamin (B-12 COMPLIANCE INJECTION) 1000 MCG/ML KIT Inject 1,000 mcg as directed every 30 (thirty) days.    Marland Kitchen docusate sodium (COLACE) 100 MG capsule Take 100 mg by mouth 2 (two) times daily.    Marland Kitchen FLUoxetine (PROZAC) 20 MG capsule Take 20 mg by mouth daily after breakfast.    . hydrochlorothiazide (HYDRODIURIL) 25 MG tablet Take 25 mg by mouth daily.    Marland Kitchen HYDROcodone-acetaminophen (NORCO) 10-325 MG tablet Take 1 tablet by mouth every 6 (six) hours as needed. 60 tablet 0  . levothyroxine (SYNTHROID) 112 MCG tablet Take 1 tablet (112 mcg total) by mouth daily before breakfast.    . lidocaine-prilocaine (  EMLA) cream Apply to affected area once 30 g 3  . linaclotide (LINZESS) 145 MCG CAPS capsule Take 145 mcg by mouth daily before breakfast.    . losartan (COZAAR) 100 MG tablet Take 100 mg by mouth at bedtime.     . Melatonin 5 MG TABS Take 5-10 mg by mouth at bedtime as needed (sleep).     . metFORMIN (GLUCOPHAGE-XR) 500 MG 24 hr tablet Take 1,000 mg by mouth 2 (two) times daily.     . RUBRACA 300 MG tablet TAKE 1 TABLET (300 MG TOTAL) BY MOUTH 2 (TWO) TIMES DAILY. 60 tablet 11   No  current facility-administered medications for this visit.    PHYSICAL EXAMINATION: ECOG PERFORMANCE STATUS: 1 - Symptomatic but completely ambulatory  Vitals:   01/11/21 1038  BP: (!) 141/49  Pulse: (!) 58  Resp: 18  Temp: (!) 97 F (36.1 C)  SpO2: 100%   Filed Weights   01/11/21 1038  Weight: 162 lb (73.5 kg)    GENERAL:alert, no distress and comfortable SKIN: skin color, texture, turgor are normal, no rashes or significant lesions EYES: normal, Conjunctiva are pink and non-injected, sclera clear OROPHARYNX:no exudate, no erythema and lips, buccal mucosa, and tongue normal  NECK: supple, thyroid normal size, non-tender, without nodularity LYMPH:  no palpable lymphadenopathy in the cervical, axillary or inguinal LUNGS: clear to auscultation and percussion with normal breathing effort HEART: regular rate & rhythm and no murmurs and no lower extremity edema ABDOMEN:abdomen soft, non-tender and normal bowel sounds Musculoskeletal:no cyanosis of digits and no clubbing  NEURO: alert & oriented x 3 with fluent speech, no focal motor/sensory deficits  LABORATORY DATA:  I have reviewed the data as listed    Component Value Date/Time   NA 138 01/11/2021 1030   K 4.6 01/11/2021 1030   CL 108 01/11/2021 1030   CO2 21 (L) 01/11/2021 1030   GLUCOSE 110 (H) 01/11/2021 1030   BUN 26 (H) 01/11/2021 1030   CREATININE 0.78 01/11/2021 1030   CREATININE 0.93 02/13/2019 1024   CALCIUM 9.2 01/11/2021 1030   PROT 6.7 01/11/2021 1030   ALBUMIN 4.0 01/11/2021 1030   AST 24 01/11/2021 1030   AST 26 02/13/2019 1024   ALT 26 01/11/2021 1030   ALT 23 02/13/2019 1024   ALKPHOS 62 01/11/2021 1030   BILITOT 0.5 01/11/2021 1030   BILITOT 0.8 02/13/2019 1024   GFRNONAA >60 01/11/2021 1030   GFRNONAA 59 (L) 02/13/2019 1024   GFRAA >60 06/16/2020 0918   GFRAA >60 02/13/2019 1024    No results found for: SPEP, UPEP  Lab Results  Component Value Date   WBC 10.0 01/11/2021   NEUTROABS 5.1  01/11/2021   HGB 12.7 01/11/2021   HCT 38.2 01/11/2021   MCV 99.5 01/11/2021   PLT 202 01/11/2021      Chemistry      Component Value Date/Time   NA 138 01/11/2021 1030   K 4.6 01/11/2021 1030   CL 108 01/11/2021 1030   CO2 21 (L) 01/11/2021 1030   BUN 26 (H) 01/11/2021 1030   CREATININE 0.78 01/11/2021 1030   CREATININE 0.93 02/13/2019 1024      Component Value Date/Time   CALCIUM 9.2 01/11/2021 1030   ALKPHOS 62 01/11/2021 1030   AST 24 01/11/2021 1030   AST 26 02/13/2019 1024   ALT 26 01/11/2021 1030   ALT 23 02/13/2019 1024   BILITOT 0.5 01/11/2021 1030   BILITOT 0.8 02/13/2019 1024

## 2021-01-11 NOTE — Assessment & Plan Note (Signed)
CT imaging that was done recently showed no evidence of disease Clinically, she has no signs or symptoms of cancer recurrence She tolerated Rubraca well without side effects We will call her with results of tumor marker I plan to see her again in 8 weeks for further follow-up

## 2021-01-12 ENCOUNTER — Inpatient Hospital Stay: Payer: Medicare HMO

## 2021-01-12 ENCOUNTER — Inpatient Hospital Stay: Payer: Medicare HMO | Admitting: Hematology and Oncology

## 2021-01-12 ENCOUNTER — Telehealth: Payer: Self-pay

## 2021-01-12 LAB — CA 125: Cancer Antigen (CA) 125: 11.6 U/mL (ref 0.0–38.1)

## 2021-01-12 NOTE — Telephone Encounter (Signed)
Called and given below message. She verbalized understanding. 

## 2021-01-12 NOTE — Telephone Encounter (Signed)
-----   Message from Heath Lark, MD sent at 01/12/2021  8:16 AM EDT ----- Pls let her know CA-125 is nromal

## 2021-01-13 ENCOUNTER — Other Ambulatory Visit (HOSPITAL_COMMUNITY): Payer: Self-pay

## 2021-01-17 ENCOUNTER — Other Ambulatory Visit (HOSPITAL_COMMUNITY): Payer: Self-pay

## 2021-01-17 ENCOUNTER — Telehealth: Payer: Self-pay

## 2021-01-17 NOTE — Telephone Encounter (Signed)
Oral Oncology Patient Advocate Encounter  Patient has been approved for copay assistance with [employer] (TAF).  [employer] [facility] will cover all copayment expenses for [place] for the remainder of the calendar year.    The billing information is as follows and has been shared with [place].   Member ID: [id number]  Group ID: [id number] PCN: [id number] BIN: [id number] Eligibility Dates: 01/17/21 to 10/15/21  Fund: [facility] Phone [phone] Fax [phone] 01/17/2021 1:34 PM

## 2021-01-24 ENCOUNTER — Other Ambulatory Visit (HOSPITAL_COMMUNITY): Payer: Self-pay

## 2021-01-24 ENCOUNTER — Other Ambulatory Visit: Payer: Self-pay | Admitting: Hematology and Oncology

## 2021-01-24 DIAGNOSIS — C562 Malignant neoplasm of left ovary: Secondary | ICD-10-CM

## 2021-01-24 MED ORDER — RUBRACA 300 MG PO TABS
ORAL_TABLET | ORAL | 11 refills | Status: DC
  Filled 2021-01-24: qty 60, 30d supply, fill #0
  Filled 2021-01-24: qty 60, fill #0

## 2021-01-25 ENCOUNTER — Other Ambulatory Visit (HOSPITAL_COMMUNITY): Payer: Self-pay

## 2021-01-26 ENCOUNTER — Other Ambulatory Visit (HOSPITAL_COMMUNITY): Payer: Self-pay

## 2021-02-07 ENCOUNTER — Inpatient Hospital Stay (HOSPITAL_COMMUNITY): Admit: 2021-02-07 | Payer: Medicare HMO

## 2021-02-07 ENCOUNTER — Ambulatory Visit (HOSPITAL_COMMUNITY): Admit: 2021-02-07 | Payer: Medicare HMO

## 2021-02-07 ENCOUNTER — Inpatient Hospital Stay (HOSPITAL_COMMUNITY)
Admission: EM | Admit: 2021-02-07 | Discharge: 2021-02-10 | DRG: 519 | Disposition: A | Discharge status: Home / Self Care | Payer: Medicare HMO | Attending: Orthopedic Surgery | Admitting: Orthopedic Surgery

## 2021-02-07 ENCOUNTER — Encounter (HOSPITAL_COMMUNITY): Payer: Self-pay | Admitting: *Deleted

## 2021-02-07 ENCOUNTER — Ambulatory Visit (HOSPITAL_COMMUNITY)
Admit: 2021-02-07 | Discharge: 2021-02-07 | Disposition: A | Discharge status: Home / Self Care | Payer: Medicare HMO | Attending: Emergency Medicine | Admitting: Emergency Medicine

## 2021-02-07 DIAGNOSIS — Z8042 Family history of malignant neoplasm of prostate: Secondary | ICD-10-CM | POA: Diagnosis not present

## 2021-02-07 DIAGNOSIS — R52 Pain, unspecified: Secondary | ICD-10-CM | POA: Diagnosis not present

## 2021-02-07 DIAGNOSIS — Z7984 Long term (current) use of oral hypoglycemic drugs: Secondary | ICD-10-CM

## 2021-02-07 DIAGNOSIS — E119 Type 2 diabetes mellitus without complications: Secondary | ICD-10-CM | POA: Diagnosis present

## 2021-02-07 DIAGNOSIS — M5116 Intervertebral disc disorders with radiculopathy, lumbar region: Principal | ICD-10-CM | POA: Diagnosis present

## 2021-02-07 DIAGNOSIS — Z803 Family history of malignant neoplasm of breast: Secondary | ICD-10-CM

## 2021-02-07 DIAGNOSIS — Z807 Family history of other malignant neoplasms of lymphoid, hematopoietic and related tissues: Secondary | ICD-10-CM

## 2021-02-07 DIAGNOSIS — Z880 Allergy status to penicillin: Secondary | ICD-10-CM | POA: Diagnosis not present

## 2021-02-07 DIAGNOSIS — F32A Depression, unspecified: Secondary | ICD-10-CM | POA: Diagnosis present

## 2021-02-07 DIAGNOSIS — M5417 Radiculopathy, lumbosacral region: Secondary | ICD-10-CM

## 2021-02-07 DIAGNOSIS — Z96652 Presence of left artificial knee joint: Secondary | ICD-10-CM | POA: Diagnosis present

## 2021-02-07 DIAGNOSIS — M8008XA Age-related osteoporosis with current pathological fracture, vertebra(e), initial encounter for fracture: Secondary | ICD-10-CM | POA: Diagnosis not present

## 2021-02-07 DIAGNOSIS — Z801 Family history of malignant neoplasm of trachea, bronchus and lung: Secondary | ICD-10-CM

## 2021-02-07 DIAGNOSIS — K219 Gastro-esophageal reflux disease without esophagitis: Secondary | ICD-10-CM | POA: Diagnosis not present

## 2021-02-07 DIAGNOSIS — I10 Essential (primary) hypertension: Secondary | ICD-10-CM | POA: Diagnosis present

## 2021-02-07 DIAGNOSIS — Z8052 Family history of malignant neoplasm of bladder: Secondary | ICD-10-CM

## 2021-02-07 DIAGNOSIS — K5900 Constipation, unspecified: Secondary | ICD-10-CM | POA: Diagnosis present

## 2021-02-07 DIAGNOSIS — M7061 Trochanteric bursitis, right hip: Secondary | ICD-10-CM | POA: Diagnosis present

## 2021-02-07 DIAGNOSIS — Z79899 Other long term (current) drug therapy: Secondary | ICD-10-CM | POA: Diagnosis not present

## 2021-02-07 DIAGNOSIS — Z79890 Hormone replacement therapy (postmenopausal): Secondary | ICD-10-CM | POA: Diagnosis not present

## 2021-02-07 DIAGNOSIS — Z806 Family history of leukemia: Secondary | ICD-10-CM

## 2021-02-07 DIAGNOSIS — E039 Hypothyroidism, unspecified: Secondary | ICD-10-CM | POA: Diagnosis not present

## 2021-02-07 DIAGNOSIS — Z20822 Contact with and (suspected) exposure to covid-19: Secondary | ICD-10-CM | POA: Diagnosis present

## 2021-02-07 DIAGNOSIS — M25551 Pain in right hip: Secondary | ICD-10-CM | POA: Diagnosis present

## 2021-02-07 DIAGNOSIS — Z800 Family history of malignant neoplasm of digestive organs: Secondary | ICD-10-CM | POA: Diagnosis not present

## 2021-02-07 DIAGNOSIS — Z8543 Personal history of malignant neoplasm of ovary: Secondary | ICD-10-CM | POA: Diagnosis not present

## 2021-02-07 DIAGNOSIS — Z419 Encounter for procedure for purposes other than remedying health state, unspecified: Secondary | ICD-10-CM

## 2021-02-07 DIAGNOSIS — M5126 Other intervertebral disc displacement, lumbar region: Secondary | ICD-10-CM | POA: Diagnosis present

## 2021-02-07 DIAGNOSIS — M1990 Unspecified osteoarthritis, unspecified site: Secondary | ICD-10-CM | POA: Diagnosis not present

## 2021-02-07 HISTORY — DX: Pain in right hip: M25.551

## 2021-02-07 LAB — CBC WITH DIFFERENTIAL/PLATELET
Abs Immature Granulocytes: 0.1 10*3/uL — ABNORMAL HIGH (ref 0.00–0.07)
Basophils Absolute: 0 10*3/uL (ref 0.0–0.1)
Basophils Relative: 0 %
Eosinophils Absolute: 0.1 10*3/uL (ref 0.0–0.5)
Eosinophils Relative: 1 %
HCT: 38.2 % (ref 36.0–46.0)
Hemoglobin: 12.9 g/dL (ref 12.0–15.0)
Immature Granulocytes: 1 %
Lymphocytes Relative: 15 %
Lymphs Abs: 1.3 10*3/uL (ref 0.7–4.0)
MCH: 33.6 pg (ref 26.0–34.0)
MCHC: 33.8 g/dL (ref 30.0–36.0)
MCV: 99.5 fL (ref 80.0–100.0)
Monocytes Absolute: 0.4 10*3/uL (ref 0.1–1.0)
Monocytes Relative: 5 %
Neutro Abs: 6.8 10*3/uL (ref 1.7–7.7)
Neutrophils Relative %: 78 %
Platelets: UNDETERMINED 10*3/uL (ref 150–400)
RBC: 3.84 MIL/uL — ABNORMAL LOW (ref 3.87–5.11)
RDW: 14.7 % (ref 11.5–15.5)
WBC: 8.8 10*3/uL (ref 4.0–10.5)
nRBC: 0 % (ref 0.0–0.2)

## 2021-02-07 LAB — GLUCOSE, CAPILLARY: Glucose-Capillary: 145 mg/dL — ABNORMAL HIGH (ref 70–99)

## 2021-02-07 LAB — COMPREHENSIVE METABOLIC PANEL
ALT: 43 U/L (ref 0–44)
AST: 33 U/L (ref 15–41)
Albumin: 3.4 g/dL — ABNORMAL LOW (ref 3.5–5.0)
Alkaline Phosphatase: 55 U/L (ref 38–126)
Anion gap: 11 (ref 5–15)
BUN: 22 mg/dL (ref 8–23)
CO2: 25 mmol/L (ref 22–32)
Calcium: 9 mg/dL (ref 8.9–10.3)
Chloride: 100 mmol/L (ref 98–111)
Creatinine, Ser: 0.8 mg/dL (ref 0.44–1.00)
GFR, Estimated: >60 mL/min (ref >60)
Glucose, Bld: 131 mg/dL — ABNORMAL HIGH (ref 70–99)
Potassium: 3.8 mmol/L (ref 3.5–5.1)
Sodium: 136 mmol/L (ref 135–145)
Total Bilirubin: 1.3 mg/dL — ABNORMAL HIGH (ref 0.3–1.2)
Total Protein: 6 g/dL — ABNORMAL LOW (ref 6.5–8.1)

## 2021-02-07 LAB — URINALYSIS, ROUTINE W REFLEX MICROSCOPIC
Bilirubin Urine: NEGATIVE
Glucose, UA: NEGATIVE mg/dL
Hgb urine dipstick: NEGATIVE
Ketones, ur: 5 mg/dL — AB
Nitrite: NEGATIVE
Protein, ur: 30 mg/dL — AB
Specific Gravity, Urine: 1.026 (ref 1.005–1.030)
pH: 6 (ref 5.0–8.0)

## 2021-02-07 LAB — HEMOGLOBIN A1C
Hgb A1c MFr Bld: 6.4 % — ABNORMAL HIGH (ref 4.8–5.6)
Mean Plasma Glucose: 136.98 mg/dL

## 2021-02-07 LAB — CBG MONITORING, ED: Glucose-Capillary: 125 mg/dL — ABNORMAL HIGH (ref 70–99)

## 2021-02-07 LAB — PROTIME-INR
INR: 1 (ref 0.8–1.2)
Prothrombin Time: 12.8 seconds (ref 11.4–15.2)

## 2021-02-07 LAB — RESP PANEL BY RT-PCR (FLU A&B, COVID) ARPGX2
Influenza A by PCR: NEGATIVE
Influenza B by PCR: NEGATIVE
SARS Coronavirus 2 by RT PCR: NEGATIVE

## 2021-02-07 MED ORDER — FLUOXETINE HCL 20 MG PO CAPS
20.0000 mg | ORAL_CAPSULE | Freq: Every day | ORAL | Status: DC
  Administered 2021-02-08 – 2021-02-10 (×3): 20 mg via ORAL
  Filled 2021-02-07 (×3): qty 1

## 2021-02-07 MED ORDER — LACTATED RINGERS IV SOLN: INTRAVENOUS | Status: DC

## 2021-02-07 MED ORDER — ALPRAZOLAM 0.5 MG PO TABS
0.5000 mg | ORAL_TABLET | Freq: Every evening | ORAL | Status: DC | PRN
  Administered 2021-02-07 – 2021-02-08 (×2): 0.5 mg via ORAL
  Filled 2021-02-07 (×2): qty 1

## 2021-02-07 MED ORDER — AMLODIPINE BESYLATE 5 MG PO TABS
5.0000 mg | ORAL_TABLET | Freq: Every day | ORAL | Status: DC
  Administered 2021-02-08 – 2021-02-10 (×3): 5 mg via ORAL
  Filled 2021-02-07 (×3): qty 1

## 2021-02-07 MED ORDER — ENOXAPARIN SODIUM 40 MG/0.4ML ~~LOC~~ SOLN
40.0000 mg | Freq: Every day | SUBCUTANEOUS | Status: DC
  Administered 2021-02-07: 40 mg via SUBCUTANEOUS
  Filled 2021-02-07: qty 0.4

## 2021-02-07 MED ORDER — LORAZEPAM 2 MG/ML IJ SOLN: 0.5000 mg | Freq: Once | INTRAMUSCULAR | Status: DC | PRN

## 2021-02-07 MED ORDER — LEVOTHYROXINE SODIUM 112 MCG PO TABS
112.0000 ug | ORAL_TABLET | Freq: Every day | ORAL | Status: DC
  Administered 2021-02-08 – 2021-02-10 (×3): 112 ug via ORAL
  Filled 2021-02-07 (×3): qty 1

## 2021-02-07 MED ORDER — INSULIN ASPART 100 UNIT/ML ~~LOC~~ SOLN
0.0000 [IU] | Freq: Three times a day (TID) | SUBCUTANEOUS | Status: DC
  Administered 2021-02-07 – 2021-02-09 (×3): 1 [IU] via SUBCUTANEOUS
  Filled 2021-02-07: qty 0.09

## 2021-02-07 MED ORDER — HYDROCHLOROTHIAZIDE 25 MG PO TABS
25.0000 mg | ORAL_TABLET | Freq: Every day | ORAL | Status: DC
  Administered 2021-02-08 – 2021-02-10 (×2): 25 mg via ORAL
  Filled 2021-02-07 (×3): qty 1

## 2021-02-07 MED ORDER — ATORVASTATIN CALCIUM 10 MG PO TABS
10.0000 mg | ORAL_TABLET | Freq: Every day | ORAL | Status: DC
  Administered 2021-02-07 – 2021-02-10 (×4): 10 mg via ORAL
  Filled 2021-02-07 (×4): qty 1

## 2021-02-07 MED ORDER — HYDROCODONE-ACETAMINOPHEN 10-325 MG PO TABS
1.0000 | ORAL_TABLET | Freq: Four times a day (QID) | ORAL | Status: DC | PRN
  Administered 2021-02-08 – 2021-02-09 (×2): 1 via ORAL
  Filled 2021-02-07 (×3): qty 1

## 2021-02-07 MED ORDER — HYDROMORPHONE HCL 1 MG/ML IJ SOLN
0.5000 mg | INTRAMUSCULAR | Status: DC | PRN
  Administered 2021-02-07 – 2021-02-09 (×7): 0.5 mg via INTRAVENOUS
  Filled 2021-02-07 (×4): qty 0.5
  Filled 2021-02-07: qty 1
  Filled 2021-02-07: qty 0.5
  Filled 2021-02-07: qty 1

## 2021-02-07 MED ORDER — RUCAPARIB CAMSYLATE 300 MG PO TABS
300.0000 mg | ORAL_TABLET | Freq: Two times a day (BID) | ORAL | Status: DC
  Administered 2021-02-08: 300 mg via ORAL

## 2021-02-07 NOTE — ED Provider Notes (Signed)
[facility] DEPT Provider Note   CSN: [id number] Arrival date & time: 02/07/21  1013     History Chief Complaint  Patient presents with  . Hip Pain    [name] is a 80 y.o. female.  HPI Patient reports that she unloaded mulch 5 days ago.  She reports on the subsequent day she started getting discomfort in her right lower back buttock and hip.  By Friday, the pain was really severe.  [facility] at [facility] where she is a patient with Dr. Tomi Likens.  She reports she was given an injection of Toradol but did not get much pain relief.  Patient reports the only position of comfort is flat on her back in supine position.  She reports she was already taking a Medrol Dosepak that she just finished yesterday.  This was for her left shoulder.  She had also recently gotten a steroid injection in the left shoulder.  She reports she was given some hydrocodone to take for the pain but pain was severe and persisted.  She indicates pain from about her SI joint on the right into the buttock, hip and radiating down to the knee.  She reports its impeding her ability to walk because she has so much pain.  He is using a cane or a walker and able to ambulate with that assistance.  She reports she had severe pain like this in her hip on the left 2 years ago and after getting MRI, ovarian cancer was discovered.  She reports that the symptoms got better after she had surgery.  She reports that the orthopedic provider seeing her was concerned for a possible stress fracture in the hip or that her pain was from her back.  Due to intractable pain, she was sent to the emergency department from [facility] with the expectation of getting admitted and MRIs done.    Past Medical History:  Diagnosis Date  . Arthritis   . Diabetes mellitus without complication ([place])    type 2   . GERD (gastroesophageal reflux disease)   . Hypertension   . Hypothyroidism   . Ovarian cancer  ([place]) 01/31/2018  . Pneumonia    its been 4 years   . PONV (postoperative nausea and vomiting)   . Vitamin B 12 deficiency     Patient Active Problem List   Diagnosis Date Noted  . Chronic left shoulder pain 01/11/2021  . Bruising 09/22/2020  . 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 Weight loss 04/22/2020  . Preventive measure 10/30/2019  . Anemia due to antineoplastic chemotherapy 08/19/2019  . Bronchiectasis without complication ([place]) 80/88/1103  . UTI (urinary tract infection) 05/13/2019  . Goals of care, counseling/discussion 11/26/2018  . Other fatigue 11/11/2018  . Breast pain, right 11/04/2018  . Axillary lymphadenopathy 11/04/2018  . Pancytopenia, acquired ([place]) 10/25/2018  . Chronic back pain greater than 3 months duration 09/24/2018  . 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 Physical debility 07/25/2018  . Multiple lung nodules on CT 07/25/2018  . Genetic testing 06/03/2018  . 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 Chemotherapy adverse reaction 03/11/2018  . Other constipation 03/01/2018  . Elevated liver function tests 02/06/2018  . Malignant neoplasm of left ovary (HCC)   . Obesity (BMI 30-39.9) 12/14/2015  . Acquired hypothyroidism 12/13/2015  . Atherosclerosis 12/13/2015  . Degeneration of lumbar intervertebral disc 12/13/2015  . Diabetes mellitus with peripheral circulatory disorder ([place]) 12/13/2015  . Essential hypertension 12/13/2015  . GERD  without esophagitis 12/13/2015  . High risk medication use 12/13/2015  . Major depressive disorder, recurrent ([place]) 12/13/2015  . Malaise and fatigue 12/13/2015  . Mixed hyperlipidemia 12/13/2015  . Posterior tibial tendonitis 12/13/2015  . Primary insomnia 12/13/2015  . Primary osteoarthritis involving multiple joints 12/13/2015  . Vitamin B12 deficiency 12/13/2015  . 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 BLADDER SUSPENSION  2002  . BREAST  SURGERY     several benign cysts removed; surgeries from [facility]   . CHOLECYSTECTOMY  1988  . EYE SURGERY  2010 amd 2012   cataracts removed bilateral   . IR FLUORO GUIDE PORT INSERTION RIGHT  02/28/2018  . IR US GUIDE VASC ACCESS RIGHT  02/28/2018  . KNEE ARTHROSCOPY Left    l knee x2  . PARATHYROIDECTOMY  2010  . ROBOTIC ASSISTED SALPINGO OOPHERECTOMY Bilateral 01/31/2018   Procedure: XI ROBOTIC ASSISTED BILATERAL SALPINGO OOPHORECTOMY, STAGING, LAPAROTOMY, PELVIC AND PARA AORTIC LYMPH NODE DISSECTION, OMENTECTOMY;  Surgeon: Isabel Caprice, MD;  Location: [facility];  Service: Gynecology;  Laterality: Bilateral;  . ROTATOR CUFF REPAIR  2005   right   . TOTAL KNEE ARTHROPLASTY Left 12/08/2014   Procedure: LEFT TOTAL KNEE ARTHROPLASTY;  Surgeon: Tobi Bastos, MD;  Location: [facility];  Service: Orthopedics;  Laterality: Left;  Marland Kitchen VAGINAL HYSTERECTOMY  1984     OB History   No obstetric history on file.     Family History  Problem Relation Age of Onset  . Lymphoma Mother 48  . Bladder Cancer Sister 59  . Prostate cancer Brother 35       has surgery right away after dx  . Leukemia Brother        14's  . Breast cancer Other 21       niece, GT 2017 reportedly neg  . Prostate cancer Father 11       found on autopsy at death (49)  . Lung cancer Maternal Aunt   . Esophageal cancer Maternal Uncle   . Other Maternal Grandfather 75       cerebral hemmhorage  . Lung cancer Cousin   . Lung cancer Cousin   . Leukemia Cousin     Social History   Tobacco Use  . Smoking status: Never Smoker  . Smokeless tobacco: Never Used  Vaping Use  . Vaping Use: Never used  Substance Use Topics  . Alcohol use: No  . Drug use: No    Home Medications Prior to Admission medications   Medication Sig Start Date End Date Taking? Authorizing Provider  ALPRAZolam Duanne Moron) 0.5 MG tablet Take 0.5 mg by mouth at bedtime as needed.    [provider]  amLODipine (NORVASC) 5 MG tablet Take 5  mg by mouth daily. 01/05/20   [provider]  atorvastatin (LIPITOR) 20 MG tablet Take 10 mg by mouth daily.    [provider]  celecoxib (CELEBREX) 200 MG capsule Take 200 mg by mouth daily.    [provider]  Cyanocobalamin (B-12 COMPLIANCE INJECTION) 1000 MCG/ML KIT Inject 1,000 mcg as directed every 30 (thirty) days.    [provider]  docusate sodium (COLACE) 100 MG capsule Take 100 mg by mouth 2 (two) times daily.    [provider]  FLUoxetine (PROZAC) 20 MG capsule Take 20 mg by mouth daily after breakfast.    [provider]  hydrochlorothiazide (HYDRODIURIL) 25 MG tablet Take 25 mg  by mouth daily.    [provider]  HYDROcodone-acetaminophen (NORCO) 10-325 MG tablet Take 1 tablet by mouth every 6 (six) hours as needed. 03/27/19   Heath Lark, MD  levothyroxine (SYNTHROID) 112 MCG tablet Take 1 tablet (112 mcg total) by mouth daily before breakfast. 02/13/19   Heath Lark, MD  lidocaine-prilocaine (EMLA) cream Apply to affected area once 05/22/19   Heath Lark, MD  linaclotide (LINZESS) 145 MCG CAPS capsule Take 145 mcg by mouth daily before breakfast.    [provider]  losartan (COZAAR) 100 MG tablet Take 100 mg by mouth at bedtime.     [provider]  Melatonin 5 MG TABS Take 5-10 mg by mouth at bedtime as needed (sleep).     [provider]  metFORMIN (GLUCOPHAGE-XR) 500 MG 24 hr tablet Take 1,000 mg by mouth 2 (two) times daily.     [provider]  RUBRACA 300 MG tablet TAKE 1 TABLET (300 MG TOTAL) BY MOUTH 2 (TWO) TIMES DAILY. 01/24/21 01/24/22  Heath Lark, MD    Allergies    Penicillins  Review of Systems   Review of Systems 10 systems reviewed and negative except as per HPI Physical Exam Updated Vital Signs BP 119/83 (BP Location: Left Arm)   Pulse 64   Temp 98.1 F (36.7 C) (Oral)   Resp 20   SpO2 99%   Physical Exam Constitutional:      Comments: Alert nontoxic.   Clinically well in appearance.  No respiratory distress  HENT:     Head: Normocephalic and atraumatic.     Mouth/Throat:     Pharynx: Oropharynx is clear.  Eyes:     Extraocular Movements: Extraocular movements intact.  Cardiovascular:     Rate and Rhythm: Normal rate and regular rhythm.  Pulmonary:     Effort: Pulmonary effort is normal.     Breath sounds: Normal breath sounds.  Abdominal:     General: There is no distension.     Palpations: Abdomen is soft.     Tenderness: There is no abdominal tenderness. There is no guarding.  Musculoskeletal:     Comments: Bilateral lower extremities are symmetric without peripheral edema or deformity.  Dorsalis pedis pulses are 2+ and symmetric.  The feet are warm and dry.  Patient has neurovascularly intact with able to move extremities.  He does endorse producible pain over the SI joint on the right in the lateral hip.  No appreciable soft tissue abnormalities.  Skin:    General: Skin is warm and dry.  Neurological:     General: No focal deficit present.     Mental Status: She is oriented to person, place, and time.     Coordination: Coordination normal.  Psychiatric:        Mood and Affect: Mood normal.     ED Results / Procedures / Treatments   Labs (all labs ordered are listed, but only abnormal results are displayed) Labs Reviewed - No data to display  EKG None  Radiology No results found.  Procedures Procedures   Medications Ordered in ED Medications  HYDROmorphone (DILAUDID) injection 0.5 mg (has no administration in time range)  LORazepam (ATIVAN) injection 0.5 mg (has no administration in time range)  lactated ringers infusion (has no administration in time range)    ED Course  I have reviewed the triage vital signs and the nursing notes.  Pertinent labs & imaging results that were available during my care of the patient were reviewed  by me and considered in my medical decision making (see chart for details).     MDM Rules/Calculators/A&P                         Consult: Dr. Doran Durand for [facility].  Agrees with plan to proceed with MRIs and hospitalist admission for management of intractable pain.   Patient presents as outlined with intractable pain in her SI region and hip radiating to the knee.  She is otherwise alert and well in appearance.  No appearance of acute infectious symptoms or DVT.  Exam patient is neurovascularly intact.  She has excellent distal pulses in the feet are warm and dry without peripheral edema or soft tissue abnormality.  Pain control initiated with Dilaudid as needed.  Patient otherwise has stable vital signs.  Will admit for pain management and diagnostic evaluation per recommendations from orthopedics. Final Clinical Impression(s) / ED Diagnoses Final diagnoses:  Lumbosacral radiculopathy  Right hip pain  Intractable pain    Rx / DC Orders ED Discharge Orders    None       Charlesetta Shanks, MD 02/07/21 1303

## 2021-02-07 NOTE — ED Triage Notes (Addendum)
Pt complains of right hip pain x 2 days. She was lifting mulch the day prior. Pt was seen at [facility], received IM Toradol, had not relief. Saw [facility] again today and had steroid injection and was referred to ED to be admitted.

## 2021-02-07 NOTE — ED Notes (Signed)
Pt assisted to bedside commode

## 2021-02-07 NOTE — H&P (Signed)
History and Physical    [name] LNL:[id number] DOB: 1941/09/23 DOA: 02/07/2021  I have briefly reviewed the patient's prior medical records in [place]  PCP: Raina Mina., MD  Patient coming from: home  Chief Complaint: right hip pain  HPI: [name] is a 80 y.o. female with medical history significant of HTN, hypothyroidism, DM2, history of ovarian cancer, arthritis, comes to the hospital with complaint of right hip right lower back pain.  She unloaded some mulch last week subsequently she has been experiencing worsening right hip pain and is now to the point that she is unable to stand or walk.  Her pain is controlled on when she is laying completely flat but is excruciating with movements.  She was seen in Ortho office this morning, received a injection in her right hip, but due to excruciating pain she was sent for the ED to be admitted for pain control and also obtain an MRI of the hip.  She had an episode just like this few years back imaging actually revealed her ovarian cancer.  She denies any fever or chills, no abdominal pain, no nausea or vomiting.  She is otherwise in her normal state of health  ED Course: In the ED she is afebrile, normotensive, satting well on room air.  Her blood work is fairly unremarkable.  Dr. Doran Durand with orthopedic surgery was consulted and recommending an MRI of the lumbar spine and the right hip which is currently pending.  We were asked to admit for pain control  Review of Systems: All systems reviewed, and apart from HPI, all negative  Past Medical History:  Diagnosis Date  . Arthritis   . Diabetes mellitus without complication ([place])    type 2   . GERD (gastroesophageal reflux disease)   . Hypertension   . Hypothyroidism   . Ovarian cancer ([place]) 01/31/2018  . Pneumonia    its been 4 years   . PONV (postoperative nausea and vomiting)   . Vitamin B 12 deficiency     Past Surgical History:  Procedure Laterality Date  . BLADDER  SUSPENSION  2002  . BREAST SURGERY     several benign cysts removed; surgeries from [facility]   . CHOLECYSTECTOMY  1988  . EYE SURGERY  2010 amd 2012   cataracts removed bilateral   . IR FLUORO GUIDE PORT INSERTION RIGHT  02/28/2018  . IR US GUIDE VASC ACCESS RIGHT  02/28/2018  . KNEE ARTHROSCOPY Left    l knee x2  . PARATHYROIDECTOMY  2010  . ROBOTIC ASSISTED SALPINGO OOPHERECTOMY Bilateral 01/31/2018   Procedure: XI ROBOTIC ASSISTED BILATERAL SALPINGO OOPHORECTOMY, STAGING, LAPAROTOMY, PELVIC AND PARA AORTIC LYMPH NODE DISSECTION, OMENTECTOMY;  Surgeon: Isabel Caprice, MD;  Location: [facility];  Service: Gynecology;  Laterality: Bilateral;  . ROTATOR CUFF REPAIR  2005   right   . TOTAL KNEE ARTHROPLASTY Left 12/08/2014   Procedure: LEFT TOTAL KNEE ARTHROPLASTY;  Surgeon: Tobi Bastos, MD;  Location: [facility];  Service: Orthopedics;  Laterality: Left;  Marland Kitchen VAGINAL HYSTERECTOMY  1984     reports that she has never smoked. She has never used smokeless tobacco. She reports that she does not drink alcohol and does not use drugs.  Allergies  Allergen Reactions  . Penicillins Anaphylaxis    Has patient had a PCN reaction causing immediate rash, facial/tongue/throat swelling, SOB or lightheadedness with hypotension: Yes Has patient had a PCN reaction causing severe rash involving mucus membranes or skin  necrosis: No Has patient had a PCN reaction that required hospitalization: Yes Has patient had a PCN reaction occurring within the last 10 years: No If all of the above answers are "NO", then may proceed with Cephalosporin use.     Family History  Problem Relation Age of Onset  . Lymphoma Mother 29  . Bladder Cancer Sister 97  . Prostate cancer Brother 11       has surgery right away after dx  . Leukemia Brother        20's  . Breast cancer Other 38       niece, GT 2017 reportedly neg  . Prostate cancer Father 14       found on autopsy at death (97)  . Lung cancer Maternal Aunt   .  Esophageal cancer Maternal Uncle   . Other Maternal Grandfather 75       cerebral hemmhorage  . Lung cancer Cousin   . Lung cancer Cousin   . Leukemia Cousin     Prior to Admission medications   Medication Sig Start Date End Date Taking? Authorizing Provider  ALPRAZolam Duanne Moron) 0.5 MG tablet Take 0.5 mg by mouth at bedtime as needed.    [provider]  amLODipine (NORVASC) 5 MG tablet Take 5 mg by mouth daily. 01/05/20   [provider]  atorvastatin (LIPITOR) 20 MG tablet Take 10 mg by mouth daily.    [provider]  celecoxib (CELEBREX) 200 MG capsule Take 200 mg by mouth daily.    [provider]  Cyanocobalamin (B-12 COMPLIANCE INJECTION) 1000 MCG/ML KIT Inject 1,000 mcg as directed every 30 (thirty) days.    [provider]  docusate sodium (COLACE) 100 MG capsule Take 100 mg by mouth 2 (two) times daily.    [provider]  FLUoxetine (PROZAC) 20 MG capsule Take 20 mg by mouth daily after breakfast.    [provider]  hydrochlorothiazide (HYDRODIURIL) 25 MG tablet Take 25 mg by mouth daily.    [provider]  HYDROcodone-acetaminophen (NORCO) 10-325 MG tablet Take 1 tablet by mouth every 6 (six) hours as needed. 03/27/19   Heath Lark, MD  levothyroxine (SYNTHROID) 112 MCG tablet Take 1 tablet (112 mcg total) by mouth daily before breakfast. 02/13/19   Heath Lark, MD  lidocaine-prilocaine (EMLA) cream Apply to affected area once 05/22/19   Heath Lark, MD  linaclotide (LINZESS) 145 MCG CAPS capsule Take 145 mcg by mouth daily before breakfast.    [provider]  losartan (COZAAR) 100 MG tablet Take 100 mg by mouth at bedtime.     [provider]  Melatonin 5 MG TABS Take 5-10 mg by mouth at bedtime as needed (sleep).     [provider]  metFORMIN (GLUCOPHAGE-XR) 500 MG 24 hr tablet Take 1,000 mg by mouth 2 (two) times daily.     [provider]  RUBRACA 300 MG tablet TAKE 1  TABLET (300 MG TOTAL) BY MOUTH 2 (TWO) TIMES DAILY. 01/24/21 01/24/22  Heath Lark, MD    Physical Exam: Vitals:   02/07/21 1023 02/07/21 1130 02/07/21 1215 02/07/21 1300  BP: 119/83 (!) 141/65 (!) 146/76 (!) 147/71  Pulse: 64 (!) 57 (!) 50 64  Resp: _0 Temp: 98.1 F (36.7 C)     TempSrc: Oral     SpO2: 99% 94% 95% 94%      Constitutional: NAD, calm, comfortable Eyes: PERRL, lids and conjunctivae normal ENMT: Mucous membranes are  moist Neck: normal, supple Respiratory: clear to auscultation bilaterally, no wheezing, no crackles. Normal respiratory effort.  Cardiovascular: Regular rate and rhythm, no murmurs / rubs / gallops. No extremity edema. 2+ pedal pulses.  Abdomen: no tenderness, no masses palpated. Bowel sounds positive.  Musculoskeletal: no clubbing / cyanosis. Normal muscle tone.  Skin: no rashes, lesions, ulcers. No induration Neurologic: CN 2-12 grossly intact. Strength 5/5 in all 4.  Exam limited by hip pain Psychiatric: Normal judgment and insight. Alert and oriented x 3. Normal mood.   Labs on Admission: I have personally reviewed following labs and imaging studies  CBC: Recent Labs  Lab 02/07/21 1131  WBC 8.8  NEUTROABS 6.8  HGB 12.9  HCT 38.2  MCV 99.5  PLT PLATELET CLUMPS NOTED ON SMEAR, UNABLE TO ESTIMATE   Basic Metabolic Panel: Recent Labs  Lab 02/07/21 1131  NA 136  K 3.8  CL 100  CO2 25  GLUCOSE 131*  BUN 22  CREATININE 0.80  CALCIUM 9.0   Liver Function Tests: Recent Labs  Lab 02/07/21 1131  AST 33  ALT 43  ALKPHOS 55  BILITOT 1.3*  PROT 6.0*  ALBUMIN 3.4*   Coagulation Profile: Recent Labs  Lab 02/07/21 1131  INR 1.0   BNP (last 3 results) No results for input(s): PROBNP in the last 8760 hours. CBG: No results for input(s): GLUCAP in the last 168 hours. Thyroid Function Tests: No results for input(s): TSH, T4TOTAL, FREET4, T3FREE, THYROIDAB in the last 72 hours. Urine analysis:    Component Value Date/Time    COLORURINE YELLOW 02/07/2021 1133   APPEARANCEUR HAZY (A) 02/07/2021 1133   LABSPEC 1.026 02/07/2021 1133   PHURINE 6.0 02/07/2021 1133   GLUCOSEU NEGATIVE 02/07/2021 1133   HGBUR NEGATIVE 02/07/2021 1133   BILIRUBINUR NEGATIVE 02/07/2021 1133   KETONESUR 5 (A) 02/07/2021 1133   PROTEINUR 30 (A) 02/07/2021 1133   UROBILINOGEN 0.2 12/01/2014 1405   NITRITE NEGATIVE 02/07/2021 1133   LEUKOCYTESUR TRACE (A) 02/07/2021 1133     Radiological Exams on Admission: No results found.  Assessment/Plan  Principal Problem Intractable right hip pain -appeared after effort few days ago, raising concern about a stress fracture given her complete inability to stand and walk. MRI was ordered and pending.  Appreciate orthopedic surgery consultation. -Pain control, hold mobilizing with PT until imaging is back -Already received a steroid hip injection in office this morning  Active Problems Hypothyroidism-continue Synthroid  Essential hypertension-she is normotensive, hold home medications that she is receiving IV, if blood pressure stable resume home regimen tomorrow  Type 2 diabetes mellitus -hold home oral agents, placed on sliding scale  DVT prophylaxis: Lovenox  Code Status: Full code  Family Communication: no family at bedside  Disposition Plan: home when ready Bed Type: medsurg Consults called: orthopedic surgery  Obs/Inp: Obs   Marzetta Board, MD, PhD [facility]  Contact via [email or web address]  02/07/2021, 1:30 PM

## 2021-02-07 NOTE — ED Notes (Signed)
Patient transported to MRI 

## 2021-02-07 NOTE — ED Notes (Signed)
ED TO INPATIENT HANDOFF REPORT  Name/Age/Gender [name] 80 y.o. female  Code Status    Code Status Orders  (From admission, onward)         Start     Ordered   02/07/21 1641  Full code  Continuous        02/07/21 1640        Code Status History    Date Active Date Inactive Code Status Order ID Comments User Context   02/05/2018 1116 02/08/2018 1421 Full Code [id number]  Dorothyann Gibbs, NP Inpatient   01/31/2018 1440 02/02/2018 1457 Full Code [id number]  Lahoma Crocker, MD Inpatient   12/08/2014 1045 12/10/2014 1606 Full Code [id number]  Tobi Bastos, MD Inpatient   Advance Care Planning Activity    Advance Directive Documentation   Flowsheet Row Most Recent Value  Type of Advance Directive Healthcare Power of [place]  Pre-existing out of facility DNR order (yellow form or pink MOST form) --  "MOST" Form in Place? --      Home/SNF/Other Home  Chief Complaint Right hip pain [M25.551]  Level of Care/Admitting Diagnosis ED Disposition    ED Disposition Condition [place]: [place] [100102]  Level of Care: Med-Surg [16]  Covid Evaluation: Asymptomatic Screening Protocol (No Symptoms)  Diagnosis: Right hip pain [[id number]]  Admitting Physician: Caren Griffins [[id number]]  Attending Physician: Caren Griffins [phone]       Medical History Past Medical History:  Diagnosis Date  . Arthritis   . Diabetes mellitus without complication ([place])    type 2   . GERD (gastroesophageal reflux disease)   . Hypertension   . Hypothyroidism   . Ovarian cancer ([place]) 01/31/2018  . Pneumonia    its been 4 years   . PONV (postoperative nausea and vomiting)   . Vitamin B 12 deficiency     Allergies Allergies  Allergen Reactions  . Penicillins Anaphylaxis    Has patient had a PCN reaction causing immediate rash, facial/tongue/throat swelling, SOB or lightheadedness with hypotension: Yes Has patient had a PCN reaction  causing severe rash involving mucus membranes or skin necrosis: No Has patient had a PCN reaction that required hospitalization: Yes Has patient had a PCN reaction occurring within the last 10 years: No If all of the above answers are "NO", then may proceed with Cephalosporin use.   . Rosuvastatin Itching    Possible. Possible.     IV Location/Drains/Wounds Patient Lines/Drains/Airways Status    Active Line/Drains/Airways    Name Placement date Placement time Site Days   Implanted Port 02/28/18 Right Chest 02/28/18  1251  Chest  1075   Incision (Closed) 01/31/18 Abdomen 01/31/18  1242  -- 1103   Incision - 6 Ports Abdomen 1: Umbilicus 2: Right;Mid;Medial 3: Right;Mid;Lateral 4: Mid;Lower 5: Left;Mid;Lateral 6: Left;Lateral;Upper 01/31/18  0810  -- 1103          Labs/Imaging Results for orders placed or performed during the hospital encounter of 02/07/21 (from the past 48 hour(s))  CBC with Differential     Status: Abnormal   Collection Time: 02/07/21 11:31 AM  Result Value Ref Range   WBC 8.8 4.0 - 10.5 K/uL   RBC 3.84 (L) 3.87 - 5.11 MIL/uL   Hemoglobin 12.9 12.0 - 15.0 g/dL   HCT 38.2 36.0 - 46.0 %   MCV 99.5 80.0 - 100.0 fL   MCH 33.6 26.0 - 34.0 pg   MCHC 33.8 30.0 -  36.0 g/dL   RDW 14.7 11.5 - 15.5 %   Platelets PLATELET CLUMPS NOTED ON SMEAR, UNABLE TO ESTIMATE 150 - 400 K/uL   nRBC 0.0 0.0 - 0.2 %   Neutrophils Relative % 78 %   Neutro Abs 6.8 1.7 - 7.7 K/uL   Lymphocytes Relative 15 %   Lymphs Abs 1.3 0.7 - 4.0 K/uL   Monocytes Relative 5 %   Monocytes Absolute 0.4 0.1 - 1.0 K/uL   Eosinophils Relative 1 %   Eosinophils Absolute 0.1 0.0 - 0.5 K/uL   Basophils Relative 0 %   Basophils Absolute 0.0 0.0 - 0.1 K/uL   Immature Granulocytes 1 %   Abs Immature Granulocytes 0.10 (H) 0.00 - 0.07 K/uL    Comment: Performed at [facility], [place] [address], [place], [place] [address]  Protime-INR     Status: None   Collection Time: 02/07/21 11:31 AM   Result Value Ref Range   Prothrombin Time 12.8 11.4 - 15.2 seconds   INR 1.0 0.8 - 1.2    Comment: (NOTE) INR goal varies based on device and disease states. Performed at [facility], [place] [address], [place], [place] [address]   Comprehensive metabolic panel     Status: Abnormal   Collection Time: 02/07/21 11:31 AM  Result Value Ref Range   Sodium 136 135 - 145 mmol/L   Potassium 3.8 3.5 - 5.1 mmol/L   Chloride 100 98 - 111 mmol/L   CO2 25 22 - 32 mmol/L   Glucose, Bld 131 (H) 70 - 99 mg/dL    Comment: Glucose reference range applies only to samples taken after fasting for at least 8 hours.   BUN 22 8 - 23 mg/dL   Creatinine, Ser 0.80 0.44 - 1.00 mg/dL   Calcium 9.0 8.9 - 10.3 mg/dL   Total Protein 6.0 (L) 6.5 - 8.1 g/dL   Albumin 3.4 (L) 3.5 - 5.0 g/dL   AST 33 15 - 41 U/L   ALT 43 0 - 44 U/L   Alkaline Phosphatase 55 38 - 126 U/L   Total Bilirubin 1.3 (H) 0.3 - 1.2 mg/dL   GFR, Estimated >60 >60 mL/min    Comment: (NOTE) Calculated using the CKD-EPI Creatinine Equation (2021)    Anion gap 11 5 - 15    Comment: Performed at [facility], [place] [address] [place], [place] [address]  Hemoglobin A1c     Status: Abnormal   Collection Time: 02/07/21 11:31 AM  Result Value Ref Range   Hgb A1c MFr Bld 6.4 (H) 4.8 - 5.6 %    Comment: (NOTE) Pre diabetes:          5.7%-6.4%  Diabetes:              >6.4%  Glycemic control for   <7.0% adults with diabetes    Mean Plasma Glucose 136.98 mg/dL    Comment: Performed at [facility] [address] [place],  [address]  Urinalysis, Routine w reflex microscopic     Status: Abnormal   Collection Time: 02/07/21 11:33 AM  Result Value Ref Range   Color, Urine YELLOW YELLOW   APPearance HAZY (A) CLEAR   Specific Gravity, Urine 1.026 1.005 - 1.030   pH 6.0 5.0 - 8.0   Glucose, UA NEGATIVE NEGATIVE mg/dL   Hgb urine dipstick NEGATIVE NEGATIVE   Bilirubin Urine NEGATIVE NEGATIVE    Ketones, ur 5 (A) NEGATIVE mg/dL   Protein,  ur 30 (A) NEGATIVE mg/dL   Nitrite NEGATIVE NEGATIVE   Leukocytes,Ua TRACE (A) NEGATIVE   RBC / HPF 0-5 0 - 5 RBC/hpf   WBC, UA 0-5 0 - 5 WBC/hpf   Bacteria, UA RARE (A) NONE SEEN   Squamous Epithelial / LPF 6-10 0 - 5   Mucus PRESENT     Comment: Performed at [facility], [place] [address], [place], [place] [address]  Resp Panel by RT-PCR (Flu A&B, Covid) Nasopharyngeal Swab     Status: None   Collection Time: 02/07/21  1:00 PM   Specimen: Nasopharyngeal Swab; Nasopharyngeal(NP) swabs in vial transport medium  Result Value Ref Range   SARS Coronavirus 2 by RT PCR NEGATIVE NEGATIVE    Comment: (NOTE) SARS-CoV-2 target nucleic acids are NOT DETECTED.  The SARS-CoV-2 RNA is generally detectable in upper respiratory specimens during the acute phase of infection. The lowest concentration of SARS-CoV-2 viral copies this assay can detect is 138 copies/mL. A negative result does not preclude SARS-Cov-2 infection and should not be used as the sole basis for treatment or other patient management decisions. A negative result may occur with  improper specimen collection/handling, submission of specimen other than nasopharyngeal swab, presence of viral mutation(s) within the areas targeted by this assay, and inadequate number of viral copies(<138 copies/mL). A negative result must be combined with clinical observations, patient history, and epidemiological information. The expected result is Negative.  Fact Sheet for Patients:  EntrepreneurPulse.com.au  Fact Sheet for Healthcare Providers:  IncredibleEmployment.be  This test is no t yet approved or cleared by the [place] FDA and  has been authorized for detection and/or diagnosis of SARS-CoV-2 by FDA under an Emergency Use Authorization (EUA). This EUA will remain  in effect (meaning this test can be used) for the duration of  the COVID-19 declaration under Section 564(b)(1) of the Act, 21 U.S.C.section 360bbb-3(b)(1), unless the authorization is terminated  or revoked sooner.       Influenza A by PCR NEGATIVE NEGATIVE   Influenza B by PCR NEGATIVE NEGATIVE    Comment: (NOTE) The Xpert Xpress SARS-CoV-2/FLU/RSV plus assay is intended as an aid in the diagnosis of influenza from Nasopharyngeal swab specimens and should not be used as a sole basis for treatment. Nasal washings and aspirates are unacceptable for Xpert Xpress SARS-CoV-2/FLU/RSV testing.  Fact Sheet for Patients: EntrepreneurPulse.com.au  Fact Sheet for Healthcare Providers: IncredibleEmployment.be  This test is not yet approved or cleared by the [place] FDA and has been authorized for detection and/or diagnosis of SARS-CoV-2 by FDA under an Emergency Use Authorization (EUA). This EUA will remain in effect (meaning this test can be used) for the duration of the COVID-19 declaration under Section 564(b)(1) of the Act, 21 U.S.C. section 360bbb-3(b)(1), unless the authorization is terminated or revoked.  Performed at [facility], [place] [address], [place],  [address]   CBG monitoring, ED     Status: Abnormal   Collection Time: 02/07/21  4:49 PM  Result Value Ref Range   Glucose-Capillary 125 (H) 70 - 99 mg/dL    Comment: Glucose reference range applies only to samples taken after fasting for at least 8 hours.   MR LUMBAR SPINE WO CONTRAST  Result Date: 02/07/2021 CLINICAL DATA:  Right lower back, buttock, and hip pain radiating to the knee. EXAM: MRI LUMBAR SPINE WITHOUT CONTRAST TECHNIQUE: Multiplanar, multisequence MR imaging of the lumbar spine was performed. No intravenous contrast was administered. COMPARISON:  Noncontrast lumbar spine  CT 09/01/2020 FINDINGS: Segmentation: Transitional lumbosacral anatomy with a Lumbarized S1. Alignment: Mild lumbar levoscoliosis.  Unchanged trace retrolisthesis of L1 on L2. Vertebrae: L4 superior endplate compression fracture with mild edema and at most 10% vertebral body height loss. T12 superior endplate Schmorl's node. T12 and L5 superior endplate Schmorl's nodes no suspicious marrow lesion. Conus medullaris and cauda equina: Conus extends to the L2 level. Conus and cauda equina appear normal. Paraspinal and other soft tissues: Unremarkable. Disc levels: L1-2: Disc desiccation and mild disc space narrowing. Disc bulging without stenosis. L2-3: Negative. L3-4: Mild disc space narrowing. There is a new large right paracentral disc extrusion with superior migration to the L3 pedicle level resulting in severe right lateral recess stenosis and right L3 nerve root impingement. Disc bulging and mild facet and ligamentum flavum hypertrophy result in mild bilateral neural foraminal stenosis. No significant generalized spinal stenosis. L4-5: Disc desiccation and moderate disc space narrowing. Disc bulging and mild-to-moderate facet and ligamentum flavum hypertrophy result in mild bilateral lateral recess stenosis and mild-to-moderate right neural foraminal stenosis, similar to the prior CT. No significant generalized spinal stenosis. L5-S1: Disc desiccation and severe left-sided disc space narrowing. Left eccentric disc bulging and mild facet hypertrophy result in mild to moderate bilateral lateral recess stenosis and mild right and moderate to severe left neural foraminal stenosis, similar to the prior CT. No significant generalized spinal stenosis. S1-2: Transitional anatomy. Unchanged mild left neural foraminal stenosis due to spurring. No spinal stenosis. IMPRESSION: 1. New large right paracentral disc extrusion at L3-4 with right L3 nerve root impingement in the lateral recess. 2. Acute or subacute L4 compression fracture with 10% height loss. 3. 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 Electronically Signed   By: Sebastian Ache M.D.   On: 02/07/2021 16:19   MR HIP RIGHT WO CONTRAST  Result Date: 02/07/2021 CLINICAL DATA:  Hip pain, stress fracture suspected, neg xray. Discomfort in her right lower back buttock hip EXAM: MR OF THE RIGHT HIP WITHOUT CONTRAST TECHNIQUE: Multiplanar, multisequence MR imaging was performed. No intravenous contrast was administered. COMPARISON:  CT abdomen pelvis 06/23/2020 FINDINGS: Bones: No evidence of fracture. Focal reactive bony edema and cystic change in the right greater trochanter related to gluteal tendon pathology. Articular cartilage and labrum Articular cartilage:  Mild chondrosis. Labrum:  Degenerative superior labral tearing. Joint or bursal effusion Joint effusion:  Small effusion, symmetric with the left hip. Bursae: Sub gluteus minimus Muscles and tendons Muscles and tendons: There are full-thickness gluteus medius and minimus tears with mild fluid in the subgluteus bursa at the greater trochanter. There is chronic gluteus medius and gluteus minimus muscle atrophy as seen on prior CT September 2021, with intramuscular edema in the residual muscle belly. There is intramuscular edema within the right pectineus muscle and abductor brevis muscle Mild tendinosis of the proximal hamstring tendons. Other findings Miscellaneous:   Prior hysterectomy. IMPRESSION: Full-thickness gluteus minimus and medius tendon tears with associated mild trochanteric bursitis. Atrophy of the gluteus minimus and medius muscles compatible with chronic tear. Intramuscular edema within the right hip adductor muscles and residual gluteus medius/minimus muscles compatible grade 1 muscle strain. 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 Electronically Signed   By: Caprice Renshaw   On: 02/07/2021 16:33    Pending Labs Unresulted Labs (From admission, onward)          Start     Ordered   02/08/21 0500  Comprehensive metabolic panel  Tomorrow morning,  R        02/07/21 1640    02/08/21 0500  CBC  Tomorrow morning,   R        02/07/21 1640          Vitals/Pain Today's Vitals   02/07/21 1715 02/07/21 1730 02/07/21 1800 02/07/21 1915  BP: (!) 158/80 (!) 163/80 (!) 151/55   Pulse: 78 68 85 67  Resp: 13 14 (!) 21 20  Temp:      TempSrc:      SpO2: 100% 98% 94% 96%  PainSc:  0-No pain      Isolation Precautions No active isolations  Medications Medications  HYDROmorphone (DILAUDID) injection 0.5 mg (0.5 mg Intravenous Given 02/07/21 1702)  LORazepam (ATIVAN) injection 0.5 mg (has no administration in time range)  lactated ringers infusion ( Intravenous Infusion Verify 02/07/21 1356)  HYDROcodone-acetaminophen (NORCO) 10-325 MG per tablet 1 tablet (has no administration in time range)  atorvastatin (LIPITOR) tablet 10 mg (10 mg Oral Given 02/07/21 1712)  amLODipine (NORVASC) tablet 5 mg (5 mg Oral Not Given 02/07/21 1708)  hydrochlorothiazide (HYDRODIURIL) tablet 25 mg (25 mg Oral Not Given 02/07/21 1709)  ALPRAZolam (XANAX) tablet 0.5 mg (has no administration in time range)  FLUoxetine (PROZAC) capsule 20 mg (has no administration in time range)  levothyroxine (SYNTHROID) tablet 112 mcg (has no administration in time range)  enoxaparin (LOVENOX) injection 40 mg (has no administration in time range)  insulin aspart (novoLOG) injection 0-9 Units (1 Units Subcutaneous Given 02/07/21 1710)  rucaparib camsylate (RUBRACA) tablet 300 mg (has no administration in time range)    Mobility Usually independent at home, standby to 1 person assist due to pain

## 2021-02-08 ENCOUNTER — Telehealth: Payer: Self-pay | Admitting: Hematology and Oncology

## 2021-02-08 ENCOUNTER — Other Ambulatory Visit: Payer: Self-pay

## 2021-02-08 DIAGNOSIS — Z880 Allergy status to penicillin: Secondary | ICD-10-CM | POA: Diagnosis not present

## 2021-02-08 DIAGNOSIS — Z8042 Family history of malignant neoplasm of prostate: Secondary | ICD-10-CM | POA: Diagnosis not present

## 2021-02-08 DIAGNOSIS — E119 Type 2 diabetes mellitus without complications: Secondary | ICD-10-CM | POA: Diagnosis present

## 2021-02-08 DIAGNOSIS — Z7984 Long term (current) use of oral hypoglycemic drugs: Secondary | ICD-10-CM | POA: Diagnosis not present

## 2021-02-08 DIAGNOSIS — Z806 Family history of leukemia: Secondary | ICD-10-CM | POA: Diagnosis not present

## 2021-02-08 DIAGNOSIS — Z803 Family history of malignant neoplasm of breast: Secondary | ICD-10-CM | POA: Diagnosis not present

## 2021-02-08 DIAGNOSIS — I10 Essential (primary) hypertension: Secondary | ICD-10-CM | POA: Diagnosis present

## 2021-02-08 DIAGNOSIS — R52 Pain, unspecified: Secondary | ICD-10-CM | POA: Diagnosis not present

## 2021-02-08 DIAGNOSIS — Z20822 Contact with and (suspected) exposure to covid-19: Secondary | ICD-10-CM | POA: Diagnosis present

## 2021-02-08 DIAGNOSIS — M5417 Radiculopathy, lumbosacral region: Secondary | ICD-10-CM

## 2021-02-08 DIAGNOSIS — M25551 Pain in right hip: Secondary | ICD-10-CM

## 2021-02-08 DIAGNOSIS — M7061 Trochanteric bursitis, right hip: Secondary | ICD-10-CM | POA: Diagnosis present

## 2021-02-08 DIAGNOSIS — K5900 Constipation, unspecified: Secondary | ICD-10-CM | POA: Diagnosis present

## 2021-02-08 DIAGNOSIS — M1990 Unspecified osteoarthritis, unspecified site: Secondary | ICD-10-CM | POA: Diagnosis present

## 2021-02-08 DIAGNOSIS — Z8543 Personal history of malignant neoplasm of ovary: Secondary | ICD-10-CM | POA: Diagnosis not present

## 2021-02-08 DIAGNOSIS — M5116 Intervertebral disc disorders with radiculopathy, lumbar region: Secondary | ICD-10-CM | POA: Diagnosis present

## 2021-02-08 DIAGNOSIS — F32A Depression, unspecified: Secondary | ICD-10-CM | POA: Diagnosis present

## 2021-02-08 DIAGNOSIS — K219 Gastro-esophageal reflux disease without esophagitis: Secondary | ICD-10-CM | POA: Diagnosis present

## 2021-02-08 DIAGNOSIS — Z800 Family history of malignant neoplasm of digestive organs: Secondary | ICD-10-CM | POA: Diagnosis not present

## 2021-02-08 DIAGNOSIS — Z96652 Presence of left artificial knee joint: Secondary | ICD-10-CM | POA: Diagnosis present

## 2021-02-08 DIAGNOSIS — Z8052 Family history of malignant neoplasm of bladder: Secondary | ICD-10-CM | POA: Diagnosis not present

## 2021-02-08 DIAGNOSIS — Z79890 Hormone replacement therapy (postmenopausal): Secondary | ICD-10-CM | POA: Diagnosis not present

## 2021-02-08 DIAGNOSIS — Z801 Family history of malignant neoplasm of trachea, bronchus and lung: Secondary | ICD-10-CM | POA: Diagnosis not present

## 2021-02-08 DIAGNOSIS — M8008XA Age-related osteoporosis with current pathological fracture, vertebra(e), initial encounter for fracture: Secondary | ICD-10-CM | POA: Diagnosis present

## 2021-02-08 DIAGNOSIS — Z79899 Other long term (current) drug therapy: Secondary | ICD-10-CM | POA: Diagnosis not present

## 2021-02-08 DIAGNOSIS — Z807 Family history of other malignant neoplasms of lymphoid, hematopoietic and related tissues: Secondary | ICD-10-CM | POA: Diagnosis not present

## 2021-02-08 DIAGNOSIS — E039 Hypothyroidism, unspecified: Secondary | ICD-10-CM | POA: Diagnosis present

## 2021-02-08 LAB — GLUCOSE, CAPILLARY
Glucose-Capillary: 118 mg/dL — ABNORMAL HIGH (ref 70–99)
Glucose-Capillary: 102 mg/dL — ABNORMAL HIGH (ref 70–99)
Glucose-Capillary: 138 mg/dL — ABNORMAL HIGH (ref 70–99)
Glucose-Capillary: 136 mg/dL — ABNORMAL HIGH (ref 70–99)

## 2021-02-08 LAB — CBC
HCT: 38.6 % (ref 36.0–46.0)
Hemoglobin: 12.9 g/dL (ref 12.0–15.0)
MCH: 33.8 pg (ref 26.0–34.0)
MCHC: 33.4 g/dL (ref 30.0–36.0)
MCV: 101 fL — ABNORMAL HIGH (ref 80.0–100.0)
Platelets: 155 10*3/uL (ref 150–400)
RBC: 3.82 MIL/uL — ABNORMAL LOW (ref 3.87–5.11)
RDW: 14.7 % (ref 11.5–15.5)
WBC: 7.3 10*3/uL (ref 4.0–10.5)
nRBC: 0 % (ref 0.0–0.2)

## 2021-02-08 LAB — COMPREHENSIVE METABOLIC PANEL
ALT: 36 U/L (ref 0–44)
AST: 27 U/L (ref 15–41)
Albumin: 3.3 g/dL — ABNORMAL LOW (ref 3.5–5.0)
Alkaline Phosphatase: 53 U/L (ref 38–126)
Anion gap: 7 (ref 5–15)
BUN: 19 mg/dL (ref 8–23)
CO2: 29 mmol/L (ref 22–32)
Calcium: 9.2 mg/dL (ref 8.9–10.3)
Chloride: 102 mmol/L (ref 98–111)
Creatinine, Ser: 0.75 mg/dL (ref 0.44–1.00)
GFR, Estimated: >60 mL/min (ref >60)
Glucose, Bld: 132 mg/dL — ABNORMAL HIGH (ref 70–99)
Potassium: 4.2 mmol/L (ref 3.5–5.1)
Sodium: 138 mmol/L (ref 135–145)
Total Bilirubin: 1.2 mg/dL (ref 0.3–1.2)
Total Protein: 6.1 g/dL — ABNORMAL LOW (ref 6.5–8.1)

## 2021-02-08 LAB — SURGICAL PCR SCREEN
MRSA, PCR: NEGATIVE
Staphylococcus aureus: POSITIVE — AB

## 2021-02-08 MED ORDER — LOSARTAN POTASSIUM 50 MG PO TABS
100.0000 mg | ORAL_TABLET | Freq: Every day | ORAL | Status: DC
  Administered 2021-02-08: 100 mg via ORAL
  Filled 2021-02-08: qty 2

## 2021-02-08 MED ORDER — DICLOFENAC SODIUM 1 % EX GEL
2.0000 g | Freq: Four times a day (QID) | CUTANEOUS | Status: DC
  Administered 2021-02-08: 2 g via TOPICAL
  Filled 2021-02-08: qty 100

## 2021-02-08 MED ORDER — CHLORHEXIDINE GLUCONATE CLOTH 2 % EX PADS
6.0000 | MEDICATED_PAD | Freq: Every day | CUTANEOUS | Status: DC
  Administered 2021-02-08 – 2021-02-09 (×2): 6 via TOPICAL

## 2021-02-08 MED ORDER — LIDOCAINE 5 % EX PTCH
1.0000 | MEDICATED_PATCH | CUTANEOUS | Status: DC
  Administered 2021-02-08: 1 via TRANSDERMAL
  Filled 2021-02-08: qty 1

## 2021-02-08 MED ORDER — SODIUM CHLORIDE 0.9% FLUSH
10.0000 mL | INTRAVENOUS | Status: DC | PRN
  Administered 2021-02-08: 10 mL

## 2021-02-08 MED ORDER — MUPIROCIN 2 % EX OINT
1.0000 "application " | TOPICAL_OINTMENT | Freq: Two times a day (BID) | CUTANEOUS | Status: DC
  Administered 2021-02-08 – 2021-02-09 (×2): 1 via NASAL
  Filled 2021-02-08: qty 22

## 2021-02-08 MED ORDER — LACTATED RINGERS IV SOLN: INTRAVENOUS | Status: DC

## 2021-02-08 MED ORDER — METHOCARBAMOL 500 MG PO TABS
500.0000 mg | ORAL_TABLET | Freq: Three times a day (TID) | ORAL | Status: DC | PRN
  Administered 2021-02-08 – 2021-02-09 (×2): 500 mg via ORAL
  Filled 2021-02-08 (×2): qty 1

## 2021-02-08 MED ORDER — LORATADINE 10 MG PO TABS
10.0000 mg | ORAL_TABLET | Freq: Every day | ORAL | Status: DC
  Administered 2021-02-08 – 2021-02-09 (×2): 10 mg via ORAL
  Filled 2021-02-08 (×2): qty 1

## 2021-02-08 NOTE — Progress Notes (Signed)
PROGRESS NOTE    [name]  MWN:[id number] DOB: 1940/12/31 DOA: 02/07/2021 PCP: Raina Mina., MD   Brief Narrative: This is a very pleasant 80 year old female who lives at home alone who loves to garden and plant trees.  She has history of hypertension hypothyroidism type 2 diabetes ovarian cancer.  She unloaded some mulch last week since then she has been experiencing severe right hip pain unable to walk or stand or move.  She was seen at the Ortho office yesterday with injection to her right hip without any improvement.  She was sent to the hospital from Ortho office for admission due to uncontrolled pain and for pain control.  Assessment & Plan:   Active Problems:   Right hip pain  #1Intractable right hip/back pain-multifactorial etiology.  Patient complaining of 10 out of 10 pain receiving IV Dilaudid.  At baseline she is a very active woman does all the work at home gardening and planting trees.  She is very concerned about the pain. 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 MRI hip full-thickness gluteus minimus and medius tendon tear with associated mild trochanteric bursitis.  Atrophy of the gluteus minimus and medius muscles compatible with chronic tear.  Intramuscular edema within the right hip adductor muscles and residual gluteus medius and minimus muscles compatible with grade 1 muscle strain, mild right hip osteoarthritis . Appreciate Ortho input.  They are considering surgical intervention L3-L4 decompression discectomy and possible kyphoplasty.  Await final decision from Ortho.  Patient will need to be moved to [facility] if surgery is being done. Add lidocaine patch and diclofenac gel.  #2 hypothyroidism on Synthroid  #3 type 2 diabetes on SSI holding oral  agents.  She is on glimepiride at home. CBG (last 3)  Recent Labs    02/07/21 2206 02/08/21 0722 02/08/21 1118  GLUCAP 145* 118* 102*     #4 essential hypertension-blood pressure 143/65.  On HCTZ and Norvasc.  #5 history of ovarian cancer informed to Dr. Simeon Craft such advised to hold oral chemo and follow-up with her as an outpatient.  #6 hyperlipidemia on Lipitor.  #7 depression on Prozac.  #8 chronic constipation restart Linzess.  Estimated body mass index is 27.81 kg/m as calculated from the following:   Height as of 01/11/21: 5\' 4"  (1.626 m).   Weight as of 01/11/21: 73.5 kg.  DVT prophylaxis: Lovenox on hold for possible surgery  code Status: Full code  family Communication: None at bedside Disposition Plan:  Status is: Observation   Dispo: The patient is from: Home              Anticipated d/c is to: Home              Patient currently is not medically stable to d/c.   Difficult to place patient No    Consultants: Ortho  Procedures: None Antimicrobials: None  Subjective: Patient describes 10 out of 10 pain with movement of the right leg. She is tearful not able to move.  She is a very active person.  Objective: Vitals:   02/08/21 0401 02/08/21 0600 02/08/21 0955 02/08/21 1317  BP: (!) 156/84 (!) 145/64 (!) 152/74 (!) 143/65  Pulse: 62 (!) 57 66 70  Resp: 18 18 16 16   Temp: 98.5 F (36.9 C) 98.1  F (36.7 C) 98.3 F (36.8 C)   TempSrc: Oral Oral Oral   SpO2: 97% 99% 95% 98%    Intake/Output Summary (Last 24 hours) at 02/08/2021 1340 Last data filed at 02/08/2021 1000 Gross per 24 hour  Intake 2999.71 ml  Output 1300 ml  Net 1699.71 ml   There were no vitals filed for this visit.  Examination:  General exam: Appears calm and comfortable  Respiratory system: Clear to auscultation. Respiratory effort normal. Cardiovascular system: S1 & S2 heard, RRR. No JVD, murmurs, rubs, gallops or clicks. No pedal edema. Gastrointestinal system: Abdomen is  nondistended, soft and nontender. No organomegaly or masses felt. Normal bowel sounds heard. Central nervous system: Alert and oriented. No focal neurological deficits. Extremities: Decreased range of motion to the right hip tender right hip lateral aspect Skin: No rashes, lesions or ulcers Psychiatry: Judgement and insight appear normal. Mood & affect appropriate.     Data Reviewed: I have personally reviewed following labs and imaging studies  CBC: Recent Labs  Lab 02/07/21 1131 02/08/21 0447  WBC 8.8 7.3  NEUTROABS 6.8  --   HGB 12.9 12.9  HCT 38.2 38.6  MCV 99.5 101.0*  PLT PLATELET CLUMPS NOTED ON SMEAR, UNABLE TO ESTIMATE 235   Basic Metabolic Panel: Recent Labs  Lab 02/07/21 1131 02/08/21 0447  NA 136 138  K 3.8 4.2  CL 100 102  CO2 25 29  GLUCOSE 131* 132*  BUN 22 19  CREATININE 0.80 0.75  CALCIUM 9.0 9.2   GFR: CrCl cannot be calculated (Unknown ideal weight.). Liver Function Tests: Recent Labs  Lab 02/07/21 1131 02/08/21 0447  AST 33 27  ALT 43 36  ALKPHOS 55 53  BILITOT 1.3* 1.2  PROT 6.0* 6.1*  ALBUMIN 3.4* 3.3*   No results for input(s): LIPASE, AMYLASE in the last 168 hours. No results for input(s): AMMONIA in the last 168 hours. Coagulation Profile: Recent Labs  Lab 02/07/21 1131  INR 1.0   Cardiac Enzymes: No results for input(s): CKTOTAL, CKMB, CKMBINDEX, TROPONINI in the last 168 hours. BNP (last 3 results) No results for input(s): PROBNP in the last 8760 hours. HbA1C: Recent Labs    02/07/21 1131  HGBA1C 6.4*   CBG: Recent Labs  Lab 02/07/21 1649 02/07/21 2206 02/08/21 0722 02/08/21 1118  GLUCAP 125* 145* 118* 102*   Lipid Profile: No results for input(s): CHOL, HDL, LDLCALC, TRIG, CHOLHDL, LDLDIRECT in the last 72 hours. Thyroid Function Tests: No results for input(s): TSH, T4TOTAL, FREET4, T3FREE, THYROIDAB in the last 72 hours. Anemia Panel: No results for input(s): VITAMINB12, FOLATE, FERRITIN, TIBC, IRON,  RETICCTPCT in the last 72 hours. Sepsis Labs: No results for input(s): PROCALCITON, LATICACIDVEN in the last 168 hours.  Recent Results (from the past 240 hour(s))  Resp Panel by RT-PCR (Flu A&B, Covid) Nasopharyngeal Swab     Status: None   Collection Time: 02/07/21  1:00 PM   Specimen: Nasopharyngeal Swab; Nasopharyngeal(NP) swabs in vial transport medium  Result Value Ref Range Status   SARS Coronavirus 2 by RT PCR NEGATIVE NEGATIVE Final    Comment: (NOTE) SARS-CoV-2 target nucleic acids are NOT DETECTED.  The SARS-CoV-2 RNA is generally detectable in upper respiratory specimens during the acute phase of infection. The lowest concentration of SARS-CoV-2 viral copies this assay can detect is 138 copies/mL. A negative result does not preclude SARS-Cov-2 infection and should not be used as the sole basis for treatment or other patient management decisions. A negative result may occur  with  improper specimen collection/handling, submission of specimen other than nasopharyngeal swab, presence of viral mutation(s) within the areas targeted by this assay, and inadequate number of viral copies(<138 copies/mL). A negative result must be combined with clinical observations, patient history, and epidemiological information. The expected result is Negative.  Fact Sheet for Patients:  EntrepreneurPulse.com.au  Fact Sheet for Healthcare Providers:  IncredibleEmployment.be  This test is no t yet approved or cleared by the [place] FDA and  has been authorized for detection and/or diagnosis of SARS-CoV-2 by FDA under an Emergency Use Authorization (EUA). This EUA will remain  in effect (meaning this test can be used) for the duration of the COVID-19 declaration under Section 564(b)(1) of the Act, 21 U.S.C.section 360bbb-3(b)(1), unless the authorization is terminated  or revoked sooner.       Influenza A by PCR NEGATIVE NEGATIVE Final   Influenza  B by PCR NEGATIVE NEGATIVE Final    Comment: (NOTE) The Xpert Xpress SARS-CoV-2/FLU/RSV plus assay is intended as an aid in the diagnosis of influenza from Nasopharyngeal swab specimens and should not be used as a sole basis for treatment. Nasal washings and aspirates are unacceptable for Xpert Xpress SARS-CoV-2/FLU/RSV testing.  Fact Sheet for Patients: EntrepreneurPulse.com.au  Fact Sheet for Healthcare Providers: IncredibleEmployment.be  This test is not yet approved or cleared by the [place] FDA and has been authorized for detection and/or diagnosis of SARS-CoV-2 by FDA under an Emergency Use Authorization (EUA). This EUA will remain in effect (meaning this test can be used) for the duration of the COVID-19 declaration under Section 564(b)(1) of the Act, 21 U.S.C. section 360bbb-3(b)(1), unless the authorization is terminated or revoked.  Performed at [facility], [place] [address], [place], [place] [address]          Radiology Studies: MR LUMBAR SPINE WO CONTRAST  Result Date: 02/07/2021 CLINICAL DATA:  Right lower back, buttock, and hip pain radiating to the knee. EXAM: MRI LUMBAR SPINE WITHOUT CONTRAST TECHNIQUE: Multiplanar, multisequence MR imaging of the lumbar spine was performed. No intravenous contrast was administered. COMPARISON:  Noncontrast lumbar spine CT 09/01/2020 FINDINGS: Segmentation: Transitional lumbosacral anatomy with a Lumbarized S1. Alignment: Mild lumbar levoscoliosis. Unchanged trace retrolisthesis of L1 on L2. Vertebrae: L4 superior endplate compression fracture with mild edema and at most 10% vertebral body height loss. T12 superior endplate Schmorl's node. T12 and L5 superior endplate Schmorl's nodes no suspicious marrow lesion. Conus medullaris and cauda equina: Conus extends to the L2 level. Conus and cauda equina appear normal. Paraspinal and other soft tissues: Unremarkable. Disc levels:  L1-2: Disc desiccation and mild disc space narrowing. Disc bulging without stenosis. L2-3: Negative. L3-4: Mild disc space narrowing. There is a new large right paracentral disc extrusion with superior migration to the L3 pedicle level resulting in severe right lateral recess stenosis and right L3 nerve root impingement. Disc bulging and mild facet and ligamentum flavum hypertrophy result in mild bilateral neural foraminal stenosis. No significant generalized spinal stenosis. L4-5: Disc desiccation and moderate disc space narrowing. Disc bulging and mild-to-moderate facet and ligamentum flavum hypertrophy result in mild bilateral lateral recess stenosis and mild-to-moderate right neural foraminal stenosis, similar to the prior CT. No significant generalized spinal stenosis. L5-S1: Disc desiccation and severe left-sided disc space narrowing. Left eccentric disc bulging and mild facet hypertrophy result in mild to moderate bilateral lateral recess stenosis and mild right and moderate to severe left neural foraminal stenosis, similar to the prior CT. No significant generalized spinal stenosis.  S1-2: Transitional anatomy. Unchanged mild left neural foraminal stenosis due to spurring. No spinal stenosis. IMPRESSION: 1. New large right paracentral disc extrusion at L3-4 with right L3 nerve root impingement in the lateral recess. 2. Acute or subacute L4 compression fracture with 10% height loss. 3. 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 Electronically Signed   By: Logan Bores M.D.   On: 02/07/2021 16:19   MR HIP RIGHT WO CONTRAST  Result Date: 02/07/2021 CLINICAL DATA:  Hip pain, stress fracture suspected, neg xray. Discomfort in her right lower back buttock hip EXAM: MR OF THE RIGHT HIP WITHOUT CONTRAST TECHNIQUE: Multiplanar, multisequence MR imaging was performed. No intravenous contrast was administered. COMPARISON:  CT abdomen pelvis 06/23/2020 FINDINGS: Bones: No evidence of fracture. Focal  reactive bony edema and cystic change in the right greater trochanter related to gluteal tendon pathology. Articular cartilage and labrum Articular cartilage:  Mild chondrosis. Labrum:  Degenerative superior labral tearing. Joint or bursal effusion Joint effusion:  Small effusion, symmetric with the left hip. Bursae: Sub gluteus minimus Muscles and tendons Muscles and tendons: There are full-thickness gluteus medius and minimus tears with mild fluid in the subgluteus bursa at the greater trochanter. There is chronic gluteus medius and gluteus minimus muscle atrophy as seen on prior CT September 2021, with intramuscular edema in the residual muscle belly. There is intramuscular edema within the right pectineus muscle and abductor brevis muscle Mild tendinosis of the proximal hamstring tendons. Other findings Miscellaneous:   Prior hysterectomy. IMPRESSION: Full-thickness gluteus minimus and medius tendon tears with associated mild trochanteric bursitis. Atrophy of the gluteus minimus and medius muscles compatible with chronic tear. Intramuscular edema within the right hip adductor muscles and residual gluteus medius/minimus muscles compatible grade 1 muscle strain. 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 Electronically Signed   By: Maurine Simmering   On: 02/07/2021 16:33        Scheduled Meds: . amLODipine  5 mg Oral Daily  . atorvastatin  10 mg Oral Daily  . Chlorhexidine Gluconate Cloth  6 each Topical Daily  . FLUoxetine  20 mg Oral QPC breakfast  . hydrochlorothiazide  25 mg Oral Daily  . insulin aspart  0-9 Units Subcutaneous TID WC  . levothyroxine  112 mcg Oral QAC breakfast  . loratadine  10 mg Oral Daily   Continuous Infusions: . lactated ringers 125 mL/hr at 02/08/21 1046     LOS: 0 days     Georgette Shell, MD [facility]  02/08/2021, 1:40 PM

## 2021-02-08 NOTE — Telephone Encounter (Signed)
I was made aware of her hospitalization I recommend stopping Rubraca while she is hospitalized and resume after discharge I have asked the patient to call me after discharge to set up follow-up appointment

## 2021-02-08 NOTE — Progress Notes (Addendum)
Fall occurred while patient was being assisted to bed from bathroom by NT. Patient tripped over IV pole and fell to her knees. Right knee is red with small abrasion/skin tear but no bleeding or open wound. Ice pack placed on left knee. Patient's daughter was contacted via phone and MD on call was notified per paging system. Patient's vitals are stable and patient has no c/o pain other than her previous pain that patient came in with. Pain meds were administered for her chronic hip and back pain. No new orders at this time. Safety and post huddle completed. Lavonna Monarch, RN

## 2021-02-08 NOTE — Consult Note (Addendum)
Patient ID: [name] MRN: [id number] DOB/AGE: [birth year] 80 y.o.  Admit date: 02/07/2021  Admission Diagnoses:  Active Problems:   Right hip pain   HPI: [name] is a 80 y.o. female with medical history significant of HTN, hypothyroidism, DM2, history of ovarian cancer, arthritis, comes to the hospital with complaint of right hip right lower back pain.  She unloaded some mulch last week subsequently she has been experiencing worsening right hip pain and is now to the point that she is unable to stand or walk.  Her pain is controlled on when she is laying completely flat but is excruciating with movements.  She was seen in Ortho office this morning, received a injection in her right hip, but due to excruciating pain she was sent for the ED to be admitted for pain control and also obtain an MRI of the hip and lumbar spine. Orthopedics was asked to consult.   Past Medical History: Past Medical History:  Diagnosis Date  . Arthritis   . Diabetes mellitus without complication ([place])    type 2   . GERD (gastroesophageal reflux disease)   . Hypertension   . Hypothyroidism   . Ovarian cancer ([place]) 01/31/2018  . Pneumonia    its been 4 years   . PONV (postoperative nausea and vomiting)   . Vitamin B 12 deficiency     Surgical History: Past Surgical History:  Procedure Laterality Date  . BLADDER SUSPENSION  2002  . BREAST SURGERY     several benign cysts removed; surgeries from [place]   . CHOLECYSTECTOMY  1988  . EYE SURGERY  2010 amd 2012   cataracts removed bilateral   . IR FLUORO GUIDE PORT INSERTION RIGHT  02/28/2018  . IR US GUIDE VASC ACCESS RIGHT  02/28/2018  . KNEE ARTHROSCOPY Left    l knee x2  . PARATHYROIDECTOMY  2010  . ROBOTIC ASSISTED SALPINGO OOPHERECTOMY Bilateral 01/31/2018   Procedure: XI ROBOTIC ASSISTED BILATERAL SALPINGO OOPHORECTOMY, STAGING, LAPAROTOMY, PELVIC AND PARA AORTIC LYMPH NODE DISSECTION, OMENTECTOMY;  Surgeon: Isabel Caprice, MD;   Location: [facility];  Service: Gynecology;  Laterality: Bilateral;  . ROTATOR CUFF REPAIR  2005   right   . TOTAL KNEE ARTHROPLASTY Left 12/08/2014   Procedure: LEFT TOTAL KNEE ARTHROPLASTY;  Surgeon: Tobi Bastos, MD;  Location: [facility];  Service: Orthopedics;  Laterality: Left;  Marland Kitchen VAGINAL HYSTERECTOMY  1984    Family History: Family History  Problem Relation Age of Onset  . Lymphoma Mother 17  . Bladder Cancer Sister 59  . Prostate cancer Brother 12       has surgery right away after dx  . Leukemia Brother        37's  . Breast cancer Other 28       niece, GT 2017 reportedly neg  . Prostate cancer Father 45       found on autopsy at death (79)  . Lung cancer Maternal Aunt   . Esophageal cancer Maternal Uncle   . Other Maternal Grandfather 75       cerebral hemmhorage  . Lung cancer Cousin   . Lung cancer Cousin   . Leukemia Cousin     Social History: Social History   Socioeconomic History  . Marital status: Widowed    Spouse name: Not on file  . Number of children: 2  . Years of education: Not on file  . Highest education level: Not on file  Occupational History  .  Occupation: retired Glass blower/designer  Tobacco Use  . Smoking status: Never Smoker  . Smokeless tobacco: Never Used  Vaping Use  . Vaping Use: Never used  Substance and Sexual Activity  . Alcohol use: No  . Drug use: No  . Sexual activity: Not on file  Other Topics Concern  . Not on file  Social History Narrative  . Not on file   Social Determinants of Health   Financial Resource Strain: Not on file  Food Insecurity: Not on file  Transportation Needs: Not on file  Physical Activity: Not on file  Stress: Not on file  Social Connections: Not on file  Intimate Partner Violence: Not on file    Allergies: Penicillins and Rosuvastatin  Medications: I have reviewed the patient's current medications.  Vital Signs: Patient Vitals for the past 24 hrs:  BP Temp Temp src Pulse Resp SpO2   02/08/21 1317 (!) 143/65 -- -- 70 16 98 %  02/08/21 0955 (!) 152/74 98.3 F (36.8 C) Oral 66 16 95 %  02/08/21 0600 (!) 145/64 98.1 F (36.7 C) Oral (!) 57 18 99 %  02/08/21 0401 (!) 156/84 98.5 F (36.9 C) Oral 62 18 97 %  02/08/21 0346 (!) 171/76 98.3 F (36.8 C) Oral 60 18 100 %  02/08/21 0138 130/75 98 F (36.7 C) Oral 61 16 97 %  02/07/21 2032 133/71 98.8 F (37.1 C) Oral 63 18 97 %  02/07/21 1945 (!) 162/89 98.1 F (36.7 C) Oral 66 14 99 %  02/07/21 1915 -- -- -- 67 20 96 %  02/07/21 1800 (!) 151/55 -- -- 85 (!) 21 94 %  02/07/21 1730 (!) 163/80 -- -- 68 14 98 %  02/07/21 1715 (!) 158/80 -- -- 78 13 100 %  02/07/21 1700 (!) 155/84 -- -- 66 16 98 %  02/07/21 1653 (!) 142/78 -- -- 68 15 98 %  02/07/21 1400 (!) 156/77 -- -- 66 12 97 %  02/07/21 1330 (!) 145/93 -- -- 70 15 96 %    Radiology: MR LUMBAR SPINE WO CONTRAST  Result Date: 02/07/2021 CLINICAL DATA:  Right lower back, buttock, and hip pain radiating to the knee. EXAM: MRI LUMBAR SPINE WITHOUT CONTRAST TECHNIQUE: Multiplanar, multisequence MR imaging of the lumbar spine was performed. No intravenous contrast was administered. COMPARISON:  Noncontrast lumbar spine CT 09/01/2020 FINDINGS: Segmentation: Transitional lumbosacral anatomy with a Lumbarized S1. Alignment: Mild lumbar levoscoliosis. Unchanged trace retrolisthesis of L1 on L2. Vertebrae: L4 superior endplate compression fracture with mild edema and at most 10% vertebral body height loss. T12 superior endplate Schmorl's node. T12 and L5 superior endplate Schmorl's nodes no suspicious marrow lesion. Conus medullaris and cauda equina: Conus extends to the L2 level. Conus and cauda equina appear normal. Paraspinal and other soft tissues: Unremarkable. Disc levels: L1-2: Disc desiccation and mild disc space narrowing. Disc bulging without stenosis. L2-3: Negative. L3-4: Mild disc space narrowing. There is a new large right paracentral disc extrusion with superior migration  to the L3 pedicle level resulting in severe right lateral recess stenosis and right L3 nerve root impingement. Disc bulging and mild facet and ligamentum flavum hypertrophy result in mild bilateral neural foraminal stenosis. No significant generalized spinal stenosis. L4-5: Disc desiccation and moderate disc space narrowing. Disc bulging and mild-to-moderate facet and ligamentum flavum hypertrophy result in mild bilateral lateral recess stenosis and mild-to-moderate right neural foraminal stenosis, similar to the prior CT. No significant generalized spinal stenosis. L5-S1: Disc desiccation and severe left-sided  disc space narrowing. Left eccentric disc bulging and mild facet hypertrophy result in mild to moderate bilateral lateral recess stenosis and mild right and moderate to severe left neural foraminal stenosis, similar to the prior CT. No significant generalized spinal stenosis. S1-2: Transitional anatomy. Unchanged mild left neural foraminal stenosis due to spurring. No spinal stenosis. IMPRESSION: 1. New large right paracentral disc extrusion at L3-4 with right L3 nerve root impingement in the lateral recess. 2. Acute or subacute L4 compression fracture with 10% height loss. 3. 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 Electronically Signed   By: Logan Bores M.D.   On: 02/07/2021 16:19   MR HIP RIGHT WO CONTRAST  Result Date: 02/07/2021 CLINICAL DATA:  Hip pain, stress fracture suspected, neg xray. Discomfort in her right lower back buttock hip EXAM: MR OF THE RIGHT HIP WITHOUT CONTRAST TECHNIQUE: Multiplanar, multisequence MR imaging was performed. No intravenous contrast was administered. COMPARISON:  CT abdomen pelvis 06/23/2020 FINDINGS: Bones: No evidence of fracture. Focal reactive bony edema and cystic change in the right greater trochanter related to gluteal tendon pathology. Articular cartilage and labrum Articular cartilage:  Mild chondrosis. Labrum:  Degenerative superior  labral tearing. Joint or bursal effusion Joint effusion:  Small effusion, symmetric with the left hip. Bursae: Sub gluteus minimus Muscles and tendons Muscles and tendons: There are full-thickness gluteus medius and minimus tears with mild fluid in the subgluteus bursa at the greater trochanter. There is chronic gluteus medius and gluteus minimus muscle atrophy as seen on prior CT September 2021, with intramuscular edema in the residual muscle belly. There is intramuscular edema within the right pectineus muscle and abductor brevis muscle Mild tendinosis of the proximal hamstring tendons. Other findings Miscellaneous:   Prior hysterectomy. IMPRESSION: Full-thickness gluteus minimus and medius tendon tears with associated mild trochanteric bursitis. Atrophy of the gluteus minimus and medius muscles compatible with chronic tear. Intramuscular edema within the right hip adductor muscles and residual gluteus medius/minimus muscles compatible grade 1 muscle strain. 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 Electronically Signed   By: Maurine Simmering   On: 02/07/2021 16:33    Labs: Recent Labs    02/07/21 1131 02/08/21 0447  WBC 8.8 7.3  RBC 3.84* 3.82*  HCT 38.2 38.6  PLT PLATELET CLUMPS NOTED ON SMEAR, UNABLE TO ESTIMATE 155   Recent Labs    02/07/21 1131 02/08/21 0447  NA 136 138  K 3.8 4.2  CL 100 102  CO2 25 29  BUN 22 19  CREATININE 0.80 0.75  GLUCOSE 131* 132*  CALCIUM 9.0 9.2   Recent Labs    02/07/21 1131  INR 1.0    Review of Systems: ROS  Physical Exam: There is no height or weight on file to calculate BMI.  General: Alert and oriented 3, somewhat tearful.  Inspection: No obvious deformity  Dermatomes:  Patient complaining of pain in her right buttock lateral hip radiating into her anterior right thigh as well as pain in her groin and her medial thigh.  Motor: Plantar flexion and dorsiflexion intact.  Patient unable to flex her right hip on  her own.  I can passively flex her hip without any significant pain reproduced.  Patient able to do abductor and adductor her hip with some mild discomfort noted on adduction.  Peripheral vascular: Extremities warm and well perfused.  Distal sensation intact.  Palpable pulses.   Assessment and Plan: Large right-sided L3-4 disc herniation.  I do believe that this is  the cause of the patient's radicular right leg pain and hip flexor weakness.  She also has an acute/subacute L4 compression fracture and a right adductor strain.  While I do believe the adductor strain may be contributing to her medial thigh pain and discomfort with adduction and abduction this would not explain her weakness with hip flexion which is better explained by the large disc herniation and nerve root compression.  Discussed with the patient treatment options which may include injection therapy versus surgical intervention.  Given the size of the disc herniation and her significant hip flexion weakness I think the chances of her symptoms having significant improvement with an injection are low.  Surgical intervention would likely be an L3-4 decompression and discectomy and given the acute/subacute L4 compression fracture we would also likely do a kyphoplasty during the procedure while she is under anesthesia. Patient will have to be moved to [facility]  for this surgery if this is how we decide to move forward.   Recommend we hold her Lovenox in preparation for either an injection or surgical intervention. Will discuss further with medical team. Recommend Teds, SCDs for DVT ppx.  I will also start her on Robaxin for multimodal approach to pain.  Unfortunately she has been on gabapentin and not tolerated this well in the past.  Will discuss further with Dr. Rolena Infante for ongoing plan.  I did talk to the patient's daughter via speaker phone.  Appreciate medicine and oncology assistance on medical optimization and risk  stratification for this patient.    Nelson Chimes PA-C [facility]   Addendum dictation: By Dr. Rolena Infante  Agree with note and clinical exam entered by Nelson Chimes, PA. Imaging studies demonstrate an acute L3-4 disc herniation on the right side causing L3 and L4 neural compression.  Patient also has an acute L4 compression fracture secondary to age-related osteopenia.  Given the severity of her pain and the neurological deficits I do think moving forward with surgery at this point time is reasonable.  I have discussed the surgical procedure in great detail with the patient and her daughter.  Surgical plan: Right L3-4 hemilaminotomy and discectomy.  L4 kyphoplasty.  Imaging studies do demonstrate some foraminal stenosis at L5-S1 but it is larger than the left side and she has no radicular left leg pain so I do not think this is part of her current pain generator.  Risks of surgery include: Infection, bleeding, nerve damage, death, stroke, paralysis, ongoing or worse pain, need for additional surgery, additional fractures, blood clots, need for additional surgery including fusions, cerebral spinal fluid leak, major bleeding from abdominal vessels requiring emergent anterior abdominal surgery to stop bleeding.  The patient and her daughter both expressed an understanding of the risks, benefits and all their questions were addressed.  At this point time she expressed a willingness to move forward with surgery.  We will transfer the patient to [facility] this evening with a plan on moving forward with surgery tomorrow afternoon.  I will contact the hospitalist team to ensure that the patient is medically cleared for surgery.

## 2021-02-08 NOTE — Progress Notes (Signed)
PT Cancellation Note  Patient Details Name: KYLIE SIMMONDS MRN: [id number] DOB: 10/09/1941   Cancelled Treatment:    Reason Eval/Treat Not Completed: Medical issues which prohibited therapy--will hold PT for now per Ortho request. Will continue to follow and will proceed once cleared to by Ortho. Thanks.    [facility]  Office: [phone] Pager: [phone]

## 2021-02-08 NOTE — Progress Notes (Signed)
PT Cancellation Note  Patient Details Name: [name] MRN: [id number] DOB: [birth year]   Cancelled Treatment:    Reason Eval/Treat Not Completed:  Order received. Chart reviewed. Will await ortho consult and recommendations.    [facility]  Office: [phone] Pager: [phone]

## 2021-02-09 ENCOUNTER — Encounter (HOSPITAL_COMMUNITY)
Admission: EM | Disposition: A | Discharge status: Home / Self Care | Payer: Self-pay | Source: Home / Self Care | Attending: Internal Medicine

## 2021-02-09 ENCOUNTER — Inpatient Hospital Stay (HOSPITAL_COMMUNITY): Payer: Medicare HMO | Admitting: Certified Registered"

## 2021-02-09 ENCOUNTER — Telehealth: Payer: Self-pay | Admitting: Oncology

## 2021-02-09 ENCOUNTER — Encounter (HOSPITAL_COMMUNITY): Payer: Self-pay | Admitting: Internal Medicine

## 2021-02-09 ENCOUNTER — Inpatient Hospital Stay (HOSPITAL_COMMUNITY): Payer: Medicare HMO

## 2021-02-09 DIAGNOSIS — M5126 Other intervertebral disc displacement, lumbar region: Secondary | ICD-10-CM

## 2021-02-09 HISTORY — PX: DECOMPRESSIVE LUMBAR LAMINECTOMY LEVEL 1: SHX5791

## 2021-02-09 HISTORY — DX: Other intervertebral disc displacement, lumbar region: M51.26

## 2021-02-09 HISTORY — PX: KYPHOPLASTY: SHX5884

## 2021-02-09 LAB — GLUCOSE, CAPILLARY
Glucose-Capillary: 120 mg/dL — ABNORMAL HIGH (ref 70–99)
Glucose-Capillary: 123 mg/dL — ABNORMAL HIGH (ref 70–99)
Glucose-Capillary: 160 mg/dL — ABNORMAL HIGH (ref 70–99)

## 2021-02-09 SURGERY — DECOMPRESSIVE LUMBAR LAMINECTOMY LEVEL 1
Anesthesia: General | Site: Back

## 2021-02-09 MED ORDER — INSULIN ASPART 100 UNIT/ML ~~LOC~~ SOLN
0.0000 [IU] | Freq: Three times a day (TID) | SUBCUTANEOUS | Status: DC
  Administered 2021-02-10: 3 [IU] via SUBCUTANEOUS

## 2021-02-09 MED ORDER — LACTATED RINGERS IV SOLN: INTRAVENOUS | Status: DC

## 2021-02-09 MED ORDER — FENTANYL CITRATE (PF) 100 MCG/2ML IJ SOLN
INTRAMUSCULAR | Status: AC
  Filled 2021-02-09: qty 2

## 2021-02-09 MED ORDER — ACETAMINOPHEN 325 MG PO TABS: 650.0000 mg | ORAL_TABLET | ORAL | Status: DC | PRN

## 2021-02-09 MED ORDER — DEXAMETHASONE SODIUM PHOSPHATE 10 MG/ML IJ SOLN
INTRAMUSCULAR | Status: AC
  Filled 2021-02-09: qty 1

## 2021-02-09 MED ORDER — OXYCODONE HCL 5 MG PO TABS: 5.0000 mg | ORAL_TABLET | Freq: Once | ORAL | Status: DC | PRN

## 2021-02-09 MED ORDER — BUPIVACAINE LIPOSOME 1.3 % IJ SUSP
INTRAMUSCULAR | Status: AC
  Filled 2021-02-09: qty 20

## 2021-02-09 MED ORDER — VANCOMYCIN HCL 1000 MG/200ML IV SOLN
1000.0000 mg | Freq: Once | INTRAVENOUS | Status: DC
  Filled 2021-02-09: qty 200

## 2021-02-09 MED ORDER — LIDOCAINE 2% (20 MG/ML) 5 ML SYRINGE
INTRAMUSCULAR | Status: AC
  Filled 2021-02-09: qty 5

## 2021-02-09 MED ORDER — SUGAMMADEX SODIUM 200 MG/2ML IV SOLN
INTRAVENOUS | Status: DC | PRN
  Administered 2021-02-09: 300 mg via INTRAVENOUS

## 2021-02-09 MED ORDER — BUPIVACAINE-EPINEPHRINE (PF) 0.25% -1:200000 IJ SOLN
INTRAMUSCULAR | Status: DC | PRN
  Administered 2021-02-09: 8 mL via PERINEURAL

## 2021-02-09 MED ORDER — METHOCARBAMOL 500 MG PO TABS: 500.0000 mg | ORAL_TABLET | Freq: Three times a day (TID) | ORAL | 0 refills | Status: AC | PRN

## 2021-02-09 MED ORDER — ONDANSETRON HCL 4 MG/2ML IJ SOLN
INTRAMUSCULAR | Status: DC | PRN
  Administered 2021-02-09: 4 mg via INTRAVENOUS

## 2021-02-09 MED ORDER — PROPOFOL 10 MG/ML IV BOLUS
INTRAVENOUS | Status: AC
  Filled 2021-02-09: qty 20

## 2021-02-09 MED ORDER — ONDANSETRON HCL 4 MG/2ML IJ SOLN: 4.0000 mg | Freq: Four times a day (QID) | INTRAMUSCULAR | Status: DC | PRN

## 2021-02-09 MED ORDER — FENTANYL CITRATE (PF) 250 MCG/5ML IJ SOLN
INTRAMUSCULAR | Status: AC
  Filled 2021-02-09: qty 5

## 2021-02-09 MED ORDER — PROPOFOL 10 MG/ML IV BOLUS
INTRAVENOUS | Status: DC | PRN
  Administered 2021-02-09: 120 mg via INTRAVENOUS

## 2021-02-09 MED ORDER — SODIUM CHLORIDE 0.9 % IV SOLN: 250.0000 mL | INTRAVENOUS | Status: DC

## 2021-02-09 MED ORDER — ONDANSETRON HCL 4 MG/2ML IJ SOLN
INTRAMUSCULAR | Status: AC
  Filled 2021-02-09: qty 2

## 2021-02-09 MED ORDER — ACETAMINOPHEN 650 MG RE SUPP: 650.0000 mg | RECTAL | Status: DC | PRN

## 2021-02-09 MED ORDER — OXYCODONE HCL 5 MG PO TABS
5.0000 mg | ORAL_TABLET | ORAL | Status: DC | PRN
  Administered 2021-02-10: 5 mg via ORAL
  Filled 2021-02-09: qty 1

## 2021-02-09 MED ORDER — IOPAMIDOL (ISOVUE-300) INJECTION 61%
INTRAVENOUS | Status: DC | PRN
  Administered 2021-02-09: 50 mL

## 2021-02-09 MED ORDER — LINACLOTIDE 145 MCG PO CAPS
145.0000 ug | ORAL_CAPSULE | Freq: Every day | ORAL | Status: DC
  Administered 2021-02-10: 145 ug via ORAL
  Filled 2021-02-09 (×2): qty 1

## 2021-02-09 MED ORDER — THROMBIN 20000 UNITS EX SOLR
CUTANEOUS | Status: AC
  Filled 2021-02-09: qty 20000

## 2021-02-09 MED ORDER — VANCOMYCIN HCL 750 MG/150ML IV SOLN
750.0000 mg | Freq: Once | INTRAVENOUS | Status: AC
  Administered 2021-02-09: 750 mg via INTRAVENOUS
  Filled 2021-02-09: qty 150

## 2021-02-09 MED ORDER — METHOCARBAMOL 1000 MG/10ML IJ SOLN: 500.0000 mg | Freq: Four times a day (QID) | INTRAVENOUS | Status: DC | PRN

## 2021-02-09 MED ORDER — SODIUM CHLORIDE 0.9% FLUSH: 3.0000 mL | INTRAVENOUS | Status: DC | PRN

## 2021-02-09 MED ORDER — ROCURONIUM BROMIDE 10 MG/ML (PF) SYRINGE
PREFILLED_SYRINGE | INTRAVENOUS | Status: AC
  Filled 2021-02-09: qty 10

## 2021-02-09 MED ORDER — TRANEXAMIC ACID-NACL 1000-0.7 MG/100ML-% IV SOLN
INTRAVENOUS | Status: DC | PRN
  Administered 2021-02-09: 1000 mg via INTRAVENOUS

## 2021-02-09 MED ORDER — INSULIN ASPART 100 UNIT/ML ~~LOC~~ SOLN: 0.0000 [IU] | Freq: Every day | SUBCUTANEOUS | Status: DC

## 2021-02-09 MED ORDER — OXYCODONE HCL 5 MG PO TABS
10.0000 mg | ORAL_TABLET | ORAL | Status: DC | PRN
  Administered 2021-02-10 (×2): 10 mg via ORAL
  Filled 2021-02-09 (×2): qty 2

## 2021-02-09 MED ORDER — BUPIVACAINE-EPINEPHRINE (PF) 0.25% -1:200000 IJ SOLN
INTRAMUSCULAR | Status: AC
  Filled 2021-02-09: qty 30

## 2021-02-09 MED ORDER — MENTHOL 3 MG MT LOZG: 1.0000 | LOZENGE | OROMUCOSAL | Status: DC | PRN

## 2021-02-09 MED ORDER — PHENOL 1.4 % MT LIQD: 1.0000 | OROMUCOSAL | Status: DC | PRN

## 2021-02-09 MED ORDER — ONDANSETRON HCL 4 MG PO TABS: 4.0000 mg | ORAL_TABLET | Freq: Four times a day (QID) | ORAL | Status: DC | PRN

## 2021-02-09 MED ORDER — ONDANSETRON HCL 4 MG PO TABS: 4.0000 mg | ORAL_TABLET | Freq: Three times a day (TID) | ORAL | 0 refills | Status: DC | PRN

## 2021-02-09 MED ORDER — 0.9 % SODIUM CHLORIDE (POUR BTL) OPTIME
TOPICAL | Status: DC | PRN
  Administered 2021-02-09: 1000 mL

## 2021-02-09 MED ORDER — EPHEDRINE SULFATE-NACL 50-0.9 MG/10ML-% IV SOSY
PREFILLED_SYRINGE | INTRAVENOUS | Status: DC | PRN
  Administered 2021-02-09: 10 mg via INTRAVENOUS

## 2021-02-09 MED ORDER — DEXAMETHASONE SODIUM PHOSPHATE 10 MG/ML IJ SOLN
INTRAMUSCULAR | Status: DC | PRN
  Administered 2021-02-09: 5 mg via INTRAVENOUS

## 2021-02-09 MED ORDER — ONDANSETRON HCL 4 MG/2ML IJ SOLN: 4.0000 mg | Freq: Once | INTRAMUSCULAR | Status: DC | PRN

## 2021-02-09 MED ORDER — VANCOMYCIN HCL IN DEXTROSE 1-5 GM/200ML-% IV SOLN
INTRAVENOUS | Status: AC
  Filled 2021-02-09: qty 200

## 2021-02-09 MED ORDER — OXYCODONE-ACETAMINOPHEN 10-325 MG PO TABS: 1.0000 | ORAL_TABLET | Freq: Four times a day (QID) | ORAL | 0 refills | Status: AC | PRN

## 2021-02-09 MED ORDER — PHENYLEPHRINE 40 MCG/ML (10ML) SYRINGE FOR IV PUSH (FOR BLOOD PRESSURE SUPPORT)
PREFILLED_SYRINGE | INTRAVENOUS | Status: DC | PRN
  Administered 2021-02-09: 120 ug via INTRAVENOUS
  Administered 2021-02-09 (×2): 80 ug via INTRAVENOUS

## 2021-02-09 MED ORDER — THROMBIN 20000 UNITS EX SOLR
CUTANEOUS | Status: DC | PRN
  Administered 2021-02-09: 20000 [IU] via TOPICAL

## 2021-02-09 MED ORDER — ROCURONIUM BROMIDE 10 MG/ML (PF) SYRINGE
PREFILLED_SYRINGE | INTRAVENOUS | Status: DC | PRN
  Administered 2021-02-09: 20 mg via INTRAVENOUS
  Administered 2021-02-09: 40 mg via INTRAVENOUS
  Administered 2021-02-09 (×2): 20 mg via INTRAVENOUS

## 2021-02-09 MED ORDER — VANCOMYCIN HCL 1000 MG IV SOLR
INTRAVENOUS | Status: DC | PRN
  Administered 2021-02-09: 1000 mg via INTRAVENOUS

## 2021-02-09 MED ORDER — HEPARIN SOD (PORK) LOCK FLUSH 100 UNIT/ML IV SOLN
500.0000 [IU] | INTRAVENOUS | Status: AC | PRN
  Administered 2021-02-09: 500 [IU]
  Filled 2021-02-09: qty 5

## 2021-02-09 MED ORDER — TRANEXAMIC ACID-NACL 1000-0.7 MG/100ML-% IV SOLN
INTRAVENOUS | Status: AC
  Filled 2021-02-09: qty 100

## 2021-02-09 MED ORDER — SODIUM CHLORIDE 0.9% FLUSH: 3.0000 mL | Freq: Two times a day (BID) | INTRAVENOUS | Status: DC

## 2021-02-09 MED ORDER — FENTANYL CITRATE (PF) 100 MCG/2ML IJ SOLN
25.0000 ug | INTRAMUSCULAR | Status: DC | PRN
  Administered 2021-02-09 (×2): 25 ug via INTRAVENOUS
  Administered 2021-02-09: 50 ug via INTRAVENOUS

## 2021-02-09 MED ORDER — PHENYLEPHRINE HCL-NACL 10-0.9 MG/250ML-% IV SOLN
INTRAVENOUS | Status: DC | PRN
  Administered 2021-02-09: 25 ug/min via INTRAVENOUS

## 2021-02-09 MED ORDER — POLYETHYLENE GLYCOL 3350 17 G PO PACK: 17.0000 g | PACK | Freq: Every day | ORAL | Status: DC | PRN

## 2021-02-09 MED ORDER — CHLORHEXIDINE GLUCONATE 0.12 % MT SOLN
15.0000 mL | OROMUCOSAL | Status: AC
  Administered 2021-02-09: 15 mL via OROMUCOSAL
  Filled 2021-02-09: qty 15

## 2021-02-09 MED ORDER — MORPHINE SULFATE (PF) 2 MG/ML IV SOLN
1.0000 mg | INTRAVENOUS | Status: DC | PRN
Start: 2021-02-09 — End: 2021-02-10

## 2021-02-09 MED ORDER — FLEET ENEMA 7-19 GM/118ML RE ENEM: 1.0000 | ENEMA | Freq: Once | RECTAL | Status: DC | PRN

## 2021-02-09 MED ORDER — METHOCARBAMOL 500 MG PO TABS
500.0000 mg | ORAL_TABLET | Freq: Four times a day (QID) | ORAL | Status: DC | PRN
  Administered 2021-02-10 (×2): 500 mg via ORAL
  Filled 2021-02-09 (×2): qty 1

## 2021-02-09 MED ORDER — OXYCODONE HCL 5 MG/5ML PO SOLN: 5.0000 mg | Freq: Once | ORAL | Status: DC | PRN

## 2021-02-09 MED ORDER — FENTANYL CITRATE (PF) 100 MCG/2ML IJ SOLN
INTRAMUSCULAR | Status: DC | PRN
  Administered 2021-02-09: 50 ug via INTRAVENOUS
  Administered 2021-02-09: 100 ug via INTRAVENOUS

## 2021-02-09 MED ORDER — LIDOCAINE 2% (20 MG/ML) 5 ML SYRINGE
INTRAMUSCULAR | Status: DC | PRN
  Administered 2021-02-09: 60 mg via INTRAVENOUS

## 2021-02-09 SURGICAL SUPPLY — 74 items
BLADE SURG 15 STRL LF DISP TIS (BLADE) ×1 IMPLANT
BLADE SURG 15 STRL SS (BLADE) ×2
BNDG ADH 1X3 SHEER STRL LF (GAUZE/BANDAGES/DRESSINGS) ×4 IMPLANT
BNDG GAUZE ELAST 4 BULKY (GAUZE/BANDAGES/DRESSINGS) ×2 IMPLANT
CEMENT KYPHON CX01A KIT/MIXER (Cement) ×2 IMPLANT
CLSR STERI-STRIP ANTIMIC 1/2X4 (GAUZE/BANDAGES/DRESSINGS) ×1 IMPLANT
CORD BIPOLAR FORCEPS 12FT (ELECTRODE) ×2 IMPLANT
COVER MAYO STAND STRL (DRAPES) ×2 IMPLANT
COVER SURGICAL LIGHT HANDLE (MISCELLANEOUS) ×4 IMPLANT
COVER WAND RF STERILE (DRAPES) ×2 IMPLANT
CURETTE EXPRESS SZ2 7MM (INSTRUMENTS) IMPLANT
CURETTE WEDGE 8.5MM KYPHX (MISCELLANEOUS) IMPLANT
CURRETTE EXPRESS SZ2 7MM (INSTRUMENTS)
DERMABOND ADVANCED (GAUZE/BANDAGES/DRESSINGS) ×1
DERMABOND ADVANCED .7 DNX12 (GAUZE/BANDAGES/DRESSINGS) ×1 IMPLANT
DRAPE C-ARM 42X72 X-RAY (DRAPES) ×4 IMPLANT
DRAPE INCISE IOBAN 66X45 STRL (DRAPES) ×2 IMPLANT
DRAPE LAPAROTOMY T 102X78X121 (DRAPES) ×2 IMPLANT
DRAPE POUCH INSTRU U-SHP 10X18 (DRAPES) ×2 IMPLANT
DRAPE SURG 17X11 SM STRL (DRAPES) ×2 IMPLANT
DRAPE U-SHAPE 47X51 STRL (DRAPES) ×2 IMPLANT
DRAPE WARM FLUID 44X44 (DRAPES) ×2 IMPLANT
DRSG AQUACEL AG ADV 3.5X 4 (GAUZE/BANDAGES/DRESSINGS) ×2 IMPLANT
DRSG OPSITE POSTOP 4X6 (GAUZE/BANDAGES/DRESSINGS) ×1 IMPLANT
DURAPREP 26ML APPLICATOR (WOUND CARE) ×4 IMPLANT
ELECT BLADE 4.0 EZ CLEAN MEGAD (MISCELLANEOUS) ×2
ELECT PENCIL ROCKER SW 15FT (MISCELLANEOUS) ×2 IMPLANT
ELECT REM PT RETURN 9FT ADLT (ELECTROSURGICAL) ×2
ELECTRODE BLDE 4.0 EZ CLN MEGD (MISCELLANEOUS) ×1 IMPLANT
ELECTRODE REM PT RTRN 9FT ADLT (ELECTROSURGICAL) ×1 IMPLANT
GLOVE BIO SURGEON STRL SZ 6.5 (GLOVE) ×4 IMPLANT
GLOVE BIOGEL PI IND STRL 8.5 (GLOVE) ×1 IMPLANT
GLOVE BIOGEL PI INDICATOR 8.5 (GLOVE) ×1
GLOVE SS BIOGEL STRL SZ 8.5 (GLOVE) ×2 IMPLANT
GLOVE SUPERSENSE BIOGEL SZ 8.5 (GLOVE) ×2
GLOVE SURG UNDER POLY LF SZ6.5 (GLOVE) ×4 IMPLANT
GOWN STRL REUS W/ TWL LRG LVL3 (GOWN DISPOSABLE) ×3 IMPLANT
GOWN STRL REUS W/TWL 2XL LVL3 (GOWN DISPOSABLE) ×6 IMPLANT
GOWN STRL REUS W/TWL LRG LVL3 (GOWN DISPOSABLE) ×6
KIT BASIN OR (CUSTOM PROCEDURE TRAY) ×4 IMPLANT
KIT TURNOVER KIT B (KITS) ×2 IMPLANT
NDL SPNL 18GX3.5 QUINCKE PK (NEEDLE) ×2 IMPLANT
NDL SPNL 22GX3.5 QUINCKE BK (NEEDLE) ×1 IMPLANT
NEEDLE 22X1 1/2 (OR ONLY) (NEEDLE) ×2 IMPLANT
NEEDLE HYPO 22GX1.5 SAFETY (NEEDLE) ×2 IMPLANT
NEEDLE SPNL 18GX3.5 QUINCKE PK (NEEDLE) ×4 IMPLANT
NEEDLE SPNL 22GX3.5 QUINCKE BK (NEEDLE) ×2 IMPLANT
NS IRRIG 1000ML POUR BTL (IV SOLUTION) ×4 IMPLANT
PACK LAMINECTOMY ORTHO (CUSTOM PROCEDURE TRAY) ×2 IMPLANT
PACK SURGICAL SETUP 50X90 (CUSTOM PROCEDURE TRAY) ×2 IMPLANT
PACK UNIVERSAL I (CUSTOM PROCEDURE TRAY) ×2 IMPLANT
PAD ARMBOARD 7.5X6 YLW CONV (MISCELLANEOUS) ×4 IMPLANT
PATTIES SURGICAL .5 X.5 (GAUZE/BANDAGES/DRESSINGS) ×1 IMPLANT
PATTIES SURGICAL .5 X1 (DISPOSABLE) ×2 IMPLANT
SPONGE LAP 4X18 RFD (DISPOSABLE) ×2 IMPLANT
SPONGE SURGIFOAM ABS GEL 100 (HEMOSTASIS) ×2 IMPLANT
SURGIFLO W/THROMBIN 8M KIT (HEMOSTASIS) IMPLANT
SUT BONE WAX W31G (SUTURE) ×2 IMPLANT
SUT MNCRL AB 3-0 PS2 18 (SUTURE) ×2 IMPLANT
SUT MNCRL AB 3-0 PS2 27 (SUTURE) ×2 IMPLANT
SUT VIC AB 1 CT1 18XCR BRD 8 (SUTURE) IMPLANT
SUT VIC AB 1 CT1 27 (SUTURE) ×4
SUT VIC AB 1 CT1 27XBRD ANTBC (SUTURE) ×2 IMPLANT
SUT VIC AB 1 CT1 8-18 (SUTURE) ×2
SUT VIC AB 2-0 CT1 18 (SUTURE) ×4 IMPLANT
SUT VICRYL 0 UR6 27IN ABS (SUTURE) ×2 IMPLANT
SYR BULB IRRIG 60ML STRL (SYRINGE) ×2 IMPLANT
SYR CONTROL 10ML LL (SYRINGE) ×4 IMPLANT
TOWEL GREEN STERILE (TOWEL DISPOSABLE) ×6 IMPLANT
TRAY FOLEY MTR SLVR 16FR STAT (SET/KITS/TRAYS/PACK) IMPLANT
TRAY KYPHOPAK 15/3 ONESTEP 1ST (MISCELLANEOUS) ×1 IMPLANT
TRAY KYPHOPAK 20/3 ONESTEP 1ST (MISCELLANEOUS) IMPLANT
WATER STERILE IRR 1000ML POUR (IV SOLUTION) ×4 IMPLANT
YANKAUER SUCT BULB TIP NO VENT (SUCTIONS) ×2 IMPLANT

## 2021-02-09 NOTE — Anesthesia Preprocedure Evaluation (Signed)
Anesthesia Evaluation  Patient identified by MRN, date of birth, ID band Patient awake    Reviewed: Allergy & Precautions, NPO status , Patient's Chart, lab work & pertinent test results  Airway Mallampati: II  TM Distance: >3 FB Neck ROM: Full    Dental  (+) Teeth Intact, Dental Advisory Given   Pulmonary    breath sounds clear to auscultation       Cardiovascular hypertension,  Rhythm:Regular Rate:Normal     Neuro/Psych    GI/Hepatic   Endo/Other  diabetes  Renal/GU      Musculoskeletal   Abdominal   Peds  Hematology   Anesthesia Other Findings   Reproductive/Obstetrics                             Anesthesia Physical Anesthesia Plan  ASA: III  Anesthesia Plan: General   Post-op Pain Management:    Induction: Intravenous  PONV Risk Score and Plan: Ondansetron and Dexamethasone  Airway Management Planned: Oral ETT  Additional Equipment:   Intra-op Plan:   Post-operative Plan: Extubation in OR  Informed Consent: I have reviewed the patients History and Physical, chart, labs and discussed the procedure including the risks, benefits and alternatives for the proposed anesthesia with the patient or authorized representative who has indicated his/her understanding and acceptance.     Dental advisory given  Plan Discussed with: CRNA and Anesthesiologist  Anesthesia Plan Comments:         Anesthesia Quick Evaluation  

## 2021-02-09 NOTE — Anesthesia Procedure Notes (Signed)
Procedure Name: Intubation Date/Time: 02/09/2021 6:23 PM Performed by: Barrington Ellison, CRNA Pre-anesthesia Checklist: Patient identified, Emergency Drugs available, Suction available and Patient being monitored Patient Re-evaluated:Patient Re-evaluated prior to induction Oxygen Delivery Method: Circle System Utilized Preoxygenation: Pre-oxygenation with 100% oxygen Induction Type: IV induction Ventilation: Mask ventilation without difficulty Laryngoscope Size: Mac and 3 Grade View: Grade I Tube type: Oral Tube size: 7.0 mm Number of attempts: 1 Airway Equipment and Method: Stylet and Oral airway Placement Confirmation: ETT inserted through vocal cords under direct vision,  positive ETCO2 and breath sounds checked- equal and bilateral Secured at: 21 cm Tube secured with: Tape Dental Injury: Teeth and Oropharynx as per pre-operative assessment

## 2021-02-09 NOTE — Transfer of Care (Signed)
Immediate Anesthesia Transfer of Care Note  Patient: [name]  Procedure(s) Performed: DECOMPRESSIVE LUMBAR LAMINECTOMY LEVEL 1 lumbar three-four (N/A Back) KYPHOPLASTY lumbar four (N/A Back)  Patient Location: PACU  Anesthesia Type:General  Level of Consciousness: awake, alert  and oriented  Airway & Oxygen Therapy: Patient Spontanous Breathing  Post-op Assessment: Report given to RN and Post -op Vital signs reviewed and stable  Post vital signs: Reviewed and stable  Last Vitals:  Vitals Value Taken Time  BP    Temp    Pulse    Resp    SpO2      Last Pain:  Vitals:   02/09/21 1235  TempSrc: Oral  PainSc:       Patients Stated Pain Goal: 0 (37/85/88 5027)  Complications: No complications documented.

## 2021-02-09 NOTE — Op Note (Signed)
OPERATIVE REPORT  DATE OF SURGERY: 02/09/2021  PATIENT NAME:  [name] MRN: [id number] DOB: 1941-02-18  PCP: Raina Mina., MD  PRE-OPERATIVE DIAGNOSIS: Acute L3-4 disc herniation posterior lateral to the right.  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 L4 kyphoplasty  SURGEON:  Melina Schools, MD  PHYSICIAN ASSISTANT: Nelson Chimes, PA  ANESTHESIA:   General  EBL: 200 ml   BRIEF HISTORY: [name] is a 80 y.o. female who was in her usual state of good health until she started having a back pain radiating into the right leg.  Patient states she fell and started having severe pain.  The pain was so great she was ultimately admitted for pain control.  I was consulted to see her after the MRI was completed.  MRI showed an acute L3-4 disc herniation with cranial migration causing compression of the exiting L3 and traversing L4 nerve root.  In addition there was an L4 compression fracture with approximately 10% loss of anterior height.  As a result of her severe agonizing pain we elected to move forward with surgery.  All appropriate risks, benefits, alternatives to surgery were discussed and consent was obtained.  Patient expressed a willingness to move forward with surgery.  PROCEDURE DETAILS: Patient was brought into the operating room. After successful induction of general anesthesia and endotracheal intubation a Time Out was done. This confirmed all pertinent important data.  Patient was turned prone onto the Wilson frame.  All bony prominences were well-padded and the back was prepped and draped in a standard fashion.  2 needles were placed into the back and a fluoroscopic image was used to confirm the level.  Patient does have transitional anatomy and so I compared the fluoroscopy view with the preoperative MRI.  Identified the fracture levels L4 and the disc herniation level is L3-4.  I  marked the incision site and infiltrated with quarter percent Marcaine with epinephrine.  A midline incision was made and sharp dissection was carried out down to the deep tissue.  The deep fascia was sharply incised and I stripped the paraspinal muscles to expose the entire L3 spinous process and lamina as well as the L3-4 facet complex.  A Penfield 4 was placed underneath the L3 lamina and a second fluoroscopy view was taken to confirm I was at the appropriate level.  Once I confirmed I was at the appropriate level I performed a near complete hemilaminotomy on the right side of L3.  Given the location of the disc fragment I knew I would need to perform a significant hemilaminotomy in order to dissect up to the inferior aspect of the L3 pedicle to ensure that I had completely remove the disc fragment.  Once I had the laminotomy completed I then performed a medial facetectomy.  I then gently dissected through the ligamentum flavum and then remove this with a Kerrison rongeur.  At this point identify the L3-4 disc space and then began tracking superiorly.  Identified the large disc herniation in the posterior lateral corner.  There is a granular soft tissue consistency which I was able to remove.  There were fragments of bone also.  There were large epidural veins which I coagulated with bipolar electrocautery.  I could identify the L3 nerve root and retracted into the foramen.  I was able to identify the posterior aspect of the L3 vertebral body and palpate the L3 pedicle.  Based on the  preoperative MRI identified the location of where the disc fragment was and I removed all the free fragments.  The amount that I removed was consistent with what was seen on the preoperative MRI.  I then tracked back down to the disc space and created an annulotomy.  I removed any other loose fragments of disc material using the micropituitary rongeurs.  At this point I could now easily maneuvered the thecal sac and the L3 nerve  root.  There were no longer under compression.  I was also able to identify and mobilize the L4 nerve root and it was no longer under compression.  At this point I irrigated the wound copiously with normal saline and placed thrombin-soaked Gelfoam to aid with hemostasis.  Using the same incision I advanced the Jamshidi needle down to the lateral aspect of the L4 pedicle.  I confirmed satisfactory position in the AP and lateral planes using fluoroscopy.  I then advanced the Jamshidi needle into the right L4 pedicle.  As I advanced it into the pedicle I confirmed proper trajectory and positioning.  As I neared the medial wall of the pedicle on the AP view I confirmed that it was just beyond the posterior wall of the vertebral body in the lateral view.  Once I confirmed the trajectory I advanced into the vertebral body.  Using the same exact technique I cannulated the left L4 pedicle as well.  I then used the drill and then sounded the bony canal to ensure was solid.  I then inserted the inflatable bone tamps and inflated to the appropriate size.  I then deflated the bone tamps and inserted approximately 3 cc of cement on each side for a total of 6 cc.  The cement was allowed to harden.  Using fluoroscopy I confirmed satisfactory positioning of the cement in both the AP and lateral planes.  Once the cemented hardened I remove the Jamshidi needles and then irrigated the wound again copiously normal saline.  I then checked again in my surgical decompression bed to ensure that I had removed all fragments of disc material and there is no further neurocompression.  I took him a fluoroscopy view to confirm that I was able to palpate to just underneath the L3 pedicle all the way down to the L4 foramen.  This spanned the area of maximum compression as seen on the preoperative MRI.  After final irrigation I confirmed hemostasis with bipolar electrocautery.  The deep fascia was then closed with interrupted #1 Vicryl suture,  superficial 2-0 Vicryl suture, and then finally 3-0 Monocryl for the skin.  Steri-Strips and dry dressings were applied and the patient was ultimately extubated transfer the PACU without incident.  The end of the case all needle and sponge counts were correct.  There were no adverse intraoperative events.  Melina Schools, MD 02/09/2021 8:54 PM

## 2021-02-09 NOTE — Progress Notes (Signed)
Pharmacy Antibiotic Note  [name] is a 80 y.o. female admitted on 02/07/2021 with lumbar disc herniation, now s/p OR.  Pharmacy has been consulted for vancomcyin surgical px dosing.  Plan: Rec'd vanc 1g IV periop; will give vancomycin 750mg  IV in 12H x1 then d/c.  Height: 5\' 4"  (162.6 cm) Weight: 73.5 kg (162 lb 0.6 oz) IBW/kg (Calculated) : 54.7  Temp (24hrs), Avg:97.7 F (36.5 C), Min:97 F (36.1 C), Max:98.6 F (37 C)  Recent Labs  Lab 02/07/21 1131 02/08/21 0447  WBC 8.8 7.3  CREATININE 0.80 0.75    Estimated Creatinine Clearance: 55.1 mL/min (by C-G formula based on SCr of 0.75 mg/dL).    Allergies  Allergen Reactions  . Penicillins Anaphylaxis    Has patient had a PCN reaction causing immediate rash, facial/tongue/throat swelling, SOB or lightheadedness with hypotension: Yes Has patient had a PCN reaction causing severe rash involving mucus membranes or skin necrosis: No Has patient had a PCN reaction that required hospitalization: Yes Has patient had a PCN reaction occurring within the last 10 years: No If all of the above answers are "NO", then may proceed with Cephalosporin use.   . Rosuvastatin Itching    Possible. Possible.      Thank you for allowing pharmacy to be a part of this patient's care.  Wynona Neat, PharmD, BCPS  02/09/2021 11:13 PM

## 2021-02-09 NOTE — Progress Notes (Signed)
PT Cancellation Note  Patient Details Name: [name] MRN: [id number] DOB: [birth year]   Cancelled Treatment:    Reason Eval/Treat Not Completed: Patient not medically ready - going for lumbar surgery this afternoon, PT to check back.  Stacie Glaze, PT DPT Acute Rehabilitation Services Pager [phone]  Office [phone]    Louis Matte 02/09/2021, 8:59 AM

## 2021-02-09 NOTE — H&P (Signed)
Addendum H&P: Patient continues to have severe radicular right leg pain and moderate to severe back pain.  I have gone over the surgical procedure which is an L3-4 right-sided decompression/discectomy and L4 kyphoplasty.  She has expressed an understanding of the surgical procedure including the risks, benefits, alternatives.  All of her questions were addressed.  She is expressed a willingness to move forward with surgery.  There has been no change in her clinical exam since her evaluation yesterday evening.

## 2021-02-09 NOTE — Discharge Instructions (Signed)

## 2021-02-09 NOTE — Telephone Encounter (Signed)
[name] left a message saying that she is now at [facility] and is going to have surgery (decompressive lumbar laminectomy) today at 3:00 with Dr. Rolena Infante.  She wants to check with [name] before the surgery in case she has any suggestions.

## 2021-02-09 NOTE — Telephone Encounter (Signed)
Thanks for the update Please proceed with surgery

## 2021-02-09 NOTE — Progress Notes (Signed)
PROGRESS NOTE    [name]  LKG:[id number] DOB: May 18, 1941 DOA: 02/07/2021 PCP: Raina Mina., MD   Brief Narrative: This is a very pleasant 80 year old female who lives at home alone who loves to garden and plant trees.  She has history of hypertension hypothyroidism type 2 diabetes ovarian cancer.  She unloaded some mulch last week since then she has been experiencing severe right hip pain unable to walk or stand or move.  She was seen at the Ortho office yesterday with injection to her right hip without any improvement.  She was sent to the hospital from Ortho office for admission due to uncontrolled pain and for pain control.  Assessment & Plan:   Active Problems:   Right hip pain   Lumbosacral radiculopathy   Intractable pain  #1Intractable right hip/back pain-multifactorial etiology.  Patient complaining of 10 out of 10 pain receiving IV Dilaudid.  At baseline she is a very active woman does all the work at home gardening and planting trees.  She is very concerned about the pain. 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 MRI hip full-thickness gluteus minimus and medius tendon tear with associated mild trochanteric bursitis.  Dr. Rolena Infante of spine surgery will be performing microdiscectomy-appreciate input and defer further perioperative pain management and weightbearing status to him postoperatively  #2 hypothyroidism on Synthroid  #3 type 2 diabetes on SSI holding oral agents.  She is on glimepiride at home. CBG (last 3)  Recent Labs    02/08/21 2152 02/09/21 0625 02/09/21 1451  GLUCAP 136* 120* 123*     #4 essential hypertension-blood pressure 143/65.  On HCTZ and Norvasc.  #5 history of ovarian cancer informed to Dr. Simeon Craft such advised to hold oral chemo  and follow-up with her as an outpatient. Appreciate courtesy consult thank you for seeing her  #6 hyperlipidemia on Lipitor.  #7 depression on Prozac.  #8 chronic constipation restart Linzess.  Estimated body mass index is 27.81 kg/m as calculated from the following:   Height as of 01/11/21: 5\' 4"  (1.626 m).   Weight as of 01/11/21: 73.5 kg.  DVT prophylaxis: Lovenox on hold for possible surgery  code Status: Full code  family Communication: None at bedside Disposition Plan:  Status is: Observation   Dispo: The patient is from: Home              Anticipated d/c is to: Home              Patient currently is not medically stable to d/c.   Difficult to place patient No    Consultants: Ortho  Procedures: None Antimicrobials: None  Subjective:  Pain is reasonable She is ready for surgery later on today    Objective: Vitals:   02/08/21 2155 02/09/21 0354 02/09/21 0754 02/09/21 1235  BP: (!) 145/85 (!) 173/78 (!) 162/80 (!) 159/86  Pulse: 65 60 60 63  Resp: 18 18 16 16   Temp: 98.3 F (36.8 C) 97.9 F (36.6 C) 98.1 F (36.7 C) 98.6 F (37 C)  TempSrc: Oral Oral Oral Oral  SpO2: 100% 100% 99% 99%   No intake or output data in the 24 hours ending 02/09/21 1800 There were no vitals filed for this visit.  Examination:  Pleasant coherent no distress EOMI NCAT no focal deficit  No murmur Abdomen soft nontender no rebound no guarding Can move lower extremities with 5/5 power but limited on her right side Psychiatric euthymic    Data Reviewed: I have personally reviewed following labs and imaging studies  CBC: Recent Labs  Lab 02/07/21 1131 02/08/21 0447  WBC 8.8 7.3  NEUTROABS 6.8  --   HGB 12.9 12.9  HCT 38.2 38.6  MCV 99.5 101.0*  PLT PLATELET CLUMPS NOTED ON SMEAR, UNABLE TO ESTIMATE 301   Basic Metabolic Panel: Recent Labs  Lab 02/07/21 1131 02/08/21 0447  NA 136 138  K 3.8 4.2  CL 100 102  CO2 25 29  GLUCOSE 131* 132*  BUN 22 19  CREATININE  0.80 0.75  CALCIUM 9.0 9.2   GFR: CrCl cannot be calculated (Unknown ideal weight.). Liver Function Tests: Recent Labs  Lab 02/07/21 1131 02/08/21 0447  AST 33 27  ALT 43 36  ALKPHOS 55 53  BILITOT 1.3* 1.2  PROT 6.0* 6.1*  ALBUMIN 3.4* 3.3*   No results for input(s): LIPASE, AMYLASE in the last 168 hours. No results for input(s): AMMONIA in the last 168 hours. Coagulation Profile: Recent Labs  Lab 02/07/21 1131  INR 1.0   Cardiac Enzymes: No results for input(s): CKTOTAL, CKMB, CKMBINDEX, TROPONINI in the last 168 hours. BNP (last 3 results) No results for input(s): PROBNP in the last 8760 hours. HbA1C: Recent Labs    02/07/21 1131  HGBA1C 6.4*   CBG: Recent Labs  Lab 02/08/21 1118 02/08/21 1626 02/08/21 2152 02/09/21 0625 02/09/21 1451  GLUCAP 102* 138* 136* 120* 123*   Lipid Profile: No results for input(s): CHOL, HDL, LDLCALC, TRIG, CHOLHDL, LDLDIRECT in the last 72 hours. Thyroid Function Tests: No results for input(s): TSH, T4TOTAL, FREET4, T3FREE, THYROIDAB in the last 72 hours. Anemia Panel: No results for input(s): VITAMINB12, FOLATE, FERRITIN, TIBC, IRON, RETICCTPCT in the last 72 hours. Sepsis Labs: No results for input(s): PROCALCITON, LATICACIDVEN in the last 168 hours.  Recent Results (from the past 240 hour(s))  Resp Panel by RT-PCR (Flu A&B, Covid) Nasopharyngeal Swab     Status: None   Collection Time: 02/07/21  1:00 PM   Specimen: Nasopharyngeal Swab; Nasopharyngeal(NP) swabs in vial transport medium  Result Value Ref Range Status   SARS Coronavirus 2 by RT PCR NEGATIVE NEGATIVE Final    Comment: (NOTE) SARS-CoV-2 target nucleic acids are NOT DETECTED.  The SARS-CoV-2 RNA is generally detectable in upper respiratory specimens during the acute phase of infection. The lowest concentration of SARS-CoV-2 viral copies this assay can detect is 138 copies/mL. A negative result does not preclude SARS-Cov-2 infection and should not be used  as the sole basis for treatment or other patient management decisions. A negative result may occur with  improper specimen collection/handling, submission of specimen other than nasopharyngeal swab, presence of viral mutation(s) within the areas targeted by this assay, and inadequate number of viral copies(<138 copies/mL). A negative result must be combined with clinical observations, patient history, and epidemiological information. The expected result is Negative.  Fact Sheet for Patients:  EntrepreneurPulse.com.au  Fact Sheet for Healthcare Providers:  IncredibleEmployment.be  This test is no t yet approved or cleared by the [place] FDA and  has been authorized for detection and/or diagnosis of SARS-CoV-2 by FDA under an Emergency Use Authorization (EUA). This EUA will remain  in effect (meaning this test can be used) for the duration of the COVID-19 declaration under Section 564(b)(1) of the Act, 21 U.S.C.section 360bbb-3(b)(1), unless the  authorization is terminated  or revoked sooner.       Influenza A by PCR NEGATIVE NEGATIVE Final   Influenza B by PCR NEGATIVE NEGATIVE Final    Comment: (NOTE) The Xpert Xpress SARS-CoV-2/FLU/RSV plus assay is intended as an aid in the diagnosis of influenza from Nasopharyngeal swab specimens and should not be used as a sole basis for treatment. Nasal washings and aspirates are unacceptable for Xpert Xpress SARS-CoV-2/FLU/RSV testing.  Fact Sheet for Patients: EntrepreneurPulse.com.au  Fact Sheet for Healthcare Providers: IncredibleEmployment.be  This test is not yet approved or cleared by the [place] FDA and has been authorized for detection and/or diagnosis of SARS-CoV-2 by FDA under an Emergency Use Authorization (EUA). This EUA will remain in effect (meaning this test can be used) for the duration of the COVID-19 declaration under Section 564(b)(1)  of the Act, 21 U.S.C. section 360bbb-3(b)(1), unless the authorization is terminated or revoked.  Performed at [facility], [place] [address] [place], [place] [address]   Surgical PCR screen     Status: Abnormal   Collection Time: 02/08/21  7:55 PM   Specimen: Nasal Mucosa; Nasal Swab  Result Value Ref Range Status   MRSA, PCR NEGATIVE NEGATIVE Final   Staphylococcus aureus POSITIVE (A) NEGATIVE Final    Comment: (NOTE) The Xpert SA Assay (FDA approved for NASAL specimens in patients 61 years of age and older), is one component of a comprehensive surveillance program. It is not intended to diagnose infection nor to guide or monitor treatment. Performed at [facility], [place] [address], [place], [place] [address]       Radiology Studies: No results found.  Scheduled Meds: . [MAR Hold] amLODipine  5 mg Oral Daily  . [MAR Hold] atorvastatin  10 mg Oral Daily  . [MAR Hold] Chlorhexidine Gluconate Cloth  6 each Topical Daily  . [MAR Hold] FLUoxetine  20 mg Oral QPC breakfast  . [MAR Hold] hydrochlorothiazide  25 mg Oral Daily  . [MAR Hold] insulin aspart  0-9 Units Subcutaneous TID WC  . [MAR Hold] levothyroxine  112 mcg Oral QAC breakfast  . [MAR Hold] loratadine  10 mg Oral Daily  . [MAR Hold] losartan  100 mg Oral QHS  . [MAR Hold] mupirocin ointment  1 application Nasal BID   Continuous Infusions: . lactated ringers 10 mL/hr at 02/08/21 1716  . lactated ringers 75 mL/hr at 02/08/21 2302  . lactated ringers 10 mL/hr at 02/09/21 1607     LOS: 1 day     Nita Sells, MD [facility]  02/09/2021, 6:00 PM

## 2021-02-09 NOTE — Telephone Encounter (Signed)
Called Maricia back and advised her of message below.

## 2021-02-09 NOTE — Brief Op Note (Signed)
02/07/2021 - 02/09/2021  9:05 PM  PATIENT:  [name]  80 y.o. female  PRE-OPERATIVE DIAGNOSIS:  lumbar 3-4 disc herniation lumbar 4 compression fracture  POST-OPERATIVE DIAGNOSIS:  lumbar 3-4 disc herniation lumbar 4 compression fracture  PROCEDURE:  Procedure(s): DECOMPRESSIVE LUMBAR LAMINECTOMY LEVEL 1 lumbar three-four (N/A) KYPHOPLASTY lumbar four (N/A)  SURGEON:  Surgeon(s) and Role:    Melina Schools, MD - Primary  PHYSICIAN ASSISTANT: Nelson Chimes, PA  ANESTHESIA:   general  EBL:  200 mL   BLOOD ADMINISTERED:none  DRAINS: none   LOCAL MEDICATIONS USED:  MARCAINE     SPECIMEN:  No Specimen  DISPOSITION OF SPECIMEN:  N/A  COUNTS:  YES  TOURNIQUET:  * No tourniquets in log *  DICTATION: .Dragon Dictation  PLAN OF CARE: Admit to inpatient   PATIENT DISPOSITION:  PACU - hemodynamically stable.

## 2021-02-10 ENCOUNTER — Encounter (HOSPITAL_COMMUNITY): Payer: Self-pay | Admitting: Orthopedic Surgery

## 2021-02-10 LAB — CBC WITH DIFFERENTIAL/PLATELET
Abs Immature Granulocytes: 0.14 10*3/uL — ABNORMAL HIGH (ref 0.00–0.07)
Basophils Absolute: 0 10*3/uL (ref 0.0–0.1)
Basophils Relative: 0 %
Eosinophils Absolute: 0 10*3/uL (ref 0.0–0.5)
Eosinophils Relative: 0 %
HCT: 39.3 % (ref 36.0–46.0)
Hemoglobin: 13 g/dL (ref 12.0–15.0)
Immature Granulocytes: 1 %
Lymphocytes Relative: 7 %
Lymphs Abs: 0.9 10*3/uL (ref 0.7–4.0)
MCH: 33.3 pg (ref 26.0–34.0)
MCHC: 33.1 g/dL (ref 30.0–36.0)
MCV: 100.8 fL — ABNORMAL HIGH (ref 80.0–100.0)
Monocytes Absolute: 0.8 10*3/uL (ref 0.1–1.0)
Monocytes Relative: 6 %
Neutro Abs: 10.9 10*3/uL — ABNORMAL HIGH (ref 1.7–7.7)
Neutrophils Relative %: 86 %
Platelets: 168 10*3/uL (ref 150–400)
RBC: 3.9 MIL/uL (ref 3.87–5.11)
RDW: 15 % (ref 11.5–15.5)
WBC: 12.8 10*3/uL — ABNORMAL HIGH (ref 4.0–10.5)
nRBC: 0 % (ref 0.0–0.2)

## 2021-02-10 LAB — HEMOGLOBIN A1C
Hgb A1c MFr Bld: 6.3 % — ABNORMAL HIGH (ref 4.8–5.6)
Mean Plasma Glucose: 134.11 mg/dL

## 2021-02-10 LAB — GLUCOSE, CAPILLARY
Glucose-Capillary: 169 mg/dL — ABNORMAL HIGH (ref 70–99)
Glucose-Capillary: 148 mg/dL — ABNORMAL HIGH (ref 70–99)

## 2021-02-10 LAB — BASIC METABOLIC PANEL
Anion gap: 11 (ref 5–15)
BUN: 19 mg/dL (ref 8–23)
CO2: 22 mmol/L (ref 22–32)
Calcium: 8.8 mg/dL — ABNORMAL LOW (ref 8.9–10.3)
Chloride: 103 mmol/L (ref 98–111)
Creatinine, Ser: 1.16 mg/dL — ABNORMAL HIGH (ref 0.44–1.00)
GFR, Estimated: 48 mL/min — ABNORMAL LOW (ref >60)
Glucose, Bld: 123 mg/dL — ABNORMAL HIGH (ref 70–99)
Potassium: 3.7 mmol/L (ref 3.5–5.1)
Sodium: 136 mmol/L (ref 135–145)

## 2021-02-10 MED ORDER — ACETAMINOPHEN 500 MG PO TABS: 500.0000 mg | ORAL_TABLET | Freq: Four times a day (QID) | ORAL | 2 refills | Status: AC | PRN

## 2021-02-10 NOTE — Progress Notes (Signed)
Orthopedic Tech Progress Note Patient Details:  [name] 1941-09-19 [id number] Left back brace at bedside Ortho Devices Type of Ortho Device: Lumbar corsett Ortho Device/Splint Location: Back Ortho Device/Splint Interventions: Ordered       Jaqulyn Chancellor A Jayme Mednick 02/10/2021, 7:15 AM

## 2021-02-10 NOTE — Discharge Summary (Signed)
Physician Discharge Summary  MEG NIEMEIER QIH:[id number] DOB: Sep 23, 1941 DOA: 02/07/2021  PCP: Raina Mina., MD  Admit date: 02/07/2021 Discharge date: 02/10/2021  Time spent: 40 minutes  Recommendations for Outpatient Follow-up:  1. Outpatient follow-up as per Dr. Rolena Infante 2. Limited prescription of opiates prescribed on discharge 3. Need outpatient Chem-12, CBC in 1 week 4. Outpatient follow-up with Dr. Simeon Craft such will be CCed on this note  Discharge Diagnoses:  MAIN problem for hospitalization   Acute L3 disc herniation with radiculopathy  Please see below for itemized issues addressed in [place]- refer to other progress notes for clarity if needed  Discharge Condition: Improved  Diet recommendation: Heart healthy  Filed Weights   02/09/21 2302  Weight: 73.5 kg    History of present illness:  80 year old female highly active at home history of hypertension hypothyroid type 2 diabetes ovarian cancer followed by Dr. Rozetta Nunnery Developed severe hip pain and admitted to the hospital 4/25 Found to have large right paracentral disc extrusion L3-L4 with nerve impingement Also found to have full-thickness gluteus minimus medius tear with trochanteric bursitis Seen by Dr. Rolena Infante of orthopedics and transferred to [facility] Course:  L3-L4 nerve impingement Full-thickness gluteus minimus medius tear Underwent surgery under Dr. Rolena Infante 4/27 Will require lumbar brace Tylenol first choice for pain, oxycodone second choice  outpatient follow-up as per Dr. Rolena Infante DM TY 2 well-controlled glycemia this hospital stay Resume Amaryl on discharge History of ovarian cancer followed by Dr. Simeon Craft such Chemo can be resumed in the outpatient setting as per Dr. Simeon Craft such Depression, constipation, HTN were all stable during hospital stay    Procedures: PROCEDURE:   L3-4 hemilaminotomy for discectomy. L4 kyphoplasty  Consultations:  Dr. Rolena Infante  Discharge Exam: Vitals:    02/10/21 0755 02/10/21 1106  BP: 131/76 129/68  Pulse: 79 75  Resp: 18 18  Temp: 99 F (37.2 C) 98.2 F (36.8 C)  SpO2: 100% 100%    Subj on day of d/c   Awake alert coherent pleasant moves around fairly well  General Exam on discharge  EOMI NCAT no focal deficit S1-S2 no murmur Wearing thoracolumbar brace Power 5/5 in lower extremities although slightly weaker on the right side but much improved from prior to admission Able to extend knee Bruise over right patella  Discharge Instructions   Discharge Instructions    Diet - low sodium heart healthy   Complete by: As directed    Discharge instructions   Complete by: As directed    Make sure you take Tylenol as first choice for pain up to 5/10 pain level and you can take Percocet every 6 hours as needed for pain above 6/10  Robaxin can also be used for spasm that you may experience please follow-up with Dr. Rolena Infante as he will be the one to make a decision about your mobility and what you should or should not be doing Please wear lumbar corset at all times or as directed by him   Incentive spirometry RT   Complete by: As directed    Increase activity slowly   Complete by: As directed    No wound care   Complete by: As directed      Allergies as of 02/10/2021      Reactions   Penicillins Anaphylaxis   Has patient had a PCN reaction causing immediate rash, facial/tongue/throat swelling, SOB or lightheadedness with hypotension: Yes Has patient had a PCN reaction causing severe rash involving mucus membranes or skin necrosis:  No Has patient had a PCN reaction that required hospitalization: Yes Has patient had a PCN reaction occurring within the last 10 years: No If all of the above answers are "NO", then may proceed with Cephalosporin use.   Rosuvastatin Itching   Possible. Possible.      Medication List    STOP taking these medications   celecoxib 200 MG capsule Commonly known as: CELEBREX    HYDROcodone-acetaminophen 10-325 MG tablet Commonly known as: NORCO   HYDROcodone-acetaminophen 5-325 MG tablet Commonly known as: NORCO/VICODIN   lidocaine-prilocaine cream Commonly known as: EMLA   Rubraca 300 MG tablet Generic drug: rucaparib camsylate     TAKE these medications   acetaminophen 500 MG tablet Commonly known as: TYLENOL Take 1 tablet (500 mg total) by mouth every 6 (six) hours as needed for moderate pain.   ALPRAZolam 0.5 MG tablet Commonly known as: XANAX Take 0.5 mg by mouth at bedtime as needed for sleep.   amLODipine 5 MG tablet Commonly known as: NORVASC Take 5 mg by mouth daily.   atorvastatin 20 MG tablet Commonly known as: LIPITOR Take 10 mg by mouth daily.   Cyanocobalamin 1000 MCG/ML Kit Inject 1,000 mcg as directed every 14 (fourteen) days.   docusate sodium 100 MG capsule Commonly known as: COLACE Take 100 mg by mouth 2 (two) times daily.   FLUoxetine 20 MG capsule Commonly known as: PROZAC Take 20 mg by mouth daily after breakfast.   glimepiride 1 MG tablet Commonly known as: AMARYL Take 1 mg by mouth daily as needed. If CBG is elevated   hydrochlorothiazide 25 MG tablet Commonly known as: HYDRODIURIL Take 25 mg by mouth daily.   levothyroxine 112 MCG tablet Commonly known as: SYNTHROID Take 1 tablet (112 mcg total) by mouth daily before breakfast.   linaclotide 145 MCG Caps capsule Commonly known as: LINZESS Take 145 mcg by mouth daily before breakfast.   losartan 100 MG tablet Commonly known as: COZAAR Take 100 mg by mouth at bedtime.   melatonin 5 MG Tabs Take 5-10 mg by mouth at bedtime as needed (sleep).   metFORMIN 500 MG 24 hr tablet Commonly known as: GLUCOPHAGE-XR Take 1,000 mg by mouth 2 (two) times daily.   methocarbamol 500 MG tablet Commonly known as: Robaxin Take 1 tablet (500 mg total) by mouth every 8 (eight) hours as needed for up to 5 days for muscle spasms. What changed:   when to take  this  reasons to take this   ondansetron 4 MG tablet Commonly known as: Zofran Take 1 tablet (4 mg total) by mouth every 8 (eight) hours as needed for nausea or vomiting.   oxyCODONE-acetaminophen 10-325 MG tablet Commonly known as: Percocet Take 1 tablet by mouth every 6 (six) hours as needed for up to 5 days for pain.            Durable Medical Equipment  (From admission, onward)         Start     Ordered   02/10/21 1135  DME Cane  Once        02/10/21 1135         Allergies  Allergen Reactions  . Penicillins Anaphylaxis    Has patient had a PCN reaction causing immediate rash, facial/tongue/throat swelling, SOB or lightheadedness with hypotension: Yes Has patient had a PCN reaction causing severe rash involving mucus membranes or skin necrosis: No Has patient had a PCN reaction that required hospitalization: Yes Has patient had a PCN  reaction occurring within the last 10 years: No If all of the above answers are "NO", then may proceed with Cephalosporin use.   . Rosuvastatin Itching    Possible. Possible.     Follow-up Information    Melina Schools, MD. Schedule an appointment as soon as possible for a visit in 2 weeks.   Specialty: Orthopedic Surgery Why: For suture removal, For wound re-check, If symptoms worsen Contact information: [address] [place] [address] [phone]                The results of significant diagnostics from this hospitalization (including imaging, microbiology, ancillary and laboratory) are listed below for reference.    Significant Diagnostic Studies: DG Lumbar Spine 2-3 Views  Result Date: 02/09/2021 CLINICAL DATA:  Elective surgery. EXAM: LUMBAR SPINE - 2-3 VIEW; DG C-ARM 1-60 MIN COMPARISON:  Lumbar MRI 02/07/2021 FINDINGS: Five fluoroscopic spot views obtained in the operating room in frontal and lateral projections. There is transitional lumbosacral anatomy. Interval kyphoplasty of lower  thoracic vertebra, L4 if the transitional lumbosacral vertebra is considered S1. fluoroscopy time 2 minutes 10 seconds. Dose 50.87 mGy. IMPRESSION: Intraoperative fluoroscopy during kyphoplasty of lumbar vertebra. Electronically Signed   By: Keith Rake M.D.   On: 02/09/2021 21:18   MR LUMBAR SPINE WO CONTRAST  Result Date: 02/07/2021 CLINICAL DATA:  Right lower back, buttock, and hip pain radiating to the knee. EXAM: MRI LUMBAR SPINE WITHOUT CONTRAST TECHNIQUE: Multiplanar, multisequence MR imaging of the lumbar spine was performed. No intravenous contrast was administered. COMPARISON:  Noncontrast lumbar spine CT 09/01/2020 FINDINGS: Segmentation: Transitional lumbosacral anatomy with a Lumbarized S1. Alignment: Mild lumbar levoscoliosis. Unchanged trace retrolisthesis of L1 on L2. Vertebrae: L4 superior endplate compression fracture with mild edema and at most 10% vertebral body height loss. T12 superior endplate Schmorl's node. T12 and L5 superior endplate Schmorl's nodes no suspicious marrow lesion. Conus medullaris and cauda equina: Conus extends to the L2 level. Conus and cauda equina appear normal. Paraspinal and other soft tissues: Unremarkable. Disc levels: L1-2: Disc desiccation and mild disc space narrowing. Disc bulging without stenosis. L2-3: Negative. L3-4: Mild disc space narrowing. There is a new large right paracentral disc extrusion with superior migration to the L3 pedicle level resulting in severe right lateral recess stenosis and right L3 nerve root impingement. Disc bulging and mild facet and ligamentum flavum hypertrophy result in mild bilateral neural foraminal stenosis. No significant generalized spinal stenosis. L4-5: Disc desiccation and moderate disc space narrowing. Disc bulging and mild-to-moderate facet and ligamentum flavum hypertrophy result in mild bilateral lateral recess stenosis and mild-to-moderate right neural foraminal stenosis, similar to the prior CT. No  significant generalized spinal stenosis. L5-S1: Disc desiccation and severe left-sided disc space narrowing. Left eccentric disc bulging and mild facet hypertrophy result in mild to moderate bilateral lateral recess stenosis and mild right and moderate to severe left neural foraminal stenosis, similar to the prior CT. No significant generalized spinal stenosis. S1-2: Transitional anatomy. Unchanged mild left neural foraminal stenosis due to spurring. No spinal stenosis. IMPRESSION: 1. New large right paracentral disc extrusion at L3-4 with right L3 nerve root impingement in the lateral recess. 2. Acute or subacute L4 compression fracture with 10% height loss. 3. 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 Electronically Signed   By: Logan Bores M.D.   On: 02/07/2021 16:19   MR HIP RIGHT WO CONTRAST  Result Date: 02/07/2021 CLINICAL DATA:  Hip pain, stress fracture suspected, neg xray.  Discomfort in her right lower back buttock hip EXAM: MR OF THE RIGHT HIP WITHOUT CONTRAST TECHNIQUE: Multiplanar, multisequence MR imaging was performed. No intravenous contrast was administered. COMPARISON:  CT abdomen pelvis 06/23/2020 FINDINGS: Bones: No evidence of fracture. Focal reactive bony edema and cystic change in the right greater trochanter related to gluteal tendon pathology. Articular cartilage and labrum Articular cartilage:  Mild chondrosis. Labrum:  Degenerative superior labral tearing. Joint or bursal effusion Joint effusion:  Small effusion, symmetric with the left hip. Bursae: Sub gluteus minimus Muscles and tendons Muscles and tendons: There are full-thickness gluteus medius and minimus tears with mild fluid in the subgluteus bursa at the greater trochanter. There is chronic gluteus medius and gluteus minimus muscle atrophy as seen on prior CT September 2021, with intramuscular edema in the residual muscle belly. There is intramuscular edema within the right pectineus muscle and abductor  brevis muscle Mild tendinosis of the proximal hamstring tendons. Other findings Miscellaneous:   Prior hysterectomy. IMPRESSION: Full-thickness gluteus minimus and medius tendon tears with associated mild trochanteric bursitis. Atrophy of the gluteus minimus and medius muscles compatible with chronic tear. Intramuscular edema within the right hip adductor muscles and residual gluteus medius/minimus muscles compatible grade 1 muscle strain. 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 Electronically Signed   By: Maurine Simmering   On: 02/07/2021 16:33   DG C-Arm 1-60 Min  Result Date: 02/09/2021 CLINICAL DATA:  Elective surgery. EXAM: LUMBAR SPINE - 2-3 VIEW; DG C-ARM 1-60 MIN COMPARISON:  Lumbar MRI 02/07/2021 FINDINGS: Five fluoroscopic spot views obtained in the operating room in frontal and lateral projections. There is transitional lumbosacral anatomy. Interval kyphoplasty of lower thoracic vertebra, L4 if the transitional lumbosacral vertebra is considered S1. fluoroscopy time 2 minutes 10 seconds. Dose 50.87 mGy. IMPRESSION: Intraoperative fluoroscopy during kyphoplasty of lumbar vertebra. Electronically Signed   By: Keith Rake M.D.   On: 02/09/2021 21:18    Microbiology: Recent Results (from the past 240 hour(s))  Resp Panel by RT-PCR (Flu A&B, Covid) Nasopharyngeal Swab     Status: None   Collection Time: 02/07/21  1:00 PM   Specimen: Nasopharyngeal Swab; Nasopharyngeal(NP) swabs in vial transport medium  Result Value Ref Range Status   SARS Coronavirus 2 by RT PCR NEGATIVE NEGATIVE Final    Comment: (NOTE) SARS-CoV-2 target nucleic acids are NOT DETECTED.  The SARS-CoV-2 RNA is generally detectable in upper respiratory specimens during the acute phase of infection. The lowest concentration of SARS-CoV-2 viral copies this assay can detect is 138 copies/mL. A negative result does not preclude SARS-Cov-2 infection and should not be used as the sole basis for  treatment or other patient management decisions. A negative result may occur with  improper specimen collection/handling, submission of specimen other than nasopharyngeal swab, presence of viral mutation(s) within the areas targeted by this assay, and inadequate number of viral copies(<138 copies/mL). A negative result must be combined with clinical observations, patient history, and epidemiological information. The expected result is Negative.  Fact Sheet for Patients:  EntrepreneurPulse.com.au  Fact Sheet for Healthcare Providers:  IncredibleEmployment.be  This test is no t yet approved or cleared by the [place] FDA and  has been authorized for detection and/or diagnosis of SARS-CoV-2 by FDA under an Emergency Use Authorization (EUA). This EUA will remain  in effect (meaning this test can be used) for the duration of the COVID-19 declaration under Section 564(b)(1) of the Act, 21 U.S.C.section 360bbb-3(b)(1), unless the authorization is terminated  or revoked  sooner.       Influenza A by PCR NEGATIVE NEGATIVE Final   Influenza B by PCR NEGATIVE NEGATIVE Final    Comment: (NOTE) The Xpert Xpress SARS-CoV-2/FLU/RSV plus assay is intended as an aid in the diagnosis of influenza from Nasopharyngeal swab specimens and should not be used as a sole basis for treatment. Nasal washings and aspirates are unacceptable for Xpert Xpress SARS-CoV-2/FLU/RSV testing.  Fact Sheet for Patients: EntrepreneurPulse.com.au  Fact Sheet for Healthcare Providers: IncredibleEmployment.be  This test is not yet approved or cleared by the [place] FDA and has been authorized for detection and/or diagnosis of SARS-CoV-2 by FDA under an Emergency Use Authorization (EUA). This EUA will remain in effect (meaning this test can be used) for the duration of the COVID-19 declaration under Section 564(b)(1) of the Act, 21  U.S.C. section 360bbb-3(b)(1), unless the authorization is terminated or revoked.  Performed at [facility], [place] [address], [place], [place] [address]   Surgical PCR screen     Status: Abnormal   Collection Time: 02/08/21  7:55 PM   Specimen: Nasal Mucosa; Nasal Swab  Result Value Ref Range Status   MRSA, PCR NEGATIVE NEGATIVE Final   Staphylococcus aureus POSITIVE (A) NEGATIVE Final    Comment: (NOTE) The Xpert SA Assay (FDA approved for NASAL specimens in patients 60 years of age and older), is one component of a comprehensive surveillance program. It is not intended to diagnose infection nor to guide or monitor treatment. Performed at [facility], [place] [address] [place], [place] [address]      Labs: Basic Metabolic Panel: Recent Labs  Lab 02/07/21 1131 02/08/21 0447 02/10/21 0849  NA 136 138 136  K 3.8 4.2 3.7  CL 100 102 103  CO2 _0 GLUCOSE 131* 132* 123*  BUN _1 CREATININE 0.80 0.75 1.16*  CALCIUM 9.0 9.2 8.8*   Liver Function Tests: Recent Labs  Lab 02/07/21 1131 02/08/21 0447  AST 33 27  ALT 43 36  ALKPHOS 55 53  BILITOT 1.3* 1.2  PROT 6.0* 6.1*  ALBUMIN 3.4* 3.3*   No results for input(s): LIPASE, AMYLASE in the last 168 hours. No results for input(s): AMMONIA in the last 168 hours. CBC: Recent Labs  Lab 02/07/21 1131 02/08/21 0447 02/10/21 0849  WBC 8.8 7.3 12.8*  NEUTROABS 6.8  --  10.9*  HGB 12.9 12.9 13.0  HCT 38.2 38.6 39.3  MCV 99.5 101.0* 100.8*  PLT PLATELET CLUMPS NOTED ON SMEAR, UNABLE TO ESTIMATE 155 168   Cardiac Enzymes: No results for input(s): CKTOTAL, CKMB, CKMBINDEX, TROPONINI in the last 168 hours. BNP: BNP (last 3 results) No results for input(s): BNP in the last 8760 hours.  ProBNP (last 3 results) No results for input(s): PROBNP in the last 8760 hours.  CBG: Recent Labs  Lab 02/08/21 2152 02/09/21 0625 02/09/21 1451 02/09/21 2126 02/10/21 0557  GLUCAP  136* 120* 123* 160* 169*       Signed:  Nita Sells MD   [facility] 02/10/2021, 11:35 AM

## 2021-02-10 NOTE — Anesthesia Postprocedure Evaluation (Signed)
Anesthesia Post Note  Patient: IREENE BALLOWE  Procedure(s) Performed: DECOMPRESSIVE LUMBAR LAMINECTOMY LEVEL 1 lumbar three-four (N/A Back) KYPHOPLASTY lumbar four (N/A Back)     Patient location during evaluation: PACU Anesthesia Type: General Level of consciousness: awake and alert Pain management: pain level controlled Vital Signs Assessment: post-procedure vital signs reviewed and stable Respiratory status: spontaneous breathing, nonlabored ventilation, respiratory function stable and patient connected to nasal cannula oxygen Cardiovascular status: blood pressure returned to baseline and stable Postop Assessment: no apparent nausea or vomiting Anesthetic complications: no   No complications documented.  Last Vitals:  Vitals:   02/09/21 2302 02/10/21 0319  BP: 140/60 133/71  Pulse: (!) 103 76  Resp: 18 18  Temp: 36.6 C 36.7 C  SpO2: 99% 99%    Last Pain:  Vitals:   02/10/21 0541  TempSrc:   PainSc: 7                  Darcus Edds S

## 2021-02-10 NOTE — Evaluation (Signed)
Physical Therapy Evaluation Patient Details Name: [name] MRN: [id number] DOB: 04/01/1941 Today's Date: 02/10/2021   History of Present Illness  80 yo female admitted to ED on 4/25 with R hip and R LBP after unloading mulch last week. MRI lumbar spine shows acute R paracentral disc extrusion at L3-4 with R L3 nerve root impingement, L4 compression fracture, chronic L foraminal stenosis L5-S1. MRI hip shows R full thickness gluteus minimus and medius tear with mild trochanteric bursitis. s/p L3-4 hemilaminotomy for discectomy, L4 kyphoplasty on 4/27. PMH includes HTN, hypothyroidism, DM2, history of ovarian cancer, arthritis, L TKR 2016, salpingo oopherectomy 01/2018.  Clinical Impression   Pt presents with decreased knowledge and application of spinal precautions, impaired standing balance, moderate post-operative back pain, and decreased activity tolerance vs baseline. Pt to benefit from acute PT to address deficits. Pt ambulated hallway distance with close guard for safety, PT cuing pt on form and safety s/p back surgery. Pt states she will have assist from daughter at home, but unsure if this will be 24/7 as pt is slightly vague regarding details. PT recommending supervision for all OOB at this time given risk for falls and importance of safety post-operatively. PT to progress mobility as tolerated, and will continue to follow acutely.      Follow Up Recommendations Follow surgeon's recommendation for DC plan and follow-up therapies;Supervision for mobility/OOB (OPPT when cleared by surgeon, for post-op back and LE weakness)    Equipment Recommendations  None recommended by PT    Recommendations for Other Services       Precautions / Restrictions Precautions Precautions: Back Precaution Booklet Issued: Yes (comment) Precaution Comments: BLT rules, log roll technique in/out of bed Required Braces or Orthoses: Spinal Brace Spinal Brace: Lumbar corset;Applied in standing  position Restrictions Weight Bearing Restrictions: No      Mobility  Bed Mobility Overal bed mobility: Needs Assistance Bed Mobility: Rolling;Sidelying to Sit Rolling: Supervision Sidelying to sit: Supervision       General bed mobility comments: Pt up in chair upon PT arrival, performing log roll technique with OT    Transfers Overall transfer level: Needs assistance Equipment used: None Transfers: Sit to/from Stand Sit to Stand: Min guard         General transfer comment: for safety, verbal cuing to bend from hips/knees when sitting<>standing  Ambulation/Gait Ambulation/Gait assistance: Min guard Gait Distance (Feet): 250 Feet Assistive device: None Gait Pattern/deviations: Step-through pattern;Decreased stride length;Wide base of support;Antalgic     General Gait Details: close guard for safety, pt with R foot navicular drop with associated out-toeing and knee valgus, per pt this is baseline. Mildly unsteady, PT cuing pt to slow down for safety.  Stairs Stairs: Yes Stairs assistance: Min guard Stair Management: One rail Left;Step to pattern;Forwards Number of Stairs: 3 General stair comments: min guard for safety, verbal cuing for sequencing (up with good L leg leading, down with bad R leg leading), step-to sequencing  Wheelchair Mobility    Modified Rankin (Stroke Patients Only)       Balance Overall balance assessment: Mild deficits observed, not formally tested;History of Falls                                           Pertinent Vitals/Pain Pain Assessment: Faces Faces Pain Scale: Hurts little more Pain Location: back, incisional Pain Descriptors / Indicators: Discomfort;Sore Pain Intervention(s): Limited activity  within patient's tolerance;Monitored during session;Repositioned    Home Living Family/patient expects to be discharged to:: Private residence Living Arrangements: Alone Available Help at Discharge: Family;Available  24 hours/day Type of Home: House Home Access: Stairs to enter Entrance Stairs-Rails: Left Entrance Stairs-Number of Steps: 2 Home Layout: One level Home Equipment: Walker - 2 wheels;Bedside commode;Cane - single point      Prior Function Level of Independence: Independent         Comments: pt enjoys having a big garden, but requires stooping down and flexing trunk. Pt states "I won't be able to do it this year" after PT explaining back precautions     Hand Dominance   Dominant Hand: Right    Extremity/Trunk Assessment   Upper Extremity Assessment Upper Extremity Assessment: Defer to OT evaluation    Lower Extremity Assessment Lower Extremity Assessment: Generalized weakness;RLE deficits/detail RLE Deficits / Details: navicular drop in standing, knee valgus, bilateral hip weakness R>L suspect worsened by glute med/min tear    Cervical / Trunk Assessment Cervical / Trunk Assessment: Other exceptions Cervical / Trunk Exceptions: s/p back sx  Communication   Communication: No difficulties  Cognition Arousal/Alertness: Awake/alert Behavior During Therapy: WFL for tasks assessed/performed Overall Cognitive Status: Within Functional Limits for tasks assessed                                        General Comments General comments (skin integrity, edema, etc.): bandage in place    Exercises Other Exercises Other Exercises: PT educated pt on home walking program, up and walking 1x/hour for short household distances with supervision of family/friends. To reduce back stiffness, maintain strength, and promote circulation   Assessment/Plan    PT Assessment Patient needs continued PT services  PT Problem List Decreased strength;Decreased mobility;Decreased activity tolerance;Decreased balance;Pain;Decreased safety awareness;Decreased knowledge of precautions       PT Treatment Interventions DME instruction;Therapeutic activities;Gait training;Patient/family  education;Therapeutic exercise;Stair training;Balance training;Functional mobility training;Neuromuscular re-education    PT Goals (Current goals can be found in the Care Plan section)  Acute Rehab PT Goals Patient Stated Goal: Go home PT Goal Formulation: With patient Time For Goal Achievement: 02/17/21 Potential to Achieve Goals: Good    Frequency Min 5X/week   Barriers to discharge        Co-evaluation               AM-PAC PT "6 Clicks" Mobility  Outcome Measure Help needed turning from your back to your side while in a flat bed without using bedrails?: None Help needed moving from lying on your back to sitting on the side of a flat bed without using bedrails?: None Help needed moving to and from a bed to a chair (including a wheelchair)?: A Little Help needed standing up from a chair using your arms (e.g., wheelchair or bedside chair)?: A Little Help needed to walk in hospital room?: A Little Help needed climbing 3-5 steps with a railing? : A Little 6 Click Score: 20    End of Session Equipment Utilized During Treatment: Back brace Activity Tolerance: Patient limited by fatigue Patient left: in chair;with call bell/phone within reach Nurse Communication: Mobility status PT Visit Diagnosis: Other abnormalities of gait and mobility (R26.89);Difficulty in walking, not elsewhere classified (R26.2)    Time: 6160-7371 PT Time Calculation (min) (ACUTE ONLY): 17 min   Charges:   PT Evaluation $PT Eval Low Complexity: 1 Low  Stacie Glaze, PT DPT Acute Rehabilitation Services Pager [phone]  Office [phone]   Louis Matte 02/10/2021, 12:17 PM

## 2021-02-10 NOTE — Plan of Care (Signed)
Patient alert and oriented, voiding adequately, skin clean, dry and intact without evidence of skin break down, or symptoms of complications - no redness or edema noted, only slight tenderness at site.  Patient states pain is manageable at time of discharge. Patient has an appointment with MD in 2 weeks 

## 2021-02-10 NOTE — Evaluation (Signed)
Occupational Therapy Evaluation Patient Details Name: [name] MRN: [id number] DOB: Jul 15, 1941 Today's Date: 02/10/2021    History of Present Illness 80 yo female admitted to ED on 4/25 with R hip and R LBP after unloading mulch last week. MRI lumbar spine shows acute R paracentral disc extrusion at L3-4 with R L3 nerve root impingement, L4 compression fracture, chronic L foraminal stenosis L5-S1. MRI hip shows full thickness gluteus minimus and medius tear with mild trochanteric bursitis. s/p L3-4 hemilaminotomy for discectomy, L4 kyphoplasty on 4/27. PMH includes HTN, hypothyroidism, DM2, history of ovarian cancer, arthritis, L TKR 2016, salpingo oopherectomy 01/2018.   Clinical Impression   PTA, pt was living alone and was independent; enjoys gardening. Currently, pt requires Supervision-Min Guard A for ADLs and functional mobility. Provided education on back precautions, bed mobility, brace management, LB ADLs, toileting with AE, and tub transfer with shower seat; pt demonstrated understanding. Answered all pt questions. Recommend dc home once medically stable per physician. All acute OT needs met and will sign off. Thank you.    Follow Up Recommendations  No OT follow up;Supervision - Intermittent    Equipment Recommendations  None recommended by OT    Recommendations for Other Services PT consult     Precautions / Restrictions Precautions Precautions: Back Precaution Booklet Issued: Yes (comment) Precaution Comments: Reviewed back precautions Required Braces or Orthoses: Spinal Brace Spinal Brace: Lumbar corset;Applied in sitting position      Mobility Bed Mobility Overal bed mobility: Needs Assistance Bed Mobility: Rolling;Sidelying to Sit Rolling: Supervision Sidelying to sit: Supervision       General bed mobility comments: Pt performing log roll techniques with Supervision for safety    Transfers Overall transfer level: Needs assistance   Transfers: Sit  to/from Stand Sit to Stand: Min guard         General transfer comment: min guard a for safety    Balance Overall balance assessment: Mild deficits observed, not formally tested                                         ADL either performed or assessed with clinical judgement   ADL Overall ADL's : Needs assistance/impaired Eating/Feeding: Set up;Sitting   Grooming: Set up;Sitting Grooming Details (indicate cue type and reason): educating pt on oral care techniques to maintain back preacutions Upper Body Bathing: Supervision/ safety;Set up;Sitting   Lower Body Bathing: Min guard;Sit to/from stand   Upper Body Dressing : Set up;Supervision/safety;Sitting   Lower Body Dressing: Min guard;Sit to/from stand   Toilet Transfer: Min guard;Ambulation (simulated to recliner)     Toileting - Clothing Manipulation Details (indicate cue type and reason): educating pt on techniques and AE for toilet hygiene Tub/ Shower Transfer: Min guard;Tub transfer Tub/Shower Transfer Details (indicate cue type and reason): Pt performing tub transfer with MIn Guard A Functional mobility during ADLs: Min guard General ADL Comments: Providing education on back precautions, brace management, LB ADLs, grooming, toilting, and tub transfer     Vision Baseline Vision/History: Wears glasses Wears Glasses: At all times Patient Visual Report: No change from baseline       Perception     Praxis      Pertinent Vitals/Pain Pain Assessment: Faces Faces Pain Scale: Hurts little more Pain Descriptors / Indicators: Constant;Discomfort;Grimacing Pain Intervention(s): Monitored during session;Limited activity within patient's tolerance;Repositioned     Hand Dominance Right   Extremity/Trunk  Assessment Upper Extremity Assessment Upper Extremity Assessment: Overall WFL for tasks assessed   Lower Extremity Assessment Lower Extremity Assessment: Defer to PT evaluation   Cervical / Trunk  Assessment Cervical / Trunk Assessment: Other exceptions Cervical / Trunk Exceptions: s/p back sx   Communication Communication Communication: No difficulties   Cognition Arousal/Alertness: Awake/alert Behavior During Therapy: WFL for tasks assessed/performed Overall Cognitive Status: Within Functional Limits for tasks assessed                                     General Comments  bandage in place    Exercises     Shoulder Instructions      Home Living Family/patient expects to be discharged to:: Private residence Living Arrangements: Alone Available Help at Discharge: Family;Available 24 hours/day Type of Home: House Home Access: Stairs to enter [employer] of Steps: 2 Entrance Stairs-Rails: Left Home Layout: One level     Bathroom Shower/Tub: Teacher, early years/pre: Standard     Home Equipment: Environmental consultant - 2 wheels;Bedside commode;Cane - single point          Prior Functioning/Environment Level of Independence: Independent                 OT Problem List: Decreased strength;Decreased range of motion;Impaired balance (sitting and/or standing);Decreased activity tolerance;Decreased knowledge of use of DME or AE;Decreased knowledge of precautions;Decreased safety awareness;Pain      OT Treatment/Interventions:      OT Goals(Current goals can be found in the care plan section) Acute Rehab OT Goals Patient Stated Goal: Go home OT Goal Formulation: All assessment and education complete, DC therapy  OT Frequency:     Barriers to D/C:            Co-evaluation              AM-PAC OT "6 Clicks" Daily Activity     Outcome Measure Help from another person eating meals?: None Help from another person taking care of personal grooming?: None Help from another person toileting, which includes using toliet, bedpan, or urinal?: A Little Help from another person bathing (including washing, rinsing, drying)?: A Little Help  from another person to put on and taking off regular upper body clothing?: None Help from another person to put on and taking off regular lower body clothing?: A Little 6 Click Score: 21   End of Session Equipment Utilized During Treatment: Back brace Nurse Communication: Mobility status  Activity Tolerance: Patient tolerated treatment well Patient left: in bed;with call bell/phone within reach (EOB)  OT Visit Diagnosis: Unsteadiness on feet (R26.81);Other abnormalities of gait and mobility (R26.89);Muscle weakness (generalized) (M62.81);Pain Pain - part of body:  (Back)                Time: 5638-9373 OT Time Calculation (min): 18 min Charges:  OT General Charges $OT Visit: 1 Visit OT Evaluation $OT Eval Low Complexity: [place], OTR/L Acute Rehab Pager: [phone] Office: [place] 02/10/2021, 11:08 AM

## 2021-02-10 NOTE — Progress Notes (Signed)
    Subjective: Procedure(s) (LRB): DECOMPRESSIVE LUMBAR LAMINECTOMY LEVEL 1 lumbar three-four (N/A) KYPHOPLASTY lumbar four (N/A) 1 Day Post-Op  Patient reports pain as 1 on 0-10 scale.  Reports decreased leg pain reports incisional back pain   Positive void Negative bowel movement Positive flatus Negative chest pain or shortness of breath  Objective: Vital signs in last 24 hours: Temp:  [97 F (36.1 C)-99 F (37.2 C)] 98.2 F (36.8 C) (04/28 1106) Pulse Rate:  [70-103] 75 (04/28 1106) Resp:  [6-19] 18 (04/28 1106) BP: (129-147)/(60-76) 129/68 (04/28 1106) SpO2:  [94 %-100 %] 100 % (04/28 1106) Weight:  [73.5 kg] 73.5 kg (04/27 2302)  Intake/Output from previous day: 04/27 0701 - 04/28 0700 In: 1710 [P.O.:60; I.V.:1400; IV Piggyback:250] Out: 200 [Blood:200]  Labs: Recent Labs    02/08/21 0447 02/10/21 0849  WBC 7.3 12.8*  RBC 3.82* 3.90  HCT 38.6 39.3  PLT 155 168   Recent Labs    02/08/21 0447 02/10/21 0849  NA 138 136  K 4.2 3.7  CL 102 103  CO2 29 22  BUN 19 19  CREATININE 0.75 1.16*  GLUCOSE 132* 123*  CALCIUM 9.2 8.8*   No results for input(s): LABPT, INR in the last 72 hours.  Physical Exam: Neurologically intact ABD soft Intact pulses distally Incision: dressing C/D/I and no drainage Compartment soft Body mass index is 27.81 kg/m.   Assessment/Plan: Patient stable  xrays n/a Continue mobilization with physical therapy Continue care  Patient doing exceptionally well status post L4 kyphoplasty, and L3/4 discectomy/decompression. Preoperative radicular leg pain is resolved and her quality of life is restored.  Patient has been seen by physical therapy and cleared for discharge to home. She will follow-up with me in 2 weeks for wound check.  Medications and education were provided to the patient prior to discharge. Melina Schools, MD [facility] [phone] '

## 2021-02-15 ENCOUNTER — Telehealth: Payer: Self-pay | Admitting: Oncology

## 2021-02-15 NOTE — Telephone Encounter (Signed)
[name] left a message that she is at home now after having surgery.  She is wondering if [name] wants to schedule an appointment for her to discuss restarting Rubraca.  She has an apt scheduled for 03/08/21 but was wondering if it needs to be sooner.

## 2021-02-16 NOTE — Telephone Encounter (Signed)
Called [name] and advised her of message below from [name].  Rescheduled follow up to 5/12 port/lab at 10:45 and follow up with [name].

## 2021-02-16 NOTE — Telephone Encounter (Signed)
She can resume Rubraca Please move her appt to next week Thursday or Friday with labs; if they use her port in the hospital we do not need flush unless she wants [place] to

## 2021-02-23 ENCOUNTER — Telehealth: Payer: Self-pay | Admitting: Oncology

## 2021-02-23 NOTE — Telephone Encounter (Signed)
[name] called and wanted to make sure her liver function will be checked as part of her labs tomorrow.  Advised her that a CMP will be done which will evaluate liver function.  She said she is taking Tylenol due to her back surgery and that effects her liver function.

## 2021-02-24 ENCOUNTER — Encounter: Payer: Self-pay | Admitting: Hematology and Oncology

## 2021-02-24 ENCOUNTER — Telehealth: Payer: Self-pay | Admitting: Oncology

## 2021-02-24 ENCOUNTER — Inpatient Hospital Stay: Payer: Medicare HMO | Attending: Gynecologic Oncology

## 2021-02-24 ENCOUNTER — Inpatient Hospital Stay: Payer: Medicare HMO | Admitting: Hematology and Oncology

## 2021-02-24 ENCOUNTER — Other Ambulatory Visit: Payer: Self-pay

## 2021-02-24 ENCOUNTER — Other Ambulatory Visit (HOSPITAL_COMMUNITY): Payer: Self-pay

## 2021-02-24 ENCOUNTER — Telehealth: Payer: Self-pay

## 2021-02-24 DIAGNOSIS — G8929 Other chronic pain: Secondary | ICD-10-CM

## 2021-02-24 DIAGNOSIS — Z79899 Other long term (current) drug therapy: Secondary | ICD-10-CM | POA: Insufficient documentation

## 2021-02-24 DIAGNOSIS — M549 Dorsalgia, unspecified: Secondary | ICD-10-CM

## 2021-02-24 DIAGNOSIS — C562 Malignant neoplasm of left ovary: Secondary | ICD-10-CM | POA: Diagnosis not present

## 2021-02-24 DIAGNOSIS — D6481 Anemia due to antineoplastic chemotherapy: Secondary | ICD-10-CM

## 2021-02-24 DIAGNOSIS — T451X5A Adverse effect of antineoplastic and immunosuppressive drugs, initial encounter: Secondary | ICD-10-CM

## 2021-02-24 DIAGNOSIS — I10 Essential (primary) hypertension: Secondary | ICD-10-CM

## 2021-02-24 DIAGNOSIS — Z7984 Long term (current) use of oral hypoglycemic drugs: Secondary | ICD-10-CM | POA: Insufficient documentation

## 2021-02-24 DIAGNOSIS — Z452 Encounter for adjustment and management of vascular access device: Secondary | ICD-10-CM | POA: Diagnosis not present

## 2021-02-24 DIAGNOSIS — Z8543 Personal history of malignant neoplasm of ovary: Secondary | ICD-10-CM | POA: Diagnosis present

## 2021-02-24 LAB — CBC WITH DIFFERENTIAL/PLATELET
Abs Immature Granulocytes: 0.1 10*3/uL — ABNORMAL HIGH (ref 0.00–0.07)
Basophils Absolute: 0.1 10*3/uL (ref 0.0–0.1)
Basophils Relative: 1 %
Eosinophils Absolute: 0.1 10*3/uL (ref 0.0–0.5)
Eosinophils Relative: 1 %
HCT: 31.5 % — ABNORMAL LOW (ref 36.0–46.0)
Hemoglobin: 11 g/dL — ABNORMAL LOW (ref 12.0–15.0)
Immature Granulocytes: 1 %
Lymphocytes Relative: 28 %
Lymphs Abs: 2.2 10*3/uL (ref 0.7–4.0)
MCH: 34.4 pg — ABNORMAL HIGH (ref 26.0–34.0)
MCHC: 34.9 g/dL (ref 30.0–36.0)
MCV: 98.4 fL (ref 80.0–100.0)
Monocytes Absolute: 0.5 10*3/uL (ref 0.1–1.0)
Monocytes Relative: 7 %
Neutro Abs: 4.8 10*3/uL (ref 1.7–7.7)
Neutrophils Relative %: 62 %
Platelets: 256 10*3/uL (ref 150–400)
RBC: 3.2 MIL/uL — ABNORMAL LOW (ref 3.87–5.11)
RDW: 15.5 % (ref 11.5–15.5)
WBC: 7.7 10*3/uL (ref 4.0–10.5)
nRBC: 0 % (ref 0.0–0.2)

## 2021-02-24 LAB — COMPREHENSIVE METABOLIC PANEL
ALT: 22 U/L (ref 0–44)
AST: 18 U/L (ref 15–41)
Albumin: 3.5 g/dL (ref 3.5–5.0)
Alkaline Phosphatase: 97 U/L (ref 38–126)
Anion gap: 10 (ref 5–15)
BUN: 20 mg/dL (ref 8–23)
CO2: 26 mmol/L (ref 22–32)
Calcium: 9.4 mg/dL (ref 8.9–10.3)
Chloride: 104 mmol/L (ref 98–111)
Creatinine, Ser: 0.83 mg/dL (ref 0.44–1.00)
GFR, Estimated: >60 mL/min (ref >60)
Glucose, Bld: 97 mg/dL (ref 70–99)
Potassium: 3.7 mmol/L (ref 3.5–5.1)
Sodium: 140 mmol/L (ref 135–145)
Total Bilirubin: 0.5 mg/dL (ref 0.3–1.2)
Total Protein: 6.2 g/dL — ABNORMAL LOW (ref 6.5–8.1)

## 2021-02-24 NOTE — Assessment & Plan Note (Signed)
She had history of chronic back pain and now continues to have mild pain after surgery but overall improved Her mobility is better She has resumed gabapentin without problems She has appointment to follow with orthopedic/neurosurgeon in the near future with plan for physical therapy

## 2021-02-24 NOTE — Assessment & Plan Note (Signed)
The cause of her anemia is multifactorial, combination of anemia chronic illness and recent surgery She is not bleeding and not symptomatic Observe only for now

## 2021-02-24 NOTE — Telephone Encounter (Signed)
-----   Message from Heath Lark, MD sent at 02/24/2021 12:42 PM EDT ----- Pls call and let her know CMP is normal, no need to worry about her liver

## 2021-02-24 NOTE — Telephone Encounter (Signed)
Patient was notified by nurse navigator regarding lab results.

## 2021-02-24 NOTE — Telephone Encounter (Signed)
[name] called and asked about her CMP results.  Advised her everything was in normal limits except her protein which was a little low at 6.2.  She verbalized understanding and agreement.

## 2021-02-24 NOTE — Progress Notes (Signed)
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[facility]) She is recovering well from her surgery The mild anemia is likely due to recent surgery She is not symptomatic We will continue treatment as scheduled I will see her again in 8 weeks for further follow-up I reassured the patient that it is not unreasonable to hold Rubraca for recent surgery  Anemia due to antineoplastic chemotherapy The cause of her anemia is multifactorial, combination of anemia chronic illness and recent surgery She is not bleeding and not symptomatic Observe only for now  Chronic back pain greater than 3 months duration She had history of chronic back pain and now continues to have mild pain after surgery but overall improved Her mobility is better She has resumed gabapentin without problems She has appointment to follow with orthopedic/neurosurgeon in the near future with plan for physical therapy  Essential hypertension Her blood pressure is mildly elevated today, could be exacerbated by slight anxiety and whitecoat hypertension She will continue her prescribed blood pressure medications for now   No orders of the defined types were placed in this encounter.   All questions were answered. The patient knows to call the clinic with any problems, questions or concerns. The total time spent in the appointment was 20 minutes encounter with patients including review of chart and various tests results, discussions about plan of care and coordination of care plan   Heath Lark, MD 02/24/2021 11:28 AM  INTERVAL HISTORY: Please see below for problem oriented charting. She returns with her family for further follow-up She is recovering well from recent surgery She still have some mild pain but not severe The patient denies any recent signs or symptoms of bleeding such as spontaneous  epistaxis, hematuria or hematochezia. 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 - TUMOR INVOLVES OVARY SURFACE AND LEFT FALLOPIAN TUBE. - SEE ONCOLOGY TABLE. 2. Mesentery, small bowel mesentery biopsy #1 - BENIGN FIBROADIPOSE TISSUE. 3. Ovary and fallopian tube, right - BENIGN OVARY WITH INCLUSION CYSTS. - BENIGN FALLOPIAN TUBE WITH PARATUBAL CYSTS AND ADENOFIBROMA. 4. Omentum, resection for tumor - BENIGN ADIPOSE TISSUE. 5. Peritoneum, biopsy, left diaphragmatic - BENIGN PERITONEAL TYPE TISSUE. 6. Peritoneum, biopsy, right diaphragmatic - BENIGN PERITONEAL TYPE TISSUE. 7. Mesentery, small bowel mesenteric biopsy #2 - BENIGN FIBROADIPOSE TISSUE. 8. Lymph nodes, regional resection, right pelvic - FIVE OF FIVE LYMPH NODES NEGATIVE FOR CARCINOMA (0/5). 9. Lymph nodes, regional resection, left pelvic - FOUR OF FOUR LYMPH NODES NEGATIVE FOR CARCINOMA (0/4). 10. Peritoneum, biopsy, left gutter - BENIGN PERITONEAL TYPE TISSUE. 11. Peritoneum, biopsy, bladder - BENIGN PERITONEAL TYPE TISSUE WITH ACUTE INFLAMMATION AND CALCIFICATIONS.  12. Peritoneum, biopsy, cul-de-sac - BENIGN PERITONEAL TYPE TISSUE WITH ACUTE INFLAMMATION AND CALCIFICATIONS. 13. Soft tissue, biopsy, right gutter - BENIGN FIBROADIPOSE TISSUE. 14. Lymph node, biopsy, right para-aortic - TWO OF TWO LYMPH NODES NEGATIVE FOR CARCINOMA (0/2). 15. Lymph node, biopsy, left para-aortic -  ONE OF ONE LYMPH NODES NEGATIVE FOR CARCINOMA (0/1). Microscopic  Comment 1. OVARY Specimen(s): Left ovary and fallopian tube. Procedure: (including lymph node sampling): Bilateral salpingo-oophorectomy with omental and peritoneal biopsies and lymph node biopsies. Primary tumor site (including laterality): Left ovary. Ovarian surface involvement: Present. Ovarian capsule intact without fragmentation: Intact. Maximum tumor size (cm): 11 cm total. Histologic type: Serous cystadenocarcinoma. Grade: High grade (low grade areas also present). Peritoneal implants: (specify invasive or non-invasive): N/A. Pelvic extension (list additional structures on separate lines and if involved): Left fallopian tube. Lymph nodes: number examined 12 ; number positive 0 TNM code: pT2a, pN0, pMX FIGO Stage (based on pathologic findings, needs clinical correlation): IIA Comments: None.   01/31/2018 Pathology Results   PERITONEAL WASHING (SPECIMEN 1 OF 1 COLLECTED 01/31/18): MALIGNANT CELLS PRESENT CONSISTENT WITH METASTATIC CARCINOMA.   01/31/2018 Surgery   Procedure(s) Performed:  1. Robotic BSO and washings. 2. Exploratory laparotomy, Staging including infragastric omentectomy, Bilateral pelvic and paraaortic lymphadenectomy, pertioneal biopsies.  Specimens: Bilateral tubes / ovaries, bilateral pelvic and paraaortic lymph nodes, peritoneal biopsies, washings and omentum.  Operative Findings: The left adnexa was adherent to the left pelvic peritoneum suspected due to the inferior retraction from the vaginal hysterectomy. Intraoperative leakage of cystic fluid from left ovary.  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  The right adnexa grossly appeared normal although slightly enlarged for her age.  There were some indurated lymph nodes but no overtly malignant lymph nodes were suspected.  Small bowel mesentery had 3-4 superficial possible implants.  1 of these was sent for frozen section returned benign.  She did have adhesive  disease from her open cholecystectomy in the right upper quadrant.  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 16 mm exophytic lesion upper pole right kidney has attenuation too high to be a simple cyst. This may be a cyst complicated by proteinaceous debris or hemorrhage, but renal cell carcinoma is a concern. Routine outpatient follow-up MRI of the abdomen without and with contrast recommended after resolution of patient's acute symptoms (so she is better able to participate with positioning and breath holding). 2. Mild edema/possible minimal hemorrhage in the extraperitoneal soft tissues of the pelvic for tracking up both pelvic sidewalls. Imaging appearance is not outside of the spectrum of findings expected 6 days after the reported surgery. Trace interloop mesenteric and free fluid is identified in the peritoneal cavity in there is some mild edema in the omentum. There is no organized or rim enhancing collection to suggest evolving abscess. 3. Gas in the extraperitoneal soft tissues of the left lower quadrant is associated with gas in the subcutaneous fat of the lower left anterior abdominal wall. This is not unexpected on postoperative day 6. No evidence for intraperitoneal free air. 4. No CT features to suggest small bowel obstruction. Oral contrast material has migrated about halfway through the small bowel loops but there is no differential distention of proximal versus distal small bowel. The colon is diffusely distended and fluid-filled proximally, but formed stool is noted in the distal colon. Given  apparent slow migration of contrast and fluid-filled small and large bowel loops, a component of ileus is possible.   02/14/2018 Imaging   MR abdomen Benign Bosniak category 2 cyst in upper pole of right kidney, corresponding with lesion seen on previous CT. No evidence of renal neoplasm or other significant abnormality.    02/18/2018 Cancer Staging   Staging form: Ovary, Fallopian Tube, and Primary Peritoneal Carcinoma, AJCC 8th Edition - Pathologic: Stage II (pT2, pN0, cM0) - Signed by Heath Lark, MD on 02/18/2018   02/28/2018 Procedure   Status post right IJ port catheter placement. 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 For last cycle, taxol was omitted   07/18/2018 Imaging   1. No evidence for residual or recurrent tumor within the abdomen or pelvis. There has been interval resolution edema and fluid within the peritoneal cavity. 2. New pulmonary nodule is identified within the right lower lobe measuring 1.1 cm, image 87/4. Consider one of the following in 3 months for both low-risk and high-risk individuals: (a) repeat chest CT, (b) follow-up PET-CT, or (c) tissue sampling. This recommendation follows the consensus statement: Guidelines for Management of Incidental Pulmonary Nodules Detected on CT Images: From the Fleischner Society 2017; Radiology 2017; 284:228-243. 3. Stable 7 mm nodule within the right upper lobe. 4. Aortic Atherosclerosis (ICD10-I70.0). 5. Multi vessel coronary artery atherosclerotic calcifications.   07/18/2018 Tumor Marker   Patient's tumor was tested for the following markers: CA-125 Results of the tumor marker test revealed 14.9   07/25/2018 PET scan   1. Regressing right lower lobe peripheral lung lesion, likely  resolving inflammatory or atelectatic process. No hypermetabolism to suggest malignancy. I would recommend a follow-up noncontrast chest CT and 4-6 months. Small upper lobe pulmonary nodules are stable. 2. 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   07/31/2018 -  Chemotherapy   The patient started taking rucaparib   10/25/2018 Tumor Marker   Patient's tumor was tested for the following markers: CA-125 Results of the tumor marker test revealed 12.1   11/25/2018 Imaging   1. Right lower lobe nodule has resolved. Additional right lung nodules are stable. 2. Stable hazy nodularity along the left external iliac chain.  3. Hepatomegaly. 4. Aortic atherosclerosis (ICD10-170.0). Coronary artery calcification. 5. Enlarged pulmonic trunk, indicative of pulmonary arterial hypertension.   01/16/2019 Tumor Marker   Patient's tumor was tested for the following markers: CA-125 Results of the tumor marker test revealed 13.1   05/22/2019 Tumor Marker   Patient's tumor was tested for the following markers: CA-125 Results of the tumor marker test revealed 14.4   06/24/2019 Imaging   Ct chest, abdomen and pelvis 1. There are multiple clustered tiny centrilobular nodules and irregular opacities of the bilateral lower lobes and lingula, increased compared to prior examination, with underlying bandlike scarring of the medial right middle lobe and right lung base (e.g. series 4, image 100, 110, 123). 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  2. Stable 6 mm pulmonary nodule of the right upper lobe (series 4, image 45). Attention on follow-up.   3. Status post hysterectomy and oophorectomy. No evidence of recurrent or metastatic disease in the abdomen  or pelvis.   4. There are occasional sigmoid diverticula and focal fat stranding about a diverticulum of the mid  sigmoid (series 2, image 103). Correlate for signs and symptoms of acute diverticulitis.   5. Other chronic, incidental, and postoperative findings as detailed above.   06/24/2019 Tumor Marker   Patient's tumor was tested for the following markers: CA-125 Results of the tumor marker test revealed 14.4.   08/19/2019 Tumor Marker   Patient's tumor was tested for the following markers: CA-125 Results of the tumor marker test revealed 10.3   09/25/2019 Imaging   CT chest 1. Interval resolution of previously demonstrated clustered centrilobular nodularity at both lung bases consistent with resolved inflammation. Stable 6 mm right upper lobe nodule consistent with a benign finding. No new or enlarging pulmonary nodules.  2. Stable chronic bronchiectasis and scarring in the right middle lobe and lingula. 3. No evidence of metastatic disease. 4. Aortic Atherosclerosis (ICD10-I70.0).   10/30/2019 Tumor Marker   Patient's tumor was tested for the following markers: CA-125 Results of the tumor marker test revealed 12.1   12/25/2019 Tumor Marker   Patient's tumor was tested for the following markers: CA-125. Results of the tumor marker test revealed 14.8.   02/19/2020 Tumor Marker   Patient's tumor was tested for the following markers: CA-125. Results of the tumor marker test revealed 15.5   04/22/2020 Tumor Marker   Patient's tumor was tested for the following markers: CA-125 Results of the tumor marker test revealed 8.9   06/16/2020 Tumor Marker   Patient's tumor was tested for the following markers: CA-125 Results of the tumor marker test revealed 12.5   06/23/2020 Imaging   1. Stable exam. No new or progressive findings to suggest recurrent or metastatic disease. 2. Aortic Atherosclerosis (ICD10-I70.0).   08/19/2020 Tumor Marker   Patient's tumor was tested for the following markers: CA-125 Results of the tumor marker test revealed 19.1   09/21/2020 Tumor Marker   Patient's tumor was tested  for the following markers: CA-125 Results of the tumor marker test revealed 12.7.   11/17/2020 Tumor Marker   Patient's tumor was tested for the following markers: CA-125 Results of the tumor marker test revealed 14.7   01/11/2021 Tumor Marker   Patient's tumor was tested for the following markers: CA-125 Results of the tumor marker test revealed 11.6     REVIEW OF SYSTEMS:   Constitutional: Denies fevers, chills or abnormal weight loss Eyes: Denies blurriness of vision Ears, nose, mouth, throat, and face: Denies mucositis or sore throat Respiratory: Denies cough, dyspnea or wheezes Cardiovascular: Denies palpitation, chest discomfort or lower extremity swelling Gastrointestinal:  Denies nausea, heartburn or change in bowel habits Skin: Denies abnormal skin rashes Lymphatics: Denies new lymphadenopathy or easy bruising Neurological:Denies numbness, tingling or new weaknesses Behavioral/Psych: Mood is stable, no new changes  All other systems were reviewed with the patient and are negative.  I have reviewed the past medical history, past surgical history, social history and family history with the patient and they are unchanged from previous note.  ALLERGIES:  is allergic to penicillins and rosuvastatin.  MEDICATIONS:  Current Outpatient Medications  Medication Sig Dispense Refill  . acetaminophen (TYLENOL) 500 MG tablet Take 1 tablet (500 mg total) by mouth every 6 (six) hours as needed for moderate pain. 100 tablet 2  . ALPRAZolam (XANAX) 0.5 MG tablet Take 0.5 mg by mouth at bedtime as needed for sleep.    Marland Kitchen amLODipine (NORVASC) 5 MG tablet Take 5 mg  by mouth daily.    Marland Kitchen atorvastatin (LIPITOR) 20 MG tablet Take 10 mg by mouth daily.    . Cyanocobalamin 1000 MCG/ML KIT Inject 1,000 mcg as directed every 14 (fourteen) days.    Marland Kitchen docusate sodium (COLACE) 100 MG capsule Take 100 mg by mouth 2 (two) times daily.    Marland Kitchen FLUoxetine (PROZAC) 20 MG capsule Take 20 mg by mouth daily after  breakfast.    . glimepiride (AMARYL) 1 MG tablet Take 1 mg by mouth daily as needed. If CBG is elevated    . hydrochlorothiazide (HYDRODIURIL) 25 MG tablet Take 25 mg by mouth daily.    Marland Kitchen levothyroxine (SYNTHROID) 112 MCG tablet Take 1 tablet (112 mcg total) by mouth daily before breakfast.    . linaclotide (LINZESS) 145 MCG CAPS capsule Take 145 mcg by mouth daily before breakfast.    . losartan (COZAAR) 100 MG tablet Take 100 mg by mouth at bedtime.     . Melatonin 5 MG TABS Take 5-10 mg by mouth at bedtime as needed (sleep).     . metFORMIN (GLUCOPHAGE-XR) 500 MG 24 hr tablet Take 1,000 mg by mouth 2 (two) times daily.     . ondansetron (ZOFRAN) 4 MG tablet Take 1 tablet (4 mg total) by mouth every 8 (eight) hours as needed for nausea or vomiting. 20 tablet 0   No current facility-administered medications for this visit.    PHYSICAL EXAMINATION: ECOG PERFORMANCE STATUS: 1 - Symptomatic but completely ambulatory  Vitals:   02/24/21 1120  BP: (!) 159/72  Pulse: 87  Resp: 18  Temp: (!) 97.5 F (36.4 C)  SpO2: 100%   Filed Weights   02/24/21 1120  Weight: 159 lb 3.2 oz (72.2 kg)    GENERAL:alert, no distress and comfortable NEURO: alert & oriented x 3 with fluent speech, no focal motor/sensory deficits  LABORATORY DATA:  I have reviewed the data as listed    Component Value Date/Time   NA 136 02/10/2021 0849   K 3.7 02/10/2021 0849   CL 103 02/10/2021 0849   CO2 22 02/10/2021 0849   GLUCOSE 123 (H) 02/10/2021 0849   BUN 19 02/10/2021 0849   CREATININE 1.16 (H) 02/10/2021 0849   CREATININE 0.93 02/13/2019 1024   CALCIUM 8.8 (L) 02/10/2021 0849   PROT 6.1 (L) 02/08/2021 0447   ALBUMIN 3.3 (L) 02/08/2021 0447   AST 27 02/08/2021 0447   AST 26 02/13/2019 1024   ALT 36 02/08/2021 0447   ALT 23 02/13/2019 1024   ALKPHOS 53 02/08/2021 0447   BILITOT 1.2 02/08/2021 0447   BILITOT 0.8 02/13/2019 1024   GFRNONAA 48 (L) 02/10/2021 0849   GFRNONAA 59 (L) 02/13/2019 1024    GFRAA >60 06/16/2020 0918   GFRAA >60 02/13/2019 1024    No results found for: SPEP, UPEP  Lab Results  Component Value Date   WBC 7.7 02/24/2021   NEUTROABS 4.8 02/24/2021   HGB 11.0 (L) 02/24/2021   HCT 31.5 (L) 02/24/2021   MCV 98.4 02/24/2021   PLT 256 02/24/2021      Chemistry      Component Value Date/Time   NA 136 02/10/2021 0849   K 3.7 02/10/2021 0849   CL 103 02/10/2021 0849   CO2 22 02/10/2021 0849   BUN 19 02/10/2021 0849   CREATININE 1.16 (H) 02/10/2021 0849   CREATININE 0.93 02/13/2019 1024      Component Value Date/Time   CALCIUM 8.8 (L) 02/10/2021 0849   ALKPHOS 53  02/08/2021 0447   AST 27 02/08/2021 0447   AST 26 02/13/2019 1024   ALT 36 02/08/2021 0447   ALT 23 02/13/2019 1024   BILITOT 1.2 02/08/2021 0447   BILITOT 0.8 02/13/2019 1024       RADIOGRAPHIC STUDIES: I have personally reviewed the radiological images as listed and agreed with the findings in the report. DG Lumbar Spine 2-3 Views  Result Date: 02/09/2021 CLINICAL DATA:  Elective surgery. EXAM: LUMBAR SPINE - 2-3 VIEW; DG C-ARM 1-60 MIN COMPARISON:  Lumbar MRI 02/07/2021 FINDINGS: Five fluoroscopic spot views obtained in the operating room in frontal and lateral projections. There is transitional lumbosacral anatomy. Interval kyphoplasty of lower thoracic vertebra, L4 if the transitional lumbosacral vertebra is considered S1. fluoroscopy time 2 minutes 10 seconds. Dose 50.87 mGy. IMPRESSION: Intraoperative fluoroscopy during kyphoplasty of lumbar vertebra. Electronically Signed   By: Keith Rake M.D.   On: 02/09/2021 21:18   MR LUMBAR SPINE WO CONTRAST  Result Date: 02/07/2021 CLINICAL DATA:  Right lower back, buttock, and hip pain radiating to the knee. EXAM: MRI LUMBAR SPINE WITHOUT CONTRAST TECHNIQUE: Multiplanar, multisequence MR imaging of the lumbar spine was performed. No intravenous contrast was administered. COMPARISON:  Noncontrast lumbar spine CT 09/01/2020 FINDINGS:  Segmentation: Transitional lumbosacral anatomy with a Lumbarized S1. Alignment: Mild lumbar levoscoliosis. Unchanged trace retrolisthesis of L1 on L2. Vertebrae: L4 superior endplate compression fracture with mild edema and at most 10% vertebral body height loss. T12 superior endplate Schmorl's node. T12 and L5 superior endplate Schmorl's nodes no suspicious marrow lesion. Conus medullaris and cauda equina: Conus extends to the L2 level. Conus and cauda equina appear normal. Paraspinal and other soft tissues: Unremarkable. Disc levels: L1-2: Disc desiccation and mild disc space narrowing. Disc bulging without stenosis. L2-3: Negative. L3-4: Mild disc space narrowing. There is a new large right paracentral disc extrusion with superior migration to the L3 pedicle level resulting in severe right lateral recess stenosis and right L3 nerve root impingement. Disc bulging and mild facet and ligamentum flavum hypertrophy result in mild bilateral neural foraminal stenosis. No significant generalized spinal stenosis. L4-5: Disc desiccation and moderate disc space narrowing. Disc bulging and mild-to-moderate facet and ligamentum flavum hypertrophy result in mild bilateral lateral recess stenosis and mild-to-moderate right neural foraminal stenosis, similar to the prior CT. No significant generalized spinal stenosis. L5-S1: Disc desiccation and severe left-sided disc space narrowing. Left eccentric disc bulging and mild facet hypertrophy result in mild to moderate bilateral lateral recess stenosis and mild right and moderate to severe left neural foraminal stenosis, similar to the prior CT. No significant generalized spinal stenosis. S1-2: Transitional anatomy. Unchanged mild left neural foraminal stenosis due to spurring. No spinal stenosis. IMPRESSION: 1. New large right paracentral disc extrusion at L3-4 with right L3 nerve root impingement in the lateral recess. 2. Acute or subacute L4 compression fracture with 10% height  loss. 3. 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 Electronically Signed   By: Logan Bores M.D.   On: 02/07/2021 16:19   MR HIP RIGHT WO CONTRAST  Result Date: 02/07/2021 CLINICAL DATA:  Hip pain, stress fracture suspected, neg xray. Discomfort in her right lower back buttock hip EXAM: MR OF THE RIGHT HIP WITHOUT CONTRAST TECHNIQUE: Multiplanar, multisequence MR imaging was performed. No intravenous contrast was administered. COMPARISON:  CT abdomen pelvis 06/23/2020 FINDINGS: Bones: No evidence of fracture. Focal reactive bony edema and cystic change in the right greater trochanter related to gluteal tendon pathology. Articular cartilage and  labrum Articular cartilage:  Mild chondrosis. Labrum:  Degenerative superior labral tearing. Joint or bursal effusion Joint effusion:  Small effusion, symmetric with the left hip. Bursae: Sub gluteus minimus Muscles and tendons Muscles and tendons: There are full-thickness gluteus medius and minimus tears with mild fluid in the subgluteus bursa at the greater trochanter. There is chronic gluteus medius and gluteus minimus muscle atrophy as seen on prior CT September 2021, with intramuscular edema in the residual muscle belly. There is intramuscular edema within the right pectineus muscle and abductor brevis muscle Mild tendinosis of the proximal hamstring tendons. Other findings Miscellaneous:   Prior hysterectomy. IMPRESSION: Full-thickness gluteus minimus and medius tendon tears with associated mild trochanteric bursitis. Atrophy of the gluteus minimus and medius muscles compatible with chronic tear. Intramuscular edema within the right hip adductor muscles and residual gluteus medius/minimus muscles compatible grade 1 muscle strain. 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 Electronically Signed   By: Maurine Simmering   On: 02/07/2021 16:33   DG C-Arm 1-60 Min  Result Date: 02/09/2021 CLINICAL DATA:  Elective surgery.  EXAM: LUMBAR SPINE - 2-3 VIEW; DG C-ARM 1-60 MIN COMPARISON:  Lumbar MRI 02/07/2021 FINDINGS: Five fluoroscopic spot views obtained in the operating room in frontal and lateral projections. There is transitional lumbosacral anatomy. Interval kyphoplasty of lower thoracic vertebra, L4 if the transitional lumbosacral vertebra is considered S1. fluoroscopy time 2 minutes 10 seconds. Dose 50.87 mGy. IMPRESSION: Intraoperative fluoroscopy during kyphoplasty of lumbar vertebra. Electronically Signed   By: Keith Rake M.D.   On: 02/09/2021 21:18

## 2021-02-24 NOTE — Assessment & Plan Note (Signed)
Her blood pressure is mildly elevated today, could be exacerbated by slight anxiety and whitecoat hypertension She will continue her prescribed blood pressure medications for now

## 2021-02-24 NOTE — Assessment & Plan Note (Signed)
She is recovering well from her surgery The mild anemia is likely due to recent surgery She is not symptomatic We will continue treatment as scheduled I will see her again in 8 weeks for further follow-up I reassured the patient that it is not unreasonable to hold [place] for recent surgery

## 2021-02-25 ENCOUNTER — Telehealth: Payer: Self-pay

## 2021-02-25 LAB — CA 125: Cancer Antigen (CA) 125: 11.8 U/mL (ref 0.0–38.1)

## 2021-02-25 NOTE — Telephone Encounter (Signed)
-----   Message from Burna Mortimer, [place] sent at 02/25/2021 10:38 AM EDT -----  ----- Message ----- From: Heath Lark, MD Sent: 02/25/2021   9:25 AM EDT To: Burna Mortimer, CMA  Pls call her and let her know CA-125 is normal

## 2021-02-25 NOTE — Telephone Encounter (Signed)
I spoke with pt and advised as indicated. Pt expressed understanding of this information. 

## 2021-02-28 ENCOUNTER — Other Ambulatory Visit (HOSPITAL_COMMUNITY): Payer: Self-pay

## 2021-03-07 ENCOUNTER — Other Ambulatory Visit (HOSPITAL_COMMUNITY): Payer: Self-pay

## 2021-03-07 ENCOUNTER — Other Ambulatory Visit: Payer: Self-pay | Admitting: Hematology and Oncology

## 2021-03-07 DIAGNOSIS — C562 Malignant neoplasm of left ovary: Secondary | ICD-10-CM

## 2021-03-07 MED ORDER — RUBRACA 300 MG PO TABS
ORAL_TABLET | ORAL | 11 refills | Status: DC
  Filled 2021-03-07: qty 60, 30d supply, fill #0
  Filled 2021-04-04: qty 60, 30d supply, fill #1
  Filled 2021-05-02: qty 60, 30d supply, fill #2
  Filled 2021-06-07: qty 60, 30d supply, fill #3
  Filled 2021-07-07: qty 60, 30d supply, fill #4
  Filled 2021-08-08: qty 60, 30d supply, fill #5
  Filled 2021-09-05: qty 60, 30d supply, fill #6
  Filled 2021-10-05: qty 60, 30d supply, fill #7
  Filled 2021-11-03: qty 60, 30d supply, fill #8
  Filled 2021-11-28: qty 60, 30d supply, fill #9
  Filled 2022-01-03: qty 60, 30d supply, fill #10
  Filled 2022-01-31: qty 60, 30d supply, fill #11

## 2021-03-08 ENCOUNTER — Ambulatory Visit: Payer: Medicare HMO | Admitting: Hematology and Oncology

## 2021-03-08 ENCOUNTER — Other Ambulatory Visit (HOSPITAL_COMMUNITY): Payer: Self-pay

## 2021-03-08 ENCOUNTER — Other Ambulatory Visit: Payer: Medicare HMO

## 2021-03-08 ENCOUNTER — Encounter: Payer: Self-pay | Admitting: Hematology and Oncology

## 2021-03-09 ENCOUNTER — Encounter: Payer: Self-pay | Admitting: Allergy and Immunology

## 2021-03-09 ENCOUNTER — Ambulatory Visit: Payer: Medicare HMO | Admitting: Allergy and Immunology

## 2021-03-09 ENCOUNTER — Other Ambulatory Visit: Payer: Self-pay

## 2021-03-09 VITALS — BP 124/82 | HR 77 | Resp 16 | Ht 64.0 in | Wt 158.2 lb

## 2021-03-09 DIAGNOSIS — L503 Dermatographic urticaria: Secondary | ICD-10-CM | POA: Diagnosis not present

## 2021-03-09 DIAGNOSIS — L299 Pruritus, unspecified: Secondary | ICD-10-CM

## 2021-03-09 NOTE — Progress Notes (Signed)
[place] - [place] - [place] - [place] - [place]   Dear Dr. Bea Graff,  Thank you for referring [name] to the [facility] on 03/09/2021.   Below is a summation of this patient's evaluation and recommendations.  Thank you for your referral. I will keep you informed about this patient's response to treatment.   If you have any questions please do not hesitate to contact me.   Sincerely,  Jiles Prows, MD Allergy / [facility]   ______________________________________________________________________    NEW PATIENT NOTE  Referring Provider: Raina Mina., MD Primary Provider: Raina Mina., MD Date of office visit: 03/09/2021    Subjective:   Chief Complaint:  [name] (DOB: 1941/07/29) is a 80 y.o. female who presents to the clinic on 03/09/2021 with a chief complaint of Pruritus .     HPI: [name] presents to this clinic in evaluation of itchiness.  She states for the past 3 months she has been intensely itchy and this appears to occur on a random basis and an intermittent basis and can involve any location in her body.  When it does occur it can last for several days.  She can go several weeks without having any itchiness.  She will take a Zyrtec and a Benadryl which does not really help much.  Recently she required the administration of systemic steroids including both prednisone and Kenalog to get this under control.  She is taking her last prednisone today from her recent course of prednisone and she has not had any itchiness over the course of the past 4 days.  She does not really note an obvious provoking factor giving rise to this issue although during this April 2022 she did require back surgery and had to take some narcotics which definitely made her a lot more itchy.  She has no associated systemic or constitutional symptoms.  She has a history of  ovarian cancer diagnosed and treated in 2019 and has been followed by Dr. Lottie Rater who has documented nonrecurrence of her disease.  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  She has also been recently diagnosed with iatrogenic hyperthyroidism and apparently had her thyroid supplement decreased.  Past Medical History:  Diagnosis Date  . Arthritis   . Diabetes mellitus without complication ([place])    type 2   . GERD (gastroesophageal reflux disease)   . Hypertension   . Hypothyroidism   . Ovarian cancer ([place]) 01/31/2018  . Pneumonia    its been 4 years   . PONV (postoperative nausea and vomiting)   . Urticaria   . Vitamin B 12 deficiency     Past Surgical History:  Procedure Laterality Date  . BLADDER SUSPENSION  2002  . BREAST SURGERY     several benign cysts removed; surgeries from [place]   . CHOLECYSTECTOMY  1988  . DECOMPRESSIVE LUMBAR LAMINECTOMY LEVEL 1 N/A 02/09/2021   Procedure: DECOMPRESSIVE LUMBAR LAMINECTOMY LEVEL 1 lumbar three-four;  Surgeon: Melina Schools, MD;  Location: [facility];  Service: Orthopedics;  Laterality: N/A;  . EYE SURGERY  2010 amd 2012   cataracts removed bilateral   . IR FLUORO GUIDE PORT INSERTION RIGHT  02/28/2018  . IR US GUIDE VASC ACCESS RIGHT  02/28/2018  . KNEE ARTHROSCOPY Left    l knee x2  . KYPHOPLASTY  N/A 02/09/2021   Procedure: KYPHOPLASTY lumbar four;  Surgeon: Melina Schools, MD;  Location: [facility];  Service: Orthopedics;  Laterality: N/A;  . PARATHYROIDECTOMY  2010  . ROBOTIC ASSISTED SALPINGO OOPHERECTOMY Bilateral 01/31/2018   Procedure: XI ROBOTIC ASSISTED BILATERAL SALPINGO OOPHORECTOMY, STAGING, LAPAROTOMY, PELVIC AND PARA AORTIC LYMPH NODE DISSECTION, OMENTECTOMY;  Surgeon: Isabel Caprice, MD;  Location: [facility];  Service: Gynecology;  Laterality: Bilateral;  . ROTATOR CUFF REPAIR  2005   right   . TOTAL KNEE ARTHROPLASTY Left 12/08/2014   Procedure: LEFT TOTAL  KNEE ARTHROPLASTY;  Surgeon: Tobi Bastos, MD;  Location: [facility];  Service: Orthopedics;  Laterality: Left;  Marland Kitchen VAGINAL HYSTERECTOMY  1984    Allergies as of 03/09/2021      Reactions   Penicillins Anaphylaxis   Has patient had a PCN reaction causing immediate rash, facial/tongue/throat swelling, SOB or lightheadedness with hypotension: Yes Has patient had a PCN reaction causing severe rash involving mucus membranes or skin necrosis: No Has patient had a PCN reaction that required hospitalization: Yes Has patient had a PCN reaction occurring within the last 10 years: No If all of the above answers are "NO", then may proceed with Cephalosporin use.   Rosuvastatin Itching   Possible. Possible.      Medication List      acetaminophen 500 MG tablet Commonly known as: TYLENOL Take 1 tablet (500 mg total) by mouth every 6 (six) hours as needed for moderate pain.   ALPRAZolam 0.5 MG tablet Commonly known as: XANAX Take 0.5 mg by mouth at bedtime as needed for sleep.   amLODipine 5 MG tablet Commonly known as: NORVASC Take 5 mg by mouth daily.   Cyanocobalamin 1000 MCG/ML Kit Inject 1,000 mcg as directed every 14 (fourteen) days.   famotidine 40 MG tablet Commonly known as: PEPCID Take by mouth.   FLUoxetine 20 MG capsule Commonly known as: PROZAC Take 20 mg by mouth daily after breakfast.   glimepiride 1 MG tablet Commonly known as: AMARYL Take 1 mg by mouth daily as needed. If CBG is elevated   hydrochlorothiazide 25 MG tablet Commonly known as: HYDRODIURIL Take 25 mg by mouth daily.   levothyroxine 100 MCG tablet Commonly known as: SYNTHROID Take 1 tablet by mouth daily.   linaclotide 145 MCG Caps capsule Commonly known as: LINZESS Take 145 mcg by mouth daily before breakfast.   losartan 100 MG tablet Commonly known as: COZAAR Take 100 mg by mouth at bedtime.   melatonin 5 MG Tabs Take 5-10 mg by mouth at bedtime as needed (sleep).   metFORMIN 500 MG 24  hr tablet Commonly known as: GLUCOPHAGE-XR Take 1,000 mg by mouth 2 (two) times daily.   Rubraca 300 MG tablet Generic drug: rucaparib camsylate TAKE 1 TABLET (300 MG TOTAL) BY MOUTH 2 (TWO) TIMES DAILY.   traMADol 50 MG tablet Commonly known as: ULTRAM tramadol 50 mg tablet  Take 1 tablet twice a day by oral route as needed for 10 days.       Review of systems negative except as noted in HPI / PMHx or noted below:  Review of Systems  Constitutional: Negative.   HENT: Negative.   Eyes: Negative.   Respiratory: Negative.   Cardiovascular: Negative.   Gastrointestinal: Negative.   Genitourinary: Negative.   Musculoskeletal: Negative.   Skin: Negative.   Neurological: Negative.   Endo/Heme/Allergies: Negative.   Psychiatric/Behavioral: Negative.     Family History  Problem Relation Age of Onset  .  Lymphoma Mother 23  . Bladder Cancer Sister 73  . Prostate cancer Brother 106       has surgery right away after dx  . Leukemia Brother        76's  . Breast cancer Other 71       niece, GT 2017 reportedly neg  . Prostate cancer Father 39       found on autopsy at death (79)  . Lung cancer Maternal Aunt   . Esophageal cancer Maternal Uncle   . Other Maternal Grandfather 75       cerebral hemmhorage  . Lung cancer Cousin   . Lung cancer Cousin   . Leukemia Cousin   . Allergic rhinitis Neg Hx   . Angioedema Neg Hx   . Asthma Neg Hx   . Atopy Neg Hx   . Eczema Neg Hx   . Immunodeficiency Neg Hx   . Urticaria Neg Hx     Social History   Socioeconomic History  . Marital status: Widowed    Spouse name: Not on file  . Number of children: 2  . Years of education: Not on file  . Highest education level: Not on file  Occupational History  . Occupation: retired Glass blower/designer  Tobacco Use  . Smoking status: Never Smoker  . Smokeless tobacco: Never Used  Vaping Use  . Vaping Use: Never used  Substance and Sexual Activity  . Alcohol use: No  . Drug use: No  .  Sexual activity: Not on file  Other Topics Concern  . Not on file  Social History Narrative  . Not on file   Environmental and Social history  Lives in a house with a dry environment, no animals located inside the household, no carpet in the bedroom, plastic on the bed, plastic on the pillow, no smoking ongoing with inside the household.  Objective:   Vitals:   03/09/21 1436  BP: 124/82  Pulse: 77  Resp: 16  SpO2: 98%   Height: _0  (162.6 cm) Weight: 158 lb 3.2 oz (71.8 kg)  Physical Exam Constitutional:      Appearance: She is not diaphoretic.  HENT:     Head: Normocephalic.     Right Ear: Tympanic membrane, ear canal and external ear normal.     Left Ear: Tympanic membrane, ear canal and external ear normal.     Nose: Nose normal. No mucosal edema or rhinorrhea.     Mouth/Throat:     Pharynx: Uvula midline. No oropharyngeal exudate.  Eyes:     Conjunctiva/sclera: Conjunctivae normal.  Neck:     Thyroid: No thyromegaly.     Trachea: Trachea normal. No tracheal tenderness or tracheal deviation.  Cardiovascular:     Rate and Rhythm: Normal rate and regular rhythm.     Heart sounds: Normal heart sounds, S1 normal and S2 normal. No murmur heard.   Pulmonary:     Effort: No respiratory distress.     Breath sounds: Normal breath sounds. No stridor. No wheezing or rales.  Lymphadenopathy:     Head:     Right side of head: No tonsillar adenopathy.     Left side of head: No tonsillar adenopathy.     Cervical: No cervical adenopathy.  Skin:    Findings: No erythema or rash.     Nails: There is no clubbing.  Neurological:     Mental Status: She is alert.     Diagnostics: Allergy skin tests were not performed.  Results of blood tests obtained 24 Feb 2021 identifies creatinine 0.83 mg/DL, AST 18 U/L, ALT 20 2U/L, WBC 7.7, absolute basophil 100, absolute eosinophil 100, absolute lymphocyte 2200, hemoglobin 11.0, platelet 256,  Assessment and Plan:    1. Pruritic  disorder   2. Dermatographia     1. Cetirizine 10 mg - 1-2 tablets 1-2 times per day (MAX=40m/day)  2. Blood - Thyroid Peroxidase ab, alpha-gal antibody   3. Contact clinic in 4 weeks or earlier if itching not controlled   It is not entirely clear why ABathshebahas a pruritic disorder with a component of dermatographia.  I have ordered a thyroid peroxidase antibody especially given the fact that her thyroid status has changed recently to look for thyroiditis contributing to this issue and will also see if she has developed some sensitivity to specific food consumption.  I think the best way to approach her issue is to have her find a dose of cetirizine that helps this issue and if we cannot get this under control with this simple approach then she will definitely require further evaluation and treatment.  We will see what happens over the course of the next several weeks.  EJiles Prows MD [facility]ergy / [facility]

## 2021-03-09 NOTE — Patient Instructions (Addendum)
  1. Cetirizine 10 mg - 1-2 tablets 1-2 times per day (MAX=40mg /day)  2. Blood - Thyroid Peroxidase ab, alpha-gal antibody   3. Contact clinic in 4 weeks or earlier if itching not controlled

## 2021-03-10 ENCOUNTER — Encounter: Payer: Self-pay | Admitting: Allergy and Immunology

## 2021-03-16 LAB — ALPHA-GAL PANEL
Allergen Lamb IgE: <0.1 kU/L
Beef IgE: <0.1 kU/L
IgE (Immunoglobulin E), Serum: 13 IU/mL (ref 6–495)
O215-IgE Alpha-Gal: <0.1 kU/L
Pork IgE: <0.1 kU/L

## 2021-03-16 LAB — THYROID PEROXIDASE ANTIBODY: Thyroperoxidase Ab SerPl-aCnc: 11 IU/mL (ref 0–34)

## 2021-03-18 ENCOUNTER — Telehealth: Payer: Self-pay

## 2021-03-18 NOTE — Telephone Encounter (Signed)
Error

## 2021-03-18 NOTE — Telephone Encounter (Signed)
Please inform and that based upon the fact that she has not done well while using 40 mg of cetirizine regarding her pruritus we can start her on gabapentin at 100 mg twice a day for 1 week and then she can contact this clinic with an update regarding her response to gabapentin.  We will slowly increase her dose of gabapentin over the course of the next month.

## 2021-03-18 NOTE — Telephone Encounter (Signed)
Talked with [name].  She is actually taking gabapentin 300mg  twice daily now for neuropathy.  I apologized to her and let her know that we did not have that on her medication list.  Please advise.

## 2021-03-18 NOTE — Telephone Encounter (Signed)
Called and advised patient of her lab results.  She states that she is still itching, having itching episodes everyday.  She is taking Zyrtec two tablets twice daily and thinks it might help a little, but is still with itching.  Please advise.

## 2021-03-21 NOTE — Telephone Encounter (Signed)
Please ask and when was it that she started Rubraca 300 MG tablet in relation to onset of itchiness?

## 2021-03-22 NOTE — Telephone Encounter (Signed)
I called patient and she stated she had started Rubraca 300 mg in May of this year but she was not sure. Looking back at the Oncology notes patient was started on Rubraca 300 mg since 07/2018.

## 2021-03-23 ENCOUNTER — Ambulatory Visit: Payer: Self-pay | Admitting: Allergy and Immunology

## 2021-04-04 ENCOUNTER — Other Ambulatory Visit (HOSPITAL_COMMUNITY): Payer: Self-pay

## 2021-04-07 ENCOUNTER — Other Ambulatory Visit (HOSPITAL_COMMUNITY): Payer: Self-pay

## 2021-04-15 ENCOUNTER — Encounter: Payer: Self-pay | Admitting: Hematology and Oncology

## 2021-04-21 ENCOUNTER — Telehealth: Payer: Self-pay | Admitting: Hematology and Oncology

## 2021-04-21 ENCOUNTER — Encounter: Payer: Self-pay | Admitting: Hematology and Oncology

## 2021-04-21 ENCOUNTER — Inpatient Hospital Stay: Payer: Medicare HMO | Attending: Hematology and Oncology

## 2021-04-21 ENCOUNTER — Other Ambulatory Visit: Payer: Self-pay

## 2021-04-21 ENCOUNTER — Inpatient Hospital Stay: Payer: Medicare HMO | Admitting: Hematology and Oncology

## 2021-04-21 DIAGNOSIS — C562 Malignant neoplasm of left ovary: Secondary | ICD-10-CM | POA: Insufficient documentation

## 2021-04-21 DIAGNOSIS — I10 Essential (primary) hypertension: Secondary | ICD-10-CM

## 2021-04-21 DIAGNOSIS — D6481 Anemia due to antineoplastic chemotherapy: Secondary | ICD-10-CM | POA: Diagnosis not present

## 2021-04-21 DIAGNOSIS — E039 Hypothyroidism, unspecified: Secondary | ICD-10-CM | POA: Insufficient documentation

## 2021-04-21 DIAGNOSIS — T451X5A Adverse effect of antineoplastic and immunosuppressive drugs, initial encounter: Secondary | ICD-10-CM

## 2021-04-21 LAB — COMPREHENSIVE METABOLIC PANEL
ALT: 14 U/L (ref 0–44)
AST: 22 U/L (ref 15–41)
Albumin: 3.5 g/dL (ref 3.5–5.0)
Alkaline Phosphatase: 49 U/L (ref 38–126)
Anion gap: 7 (ref 5–15)
BUN: 18 mg/dL (ref 8–23)
CO2: 24 mmol/L (ref 22–32)
Calcium: 8.9 mg/dL (ref 8.9–10.3)
Chloride: 107 mmol/L (ref 98–111)
Creatinine, Ser: 0.82 mg/dL (ref 0.44–1.00)
GFR, Estimated: >60 mL/min (ref >60)
Glucose, Bld: 89 mg/dL (ref 70–99)
Potassium: 4.8 mmol/L (ref 3.5–5.1)
Sodium: 138 mmol/L (ref 135–145)
Total Bilirubin: 0.8 mg/dL (ref 0.3–1.2)
Total Protein: 6.2 g/dL — ABNORMAL LOW (ref 6.5–8.1)

## 2021-04-21 LAB — CBC WITH DIFFERENTIAL/PLATELET
Abs Immature Granulocytes: 0.04 10*3/uL (ref 0.00–0.07)
Basophils Absolute: 0.1 10*3/uL (ref 0.0–0.1)
Basophils Relative: 1 %
Eosinophils Absolute: 0.1 10*3/uL (ref 0.0–0.5)
Eosinophils Relative: 2 %
HCT: 32.8 % — ABNORMAL LOW (ref 36.0–46.0)
Hemoglobin: 11.1 g/dL — ABNORMAL LOW (ref 12.0–15.0)
Immature Granulocytes: 1 %
Lymphocytes Relative: 35 %
Lymphs Abs: 2.1 10*3/uL (ref 0.7–4.0)
MCH: 35.6 pg — ABNORMAL HIGH (ref 26.0–34.0)
MCHC: 33.8 g/dL (ref 30.0–36.0)
MCV: 105.1 fL — ABNORMAL HIGH (ref 80.0–100.0)
Monocytes Absolute: 0.4 10*3/uL (ref 0.1–1.0)
Monocytes Relative: 7 %
Neutro Abs: 3.2 10*3/uL (ref 1.7–7.7)
Neutrophils Relative %: 54 %
Platelets: 178 10*3/uL (ref 150–400)
RBC: 3.12 MIL/uL — ABNORMAL LOW (ref 3.87–5.11)
RDW: 15.1 % (ref 11.5–15.5)
WBC: 5.9 10*3/uL (ref 4.0–10.5)
nRBC: 0 % (ref 0.0–0.2)

## 2021-04-21 MED ORDER — HEPARIN SOD (PORK) LOCK FLUSH 100 UNIT/ML IV SOLN
500.0000 [IU] | Freq: Once | INTRAVENOUS | Status: AC
  Administered 2021-04-21: 500 [IU]
  Filled 2021-04-21: qty 5

## 2021-04-21 MED ORDER — SODIUM CHLORIDE 0.9% FLUSH
10.0000 mL | Freq: Once | INTRAVENOUS | Status: AC
Start: 2021-04-21 — End: 2021-04-21
  Administered 2021-04-21: 10 mL
  Filled 2021-04-21: qty 10

## 2021-04-21 NOTE — Assessment & Plan Note (Signed)
She has mild intermittent fluctuation of TSH I will defer to her primary care for management

## 2021-04-21 NOTE — Assessment & Plan Note (Signed)
Her blood pressure is mildly elevated today, could be exacerbated by slight anxiety and whitecoat hypertension She will continue her prescribed blood pressure medications for now

## 2021-04-21 NOTE — Assessment & Plan Note (Signed)
Her examination is benign She is not symptomatic We will continue treatment as scheduled She will return in 8 weeks with blood draw, port flush and GYN oncology exam I will see her again in 16 weeks for further follow-up

## 2021-04-21 NOTE — Assessment & Plan Note (Signed)
The cause of her anemia is multifactorial, combination of anemia chronic illness and recent surgery She is not bleeding and not symptomatic Observe only for now

## 2021-04-21 NOTE — Progress Notes (Signed)
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) Her examination is benign She is not symptomatic We will continue treatment as scheduled She will return in 8 weeks with blood draw, port flush and GYN oncology exam I will see her again in 16 weeks for further follow-up  Anemia due to antineoplastic chemotherapy The cause of her anemia is multifactorial, combination of anemia chronic illness and recent surgery She is not bleeding and not symptomatic Observe only for now  Acquired hypothyroidism She has mild intermittent fluctuation of TSH I will defer to her primary care for management  Essential hypertension Her blood pressure is mildly elevated today, could be exacerbated by slight anxiety and whitecoat hypertension She will continue her prescribed blood pressure medications for now  No orders of the defined types were placed in this encounter.   All questions were answered. The patient knows to call the clinic with any problems, questions or concerns. The total time spent in the appointment was 20 minutes encounter with patients including review of chart and various tests results, discussions about plan of care and coordination of care plan   Heath Lark, MD 04/21/2021 12:19 PM  INTERVAL HISTORY: Please see below for problem oriented charting. 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 Ovary and  fallopian tube, left - SEROUS CYSTADENOCARCINOMA, HIGH GRADE, SPANNING APPROXIMATELY 11 CM. - TUMOR INVOLVES OVARY SURFACE AND LEFT FALLOPIAN TUBE. - SEE ONCOLOGY TABLE. 2. Mesentery, small bowel mesentery biopsy #1 - BENIGN FIBROADIPOSE TISSUE. 3. Ovary and fallopian tube, right - BENIGN OVARY WITH INCLUSION CYSTS. - BENIGN FALLOPIAN TUBE WITH PARATUBAL CYSTS AND ADENOFIBROMA. 4. Omentum, resection for tumor - BENIGN ADIPOSE TISSUE. 5. Peritoneum, biopsy, left diaphragmatic - BENIGN PERITONEAL TYPE TISSUE. 6. Peritoneum, biopsy, right diaphragmatic - BENIGN PERITONEAL TYPE TISSUE. 7. Mesentery, small bowel mesenteric biopsy #2 - BENIGN FIBROADIPOSE TISSUE. 8. Lymph nodes, regional resection, right pelvic - FIVE OF FIVE LYMPH NODES NEGATIVE FOR CARCINOMA (0/5). 9. Lymph nodes, regional resection, left pelvic - FOUR OF FOUR LYMPH NODES NEGATIVE FOR CARCINOMA (0/4). 10. Peritoneum, biopsy, left gutter - BENIGN PERITONEAL TYPE TISSUE. 11. Peritoneum, biopsy, bladder - BENIGN PERITONEAL TYPE TISSUE WITH ACUTE INFLAMMATION AND CALCIFICATIONS.  12. Peritoneum, biopsy, cul-de-sac - BENIGN PERITONEAL TYPE TISSUE WITH ACUTE INFLAMMATION AND CALCIFICATIONS. 13. Soft tissue, biopsy, right gutter - BENIGN FIBROADIPOSE TISSUE. 14. Lymph node, biopsy, right para-aortic - TWO OF TWO LYMPH NODES NEGATIVE FOR CARCINOMA (0/2). 15. Lymph node, biopsy, left para-aortic - ONE OF ONE LYMPH NODES NEGATIVE FOR CARCINOMA (0/1). Microscopic Comment 1. OVARY Specimen(s): Left ovary and fallopian tube. Procedure: (including lymph node sampling): Bilateral salpingo-oophorectomy with omental and peritoneal biopsies and lymph node biopsies. Primary tumor site (including laterality): Left ovary. Ovarian surface involvement: Present. Ovarian capsule intact without fragmentation: Intact. Maximum tumor size (cm): 11 cm total. Histologic type: Serous cystadenocarcinoma. Grade: High grade (low grade areas  also present). Peritoneal implants: (specify invasive  or non-invasive): N/A. Pelvic extension (list additional structures on separate lines and if involved): Left fallopian tube. Lymph nodes: number examined 12 ; number positive 0 TNM code: pT2a, pN0, pMX FIGO Stage (based on pathologic findings, needs clinical correlation): IIA Comments: None.    01/31/2018 Pathology Results   PERITONEAL WASHING (SPECIMEN 1 OF 1 COLLECTED 01/31/18): MALIGNANT CELLS PRESENT CONSISTENT WITH METASTATIC CARCINOMA.    01/31/2018 Surgery   Procedure(s) Performed:  1. Robotic BSO and washings. 2. Exploratory laparotomy, Staging including infragastric omentectomy, Bilateral pelvic and paraaortic lymphadenectomy, pertioneal biopsies.    Specimens: Bilateral tubes / ovaries, bilateral pelvic and paraaortic lymph nodes, peritoneal biopsies, washings and omentum.  Operative Findings: The left adnexa was adherent to the left pelvic peritoneum suspected due to the inferior retraction from the vaginal hysterectomy. Intraoperative leakage of cystic fluid from left ovary.  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  The right adnexa grossly appeared normal although slightly enlarged for her age.  There were some indurated lymph nodes but no overtly malignant lymph nodes were suspected.  Small bowel mesentery had 3-4 superficial possible implants.  1 of these was sent for frozen section returned benign.  She did have adhesive disease from her open cholecystectomy in the right upper quadrant.  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 16 mm exophytic lesion upper pole right kidney has attenuation too high to be a simple cyst. This may be a cyst complicated by proteinaceous debris or hemorrhage, but renal cell carcinoma is a concern. Routine outpatient follow-up MRI of the abdomen without and with contrast recommended after resolution of patient's acute symptoms (so she is better able to participate with positioning and breath holding). 2. Mild edema/possible minimal hemorrhage in the extraperitoneal soft tissues of the pelvic for tracking up both pelvic sidewalls. Imaging appearance is not outside of the spectrum of findings expected 6 days after the reported surgery. Trace interloop mesenteric and free fluid is identified in the peritoneal cavity in there is some mild edema in the omentum. There is no organized or rim enhancing collection to suggest evolving abscess. 3. Gas in the extraperitoneal soft tissues of the left lower quadrant is associated with gas in the subcutaneous fat of the lower left anterior abdominal wall. This is not unexpected on postoperative day 6. No evidence for intraperitoneal free air. 4. No CT features to suggest small bowel obstruction. Oral contrast material has migrated about halfway through the small bowel loops but there is no differential distention of proximal versus distal small bowel. The colon is diffusely distended and fluid-filled proximally, but formed stool is noted in the distal colon. Given apparent slow migration of contrast and fluid-filled small and large bowel loops, a component of ileus is possible.    02/14/2018 Imaging   MR abdomen Benign Bosniak category 2 cyst in upper pole of right kidney, corresponding with lesion seen on previous CT. No evidence of renal neoplasm or other significant abnormality.      02/18/2018 Cancer Staging   Staging form: Ovary, Fallopian Tube, and  Primary Peritoneal Carcinoma, AJCC 8th Edition -  Pathologic: Stage II (pT2, pN0, cM0) - Signed by Heath Lark, MD on 02/18/2018    02/28/2018 Procedure   Status post right IJ port catheter placement. 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 For last cycle, taxol was omitted    07/18/2018 Imaging   1. No evidence for residual or recurrent tumor within the abdomen or pelvis. There has been interval resolution edema and fluid within the peritoneal cavity. 2. New pulmonary nodule is identified within the right lower lobe measuring 1.1 cm, image 87/4. Consider one of the following in 3 months for both low-risk and high-risk individuals: (a) repeat chest CT, (b) follow-up PET-CT, or (c) tissue sampling. This recommendation follows the consensus statement: Guidelines for Management of Incidental Pulmonary Nodules Detected on CT Images: From the Fleischner Society 2017; Radiology 2017; 284:228-243. 3. Stable 7 mm nodule within the right upper lobe. 4.  Aortic Atherosclerosis (ICD10-I70.0). 5. Multi vessel coronary artery atherosclerotic calcifications.    07/18/2018 Tumor Marker   Patient's tumor was tested for the following markers: CA-125 Results of the tumor marker test revealed 14.9    07/25/2018 PET scan   1. Regressing right lower lobe peripheral lung lesion, likely resolving inflammatory or atelectatic process. No hypermetabolism to suggest malignancy. I would recommend a follow-up noncontrast chest CT and 4-6 months. Small upper lobe pulmonary nodules are stable. 2. 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    07/31/2018 -  Chemotherapy   The patient started taking rucaparib    10/25/2018 Tumor  Marker   Patient's tumor was tested for the following markers: CA-125 Results of the tumor marker test revealed 12.1    11/25/2018 Imaging   1. Right lower lobe nodule has resolved. Additional right lung nodules are stable. 2. Stable hazy nodularity along the left external iliac chain.  3. Hepatomegaly. 4. Aortic atherosclerosis (ICD10-170.0). Coronary artery calcification. 5. Enlarged pulmonic trunk, indicative of pulmonary arterial hypertension.    01/16/2019 Tumor Marker   Patient's tumor was tested for the following markers: CA-125 Results of the tumor marker test revealed 13.1    05/22/2019 Tumor Marker   Patient's tumor was tested for the following markers: CA-125 Results of the tumor marker test revealed 14.4   06/24/2019 Imaging   Ct chest, abdomen and pelvis 1. There are multiple clustered tiny centrilobular nodules and irregular opacities of the bilateral lower lobes and lingula, increased compared to prior examination, with underlying bandlike scarring of the medial right middle lobe and right lung base (e.g. series 4, image 100, 110, 123). 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  2. Stable 6 mm pulmonary nodule of the right upper lobe (series 4, image 45). Attention on follow-up.   3. Status post hysterectomy and oophorectomy. No evidence of recurrent or metastatic disease in the abdomen or pelvis.   4. There are occasional sigmoid diverticula and focal fat stranding about a diverticulum of the mid sigmoid (series 2, image 103). Correlate for signs and symptoms of acute diverticulitis.   5. Other chronic, incidental, and postoperative findings as detailed above.   06/24/2019 Tumor Marker   Patient's tumor  was tested for the following markers: CA-125 Results of the tumor marker test revealed 14.4.   08/19/2019 Tumor Marker   Patient's tumor was tested for the following markers: CA-125 Results of the tumor marker test  revealed 10.3   09/25/2019 Imaging   CT chest 1. Interval resolution of previously demonstrated clustered centrilobular nodularity at both lung bases consistent with resolved inflammation. Stable 6 mm right upper lobe nodule consistent with a benign finding. No new or enlarging pulmonary nodules.  2. Stable chronic bronchiectasis and scarring in the right middle lobe and lingula. 3. No evidence of metastatic disease. 4. Aortic Atherosclerosis (ICD10-I70.0).   10/30/2019 Tumor Marker   Patient's tumor was tested for the following markers: CA-125 Results of the tumor marker test revealed 12.1   12/25/2019 Tumor Marker   Patient's tumor was tested for the following markers: CA-125. Results of the tumor marker test revealed 14.8.   02/19/2020 Tumor Marker   Patient's tumor was tested for the following markers: CA-125. Results of the tumor marker test revealed 15.5   04/22/2020 Tumor Marker   Patient's tumor was tested for the following markers: CA-125 Results of the tumor marker test revealed 8.9   06/16/2020 Tumor Marker   Patient's tumor was tested for the following markers: CA-125 Results of the tumor marker test revealed 12.5   06/23/2020 Imaging   1. Stable exam. No new or progressive findings to suggest recurrent or metastatic disease. 2. Aortic Atherosclerosis (ICD10-I70.0).   08/19/2020 Tumor Marker   Patient's tumor was tested for the following markers: CA-125 Results of the tumor marker test revealed 19.1   09/21/2020 Tumor Marker   Patient's tumor was tested for the following markers: CA-125 Results of the tumor marker test revealed 12.7.   11/17/2020 Tumor Marker   Patient's tumor was tested for the following markers: CA-125 Results of the tumor marker test revealed 14.7   01/11/2021 Tumor Marker   Patient's tumor was tested for the following markers: CA-125 Results of the tumor marker test revealed 11.6   02/24/2021 Tumor Marker   Patient's tumor was tested for the  following markers: CA125. Results of the tumor marker test revealed 11.8     REVIEW OF SYSTEMS:   Constitutional: Denies fevers, chills or abnormal weight loss Eyes: Denies blurriness of vision Ears, nose, mouth, throat, and face: Denies mucositis or sore throat Respiratory: Denies cough, dyspnea or wheezes Cardiovascular: Denies palpitation, chest discomfort or lower extremity swelling Gastrointestinal:  Denies nausea, heartburn or change in bowel habits Skin: Denies abnormal skin rashes Lymphatics: Denies new lymphadenopathy or easy bruising Neurological:Denies numbness, tingling or new weaknesses Behavioral/Psych: Mood is stable, no new changes  All other systems were reviewed with the patient and are negative.  I have reviewed the past medical history, past surgical history, social history and family history with the patient and they are unchanged from previous note.  ALLERGIES:  is allergic to penicillins and rosuvastatin.  MEDICATIONS:  Current Outpatient Medications  Medication Sig Dispense Refill   acetaminophen (TYLENOL) 500 MG tablet Take 1 tablet (500 mg total) by mouth every 6 (six) hours as needed for moderate pain. 100 tablet 2   ALPRAZolam (XANAX) 0.5 MG tablet Take 0.5 mg by mouth at bedtime as needed for sleep.     amLODipine (NORVASC) 5 MG tablet Take 5 mg by mouth daily.     Cyanocobalamin 1000 MCG/ML KIT Inject 1,000 mcg as directed every 14 (fourteen) days.     famotidine (PEPCID) 40 MG tablet Take  by mouth.     FLUoxetine (PROZAC) 20 MG capsule Take 20 mg by mouth daily after breakfast.     glimepiride (AMARYL) 1 MG tablet Take 1 mg by mouth daily as needed. If CBG is elevated     hydrochlorothiazide (HYDRODIURIL) 25 MG tablet Take 25 mg by mouth daily.     levothyroxine (SYNTHROID) 100 MCG tablet Take 1 tablet by mouth daily.     linaclotide (LINZESS) 145 MCG CAPS capsule Take 145 mcg by mouth daily before breakfast.     losartan (COZAAR) 100 MG tablet Take  100 mg by mouth at bedtime.      Melatonin 5 MG TABS Take 5-10 mg by mouth at bedtime as needed (sleep).      metFORMIN (GLUCOPHAGE-XR) 500 MG 24 hr tablet Take 1,000 mg by mouth 2 (two) times daily.      RUBRACA 300 MG tablet TAKE 1 TABLET (300 MG TOTAL) BY MOUTH 2 (TWO) TIMES DAILY. 60 tablet 11   traMADol (ULTRAM) 50 MG tablet tramadol 50 mg tablet  Take 1 tablet twice a day by oral route as needed for 10 days.     No current facility-administered medications for this visit.    PHYSICAL EXAMINATION: ECOG PERFORMANCE STATUS: 0 - Asymptomatic  Vitals:   04/21/21 1055  BP: (!) 156/60  Pulse: 62  Resp: 18  Temp: 97.6 F (36.4 C)  SpO2: 100%   Filed Weights   04/21/21 1055  Weight: 163 lb 12.8 oz (74.3 kg)    GENERAL:alert, no distress and comfortable SKIN: skin color, texture, turgor are normal, no rashes or significant lesions EYES: normal, Conjunctiva are pink and non-injected, sclera clear OROPHARYNX:no exudate, no erythema and lips, buccal mucosa, and tongue normal  NECK: supple, thyroid normal size, non-tender, without nodularity LYMPH:  no palpable lymphadenopathy in the cervical, axillary or inguinal LUNGS: clear to auscultation and percussion with normal breathing effort HEART: regular rate & rhythm and no murmurs and no lower extremity edema ABDOMEN:abdomen soft, non-tender and normal bowel sounds Musculoskeletal:no cyanosis of digits and no clubbing  NEURO: alert & oriented x 3 with fluent speech, no focal motor/sensory deficits  LABORATORY DATA:  I have reviewed the data as listed    Component Value Date/Time   NA 138 04/21/2021 1022   K 4.8 04/21/2021 1022   CL 107 04/21/2021 1022   CO2 24 04/21/2021 1022   GLUCOSE 89 04/21/2021 1022   BUN 18 04/21/2021 1022   CREATININE 0.82 04/21/2021 1022   CREATININE 0.93 02/13/2019 1024   CALCIUM 8.9 04/21/2021 1022   PROT 6.2 (L) 04/21/2021 1022   ALBUMIN 3.5 04/21/2021 1022   AST 22 04/21/2021 1022   AST 26  02/13/2019 1024   ALT 14 04/21/2021 1022   ALT 23 02/13/2019 1024   ALKPHOS 49 04/21/2021 1022   BILITOT 0.8 04/21/2021 1022   BILITOT 0.8 02/13/2019 1024   GFRNONAA >60 04/21/2021 1022   GFRNONAA 59 (L) 02/13/2019 1024   GFRAA >60 06/16/2020 0918   GFRAA >60 02/13/2019 1024    No results found for: SPEP, UPEP  Lab Results  Component Value Date   WBC 5.9 04/21/2021   NEUTROABS 3.2 04/21/2021   HGB 11.1 (L) 04/21/2021   HCT 32.8 (L) 04/21/2021   MCV 105.1 (H) 04/21/2021   PLT 178 04/21/2021      Chemistry      Component Value Date/Time   NA 138 04/21/2021 1022   K 4.8 04/21/2021 1022   CL 107  04/21/2021 1022   CO2 24 04/21/2021 1022   BUN 18 04/21/2021 1022   CREATININE 0.82 04/21/2021 1022   CREATININE 0.93 02/13/2019 1024      Component Value Date/Time   CALCIUM 8.9 04/21/2021 1022   ALKPHOS 49 04/21/2021 1022   AST 22 04/21/2021 1022   AST 26 02/13/2019 1024   ALT 14 04/21/2021 1022   ALT 23 02/13/2019 1024   BILITOT 0.8 04/21/2021 1022   BILITOT 0.8 02/13/2019 1024

## 2021-04-21 NOTE — Telephone Encounter (Signed)
Scheduled appointment per 07/07 sch msg. Patient is aware.

## 2021-04-22 ENCOUNTER — Telehealth: Payer: Self-pay | Admitting: Oncology

## 2021-04-22 LAB — CA 125: Cancer Antigen (CA) 125: 10.6 U/mL (ref 0.0–38.1)

## 2021-04-22 NOTE — Telephone Encounter (Signed)
Advised [name] of good CA 125 results.  She verbalized understanding and agreement.

## 2021-05-02 ENCOUNTER — Other Ambulatory Visit (HOSPITAL_COMMUNITY): Payer: Self-pay

## 2021-05-09 ENCOUNTER — Telehealth: Payer: Self-pay | Admitting: Oncology

## 2021-05-09 ENCOUNTER — Other Ambulatory Visit (HOSPITAL_COMMUNITY): Payer: Self-pay

## 2021-05-09 ENCOUNTER — Other Ambulatory Visit: Payer: Self-pay | Admitting: Hematology and Oncology

## 2021-05-09 DIAGNOSIS — G620 Drug-induced polyneuropathy: Secondary | ICD-10-CM

## 2021-05-09 MED ORDER — GABAPENTIN 300 MG PO CAPS: 300.0000 mg | ORAL_CAPSULE | Freq: Two times a day (BID) | ORAL | 2 refills | Status: DC

## 2021-05-09 NOTE — Telephone Encounter (Signed)
I sent gabapentin 300 mg BID Warn her about risks of sedation and constipation, call if she experience difficulties with either symptom

## 2021-05-09 NOTE — Telephone Encounter (Signed)
Called Judieth back and advised her that refill has been sent and to watch out for sedation and constipation and to call us if she experiences any difficulties with either one.

## 2021-05-09 NOTE — Telephone Encounter (Signed)
[name] left a message asking for a refill of gabapentin 300 mg.   She said her feet and two fingers are "giving her a fit."  She had some gabapentin left and tried them and it did help.  She would like it sent to [facility] if possible.

## 2021-05-31 ENCOUNTER — Other Ambulatory Visit (HOSPITAL_COMMUNITY): Payer: Self-pay

## 2021-06-08 ENCOUNTER — Other Ambulatory Visit (HOSPITAL_COMMUNITY): Payer: Self-pay

## 2021-06-15 ENCOUNTER — Telehealth: Payer: Self-pay | Admitting: *Deleted

## 2021-06-15 NOTE — Telephone Encounter (Signed)
Called and left the patient a message to call the office back regarding her appt for 9/2 that needs to abe canceled.

## 2021-06-16 ENCOUNTER — Encounter: Payer: Self-pay | Admitting: Hematology and Oncology

## 2021-06-16 NOTE — Telephone Encounter (Signed)
Reached the patient and canceled appt for tomorrow. Explained that the office will call back to reschedule the appt

## 2021-06-17 ENCOUNTER — Inpatient Hospital Stay: Payer: Medicare HMO

## 2021-06-17 ENCOUNTER — Inpatient Hospital Stay: Payer: Medicare HMO | Admitting: Gynecologic Oncology

## 2021-06-17 ENCOUNTER — Telehealth: Payer: Self-pay | Admitting: Oncology

## 2021-06-17 NOTE — Telephone Encounter (Signed)
[name] called and asked if she still needs her port flush apt even though her apt with Dr. Berline Lopes has been canceled for today.  Rescheduled port flush/lab apt to 06/22/21 at 9:00.

## 2021-06-21 ENCOUNTER — Telehealth: Payer: Self-pay | Admitting: *Deleted

## 2021-06-21 NOTE — Telephone Encounter (Signed)
Spoke with the patient and rescheduled her appt with Dr Berline Lopes

## 2021-06-22 ENCOUNTER — Other Ambulatory Visit: Payer: Medicare HMO

## 2021-06-23 NOTE — Progress Notes (Signed)
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 - TUMOR INVOLVES OVARY SURFACE AND LEFT FALLOPIAN TUBE. - SEE ONCOLOGY TABLE. 2. Mesentery, small bowel mesentery biopsy #1 - BENIGN FIBROADIPOSE TISSUE. 3. Ovary and fallopian tube, right - BENIGN OVARY WITH INCLUSION CYSTS. - BENIGN FALLOPIAN TUBE WITH PARATUBAL CYSTS AND ADENOFIBROMA. 4. Omentum, resection for tumor - BENIGN ADIPOSE TISSUE. 5. Peritoneum, biopsy, left diaphragmatic - BENIGN PERITONEAL TYPE TISSUE. 6. Peritoneum, biopsy, right diaphragmatic - BENIGN PERITONEAL TYPE TISSUE. 7. Mesentery, small bowel mesenteric biopsy #2 - BENIGN FIBROADIPOSE TISSUE. 8. Lymph nodes, regional resection, right pelvic - FIVE OF FIVE LYMPH NODES NEGATIVE FOR CARCINOMA (0/5). 9. Lymph nodes, regional resection, left pelvic - FOUR OF FOUR LYMPH NODES NEGATIVE FOR CARCINOMA (0/4). 10. Peritoneum, biopsy, left gutter - BENIGN PERITONEAL TYPE TISSUE. 11. Peritoneum, biopsy, bladder - BENIGN PERITONEAL TYPE TISSUE WITH ACUTE INFLAMMATION AND CALCIFICATIONS.  12. Peritoneum, biopsy, cul-de-sac - BENIGN PERITONEAL TYPE TISSUE WITH ACUTE INFLAMMATION AND CALCIFICATIONS. 13. Soft tissue, biopsy, right gutter - BENIGN FIBROADIPOSE TISSUE. 14. Lymph node, biopsy, right para-aortic - TWO OF TWO LYMPH NODES NEGATIVE FOR CARCINOMA (0/2). 15. Lymph node, biopsy, left para-aortic - ONE OF ONE LYMPH NODES NEGATIVE FOR CARCINOMA (0/1). Microscopic  Comment 1. OVARY Specimen(s): Left ovary and fallopian tube. Procedure: (including lymph node sampling): Bilateral salpingo-oophorectomy with omental and peritoneal biopsies and lymph node biopsies. Primary tumor site (including laterality): Left ovary. Ovarian surface involvement: Present. Ovarian capsule intact without fragmentation: Intact. Maximum tumor size (cm): 11 cm total. Histologic type: Serous cystadenocarcinoma. Grade: High grade (low grade areas also present). Peritoneal implants: (specify invasive or non-invasive): N/A. Pelvic extension (list additional structures on separate lines and if involved): Left fallopian tube. Lymph nodes: number examined 12 ; number positive 0 TNM code: pT2a, pN0, pMX FIGO Stage (based on pathologic findings, needs clinical correlation): IIA Comments: None.   01/31/2018 Pathology Results   PERITONEAL WASHING (SPECIMEN 1 OF 1 COLLECTED 01/31/18): MALIGNANT CELLS PRESENT CONSISTENT WITH METASTATIC CARCINOMA.   01/31/2018 Surgery   Procedure(s) Performed:  1. Robotic BSO and washings. 2. Exploratory laparotomy, Staging including infragastric omentectomy, Bilateral pelvic and paraaortic lymphadenectomy, pertioneal biopsies.    Specimens: Bilateral tubes / ovaries, bilateral pelvic and paraaortic lymph nodes, peritoneal biopsies, washings and omentum.  Operative Findings: The left adnexa was adherent to the left pelvic peritoneum suspected due to the inferior retraction from the vaginal hysterectomy. Intraoperative leakage of cystic fluid from left ovary.  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  The right adnexa grossly appeared normal although slightly enlarged for her age.  There were some indurated lymph nodes but no overtly malignant lymph nodes were suspected.  Small bowel mesentery had 3-4 superficial possible implants.  1 of these was sent for frozen section returned benign.  She did have adhesive  disease from her open cholecystectomy in the right upper quadrant.  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 16 mm exophytic lesion upper pole right kidney has attenuation too high to be a simple cyst. This may be a cyst complicated by proteinaceous debris or hemorrhage, but renal cell carcinoma is a concern. Routine outpatient follow-up MRI of the abdomen without and with contrast recommended after resolution of patient's acute symptoms (so she is better able to participate with positioning and breath holding). 2. Mild edema/possible minimal hemorrhage in the extraperitoneal soft tissues of the pelvic for tracking up both pelvic sidewalls. Imaging appearance is not outside of the spectrum of findings expected 6 days after the reported surgery. Trace interloop mesenteric and free fluid is identified in the peritoneal cavity in there is some mild edema in the omentum. There is no organized or rim enhancing collection to suggest evolving abscess. 3. Gas in the extraperitoneal soft tissues of the left lower quadrant is associated with gas in the subcutaneous fat of the lower left anterior abdominal wall. This is not unexpected on postoperative day 6. No evidence for intraperitoneal free air. 4. No CT features to suggest small bowel obstruction. Oral contrast material has migrated about halfway through the small bowel loops but there is no differential distention of proximal versus distal small bowel. The colon is diffusely distended and fluid-filled proximally, but formed stool is noted in the distal colon. Given  apparent slow migration of contrast and fluid-filled small and large bowel loops, a component of ileus is possible.   02/14/2018 Imaging   MR abdomen Benign Bosniak category 2 cyst in upper pole of right kidney, corresponding with lesion seen on previous CT. No evidence of renal neoplasm or other significant abnormality.     02/18/2018 Cancer Staging   Staging form: Ovary, Fallopian Tube, and Primary Peritoneal Carcinoma, AJCC 8th Edition - Pathologic: Stage II (pT2, pN0, cM0) - Signed by Gorsuch, Ni, MD on 02/18/2018   02/28/2018 Procedure   Status post right IJ port catheter placement. 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 For last cycle, taxol was omitted   07/18/2018 Imaging   1. No evidence for residual or recurrent tumor within the abdomen or pelvis. There has been interval resolution edema and fluid within the peritoneal cavity. 2. New pulmonary nodule is identified within the right lower lobe measuring 1.1 cm, image 87/4. Consider one of the following in 3 months for both low-risk and high-risk individuals: (a) repeat chest CT, (b) follow-up PET-CT, or (c) tissue sampling. This recommendation follows the consensus statement: Guidelines for Management of Incidental Pulmonary Nodules Detected on CT Images: From the Fleischner Society 2017; Radiology 2017; 284:228-243. 3. Stable 7 mm nodule within the right upper lobe. 4.  Aortic Atherosclerosis (ICD10-I70.0). 5. Multi vessel coronary artery atherosclerotic calcifications.   07/18/2018 Tumor Marker   Patient's tumor was tested for the following markers: CA-125 Results of the tumor marker test revealed 14.9   07/25/2018 PET scan   1. Regressing right lower lobe peripheral   lung lesion, likely  resolving inflammatory or atelectatic process. No hypermetabolism to suggest malignancy. I would recommend a follow-up noncontrast chest CT and 4-6 months. Small upper lobe pulmonary nodules are stable. 2. 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   07/31/2018 -  Chemotherapy   The patient started taking rucaparib   10/25/2018 Tumor Marker   Patient's tumor was tested for the following markers: CA-125 Results of the tumor marker test revealed 12.1   11/25/2018 Imaging   1. Right lower lobe nodule has resolved. Additional right lung nodules are stable. 2. Stable hazy nodularity along the left external iliac chain.  3. Hepatomegaly. 4. Aortic atherosclerosis (ICD10-170.0). Coronary artery calcification. 5. Enlarged pulmonic trunk, indicative of pulmonary arterial hypertension.   01/16/2019 Tumor Marker   Patient's tumor was tested for the following markers: CA-125 Results of the tumor marker test revealed 13.1   05/22/2019 Tumor Marker   Patient's tumor was tested for the following markers: CA-125 Results of the tumor marker test revealed 14.4   06/24/2019 Imaging   Ct chest, abdomen and pelvis 1. There are multiple clustered tiny centrilobular nodules and irregular opacities of the bilateral lower lobes and lingula, increased compared to prior examination, with underlying bandlike scarring of the medial right middle lobe and right lung base (e.g. series 4, image 100, 110, 123). 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  2. Stable 6 mm pulmonary nodule of the right upper lobe (series 4, image 45). Attention on follow-up.   3. Status post hysterectomy and oophorectomy. No evidence of recurrent or metastatic disease in the abdomen or pelvis.   4. There are occasional sigmoid diverticula and focal fat stranding about a diverticulum of the mid  sigmoid (series 2, image 103). Correlate for signs and symptoms of acute diverticulitis.   5. Other chronic, incidental, and postoperative findings as detailed above.   06/24/2019 Tumor Marker   Patient's tumor was tested for the following markers: CA-125 Results of the tumor marker test revealed 14.4.   08/19/2019 Tumor Marker   Patient's tumor was tested for the following markers: CA-125 Results of the tumor marker test revealed 10.3   09/25/2019 Imaging   CT chest 1. Interval resolution of previously demonstrated clustered centrilobular nodularity at both lung bases consistent with resolved inflammation. Stable 6 mm right upper lobe nodule consistent with a benign finding. No new or enlarging pulmonary nodules.  2. Stable chronic bronchiectasis and scarring in the right middle lobe and lingula. 3. No evidence of metastatic disease. 4. Aortic Atherosclerosis (ICD10-I70.0).   10/30/2019 Tumor Marker   Patient's tumor was tested for the following markers: CA-125 Results of the tumor marker test revealed 12.1   12/25/2019 Tumor Marker   Patient's tumor was tested for the following markers: CA-125. Results of the tumor marker test revealed 14.8.   02/19/2020 Tumor Marker   Patient's tumor was tested for the following markers: CA-125. Results of the tumor marker test revealed 15.5   04/22/2020 Tumor Marker   Patient's tumor was tested for the following markers: CA-125 Results of the tumor marker test revealed 8.9   06/16/2020 Tumor Marker   Patient's tumor was tested for the following markers: CA-125 Results of the tumor marker test revealed 12.5   06/23/2020 Imaging   1. Stable exam. No new or progressive findings to suggest recurrent or metastatic disease. 2. Aortic Atherosclerosis (ICD10-I70.0).   08/19/2020 Tumor Marker   Patient's   tumor was tested for the following markers: CA-125 Results of the tumor marker test revealed 19.1   09/21/2020 Tumor Marker   Patient's tumor was tested  for the following markers: CA-125 Results of the tumor marker test revealed 12.7.   11/17/2020 Tumor Marker   Patient's tumor was tested for the following markers: CA-125 Results of the tumor marker test revealed 14.7   01/11/2021 Tumor Marker   Patient's tumor was tested for the following markers: CA-125 Results of the tumor marker test revealed 11.6   02/24/2021 Tumor Marker   Patient's tumor was tested for the following markers: CA125. Results of the tumor marker test revealed 11.8   04/21/2021 Tumor Marker   Patient's tumor was tested for the following markers: CA-125. Results of the tumor marker test revealed 10.9.     Interval History: Finished adjuvant chemotherapy in 06/2009. She has been on Rubraca since 07/2018, tolerating well. BRCA mutation negative, RAD51C promoter hypermethylation present.   Patient reports she is doing well.  She fractured her back in April and had surgery.  She has regained full function now after initially having some difficulty using her right leg.  She is back to gardening which she enjoys very much.  She endorses a good appetite without nausea or emesis.  She is on Linzess for history of constipation but thinks that her dose may need to be lowered as she now wakes up with fecal urgency, often has diarrhea in the morning and has had some episodes of incontinence.  She denies any bladder symptoms.  She denies any vaginal bleeding or discharge.  Her weight fluctuates up and down about 5 pounds but she reports this is baseline for her.  Went to [employer] last week, [place], with a close friend.  Past Medical/Surgical History: Past Medical History:  Diagnosis Date   Arthritis    Diabetes mellitus without complication ([place])    type 2    GERD (gastroesophageal reflux disease)    Hypertension    Hypothyroidism    Ovarian cancer ([place]) 01/31/2018   Pneumonia    its been 4 years    PONV (postoperative nausea and vomiting)    Urticaria    Vitamin B 12  deficiency     Past Surgical History:  Procedure Laterality Date   BLADDER SUSPENSION  2002   BREAST SURGERY     several benign cysts removed; surgeries from [place] N/A 02/09/2021   Procedure: DECOMPRESSIVE LUMBAR LAMINECTOMY LEVEL 1 lumbar three-four;  Surgeon: Melina Schools, MD;  Location: [facility];  Service: Orthopedics;  Laterality: N/A;   EYE SURGERY  2010 amd 2012   cataracts removed bilateral    IR FLUORO GUIDE PORT INSERTION RIGHT  02/28/2018   IR US GUIDE VASC ACCESS RIGHT  02/28/2018   KNEE ARTHROSCOPY Left    l knee x2   KYPHOPLASTY N/A 02/09/2021   Procedure: KYPHOPLASTY lumbar four;  Surgeon: Melina Schools, MD;  Location: [facility];  Service: Orthopedics;  Laterality: N/A;   PARATHYROIDECTOMY  2010   ROBOTIC ASSISTED SALPINGO OOPHERECTOMY Bilateral 01/31/2018   Procedure: XI ROBOTIC ASSISTED BILATERAL SALPINGO OOPHORECTOMY, STAGING, LAPAROTOMY, PELVIC AND PARA AORTIC LYMPH NODE DISSECTION, OMENTECTOMY;  Surgeon: Isabel Caprice, MD;  Location: [facility];  Service: Gynecology;  Laterality: Bilateral;   ROTATOR CUFF REPAIR  2005   right    TOTAL KNEE ARTHROPLASTY Left 12/08/2014   Procedure: LEFT TOTAL KNEE ARTHROPLASTY;  Surgeon: Tobi Bastos, MD;  Location: [facility];  Service: Orthopedics;  Laterality: Left;   VAGINAL HYSTERECTOMY  1984    Family History  Problem Relation Age of Onset   Lymphoma Mother 29   Bladder Cancer Sister 71   Prostate cancer Brother 28       has surgery right away after dx   Leukemia Brother        55's   Breast cancer Other 16       niece, GT 2017 reportedly neg   Prostate cancer Father 70       found on autopsy at death (59)   Lung cancer Maternal Aunt    Esophageal cancer Maternal Uncle    Other Maternal Grandfather 56       cerebral hemmhorage   Lung cancer Cousin    Lung cancer Cousin    Leukemia Cousin    Allergic rhinitis Neg Hx    Angioedema Neg Hx    Asthma Neg  Hx    Atopy Neg Hx    Eczema Neg Hx    Immunodeficiency Neg Hx    Urticaria Neg Hx     Social History   Socioeconomic History   Marital status: Widowed    Spouse name: Not on file   Number of children: 2   Years of education: Not on file   Highest education level: Not on file  Occupational History   Occupation: retired Glass blower/designer  Tobacco Use   Smoking status: Never   Smokeless tobacco: Never  Vaping Use   Vaping Use: Never used  Substance and Sexual Activity   Alcohol use: No   Drug use: No   Sexual activity: Not on file  Other Topics Concern   Not on file  Social History Narrative   Not on file   Social Determinants of Health   Financial Resource Strain: Not on file  Food Insecurity: Not on file  Transportation Needs: Not on file  Physical Activity: Not on file  Stress: Not on file  Social Connections: Not on file    Current Medications:  Current Outpatient Medications:    acetaminophen (TYLENOL) 500 MG tablet, Take 1 tablet (500 mg total) by mouth every 6 (six) hours as needed for moderate pain., Disp: 100 tablet, Rfl: 2   ALPRAZolam (XANAX) 0.5 MG tablet, Take 0.5 mg by mouth at bedtime as needed for sleep., Disp: , Rfl:    amLODipine (NORVASC) 5 MG tablet, Take 5 mg by mouth daily., Disp: , Rfl:    Cyanocobalamin 1000 MCG/ML KIT, Inject 1,000 mcg as directed every 14 (fourteen) days., Disp: , Rfl:    famotidine (PEPCID) 40 MG tablet, Take by mouth., Disp: , Rfl:    FLUoxetine (PROZAC) 20 MG capsule, Take 20 mg by mouth daily after breakfast., Disp: , Rfl:    gabapentin (NEURONTIN) 300 MG capsule, Take 1 capsule (300 mg total) by mouth 2 (two) times daily., Disp: 60 capsule, Rfl: 2   glimepiride (AMARYL) 1 MG tablet, Take 1 mg by mouth daily as needed. If CBG is elevated, Disp: , Rfl:    hydrochlorothiazide (HYDRODIURIL) 25 MG tablet, Take 25 mg by mouth daily., Disp: , Rfl:    levothyroxine (SYNTHROID) 100 MCG tablet, Take 1 tablet by mouth daily., Disp:  , Rfl:    linaclotide (LINZESS) 145 MCG CAPS capsule, Take 145 mcg by mouth daily before breakfast., Disp: , Rfl:    losartan (COZAAR) 100 MG tablet, Take 100 mg by mouth at  bedtime. , Disp: , Rfl:    Melatonin 5 MG TABS, Take 5-10 mg by mouth at bedtime as needed (sleep). , Disp: , Rfl:    metFORMIN (GLUCOPHAGE-XR) 500 MG 24 hr tablet, Take 1,000 mg by mouth 2 (two) times daily. , Disp: , Rfl:    RUBRACA 300 MG tablet, TAKE 1 TABLET (300 MG TOTAL) BY MOUTH 2 (TWO) TIMES DAILY., Disp: 60 tablet, Rfl: 11   traMADol (ULTRAM) 50 MG tablet, tramadol 50 mg tablet  Take 1 tablet twice a day by oral route as needed for 10 days., Disp: , Rfl:   Review of Systems: Denies appetite changes, fevers, chills, fatigue, unexplained weight changes. Denies hearing loss, neck lumps or masses, mouth sores, ringing in ears or voice changes. Denies cough or wheezing.  Denies shortness of breath. Denies chest pain or palpitations. Denies leg swelling. Denies abdominal distention, pain, blood in stools, constipation, nausea, vomiting, or early satiety. Denies pain with intercourse, dysuria, frequency, hematuria or incontinence. Denies hot flashes, pelvic pain, vaginal bleeding or vaginal discharge.   Denies joint pain, back pain or muscle pain/cramps. Denies itching, rash, or wounds. Denies dizziness, headaches, numbness or seizures. Denies swollen lymph nodes or glands, denies easy bruising or bleeding. Denies anxiety, depression, confusion, or decreased concentration.  Physical Exam: BP 128/62 (BP Location: Left Arm, Patient Position: Sitting)   Pulse 66   Temp 97.8 F (36.6 C) (Oral)   Resp 16   Ht _0  (1.626 m)   Wt 161 lb 3.2 oz (73.1 kg)   SpO2 100%   BMI 27.67 kg/m  General: Alert, oriented, no acute distress. HEENT: Normocephalic, atraumatic, sclera anicteric.  No thyromegaly or masses appreciated. Chest: Clear to auscultation bilaterally.  Port site clean. Cardiovascular: Regular rate and  rhythm, no murmurs. Abdomen: soft, nontender.  Normoactive bowel sounds.  No masses or hepatosplenomegaly appreciated.  Well-healed incisions. Extremities: Grossly normal range of motion.  Warm, well perfused.  No edema bilaterally. Lymphatics: No cervical, supraclavicular, or inguinal adenopathy. GU: Normal appearing external genitalia without erythema, excoriation, or lesions.  Speculum exam reveals moderately atrophic vaginal mucosa, cuff intact, no lesions or masses.  Bimanual exam reveals no masses or nodularity.  Rectovaginal exam confirms findings.  Laboratory & Radiologic Studies: CBC    Component Value Date/Time   WBC 5.6 06/24/2021 1140   RBC 3.40 (L) 06/24/2021 1140   HGB 11.7 (L) 06/24/2021 1140   HGB 11.9 (L) 02/13/2019 1024   HCT 34.0 (L) 06/24/2021 1140   PLT 167 06/24/2021 1140   PLT 190 02/13/2019 1024   MCV 100.0 06/24/2021 1140   MCH 34.4 (H) 06/24/2021 1140   MCHC 34.4 06/24/2021 1140   RDW 13.4 06/24/2021 1140   LYMPHSABS 1.8 06/24/2021 1140   MONOABS 0.3 06/24/2021 1140   EOSABS 0.1 06/24/2021 1140   BASOSABS 0.1 06/24/2021 1140   Assessment & Plan: [name] is a 80 y.o. 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  Patient is overall doing very well and is NED.  She had an eventful summer after fracturing her back but has healed remarkably well from this.  We reviewed signs and symptoms that would be concerning for disease recurrence.  She will continue to follow with [name].  I will plan to see her back in 6 months unless she develops symptoms before  then.  I have asked her to call after the beginning of the year to schedule an appointment in March.  We reviewed her CBC, which showed stable mild anemia.  CMP and CA-125 were not back yet.  She reports occasionally feeling a  small lump in her neck, similar to what she felt prior to her parathyroidectomy.  I do not appreciate anything on exam today.  I have encouraged her to reach out to her primary care provider regarding possibility of a neck ultrasound.  In terms of her diarrhea, I think she likely needs to come down on her dose of Linzess.  She has some samples of the lower lower dose.  I think it would be very reasonable for her to try this lower dose to see if it helps with her diarrhea and fecal urgency.  32 minutes of total time was spent for this patient encounter, including preparation, face-to-face counseling with the patient and coordination of care, and documentation of the encounter.  Jeral Pinch, MD  Division of Gynecologic Oncology  Department of Obstetrics and Gynecology  [facility]

## 2021-06-24 ENCOUNTER — Other Ambulatory Visit: Payer: Self-pay

## 2021-06-24 ENCOUNTER — Inpatient Hospital Stay: Payer: Medicare HMO | Attending: Hematology and Oncology | Admitting: Gynecologic Oncology

## 2021-06-24 ENCOUNTER — Inpatient Hospital Stay: Payer: Medicare HMO

## 2021-06-24 ENCOUNTER — Encounter: Payer: Self-pay | Admitting: Gynecologic Oncology

## 2021-06-24 VITALS — BP 128/62 | HR 66 | Temp 97.8°F | Resp 16 | Ht 64.0 in | Wt 161.2 lb

## 2021-06-24 DIAGNOSIS — Z79899 Other long term (current) drug therapy: Secondary | ICD-10-CM | POA: Diagnosis not present

## 2021-06-24 DIAGNOSIS — C562 Malignant neoplasm of left ovary: Secondary | ICD-10-CM | POA: Diagnosis not present

## 2021-06-24 DIAGNOSIS — K5900 Constipation, unspecified: Secondary | ICD-10-CM | POA: Insufficient documentation

## 2021-06-24 DIAGNOSIS — Z9221 Personal history of antineoplastic chemotherapy: Secondary | ICD-10-CM | POA: Insufficient documentation

## 2021-06-24 DIAGNOSIS — D649 Anemia, unspecified: Secondary | ICD-10-CM | POA: Diagnosis not present

## 2021-06-24 DIAGNOSIS — C786 Secondary malignant neoplasm of retroperitoneum and peritoneum: Secondary | ICD-10-CM | POA: Diagnosis not present

## 2021-06-24 DIAGNOSIS — Z90722 Acquired absence of ovaries, bilateral: Secondary | ICD-10-CM | POA: Diagnosis not present

## 2021-06-24 LAB — CBC WITH DIFFERENTIAL/PLATELET
Abs Immature Granulocytes: 0.03 10*3/uL (ref 0.00–0.07)
Basophils Absolute: 0.1 10*3/uL (ref 0.0–0.1)
Basophils Relative: 1 %
Eosinophils Absolute: 0.1 10*3/uL (ref 0.0–0.5)
Eosinophils Relative: 2 %
HCT: 34 % — ABNORMAL LOW (ref 36.0–46.0)
Hemoglobin: 11.7 g/dL — ABNORMAL LOW (ref 12.0–15.0)
Immature Granulocytes: 1 %
Lymphocytes Relative: 33 %
Lymphs Abs: 1.8 10*3/uL (ref 0.7–4.0)
MCH: 34.4 pg — ABNORMAL HIGH (ref 26.0–34.0)
MCHC: 34.4 g/dL (ref 30.0–36.0)
MCV: 100 fL (ref 80.0–100.0)
Monocytes Absolute: 0.3 10*3/uL (ref 0.1–1.0)
Monocytes Relative: 6 %
Neutro Abs: 3.2 10*3/uL (ref 1.7–7.7)
Neutrophils Relative %: 57 %
Platelets: 167 10*3/uL (ref 150–400)
RBC: 3.4 MIL/uL — ABNORMAL LOW (ref 3.87–5.11)
RDW: 13.4 % (ref 11.5–15.5)
WBC: 5.6 10*3/uL (ref 4.0–10.5)
nRBC: 0 % (ref 0.0–0.2)

## 2021-06-24 LAB — COMPREHENSIVE METABOLIC PANEL
ALT: 18 U/L (ref 0–44)
AST: 27 U/L (ref 15–41)
Albumin: 3.8 g/dL (ref 3.5–5.0)
Alkaline Phosphatase: 61 U/L (ref 38–126)
Anion gap: 10 (ref 5–15)
BUN: 19 mg/dL (ref 8–23)
CO2: 24 mmol/L (ref 22–32)
Calcium: 9.8 mg/dL (ref 8.9–10.3)
Chloride: 105 mmol/L (ref 98–111)
Creatinine, Ser: 0.97 mg/dL (ref 0.44–1.00)
GFR, Estimated: 59 mL/min — ABNORMAL LOW (ref >60)
Glucose, Bld: 98 mg/dL (ref 70–99)
Potassium: 4.5 mmol/L (ref 3.5–5.1)
Sodium: 139 mmol/L (ref 135–145)
Total Bilirubin: 0.6 mg/dL (ref 0.3–1.2)
Total Protein: 6.4 g/dL — ABNORMAL LOW (ref 6.5–8.1)

## 2021-06-24 MED ORDER — HEPARIN SOD (PORK) LOCK FLUSH 100 UNIT/ML IV SOLN
500.0000 [IU] | Freq: Once | INTRAVENOUS | Status: AC
  Administered 2021-06-24: 500 [IU]

## 2021-06-24 MED ORDER — SODIUM CHLORIDE 0.9% FLUSH
10.0000 mL | Freq: Once | INTRAVENOUS | Status: AC
  Administered 2021-06-24: 10 mL

## 2021-06-24 NOTE — Patient Instructions (Signed)
It was great to meet you!  I do not see or feel anything on your exam concerning for cancer recurrence.  Your CA-125 will release in my chart tomorrow.  We reviewed your CBC, your electrolytes should be back later today.  If you develop any signs or symptoms that are concerning, such as pelvic pain, weight loss that is unintentional, change in your bowel function, please call to see me sooner.  Otherwise, please call after the new year to schedule a visit to see me in March 2023.

## 2021-06-25 LAB — CA 125: Cancer Antigen (CA) 125: 12.9 U/mL (ref 0.0–38.1)

## 2021-06-27 ENCOUNTER — Telehealth: Payer: Self-pay

## 2021-06-27 NOTE — Telephone Encounter (Signed)
-----   Message from Heath Lark, MD sent at 06/27/2021  7:40 AM EDT ----- Can you let her know CA-125 is stable?

## 2021-06-27 NOTE — Telephone Encounter (Signed)
Called and given below message. She verbalized understanding. 

## 2021-07-07 ENCOUNTER — Other Ambulatory Visit (HOSPITAL_COMMUNITY): Payer: Self-pay

## 2021-07-13 ENCOUNTER — Other Ambulatory Visit (HOSPITAL_COMMUNITY): Payer: Self-pay

## 2021-08-03 ENCOUNTER — Telehealth: Payer: Self-pay | Admitting: Oncology

## 2021-08-03 DIAGNOSIS — E039 Hypothyroidism, unspecified: Secondary | ICD-10-CM

## 2021-08-03 NOTE — Telephone Encounter (Signed)
[name] called and said she got the flu vaccine 2 weeks ago and is wondering if [name] is recommending getting the Covid Booster.  She had her last booster in January.  She also has been feeling tired so she asked if [name] could check her tyroid function when she gets labs next week.  She doesn't see her primary care doctor until December.

## 2021-08-03 NOTE — Telephone Encounter (Signed)
Husna was notified of message below from [name].

## 2021-08-03 NOTE — Telephone Encounter (Signed)
I can discuss the vaccine with her next week I am ok checking her thyroid next week, pls put in verbal order

## 2021-08-08 ENCOUNTER — Other Ambulatory Visit (HOSPITAL_COMMUNITY): Payer: Self-pay

## 2021-08-11 ENCOUNTER — Other Ambulatory Visit (HOSPITAL_COMMUNITY): Payer: Self-pay

## 2021-08-11 ENCOUNTER — Other Ambulatory Visit: Payer: Self-pay

## 2021-08-11 ENCOUNTER — Inpatient Hospital Stay: Payer: Medicare HMO

## 2021-08-11 ENCOUNTER — Inpatient Hospital Stay: Payer: Medicare HMO | Attending: Hematology and Oncology | Admitting: Hematology and Oncology

## 2021-08-11 ENCOUNTER — Encounter: Payer: Self-pay | Admitting: Hematology and Oncology

## 2021-08-11 DIAGNOSIS — G8929 Other chronic pain: Secondary | ICD-10-CM | POA: Diagnosis not present

## 2021-08-11 DIAGNOSIS — E039 Hypothyroidism, unspecified: Secondary | ICD-10-CM | POA: Diagnosis not present

## 2021-08-11 DIAGNOSIS — C562 Malignant neoplasm of left ovary: Secondary | ICD-10-CM | POA: Diagnosis not present

## 2021-08-11 DIAGNOSIS — G620 Drug-induced polyneuropathy: Secondary | ICD-10-CM | POA: Diagnosis not present

## 2021-08-11 DIAGNOSIS — M549 Dorsalgia, unspecified: Secondary | ICD-10-CM | POA: Diagnosis not present

## 2021-08-11 DIAGNOSIS — T451X5A Adverse effect of antineoplastic and immunosuppressive drugs, initial encounter: Secondary | ICD-10-CM

## 2021-08-11 LAB — COMPREHENSIVE METABOLIC PANEL
ALT: 24 U/L (ref 0–44)
AST: 34 U/L (ref 15–41)
Albumin: 3.8 g/dL (ref 3.5–5.0)
Alkaline Phosphatase: 61 U/L (ref 38–126)
Anion gap: 11 (ref 5–15)
BUN: 21 mg/dL (ref 8–23)
CO2: 22 mmol/L (ref 22–32)
Calcium: 9.5 mg/dL (ref 8.9–10.3)
Chloride: 106 mmol/L (ref 98–111)
Creatinine, Ser: 0.98 mg/dL (ref 0.44–1.00)
GFR, Estimated: 58 mL/min — ABNORMAL LOW (ref >60)
Glucose, Bld: 109 mg/dL — ABNORMAL HIGH (ref 70–99)
Potassium: 4.3 mmol/L (ref 3.5–5.1)
Sodium: 139 mmol/L (ref 135–145)
Total Bilirubin: 1.1 mg/dL (ref 0.3–1.2)
Total Protein: 6.5 g/dL (ref 6.5–8.1)

## 2021-08-11 LAB — CBC WITH DIFFERENTIAL/PLATELET
Abs Immature Granulocytes: 0.04 10*3/uL (ref 0.00–0.07)
Basophils Absolute: 0 10*3/uL (ref 0.0–0.1)
Basophils Relative: 1 %
Eosinophils Absolute: 0.1 10*3/uL (ref 0.0–0.5)
Eosinophils Relative: 1 %
HCT: 36 % (ref 36.0–46.0)
Hemoglobin: 12 g/dL (ref 12.0–15.0)
Immature Granulocytes: 1 %
Lymphocytes Relative: 29 %
Lymphs Abs: 1.9 10*3/uL (ref 0.7–4.0)
MCH: 33.2 pg (ref 26.0–34.0)
MCHC: 33.3 g/dL (ref 30.0–36.0)
MCV: 99.7 fL (ref 80.0–100.0)
Monocytes Absolute: 0.4 10*3/uL (ref 0.1–1.0)
Monocytes Relative: 6 %
Neutro Abs: 4.2 10*3/uL (ref 1.7–7.7)
Neutrophils Relative %: 62 %
Platelets: 184 10*3/uL (ref 150–400)
RBC: 3.61 MIL/uL — ABNORMAL LOW (ref 3.87–5.11)
RDW: 13.9 % (ref 11.5–15.5)
WBC: 6.7 10*3/uL (ref 4.0–10.5)
nRBC: 0 % (ref 0.0–0.2)

## 2021-08-11 LAB — TSH: TSH: 1.87 u[IU]/mL (ref 0.308–3.960)

## 2021-08-11 MED ORDER — SODIUM CHLORIDE 0.9% FLUSH
10.0000 mL | Freq: Once | INTRAVENOUS | Status: AC
  Administered 2021-08-11: 10 mL

## 2021-08-11 MED ORDER — CALCIUM CARBONATE ANTACID 500 MG PO CHEW: 1.0000 | CHEWABLE_TABLET | Freq: Every day | ORAL | 0 refills | Status: DC

## 2021-08-11 MED ORDER — LIDOCAINE-PRILOCAINE 2.5-2.5 % EX CREA: 1.0000 "application " | TOPICAL_CREAM | CUTANEOUS | 9 refills | Status: DC | PRN

## 2021-08-11 MED ORDER — HEPARIN SOD (PORK) LOCK FLUSH 100 UNIT/ML IV SOLN
500.0000 [IU] | Freq: Once | INTRAVENOUS | Status: AC
  Administered 2021-08-11: 500 [IU]

## 2021-08-11 NOTE — Assessment & Plan Note (Signed)
She had persistent neuropathy but does not like taking gabapentin I have removed that from her medication list Observe only

## 2021-08-11 NOTE — Progress Notes (Signed)
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[place]) Her examination is benign She is not symptomatic We will continue treatment as scheduled She will return in 8 weeks with blood draw, port flush and further follow-up She does not need regular surveillance imaging study in the absence of symptoms  Chronic back pain greater than 3 months duration She has history of chronic back pain and was told she has osteoporosis by her doctor I recommend the patient's start by taking calcium and vitamin D supplement  Acquired hypothyroidism She has history of acquired hypothyroidism and previously required multiple dose adjustments I have ordered her thyroid function panel today and we will call her with test results tomorrow  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   All questions were answered. The patient knows to call the clinic with any problems, questions or concerns. The total time spent in the appointment was 20 minutes encounter with patients including review of chart and various tests results, discussions about plan of care and coordination of care plan   Heath Lark, MD 08/11/2021 1:27 PM  INTERVAL HISTORY: Please see below for problem oriented charting. she returns for treatment follow-up on rucaparib for ovarian cancer She is doing very well No new side effects No recent infection She has chronic peripheral neuropathy, stable Denies recent changes in bowel habits  REVIEW OF SYSTEMS:   Constitutional: Denies fevers, chills or abnormal weight loss Eyes: Denies blurriness of vision Ears, nose, mouth, throat, and face: Denies mucositis or sore  throat Respiratory: Denies cough, dyspnea or wheezes Cardiovascular: Denies palpitation, chest discomfort or lower extremity swelling Gastrointestinal:  Denies nausea, heartburn or change in bowel habits Skin: Denies abnormal skin rashes Lymphatics: Denies new lymphadenopathy or easy bruising Neurological:Denies numbness, tingling or new weaknesses Behavioral/Psych: Mood is stable, no new changes  All other systems were reviewed with the patient and are negative.  I have reviewed the past medical history, past surgical history, social history and family history with the patient and they are unchanged from previous note.  ALLERGIES:  is allergic to penicillins and rosuvastatin.  MEDICATIONS:  Current Outpatient Medications  Medication Sig Dispense Refill   calcium carbonate (TUMS) 500 MG chewable tablet Chew 1 tablet (200 mg of elemental calcium total) by mouth daily. 30 tablet 0   cholecalciferol (VITAMIN D3) 25 MCG (1000 UNIT) tablet Take 2,000 Units by mouth daily.     lidocaine-prilocaine (EMLA) cream Apply 1 application topically as needed. 30 g 9   acetaminophen (TYLENOL) 500 MG tablet Take 1 tablet (500 mg total) by mouth every 6 (six) hours as needed for moderate pain. 100 tablet 2   ALPRAZolam (XANAX) 0.5 MG tablet Take 0.5 mg by mouth at bedtime as needed for sleep.     amLODipine (NORVASC) 5 MG tablet Take 5 mg by mouth daily.     Cyanocobalamin 1000 MCG/ML KIT Inject 1,000 mcg as directed every 14 (fourteen) days.     famotidine (PEPCID) 40 MG tablet Take by mouth.     FLUoxetine (PROZAC) 20 MG capsule Take 20 mg by mouth daily after breakfast.     gabapentin (NEURONTIN) 300 MG capsule Take 1 capsule (300 mg total) by  mouth 2 (two) times daily. 60 capsule 2   glimepiride (AMARYL) 1 MG tablet Take 1 mg by mouth daily as needed. If CBG is elevated     hydrochlorothiazide (HYDRODIURIL) 25 MG tablet Take 25 mg by mouth daily.     levothyroxine (SYNTHROID) 100 MCG tablet Take 1  tablet by mouth daily.     linaclotide (LINZESS) 145 MCG CAPS capsule Take 145 mcg by mouth daily before breakfast.     losartan (COZAAR) 100 MG tablet Take 100 mg by mouth at bedtime.      Melatonin 5 MG TABS Take 5-10 mg by mouth at bedtime as needed (sleep).      metFORMIN (GLUCOPHAGE-XR) 500 MG 24 hr tablet Take 1,000 mg by mouth 2 (two) times daily.      RUBRACA 300 MG tablet TAKE 1 TABLET (300 MG TOTAL) BY MOUTH 2 (TWO) TIMES DAILY. 60 tablet 11   traMADol (ULTRAM) 50 MG tablet tramadol 50 mg tablet  Take 1 tablet twice a day by oral route as needed for 10 days.     No current facility-administered medications for this visit.    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 - TUMOR INVOLVES OVARY SURFACE AND LEFT FALLOPIAN TUBE. - SEE ONCOLOGY TABLE. 2. Mesentery, small bowel mesentery biopsy #1 - BENIGN FIBROADIPOSE TISSUE. 3. Ovary and fallopian tube, right - BENIGN OVARY WITH INCLUSION CYSTS. - BENIGN FALLOPIAN TUBE WITH PARATUBAL CYSTS AND ADENOFIBROMA. 4. Omentum, resection for tumor - BENIGN ADIPOSE TISSUE. 5. Peritoneum, biopsy, left diaphragmatic - BENIGN PERITONEAL TYPE TISSUE. 6. Peritoneum, biopsy, right diaphragmatic - BENIGN PERITONEAL TYPE TISSUE. 7. Mesentery, small bowel mesenteric biopsy #2 - BENIGN FIBROADIPOSE TISSUE. 8. Lymph nodes, regional resection, right pelvic - FIVE OF FIVE LYMPH NODES NEGATIVE FOR CARCINOMA (0/5). 9. Lymph nodes, regional resection, left pelvic - FOUR OF FOUR LYMPH NODES NEGATIVE FOR CARCINOMA (0/4). 10. Peritoneum, biopsy, left gutter - BENIGN PERITONEAL  TYPE TISSUE. 11. Peritoneum, biopsy, bladder - BENIGN PERITONEAL TYPE TISSUE WITH ACUTE INFLAMMATION AND CALCIFICATIONS.  12. Peritoneum, biopsy, cul-de-sac - BENIGN PERITONEAL TYPE TISSUE WITH ACUTE INFLAMMATION AND CALCIFICATIONS. 13. Soft tissue, biopsy, right gutter - BENIGN FIBROADIPOSE TISSUE. 14. Lymph node, biopsy, right para-aortic - TWO OF TWO LYMPH NODES NEGATIVE FOR CARCINOMA (0/2). 15. Lymph node, biopsy, left para-aortic - ONE OF ONE LYMPH NODES NEGATIVE FOR CARCINOMA (0/1). Microscopic Comment 1. OVARY Specimen(s): Left ovary and fallopian tube. Procedure: (including lymph node sampling): Bilateral salpingo-oophorectomy with omental and peritoneal biopsies and lymph node biopsies. Primary tumor site (including laterality): Left ovary. Ovarian surface involvement: Present. Ovarian capsule intact without fragmentation: Intact. Maximum tumor size (cm): 11 cm total. Histologic type: Serous cystadenocarcinoma. Grade: High grade (low grade areas also present). Peritoneal implants: (specify invasive or non-invasive): N/A. Pelvic extension (list additional structures on separate lines and if involved): Left fallopian tube. Lymph nodes: number examined 12 ; number positive 0 TNM code: pT2a, pN0, pMX FIGO Stage (based on pathologic findings, needs clinical correlation): IIA Comments: None.   01/31/2018 Pathology Results   PERITONEAL WASHING (SPECIMEN 1 OF 1 COLLECTED 01/31/18): MALIGNANT CELLS PRESENT CONSISTENT WITH METASTATIC CARCINOMA.  01/31/2018 Surgery   Procedure(s) Performed:  1. Robotic BSO and washings. 2. Exploratory laparotomy, Staging including infragastric omentectomy, Bilateral pelvic and paraaortic lymphadenectomy, pertioneal biopsies.    Specimens: Bilateral tubes / ovaries, bilateral pelvic and paraaortic lymph nodes, peritoneal biopsies, washings and omentum.  Operative Findings: The left adnexa was adherent to the left pelvic peritoneum suspected due to  the inferior retraction from the vaginal hysterectomy. Intraoperative leakage of cystic fluid from left ovary.  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  The right adnexa grossly appeared normal although slightly enlarged for her age.  There were some indurated lymph nodes but no overtly malignant lymph nodes were suspected.  Small bowel mesentery had 3-4 superficial possible implants.  1 of these was sent for frozen section returned benign.  She did have adhesive disease from her open cholecystectomy in the right upper quadrant.  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 16 mm exophytic lesion upper pole right kidney has attenuation too high to be a simple cyst. This may be a cyst complicated by proteinaceous debris or hemorrhage, but renal cell carcinoma is a concern. Routine outpatient follow-up MRI of the abdomen without and with contrast recommended after resolution of patient's acute symptoms (so she is better able to participate with positioning and breath holding). 2. Mild edema/possible minimal hemorrhage in the extraperitoneal soft tissues of the pelvic for tracking up both pelvic sidewalls. Imaging appearance is not outside of the spectrum of findings expected 6 days after the reported surgery. Trace interloop mesenteric and free fluid is identified in the peritoneal cavity in there is some mild edema in the omentum. There is no organized or rim enhancing collection to suggest evolving abscess. 3. Gas in the extraperitoneal soft tissues of the left  lower quadrant is associated with gas in the subcutaneous fat of the lower left anterior abdominal wall. This is not unexpected on postoperative day 6. No evidence for intraperitoneal free air. 4. No CT features to suggest small bowel obstruction. Oral contrast material has migrated about halfway through the small bowel loops but there is no differential distention of proximal versus distal small bowel. The colon is diffusely distended and fluid-filled proximally, but formed stool is noted in the distal colon. Given apparent slow migration of contrast and fluid-filled small and large bowel loops, a component of ileus is possible.   02/14/2018 Imaging   MR abdomen Benign Bosniak category 2 cyst in upper pole of right kidney, corresponding with lesion seen on previous CT. No evidence of renal neoplasm or other significant abnormality.     02/18/2018 Cancer Staging   Staging form: Ovary, Fallopian Tube, and Primary Peritoneal Carcinoma, AJCC 8th Edition - Pathologic: Stage II (pT2, pN0, cM0) - Signed by Heath Lark, MD on 02/18/2018    02/28/2018 Procedure   Status post right IJ port catheter placement. Catheter ready for use   03/01/2018 Tumor Marker   Patient's tumor was tested for the following markers: CA-125 Results of the tumor marker test revealed 13.6   03/04/2018 - 06/18/2018 Chemotherapy   The patient  had carboplatin and Taxol x 6 cycles with dose reduction due to neuropathy. For last cycle, taxol was omitted   07/18/2018 Imaging   1. No evidence for residual or recurrent tumor within the abdomen or pelvis. There has been interval resolution edema and fluid within the peritoneal cavity. 2. New pulmonary nodule is identified within the right lower lobe measuring 1.1 cm, image 87/4. Consider one of the following in 3 months for both low-risk and high-risk individuals: (a) repeat chest CT, (b) follow-up PET-CT, or (c) tissue sampling. This recommendation follows the consensus statement: Guidelines  for Management of Incidental Pulmonary Nodules Detected on CT Images: From the Fleischner Society 2017; Radiology 2017; 284:228-243. 3. Stable 7 mm nodule within the right upper lobe. 4.  Aortic Atherosclerosis (ICD10-I70.0). 5. Multi vessel coronary artery atherosclerotic calcifications.   07/18/2018 Tumor Marker   Patient's tumor was tested for the following markers: CA-125 Results of the tumor marker test revealed 14.9   07/25/2018 PET scan   1. Regressing right lower lobe peripheral lung lesion, likely resolving inflammatory or atelectatic process. No hypermetabolism to suggest malignancy. I would recommend a follow-up noncontrast chest CT and 4-6 months. Small upper lobe pulmonary nodules are stable. 2. 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   07/31/2018 -  Chemotherapy   The patient started taking rucaparib   10/25/2018 Tumor Marker   Patient's tumor was tested for the following markers: CA-125 Results of the tumor marker test revealed 12.1   11/25/2018 Imaging   1. Right lower lobe nodule has resolved. Additional right lung nodules are stable. 2. Stable hazy nodularity along the left external iliac chain.  3. Hepatomegaly. 4. Aortic atherosclerosis (ICD10-170.0). Coronary artery calcification. 5. Enlarged pulmonic trunk, indicative of pulmonary arterial hypertension.   01/16/2019 Tumor Marker   Patient's tumor was tested for the following markers: CA-125 Results of the tumor marker test revealed 13.1   05/22/2019 Tumor Marker   Patient's tumor was tested for the following markers: CA-125 Results of the tumor marker test revealed 14.4   06/24/2019 Imaging   Ct chest, abdomen and pelvis 1. There are multiple clustered tiny centrilobular nodules and irregular opacities of the bilateral lower lobes and lingula, increased compared to prior examination, with underlying bandlike scarring  of the medial right middle lobe and right lung base (e.g. series 4, image 100, 110, 123). 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  2. Stable 6 mm pulmonary nodule of the right upper lobe (series 4, image 45). Attention on follow-up.   3. Status post hysterectomy and oophorectomy. No evidence of recurrent or metastatic disease in the abdomen or pelvis.   4. There are occasional sigmoid diverticula and focal fat stranding about a diverticulum of the mid sigmoid (series 2, image 103). Correlate for signs and symptoms of acute diverticulitis.   5. Other chronic, incidental, and postoperative findings as detailed above.   06/24/2019 Tumor Marker   Patient's tumor was tested for the following markers: CA-125 Results of the tumor marker test revealed 14.4.   08/19/2019 Tumor Marker   Patient's tumor was tested for the following markers: CA-125 Results of the tumor marker test revealed 10.3   09/25/2019 Imaging   CT chest 1. Interval resolution of previously demonstrated clustered centrilobular nodularity at both lung bases consistent with resolved inflammation. Stable 6 mm right upper lobe nodule consistent with a benign finding. No new or enlarging pulmonary nodules.  2. Stable chronic bronchiectasis and scarring in the right middle lobe and lingula. 3. No evidence of metastatic disease. 4. Aortic Atherosclerosis (ICD10-I70.0).   10/30/2019 Tumor Marker   Patient's tumor was tested for the following markers: CA-125 Results of the tumor marker test revealed 12.1   12/25/2019 Tumor Marker   Patient's tumor was tested for the following markers: CA-125. Results of the tumor marker test revealed 14.8.   02/19/2020 Tumor Marker   Patient's tumor was tested for the following markers: CA-125. Results of the tumor marker test revealed 15.5   04/22/2020 Tumor Marker   Patient's tumor was tested for the following markers: CA-125 Results of the tumor  marker test revealed 8.9   06/16/2020 Tumor Marker   Patient's tumor was tested for the following markers: CA-125 Results of the tumor marker test revealed 12.5   06/23/2020 Imaging   1. Stable exam. No new or progressive findings to suggest recurrent or metastatic disease. 2. Aortic Atherosclerosis (ICD10-I70.0).   08/19/2020 Tumor Marker   Patient's tumor was tested for the following markers: CA-125 Results of the tumor marker test revealed 19.1   09/21/2020 Tumor Marker   Patient's tumor was tested for the following markers: CA-125 Results of the tumor marker test revealed 12.7.   11/17/2020 Tumor Marker   Patient's tumor was tested for the following markers: CA-125 Results of the tumor marker test revealed 14.7   01/11/2021 Tumor Marker   Patient's tumor was tested for the following markers: CA-125 Results of the tumor marker test revealed 11.6   02/24/2021 Tumor Marker   Patient's tumor was tested for the following markers: CA125. Results of the tumor marker test revealed 11.8   04/21/2021 Tumor Marker   Patient's tumor was tested for the following markers: CA-125. Results of the tumor marker test revealed 10.9.   06/24/2021 Tumor Marker   Patient's tumor was tested for the following markers: CA-125. Results of the tumor marker test revealed 12.9.     PHYSICAL EXAMINATION: ECOG PERFORMANCE STATUS: 1 - Symptomatic but completely ambulatory  Vitals:   08/11/21 1258  BP: 129/60  Pulse: 70  Resp: 18  Temp: 98.6 F (37 C)  SpO2: 100%   Filed Weights   08/11/21 1258  Weight: 161 lb 12.8 oz (73.4 kg)    GENERAL:alert, no distress and comfortable SKIN: skin color, texture, turgor are normal, no rashes or significant lesions EYES: normal, Conjunctiva are pink and non-injected, sclera clear OROPHARYNX:no exudate, no erythema and lips, buccal mucosa, and tongue normal  NECK: supple, thyroid normal size, non-tender, without nodularity LYMPH:  no palpable lymphadenopathy in  the cervical, axillary or inguinal LUNGS: clear to auscultation and percussion with normal breathing effort HEART: regular rate & rhythm and no murmurs and no lower extremity edema ABDOMEN:abdomen soft, non-tender and normal bowel sounds.  Noted well-healed surgical scar Musculoskeletal:no cyanosis of digits and no clubbing  NEURO: alert & oriented x 3 with fluent speech, no focal motor/sensory deficits  LABORATORY DATA:  I have reviewed the data as listed    Component Value Date/Time   NA 139 08/11/2021 1219   K 4.3 08/11/2021 1219   CL 106 08/11/2021 1219   CO2 22 08/11/2021 1219   GLUCOSE 109 (H) 08/11/2021 1219   BUN 21 08/11/2021 1219   CREATININE 0.98 08/11/2021 1219   CREATININE 0.93 02/13/2019 1024   CALCIUM 9.5 08/11/2021 1219   PROT 6.5 08/11/2021 1219   ALBUMIN 3.8 08/11/2021 1219   AST 34 08/11/2021 1219  AST 26 02/13/2019 1024   ALT 24 08/11/2021 1219   ALT 23 02/13/2019 1024   ALKPHOS 61 08/11/2021 1219   BILITOT 1.1 08/11/2021 1219   BILITOT 0.8 02/13/2019 1024   GFRNONAA 58 (L) 08/11/2021 1219   GFRNONAA 59 (L) 02/13/2019 1024   GFRAA >60 06/16/2020 0918   GFRAA >60 02/13/2019 1024    No results found for: SPEP, UPEP  Lab Results  Component Value Date   WBC 6.7 08/11/2021   NEUTROABS 4.2 08/11/2021   HGB 12.0 08/11/2021   HCT 36.0 08/11/2021   MCV 99.7 08/11/2021   PLT 184 08/11/2021      Chemistry      Component Value Date/Time   NA 139 08/11/2021 1219   K 4.3 08/11/2021 1219   CL 106 08/11/2021 1219   CO2 22 08/11/2021 1219   BUN 21 08/11/2021 1219   CREATININE 0.98 08/11/2021 1219   CREATININE 0.93 02/13/2019 1024      Component Value Date/Time   CALCIUM 9.5 08/11/2021 1219   ALKPHOS 61 08/11/2021 1219   AST 34 08/11/2021 1219   AST 26 02/13/2019 1024   ALT 24 08/11/2021 1219   ALT 23 02/13/2019 1024   BILITOT 1.1 08/11/2021 1219   BILITOT 0.8 02/13/2019 1024

## 2021-08-11 NOTE — Assessment & Plan Note (Signed)
She has history of acquired hypothyroidism and previously required multiple dose adjustments I have ordered her thyroid function panel today and we will call her with test results tomorrow

## 2021-08-11 NOTE — Assessment & Plan Note (Signed)
Her examination is benign She is not symptomatic We will continue treatment as scheduled She will return in 8 weeks with blood draw, port flush and further follow-up She does not need regular surveillance imaging study in the absence of symptoms

## 2021-08-11 NOTE — Assessment & Plan Note (Signed)
She has history of chronic back pain and was told she has osteoporosis by her doctor I recommend the patient's start by taking calcium and vitamin D supplement

## 2021-08-12 ENCOUNTER — Telehealth: Payer: Self-pay

## 2021-08-12 LAB — CA 125: Cancer Antigen (CA) 125: 12.6 U/mL (ref 0.0–38.1)

## 2021-08-12 NOTE — Telephone Encounter (Signed)
Called and given below message. She verbalized understanding. 

## 2021-08-12 NOTE — Telephone Encounter (Signed)
-----   Message from Heath Lark, MD sent at 08/12/2021  8:40 AM EDT ----- Pls call [name], TSH is normal and CA-125 is at baseline

## 2021-09-05 ENCOUNTER — Other Ambulatory Visit (HOSPITAL_COMMUNITY): Payer: Self-pay

## 2021-09-12 ENCOUNTER — Other Ambulatory Visit (HOSPITAL_COMMUNITY): Payer: Self-pay

## 2021-10-04 ENCOUNTER — Encounter: Payer: Self-pay | Admitting: Hematology and Oncology

## 2021-10-04 ENCOUNTER — Inpatient Hospital Stay: Payer: Medicare HMO | Attending: Hematology and Oncology

## 2021-10-04 ENCOUNTER — Other Ambulatory Visit: Payer: Self-pay

## 2021-10-04 ENCOUNTER — Inpatient Hospital Stay: Payer: Medicare HMO | Admitting: Hematology and Oncology

## 2021-10-04 DIAGNOSIS — D638 Anemia in other chronic diseases classified elsewhere: Secondary | ICD-10-CM | POA: Diagnosis not present

## 2021-10-04 DIAGNOSIS — C562 Malignant neoplasm of left ovary: Secondary | ICD-10-CM | POA: Insufficient documentation

## 2021-10-04 DIAGNOSIS — G620 Drug-induced polyneuropathy: Secondary | ICD-10-CM | POA: Diagnosis not present

## 2021-10-04 DIAGNOSIS — D6481 Anemia due to antineoplastic chemotherapy: Secondary | ICD-10-CM | POA: Diagnosis not present

## 2021-10-04 DIAGNOSIS — T451X5A Adverse effect of antineoplastic and immunosuppressive drugs, initial encounter: Secondary | ICD-10-CM | POA: Diagnosis not present

## 2021-10-04 LAB — CBC WITH DIFFERENTIAL/PLATELET
Abs Immature Granulocytes: 0.02 10*3/uL (ref 0.00–0.07)
Basophils Absolute: 0.1 10*3/uL (ref 0.0–0.1)
Basophils Relative: 1 %
Eosinophils Absolute: 0.1 10*3/uL (ref 0.0–0.5)
Eosinophils Relative: 2 %
HCT: 35.2 % — ABNORMAL LOW (ref 36.0–46.0)
Hemoglobin: 11.7 g/dL — ABNORMAL LOW (ref 12.0–15.0)
Immature Granulocytes: 0 %
Lymphocytes Relative: 35 %
Lymphs Abs: 2 10*3/uL (ref 0.7–4.0)
MCH: 33.1 pg (ref 26.0–34.0)
MCHC: 33.2 g/dL (ref 30.0–36.0)
MCV: 99.7 fL (ref 80.0–100.0)
Monocytes Absolute: 0.4 10*3/uL (ref 0.1–1.0)
Monocytes Relative: 7 %
Neutro Abs: 3 10*3/uL (ref 1.7–7.7)
Neutrophils Relative %: 55 %
Platelets: 167 10*3/uL (ref 150–400)
RBC: 3.53 MIL/uL — ABNORMAL LOW (ref 3.87–5.11)
RDW: 14.2 % (ref 11.5–15.5)
WBC: 5.6 10*3/uL (ref 4.0–10.5)
nRBC: 0 % (ref 0.0–0.2)

## 2021-10-04 LAB — COMPREHENSIVE METABOLIC PANEL
ALT: 18 U/L (ref 0–44)
AST: 24 U/L (ref 15–41)
Albumin: 4 g/dL (ref 3.5–5.0)
Alkaline Phosphatase: 51 U/L (ref 38–126)
Anion gap: 6 (ref 5–15)
BUN: 20 mg/dL (ref 8–23)
CO2: 26 mmol/L (ref 22–32)
Calcium: 9.2 mg/dL (ref 8.9–10.3)
Chloride: 107 mmol/L (ref 98–111)
Creatinine, Ser: 0.86 mg/dL (ref 0.44–1.00)
GFR, Estimated: >60 mL/min (ref >60)
Glucose, Bld: 102 mg/dL — ABNORMAL HIGH (ref 70–99)
Potassium: 4.3 mmol/L (ref 3.5–5.1)
Sodium: 139 mmol/L (ref 135–145)
Total Bilirubin: 0.7 mg/dL (ref 0.3–1.2)
Total Protein: 6.3 g/dL — ABNORMAL LOW (ref 6.5–8.1)

## 2021-10-04 MED ORDER — SODIUM CHLORIDE 0.9% FLUSH
10.0000 mL | Freq: Once | INTRAVENOUS | Status: AC
  Administered 2021-10-04: 11:00:00 10 mL

## 2021-10-04 MED ORDER — HEPARIN SOD (PORK) LOCK FLUSH 100 UNIT/ML IV SOLN
500.0000 [IU] | Freq: Once | INTRAVENOUS | Status: AC
  Administered 2021-10-04: 11:00:00 500 [IU]

## 2021-10-04 MED ORDER — GABAPENTIN 300 MG PO CAPS: 300.0000 mg | ORAL_CAPSULE | Freq: Two times a day (BID) | ORAL | 2 refills | Status: DC

## 2021-10-04 NOTE — Assessment & Plan Note (Signed)
She had persistent neuropathy Gabapentin is helpful I have refilled her prescription I warned her about risk of constipation

## 2021-10-04 NOTE — Progress Notes (Signed)
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[place]) Her examination is benign She is not symptomatic We will continue treatment as scheduled She will return in 8 weeks with blood draw, port flush and further follow-up She does not need regular surveillance imaging study in the absence of symptoms  Anemia due to antineoplastic chemotherapy The cause of her anemia is multifactorial, combination of anemia chronic illness  She is not bleeding and not symptomatic Observe only for now  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   All questions were answered. The patient knows to call the clinic with any problems, questions or concerns. The total time spent in the appointment was 20 minutes encounter with patients including review of chart and various tests results, discussions about plan of care and coordination of care plan   Heath Lark, MD 10/04/2021 12:09 PM  INTERVAL HISTORY: Please see below for problem oriented charting. she returns for treatment follow-up on Rubraca for maintenance treatment for ovarian cancer She is doing well No recent infection Denies abdominal bloating or changes in bowel habits She started taking gabapentin again for neuropathy  REVIEW OF SYSTEMS:   Constitutional: Denies fevers, chills or abnormal weight loss Eyes: Denies blurriness of vision Ears, nose, mouth, throat, and face: Denies mucositis or sore throat Respiratory: Denies cough, dyspnea or wheezes Cardiovascular: Denies palpitation, chest discomfort or lower extremity swelling Gastrointestinal:  Denies nausea, heartburn or change in bowel habits Skin: Denies abnormal  skin rashes Lymphatics: Denies new lymphadenopathy or easy bruising Behavioral/Psych: Mood is stable, no new changes  All other systems were reviewed with the patient and are negative.  I have reviewed the past medical history, past surgical history, social history and family history with the patient and they are unchanged from previous note.  ALLERGIES:  is allergic to penicillins and rosuvastatin.  MEDICATIONS:  Current Outpatient Medications  Medication Sig Dispense Refill   acetaminophen (TYLENOL) 500 MG tablet Take 1 tablet (500 mg total) by mouth every 6 (six) hours as needed for moderate pain. 100 tablet 2   ALPRAZolam (XANAX) 0.5 MG tablet Take 0.5 mg by mouth at bedtime as needed for sleep.     amLODipine (NORVASC) 5 MG tablet Take 5 mg by mouth daily.     calcium carbonate (TUMS) 500 MG chewable tablet Chew 1 tablet (200 mg of elemental calcium total) by mouth daily. 30 tablet 0   cholecalciferol (VITAMIN D3) 25 MCG (1000 UNIT) tablet Take 2,000 Units by mouth daily.     Cyanocobalamin 1000 MCG/ML KIT Inject 1,000 mcg as directed every 14 (fourteen) days.     famotidine (PEPCID) 40 MG tablet Take by mouth.     FLUoxetine (PROZAC) 20 MG capsule Take 20 mg by mouth daily after breakfast.     gabapentin (NEURONTIN) 300 MG capsule Take 1 capsule (300 mg total) by mouth 2 (two) times daily. 90 capsule 2   glimepiride (AMARYL) 1 MG tablet Take 1 mg by mouth daily as needed. If CBG is elevated     hydrochlorothiazide (HYDRODIURIL) 25 MG tablet Take 25 mg by mouth daily.     levothyroxine (SYNTHROID) 100 MCG tablet Take 1 tablet by mouth daily.  lidocaine-prilocaine (EMLA) cream Apply 1 application topically as needed. 30 g 9   linaclotide (LINZESS) 145 MCG CAPS capsule Take 145 mcg by mouth daily before breakfast.     losartan (COZAAR) 100 MG tablet Take 100 mg by mouth at bedtime.      Melatonin 5 MG TABS Take 5-10 mg by mouth at bedtime as needed (sleep).      metFORMIN  (GLUCOPHAGE-XR) 500 MG 24 hr tablet Take 1,000 mg by mouth 2 (two) times daily.      RUBRACA 300 MG tablet TAKE 1 TABLET (300 MG TOTAL) BY MOUTH 2 (TWO) TIMES DAILY. 60 tablet 11   traMADol (ULTRAM) 50 MG tablet tramadol 50 mg tablet  Take 1 tablet twice a day by oral route as needed for 10 days.     No current facility-administered medications for this visit.    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 - TUMOR INVOLVES OVARY SURFACE AND LEFT FALLOPIAN TUBE. - SEE ONCOLOGY TABLE. 2. Mesentery, small bowel mesentery biopsy #1 - BENIGN FIBROADIPOSE TISSUE. 3. Ovary and fallopian tube, right - BENIGN OVARY WITH INCLUSION CYSTS. - BENIGN FALLOPIAN TUBE WITH PARATUBAL CYSTS AND ADENOFIBROMA. 4. Omentum, resection for tumor - BENIGN ADIPOSE TISSUE. 5. Peritoneum, biopsy, left diaphragmatic - BENIGN PERITONEAL TYPE TISSUE. 6. Peritoneum, biopsy, right diaphragmatic - BENIGN PERITONEAL TYPE TISSUE. 7. Mesentery, small bowel mesenteric biopsy #2 - BENIGN FIBROADIPOSE TISSUE. 8. Lymph nodes, regional resection, right pelvic - FIVE OF FIVE LYMPH NODES NEGATIVE FOR CARCINOMA (0/5). 9. Lymph nodes, regional resection, left pelvic - FOUR OF FOUR LYMPH NODES NEGATIVE FOR CARCINOMA (0/4). 10. Peritoneum, biopsy, left gutter - BENIGN PERITONEAL TYPE TISSUE. 11. Peritoneum, biopsy, bladder - BENIGN PERITONEAL TYPE TISSUE WITH ACUTE INFLAMMATION AND CALCIFICATIONS.  12. Peritoneum, biopsy, cul-de-sac - BENIGN PERITONEAL TYPE TISSUE WITH ACUTE INFLAMMATION AND CALCIFICATIONS. 13. Soft tissue, biopsy, right gutter -  BENIGN FIBROADIPOSE TISSUE. 14. Lymph node, biopsy, right para-aortic - TWO OF TWO LYMPH NODES NEGATIVE FOR CARCINOMA (0/2). 15. Lymph node, biopsy, left para-aortic - ONE OF ONE LYMPH NODES NEGATIVE FOR CARCINOMA (0/1). Microscopic Comment 1. OVARY Specimen(s): Left ovary and fallopian tube. Procedure: (including lymph node sampling): Bilateral salpingo-oophorectomy with omental and peritoneal biopsies and lymph node biopsies. Primary tumor site (including laterality): Left ovary. Ovarian surface involvement: Present. Ovarian capsule intact without fragmentation: Intact. Maximum tumor size (cm): 11 cm total. Histologic type: Serous cystadenocarcinoma. Grade: High grade (low grade areas also present). Peritoneal implants: (specify invasive or non-invasive): N/A. Pelvic extension (list additional structures on separate lines and if involved): Left fallopian tube. Lymph nodes: number examined 12 ; number positive 0 TNM code: pT2a, pN0, pMX FIGO Stage (based on pathologic findings, needs clinical correlation): IIA Comments: None.   01/31/2018 Pathology Results   PERITONEAL WASHING (SPECIMEN 1 OF 1 COLLECTED 01/31/18): MALIGNANT CELLS PRESENT CONSISTENT WITH METASTATIC CARCINOMA.   01/31/2018 Surgery   Procedure(s) Performed:  1. Robotic BSO and washings. 2. Exploratory laparotomy, Staging including infragastric omentectomy, Bilateral pelvic and paraaortic lymphadenectomy, pertioneal biopsies.    Specimens: Bilateral tubes / ovaries, bilateral pelvic and paraaortic lymph nodes, peritoneal biopsies, washings and omentum.  Operative Findings: The left adnexa was adherent to the left pelvic peritoneum suspected due to the inferior retraction from the vaginal hysterectomy. Intraoperative leakage of cystic fluid from left ovary.  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  The right adnexa grossly appeared normal although slightly enlarged for her  age.  There were some indurated lymph nodes but no overtly malignant lymph nodes were suspected.  Small bowel mesentery had 3-4 superficial possible implants.  1 of these was sent for frozen section returned benign.  She did have adhesive disease from her open cholecystectomy in the right upper quadrant.  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 16 mm exophytic lesion upper pole right kidney has attenuation too high to be a simple cyst. This may be a cyst complicated by proteinaceous debris or hemorrhage, but renal cell carcinoma is a concern. Routine outpatient follow-up MRI of the abdomen without and with contrast recommended after resolution of patient's acute symptoms (so she is better able to participate with positioning and breath holding). 2. Mild edema/possible minimal hemorrhage in the extraperitoneal soft tissues of the pelvic for tracking up both pelvic sidewalls. Imaging appearance is not outside of the spectrum of findings expected 6 days after the reported surgery. Trace interloop mesenteric and free fluid is identified in the peritoneal cavity in there is some mild edema in the omentum. There is no organized or rim enhancing collection to suggest evolving abscess. 3. Gas in the extraperitoneal soft tissues of the left lower quadrant is associated with gas in the subcutaneous fat of the lower left anterior abdominal wall. This is not unexpected on postoperative day 6. No evidence for intraperitoneal free air. 4. No CT features to suggest small bowel obstruction. Oral contrast material has  migrated about halfway through the small bowel loops but there is no differential distention of proximal versus distal small bowel. The colon is diffusely distended and fluid-filled proximally, but formed stool is noted in the distal colon. Given apparent slow migration of contrast and fluid-filled small and large bowel loops, a component of ileus is possible.   02/14/2018 Imaging   MR abdomen Benign Bosniak category 2 cyst in upper pole of right kidney, corresponding with lesion seen on previous CT. No evidence of renal neoplasm or other significant abnormality.     02/18/2018 Cancer Staging   Staging form: Ovary, Fallopian Tube, and Primary Peritoneal Carcinoma, AJCC 8th Edition - Pathologic: Stage II (pT2, pN0, cM0) - Signed by Heath Lark, MD on 02/18/2018    02/28/2018 Procedure   Status post right IJ port catheter placement. 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 For last cycle, taxol was omitted   07/18/2018 Imaging   1. No evidence for residual or recurrent tumor within the abdomen or pelvis. There has been interval resolution edema and fluid  within the peritoneal cavity. 2. New pulmonary nodule is identified within the right lower lobe measuring 1.1 cm, image 87/4. Consider one of the following in 3 months for both low-risk and high-risk individuals: (a) repeat chest CT, (b) follow-up PET-CT, or (c) tissue sampling. This recommendation follows the consensus statement: Guidelines for Management of Incidental Pulmonary Nodules Detected on CT Images: From the Fleischner Society 2017; Radiology 2017; 284:228-243. 3. Stable 7 mm nodule within the right upper lobe. 4.  Aortic Atherosclerosis (ICD10-I70.0). 5. Multi vessel coronary artery atherosclerotic  calcifications.   07/18/2018 Tumor Marker   Patient's tumor was tested for the following markers: CA-125 Results of the tumor marker test revealed 14.9   07/25/2018 PET scan   1. Regressing right lower lobe peripheral lung lesion, likely resolving inflammatory or atelectatic process. No hypermetabolism to suggest malignancy. I would recommend a follow-up noncontrast chest CT and 4-6 months. Small upper lobe pulmonary nodules are stable. 2. 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   07/31/2018 -  Chemotherapy   The patient started taking rucaparib   10/25/2018 Tumor Marker   Patient's tumor was tested for the following markers: CA-125 Results of the tumor marker test revealed 12.1   11/25/2018 Imaging   1. Right lower lobe nodule has resolved. Additional right lung nodules are stable. 2. Stable hazy nodularity along the left external iliac chain.  3. Hepatomegaly. 4. Aortic atherosclerosis (ICD10-170.0). Coronary artery calcification. 5. Enlarged pulmonic trunk, indicative of pulmonary arterial hypertension.   01/16/2019 Tumor Marker   Patient's tumor was tested for the following markers: CA-125 Results of the tumor marker test revealed 13.1   05/22/2019 Tumor Marker   Patient's tumor was tested for the following markers: CA-125 Results of the tumor marker test revealed 14.4   06/24/2019 Imaging   Ct chest, abdomen and pelvis 1. There are multiple clustered tiny centrilobular nodules and irregular opacities of the bilateral lower lobes and lingula, increased compared to prior examination, with underlying bandlike scarring of the medial right middle lobe and right lung base (e.g. series 4, image 100, 110, 123). 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  2. Stable 6 mm pulmonary nodule of the right upper lobe (series 4, image 45). Attention on  follow-up.   3. Status post hysterectomy and oophorectomy. No evidence of recurrent or metastatic disease in the abdomen or pelvis.   4. There are occasional sigmoid diverticula and focal fat stranding about a diverticulum of the mid sigmoid (series 2, image 103). Correlate for signs and symptoms of acute diverticulitis.   5. Other chronic, incidental, and postoperative findings as detailed above.   06/24/2019 Tumor Marker   Patient's tumor was tested for the following markers: CA-125 Results of the tumor marker test revealed 14.4.   08/19/2019 Tumor Marker   Patient's tumor was tested for the following markers: CA-125 Results of the tumor marker test revealed 10.3   09/25/2019 Imaging   CT chest 1. Interval resolution of previously demonstrated clustered centrilobular nodularity at both lung bases consistent with resolved inflammation. Stable 6 mm right upper lobe nodule consistent with a benign finding. No new or enlarging pulmonary nodules.  2. Stable chronic bronchiectasis and scarring in the right middle lobe and lingula. 3. No evidence of metastatic disease. 4. Aortic Atherosclerosis (ICD10-I70.0).   10/30/2019 Tumor Marker   Patient's tumor was tested for the following markers: CA-125 Results of the tumor marker test revealed  12.1   12/25/2019 Tumor Marker   Patient's tumor was tested for the following markers: CA-125. Results of the tumor marker test revealed 14.8.   02/19/2020 Tumor Marker   Patient's tumor was tested for the following markers: CA-125. Results of the tumor marker test revealed 15.5   04/22/2020 Tumor Marker   Patient's tumor was tested for the following markers: CA-125 Results of the tumor marker test revealed 8.9   06/16/2020 Tumor Marker   Patient's tumor was tested for the following markers: CA-125 Results of the tumor marker test revealed 12.5   06/23/2020 Imaging   1. Stable exam. No new or progressive findings to suggest recurrent or metastatic  disease. 2. Aortic Atherosclerosis (ICD10-I70.0).   08/19/2020 Tumor Marker   Patient's tumor was tested for the following markers: CA-125 Results of the tumor marker test revealed 19.1   09/21/2020 Tumor Marker   Patient's tumor was tested for the following markers: CA-125 Results of the tumor marker test revealed 12.7.   11/17/2020 Tumor Marker   Patient's tumor was tested for the following markers: CA-125 Results of the tumor marker test revealed 14.7   01/11/2021 Tumor Marker   Patient's tumor was tested for the following markers: CA-125 Results of the tumor marker test revealed 11.6   02/24/2021 Tumor Marker   Patient's tumor was tested for the following markers: CA125. Results of the tumor marker test revealed 11.8   04/21/2021 Tumor Marker   Patient's tumor was tested for the following markers: CA-125. Results of the tumor marker test revealed 10.9.   06/24/2021 Tumor Marker   Patient's tumor was tested for the following markers: CA-125. Results of the tumor marker test revealed 12.9.   08/11/2021 Tumor Marker   Patient's tumor was tested for the following markers: CA-125. Results of the tumor marker test revealed 12.6.     PHYSICAL EXAMINATION: ECOG PERFORMANCE STATUS: 1 - Symptomatic but completely ambulatory  Vitals:   10/04/21 1102  BP: (!) 146/72  Pulse: 66  Resp: 18  Temp: 97.8 F (36.6 C)  SpO2: 98%   Filed Weights   10/04/21 1102  Weight: 164 lb 6.4 oz (74.6 kg)    GENERAL:alert, no distress and comfortable SKIN: skin color, texture, turgor are normal, no rashes or significant lesions EYES: normal, Conjunctiva are pink and non-injected, sclera clear OROPHARYNX:no exudate, no erythema and lips, buccal mucosa, and tongue normal  NECK: supple, thyroid normal size, non-tender, without nodularity LYMPH:  no palpable lymphadenopathy in the cervical, axillary or inguinal LUNGS: clear to auscultation and percussion with normal breathing effort HEART: regular  rate & rhythm and no murmurs and no lower extremity edema ABDOMEN:abdomen soft, non-tender and normal bowel sounds Musculoskeletal:no cyanosis of digits and no clubbing  NEURO: alert & oriented x 3 with fluent speech, no focal motor/sensory deficits  LABORATORY DATA:  I have reviewed the data as listed    Component Value Date/Time   NA 139 10/04/2021 1034   K 4.3 10/04/2021 1034   CL 107 10/04/2021 1034   CO2 26 10/04/2021 1034   GLUCOSE 102 (H) 10/04/2021 1034   BUN 20 10/04/2021 1034   CREATININE 0.86 10/04/2021 1034   CREATININE 0.93 02/13/2019 1024   CALCIUM 9.2 10/04/2021 1034   PROT 6.3 (L) 10/04/2021 1034   ALBUMIN 4.0 10/04/2021 1034   AST 24 10/04/2021 1034   AST 26 02/13/2019 1024   ALT 18 10/04/2021 1034   ALT 23 02/13/2019 1024   ALKPHOS 51 10/04/2021 1034   BILITOT  0.7 10/04/2021 1034   BILITOT 0.8 02/13/2019 1024   GFRNONAA >60 10/04/2021 1034   GFRNONAA 59 (L) 02/13/2019 1024   GFRAA >60 06/16/2020 0918   GFRAA >60 02/13/2019 1024    No results found for: SPEP, UPEP  Lab Results  Component Value Date   WBC 5.6 10/04/2021   NEUTROABS 3.0 10/04/2021   HGB 11.7 (L) 10/04/2021   HCT 35.2 (L) 10/04/2021   MCV 99.7 10/04/2021   PLT 167 10/04/2021      Chemistry      Component Value Date/Time   NA 139 10/04/2021 1034   K 4.3 10/04/2021 1034   CL 107 10/04/2021 1034   CO2 26 10/04/2021 1034   BUN 20 10/04/2021 1034   CREATININE 0.86 10/04/2021 1034   CREATININE 0.93 02/13/2019 1024      Component Value Date/Time   CALCIUM 9.2 10/04/2021 1034   ALKPHOS 51 10/04/2021 1034   AST 24 10/04/2021 1034   AST 26 02/13/2019 1024   ALT 18 10/04/2021 1034   ALT 23 02/13/2019 1024   BILITOT 0.7 10/04/2021 1034   BILITOT 0.8 02/13/2019 1024

## 2021-10-04 NOTE — Assessment & Plan Note (Signed)
The cause of her anemia is multifactorial, combination of anemia chronic illness  She is not bleeding and not symptomatic Observe only for now

## 2021-10-04 NOTE — Assessment & Plan Note (Signed)
Her examination is benign She is not symptomatic We will continue treatment as scheduled She will return in 8 weeks with blood draw, port flush and further follow-up She does not need regular surveillance imaging study in the absence of symptoms

## 2021-10-05 ENCOUNTER — Telehealth: Payer: Self-pay | Admitting: Oncology

## 2021-10-05 ENCOUNTER — Other Ambulatory Visit (HOSPITAL_COMMUNITY): Payer: Self-pay

## 2021-10-05 LAB — CA 125: Cancer Antigen (CA) 125: 14.5 U/mL (ref 0.0–38.1)

## 2021-10-05 NOTE — Telephone Encounter (Signed)
Called Steph and advised her of CA 125 results.  Also went over CBC and CMP results. She verbalized understanding of results and did not have any questions.

## 2021-10-06 ENCOUNTER — Other Ambulatory Visit (HOSPITAL_COMMUNITY): Payer: Self-pay

## 2021-11-01 ENCOUNTER — Encounter: Payer: Self-pay | Admitting: Hematology and Oncology

## 2021-11-01 ENCOUNTER — Telehealth: Payer: Self-pay

## 2021-11-01 ENCOUNTER — Other Ambulatory Visit (HOSPITAL_COMMUNITY): Payer: Self-pay

## 2021-11-01 NOTE — Telephone Encounter (Signed)
Oral Oncology Patient Advocate Encounter   Was successful in securing patient a $5000 grant from [place] to provide copayment coverage for Rubraca.  This will keep the out of pocket expense at $0.    The billing information is as follows and has been shared with [place].   Member ID: [id number]  Group ID: [id number] RxBin: [id number] PCN: [id number] Dates of Eligibility: 11/01/21 through 11/01/22  Fund name:  Ovarian.  [place] Patient [place] Phone [phone] Fax [phone] 11/01/2021 12:29 PM

## 2021-11-01 NOTE — Telephone Encounter (Signed)
Oral Oncology Patient Advocate Encounter   Was successful in securing patient a $4000 grant from Patient [place] (PAF) to provide copayment coverage for Rubraca.  This will keep the out of pocket expense at $0.     The billing information is as follows and has been shared with [place]: [id number] PCN:  [id number] Member ID: [id number] Group ID: [id number] Dates of Eligibility: 11/01/21 through 11/02/22  [place] Patient [place] Phone [phone] Fax [phone] 11/01/2021 12:24 PM

## 2021-11-02 ENCOUNTER — Encounter: Payer: Self-pay | Admitting: Hematology and Oncology

## 2021-11-02 ENCOUNTER — Other Ambulatory Visit (HOSPITAL_COMMUNITY): Payer: Self-pay

## 2021-11-02 ENCOUNTER — Telehealth: Payer: Self-pay

## 2021-11-02 NOTE — Telephone Encounter (Signed)
Oral Oncology Patient Advocate Encounter   Was successful in securing patient an $56 grant from Patient [place] [facility]) to provide copayment coverage for Rubraca.  This will keep the out of pocket expense at $0.     I have spoken with the patient.    The billing information is as follows and has been shared with [place].   Member ID: [id number]  Group ID: [id number] RxBin: [id number] Dates of Eligibility: 08/04/21 through 08/03/22  Fund:  [place] Patient [place] Phone [phone] Fax [phone] 11/02/2021 12:42 PM

## 2021-11-03 ENCOUNTER — Encounter: Payer: Self-pay | Admitting: Hematology and Oncology

## 2021-11-03 ENCOUNTER — Other Ambulatory Visit (HOSPITAL_COMMUNITY): Payer: Self-pay

## 2021-11-07 ENCOUNTER — Other Ambulatory Visit (HOSPITAL_COMMUNITY): Payer: Self-pay

## 2021-11-17 ENCOUNTER — Other Ambulatory Visit (HOSPITAL_COMMUNITY): Payer: Self-pay

## 2021-11-19 ENCOUNTER — Other Ambulatory Visit: Payer: Self-pay

## 2021-11-19 ENCOUNTER — Emergency Department (HOSPITAL_COMMUNITY)
Admission: EM | Admit: 2021-11-19 | Discharge: 2021-11-19 | Disposition: A | Discharge status: Home / Self Care | Payer: Medicare HMO | Attending: Emergency Medicine | Admitting: Emergency Medicine

## 2021-11-19 ENCOUNTER — Encounter (HOSPITAL_COMMUNITY): Payer: Self-pay | Admitting: Emergency Medicine

## 2021-11-19 DIAGNOSIS — Z7984 Long term (current) use of oral hypoglycemic drugs: Secondary | ICD-10-CM | POA: Diagnosis not present

## 2021-11-19 DIAGNOSIS — H8111 Benign paroxysmal vertigo, right ear: Secondary | ICD-10-CM | POA: Insufficient documentation

## 2021-11-19 DIAGNOSIS — R42 Dizziness and giddiness: Secondary | ICD-10-CM | POA: Diagnosis present

## 2021-11-19 LAB — CBG MONITORING, ED: Glucose-Capillary: 89 mg/dL (ref 70–99)

## 2021-11-19 NOTE — ED Notes (Signed)
EDP at [facility] 

## 2021-11-19 NOTE — ED Notes (Signed)
Epley maneuvers in progress

## 2021-11-19 NOTE — Discharge Instructions (Addendum)
You were evaluated in the Emergency Department and after careful evaluation, we did not find any emergent condition requiring admission or further testing in the hospital.  Your exam/testing today was overall reassuring.  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  Your symptoms improved following an Epley maneuver.  You previously have had a negative MRI outpatient making stroke less likely.  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  We discussed the risks and benefits of further vascular imaging which you declined at this time.  Please return to the Emergency Department if you experience any worsening of your condition.  Thank you for allowing [place] to be a part of your care.

## 2021-11-19 NOTE — ED Notes (Signed)
Back from b/r, steady gait.

## 2021-11-19 NOTE — ED Notes (Signed)
Reports feel better after Epley maneuver. Declines w/c. Out with daughter using quad cane.

## 2021-11-19 NOTE — ED Provider Notes (Addendum)
[facility] EMERGENCY DEPARTMENT Provider Note   CSN: [id number] Arrival date & time: 11/19/21  1448     History  Chief Complaint  Patient presents with   Dizziness    [name] is a 81 y.o. female.   Dizziness  81 year old female presenting to the emergency department with a chief complaint of inner ear dysequilibrium all week. Described as a room spinning. Worse with movement. Called EMS on Weds and EMS felt she should follow-up with her PCP rather than be evaluated in the ED. Saw PCP on Thursday and prescribed meclizine. Got an outpatient MRI which was normal. Symptoms have been intermittent since onset on 2/1. Has been taking Meclizine with some improvement. Much worse with head movement, right sided symptoms.   I can see the patient's clinic notes from earlier this week and saw that an MRI was performed, but cannot see MRI results in [place].  Also states that 3-4 weeks ago she fell and hit her head. She had a negative CTH then and follow-up with her PCP 8 days later.  Per outpatient PCP notes, "This patient comes in today accompanied by a family friend for significant onset of dizziness which actually started yesterday. She felt fine during the day and then in the evening time she noticed with lying down in the bed room seem to be spinning with her she was so severe she actually had gotten up to go to the bathroom and she said she just almost did not get back to the bedroom and when she laid back down in the bed it was as if things went black she did not pass out she has tried to set up again the same thing she immediately felt need to fall backwards on the bed again did not pass out. She called her daughter early this morning and indicated she was concerned she needed to go to the emergency room because she just could not get up she actually did call 911 they came out to the house sounded a bit confusing in the sense that the exam was frustrating to her  because she called for an ambulance and the person on the telephone tried to give her some advice did not feel like and somebody need to come out the patient appropriately was able to get someone to come out her blood pressure was systolic 235 range when she asked what the diastolic was I could not tell her, they did check a blood sugar which was okay they did a pulse check on her which was in the 60s the patient has a watch that we will record her heart rate and she said she saw it about 48 or 50 she is on no rate lowering medications though. They did not do an EKG they told her that they thought it was vertigo and that if she went to the emergency room likely she was said in triage for 8 hours. They recommended she contact her PCP office and be seen here. She was able to ambulate in the office with some assistance she was not turning her head suddenly from side to side she said if she looked up she could tell there was a component of dizziness present and if she turns suddenly it set it off. She denied chest pain she denies shortness of breath, of note she had a fall about a month ago in the shower with a significant hematoma still with some soreness to the occiput she went to the emergency  room on that visit had a CT scan which was negative. I did see her in subsequent follow-up and she was improving but she had no associated dizziness post but likely felt to have a little bit of concussion. She just had a physical last week with labs that were done and largely everything was stable she was not having dizziness at that time."  Home Medications Prior to Admission medications   Medication Sig Start Date End Date Taking? Authorizing Provider  acetaminophen (TYLENOL) 500 MG tablet Take 1 tablet (500 mg total) by mouth every 6 (six) hours as needed for moderate pain. 02/10/21 02/10/22  Nita Sells, MD  ALPRAZolam Duanne Moron) 0.5 MG tablet Take 0.5 mg by mouth at bedtime as needed for sleep.    [provider]  amLODipine (NORVASC) 5 MG tablet Take 5 mg by mouth daily. 01/05/20   [provider]  calcium carbonate (TUMS) 500 MG chewable tablet Chew 1 tablet (200 mg of elemental calcium total) by mouth daily. 08/11/21   Heath Lark, MD  cholecalciferol (VITAMIN D3) 25 MCG (1000 UNIT) tablet Take 2,000 Units by mouth daily.    [provider]  Cyanocobalamin 1000 MCG/ML KIT Inject 1,000 mcg as directed every 14 (fourteen) days.    [provider]  famotidine (PEPCID) 40 MG tablet Take by mouth. 03/01/21   [provider]  FLUoxetine (PROZAC) 20 MG capsule Take 20 mg by mouth daily after breakfast.    [provider]  gabapentin (NEURONTIN) 300 MG capsule Take 1 capsule (300 mg total) by mouth 2 (two) times daily. 10/04/21   Heath Lark, MD  glimepiride (AMARYL) 1 MG tablet Take 1 mg by mouth daily as needed. If CBG is elevated 06/20/19   [provider]  hydrochlorothiazide (HYDRODIURIL) 25 MG tablet Take 25 mg by mouth daily.    [provider]  levothyroxine (SYNTHROID) 100 MCG tablet Take 1 tablet by mouth daily. 03/05/21   [provider]  lidocaine-prilocaine (EMLA) cream Apply 1 application topically as needed. 08/11/21   Heath Lark, MD  linaclotide (LINZESS) 145 MCG CAPS capsule Take 145 mcg by mouth daily before breakfast.    [provider]  losartan (COZAAR) 100 MG tablet Take 100 mg by mouth at bedtime.     [provider]  Melatonin 5 MG TABS Take 5-10 mg by mouth at bedtime as needed (sleep).     [provider]  metFORMIN (GLUCOPHAGE-XR) 500 MG 24 hr tablet Take 1,000 mg by mouth 2 (two) times daily.     [provider]  RUBRACA 300 MG tablet TAKE 1 TABLET (300 MG TOTAL) BY MOUTH 2 (TWO) TIMES DAILY. 03/07/21 03/07/22  Heath Lark, MD  traMADol (ULTRAM) 50 MG tablet tramadol 50 mg tablet  Take 1 tablet twice a day by oral route as needed for 10 days.    [provider]       Allergies    Penicillins and Rosuvastatin    Review of Systems   Review of Systems  Neurological:  Positive for dizziness.  All other systems reviewed and are negative.  Physical Exam Updated Vital Signs BP (!) 165/75    Pulse (!) 55    Temp 98.6 F (37 C) (Oral)    Resp 12    SpO2 100%  Physical Exam Vitals and nursing note reviewed.  Constitutional:      General: She is not in acute distress.    Appearance: She is well-developed.  HENT:  Head: Normocephalic and atraumatic.     Ears:     Comments: Dix-Hallpike maneuver positive on the right with nystagmus present.  Eyes:     Conjunctiva/sclera: Conjunctivae normal.  Cardiovascular:     Rate and Rhythm: Normal rate and regular rhythm.     Heart sounds: No murmur heard. Pulmonary:     Effort: Pulmonary effort is normal. No respiratory distress.     Breath sounds: Normal breath sounds.  Abdominal:     Palpations: Abdomen is soft.     Tenderness: There is no abdominal tenderness.  Musculoskeletal:        General: No swelling.     Cervical back: Neck supple.  Skin:    General: Skin is warm and dry.     Capillary Refill: Capillary refill takes less than 2 seconds.  Neurological:     Mental Status: She is alert.     Comments: MENTAL STATUS EXAM:    Orientation: Alert and oriented to person, place and time.  Memory: Cooperative, follows commands well.  Language: Speech is clear and language is normal.   CRANIAL NERVES:    CN 2 (Optic): Visual fields intact to confrontation.  CN 3,4,6 (EOM): Pupils equal and reactive to light. Full extraocular eye movement without nystagmus.  CN 5 (Trigeminal): Facial sensation is normal, no weakness of masticatory muscles.  CN 7 (Facial): No facial weakness or asymmetry.  CN 8 (Auditory): Auditory acuity grossly normal.  CN 9,10 (Glossophar): The uvula is midline, the palate elevates symmetrically.  CN 11 (spinal access): Normal sternocleidomastoid and trapezius strength.  CN  12 (Hypoglossal): The tongue is midline. No atrophy or fasciculations.Marland Kitchen   MOTOR:  Muscle Strength: 5/5RUE, 5/5LUE, 5/5RLE, 5/5LLE.   COORDINATION:   Intact finger-to-nose, no tremor.   SENSATION:   Intact to light touch all four extremities.  GAIT: Gait normal without ataxia   Psychiatric:        Mood and Affect: Mood normal.    ED Results / Procedures / Treatments   Labs (all labs ordered are listed, but only abnormal results are displayed) Labs Reviewed  CBG MONITORING, ED    EKG EKG Interpretation  Date/Time:  Saturday November 19 2021 15:09:52 EST Ventricular Rate:  56 PR Interval:  145 QRS Duration: 85 QT Interval:  440 QTC Calculation: 425 R Axis:   69 Text Interpretation: Sinus rhythm Anteroseptal infarct, old Confirmed by Regan Lemming ([id number]) on 11/19/2021 3:53:45 PM  Radiology No results found.  Procedures Procedures    Medications Ordered in ED Medications - No data to display  ED Course/ Medical Decision Making/ A&P                           Medical Decision Making  81 year old female presenting to the emergency department with a chief complaint of inner ear dysequilibrium all week. Described as a room spinning. Worse with movement. Called EMS on Weds and EMS felt she should follow-up with her PCP rather than be evaluated in the ED. Saw PCP on Thursday and prescribed meclizine. Got an outpatient MRI which was normal. Symptoms have been intermittent since onset on 2/1. Has been taking Meclizine with some improvement. Much worse with head movement, right sided symptoms.   I can see the patient's clinic notes from earlier this week and saw that an MRI was performed, but cannot see MRI results in [place].  I was able to review the patient's MRI results when she pulled them up  in her MyChart which revealed no acute stroke, evidence of chronic microvascular ischemic changes.  Also states that 3-4 weeks ago she fell and hit her head. She had a negative CTH  then and follow-up with her PCP 8 days later.  On arrival, the patient was afebrile, hemodynamically stable, not significantly hypertensive, BP 145/77, with normal sinus rhythm noted on cardiac telemetry.  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  CVA/TIA has already been ruled out by MRI imaging.  Low concern for vertebral artery dissection, cervical artery dissection, vertebrobasilar insufficiency, spinal injury, cervical or vertebral stenosis.  Triggers include lying flat towards the right.  The patient had a physical exam concerning for a positive Dix-Hallpike maneuver on the right.  Epley maneuver was subsequently performed with resolution in the patient's symptoms.  Overall presentation is very consistent with BPPV rather than vascular abnormality.  She previously had MRI imaging of the brain outpatient which ruled out cerebellar stroke.  CBG was performed was normal and an EKG revealed no acute ischemic changes or abnormal intervals.  Mild sinus bradycardia noted with a ventricular rate of 5 6.  The patient has no presyncope or syncopal concerns at this time.  She is neurologically intact status post Epley maneuver with resolution in symptoms.   I discussed with the patient need for further imaging versus plan for discharge and the patient is very comfortable and agrees with plan for discharge rather than imaging for vascular abnormality given resolution of her symptoms following Epley maneuver and positive Dix-Hallpike.  Instructions provided regarding performing Epley maneuver at home.  Encouraged meclizine as needed, trying Epley maneuver at home as needed.  Encouraged PCP and ENT follow-up as needed if symptoms return.  Final Clinical Impression(s) / ED Diagnoses Final diagnoses:  Benign paroxysmal positional vertigo of right ear    Rx / DC Orders ED Discharge Orders     None         Regan Lemming, MD 11/19/21 1739    Regan Lemming,  MD 11/19/21 1743

## 2021-11-19 NOTE — ED Triage Notes (Signed)
Patient reports balance/ gait issue last week- went to PCP and given meds and had an MRI. Started on meclizine. States she took one yesterday for dizziness and continued this morning. Reports a head injury last month. Dizziness worse with movement.

## 2021-11-28 ENCOUNTER — Other Ambulatory Visit (HOSPITAL_COMMUNITY): Payer: Self-pay

## 2021-11-29 ENCOUNTER — Inpatient Hospital Stay: Payer: Medicare HMO | Admitting: Hematology and Oncology

## 2021-11-29 ENCOUNTER — Inpatient Hospital Stay: Payer: Medicare HMO | Attending: Hematology and Oncology

## 2021-11-29 ENCOUNTER — Other Ambulatory Visit: Payer: Self-pay

## 2021-11-29 DIAGNOSIS — C562 Malignant neoplasm of left ovary: Secondary | ICD-10-CM | POA: Diagnosis not present

## 2021-11-29 DIAGNOSIS — R5381 Other malaise: Secondary | ICD-10-CM | POA: Diagnosis not present

## 2021-11-29 LAB — CBC WITH DIFFERENTIAL/PLATELET
Abs Immature Granulocytes: 0.03 10*3/uL (ref 0.00–0.07)
Basophils Absolute: 0.1 10*3/uL (ref 0.0–0.1)
Basophils Relative: 1 %
Eosinophils Absolute: 0.1 10*3/uL (ref 0.0–0.5)
Eosinophils Relative: 2 %
HCT: 35.5 % — ABNORMAL LOW (ref 36.0–46.0)
Hemoglobin: 12 g/dL (ref 12.0–15.0)
Immature Granulocytes: 1 %
Lymphocytes Relative: 31 %
Lymphs Abs: 2 10*3/uL (ref 0.7–4.0)
MCH: 33.8 pg (ref 26.0–34.0)
MCHC: 33.8 g/dL (ref 30.0–36.0)
MCV: 100 fL (ref 80.0–100.0)
Monocytes Absolute: 0.4 10*3/uL (ref 0.1–1.0)
Monocytes Relative: 6 %
Neutro Abs: 3.9 10*3/uL (ref 1.7–7.7)
Neutrophils Relative %: 59 %
Platelets: 173 10*3/uL (ref 150–400)
RBC: 3.55 MIL/uL — ABNORMAL LOW (ref 3.87–5.11)
RDW: 14.4 % (ref 11.5–15.5)
WBC: 6.5 10*3/uL (ref 4.0–10.5)
nRBC: 0 % (ref 0.0–0.2)

## 2021-11-29 LAB — CMP (CANCER CENTER ONLY)
ALT: 28 U/L (ref 0–44)
AST: 31 U/L (ref 15–41)
Albumin: 3.9 g/dL (ref 3.5–5.0)
Alkaline Phosphatase: 50 U/L (ref 38–126)
Anion gap: 5 (ref 5–15)
BUN: 24 mg/dL — ABNORMAL HIGH (ref 8–23)
CO2: 25 mmol/L (ref 22–32)
Calcium: 9.3 mg/dL (ref 8.9–10.3)
Chloride: 106 mmol/L (ref 98–111)
Creatinine: 0.78 mg/dL (ref 0.44–1.00)
GFR, Estimated: >60 mL/min (ref >60)
Glucose, Bld: 101 mg/dL — ABNORMAL HIGH (ref 70–99)
Potassium: 4.3 mmol/L (ref 3.5–5.1)
Sodium: 136 mmol/L (ref 135–145)
Total Bilirubin: 0.5 mg/dL (ref 0.3–1.2)
Total Protein: 6.3 g/dL — ABNORMAL LOW (ref 6.5–8.1)

## 2021-11-29 MED ORDER — SODIUM CHLORIDE 0.9% FLUSH
10.0000 mL | Freq: Once | INTRAVENOUS | Status: AC
  Administered 2021-11-29: 10 mL

## 2021-11-29 MED ORDER — HEPARIN SOD (PORK) LOCK FLUSH 100 UNIT/ML IV SOLN
500.0000 [IU] | Freq: Once | INTRAVENOUS | Status: AC
  Administered 2021-11-29: 500 [IU]

## 2021-11-30 ENCOUNTER — Encounter: Payer: Self-pay | Admitting: Hematology and Oncology

## 2021-11-30 ENCOUNTER — Telehealth: Payer: Self-pay | Admitting: Oncology

## 2021-11-30 LAB — CA 125: Cancer Antigen (CA) 125: 13 U/mL (ref 0.0–38.1)

## 2021-11-30 NOTE — Progress Notes (Signed)
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[facility]) Her examination is benign She is not symptomatic We will continue treatment as scheduled She will return in 8 weeks with blood draw, port flush and further follow-up She does not need regular surveillance imaging study in the absence of symptoms We discussed the risk and benefits of port removal She will think about it  Physical debility She fell at home recently and was evaluated extensively without further injuries The patient will be more careful in the future I do not believe her recent fall is related to her treatment  Orders Placed This Encounter  Procedures   CMP ([place] only)    All questions were answered. The patient knows to call the clinic with any problems, questions or concerns. The total time spent in the appointment was 20 minutes encounter with patients including review of chart and various tests results, discussions about plan of care and coordination of care plan   Heath Lark, MD 11/30/2021 9:15 AM  INTERVAL HISTORY: Please see below for problem oriented charting. she returns for treatment follow-up on Rubraca for history of ovarian cancer She fell at home recently She was evaluated extensively She had a bruise at the back of her head but no other major injuries Her fall was purely accidental and, not related to weakness Otherwise, she tolerated treatment well No recent changes in bowel habits  REVIEW OF SYSTEMS:   Constitutional: Denies fevers, chills or abnormal weight loss Eyes: Denies blurriness of vision Ears, nose, mouth, throat, and face: Denies mucositis or sore throat Respiratory: Denies cough, dyspnea or wheezes Cardiovascular: Denies palpitation, chest discomfort or lower extremity swelling Gastrointestinal:  Denies nausea, heartburn or change in bowel  habits Skin: Denies abnormal skin rashes Lymphatics: Denies new lymphadenopathy or easy bruising Neurological:Denies numbness, tingling or new weaknesses Behavioral/Psych: Mood is stable, no new changes  All other systems were reviewed with the patient and are negative.  I have reviewed the past medical history, past surgical history, social history and family history with the patient and they are unchanged from previous note.  ALLERGIES:  is allergic to penicillins and rosuvastatin.  MEDICATIONS:  Current Outpatient Medications  Medication Sig Dispense Refill   acetaminophen (TYLENOL) 500 MG tablet Take 1 tablet (500 mg total) by mouth every 6 (six) hours as needed for moderate pain. 100 tablet 2   ALPRAZolam (XANAX) 0.5 MG tablet Take 0.5 mg by mouth at bedtime as needed for sleep.     amLODipine (NORVASC) 5 MG tablet Take 5 mg by mouth daily.     calcium carbonate (TUMS) 500 MG chewable tablet Chew 1 tablet (200 mg of elemental calcium total) by mouth daily. 30 tablet 0   cholecalciferol (VITAMIN D3) 25 MCG (1000 UNIT) tablet Take 2,000 Units by mouth daily.     Cyanocobalamin 1000 MCG/ML KIT Inject 1,000 mcg as directed every 14 (fourteen) days.     famotidine (PEPCID) 40 MG tablet Take by mouth.     FLUoxetine (PROZAC) 20 MG capsule Take 20 mg by mouth daily after breakfast.     gabapentin (NEURONTIN) 300 MG capsule Take 1 capsule (300 mg total) by mouth 2 (two) times daily. 90 capsule 2   glimepiride (AMARYL) 1 MG tablet Take 1 mg by mouth daily as needed. If CBG is elevated     hydrochlorothiazide (HYDRODIURIL) 25 MG  tablet Take 25 mg by mouth daily.     levothyroxine (SYNTHROID) 100 MCG tablet Take 1 tablet by mouth daily.     lidocaine-prilocaine (EMLA) cream Apply 1 application topically as needed. 30 g 9   linaclotide (LINZESS) 145 MCG CAPS capsule Take 145 mcg by mouth daily before breakfast.     losartan (COZAAR) 100 MG tablet Take 100 mg by mouth at bedtime.      Melatonin  5 MG TABS Take 5-10 mg by mouth at bedtime as needed (sleep).      metFORMIN (GLUCOPHAGE-XR) 500 MG 24 hr tablet Take 1,000 mg by mouth 2 (two) times daily.      RUBRACA 300 MG tablet TAKE 1 TABLET (300 MG TOTAL) BY MOUTH 2 (TWO) TIMES DAILY. 60 tablet 11   traMADol (ULTRAM) 50 MG tablet tramadol 50 mg tablet  Take 1 tablet twice a day by oral route as needed for 10 days.     No current facility-administered medications for this visit.    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 - TUMOR INVOLVES OVARY SURFACE AND LEFT FALLOPIAN TUBE. - SEE ONCOLOGY TABLE. 2. Mesentery, small bowel mesentery biopsy #1 - BENIGN FIBROADIPOSE TISSUE. 3. Ovary and fallopian tube, right - BENIGN OVARY WITH INCLUSION CYSTS. - BENIGN FALLOPIAN TUBE WITH PARATUBAL CYSTS AND ADENOFIBROMA. 4. Omentum, resection for tumor - BENIGN ADIPOSE TISSUE. 5. Peritoneum, biopsy, left diaphragmatic - BENIGN PERITONEAL TYPE TISSUE. 6. Peritoneum, biopsy, right diaphragmatic - BENIGN PERITONEAL TYPE TISSUE. 7. Mesentery, small bowel mesenteric biopsy #2 - BENIGN FIBROADIPOSE TISSUE. 8. Lymph nodes, regional resection, right pelvic - FIVE OF FIVE LYMPH NODES NEGATIVE FOR CARCINOMA (0/5). 9. Lymph nodes, regional resection, left pelvic - FOUR OF FOUR LYMPH NODES NEGATIVE FOR CARCINOMA (0/4). 10. Peritoneum, biopsy, left gutter - BENIGN PERITONEAL TYPE TISSUE. 11. Peritoneum, biopsy, bladder - BENIGN PERITONEAL TYPE TISSUE WITH ACUTE INFLAMMATION AND CALCIFICATIONS.  12. Peritoneum, biopsy, cul-de-sac - BENIGN PERITONEAL TYPE TISSUE WITH ACUTE  INFLAMMATION AND CALCIFICATIONS. 13. Soft tissue, biopsy, right gutter - BENIGN FIBROADIPOSE TISSUE. 14. Lymph node, biopsy, right para-aortic - TWO OF TWO LYMPH NODES NEGATIVE FOR CARCINOMA (0/2). 15. Lymph node, biopsy, left para-aortic - ONE OF ONE LYMPH NODES NEGATIVE FOR CARCINOMA (0/1). Microscopic Comment 1. OVARY Specimen(s): Left ovary and fallopian tube. Procedure: (including lymph node sampling): Bilateral salpingo-oophorectomy with omental and peritoneal biopsies and lymph node biopsies. Primary tumor site (including laterality): Left ovary. Ovarian surface involvement: Present. Ovarian capsule intact without fragmentation: Intact. Maximum tumor size (cm): 11 cm total. Histologic type: Serous cystadenocarcinoma. Grade: High grade (low grade areas also present). Peritoneal implants: (specify invasive or non-invasive): N/A. Pelvic extension (list additional structures on separate lines and if involved): Left fallopian tube. Lymph nodes: number examined 12 ; number positive 0 TNM code: pT2a, pN0, pMX FIGO Stage (based on pathologic findings, needs clinical correlation): IIA Comments: None.   01/31/2018 Pathology Results   PERITONEAL WASHING (SPECIMEN 1 OF 1 COLLECTED 01/31/18): MALIGNANT CELLS PRESENT CONSISTENT WITH METASTATIC CARCINOMA.   01/31/2018 Surgery   Procedure(s) Performed:  1. Robotic BSO and washings. 2. Exploratory laparotomy, Staging including infragastric omentectomy, Bilateral  pelvic and paraaortic lymphadenectomy, pertioneal biopsies.    Specimens: Bilateral tubes / ovaries, bilateral pelvic and paraaortic lymph nodes, peritoneal biopsies, washings and omentum.  Operative Findings: The left adnexa was adherent to the left pelvic peritoneum suspected due to the inferior retraction from the vaginal hysterectomy. Intraoperative leakage of cystic fluid from left ovary.  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   The right adnexa grossly appeared normal although slightly enlarged for her age.  There were some indurated lymph nodes but no overtly malignant lymph nodes were suspected.  Small bowel mesentery had 3-4 superficial possible implants.  1 of these was sent for frozen section returned benign.  She did have adhesive disease from her open cholecystectomy in the right upper quadrant.  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 16 mm exophytic lesion upper pole right kidney has attenuation too high to be a simple cyst. This may be a cyst complicated by proteinaceous debris or hemorrhage, but renal cell carcinoma is a concern. Routine outpatient follow-up MRI of the abdomen without and with contrast recommended after resolution of patient's acute symptoms (so she is better able to participate with positioning and breath holding). 2. Mild edema/possible minimal hemorrhage in the extraperitoneal soft tissues of the pelvic for tracking up both pelvic sidewalls. Imaging appearance is not outside of the spectrum of findings expected 6 days after the reported surgery. Trace interloop mesenteric and free fluid is identified in the peritoneal cavity in there is some mild edema in the omentum. There is no organized or rim enhancing collection to suggest evolving abscess. 3. Gas in the extraperitoneal soft tissues of the left lower quadrant is associated with gas in the subcutaneous fat of the lower left anterior abdominal wall. This is not unexpected on postoperative day 6. No evidence for intraperitoneal free air. 4. No CT  features to suggest small bowel obstruction. Oral contrast material has migrated about halfway through the small bowel loops but there is no differential distention of proximal versus distal small bowel. The colon is diffusely distended and fluid-filled proximally, but formed stool is noted in the distal colon. Given apparent slow migration of contrast and fluid-filled small and large bowel loops, a component of ileus is possible.   02/14/2018 Imaging   MR abdomen Benign Bosniak category 2 cyst in upper pole of right kidney, corresponding with lesion seen on previous CT. No evidence of renal neoplasm or other significant abnormality.     02/18/2018 Cancer Staging   Staging form: Ovary, Fallopian Tube, and Primary Peritoneal Carcinoma, AJCC 8th Edition - Pathologic: Stage II (pT2, pN0, cM0) - Signed by Heath Lark, MD on 02/18/2018    02/28/2018 Procedure   Status post right IJ port catheter placement. 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 For last cycle, taxol was omitted  07/18/2018 Imaging   1. No evidence for residual or recurrent tumor within the abdomen or pelvis. There has been interval resolution edema and fluid within the peritoneal cavity. 2. New pulmonary nodule is identified within the right lower lobe measuring 1.1 cm, image 87/4. Consider one of the following in 3 months for both low-risk and high-risk individuals: (a) repeat chest CT, (b) follow-up PET-CT, or (c) tissue sampling. This recommendation follows the consensus statement: Guidelines for Management of Incidental Pulmonary Nodules Detected on CT Images: From the Fleischner Society 2017; Radiology 2017; 284:228-243. 3. Stable 7 mm nodule within the right upper lobe. 4.  Aortic  Atherosclerosis (ICD10-I70.0). 5. Multi vessel coronary artery atherosclerotic calcifications.   07/18/2018 Tumor Marker   Patient's tumor was tested for the following markers: CA-125 Results of the tumor marker test revealed 14.9   07/25/2018 PET scan   1. Regressing right lower lobe peripheral lung lesion, likely resolving inflammatory or atelectatic process. No hypermetabolism to suggest malignancy. I would recommend a follow-up noncontrast chest CT and 4-6 months. Small upper lobe pulmonary nodules are stable. 2. 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   07/31/2018 -  Chemotherapy   The patient started taking rucaparib   10/25/2018 Tumor Marker   Patient's tumor was tested for the following markers: CA-125 Results of the tumor marker test revealed 12.1   11/25/2018 Imaging   1. Right lower lobe nodule has resolved. Additional right lung nodules are stable. 2. Stable hazy nodularity along the left external iliac chain.  3. Hepatomegaly. 4. Aortic atherosclerosis (ICD10-170.0). Coronary artery calcification. 5. Enlarged pulmonic trunk, indicative of pulmonary arterial hypertension.   01/16/2019 Tumor Marker   Patient's tumor was tested for the following markers: CA-125 Results of the tumor marker test revealed 13.1   05/22/2019 Tumor Marker   Patient's tumor was tested for the following markers: CA-125 Results of the tumor marker test revealed 14.4   06/24/2019 Imaging   Ct chest, abdomen and pelvis 1. There are multiple clustered tiny centrilobular nodules and irregular opacities of the bilateral lower lobes and lingula, increased compared to prior examination, with underlying bandlike scarring of the medial right middle lobe and right lung base (e.g. series 4, image 100, 110, 123). 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  2. Stable 6  mm pulmonary nodule of the right upper lobe (series 4, image 45). Attention on follow-up.   3. Status post hysterectomy and oophorectomy. No evidence of recurrent or metastatic disease in the abdomen or pelvis.   4. There are occasional sigmoid diverticula and focal fat stranding about a diverticulum of the mid sigmoid (series 2, image 103). Correlate for signs and symptoms of acute diverticulitis.   5. Other chronic, incidental, and postoperative findings as detailed above.   06/24/2019 Tumor Marker   Patient's tumor was tested for the following markers: CA-125 Results of the tumor marker test revealed 14.4.   08/19/2019 Tumor Marker   Patient's tumor was tested for the following markers: CA-125 Results of the tumor marker test revealed 10.3   09/25/2019 Imaging   CT chest 1. Interval resolution of previously demonstrated clustered centrilobular nodularity at both lung bases consistent with resolved inflammation. Stable 6 mm right upper lobe nodule consistent with a benign finding. No new or enlarging pulmonary nodules.  2. Stable chronic bronchiectasis and scarring in the right middle lobe and lingula. 3. No evidence of metastatic disease. 4. Aortic  Atherosclerosis (ICD10-I70.0).   10/30/2019 Tumor Marker   Patient's tumor was tested for the following markers: CA-125 Results of the tumor marker test revealed 12.1   12/25/2019 Tumor Marker   Patient's tumor was tested for the following markers: CA-125. Results of the tumor marker test revealed 14.8.   02/19/2020 Tumor Marker   Patient's tumor was tested for the following markers: CA-125. Results of the tumor marker test revealed 15.5   04/22/2020 Tumor Marker   Patient's tumor was tested for the following markers: CA-125 Results of the tumor marker test revealed 8.9   06/16/2020 Tumor Marker   Patient's tumor was tested for the following markers: CA-125 Results of the tumor marker test revealed 12.5   06/23/2020 Imaging   1. Stable  exam. No new or progressive findings to suggest recurrent or metastatic disease. 2. Aortic Atherosclerosis (ICD10-I70.0).   08/19/2020 Tumor Marker   Patient's tumor was tested for the following markers: CA-125 Results of the tumor marker test revealed 19.1   09/21/2020 Tumor Marker   Patient's tumor was tested for the following markers: CA-125 Results of the tumor marker test revealed 12.7.   11/17/2020 Tumor Marker   Patient's tumor was tested for the following markers: CA-125 Results of the tumor marker test revealed 14.7   01/11/2021 Tumor Marker   Patient's tumor was tested for the following markers: CA-125 Results of the tumor marker test revealed 11.6   02/24/2021 Tumor Marker   Patient's tumor was tested for the following markers: CA125. Results of the tumor marker test revealed 11.8   04/21/2021 Tumor Marker   Patient's tumor was tested for the following markers: CA-125. Results of the tumor marker test revealed 10.9.   06/24/2021 Tumor Marker   Patient's tumor was tested for the following markers: CA-125. Results of the tumor marker test revealed 12.9.   08/11/2021 Tumor Marker   Patient's tumor was tested for the following markers: CA-125. Results of the tumor marker test revealed 12.6.   11/30/2021 Tumor Marker   Patient's tumor was tested for the following markers: CA-125. Results of the tumor marker test revealed 13.0.     PHYSICAL EXAMINATION: ECOG PERFORMANCE STATUS: 1 - Symptomatic but completely ambulatory  Vitals:   11/29/21 1033  BP: 118/60  Pulse: (!) 58  Resp: 18  Temp: 98.4 F (36.9 C)  SpO2: 100%   Filed Weights   11/29/21 1033  Weight: 164 lb 9.6 oz (74.7 kg)    GENERAL:alert, no distress and comfortable SKIN: skin color, texture, turgor are normal, no rashes or significant lesions EYES: normal, Conjunctiva are pink and non-injected, sclera clear OROPHARYNX:no exudate, no erythema and lips, buccal mucosa, and tongue normal  NECK: supple,  thyroid normal size, non-tender, without nodularity LYMPH:  no palpable lymphadenopathy in the cervical, axillary or inguinal LUNGS: clear to auscultation and percussion with normal breathing effort HEART: regular rate & rhythm and no murmurs and no lower extremity edema ABDOMEN:abdomen soft, non-tender and normal bowel sounds Musculoskeletal:no cyanosis of digits and no clubbing  NEURO: alert & oriented x 3 with fluent speech, no focal motor/sensory deficits  LABORATORY DATA:  I have reviewed the data as listed    Component Value Date/Time   NA 136 11/29/2021 0955   K 4.3 11/29/2021 0955   CL 106 11/29/2021 0955   CO2 25 11/29/2021 0955   GLUCOSE 101 (H) 11/29/2021 0955   BUN 24 (H) 11/29/2021 0955   CREATININE 0.78 11/29/2021 0955   CALCIUM 9.3 11/29/2021 0955  PROT 6.3 (L) 11/29/2021 0955   ALBUMIN 3.9 11/29/2021 0955   AST 31 11/29/2021 0955   ALT 28 11/29/2021 0955   ALKPHOS 50 11/29/2021 0955   BILITOT 0.5 11/29/2021 0955   GFRNONAA >60 11/29/2021 0955   GFRAA >60 06/16/2020 0918   GFRAA >60 02/13/2019 1024    No results found for: SPEP, UPEP  Lab Results  Component Value Date   WBC 6.5 11/29/2021   NEUTROABS 3.9 11/29/2021   HGB 12.0 11/29/2021   HCT 35.5 (L) 11/29/2021   MCV 100.0 11/29/2021   PLT 173 11/29/2021      Chemistry      Component Value Date/Time   NA 136 11/29/2021 0955   K 4.3 11/29/2021 0955   CL 106 11/29/2021 0955   CO2 25 11/29/2021 0955   BUN 24 (H) 11/29/2021 0955   CREATININE 0.78 11/29/2021 0955      Component Value Date/Time   CALCIUM 9.3 11/29/2021 0955   ALKPHOS 50 11/29/2021 0955   AST 31 11/29/2021 0955   ALT 28 11/29/2021 0955   BILITOT 0.5 11/29/2021 0955

## 2021-11-30 NOTE — Telephone Encounter (Signed)
Called [name] with her CA 125 results.  She verbalized understanding and mentioned that she starting having burning and stinging with urination last night.  She is going to call her primary care doctor to see if she can get an appointment.  If not, she will call us back.

## 2021-11-30 NOTE — Assessment & Plan Note (Signed)
Her examination is benign She is not symptomatic We will continue treatment as scheduled She will return in 8 weeks with blood draw, port flush and further follow-up She does not need regular surveillance imaging study in the absence of symptoms We discussed the risk and benefits of port removal She will think about it

## 2021-11-30 NOTE — Assessment & Plan Note (Signed)
She fell at home recently and was evaluated extensively without further injuries The patient will be more careful in the future I do not believe her recent fall is related to her treatment

## 2021-12-02 ENCOUNTER — Other Ambulatory Visit (HOSPITAL_COMMUNITY): Payer: Self-pay

## 2021-12-07 ENCOUNTER — Encounter: Payer: Self-pay | Admitting: Hematology and Oncology

## 2021-12-23 ENCOUNTER — Other Ambulatory Visit (HOSPITAL_COMMUNITY): Payer: Self-pay

## 2022-01-02 ENCOUNTER — Encounter: Payer: Self-pay | Admitting: Hematology and Oncology

## 2022-01-02 ENCOUNTER — Ambulatory Visit (HOSPITAL_COMMUNITY)
Admission: RE | Admit: 2022-01-02 | Discharge: 2022-01-02 | Disposition: A | Discharge status: Home / Self Care | Payer: Medicare HMO | Source: Ambulatory Visit | Attending: Hematology and Oncology | Admitting: Hematology and Oncology

## 2022-01-02 ENCOUNTER — Telehealth: Payer: Self-pay | Admitting: Oncology

## 2022-01-02 ENCOUNTER — Other Ambulatory Visit: Payer: Self-pay

## 2022-01-02 ENCOUNTER — Inpatient Hospital Stay: Payer: Medicare HMO | Attending: Hematology and Oncology

## 2022-01-02 ENCOUNTER — Telehealth: Payer: Self-pay

## 2022-01-02 ENCOUNTER — Other Ambulatory Visit: Payer: Self-pay | Admitting: Hematology and Oncology

## 2022-01-02 ENCOUNTER — Inpatient Hospital Stay: Payer: Medicare HMO | Admitting: Hematology and Oncology

## 2022-01-02 VITALS — BP 111/58 | HR 72 | Resp 18 | Ht 64.0 in | Wt 164.4 lb

## 2022-01-02 DIAGNOSIS — K5909 Other constipation: Secondary | ICD-10-CM | POA: Insufficient documentation

## 2022-01-02 DIAGNOSIS — K5732 Diverticulitis of large intestine without perforation or abscess without bleeding: Secondary | ICD-10-CM | POA: Diagnosis not present

## 2022-01-02 DIAGNOSIS — R1032 Left lower quadrant pain: Secondary | ICD-10-CM

## 2022-01-02 DIAGNOSIS — C562 Malignant neoplasm of left ovary: Secondary | ICD-10-CM

## 2022-01-02 HISTORY — DX: Left lower quadrant pain: R10.32

## 2022-01-02 LAB — CBC WITH DIFFERENTIAL/PLATELET
Abs Immature Granulocytes: 0.04 10*3/uL (ref 0.00–0.07)
Basophils Absolute: 0.1 10*3/uL (ref 0.0–0.1)
Basophils Relative: 1 %
Eosinophils Absolute: 0.1 10*3/uL (ref 0.0–0.5)
Eosinophils Relative: 1 %
HCT: 35.3 % — ABNORMAL LOW (ref 36.0–46.0)
Hemoglobin: 12.1 g/dL (ref 12.0–15.0)
Immature Granulocytes: 0 %
Lymphocytes Relative: 18 %
Lymphs Abs: 1.8 10*3/uL (ref 0.7–4.0)
MCH: 34.8 pg — ABNORMAL HIGH (ref 26.0–34.0)
MCHC: 34.3 g/dL (ref 30.0–36.0)
MCV: 101.4 fL — ABNORMAL HIGH (ref 80.0–100.0)
Monocytes Absolute: 0.8 10*3/uL (ref 0.1–1.0)
Monocytes Relative: 8 %
Neutro Abs: 7.7 10*3/uL (ref 1.7–7.7)
Neutrophils Relative %: 72 %
Platelets: 183 10*3/uL (ref 150–400)
RBC: 3.48 MIL/uL — ABNORMAL LOW (ref 3.87–5.11)
RDW: 14.3 % (ref 11.5–15.5)
WBC: 10.5 10*3/uL (ref 4.0–10.5)
nRBC: 0 % (ref 0.0–0.2)

## 2022-01-02 LAB — COMPREHENSIVE METABOLIC PANEL
ALT: 12 U/L (ref 0–44)
AST: 12 U/L — ABNORMAL LOW (ref 15–41)
Albumin: 3.8 g/dL (ref 3.5–5.0)
Alkaline Phosphatase: 57 U/L (ref 38–126)
Anion gap: 7 (ref 5–15)
BUN: 17 mg/dL (ref 8–23)
CO2: 28 mmol/L (ref 22–32)
Calcium: 9.4 mg/dL (ref 8.9–10.3)
Chloride: 103 mmol/L (ref 98–111)
Creatinine, Ser: 0.95 mg/dL (ref 0.44–1.00)
GFR, Estimated: >60 mL/min (ref >60)
Glucose, Bld: 118 mg/dL — ABNORMAL HIGH (ref 70–99)
Potassium: 3.9 mmol/L (ref 3.5–5.1)
Sodium: 138 mmol/L (ref 135–145)
Total Bilirubin: 0.9 mg/dL (ref 0.3–1.2)
Total Protein: 6.7 g/dL (ref 6.5–8.1)

## 2022-01-02 MED ORDER — TRAMADOL HCL 50 MG PO TABS
50.0000 mg | ORAL_TABLET | Freq: Four times a day (QID) | ORAL | 0 refills | Status: DC | PRN
Start: 2022-01-02 — End: 2022-07-17

## 2022-01-02 NOTE — Telephone Encounter (Signed)
Called [name] and advised her to go for an abdominal xray at [facility] at 12:00 followed by labs at the cancer center at 12:30 and apt to see [name] at 1:00.  She verbalized understanding and agreement of all appointments. ?

## 2022-01-02 NOTE — Assessment & Plan Note (Signed)
She have severe left lower abdominal pain of unknown etiology associated with recent changes in bowel habits ?She has diffuse tenderness on gentle palpation ?I recommend clear liquid diet and pain medicine as needed ?I plan to order urgent CT imaging for evaluation to rule out cancer recurrence or diverticulitis ?Abdominal x-ray is reviewed which showed no signs of bowel obstruction ?

## 2022-01-02 NOTE — Progress Notes (Signed)
[place] ?OFFICE PROGRESS NOTE ? ?Patient Care Team: ?[name]., MD as PCP - General (Internal Medicine) ? 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 ?Left lower quadrant abdominal pain ?She have severe left lower abdominal pain of unknown etiology associated with recent changes in bowel habits ?She has diffuse tenderness on gentle palpation ?I recommend clear liquid diet and pain medicine as needed ?I plan to order urgent CT imaging for evaluation to rule out cancer recurrence or diverticulitis ?Abdominal x-ray is reviewed which showed no signs of bowel obstruction ? ?Orders Placed This Encounter  ?Procedures  ? CT ABDOMEN PELVIS W CONTRAST  ?  Standing Status:   Future  ?  Standing Expiration Date:   01/03/2023  ?  Order Specific Question:   If indicated for the ordered procedure, I authorize the administration of contrast media per Radiology protocol  ?  Answer:   Yes  ?  Order Specific Question:   Preferred imaging location?  ?  Answer:   [facility]  ?  Order Specific Question:   Radiology Contrast Protocol - do NOT remove file path  ?  Answer:   [email or web address]  ? ? ?All questions were answered. The patient knows to call the clinic with any problems, questions or concerns. ?The total time spent in the appointment was 30 minutes encounter with patients including review of chart and various tests results, discussions about plan of care and coordination of care plan ?  ?Amy Lark, MD ?01/02/2022 1:14 PM ? ?INTERVAL HISTORY: ?Please see below for problem oriented charting. ?she returns for urgent evaluation ?Last week, she developed some constipation despite  taking Linzess ?She had no bowel movement for 2 days ?Since Saturday, she have small daily bowel movement without passage of flatus ?No nausea or vomiting ?Left lower quadrant part of her abdomen is diffusely tender ?She denies fever or chills ? ?REVIEW OF SYSTEMS:   ?Constitutional: Denies fevers, chills or abnormal weight loss ?Eyes: Denies blurriness of vision ?Ears, nose, mouth, throat, and face: Denies mucositis or sore throat ?Respiratory: Denies cough, dyspnea or wheezes ?Cardiovascular: Denies palpitation, chest discomfort or lower extremity swelling ?Skin: Denies abnormal skin rashes ?Lymphatics: Denies new lymphadenopathy or easy bruising ?Neurological:Denies numbness, tingling or new weaknesses ?Behavioral/Psych: Mood is stable, no new changes  ?All other systems were reviewed with the patient and are negative. ? ?I have reviewed the past medical history, past surgical history, social history and family history with the patient and they are unchanged from previous note. ? ?ALLERGIES:  is allergic to penicillins and rosuvastatin. ? ?MEDICATIONS:  ?Current Outpatient Medications  ?Medication Sig Dispense Refill  ? acetaminophen (TYLENOL) 500 MG tablet Take 1 tablet (500 mg total) by mouth every 6 (six) hours as needed for moderate pain. 100 tablet 2  ? ALPRAZolam (XANAX) 0.5 MG tablet Take 0.5 mg by mouth at bedtime as needed for sleep.    ? amLODipine (NORVASC) 5 MG tablet Take 5 mg by mouth daily.    ? calcium carbonate (TUMS) 500 MG chewable tablet Chew 1 tablet (200 mg of elemental calcium total) by mouth daily. 30 tablet 0  ? cholecalciferol (VITAMIN D3) 25 MCG (1000 UNIT) tablet Take 2,000  Units by mouth daily.    ? Cyanocobalamin 1000 MCG/ML KIT Inject 1,000 mcg as directed every 14 (fourteen) days.    ? famotidine (PEPCID) 40 MG tablet Take by mouth.    ? FLUoxetine (PROZAC) 20 MG capsule Take 20 mg by mouth daily after breakfast.    ? gabapentin (NEURONTIN) 300 MG capsule Take 1 capsule (300 mg  total) by mouth 2 (two) times daily. 90 capsule 2  ? glimepiride (AMARYL) 1 MG tablet Take 1 mg by mouth daily as needed. If CBG is elevated    ? hydrochlorothiazide (HYDRODIURIL) 25 MG tablet Take 25 mg by mouth daily.    ? levothyroxine (SYNTHROID) 100 MCG tablet Take 1 tablet by mouth daily.    ? lidocaine-prilocaine (EMLA) cream Apply 1 application topically as needed. 30 g 9  ? linaclotide (LINZESS) 145 MCG CAPS capsule Take 145 mcg by mouth daily before breakfast.    ? losartan (COZAAR) 100 MG tablet Take 100 mg by mouth at bedtime.     ? Melatonin 5 MG TABS Take 5-10 mg by mouth at bedtime as needed (sleep).     ? metFORMIN (GLUCOPHAGE-XR) 500 MG 24 hr tablet Take 1,000 mg by mouth 2 (two) times daily.     ? RUBRACA 300 MG tablet TAKE 1 TABLET (300 MG TOTAL) BY MOUTH 2 (TWO) TIMES DAILY. 60 tablet 11  ? traMADol (ULTRAM) 50 MG tablet Take 1 tablet (50 mg total) by mouth every 6 (six) hours as needed. 30 tablet 0  ? ?No current facility-administered medications for this visit.  ? ? 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 Patient's tumor was tested for the following markers: CA-125 ?Results of the tumor marker test revealed 11 ?  ?01/31/2018 Pathology Results  ? 1. 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 ?- TUMOR INVOLVES OVARY SURFACE AND LEFT FALLOPIAN TUBE. ?- SEE ONCOLOGY TABLE. ?2. Mesentery, small bowel mesentery biopsy #1 ?- BENIGN FIBROADIPOSE TISSUE. ?3. Ovary and fallopian tube, right ?- BENIGN OVARY WITH INCLUSION CYSTS. ?- BENIGN FALLOPIAN TUBE WITH PARATUBAL CYSTS AND ADENOFIBROMA. ?4. Omentum, resection for tumor ?- BENIGN ADIPOSE TISSUE. ?5. Peritoneum, biopsy, left diaphragmatic ?- BENIGN PERITONEAL TYPE TISSUE. ?6. Peritoneum, biopsy, right diaphragmatic ?- BENIGN PERITONEAL TYPE TISSUE. ?7.  Mesentery, small bowel mesenteric biopsy #2 ?- BENIGN FIBROADIPOSE TISSUE. ?8. Lymph nodes, regional resection, right pelvic ?- FIVE OF FIVE LYMPH NODES NEGATIVE FOR CARCINOMA (0/5). ?9. Lymph nodes, regional resection, left pelvic ?- FOUR OF FOUR LYMPH NODES NEGATIVE FOR CARCINOMA (0/4). ?10. Peritoneum, biopsy, left gutter ?- BENIGN PERITONEAL TYPE TISSUE. ?11. Peritoneum, biopsy, bladder ?- BENIGN PERITONEAL TYPE TISSUE WITH ACUTE INFLAMMATION AND CALCIFICATIONS. ? ?12. Peritoneum, biopsy, cul-de-sac ?- BENIGN PERITONEAL TYPE TISSUE WITH ACUTE INFLAMMATION AND CALCIFICATIONS. ?13. Soft tissue, biopsy, right gutter ?- BENIGN FIBROADIPOSE TISSUE. ?14. Lymph node, biopsy, right para-aortic ?- TWO OF TWO LYMPH NODES NEGATIVE FOR CARCINOMA (0/2). ?15. Lymph node, biopsy, left para-aortic ?- ONE OF ONE LYMPH NODES NEGATIVE FOR CARCINOMA (0/1). ?Microscopic Comment ?1. OVARY ?Specimen(s): Left ovary and fallopian tube. ?Procedure: (including lymph node sampling): Bilateral salpingo-oophorectomy with omental and peritoneal biopsies ?and lymph node biopsies. ?Primary tumor site (including laterality): Left ovary. ?Ovarian surface involvement: Present. ?Ovarian capsule intact without fragmentation: Intact. ?Maximum tumor size (cm): 11 cm total. ?Histologic type: Serous cystadenocarcinoma. ?Grade: High  grade (low grade areas also present). ?Peritoneal implants: (specify invasive or non-invasive): N/A. ?Pelvic extension (list additional structures on separate lines and if involved): Left fallopian tube. ?Lymph nodes: number examined 12 ; number positive 0 ?TNM code: pT2a, pN0, pMX ?FIGO Stage (based on pathologic findings, needs clinical correlation): IIA ?Comments: None. ?  ?01/31/2018 Pathology Results  ? PERITONEAL WASHING (SPECIMEN 1 OF 1 COLLECTED 01/31/18): ?MALIGNANT CELLS PRESENT CONSISTENT WITH METASTATIC CARCINOMA. ?  ?01/31/2018 Surgery  ? Procedure(s) Performed:  ?1. Robotic BSO and washings. ?2. Exploratory  laparotomy, Staging including infragastric omentectomy, Bilateral pelvic and paraaortic lymphadenectomy, pertioneal biopsies. ?   ?Specimens: Bilateral tubes / ovaries, bilateral pelvic and paraaortic lymph nodes,

## 2022-01-02 NOTE — Telephone Encounter (Addendum)
[name] called and said she has been having excruciating pain in her left side since Saturday.  She is rating it at a 7/10 now and said the pain is above her hip bone down to her pelvis.  Her abdomen is also bloated and hard.  The pain is worse when she tries to eat. She is concerned that she has a blockage.  She had a small bowel movement yesterday but is not passing gas.  She is taking Linzess but has not tried [employer] yet.  She is wondering if she can be seen today. ?

## 2022-01-02 NOTE — Assessment & Plan Note (Signed)
She has been stable on maintenance Rubraca for a long time ?Recent tumor marker was normal ?The cause of her severe abdominal pain is unknown but cancer recurrence cannot be excluded ?I recommend CT imaging as soon as possible for evaluation and she is in agreement ?

## 2022-01-02 NOTE — Telephone Encounter (Signed)
Called and reviewed upcoming appts. CT scheduled at [place] 3/22 at 8:30, arrive at 8 am, NPO 4 hours prior and drink 1st bottle at 0630 and 2nd bottle at 0730. Appt with [name] 3/23 at 2:45, arrive early for appt. She verbalized understanding. ?

## 2022-01-02 NOTE — Telephone Encounter (Signed)
I can see her at 1 pm. She needs to go to [facility] to get abdominal X ray first at 12, then labs with peripheral IV only at 1230. ?

## 2022-01-03 ENCOUNTER — Other Ambulatory Visit (HOSPITAL_COMMUNITY): Payer: Self-pay

## 2022-01-03 ENCOUNTER — Telehealth: Payer: Self-pay

## 2022-01-03 LAB — CA 125: Cancer Antigen (CA) 125: 11 U/mL (ref 0.0–38.1)

## 2022-01-03 NOTE — Telephone Encounter (Signed)
Returned her call. Reviewed upcoming appts. She verbalized understanding. ?

## 2022-01-04 ENCOUNTER — Other Ambulatory Visit: Payer: Self-pay

## 2022-01-04 ENCOUNTER — Ambulatory Visit (HOSPITAL_BASED_OUTPATIENT_CLINIC_OR_DEPARTMENT_OTHER)
Admission: RE | Admit: 2022-01-04 | Discharge: 2022-01-04 | Disposition: A | Discharge status: Home / Self Care | Payer: Medicare HMO | Source: Ambulatory Visit | Attending: Hematology and Oncology | Admitting: Hematology and Oncology

## 2022-01-04 ENCOUNTER — Other Ambulatory Visit (HOSPITAL_COMMUNITY): Payer: Self-pay

## 2022-01-04 DIAGNOSIS — C562 Malignant neoplasm of left ovary: Secondary | ICD-10-CM | POA: Insufficient documentation

## 2022-01-04 MED ORDER — IOHEXOL 300 MG/ML  SOLN
100.0000 mL | Freq: Once | INTRAMUSCULAR | Status: AC | PRN
  Administered 2022-01-04: 75 mL via INTRAVENOUS

## 2022-01-05 ENCOUNTER — Inpatient Hospital Stay: Payer: Medicare HMO | Admitting: Hematology and Oncology

## 2022-01-05 ENCOUNTER — Other Ambulatory Visit (HOSPITAL_COMMUNITY): Payer: Self-pay

## 2022-01-05 DIAGNOSIS — K5792 Diverticulitis of intestine, part unspecified, without perforation or abscess without bleeding: Secondary | ICD-10-CM | POA: Diagnosis not present

## 2022-01-05 DIAGNOSIS — C562 Malignant neoplasm of left ovary: Secondary | ICD-10-CM

## 2022-01-05 DIAGNOSIS — K5909 Other constipation: Secondary | ICD-10-CM | POA: Diagnosis not present

## 2022-01-05 MED ORDER — METRONIDAZOLE 500 MG PO TABS: 500.0000 mg | ORAL_TABLET | Freq: Three times a day (TID) | ORAL | 0 refills | Status: DC

## 2022-01-05 MED ORDER — CIPROFLOXACIN HCL 250 MG PO TABS: 250.0000 mg | ORAL_TABLET | Freq: Two times a day (BID) | ORAL | 0 refills | Status: DC

## 2022-01-06 ENCOUNTER — Encounter: Payer: Self-pay | Admitting: Hematology and Oncology

## 2022-01-06 DIAGNOSIS — K5792 Diverticulitis of intestine, part unspecified, without perforation or abscess without bleeding: Secondary | ICD-10-CM | POA: Insufficient documentation

## 2022-01-06 HISTORY — DX: Diverticulitis of intestine, part unspecified, without perforation or abscess without bleeding: K57.92

## 2022-01-06 NOTE — Progress Notes (Signed)
[place] ?OFFICE PROGRESS NOTE ? ?Patient Care Team: ?Raina Mina., MD as PCP - General (Internal Medicine) ? 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 ?Acute diverticulitis ?CT imaging confirmed likely diagnosis of acute diverticulitis ?She has intermittent abdominal pain but was able to tolerate modified diet ?She denies recent nausea or vomiting ?The patient had some drug allergies ?We discussed the risk and benefits of antibiotic treatment as well as bowel rest ?I will proceed to prescribe a course of ciprofloxacin and Flagyl ?However, if she felt that her symptoms are gradually improving, she can omit the antibiotic and just treat conservatively ?I will reassess again in 2 weeks for further follow-up ?She is instructed to hold Rubraca if she were to start antibiotics this weekend ? ?Other constipation ?She have history of chronic constipation and takes Linzess as needed ?We discussed the importance of getting regular bowel habits with laxatives  ? ?No orders of the defined types were placed in this encounter. ? ? ?All questions were answered. The patient knows to call the clinic with any problems, questions or concerns. ?The total time spent in the appointment was 30 minutes encounter with patients including review of chart and various tests results, discussions about plan of care and coordination of care plan ?  ?Heath Lark, MD ?01/06/2022 10:47 AM ? ?INTERVAL HISTORY: ?Please see below for problem oriented charting. ?she returns for treatment follow-up to review test results ?She continues to have intermittent abdominal pain but overall slightly improved since last time I saw her ?Denies recent loose stool ?No nausea vomiting ?The pain is manageable ?She has modified her diet recently ?Denies  fever or chills ? ?REVIEW OF SYSTEMS:   ?Constitutional: Denies fevers, chills or abnormal weight loss ?Eyes: Denies blurriness of vision ?Ears, nose, mouth, throat, and face: Denies mucositis or sore throat ?Respiratory: Denies cough, dyspnea or wheezes ?Cardiovascular: Denies palpitation, chest discomfort or lower extremity swelling ?Skin: Denies abnormal skin rashes ?Lymphatics: Denies new lymphadenopathy or easy bruising ?Neurological:Denies numbness, tingling or new weaknesses ?Behavioral/Psych: Mood is stable, no new changes  ?All other systems were reviewed with the patient and are negative. ? ?I have reviewed the past medical history, past surgical history, social history and family history with the patient and they are unchanged from previous note. ? ?ALLERGIES:  is allergic to penicillins and rosuvastatin. ? ?MEDICATIONS:  ?Current Outpatient Medications  ?Medication Sig Dispense Refill  ? ciprofloxacin (CIPRO) 250 MG tablet Take 1 tablet (250 mg total) by mouth 2 (two) times daily. 14 tablet 0  ? metroNIDAZOLE (FLAGYL) 500 MG tablet Take 1 tablet (500 mg total) by mouth 3 (three) times daily. 21 tablet 0  ? acetaminophen (TYLENOL) 500 MG tablet Take 1 tablet (500 mg total) by mouth every 6 (six) hours as needed for moderate pain. 100 tablet 2  ? ALPRAZolam (XANAX) 0.5 MG tablet Take 0.5 mg by mouth at bedtime as needed for sleep.    ? amLODipine (NORVASC) 5 MG tablet Take 5 mg by mouth daily.    ? calcium carbonate (TUMS) 500 MG chewable tablet Chew 1 tablet (200 mg of elemental calcium total) by mouth daily. 30 tablet 0  ? cholecalciferol (VITAMIN D3) 25 MCG (1000 UNIT) tablet Take 2,000 Units by mouth daily.    ? Cyanocobalamin  1000 MCG/ML KIT Inject 1,000 mcg as directed every 14 (fourteen) days.    ? famotidine (PEPCID) 40 MG tablet Take by mouth.    ? FLUoxetine (PROZAC) 20 MG capsule Take 20 mg by mouth daily after breakfast.    ? gabapentin (NEURONTIN) 300 MG capsule Take 1 capsule (300 mg total)  by mouth 2 (two) times daily. 90 capsule 2  ? glimepiride (AMARYL) 1 MG tablet Take 1 mg by mouth daily as needed. If CBG is elevated    ? hydrochlorothiazide (HYDRODIURIL) 25 MG tablet Take 25 mg by mouth daily.    ? levothyroxine (SYNTHROID) 100 MCG tablet Take 1 tablet by mouth daily.    ? lidocaine-prilocaine (EMLA) cream Apply 1 application topically as needed. 30 g 9  ? linaclotide (LINZESS) 145 MCG CAPS capsule Take 145 mcg by mouth daily before breakfast.    ? losartan (COZAAR) 100 MG tablet Take 100 mg by mouth at bedtime.     ? Melatonin 5 MG TABS Take 5-10 mg by mouth at bedtime as needed (sleep).     ? metFORMIN (GLUCOPHAGE-XR) 500 MG 24 hr tablet Take 1,000 mg by mouth 2 (two) times daily.     ? RUBRACA 300 MG tablet TAKE 1 TABLET (300 MG TOTAL) BY MOUTH 2 (TWO) TIMES DAILY. 60 tablet 11  ? traMADol (ULTRAM) 50 MG tablet Take 1 tablet (50 mg total) by mouth every 6 (six) hours as needed. 30 tablet 0  ? ?No current facility-administered medications for this visit.  ? ? 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 Patient's tumor was tested for the following markers: CA-125 ?Results of the tumor marker test revealed 11 ?  ?01/31/2018 Pathology Results  ? 1. 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 ?- TUMOR INVOLVES OVARY SURFACE AND LEFT FALLOPIAN TUBE. ?- SEE ONCOLOGY TABLE. ?2. Mesentery, small bowel mesentery biopsy #1 ?- BENIGN FIBROADIPOSE TISSUE. ?3. Ovary and fallopian tube, right ?- BENIGN OVARY WITH INCLUSION CYSTS. ?- BENIGN FALLOPIAN TUBE WITH PARATUBAL CYSTS AND ADENOFIBROMA. ?4. Omentum, resection for tumor ?- BENIGN ADIPOSE TISSUE. ?5. Peritoneum, biopsy, left diaphragmatic ?- BENIGN PERITONEAL TYPE TISSUE. ?6. Peritoneum, biopsy, right diaphragmatic ?- BENIGN PERITONEAL TYPE TISSUE. ?7.  Mesentery, small bowel mesenteric biopsy #2 ?- BENIGN FIBROADIPOSE TISSUE. ?8. Lymph nodes, regional resection, right pelvic ?- FIVE OF FIVE LYMPH NODES NEGATIVE FOR CARCINOMA (0/5). ?9. Lymph nodes, regional resection, left pelvic ?- FOUR OF FOUR LYMPH NODES NEGATIVE FOR CARCINOMA (0/4). ?10. Peritoneum, biopsy, left gutter ?- BENIGN PERITONEAL TYPE TISSUE. ?11. Peritoneum, biopsy, bladder ?- BENIGN PERITONEAL TYPE TISSUE WITH ACUTE INFLAMMATION AND CALCIFICATIONS. ? ?12. Peritoneum, biopsy, cul-de-sac ?- BENIGN PERITONEAL TYPE TISSUE WITH ACUTE INFLAMMATION AND CALCIFICATIONS. ?13. Soft tissue, biopsy, right gutter ?- BENIGN FIBROADIPOSE TISSUE. ?14. Lymph node, biopsy, right para-aortic ?- TWO OF TWO LYMPH NODES NEGATIVE FOR CARCINOMA (0/2). ?15. Lymph node, biopsy, left para-aortic ?- ONE OF ONE LYMPH NODES NEGATIVE FOR CARCINOMA (0/1). ?Microscopic Comment ?1. OVARY ?Specimen(s): Left ovary and fallopian tube. ?Procedure: (including lymph node sampling): Bilateral salpingo-oophorectomy with omental and peritoneal biopsies ?and lymph node biopsies. ?Primary tumor site (including laterality): Left ovary. ?Ovarian surface involvement: Present. ?Ovarian capsule intact without fragmentation: Intact. ?Maximum tumor size (cm): 11 cm total. ?Histologic type: Serous cystadenocarcinoma. ?Grade: High grade (low grade areas also present). ?Peritoneal implants: (specify  invasive or non-invasive): N/A. ?Pelvic extension (list additional structures on separate lines and if involved): Left fallopian tube. ?Lymph nodes: number examined 12 ; number positive 0 ?TNM code: pT2a, pN0, pMX ?FIGO Stage (based on pathologic findings, needs clinical correlation): IIA ?Comments: None. ?  ?01/31/2018 Pathology Results  ? PERITONEAL WASHING (SPECIMEN 1 OF 1 COLLECTED 01/31/18): ?MALIGNANT CELLS PRESENT CONSISTENT WITH METASTATIC CARCINOMA. ?  ?01/31/2018 Surgery  ? Procedure(s) Performed:  ?1. Robotic BSO and washings. ?2. Exploratory  laparotomy, Staging including infragastric omentectomy, Bilateral pelvic and paraaortic lymphadenectomy, pertioneal biopsies. ?   ?Specimens: Bilateral tubes / ovaries, bilateral pelvic and paraaortic lymph nodes, per

## 2022-01-06 NOTE — Assessment & Plan Note (Addendum)
CT imaging confirmed likely diagnosis of acute diverticulitis ?She has intermittent abdominal pain but was able to tolerate modified diet ?She denies recent nausea or vomiting ?The patient had some drug allergies ?We discussed the risk and benefits of antibiotic treatment as well as bowel rest ?I will proceed to prescribe a course of ciprofloxacin and Flagyl ?However, if she felt that her symptoms are gradually improving, she can omit the antibiotic and just treat conservatively ?I will reassess again in 2 weeks for further follow-up ?She is instructed to hold Rubraca if she were to start antibiotics this weekend ?

## 2022-01-06 NOTE — Assessment & Plan Note (Signed)
She have history of chronic constipation and takes Linzess as needed ?We discussed the importance of getting regular bowel habits with laxatives  ?

## 2022-01-06 NOTE — Assessment & Plan Note (Signed)
I have reviewed blood work and imaging study with the patient ?She have no signs of cancer recurrence ?She will continue Rubraca ?I will see her in 2 weeks for further follow-up ?

## 2022-01-20 ENCOUNTER — Other Ambulatory Visit: Payer: Self-pay

## 2022-01-20 ENCOUNTER — Inpatient Hospital Stay: Payer: Medicare HMO

## 2022-01-20 ENCOUNTER — Inpatient Hospital Stay (HOSPITAL_BASED_OUTPATIENT_CLINIC_OR_DEPARTMENT_OTHER): Payer: Medicare HMO | Admitting: Hematology and Oncology

## 2022-01-20 ENCOUNTER — Inpatient Hospital Stay: Payer: Medicare HMO | Attending: Hematology and Oncology

## 2022-01-20 ENCOUNTER — Encounter: Payer: Self-pay | Admitting: Hematology and Oncology

## 2022-01-20 VITALS — BP 142/58 | HR 65 | Temp 97.6°F | Resp 18 | Ht 64.0 in | Wt 164.0 lb

## 2022-01-20 DIAGNOSIS — D638 Anemia in other chronic diseases classified elsewhere: Secondary | ICD-10-CM | POA: Insufficient documentation

## 2022-01-20 DIAGNOSIS — R1032 Left lower quadrant pain: Secondary | ICD-10-CM | POA: Diagnosis not present

## 2022-01-20 DIAGNOSIS — C562 Malignant neoplasm of left ovary: Secondary | ICD-10-CM

## 2022-01-20 DIAGNOSIS — K5732 Diverticulitis of large intestine without perforation or abscess without bleeding: Secondary | ICD-10-CM | POA: Insufficient documentation

## 2022-01-20 DIAGNOSIS — T451X5A Adverse effect of antineoplastic and immunosuppressive drugs, initial encounter: Secondary | ICD-10-CM | POA: Diagnosis not present

## 2022-01-20 DIAGNOSIS — D6481 Anemia due to antineoplastic chemotherapy: Secondary | ICD-10-CM

## 2022-01-20 LAB — CBC WITH DIFFERENTIAL/PLATELET
Abs Immature Granulocytes: 0.02 10*3/uL (ref 0.00–0.07)
Basophils Absolute: 0.1 10*3/uL (ref 0.0–0.1)
Basophils Relative: 1 %
Eosinophils Absolute: 0.1 10*3/uL (ref 0.0–0.5)
Eosinophils Relative: 1 %
HCT: 34.6 % — ABNORMAL LOW (ref 36.0–46.0)
Hemoglobin: 11.5 g/dL — ABNORMAL LOW (ref 12.0–15.0)
Immature Granulocytes: 0 %
Lymphocytes Relative: 31 %
Lymphs Abs: 2.1 10*3/uL (ref 0.7–4.0)
MCH: 33.5 pg (ref 26.0–34.0)
MCHC: 33.2 g/dL (ref 30.0–36.0)
MCV: 100.9 fL — ABNORMAL HIGH (ref 80.0–100.0)
Monocytes Absolute: 0.5 10*3/uL (ref 0.1–1.0)
Monocytes Relative: 7 %
Neutro Abs: 4.1 10*3/uL (ref 1.7–7.7)
Neutrophils Relative %: 60 %
Platelets: 189 10*3/uL (ref 150–400)
RBC: 3.43 MIL/uL — ABNORMAL LOW (ref 3.87–5.11)
RDW: 14.4 % (ref 11.5–15.5)
WBC: 6.9 10*3/uL (ref 4.0–10.5)
nRBC: 0 % (ref 0.0–0.2)

## 2022-01-20 LAB — COMPREHENSIVE METABOLIC PANEL
ALT: 20 U/L (ref 0–44)
AST: 23 U/L (ref 15–41)
Albumin: 3.9 g/dL (ref 3.5–5.0)
Alkaline Phosphatase: 54 U/L (ref 38–126)
Anion gap: 7 (ref 5–15)
BUN: 25 mg/dL — ABNORMAL HIGH (ref 8–23)
CO2: 25 mmol/L (ref 22–32)
Calcium: 9.5 mg/dL (ref 8.9–10.3)
Chloride: 106 mmol/L (ref 98–111)
Creatinine, Ser: 0.92 mg/dL (ref 0.44–1.00)
GFR, Estimated: >60 mL/min (ref >60)
Glucose, Bld: 101 mg/dL — ABNORMAL HIGH (ref 70–99)
Potassium: 3.8 mmol/L (ref 3.5–5.1)
Sodium: 138 mmol/L (ref 135–145)
Total Bilirubin: 0.8 mg/dL (ref 0.3–1.2)
Total Protein: 6.5 g/dL (ref 6.5–8.1)

## 2022-01-20 MED ORDER — SODIUM CHLORIDE 0.9% FLUSH
10.0000 mL | Freq: Once | INTRAVENOUS | Status: AC
  Administered 2022-01-20: 10 mL

## 2022-01-20 MED ORDER — HEPARIN SOD (PORK) LOCK FLUSH 100 UNIT/ML IV SOLN
500.0000 [IU] | Freq: Once | INTRAVENOUS | Status: AC
  Administered 2022-01-20: 500 [IU]

## 2022-01-20 NOTE — Assessment & Plan Note (Signed)
She was recently found to have acute diverticulitis ?This was managed conservatively ?Even though I gave her prescription for antibiotics, she did not take it ?She went on clear liquid diet and laxatives ?Her symptoms has completely resolved ?We have long discussion about the importance of chronic laxative therapy to avoid constipation ?

## 2022-01-20 NOTE — Assessment & Plan Note (Signed)
The cause of her anemia is multifactorial, combination of anemia chronic illness  ?She is not bleeding and not symptomatic ?Observe only for now ?

## 2022-01-20 NOTE — Assessment & Plan Note (Signed)
Her recent CT imaging study and blood work showed no signs of cancer recurrence ?She will continue Rubraca ?I will see her in 2 months for further follow-up ?

## 2022-01-20 NOTE — Progress Notes (Signed)
[place] ?OFFICE PROGRESS NOTE ? ?Patient Care Team: ?Raina Mina., MD as PCP - General (Internal Medicine) ? 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 ?Left lower quadrant abdominal pain ?She was recently found to have acute diverticulitis ?This was managed conservatively ?Even though I gave her prescription for antibiotics, she did not take it ?She went on clear liquid diet and laxatives ?Her symptoms has completely resolved ?We have long discussion about the importance of chronic laxative therapy to avoid constipation ? ?Anemia due to antineoplastic chemotherapy ?The cause of her anemia is multifactorial, combination of anemia chronic illness  ?She is not bleeding and not symptomatic ?Observe only for now ? ?Orders Placed This Encounter  ?Procedures  ? Comprehensive metabolic panel  ?  Standing Status:   Standing  ?  Number of Occurrences:   20  ?  Standing Expiration Date:   01/21/2023  ? CBC with Differential/Platelet  ?  Standing Status:   Standing  ?  Number of Occurrences:   40  ?  Standing Expiration Date:   01/21/2023  ? CA 125  ?  Standing Status:   Standing  ?  Number of Occurrences:   11  ?  Standing Expiration Date:   01/21/2023  ? ? ?All questions were answered. The patient knows to call the clinic with any problems, questions or concerns. ?The total time spent in the appointment was 20 minutes encounter with patients including review of chart and various tests results, discussions about plan of care and coordination of care plan ?  ?Heath Lark, MD ?01/20/2022 3:48 PM ? ?INTERVAL HISTORY: ?Please see below for problem oriented charting. ?she returns for treatment follow-up on Rubraca for ovarian cancer ?She was recently seen for acute diverticulitis ?Since last time I saw her, she felt better ?She went on clear  liquid diet for few more days and then gradually introduce normal diet ?She denies recent constipation ?No fever or chills ?Her abdominal pain has resolved ? ?REVIEW OF SYSTEMS:   ?Constitutional: Denies fevers, chills or abnormal weight loss ?Eyes: Denies blurriness of vision ?Ears, nose, mouth, throat, and face: Denies mucositis or sore throat ?Respiratory: Denies cough, dyspnea or wheezes ?Cardiovascular: Denies palpitation, chest discomfort or lower extremity swelling ?Gastrointestinal:  Denies nausea, heartburn or change in bowel habits ?Skin: Denies abnormal skin rashes ?Lymphatics: Denies new lymphadenopathy or easy bruising ?Neurological:Denies numbness, tingling or new weaknesses ?Behavioral/Psych: Mood is stable, no new changes  ?All other systems were reviewed with the patient and are negative. ? ?I have reviewed the past medical history, past surgical history, social history and family history with the patient and they are unchanged from previous note. ? ?ALLERGIES:  is allergic to penicillins and rosuvastatin. ? ?MEDICATIONS:  ?Current Outpatient Medications  ?Medication Sig Dispense Refill  ? acetaminophen (TYLENOL) 500 MG tablet Take 1 tablet (500 mg total) by mouth every 6 (six) hours as needed for moderate pain. 100 tablet 2  ? ALPRAZolam (XANAX) 0.5 MG tablet Take 0.5 mg by mouth at bedtime as needed for sleep.    ? amLODipine (NORVASC) 5 MG tablet Take 5 mg by mouth daily.    ? calcium carbonate (TUMS) 500 MG chewable tablet Chew 1 tablet (200 mg of elemental calcium total) by mouth daily. 30 tablet 0  ? cholecalciferol (VITAMIN  D3) 25 MCG (1000 UNIT) tablet Take 2,000 Units by mouth daily.    ? Cyanocobalamin 1000 MCG/ML KIT Inject 1,000 mcg as directed every 14 (fourteen) days.    ? famotidine (PEPCID) 40 MG tablet Take by mouth.    ? FLUoxetine (PROZAC) 20 MG capsule Take 20 mg by mouth daily after breakfast.    ? gabapentin (NEURONTIN) 300 MG capsule Take 1 capsule (300 mg total) by mouth 2  (two) times daily. 90 capsule 2  ? glimepiride (AMARYL) 1 MG tablet Take 1 mg by mouth daily as needed. If CBG is elevated    ? hydrochlorothiazide (HYDRODIURIL) 25 MG tablet Take 25 mg by mouth daily.    ? levothyroxine (SYNTHROID) 100 MCG tablet Take 1 tablet by mouth daily.    ? lidocaine-prilocaine (EMLA) cream Apply 1 application topically as needed. 30 g 9  ? linaclotide (LINZESS) 145 MCG CAPS capsule Take 145 mcg by mouth daily before breakfast.    ? losartan (COZAAR) 100 MG tablet Take 100 mg by mouth at bedtime.     ? Melatonin 5 MG TABS Take 5-10 mg by mouth at bedtime as needed (sleep).     ? metFORMIN (GLUCOPHAGE-XR) 500 MG 24 hr tablet Take 1,000 mg by mouth 2 (two) times daily.     ? RUBRACA 300 MG tablet TAKE 1 TABLET (300 MG TOTAL) BY MOUTH 2 (TWO) TIMES DAILY. 60 tablet 11  ? traMADol (ULTRAM) 50 MG tablet Take 1 tablet (50 mg total) by mouth every 6 (six) hours as needed. 30 tablet 0  ? ?No current facility-administered medications for this visit.  ? ? 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 Patient's tumor was tested for the following markers: CA-125 ?Results of the tumor marker test revealed 11 ?  ?01/31/2018 Pathology Results  ? 1. 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 ?- TUMOR INVOLVES OVARY SURFACE AND LEFT FALLOPIAN TUBE. ?- SEE ONCOLOGY TABLE. ?2. Mesentery, small bowel mesentery biopsy #1 ?- BENIGN FIBROADIPOSE TISSUE. ?3. Ovary and fallopian tube, right ?- BENIGN OVARY WITH INCLUSION CYSTS. ?- BENIGN FALLOPIAN TUBE WITH PARATUBAL CYSTS AND ADENOFIBROMA. ?4. Omentum, resection for tumor ?- BENIGN ADIPOSE TISSUE. ?5. Peritoneum, biopsy, left diaphragmatic ?- BENIGN PERITONEAL TYPE TISSUE. ?6. Peritoneum, biopsy, right diaphragmatic ?- BENIGN PERITONEAL TYPE TISSUE. ?7. Mesentery, small  bowel mesenteric biopsy #2 ?- BENIGN FIBROADIPOSE TISSUE. ?8. Lymph nodes, regional resection, right pelvic ?- FIVE OF FIVE LYMPH NODES NEGATIVE FOR CARCINOMA (0/5). ?9. Lymph nodes, regional resection, left pelvic ?- FOUR OF FOUR LYMPH NODES NEGATIVE FOR CARCINOMA (0/4). ?10. Peritoneum, biopsy, left gutter ?- BENIGN PERITONEAL TYPE TISSUE. ?11. Peritoneum, biopsy, bladder ?- BENIGN PERITONEAL TYPE TISSUE WITH ACUTE INFLAMMATION AND CALCIFICATIONS. ? ?12. Peritoneum, biopsy, cul-de-sac ?- BENIGN PERITONEAL TYPE TISSUE WITH ACUTE INFLAMMATION AND CALCIFICATIONS. ?13. Soft tissue, biopsy, right gutter ?- BENIGN FIBROADIPOSE TISSUE. ?14. Lymph node, biopsy, right para-aortic ?- TWO OF TWO LYMPH NODES NEGATIVE FOR CARCINOMA (0/2). ?15. Lymph node, biopsy, left para-aortic ?- ONE OF ONE LYMPH NODES NEGATIVE FOR CARCINOMA (0/1). ?Microscopic Comment ?1. OVARY ?Specimen(s): Left ovary and fallopian tube. ?Procedure: (including lymph node sampling): Bilateral salpingo-oophorectomy with omental and peritoneal biopsies ?and lymph node biopsies. ?Primary tumor site (including laterality): Left ovary. ?Ovarian surface involvement: Present. ?Ovarian capsule intact without fragmentation: Intact. ?Maximum tumor size (cm): 11  cm total. ?Histologic type: Serous cystadenocarcinoma. ?Grade: High grade (low grade areas also present). ?Peritoneal implants: (specify invasive or non-invasive): N/A. ?Pelvic extension (list additional structures on separate lines and if involved): Left fallopian tube. ?Lymph nodes: number examined 12 ; number positive 0 ?TNM code: pT2a, pN0, pMX ?FIGO Stage (based on pathologic findings, needs clinical correlation): IIA ?Comments: None. ?  ?01/31/2018 Pathology Results  ? PERITONEAL WASHING (SPECIMEN 1 OF 1 COLLECTED 01/31/18): ?MALIGNANT CELLS PRESENT CONSISTENT WITH METASTATIC CARCINOMA. ?  ?01/31/2018 Surgery  ? Procedure(s) Performed:  ?1. Robotic BSO and washings. ?2. Exploratory laparotomy, Staging  including infragastric omentectomy, Bilateral pelvic and paraaortic lymphadenectomy, pertioneal biopsies. ?   ?Specimens: Bilateral tubes / ovaries, bilateral pelvic and paraaortic lymph nodes, peritoneal biops

## 2022-01-27 ENCOUNTER — Ambulatory Visit: Payer: Medicare HMO | Admitting: Hematology and Oncology

## 2022-01-27 ENCOUNTER — Other Ambulatory Visit: Payer: Medicare HMO

## 2022-01-30 ENCOUNTER — Other Ambulatory Visit (HOSPITAL_COMMUNITY): Payer: Self-pay

## 2022-01-31 ENCOUNTER — Other Ambulatory Visit (HOSPITAL_COMMUNITY): Payer: Self-pay

## 2022-02-01 ENCOUNTER — Other Ambulatory Visit: Payer: Self-pay | Admitting: Pharmacist

## 2022-02-01 ENCOUNTER — Other Ambulatory Visit (HOSPITAL_COMMUNITY): Payer: Self-pay

## 2022-02-01 ENCOUNTER — Telehealth: Payer: Self-pay | Admitting: Oncology

## 2022-02-01 NOTE — Telephone Encounter (Signed)
[name] called and said she has a UTI and was given Cipro by her primary care doctor yesterday.  She wants to make sure it is ok to continue taking Rubraca while she is taking Cipro.   ?

## 2022-02-01 NOTE — Telephone Encounter (Signed)
Called [name] and advised her that per our pharmacist's review, there are no interactions between Cipro and [place] so she is ok to continue [place]. ?

## 2022-02-13 ENCOUNTER — Other Ambulatory Visit (HOSPITAL_COMMUNITY): Payer: Self-pay

## 2022-02-21 ENCOUNTER — Other Ambulatory Visit: Payer: Self-pay | Admitting: Hematology and Oncology

## 2022-02-21 ENCOUNTER — Other Ambulatory Visit (HOSPITAL_COMMUNITY): Payer: Self-pay

## 2022-02-21 DIAGNOSIS — C562 Malignant neoplasm of left ovary: Secondary | ICD-10-CM

## 2022-02-21 MED ORDER — RUBRACA 300 MG PO TABS
ORAL_TABLET | ORAL | 11 refills | Status: DC
  Filled 2022-02-21: qty 60, 30d supply, fill #0
  Filled 2022-04-03: qty 60, 30d supply, fill #1
  Filled 2022-04-26: qty 60, 30d supply, fill #2
  Filled 2022-05-25: qty 60, 30d supply, fill #3
  Filled 2022-06-22: qty 60, 30d supply, fill #4
  Filled 2022-07-25: qty 60, 30d supply, fill #5
  Filled 2022-09-04: qty 60, 30d supply, fill #6

## 2022-02-22 ENCOUNTER — Other Ambulatory Visit (HOSPITAL_COMMUNITY): Payer: Self-pay

## 2022-02-24 ENCOUNTER — Other Ambulatory Visit (HOSPITAL_COMMUNITY): Payer: Self-pay

## 2022-02-27 ENCOUNTER — Other Ambulatory Visit (HOSPITAL_COMMUNITY): Payer: Self-pay

## 2022-03-17 ENCOUNTER — Inpatient Hospital Stay: Payer: Medicare HMO | Attending: Hematology and Oncology

## 2022-03-17 ENCOUNTER — Inpatient Hospital Stay: Payer: Medicare HMO | Admitting: Hematology and Oncology

## 2022-03-17 ENCOUNTER — Telehealth: Payer: Self-pay

## 2022-03-17 ENCOUNTER — Other Ambulatory Visit: Payer: Self-pay

## 2022-03-17 ENCOUNTER — Encounter: Payer: Self-pay | Admitting: Hematology and Oncology

## 2022-03-17 VITALS — BP 139/58 | HR 57 | Temp 97.6°F | Resp 18 | Ht 64.0 in | Wt 164.0 lb

## 2022-03-17 DIAGNOSIS — T451X5A Adverse effect of antineoplastic and immunosuppressive drugs, initial encounter: Secondary | ICD-10-CM

## 2022-03-17 DIAGNOSIS — D6481 Anemia due to antineoplastic chemotherapy: Secondary | ICD-10-CM | POA: Diagnosis not present

## 2022-03-17 DIAGNOSIS — R1032 Left lower quadrant pain: Secondary | ICD-10-CM | POA: Diagnosis not present

## 2022-03-17 DIAGNOSIS — D638 Anemia in other chronic diseases classified elsewhere: Secondary | ICD-10-CM | POA: Diagnosis not present

## 2022-03-17 DIAGNOSIS — G8929 Other chronic pain: Secondary | ICD-10-CM | POA: Diagnosis not present

## 2022-03-17 DIAGNOSIS — C562 Malignant neoplasm of left ovary: Secondary | ICD-10-CM | POA: Diagnosis present

## 2022-03-17 LAB — CBC WITH DIFFERENTIAL/PLATELET
Abs Immature Granulocytes: 0.02 10*3/uL (ref 0.00–0.07)
Basophils Absolute: 0.1 10*3/uL (ref 0.0–0.1)
Basophils Relative: 1 %
Eosinophils Absolute: 0.2 10*3/uL (ref 0.0–0.5)
Eosinophils Relative: 4 %
HCT: 33.9 % — ABNORMAL LOW (ref 36.0–46.0)
Hemoglobin: 11.6 g/dL — ABNORMAL LOW (ref 12.0–15.0)
Immature Granulocytes: 0 %
Lymphocytes Relative: 35 %
Lymphs Abs: 1.7 10*3/uL (ref 0.7–4.0)
MCH: 34.5 pg — ABNORMAL HIGH (ref 26.0–34.0)
MCHC: 34.2 g/dL (ref 30.0–36.0)
MCV: 100.9 fL — ABNORMAL HIGH (ref 80.0–100.0)
Monocytes Absolute: 0.4 10*3/uL (ref 0.1–1.0)
Monocytes Relative: 7 %
Neutro Abs: 2.7 10*3/uL (ref 1.7–7.7)
Neutrophils Relative %: 53 %
Platelets: 160 10*3/uL (ref 150–400)
RBC: 3.36 MIL/uL — ABNORMAL LOW (ref 3.87–5.11)
RDW: 14.1 % (ref 11.5–15.5)
WBC: 5.1 10*3/uL (ref 4.0–10.5)
nRBC: 0 % (ref 0.0–0.2)

## 2022-03-17 LAB — COMPREHENSIVE METABOLIC PANEL
ALT: 22 U/L (ref 0–44)
AST: 31 U/L (ref 15–41)
Albumin: 4.1 g/dL (ref 3.5–5.0)
Alkaline Phosphatase: 67 U/L (ref 38–126)
Anion gap: 6 (ref 5–15)
BUN: 26 mg/dL — ABNORMAL HIGH (ref 8–23)
CO2: 24 mmol/L (ref 22–32)
Calcium: 9.7 mg/dL (ref 8.9–10.3)
Chloride: 108 mmol/L (ref 98–111)
Creatinine, Ser: 0.83 mg/dL (ref 0.44–1.00)
GFR, Estimated: >60 mL/min (ref >60)
Glucose, Bld: 126 mg/dL — ABNORMAL HIGH (ref 70–99)
Potassium: 4.2 mmol/L (ref 3.5–5.1)
Sodium: 138 mmol/L (ref 135–145)
Total Bilirubin: 0.6 mg/dL (ref 0.3–1.2)
Total Protein: 6.4 g/dL — ABNORMAL LOW (ref 6.5–8.1)

## 2022-03-17 MED ORDER — SODIUM CHLORIDE 0.9% FLUSH
10.0000 mL | Freq: Once | INTRAVENOUS | Status: AC
  Administered 2022-03-17: 10 mL

## 2022-03-17 MED ORDER — HEPARIN SOD (PORK) LOCK FLUSH 100 UNIT/ML IV SOLN: 500.0000 [IU] | Freq: Once | INTRAVENOUS | Status: DC

## 2022-03-17 NOTE — Telephone Encounter (Signed)
Sharyn Lull please see below message. Thanks

## 2022-03-17 NOTE — Progress Notes (Signed)
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[facility]) Her recent CT imaging study and blood work showed no signs of cancer recurrence She will continue Rubraca indefinitely I would not order future CT imaging for surveillance in the absence of symptoms She needs GYN follow-up in 3 to 6 months for pelvic exam I also recommend port removal I will see her in 3 months for further follow-up  Left lower quadrant abdominal pain She has mild intermittent chronic abdominal pain She had history of diverticulitis We discussed importance of getting her bowel habits regulated Her examination is benign and she is reassured  Anemia due to antineoplastic chemotherapy The cause of her anemia is multifactorial, combination of anemia chronic illness  She is not bleeding and not symptomatic Observe only for now  Orders Placed This Encounter  Procedures   IR REMOVAL TUN ACCESS W/ PORT W/O FL MOD SED    Standing Status:   Future    Standing Expiration Date:   03/18/2023    Order Specific Question:   Reason for exam:    Answer:   no need port    Order Specific Question:   Preferred Imaging Location?    Answer:   [facility]    All questions were answered. The patient knows to call the clinic with any problems, questions or concerns. The total time spent in the appointment was 20 minutes encounter with patients including review of chart and various tests results, discussions about plan of care and coordination of care plan   Heath Lark, MD 03/17/2022 10:48 AM  INTERVAL HISTORY: Please see below for problem oriented charting. she returns for treatment follow-up on Rubraca maintenance therapy She is doing well She has mild intermittent abdominal discomfort No recent nausea or changes in bowel habits The patient denies any recent signs or symptoms of bleeding  such as spontaneous epistaxis, hematuria or hematochezia.   REVIEW OF SYSTEMS:   Constitutional: Denies fevers, chills or abnormal weight loss Eyes: Denies blurriness of vision Ears, nose, mouth, throat, and face: Denies mucositis or sore throat Respiratory: Denies cough, dyspnea or wheezes Cardiovascular: Denies palpitation, chest discomfort or lower extremity swelling Skin: Denies abnormal skin rashes Lymphatics: Denies new lymphadenopathy or easy bruising Neurological:Denies numbness, tingling or new weaknesses Behavioral/Psych: Mood is stable, no new changes  All other systems were reviewed with the patient and are negative.  I have reviewed the past medical history, past surgical history, social history and family history with the patient and they are unchanged from previous note.  ALLERGIES:  is allergic to penicillins and rosuvastatin.  MEDICATIONS:  Current Outpatient Medications  Medication Sig Dispense Refill   ALPRAZolam (XANAX) 0.5 MG tablet Take 0.5 mg by mouth at bedtime as needed for sleep.     amLODipine (NORVASC) 5 MG tablet Take 5 mg by mouth daily.     calcium carbonate (TUMS) 500 MG chewable tablet Chew 1 tablet (200 mg of elemental calcium total) by mouth daily. 30 tablet 0   cholecalciferol (VITAMIN D3) 25 MCG (1000 UNIT) tablet Take 2,000 Units by mouth daily.     Cyanocobalamin 1000 MCG/ML KIT Inject 1,000 mcg as directed every 14 (fourteen) days.     famotidine (PEPCID) 40 MG tablet Take by mouth.     FLUoxetine (PROZAC) 20 MG capsule Take 20 mg by mouth daily after breakfast.  gabapentin (NEURONTIN) 300 MG capsule Take 1 capsule (300 mg total) by mouth 2 (two) times daily. 90 capsule 2   glimepiride (AMARYL) 1 MG tablet Take 1 mg by mouth daily as needed. If CBG is elevated     hydrochlorothiazide (HYDRODIURIL) 25 MG tablet Take 25 mg by mouth daily.     levothyroxine (SYNTHROID) 100 MCG tablet Take 1 tablet by mouth daily.     lidocaine-prilocaine (EMLA)  cream Apply 1 application topically as needed. 30 g 9   linaclotide (LINZESS) 145 MCG CAPS capsule Take 145 mcg by mouth daily before breakfast.     losartan (COZAAR) 100 MG tablet Take 100 mg by mouth at bedtime.      Melatonin 5 MG TABS Take 5-10 mg by mouth at bedtime as needed (sleep).      metFORMIN (GLUCOPHAGE-XR) 500 MG 24 hr tablet Take 1,000 mg by mouth 2 (two) times daily.      RUBRACA 300 MG tablet TAKE 1 TABLET (300 MG TOTAL) BY MOUTH 2 (TWO) TIMES DAILY. 60 tablet 11   traMADol (ULTRAM) 50 MG tablet Take 1 tablet (50 mg total) by mouth every 6 (six) hours as needed. 30 tablet 0   No current facility-administered medications for this visit.    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 - TUMOR INVOLVES OVARY SURFACE AND LEFT FALLOPIAN TUBE. - SEE ONCOLOGY TABLE. 2. Mesentery, small bowel mesentery biopsy #1 - BENIGN FIBROADIPOSE TISSUE. 3. Ovary and fallopian tube, right - BENIGN OVARY WITH INCLUSION CYSTS. - BENIGN FALLOPIAN TUBE WITH PARATUBAL CYSTS AND ADENOFIBROMA. 4. Omentum, resection for tumor - BENIGN ADIPOSE TISSUE. 5. Peritoneum, biopsy, left diaphragmatic - BENIGN PERITONEAL TYPE TISSUE. 6. Peritoneum, biopsy, right diaphragmatic - BENIGN PERITONEAL TYPE TISSUE. 7. Mesentery, small bowel mesenteric biopsy #2 - BENIGN FIBROADIPOSE TISSUE. 8. Lymph nodes, regional resection, right pelvic - FIVE OF FIVE LYMPH NODES NEGATIVE FOR CARCINOMA (0/5). 9. Lymph nodes, regional resection, left pelvic - FOUR OF FOUR LYMPH NODES NEGATIVE FOR CARCINOMA (0/4). 10. Peritoneum, biopsy, left  gutter - BENIGN PERITONEAL TYPE TISSUE. 11. Peritoneum, biopsy, bladder - BENIGN PERITONEAL TYPE TISSUE WITH ACUTE INFLAMMATION AND CALCIFICATIONS.  12. Peritoneum, biopsy, cul-de-sac - BENIGN PERITONEAL TYPE TISSUE WITH ACUTE INFLAMMATION AND CALCIFICATIONS. 13. Soft tissue, biopsy, right gutter - BENIGN FIBROADIPOSE TISSUE. 14. Lymph node, biopsy, right para-aortic - TWO OF TWO LYMPH NODES NEGATIVE FOR CARCINOMA (0/2). 15. Lymph node, biopsy, left para-aortic - ONE OF ONE LYMPH NODES NEGATIVE FOR CARCINOMA (0/1). Microscopic Comment 1. OVARY Specimen(s): Left ovary and fallopian tube. Procedure: (including lymph node sampling): Bilateral salpingo-oophorectomy with omental and peritoneal biopsies and lymph node biopsies. Primary tumor site (including laterality): Left ovary. Ovarian surface involvement: Present. Ovarian capsule intact without fragmentation: Intact. Maximum tumor size (cm): 11 cm total. Histologic type: Serous cystadenocarcinoma. Grade: High grade (low grade areas also present). Peritoneal implants: (specify invasive or non-invasive): N/A. Pelvic extension (list additional structures on separate lines and if involved): Left fallopian tube. Lymph nodes: number examined 12 ; number positive 0 TNM code: pT2a, pN0, pMX FIGO Stage (based on pathologic findings, needs clinical correlation): IIA Comments:  None.    01/31/2018 Pathology Results   PERITONEAL WASHING (SPECIMEN 1 OF 1 COLLECTED 01/31/18): MALIGNANT CELLS PRESENT CONSISTENT WITH METASTATIC CARCINOMA.    01/31/2018 Surgery   Procedure(s) Performed:  1. Robotic BSO and washings. 2. Exploratory laparotomy, Staging including infragastric omentectomy, Bilateral pelvic and paraaortic lymphadenectomy, pertioneal biopsies.    Specimens: Bilateral tubes / ovaries, bilateral pelvic and paraaortic lymph nodes, peritoneal biopsies, washings and omentum.  Operative Findings: The left adnexa was adherent to the left  pelvic peritoneum suspected due to the inferior retraction from the vaginal hysterectomy. Intraoperative leakage of cystic fluid from left ovary.  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  The right adnexa grossly appeared normal although slightly enlarged for her age.  There were some indurated lymph nodes but no overtly malignant lymph nodes were suspected.  Small bowel mesentery had 3-4 superficial possible implants.  1 of these was sent for frozen section returned benign.  She did have adhesive disease from her open cholecystectomy in the right upper quadrant.  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 16 mm exophytic lesion upper pole right kidney has attenuation too high to be a simple cyst. This may be a cyst complicated by proteinaceous debris or hemorrhage, but renal cell carcinoma is a concern. Routine outpatient follow-up MRI of the abdomen without and with contrast recommended after resolution of patient's acute symptoms (so she is better able to participate with positioning and breath holding). 2. Mild edema/possible minimal hemorrhage in the extraperitoneal soft tissues of the pelvic for tracking up both pelvic sidewalls. Imaging appearance is not outside of the spectrum of findings expected 6 days after the reported surgery. Trace interloop mesenteric and free fluid is identified in the peritoneal cavity in there is some mild edema in the omentum. There is no organized or rim enhancing collection to suggest evolving abscess. 3. Gas in the  extraperitoneal soft tissues of the left lower quadrant is associated with gas in the subcutaneous fat of the lower left anterior abdominal wall. This is not unexpected on postoperative day 6. No evidence for intraperitoneal free air. 4. No CT features to suggest small bowel obstruction. Oral contrast material has migrated about halfway through the small bowel loops but there is no differential distention of proximal versus distal small bowel. The colon is diffusely distended and fluid-filled proximally, but formed stool is noted in the distal colon. Given apparent slow migration of contrast and fluid-filled small and large bowel loops, a component of ileus is possible.    02/14/2018 Imaging   MR abdomen Benign Bosniak category 2 cyst in upper pole of right kidney, corresponding with lesion seen on previous CT. No evidence of renal neoplasm or other significant abnormality.      02/18/2018 Cancer Staging   Staging form: Ovary, Fallopian Tube, and Primary Peritoneal Carcinoma, AJCC 8th Edition - Pathologic: Stage II (pT2, pN0, cM0) - Signed by Heath Lark, MD on 02/18/2018    02/28/2018 Procedure   Status post right IJ port catheter placement. 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 For last cycle, taxol was omitted    07/18/2018 Imaging   1. No evidence for residual or recurrent tumor within the abdomen or pelvis. There has been interval resolution edema and fluid within the peritoneal cavity. 2. New pulmonary nodule is identified within the right lower lobe measuring 1.1 cm, image 87/4. Consider one of the following in 3 months for both low-risk and high-risk individuals: (a) repeat chest CT, (b) follow-up PET-CT, or (c) tissue sampling. This  recommendation follows the consensus statement: Guidelines for Management of Incidental Pulmonary Nodules Detected on CT Images: From the Fleischner Society 2017; Radiology 2017; 284:228-243. 3. Stable 7 mm nodule within the right upper lobe. 4.  Aortic Atherosclerosis (ICD10-I70.0). 5. Multi vessel coronary artery atherosclerotic calcifications.    07/18/2018 Tumor Marker   Patient's tumor was tested for the following markers: CA-125 Results of the tumor marker test revealed 14.9    07/25/2018 PET scan   1. Regressing right lower lobe peripheral lung lesion, likely resolving inflammatory or atelectatic process. No hypermetabolism to suggest malignancy. I would recommend a follow-up noncontrast chest CT and 4-6 months. Small upper lobe pulmonary nodules are stable. 2. 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    07/31/2018 -  Chemotherapy   The patient started taking rucaparib    10/25/2018 Tumor Marker   Patient's tumor was tested for the following markers: CA-125 Results of the tumor marker test revealed 12.1    11/25/2018 Imaging   1. Right lower lobe nodule has resolved. Additional right lung nodules are stable. 2. Stable hazy nodularity along the left external iliac chain.  3. Hepatomegaly. 4. Aortic atherosclerosis (ICD10-170.0). Coronary artery calcification. 5. Enlarged pulmonic trunk, indicative of pulmonary arterial hypertension.    01/16/2019 Tumor Marker   Patient's tumor was tested for the following markers: CA-125 Results of the tumor marker test revealed 13.1    05/22/2019 Tumor Marker   Patient's tumor was tested for the following markers: CA-125 Results of the tumor marker test revealed 14.4   06/24/2019 Imaging   Ct chest, abdomen and pelvis 1. There are multiple clustered tiny centrilobular nodules and irregular opacities of the bilateral lower lobes and lingula,  increased compared to prior examination, with underlying bandlike scarring of the medial right middle lobe and right lung base (e.g. series 4, image 100, 110, 123). 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  2. Stable 6 mm pulmonary nodule of the right upper lobe (series 4, image 45). Attention on follow-up.   3. Status post hysterectomy and oophorectomy. No evidence of recurrent or metastatic disease in the abdomen or pelvis.   4. There are occasional sigmoid diverticula and focal fat stranding about a diverticulum of the mid sigmoid (series 2, image 103). Correlate for signs and symptoms of acute diverticulitis.   5. Other chronic, incidental, and postoperative findings as detailed above.   06/24/2019 Tumor Marker   Patient's tumor was tested for the following markers: CA-125 Results of the tumor marker test revealed 14.4.   08/19/2019 Tumor Marker   Patient's tumor was tested for the following markers: CA-125 Results of the tumor marker test revealed 10.3   09/25/2019  Imaging   CT chest 1. Interval resolution of previously demonstrated clustered centrilobular nodularity at both lung bases consistent with resolved inflammation. Stable 6 mm right upper lobe nodule consistent with a benign finding. No new or enlarging pulmonary nodules.  2. Stable chronic bronchiectasis and scarring in the right middle lobe and lingula. 3. No evidence of metastatic disease. 4. Aortic Atherosclerosis (ICD10-I70.0).   10/30/2019 Tumor Marker   Patient's tumor was tested for the following markers: CA-125 Results of the tumor marker test revealed 12.1   12/25/2019 Tumor Marker   Patient's tumor was tested for the following markers: CA-125. Results of the tumor marker test revealed 14.8.   02/19/2020 Tumor Marker   Patient's tumor was tested for the following markers: CA-125. Results of the tumor marker test revealed 15.5   04/22/2020 Tumor Marker    Patient's tumor was tested for the following markers: CA-125 Results of the tumor marker test revealed 8.9   06/16/2020 Tumor Marker   Patient's tumor was tested for the following markers: CA-125 Results of the tumor marker test revealed 12.5   06/23/2020 Imaging   1. Stable exam. No new or progressive findings to suggest recurrent or metastatic disease. 2. Aortic Atherosclerosis (ICD10-I70.0).   08/19/2020 Tumor Marker   Patient's tumor was tested for the following markers: CA-125 Results of the tumor marker test revealed 19.1   09/21/2020 Tumor Marker   Patient's tumor was tested for the following markers: CA-125 Results of the tumor marker test revealed 12.7.   11/17/2020 Tumor Marker   Patient's tumor was tested for the following markers: CA-125 Results of the tumor marker test revealed 14.7   01/11/2021 Tumor Marker   Patient's tumor was tested for the following markers: CA-125 Results of the tumor marker test revealed 11.6   02/24/2021 Tumor Marker   Patient's tumor was tested for the following markers: CA125. Results of the tumor marker test revealed 11.8   04/21/2021 Tumor Marker   Patient's tumor was tested for the following markers: CA-125. Results of the tumor marker test revealed 10.9.   06/24/2021 Tumor Marker   Patient's tumor was tested for the following markers: CA-125. Results of the tumor marker test revealed 12.9.   08/11/2021 Tumor Marker   Patient's tumor was tested for the following markers: CA-125. Results of the tumor marker test revealed 12.6.   11/30/2021 Tumor Marker   Patient's tumor was tested for the following markers: CA-125. Results of the tumor marker test revealed 13.0.   01/02/2022 Tumor Marker   Patient's tumor was tested for the following markers: CA-125. Results of the tumor marker test revealed 11.   01/05/2022 Imaging   1. Non complicated descending and sigmoid diverticulitis. 2. No evidence of metastatic disease within the abdomen or  pelvis. 3.  Aortic Atherosclerosis (ICD10-I70.0). 4. Upper pole right renal lesion demonstrates complexity, but was characterized as a complex cyst on 02/14/2018 abdominal MRI.       PHYSICAL EXAMINATION: ECOG PERFORMANCE STATUS: 1 - Symptomatic but completely ambulatory  Vitals:   03/17/22 1014  BP: (!) 139/58  Pulse: (!) 57  Resp: 18  Temp: 97.6 F (36.4 C)  SpO2: 100%   Filed Weights   03/17/22 1014  Weight: 164 lb (74.4 kg)    GENERAL:alert, no distress and comfortable SKIN: skin color, texture, turgor are normal, no rashes or significant lesions EYES: normal, Conjunctiva are pink and non-injected, sclera clear OROPHARYNX:no exudate, no erythema and lips, buccal mucosa, and tongue normal  NECK: supple, thyroid normal  size, non-tender, without nodularity LYMPH:  no palpable lymphadenopathy in the cervical, axillary or inguinal LUNGS: clear to auscultation and percussion with normal breathing effort HEART: regular rate & rhythm and no murmurs and no lower extremity edema ABDOMEN:abdomen soft, non-tender and normal bowel sounds Musculoskeletal:no cyanosis of digits and no clubbing  NEURO: alert & oriented x 3 with fluent speech, no focal motor/sensory deficits  LABORATORY DATA:  I have reviewed the data as listed    Component Value Date/Time   NA 138 03/17/2022 0954   K 4.2 03/17/2022 0954   CL 108 03/17/2022 0954   CO2 24 03/17/2022 0954   GLUCOSE 126 (H) 03/17/2022 0954   BUN 26 (H) 03/17/2022 0954   CREATININE 0.83 03/17/2022 0954   CREATININE 0.78 11/29/2021 0955   CALCIUM 9.7 03/17/2022 0954   PROT 6.4 (L) 03/17/2022 0954   ALBUMIN 4.1 03/17/2022 0954   AST 31 03/17/2022 0954   AST 31 11/29/2021 0955   ALT 22 03/17/2022 0954   ALT 28 11/29/2021 0955   ALKPHOS 67 03/17/2022 0954   BILITOT 0.6 03/17/2022 0954   BILITOT 0.5 11/29/2021 0955   GFRNONAA >60 03/17/2022 0954   GFRNONAA >60 11/29/2021 0955   GFRAA >60 06/16/2020 0918   GFRAA >60 02/13/2019  1024    No results found for: SPEP, UPEP  Lab Results  Component Value Date   WBC 5.1 03/17/2022   NEUTROABS 2.7 03/17/2022   HGB 11.6 (L) 03/17/2022   HCT 33.9 (L) 03/17/2022   MCV 100.9 (H) 03/17/2022   PLT 160 03/17/2022      Chemistry      Component Value Date/Time   NA 138 03/17/2022 0954   K 4.2 03/17/2022 0954   CL 108 03/17/2022 0954   CO2 24 03/17/2022 0954   BUN 26 (H) 03/17/2022 0954   CREATININE 0.83 03/17/2022 0954   CREATININE 0.78 11/29/2021 0955      Component Value Date/Time   CALCIUM 9.7 03/17/2022 0954   ALKPHOS 67 03/17/2022 0954   AST 31 03/17/2022 0954   AST 31 11/29/2021 0955   ALT 22 03/17/2022 0954   ALT 28 11/29/2021 0955   BILITOT 0.6 03/17/2022 0954   BILITOT 0.5 11/29/2021 0955

## 2022-03-17 NOTE — Assessment & Plan Note (Signed)
Her recent CT imaging study and blood work showed no signs of cancer recurrence She will continue Rubraca indefinitely I would not order future CT imaging for surveillance in the absence of symptoms She needs GYN follow-up in 3 to 6 months for pelvic exam I also recommend port removal I will see her in 3 months for further follow-up

## 2022-03-17 NOTE — Telephone Encounter (Signed)
Pls schedule appt to see Dr. Berline Lopes ok for next 3 months

## 2022-03-17 NOTE — Telephone Encounter (Signed)
Called her regarding her question of how often she should get pelvic exam. Exam should be every 6 months, per [name]. She does not have a GYN and would like to see Dr. Berline Lopes. She thinks her last exam was 1 1/2 years ago.

## 2022-03-17 NOTE — Assessment & Plan Note (Signed)
She has mild intermittent chronic abdominal pain She had history of diverticulitis We discussed importance of getting her bowel habits regulated Her examination is benign and she is reassured

## 2022-03-17 NOTE — Assessment & Plan Note (Signed)
The cause of her anemia is multifactorial, combination of anemia chronic illness  She is not bleeding and not symptomatic Observe only for now

## 2022-03-18 LAB — CA 125: Cancer Antigen (CA) 125: 12.5 U/mL (ref 0.0–38.1)

## 2022-03-20 ENCOUNTER — Telehealth: Payer: Self-pay

## 2022-03-20 ENCOUNTER — Other Ambulatory Visit (HOSPITAL_COMMUNITY): Payer: Self-pay

## 2022-03-20 NOTE — Telephone Encounter (Signed)
Called and left below message. Ask her to call the office back for questions. 

## 2022-03-20 NOTE — Telephone Encounter (Signed)
-----   Message from Heath Lark, MD sent at 03/20/2022  9:01 AM EDT ----- Pls call her CA-125 is normal

## 2022-03-21 ENCOUNTER — Telehealth: Payer: Self-pay | Admitting: *Deleted

## 2022-03-21 NOTE — Telephone Encounter (Signed)
Per Dr Alvy Bimler scheduled the patient to see Dr Berline Lopes on 7/13

## 2022-03-27 ENCOUNTER — Other Ambulatory Visit (HOSPITAL_COMMUNITY): Payer: Self-pay

## 2022-03-27 ENCOUNTER — Telehealth: Payer: Self-pay | Admitting: Pharmacy Technician

## 2022-03-27 NOTE — Telephone Encounter (Signed)
Oral Oncology Patient Advocate Encounter  Was successful in securing patient a $3,500 grant from [employer] to provide copayment coverage for Rubraca.  This will keep the out of pocket expense at $0.     [employer] ID: [id number]   The billing information is as follows and has been shared with [facility].    RxBin: [id number] PCN: [id number] Member ID: [id number] Group ID: [id number] Dates of Eligibility: 02/25/2022 through 02/25/2023  Fund:  [place], CPhT-Adv Pharmacy Patient Advocate Specialist [place] Patient Advocate Team Direct Number: [phone]  Fax: [phone]

## 2022-03-28 ENCOUNTER — Ambulatory Visit (HOSPITAL_COMMUNITY)
Admission: RE | Admit: 2022-03-28 | Discharge: 2022-03-28 | Disposition: A | Discharge status: Home / Self Care | Payer: Medicare HMO | Source: Ambulatory Visit | Attending: Hematology and Oncology | Admitting: Hematology and Oncology

## 2022-03-28 DIAGNOSIS — Z452 Encounter for adjustment and management of vascular access device: Secondary | ICD-10-CM | POA: Diagnosis present

## 2022-03-28 DIAGNOSIS — C562 Malignant neoplasm of left ovary: Secondary | ICD-10-CM | POA: Insufficient documentation

## 2022-03-28 HISTORY — PX: IR REMOVAL TUN ACCESS W/ PORT W/O FL MOD SED: IMG2290

## 2022-03-28 MED ORDER — LIDOCAINE HCL 1 % IJ SOLN
INTRAMUSCULAR | Status: AC
  Filled 2022-03-28: qty 20

## 2022-03-28 MED ORDER — LIDOCAINE HCL 1 % IJ SOLN
INTRAMUSCULAR | Status: DC | PRN
  Administered 2022-03-28: 10 mL via INTRADERMAL

## 2022-04-03 ENCOUNTER — Other Ambulatory Visit (HOSPITAL_COMMUNITY): Payer: Self-pay

## 2022-04-05 ENCOUNTER — Other Ambulatory Visit (HOSPITAL_COMMUNITY): Payer: Self-pay

## 2022-04-12 ENCOUNTER — Other Ambulatory Visit (HOSPITAL_COMMUNITY): Payer: Self-pay

## 2022-04-19 ENCOUNTER — Other Ambulatory Visit (HOSPITAL_COMMUNITY): Payer: Self-pay

## 2022-04-20 ENCOUNTER — Other Ambulatory Visit (HOSPITAL_COMMUNITY): Payer: Self-pay

## 2022-04-25 ENCOUNTER — Encounter: Payer: Self-pay | Admitting: Gynecologic Oncology

## 2022-04-26 ENCOUNTER — Other Ambulatory Visit (HOSPITAL_COMMUNITY): Payer: Self-pay

## 2022-04-27 ENCOUNTER — Other Ambulatory Visit: Payer: Self-pay

## 2022-04-27 ENCOUNTER — Inpatient Hospital Stay: Payer: Medicare HMO | Attending: Hematology and Oncology | Admitting: Gynecologic Oncology

## 2022-04-27 ENCOUNTER — Encounter: Payer: Self-pay | Admitting: Gynecologic Oncology

## 2022-04-27 VITALS — BP 126/58 | HR 55 | Temp 97.1°F | Resp 16 | Ht 64.0 in | Wt 160.6 lb

## 2022-04-27 DIAGNOSIS — Z90722 Acquired absence of ovaries, bilateral: Secondary | ICD-10-CM | POA: Insufficient documentation

## 2022-04-27 DIAGNOSIS — Z1501 Genetic susceptibility to malignant neoplasm of breast: Secondary | ICD-10-CM | POA: Diagnosis not present

## 2022-04-27 DIAGNOSIS — Z1509 Genetic susceptibility to other malignant neoplasm: Secondary | ICD-10-CM | POA: Diagnosis not present

## 2022-04-27 DIAGNOSIS — Z9221 Personal history of antineoplastic chemotherapy: Secondary | ICD-10-CM | POA: Diagnosis not present

## 2022-04-27 DIAGNOSIS — Z90710 Acquired absence of both cervix and uterus: Secondary | ICD-10-CM | POA: Diagnosis not present

## 2022-04-27 DIAGNOSIS — Z79899 Other long term (current) drug therapy: Secondary | ICD-10-CM

## 2022-04-27 DIAGNOSIS — Z9079 Acquired absence of other genital organ(s): Secondary | ICD-10-CM | POA: Diagnosis not present

## 2022-04-27 DIAGNOSIS — C562 Malignant neoplasm of left ovary: Secondary | ICD-10-CM

## 2022-04-27 DIAGNOSIS — Z1502 Genetic susceptibility to malignant neoplasm of ovary: Secondary | ICD-10-CM | POA: Insufficient documentation

## 2022-04-27 DIAGNOSIS — K5900 Constipation, unspecified: Secondary | ICD-10-CM | POA: Diagnosis not present

## 2022-04-27 NOTE — Progress Notes (Signed)
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 - TUMOR INVOLVES OVARY SURFACE AND LEFT FALLOPIAN TUBE. - SEE ONCOLOGY TABLE. 2. Mesentery, small bowel mesentery biopsy #1 - BENIGN FIBROADIPOSE TISSUE. 3. Ovary and fallopian tube, right - BENIGN OVARY WITH INCLUSION CYSTS. - BENIGN FALLOPIAN TUBE WITH PARATUBAL CYSTS AND ADENOFIBROMA. 4. Omentum, resection for tumor - BENIGN ADIPOSE TISSUE. 5. Peritoneum, biopsy, left diaphragmatic - BENIGN PERITONEAL TYPE TISSUE. 6. Peritoneum, biopsy, right diaphragmatic - BENIGN PERITONEAL TYPE TISSUE. 7. Mesentery, small bowel mesenteric biopsy #2 - BENIGN FIBROADIPOSE TISSUE. 8. Lymph nodes, regional resection, right pelvic - FIVE OF FIVE LYMPH NODES NEGATIVE FOR CARCINOMA (0/5). 9. Lymph nodes, regional resection, left pelvic - FOUR OF FOUR LYMPH NODES NEGATIVE FOR CARCINOMA (0/4). 10. Peritoneum, biopsy, left gutter - BENIGN PERITONEAL TYPE TISSUE. 11. Peritoneum, biopsy, bladder - BENIGN PERITONEAL TYPE TISSUE WITH ACUTE INFLAMMATION AND CALCIFICATIONS.  12. Peritoneum, biopsy, cul-de-sac - BENIGN PERITONEAL TYPE TISSUE WITH ACUTE INFLAMMATION AND CALCIFICATIONS. 13. Soft tissue, biopsy, right gutter - BENIGN FIBROADIPOSE TISSUE. 14. Lymph node, biopsy, right para-aortic - TWO OF TWO LYMPH NODES NEGATIVE FOR CARCINOMA (0/2). 15. Lymph node, biopsy, left para-aortic - ONE OF ONE LYMPH NODES NEGATIVE FOR CARCINOMA (0/1). Microscopic  Comment 1. OVARY Specimen(s): Left ovary and fallopian tube. Procedure: (including lymph node sampling): Bilateral salpingo-oophorectomy with omental and peritoneal biopsies and lymph node biopsies. Primary tumor site (including laterality): Left ovary. Ovarian surface involvement: Present. Ovarian capsule intact without fragmentation: Intact. Maximum tumor size (cm): 11 cm total. Histologic type: Serous cystadenocarcinoma. Grade: High grade (low grade areas also present). Peritoneal implants: (specify invasive or non-invasive): N/A. Pelvic extension (list additional structures on separate lines and if involved): Left fallopian tube. Lymph nodes: number examined 12 ; number positive 0 TNM code: pT2a, pN0, pMX FIGO Stage (based on pathologic findings, needs clinical correlation): IIA Comments: None.   01/31/2018 Pathology Results   PERITONEAL WASHING (SPECIMEN 1 OF 1 COLLECTED 01/31/18): MALIGNANT CELLS PRESENT CONSISTENT WITH METASTATIC CARCINOMA.   01/31/2018 Surgery   Procedure(s) Performed:  1. Robotic BSO and washings. 2. Exploratory laparotomy, Staging including infragastric omentectomy, Bilateral pelvic and paraaortic lymphadenectomy, pertioneal biopsies.    Specimens: Bilateral tubes / ovaries, bilateral pelvic and paraaortic lymph nodes, peritoneal biopsies, washings and omentum.  Operative Findings: The left adnexa was adherent to the left pelvic peritoneum suspected due to the inferior retraction from the vaginal hysterectomy. Intraoperative leakage of cystic fluid from left ovary.  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  The right adnexa grossly appeared normal although slightly enlarged for her age.  There were some indurated lymph nodes but no overtly malignant lymph nodes were suspected.  Small bowel mesentery had 3-4 superficial possible implants.  1 of these was sent for frozen section returned benign.  She did have adhesive  disease from her open cholecystectomy in the right upper quadrant.  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 16 mm exophytic lesion upper pole right kidney has attenuation too high to be a simple cyst. This may be a cyst complicated by proteinaceous debris or hemorrhage, but renal cell carcinoma is a concern. Routine outpatient follow-up MRI of the abdomen without and with contrast recommended after resolution of patient's acute symptoms (so she is better able to participate with positioning and breath holding). 2. Mild edema/possible minimal hemorrhage in the extraperitoneal soft tissues of the pelvic for tracking up both pelvic sidewalls. Imaging appearance is not outside of the spectrum of findings expected 6 days after the reported surgery. Trace interloop mesenteric and free fluid is identified in the peritoneal cavity in there is some mild edema in the omentum. There is no organized or rim enhancing collection to suggest evolving abscess. 3. Gas in the extraperitoneal soft tissues of the left lower quadrant is associated with gas in the subcutaneous fat of the lower left anterior abdominal wall. This is not unexpected on postoperative day 6. No evidence for intraperitoneal free air. 4. No CT features to suggest small bowel obstruction. Oral contrast material has migrated about halfway through the small bowel loops but there is no differential distention of proximal versus distal small bowel. The colon is diffusely distended and fluid-filled proximally, but formed stool is noted in the distal colon. Given  apparent slow migration of contrast and fluid-filled small and large bowel loops, a component of ileus is possible.   02/14/2018 Imaging   MR abdomen Benign Bosniak category 2 cyst in upper pole of right kidney, corresponding with lesion seen on previous CT. No evidence of renal neoplasm or other significant abnormality.     02/18/2018 Cancer Staging   Staging form: Ovary, Fallopian Tube, and Primary Peritoneal Carcinoma, AJCC 8th Edition - Pathologic: Stage II (pT2, pN0, cM0) - Signed by Gorsuch, Ni, MD on 02/18/2018   02/28/2018 Procedure   Status post right IJ port catheter placement. 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 For last cycle, taxol was omitted   07/18/2018 Imaging   1. No evidence for residual or recurrent tumor within the abdomen or pelvis. There has been interval resolution edema and fluid within the peritoneal cavity. 2. New pulmonary nodule is identified within the right lower lobe measuring 1.1 cm, image 87/4. Consider one of the following in 3 months for both low-risk and high-risk individuals: (a) repeat chest CT, (b) follow-up PET-CT, or (c) tissue sampling. This recommendation follows the consensus statement: Guidelines for Management of Incidental Pulmonary Nodules Detected on CT Images: From the Fleischner Society 2017; Radiology 2017; 284:228-243. 3. Stable 7 mm nodule within the right upper lobe. 4.  Aortic Atherosclerosis (ICD10-I70.0). 5. Multi vessel coronary artery atherosclerotic calcifications.   07/18/2018 Tumor Marker   Patient's tumor was tested for the following markers: CA-125 Results of the tumor marker test revealed 14.9   07/25/2018 PET scan   1. Regressing right lower lobe peripheral   lung lesion, likely  resolving inflammatory or atelectatic process. No hypermetabolism to suggest malignancy. I would recommend a follow-up noncontrast chest CT and 4-6 months. Small upper lobe pulmonary nodules are stable. 2. 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   07/31/2018 -  Chemotherapy   The patient started taking rucaparib   10/25/2018 Tumor Marker   Patient's tumor was tested for the following markers: CA-125 Results of the tumor marker test revealed 12.1   11/25/2018 Imaging   1. Right lower lobe nodule has resolved. Additional right lung nodules are stable. 2. Stable hazy nodularity along the left external iliac chain.  3. Hepatomegaly. 4. Aortic atherosclerosis (ICD10-170.0). Coronary artery calcification. 5. Enlarged pulmonic trunk, indicative of pulmonary arterial hypertension.   01/16/2019 Tumor Marker   Patient's tumor was tested for the following markers: CA-125 Results of the tumor marker test revealed 13.1   05/22/2019 Tumor Marker   Patient's tumor was tested for the following markers: CA-125 Results of the tumor marker test revealed 14.4   06/24/2019 Imaging   Ct chest, abdomen and pelvis 1. There are multiple clustered tiny centrilobular nodules and irregular opacities of the bilateral lower lobes and lingula, increased compared to prior examination, with underlying bandlike scarring of the medial right middle lobe and right lung base (e.g. series 4, image 100, 110, 123). 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  2. Stable 6 mm pulmonary nodule of the right upper lobe (series 4, image 45). Attention on follow-up.   3. Status post hysterectomy and oophorectomy. No evidence of recurrent or metastatic disease in the abdomen or pelvis.   4. There are occasional sigmoid diverticula and focal fat stranding about a diverticulum of the mid  sigmoid (series 2, image 103). Correlate for signs and symptoms of acute diverticulitis.   5. Other chronic, incidental, and postoperative findings as detailed above.   06/24/2019 Tumor Marker   Patient's tumor was tested for the following markers: CA-125 Results of the tumor marker test revealed 14.4.   08/19/2019 Tumor Marker   Patient's tumor was tested for the following markers: CA-125 Results of the tumor marker test revealed 10.3   09/25/2019 Imaging   CT chest 1. Interval resolution of previously demonstrated clustered centrilobular nodularity at both lung bases consistent with resolved inflammation. Stable 6 mm right upper lobe nodule consistent with a benign finding. No new or enlarging pulmonary nodules.  2. Stable chronic bronchiectasis and scarring in the right middle lobe and lingula. 3. No evidence of metastatic disease. 4. Aortic Atherosclerosis (ICD10-I70.0).   10/30/2019 Tumor Marker   Patient's tumor was tested for the following markers: CA-125 Results of the tumor marker test revealed 12.1   12/25/2019 Tumor Marker   Patient's tumor was tested for the following markers: CA-125. Results of the tumor marker test revealed 14.8.   02/19/2020 Tumor Marker   Patient's tumor was tested for the following markers: CA-125. Results of the tumor marker test revealed 15.5   04/22/2020 Tumor Marker   Patient's tumor was tested for the following markers: CA-125 Results of the tumor marker test revealed 8.9   06/16/2020 Tumor Marker   Patient's tumor was tested for the following markers: CA-125 Results of the tumor marker test revealed 12.5   06/23/2020 Imaging   1. Stable exam. No new or progressive findings to suggest recurrent or metastatic disease. 2. Aortic Atherosclerosis (ICD10-I70.0).   08/19/2020 Tumor Marker   Patient's   tumor was tested for the following markers: CA-125 Results of the tumor marker test revealed 19.1   09/21/2020 Tumor Marker   Patient's tumor was tested  for the following markers: CA-125 Results of the tumor marker test revealed 12.7.   11/17/2020 Tumor Marker   Patient's tumor was tested for the following markers: CA-125 Results of the tumor marker test revealed 14.7   01/11/2021 Tumor Marker   Patient's tumor was tested for the following markers: CA-125 Results of the tumor marker test revealed 11.6   02/24/2021 Tumor Marker   Patient's tumor was tested for the following markers: CA125. Results of the tumor marker test revealed 11.8   04/21/2021 Tumor Marker   Patient's tumor was tested for the following markers: CA-125. Results of the tumor marker test revealed 10.9.   06/24/2021 Tumor Marker   Patient's tumor was tested for the following markers: CA-125. Results of the tumor marker test revealed 12.9.   08/11/2021 Tumor Marker   Patient's tumor was tested for the following markers: CA-125. Results of the tumor marker test revealed 12.6.   11/30/2021 Tumor Marker   Patient's tumor was tested for the following markers: CA-125. Results of the tumor marker test revealed 13.0.   01/02/2022 Tumor Marker   Patient's tumor was tested for the following markers: CA-125. Results of the tumor marker test revealed 11.   01/05/2022 Imaging   1. Non complicated descending and sigmoid diverticulitis. 2. No evidence of metastatic disease within the abdomen or pelvis. 3.  Aortic Atherosclerosis (ICD10-I70.0). 4. Upper pole right renal lesion demonstrates complexity, but was characterized as a complex cyst on 02/14/2018 abdominal MRI.     03/20/2022 Tumor Marker   Patient's tumor was tested for the following markers: CA-125. Results of the tumor marker test revealed 12.5.   03/28/2022 Procedure   Removal of implanted Port-A-Cath utilizing sharp and blunt dissection. The procedure was uncomplicated.     Interval History: Port was removed 6/13.  Patient reports overall doing well.  Denies any vaginal bleeding or discharge.  Reports continued  issues with bowel function.  Is on Linzess, will sometimes have constipation and have only 1 or 2 bowel movements a week and at other times have liquid stool.  She has not seen GI recently.  Had an episode of pain in March, underwent a CT scan at that time and was thought to have had reticulitis.  Since that time, has had intermittent left-sided pelvic and abdominal pain.  Past Medical/Surgical History: Past Medical History:  Diagnosis Date   Arthritis    Diabetes mellitus without complication ([place])    type 2    GERD (gastroesophageal reflux disease)    Hypertension    Hypothyroidism    Ovarian cancer ([place]) 01/31/2018   Pneumonia    its been 4 years    PONV (postoperative nausea and vomiting)    Urticaria    Vitamin B 12 deficiency     Past Surgical History:  Procedure Laterality Date   BLADDER SUSPENSION  2002   BREAST SURGERY     several benign cysts removed; surgeries from [place] 1 N/A 02/09/2021   Procedure: DECOMPRESSIVE LUMBAR LAMINECTOMY LEVEL 1 lumbar three-four;  Surgeon: Melina Schools, MD;  Location: [facility];  Service: Orthopedics;  Laterality: N/A;   EYE SURGERY  2010 amd 2012   cataracts removed bilateral    IR FLUORO GUIDE PORT INSERTION RIGHT  02/28/2018   IR  REMOVAL TUN ACCESS W/ PORT W/O FL MOD SED  03/28/2022   IR US GUIDE VASC ACCESS RIGHT  02/28/2018   KNEE ARTHROSCOPY Left    l knee x2   KYPHOPLASTY N/A 02/09/2021   Procedure: KYPHOPLASTY lumbar four;  Surgeon: Brooks, Dahari, MD;  Location: [facility];  Service: Orthopedics;  Laterality: N/A;   PARATHYROIDECTOMY  2010   ROBOTIC ASSISTED SALPINGO OOPHERECTOMY Bilateral 01/31/2018   Procedure: XI ROBOTIC ASSISTED BILATERAL SALPINGO OOPHORECTOMY, STAGING, LAPAROTOMY, PELVIC AND PARA AORTIC LYMPH NODE DISSECTION, OMENTECTOMY;  Surgeon: Phelps, Shawna B, MD;  Location: [facility];  Service: Gynecology;  Laterality: Bilateral;   ROTATOR CUFF REPAIR  2005    right    TOTAL KNEE ARTHROPLASTY Left 12/08/2014   Procedure: LEFT TOTAL KNEE ARTHROPLASTY;  Surgeon: Ronald A Gioffre, MD;  Location: [facility];  Service: Orthopedics;  Laterality: Left;   VAGINAL HYSTERECTOMY  1984    Family History  Problem Relation Age of Onset   Lymphoma Mother 85   Bladder Cancer Sister 66   Prostate cancer Brother 66       has surgery right away after dx   Leukemia Brother        60's   Breast cancer Other 34       niece, GT 2017 reportedly neg   Prostate cancer Father 71       found on autopsy at death (71)   Lung cancer Maternal Aunt    Esophageal cancer Maternal Uncle    Other Maternal Grandfather 75       cerebral hemmhorage   Lung cancer Cousin    Lung cancer Cousin    Leukemia Cousin    Allergic rhinitis Neg Hx    Angioedema Neg Hx    Asthma Neg Hx    Atopy Neg Hx    Eczema Neg Hx    Immunodeficiency Neg Hx    Urticaria Neg Hx     Social History   Socioeconomic History   Marital status: Widowed    Spouse name: Not on file   Number of children: 2   Years of education: Not on file   Highest education level: Not on file  Occupational History   Occupation: retired office manager  Tobacco Use   Smoking status: Never   Smokeless tobacco: Never  Vaping Use   Vaping Use: Never used  Substance and Sexual Activity   Alcohol use: No   Drug use: No   Sexual activity: Not on file  Other Topics Concern   Not on file  Social History Narrative   Not on file   Social Determinants of Health   Financial Resource Strain: Not on file  Food Insecurity: Not on file  Transportation Needs: Not on file  Physical Activity: Not on file  Stress: Not on file  Social Connections: Not on file    Current Medications:  Current Outpatient Medications:    ALPRAZolam (XANAX) 0.5 MG tablet, Take 0.5 mg by mouth at bedtime as needed for sleep., Disp: , Rfl:    amLODipine (NORVASC) 5 MG tablet, Take 5 mg by mouth daily., Disp: , Rfl:    atorvastatin  (LIPITOR) 10 MG tablet, Take 10 mg by mouth daily., Disp: , Rfl:    calcium carbonate (TUMS) 500 MG chewable tablet, Chew 1 tablet (200 mg of elemental calcium total) by mouth daily., Disp: 30 tablet, Rfl: 0   cholecalciferol (VITAMIN D3) 25 MCG (1000 UNIT) tablet, Take 2,000 Units by mouth daily., Disp: , Rfl:      Cyanocobalamin 1000 MCG/ML KIT, Inject 1,000 mcg as directed every 14 (fourteen) days., Disp: , Rfl:    famotidine (PEPCID) 40 MG tablet, Take by mouth., Disp: , Rfl:    FLUoxetine (PROZAC) 20 MG capsule, Take 20 mg by mouth daily after breakfast., Disp: , Rfl:    gabapentin (NEURONTIN) 300 MG capsule, Take 1 capsule (300 mg total) by mouth 2 (two) times daily., Disp: 90 capsule, Rfl: 2   glimepiride (AMARYL) 1 MG tablet, Take 1 mg by mouth daily as needed. If CBG is elevated, Disp: , Rfl:    hydrochlorothiazide (HYDRODIURIL) 25 MG tablet, Take 25 mg by mouth daily., Disp: , Rfl:    levothyroxine (SYNTHROID) 100 MCG tablet, Take 1 tablet by mouth daily., Disp: , Rfl:    lidocaine-prilocaine (EMLA) cream, Apply 1 application topically as needed., Disp: 30 g, Rfl: 9   linaclotide (LINZESS) 145 MCG CAPS capsule, Take 145 mcg by mouth daily before breakfast., Disp: , Rfl:    losartan (COZAAR) 100 MG tablet, Take 100 mg by mouth at bedtime. , Disp: , Rfl:    Melatonin 5 MG TABS, Take 5-10 mg by mouth at bedtime as needed (sleep). , Disp: , Rfl:    metFORMIN (GLUCOPHAGE-XR) 500 MG 24 hr tablet, Take 1,000 mg by mouth 2 (two) times daily. , Disp: , Rfl:    RUBRACA 300 MG tablet, TAKE 1 TABLET (300 MG TOTAL) BY MOUTH 2 (TWO) TIMES DAILY., Disp: 60 tablet, Rfl: 11   traMADol (ULTRAM) 50 MG tablet, Take 1 tablet (50 mg total) by mouth every 6 (six) hours as needed., Disp: 30 tablet, Rfl: 0  Review of Systems: Denies appetite changes, fevers, chills, fatigue, unexplained weight changes. Denies hearing loss, neck lumps or masses, mouth sores, ringing in ears or voice changes. Denies cough or  wheezing.  Denies shortness of breath. Denies chest pain or palpitations. Denies leg swelling. Denies abdominal distention, pain, blood in stools, constipation, diarrhea, nausea, vomiting, or early satiety. Denies pain with intercourse, dysuria, frequency, hematuria or incontinence. Denies hot flashes, pelvic pain, vaginal bleeding or vaginal discharge.   Denies joint pain, back pain or muscle pain/cramps. Denies itching, rash, or wounds. Denies dizziness, headaches, numbness or seizures. Denies swollen lymph nodes or glands, denies easy bruising or bleeding. Denies anxiety, depression, confusion, or decreased concentration.  Physical Exam: BP (!) 126/58 (BP Location: Left Arm, Patient Position: Sitting)   Pulse (!) 55   Temp (!) 97.1 F (36.2 C) (Tympanic)   Resp 16   Ht 5' 4" (1.626 m)   Wt 160 lb 9.6 oz (72.8 kg)   SpO2 100%   BMI 27.57 kg/m  General: Alert, oriented, no acute distress. HEENT: Normocephalic, atraumatic, sclera anicteric.  No thyromegaly or masses appreciated. Chest: Clear to auscultation bilaterally.  Port site clean. Cardiovascular: Regular rate and rhythm, no murmurs. Abdomen: soft, nontender.  Normoactive bowel sounds.  No masses or hepatosplenomegaly appreciated.  Well-healed incisions. Extremities: Grossly normal range of motion.  Warm, well perfused.  No edema bilaterally. Lymphatics: No cervical, supraclavicular, or inguinal adenopathy. GU: Normal appearing external genitalia without erythema, excoriation, or lesions.  Speculum exam reveals moderately atrophic vaginal mucosa, cuff intact, no lesions or masses.  Bimanual exam reveals no masses or nodularity.  Rectovaginal exam confirms findings, feel what I suspect is a small diverticula, better appreciated on the bimanual exam.  Laboratory & Radiologic Studies: Component Ref Range & Units 1 mo ago 3 mo ago 4 mo ago 6 mo ago 8 mo ago   10 mo ago 1 yr ago  Cancer Antigen (CA) 125 0.0 - 38.1 U/mL 12.5  11.0 CM   13.0 CM  14.5 CM  12.6 CM  12.9 CM  10.6 CM    CT A/P on 01/04/22: IMPRESSION: 1. Non complicated descending and sigmoid diverticulitis. 2. No evidence of metastatic disease within the abdomen or pelvis. 3.  Aortic Atherosclerosis (ICD10-I70.0). 4. Upper pole right renal lesion demonstrates complexity, but was characterized as a complex cyst on 02/14/2018 abdominal MRI.  Assessment & Plan: [name] is a 81 y.o. 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   Patient is overall doing very well and is NED on exam.    She recently had some abdominal pain and had a CT scan at the end of March this year.  This showed findings concerning for possible early diverticulitis.  He saw her PCP for this.  Symptoms improved significantly with GI rest.  She continues to have some intermittent left sided pain.  We discussed today that this may very well be in the setting of her continued GI dysfunction and known diverticular disease.  Given otherwise lack of symptoms and normal tumor marker, my suspicion is low that this is related to cancer recurrence.  I offered that we could repeat a CT scan.  Her preference is to reach out to her gastroenterologist first.  I have ordered the CT scan and she will call my office if she like to get this scheduled.     Per NCCN surveillance recommendations, I will continue to see her every 6 months until she is 5 years out from completing treatment in about 14 months.  We discussed signs and symptoms that should prompt a phone call sooner than her next visit.  Patient continues to follow with Dr. Gorsuch.  22 minutes of total time was spent for this patient encounter, including preparation, face-to-face counseling with the patient and coordination of care, and documentation of the encounter.  Katherine Tucker, MD  Division of Gynecologic Oncology  Department of Obstetrics  and Gynecology  [facility]   

## 2022-04-27 NOTE — Patient Instructions (Addendum)
It was good to see you.  I don't see or feel any evidence of cancer on your exam.  If you decide that you would like to get a CT scan, please call and let me know.  I am going to put the order in today.  You can call the office at [phone].  Please call the office at the end of the year or in the new year to schedule a visit to see me in mid January. We will continue with visits every 6 months until you are 5 years out from finishing chemotherapy (next September).

## 2022-05-04 ENCOUNTER — Other Ambulatory Visit (HOSPITAL_COMMUNITY): Payer: Self-pay

## 2022-05-12 ENCOUNTER — Other Ambulatory Visit (HOSPITAL_COMMUNITY): Payer: Self-pay

## 2022-05-15 ENCOUNTER — Other Ambulatory Visit (HOSPITAL_COMMUNITY): Payer: Self-pay

## 2022-05-18 ENCOUNTER — Other Ambulatory Visit (HOSPITAL_COMMUNITY): Payer: Self-pay

## 2022-05-25 ENCOUNTER — Other Ambulatory Visit (HOSPITAL_COMMUNITY): Payer: Self-pay

## 2022-05-29 ENCOUNTER — Telehealth: Payer: Self-pay

## 2022-05-29 NOTE — Telephone Encounter (Signed)
Returned her call. She is complaining of left sided abdominal pain at times. When she saw Dr. Berline Lopes she advised her to see GI. She is scheduled for colonoscopy on 9/19. She has started Bentyl and benefiber daily from GI. Told her Dr. Berline Lopes put in a order for CT scan if she wanted to schedule it. She will keep appt with [name] as scheduled and call the office back for worsening symptoms.

## 2022-05-31 ENCOUNTER — Other Ambulatory Visit (HOSPITAL_COMMUNITY): Payer: Self-pay

## 2022-06-09 ENCOUNTER — Telehealth: Payer: Self-pay | Admitting: *Deleted

## 2022-06-09 ENCOUNTER — Other Ambulatory Visit: Payer: Self-pay | Admitting: Hematology and Oncology

## 2022-06-09 MED ORDER — NIRMATRELVIR/RITONAVIR (PAXLOVID)TABLET
3.0000 | ORAL_TABLET | Freq: Two times a day (BID) | ORAL | 0 refills | Status: AC
Start: 2022-06-09 — End: 2022-06-14

## 2022-06-09 NOTE — Telephone Encounter (Signed)
HOLD Rubraca I sent Paxlovid to [facility]; they may not have it. Please call, if they do not have it, she will have to pick it up at [facility] Due to [place], please reschedule her appt 3 weeks from today, do not restart Rubraca until I see her back

## 2022-06-09 NOTE — Telephone Encounter (Signed)
Pt called and stated she has tested positive for Covid.   She states primary symptom is severe fatigue and lethargy.  She denies any fevers or breathing changes.  Per your last note in June pt is on Rubraca -   Please advise for any recommendations.

## 2022-06-09 NOTE — Telephone Encounter (Signed)
Per + Covid status MD recommended :   HOLD Rubraca I sent Paxlovid to [facility]; they may not have it. Please call, if they do not have it, she will have to pick it up at [facility] Due to [place], please reschedule her appt 3 weeks from today, do not restart Rubraca until I see her back      This RN called pt to discuss with pt verbalizing " I will hold the Rubraca".  [facility] to verify if Paxlovid available- they do not carry and recommended for medication to be called to "one of the chains".  Contacted pt per above- she [facility] [address]  This RN called them and verified medication is in stock- gave verbal order - note [facility] [place] per multiple attempts.

## 2022-06-12 ENCOUNTER — Telehealth: Payer: Self-pay

## 2022-06-12 NOTE — Telephone Encounter (Signed)
Returned her call. She misread the covid test and she is negative. She repeated the test with her daughter and was negative. [facility]. She has restarted the [place] [facility] today and never started the Paxlovid. Moved her appts back to 9/5 and she is aware of appts.  FYI

## 2022-06-20 ENCOUNTER — Encounter: Payer: Self-pay | Admitting: Hematology and Oncology

## 2022-06-20 ENCOUNTER — Inpatient Hospital Stay: Payer: Medicare HMO

## 2022-06-20 ENCOUNTER — Inpatient Hospital Stay: Payer: Medicare HMO | Attending: Hematology and Oncology

## 2022-06-20 ENCOUNTER — Inpatient Hospital Stay: Payer: Medicare HMO | Admitting: Hematology and Oncology

## 2022-06-20 ENCOUNTER — Other Ambulatory Visit: Payer: Self-pay

## 2022-06-20 DIAGNOSIS — C562 Malignant neoplasm of left ovary: Secondary | ICD-10-CM | POA: Insufficient documentation

## 2022-06-20 DIAGNOSIS — K5909 Other constipation: Secondary | ICD-10-CM | POA: Insufficient documentation

## 2022-06-20 LAB — COMPREHENSIVE METABOLIC PANEL
ALT: 21 U/L (ref 0–44)
AST: 25 U/L (ref 15–41)
Albumin: 4.4 g/dL (ref 3.5–5.0)
Alkaline Phosphatase: 61 U/L (ref 38–126)
Anion gap: 5 (ref 5–15)
BUN: 25 mg/dL — ABNORMAL HIGH (ref 8–23)
CO2: 29 mmol/L (ref 22–32)
Calcium: 10 mg/dL (ref 8.9–10.3)
Chloride: 103 mmol/L (ref 98–111)
Creatinine, Ser: 0.95 mg/dL (ref 0.44–1.00)
GFR, Estimated: >60 mL/min (ref >60)
Glucose, Bld: 121 mg/dL — ABNORMAL HIGH (ref 70–99)
Potassium: 4.8 mmol/L (ref 3.5–5.1)
Sodium: 137 mmol/L (ref 135–145)
Total Bilirubin: 0.7 mg/dL (ref 0.3–1.2)
Total Protein: 6.8 g/dL (ref 6.5–8.1)

## 2022-06-20 LAB — CBC WITH DIFFERENTIAL/PLATELET
Abs Immature Granulocytes: 0.06 10*3/uL (ref 0.00–0.07)
Basophils Absolute: 0.1 10*3/uL (ref 0.0–0.1)
Basophils Relative: 1 %
Eosinophils Absolute: 0.1 10*3/uL (ref 0.0–0.5)
Eosinophils Relative: 1 %
HCT: 36.1 % (ref 36.0–46.0)
Hemoglobin: 12.5 g/dL (ref 12.0–15.0)
Immature Granulocytes: 1 %
Lymphocytes Relative: 27 %
Lymphs Abs: 2 10*3/uL (ref 0.7–4.0)
MCH: 35 pg — ABNORMAL HIGH (ref 26.0–34.0)
MCHC: 34.6 g/dL (ref 30.0–36.0)
MCV: 101.1 fL — ABNORMAL HIGH (ref 80.0–100.0)
Monocytes Absolute: 0.5 10*3/uL (ref 0.1–1.0)
Monocytes Relative: 6 %
Neutro Abs: 4.7 10*3/uL (ref 1.7–7.7)
Neutrophils Relative %: 64 %
Platelets: 185 10*3/uL (ref 150–400)
RBC: 3.57 MIL/uL — ABNORMAL LOW (ref 3.87–5.11)
RDW: 14.5 % (ref 11.5–15.5)
WBC: 7.4 10*3/uL (ref 4.0–10.5)
nRBC: 0 % (ref 0.0–0.2)

## 2022-06-20 NOTE — Assessment & Plan Note (Signed)
Her recent CT imaging study in March 2023 and blood work showed no signs of cancer recurrence She will continue Rubraca indefinitely I would not order future CT imaging for surveillance in the absence of symptoms She needs GYN follow-up in 3 to 6 months for pelvic exam I will see her in 3 months for further follow-up

## 2022-06-20 NOTE — Assessment & Plan Note (Signed)
She has chronic constipation Recommend gentle laxatives rather than more fiber in her diet She has appointment to follow-up with GI

## 2022-06-20 NOTE — Progress Notes (Signed)
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) Her recent CT imaging study in March 2023 and blood work showed no signs of cancer recurrence She will continue Rubraca indefinitely I would not order future CT imaging for surveillance in the absence of symptoms She needs GYN follow-up in 3 to 6 months for pelvic exam I will see her in 3 months for further follow-up  Other constipation She has chronic constipation Recommend gentle laxatives rather than more fiber in her diet She has appointment to follow-up with GI  No orders of the defined types were placed in this encounter.   All questions were answered. The patient knows to call the clinic with any problems, questions or concerns. The total time spent in the appointment was 20 minutes encounter with patients including review of chart and various tests results, discussions about plan of care and coordination of care plan   Heath Lark, MD 06/20/2022 11:01 AM  INTERVAL HISTORY: Please see below for problem oriented charting. she returns for treatment follow-up on maintenance treatment with rucaparib for history of ovarian cancer She is doing well She has chronic constipation, stable She is dependent on Linzess to get regular bowel movement Denies recent abdominal pain, nausea or bloating She thought she had COVID infection recently but upon further testing, she was confirmed to be negative  REVIEW OF SYSTEMS:   Constitutional: Denies fevers, chills or abnormal weight loss Eyes: Denies blurriness of vision Ears, nose, mouth, throat, and face: Denies mucositis or sore throat Respiratory: Denies cough, dyspnea or wheezes Cardiovascular: Denies palpitation, chest discomfort or lower extremity swelling Gastrointestinal:  Denies nausea, heartburn or change in bowel habits Skin: Denies abnormal skin  rashes Lymphatics: Denies new lymphadenopathy or easy bruising Neurological:Denies numbness, tingling or new weaknesses Behavioral/Psych: Mood is stable, no new changes  All other systems were reviewed with the patient and are negative.  I have reviewed the past medical history, past surgical history, social history and family history with the patient and they are unchanged from previous note.  ALLERGIES:  is allergic to penicillins and rosuvastatin.  MEDICATIONS:  Current Outpatient Medications  Medication Sig Dispense Refill   ALPRAZolam (XANAX) 0.5 MG tablet Take 0.5 mg by mouth at bedtime as needed for sleep.     amLODipine (NORVASC) 5 MG tablet Take 5 mg by mouth daily.     atorvastatin (LIPITOR) 10 MG tablet Take 10 mg by mouth daily.     calcium carbonate (TUMS) 500 MG chewable tablet Chew 1 tablet (200 mg of elemental calcium total) by mouth daily. 30 tablet 0   cholecalciferol (VITAMIN D3) 25 MCG (1000 UNIT) tablet Take 2,000 Units by mouth daily.     Cyanocobalamin 1000 MCG/ML KIT Inject 1,000 mcg as directed every 14 (fourteen) days.     famotidine (PEPCID) 40 MG tablet Take by mouth.     FLUoxetine (PROZAC) 20 MG capsule Take 20 mg by mouth daily after breakfast.     gabapentin (NEURONTIN) 300 MG capsule Take 1 capsule (300 mg total) by mouth 2 (two) times daily. 90 capsule 2   glimepiride (AMARYL) 1 MG tablet Take 1 mg by mouth daily as needed. If CBG is elevated     hydrochlorothiazide (HYDRODIURIL) 25 MG tablet Take 25 mg by mouth daily.     levothyroxine (SYNTHROID) 100 MCG tablet Take 1 tablet by mouth daily.  lidocaine-prilocaine (EMLA) cream Apply 1 application topically as needed. 30 g 9   linaclotide (LINZESS) 145 MCG CAPS capsule Take 145 mcg by mouth daily before breakfast.     losartan (COZAAR) 100 MG tablet Take 100 mg by mouth at bedtime.      Melatonin 5 MG TABS Take 5-10 mg by mouth at bedtime as needed (sleep).      metFORMIN (GLUCOPHAGE-XR) 500 MG 24 hr  tablet Take 1,000 mg by mouth 2 (two) times daily.      RUBRACA 300 MG tablet TAKE 1 TABLET (300 MG TOTAL) BY MOUTH 2 (TWO) TIMES DAILY. 60 tablet 11   traMADol (ULTRAM) 50 MG tablet Take 1 tablet (50 mg total) by mouth every 6 (six) hours as needed. 30 tablet 0   No current facility-administered medications for this visit.    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)  01/09/2018 Tumor Marker   Patient's tumor was tested for the following markers: CA-125 Results of the tumor marker test revealed 11   01/31/2018 Pathology Results   1. 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 - TUMOR INVOLVES OVARY SURFACE AND LEFT FALLOPIAN TUBE. - SEE ONCOLOGY TABLE. 2. Mesentery, small bowel mesentery biopsy #1 - BENIGN FIBROADIPOSE TISSUE. 3. Ovary and fallopian tube, right - BENIGN OVARY WITH INCLUSION CYSTS. - BENIGN FALLOPIAN TUBE WITH PARATUBAL CYSTS AND ADENOFIBROMA. 4. Omentum, resection for tumor - BENIGN ADIPOSE TISSUE. 5. Peritoneum, biopsy, left diaphragmatic - BENIGN PERITONEAL TYPE TISSUE. 6. Peritoneum, biopsy, right diaphragmatic - BENIGN PERITONEAL TYPE TISSUE. 7. Mesentery, small bowel mesenteric biopsy #2 - BENIGN FIBROADIPOSE TISSUE. 8. Lymph nodes, regional resection, right pelvic - FIVE OF FIVE LYMPH NODES NEGATIVE FOR CARCINOMA (0/5). 9. Lymph nodes, regional resection, left pelvic - FOUR OF FOUR LYMPH NODES NEGATIVE FOR CARCINOMA (0/4). 10. Peritoneum, biopsy, left gutter - BENIGN PERITONEAL TYPE TISSUE. 11. Peritoneum, biopsy, bladder - BENIGN PERITONEAL TYPE TISSUE WITH ACUTE INFLAMMATION AND CALCIFICATIONS.  12. Peritoneum, biopsy, cul-de-sac - BENIGN PERITONEAL TYPE TISSUE WITH ACUTE INFLAMMATION AND CALCIFICATIONS. 13. Soft tissue, biopsy, right gutter - BENIGN FIBROADIPOSE TISSUE. 14. Lymph  node, biopsy, right para-aortic - TWO OF TWO LYMPH NODES NEGATIVE FOR CARCINOMA (0/2). 15. Lymph node, biopsy, left para-aortic - ONE OF ONE LYMPH NODES NEGATIVE FOR CARCINOMA (0/1). Microscopic Comment 1. OVARY Specimen(s): Left ovary and fallopian tube. Procedure: (including lymph node sampling): Bilateral salpingo-oophorectomy with omental and peritoneal biopsies and lymph node biopsies. Primary tumor site (including laterality): Left ovary. Ovarian surface involvement: Present. Ovarian capsule intact without fragmentation: Intact. Maximum tumor size (cm): 11 cm total. Histologic type: Serous cystadenocarcinoma. Grade: High grade (low grade areas also present). Peritoneal implants: (specify invasive or non-invasive): N/A. Pelvic extension (list additional structures on separate lines and if involved): Left fallopian tube. Lymph nodes: number examined 12 ; number positive 0 TNM code: pT2a, pN0, pMX FIGO Stage (based on pathologic findings, needs clinical correlation): IIA Comments: None.   01/31/2018 Pathology Results   PERITONEAL WASHING (SPECIMEN 1 OF 1 COLLECTED 01/31/18): MALIGNANT CELLS PRESENT CONSISTENT WITH METASTATIC CARCINOMA.   01/31/2018 Surgery   Procedure(s) Performed:  1. Robotic BSO and washings. 2. Exploratory laparotomy, Staging including infragastric omentectomy, Bilateral pelvic and paraaortic lymphadenectomy, pertioneal biopsies.    Specimens: Bilateral tubes / ovaries, bilateral pelvic and paraaortic lymph nodes, peritoneal biopsies, washings and omentum.  Operative Findings: The left  adnexa was adherent to the left pelvic peritoneum suspected due to the inferior retraction from the vaginal hysterectomy. Intraoperative leakage of cystic fluid from left ovary.  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  The right adnexa grossly appeared normal although slightly enlarged for her age.  There were some indurated lymph  nodes but no overtly malignant lymph nodes were suspected.  Small bowel mesentery had 3-4 superficial possible implants.  1 of these was sent for frozen section returned benign.  She did have adhesive disease from her open cholecystectomy in the right upper quadrant.  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 16 mm exophytic lesion upper pole right kidney has attenuation too high to be a simple cyst. This may be a cyst complicated by proteinaceous debris or hemorrhage, but renal cell carcinoma is a concern. Routine outpatient follow-up MRI of the abdomen without and with contrast recommended after resolution of patient's acute symptoms (so she is better able to participate with positioning and breath holding). 2. Mild edema/possible minimal hemorrhage in the extraperitoneal soft tissues of the pelvic for tracking up both pelvic sidewalls. Imaging appearance is not outside of the spectrum of findings expected 6 days after the reported surgery. Trace interloop mesenteric and free fluid is identified in the peritoneal cavity in there is some mild edema in the omentum. There is no organized or rim enhancing collection to suggest evolving abscess. 3. Gas in the extraperitoneal soft tissues of the left lower quadrant is associated with gas in the subcutaneous fat of the lower left anterior abdominal wall. This is not unexpected on postoperative day 6. No evidence for intraperitoneal free air. 4. No CT features to suggest small bowel obstruction. Oral contrast material has migrated about halfway through the small  bowel loops but there is no differential distention of proximal versus distal small bowel. The colon is diffusely distended and fluid-filled proximally, but formed stool is noted in the distal colon. Given apparent slow migration of contrast and fluid-filled small and large bowel loops, a component of ileus is possible.   02/14/2018 Imaging   MR abdomen Benign Bosniak category 2 cyst in upper pole of right kidney, corresponding with lesion seen on previous CT. No evidence of renal neoplasm or other significant abnormality.     02/18/2018 Cancer Staging   Staging form: Ovary, Fallopian Tube, and Primary Peritoneal Carcinoma, AJCC 8th Edition - Pathologic: Stage II (pT2, pN0, cM0) - Signed by Heath Lark, MD on 02/18/2018   02/28/2018 Procedure   Status post right IJ port catheter placement. 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 For last cycle, taxol was omitted   07/18/2018 Imaging   1. No evidence for residual or recurrent tumor within the abdomen or pelvis. There has been interval resolution edema and fluid within the peritoneal cavity. 2.  New pulmonary nodule is identified within the right lower lobe measuring 1.1 cm, image 87/4. Consider one of the following in 3 months for both low-risk and high-risk individuals: (a) repeat chest CT, (b) follow-up PET-CT, or (c) tissue sampling. This recommendation follows the consensus statement: Guidelines for Management of Incidental Pulmonary Nodules Detected on CT Images: From the Fleischner Society 2017; Radiology 2017; 284:228-243. 3. Stable 7 mm nodule within the right upper lobe. 4.  Aortic Atherosclerosis (ICD10-I70.0). 5. Multi vessel coronary artery atherosclerotic calcifications.   07/18/2018 Tumor Marker    Patient's tumor was tested for the following markers: CA-125 Results of the tumor marker test revealed 14.9   07/25/2018 PET scan   1. Regressing right lower lobe peripheral lung lesion, likely resolving inflammatory or atelectatic process. No hypermetabolism to suggest malignancy. I would recommend a follow-up noncontrast chest CT and 4-6 months. Small upper lobe pulmonary nodules are stable. 2. 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   07/31/2018 -  Chemotherapy   The patient started taking rucaparib   10/25/2018 Tumor Marker   Patient's tumor was tested for the following markers: CA-125 Results of the tumor marker test revealed 12.1   11/25/2018 Imaging   1. Right lower lobe nodule has resolved. Additional right lung nodules are stable. 2. Stable hazy nodularity along the left external iliac chain.  3. Hepatomegaly. 4. Aortic atherosclerosis (ICD10-170.0). Coronary artery calcification. 5. Enlarged pulmonic trunk, indicative of pulmonary arterial hypertension.   01/16/2019 Tumor Marker   Patient's tumor was tested for the following markers: CA-125 Results of the tumor marker test revealed 13.1   05/22/2019 Tumor Marker   Patient's tumor was tested for the following markers: CA-125 Results of the tumor marker test revealed 14.4   06/24/2019 Imaging   Ct chest, abdomen and pelvis 1. There are multiple clustered tiny centrilobular nodules and irregular opacities of the bilateral lower lobes and lingula, increased compared to prior examination, with underlying bandlike scarring of the medial right middle lobe and right lung base (e.g. series 4, image 100, 110, 123). 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  2. Stable 6 mm pulmonary nodule of the right upper lobe (series 4, image 45). Attention on follow-up.   3. Status post hysterectomy  and oophorectomy. No evidence of recurrent or metastatic disease in the abdomen or pelvis.   4. There are occasional sigmoid diverticula and focal fat stranding about a diverticulum of the mid sigmoid (series 2, image 103). Correlate for signs and symptoms of acute diverticulitis.   5. Other chronic, incidental, and postoperative findings as detailed above.   06/24/2019 Tumor Marker   Patient's tumor was tested for the following markers: CA-125 Results of the tumor marker test revealed 14.4.   08/19/2019 Tumor Marker   Patient's tumor was tested for the following markers: CA-125 Results of the tumor marker test revealed 10.3   09/25/2019 Imaging   CT chest 1. Interval resolution of previously demonstrated clustered centrilobular nodularity at both lung bases consistent with resolved inflammation. Stable 6 mm right upper lobe nodule consistent with a benign finding. No new or enlarging pulmonary nodules.  2. Stable chronic bronchiectasis and scarring in the right middle lobe and lingula. 3. No evidence of metastatic disease. 4. Aortic Atherosclerosis (ICD10-I70.0).   10/30/2019 Tumor Marker   Patient's tumor was tested for the following markers: CA-125 Results of the tumor marker test revealed 12.1   12/25/2019 Tumor  Marker   Patient's tumor was tested for the following markers: CA-125. Results of the tumor marker test revealed 14.8.   02/19/2020 Tumor Marker   Patient's tumor was tested for the following markers: CA-125. Results of the tumor marker test revealed 15.5   04/22/2020 Tumor Marker   Patient's tumor was tested for the following markers: CA-125 Results of the tumor marker test revealed 8.9   06/16/2020 Tumor Marker   Patient's tumor was tested for the following markers: CA-125 Results of the tumor marker test revealed 12.5   06/23/2020 Imaging   1. Stable exam. No new or progressive findings to suggest recurrent or metastatic disease. 2. Aortic Atherosclerosis (ICD10-I70.0).    08/19/2020 Tumor Marker   Patient's tumor was tested for the following markers: CA-125 Results of the tumor marker test revealed 19.1   09/21/2020 Tumor Marker   Patient's tumor was tested for the following markers: CA-125 Results of the tumor marker test revealed 12.7.   11/17/2020 Tumor Marker   Patient's tumor was tested for the following markers: CA-125 Results of the tumor marker test revealed 14.7   01/11/2021 Tumor Marker   Patient's tumor was tested for the following markers: CA-125 Results of the tumor marker test revealed 11.6   02/24/2021 Tumor Marker   Patient's tumor was tested for the following markers: CA125. Results of the tumor marker test revealed 11.8   04/21/2021 Tumor Marker   Patient's tumor was tested for the following markers: CA-125. Results of the tumor marker test revealed 10.9.   06/24/2021 Tumor Marker   Patient's tumor was tested for the following markers: CA-125. Results of the tumor marker test revealed 12.9.   08/11/2021 Tumor Marker   Patient's tumor was tested for the following markers: CA-125. Results of the tumor marker test revealed 12.6.   11/30/2021 Tumor Marker   Patient's tumor was tested for the following markers: CA-125. Results of the tumor marker test revealed 13.0.   01/02/2022 Tumor Marker   Patient's tumor was tested for the following markers: CA-125. Results of the tumor marker test revealed 11.   01/05/2022 Imaging   1. Non complicated descending and sigmoid diverticulitis. 2. No evidence of metastatic disease within the abdomen or pelvis. 3.  Aortic Atherosclerosis (ICD10-I70.0). 4. Upper pole right renal lesion demonstrates complexity, but was characterized as a complex cyst on 02/14/2018 abdominal MRI.     03/20/2022 Tumor Marker   Patient's tumor was tested for the following markers: CA-125. Results of the tumor marker test revealed 12.5.   03/28/2022 Procedure   Removal of implanted Port-A-Cath utilizing sharp and blunt  dissection. The procedure was uncomplicated.     PHYSICAL EXAMINATION: ECOG PERFORMANCE STATUS: 0 - Asymptomatic  Vitals:   06/20/22 1046  BP: (!) 171/81  Pulse: (!) 56  Resp: 15  Temp: 98 F (36.7 C)  SpO2: 100%   Filed Weights   06/20/22 1046  Weight: 160 lb (72.6 kg)    GENERAL:alert, no distress and comfortable SKIN: skin color, texture, turgor are normal, no rashes or significant lesions EYES: normal, Conjunctiva are pink and non-injected, sclera clear OROPHARYNX:no exudate, no erythema and lips, buccal mucosa, and tongue normal  NECK: supple, thyroid normal size, non-tender, without nodularity LYMPH:  no palpable lymphadenopathy in the cervical, axillary or inguinal LUNGS: clear to auscultation and percussion with normal breathing effort HEART: regular rate & rhythm and no murmurs and no lower extremity edema ABDOMEN:abdomen soft, non-tender and normal bowel sounds Musculoskeletal:no cyanosis of digits and no clubbing  NEURO: alert & oriented x 3 with fluent speech, no focal motor/sensory deficits  LABORATORY DATA:  I have reviewed the data as listed    Component Value Date/Time   NA 137 06/20/2022 1006   K 4.8 06/20/2022 1006   CL 103 06/20/2022 1006   CO2 29 06/20/2022 1006   GLUCOSE 121 (H) 06/20/2022 1006   BUN 25 (H) 06/20/2022 1006   CREATININE 0.95 06/20/2022 1006   CREATININE 0.78 11/29/2021 0955   CALCIUM 10.0 06/20/2022 1006   PROT 6.8 06/20/2022 1006   ALBUMIN 4.4 06/20/2022 1006   AST 25 06/20/2022 1006   AST 31 11/29/2021 0955   ALT 21 06/20/2022 1006   ALT 28 11/29/2021 0955   ALKPHOS 61 06/20/2022 1006   BILITOT 0.7 06/20/2022 1006   BILITOT 0.5 11/29/2021 0955   GFRNONAA >60 06/20/2022 1006   GFRNONAA >60 11/29/2021 0955   GFRAA >60 06/16/2020 0918   GFRAA >60 02/13/2019 1024    No results found for: "SPEP", "UPEP"  Lab Results  Component Value Date   WBC 7.4 06/20/2022   NEUTROABS 4.7 06/20/2022   HGB 12.5 06/20/2022   HCT  36.1 06/20/2022   MCV 101.1 (H) 06/20/2022   PLT 185 06/20/2022      Chemistry      Component Value Date/Time   NA 137 06/20/2022 1006   K 4.8 06/20/2022 1006   CL 103 06/20/2022 1006   CO2 29 06/20/2022 1006   BUN 25 (H) 06/20/2022 1006   CREATININE 0.95 06/20/2022 1006   CREATININE 0.78 11/29/2021 0955      Component Value Date/Time   CALCIUM 10.0 06/20/2022 1006   ALKPHOS 61 06/20/2022 1006   AST 25 06/20/2022 1006   AST 31 11/29/2021 0955   ALT 21 06/20/2022 1006   ALT 28 11/29/2021 0955   BILITOT 0.7 06/20/2022 1006   BILITOT 0.5 11/29/2021 0955

## 2022-06-21 LAB — CA 125: Cancer Antigen (CA) 125: 12.9 U/mL (ref 0.0–38.1)

## 2022-06-22 ENCOUNTER — Other Ambulatory Visit (HOSPITAL_COMMUNITY): Payer: Self-pay

## 2022-06-22 ENCOUNTER — Telehealth: Payer: Self-pay

## 2022-06-22 NOTE — Telephone Encounter (Signed)
Called and given below message. She verbalized understanding. 

## 2022-06-22 NOTE — Telephone Encounter (Signed)
-----   Message from Heath Lark, MD sent at 06/22/2022  8:42 AM EDT ----- Pls let her know CA-125 is nromal

## 2022-06-29 ENCOUNTER — Ambulatory Visit: Payer: Medicare HMO | Admitting: Hematology and Oncology

## 2022-06-29 ENCOUNTER — Other Ambulatory Visit: Payer: Medicare HMO

## 2022-07-06 ENCOUNTER — Other Ambulatory Visit (HOSPITAL_COMMUNITY): Payer: Self-pay

## 2022-07-17 ENCOUNTER — Other Ambulatory Visit (HOSPITAL_COMMUNITY): Payer: Self-pay

## 2022-07-17 ENCOUNTER — Encounter: Payer: Self-pay | Admitting: Allergy and Immunology

## 2022-07-17 ENCOUNTER — Ambulatory Visit: Payer: Medicare HMO | Admitting: Allergy and Immunology

## 2022-07-17 VITALS — BP 110/62 | HR 60 | Resp 16 | Ht 64.0 in | Wt 163.6 lb

## 2022-07-17 DIAGNOSIS — L299 Pruritus, unspecified: Secondary | ICD-10-CM

## 2022-07-17 MED ORDER — MONTELUKAST SODIUM 10 MG PO TABS: 10.0000 mg | ORAL_TABLET | Freq: Every day | ORAL | 5 refills | Status: DC

## 2022-07-17 NOTE — Progress Notes (Signed)
 [name] [place] [name]   Follow-up Note  Referring Provider: Grisso, Greg A., MD Primary Provider: Grisso, Greg A., MD Date of Office Visit: 07/17/2022  Subjective:   [name] (DOB: 02/01/1941) is a 81 y.o. 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  I last saw her in this clinic for her initial evaluation 09 Mar 2021.  During her last evaluation she had a pruritic disorder that fortunately appeared to resolve over the course of 2 months with therapy and she did quite well up until about 2 months ago.  At that point time she once again developed very bad pruritic disorder without any dermatitis and without any dermatographia and without any associated systemic or constitutional symptoms and without an obvious provoking factor that is disturbing her sleep.  He does not appear to be responding to Claritin and Pepcid.  She was given prednisone 5 days ago which did not help her at all and made her sleep a little dysfunctional and raised her blood sugar.  She has not started any new medications, supplements, health foods, energy boosters, she has not had a change in her environments, she does not have symptoms of an ongoing infectious disease.  All of her medical problems are stable and have been stable for many months.  She had a colonoscopy last month which was normal other than diverticulosis.  She had an unremarkable abdominal CT scan March 2023.  Allergies as of 07/17/2022       Reactions   Penicillins Anaphylaxis   Has patient had a PCN reaction causing immediate rash, facial/tongue/throat swelling, SOB or lightheadedness with hypotension: Yes Has patient had a PCN reaction causing severe rash involving mucus membranes or skin necrosis: No Has patient had a PCN reaction that required hospitalization: Yes Has patient had a  PCN reaction occurring within the last 10 years: No If all of the above answers are "NO", then may proceed with Cephalosporin use.   Rosuvastatin Itching   Possible. Possible.        Medication List    ALPRAZolam 0.5 MG tablet Commonly known as: XANAX Take 0.5 mg by mouth at bedtime as needed for sleep.   amLODipine 5 MG tablet Commonly known as: NORVASC Take 5 mg by mouth daily.   atorvastatin 10 MG tablet Commonly known as: LIPITOR Take 10 mg by mouth daily.   calcium carbonate 500 MG chewable tablet Commonly known as: Tums Chew 1 tablet (200 mg of elemental calcium total) by mouth daily.   cholecalciferol 25 MCG (1000 UNIT) tablet Commonly known as: VITAMIN D3 Take 2,000 Units by mouth daily.   Cyanocobalamin 1000 MCG/ML Kit Inject 1,000 mcg as directed every 14 (fourteen) days.   famotidine 40 MG tablet Commonly known as: PEPCID Take by mouth.   FLUoxetine 20 MG capsule Commonly known as: PROZAC Take 20 mg by mouth daily after breakfast.   gabapentin 300 MG capsule Commonly known as: NEURONTIN Take 1 capsule (300 mg total) by mouth 2 (two) times daily.   glimepiride 1 MG tablet Commonly known as: AMARYL Take 1 mg by mouth daily as needed. If CBG is elevated   hydrochlorothiazide 25 MG tablet Commonly known as: HYDRODIURIL Take 25 mg by mouth daily.   levothyroxine 100 MCG tablet Commonly known as: SYNTHROID Take 1 tablet by mouth daily.   lidocaine-prilocaine cream   Commonly known as: EMLA Apply 1 application topically as needed.   linaclotide 145 MCG Caps capsule Commonly known as: LINZESS Take 145 mcg by mouth daily before breakfast.   losartan 100 MG tablet Commonly known as: COZAAR Take 100 mg by mouth at bedtime.   melatonin 5 MG Tabs Take 5-10 mg by mouth at bedtime as needed (sleep).   metFORMIN 500 MG 24 hr tablet Commonly known as: GLUCOPHAGE-XR Take 1,000 mg by mouth 2 (two) times daily.   Rubraca 300 MG tablet Generic drug:  rucaparib camsylate TAKE 1 TABLET (300 MG TOTAL) BY MOUTH 2 (TWO) TIMES DAILY.   traMADol 50 MG tablet Commonly known as: ULTRAM Take 1 tablet (50 mg total) by mouth every 6 (six) hours as needed.    Past Medical History:  Diagnosis Date   Arthritis    Diabetes mellitus without complication (HCC)    type 2    GERD (gastroesophageal reflux disease)    Hypertension    Hypothyroidism    Ovarian cancer (HCC) 01/31/2018   Pneumonia    its been 4 years    PONV (postoperative nausea and vomiting)    Urticaria    Vitamin B 12 deficiency     Past Surgical History:  Procedure Laterality Date   BLADDER SUSPENSION  2002   BREAST SURGERY     several benign cysts removed; surgeries from 1975- 1990    CHOLECYSTECTOMY  1988   DECOMPRESSIVE LUMBAR LAMINECTOMY LEVEL 1 N/A 02/09/2021   Procedure: DECOMPRESSIVE LUMBAR LAMINECTOMY LEVEL 1 lumbar three-four;  Surgeon: Brooks, Dahari, MD;  Location: [facility] OR;  Service: Orthopedics;  Laterality: N/A;   EYE SURGERY  2010 amd 2012   cataracts removed bilateral    IR FLUORO GUIDE PORT INSERTION RIGHT  02/28/2018   IR REMOVAL TUN ACCESS W/ PORT W/O FL MOD SED  03/28/2022   IR US GUIDE VASC ACCESS RIGHT  02/28/2018   KNEE ARTHROSCOPY Left    l knee x2   KYPHOPLASTY N/A 02/09/2021   Procedure: KYPHOPLASTY lumbar four;  Surgeon: Brooks, Dahari, MD;  Location: [facility] OR;  Service: Orthopedics;  Laterality: N/A;   PARATHYROIDECTOMY  2010   ROBOTIC ASSISTED SALPINGO OOPHERECTOMY Bilateral 01/31/2018   Procedure: XI ROBOTIC ASSISTED BILATERAL SALPINGO OOPHORECTOMY, STAGING, LAPAROTOMY, PELVIC AND PARA AORTIC LYMPH NODE DISSECTION, OMENTECTOMY;  Surgeon: Phelps, Shawna B, MD;  Location: [facility];  Service: Gynecology;  Laterality: Bilateral;   ROTATOR CUFF REPAIR  2005   right    TOTAL KNEE ARTHROPLASTY Left 12/08/2014   Procedure: LEFT TOTAL KNEE ARTHROPLASTY;  Surgeon: Ronald A Gioffre, MD;  Location: [facility];  Service: Orthopedics;  Laterality: Left;   VAGINAL  HYSTERECTOMY  1984    Review of systems negative except as noted in HPI / PMHx or noted below:  Review of Systems  Constitutional: Negative.   HENT: Negative.    Eyes: Negative.   Respiratory: Negative.    Cardiovascular: Negative.   Gastrointestinal: Negative.   Genitourinary: Negative.   Musculoskeletal: Negative.   Skin: Negative.   Neurological: Negative.   Endo/Heme/Allergies: Negative.   Psychiatric/Behavioral: Negative.       Objective:   Vitals:   07/17/22 1544  BP: 110/62  Pulse: 60  Resp: 16  SpO2: 96%   Height: 5' 4" (162.6 cm)  Weight: 163 lb 9.6 oz (74.2 kg)   Physical Exam Constitutional:      Appearance: She is not diaphoretic.  HENT:     Head: Normocephalic.     Right Ear:   Tympanic membrane, ear canal and external ear normal.     Left Ear: Tympanic membrane, ear canal and external ear normal.     Nose: Nose normal. No mucosal edema or rhinorrhea.     Mouth/Throat:     Pharynx: Uvula midline. No oropharyngeal exudate.  Eyes:     Conjunctiva/sclera: Conjunctivae normal.  Neck:     Thyroid: No thyromegaly.     Trachea: Trachea normal. No tracheal tenderness or tracheal deviation.  Cardiovascular:     Rate and Rhythm: Normal rate and regular rhythm.     Heart sounds: Normal heart sounds, S1 normal and S2 normal. No murmur heard. Pulmonary:     Effort: No respiratory distress.     Breath sounds: Normal breath sounds. No stridor. No wheezing or rales.  Lymphadenopathy:     Head:     Right side of head: No tonsillar adenopathy.     Left side of head: No tonsillar adenopathy.     Cervical: No cervical adenopathy.  Skin:    Findings: No erythema or rash.     Nails: There is no clubbing.  Neurological:     Mental Status: She is alert.     Diagnostics:    Results of blood tests obtained 20 June 2022 identifies creatinine 0.95 MGs/DL, AST 25 U/L, ALT 21 U/L, WBC 7.4, eosinophil 100, lymphocyte 2000, hemoglobin 12.5, platelet 185.  Results  of blood tests obtained 09 Mar 2021 identifies negative alpha gal panel, thyroid peroxidase antibody 11/mL  Results of an abdominal CT scan obtained 04 January 2022 identified the following:  1. Non complicated descending and sigmoid diverticulitis. 2. No evidence of metastatic disease within the abdomen or pelvis. 3.  Aortic Atherosclerosis (ICD10-I70.0). 4. Upper pole right renal lesion demonstrates complexity, but was characterized as a complex cyst on 02/14/2018 abdominal MRI.  Assessment and Plan:   1. Pruritic disorder    1. Cetirizine 10 mg - 1-2 tablets 1-2 times per day (MAX=75m/day)  2.  Famotidine 20 mg -1 tablet 2 times a day  3.  Montelukast 10 mg -1 tablet 1 time per day  4.  Discontinue prednisone  5.  Arrange for fall flu vaccine and RSV vaccine  6.  Further evaluation and treatment?  Call clinic with update next week  AJosslynnwill utilize an H1 and H2 receptor blocker and a leukotriene modifier to calm down her immune activation.  We will get her up to 40 mg a day of cetirizine.  We will have her discontinue her prednisone as this is not helping and is just providing side effects.  We will make a determination about further evaluation and treatment based upon her response to this approach over the next week.  EAllena Katz MD [facility] / [facility]

## 2022-07-17 NOTE — Patient Instructions (Addendum)
  1. Cetirizine 10 mg - 1-2 tablets 1-2 times per day (MAX='40mg'$ /day)  2.  Famotidine 20 mg -1 tablet 2 times a day  3.  Montelukast 10 mg -1 tablet 1 time per day  4.  Discontinue prednisone  5.  Arrange for fall flu vaccine and RSV vaccine  6.  Further evaluation and treatment?  Call clinic with update next week

## 2022-07-18 ENCOUNTER — Other Ambulatory Visit (HOSPITAL_COMMUNITY): Payer: Self-pay

## 2022-07-18 ENCOUNTER — Encounter: Payer: Self-pay | Admitting: Allergy and Immunology

## 2022-07-18 ENCOUNTER — Telehealth: Payer: Self-pay | Admitting: Pharmacy Technician

## 2022-07-18 NOTE — Telephone Encounter (Signed)
Oral Oncology Patient Advocate Encounter   Was successful in placing patient on grant waitlist for PANF - Rubraca  I have spoken with the patient and made them aware of the process.  Waitlist ID: [id number]  Fund name:  Ovarian [employer].  Lady Deutscher, CPhT-Adv Oncology Pharmacy Patient [place] Direct Number: [phone]  Fax: [phone]

## 2022-07-24 ENCOUNTER — Telehealth: Payer: Self-pay | Admitting: *Deleted

## 2022-07-24 NOTE — Telephone Encounter (Signed)
Paysen called with an update to let you know that the medication you provided did help with her itching but later in the week she was cutting down some vines and she thinks it was poison. She has broken out on her arms where she carried the vines. She is asking if you can treat this or if she should go somewhere. Please advise.

## 2022-07-25 ENCOUNTER — Other Ambulatory Visit: Payer: Self-pay | Admitting: *Deleted

## 2022-07-25 ENCOUNTER — Other Ambulatory Visit (HOSPITAL_COMMUNITY): Payer: Self-pay

## 2022-07-25 MED ORDER — HALOBETASOL PROPIONATE 0.05 % EX OINT: TOPICAL_OINTMENT | CUTANEOUS | 0 refills | Status: DC

## 2022-07-25 NOTE — Telephone Encounter (Signed)
Informed of all instructions and rx for Ultravate sent.

## 2022-07-26 ENCOUNTER — Other Ambulatory Visit: Payer: Self-pay | Admitting: *Deleted

## 2022-07-26 MED ORDER — TRIAMCINOLONE ACETONIDE 0.1 % EX OINT: TOPICAL_OINTMENT | CUTANEOUS | 0 refills | Status: DC

## 2022-07-31 ENCOUNTER — Other Ambulatory Visit (HOSPITAL_COMMUNITY): Payer: Self-pay

## 2022-08-07 ENCOUNTER — Telehealth: Payer: Self-pay

## 2022-08-07 NOTE — Telephone Encounter (Signed)
Returned her call. She recently took prednisone for a rash and kenalog injection for poison ivy. She has had recent bruising to arms. She thinks that the recent bruising is from the injection and prednisone. Reassured her and told her to call the office back if earlier appt needed. She verbalized understanding.

## 2022-08-14 ENCOUNTER — Telehealth: Payer: Self-pay

## 2022-08-14 ENCOUNTER — Inpatient Hospital Stay: Payer: Medicare HMO | Attending: Hematology and Oncology

## 2022-08-14 ENCOUNTER — Other Ambulatory Visit: Payer: Self-pay

## 2022-08-14 DIAGNOSIS — C562 Malignant neoplasm of left ovary: Secondary | ICD-10-CM

## 2022-08-14 DIAGNOSIS — Z8543 Personal history of malignant neoplasm of ovary: Secondary | ICD-10-CM | POA: Insufficient documentation

## 2022-08-14 LAB — COMPREHENSIVE METABOLIC PANEL
ALT: 17 U/L (ref 0–44)
AST: 18 U/L (ref 15–41)
Albumin: 3.9 g/dL (ref 3.5–5.0)
Alkaline Phosphatase: 54 U/L (ref 38–126)
Anion gap: 6 (ref 5–15)
BUN: 22 mg/dL (ref 8–23)
CO2: 25 mmol/L (ref 22–32)
Calcium: 9.3 mg/dL (ref 8.9–10.3)
Chloride: 109 mmol/L (ref 98–111)
Creatinine, Ser: 1.16 mg/dL — ABNORMAL HIGH (ref 0.44–1.00)
GFR, Estimated: 47 mL/min — ABNORMAL LOW (ref >60)
Glucose, Bld: 112 mg/dL — ABNORMAL HIGH (ref 70–99)
Potassium: 4.3 mmol/L (ref 3.5–5.1)
Sodium: 140 mmol/L (ref 135–145)
Total Bilirubin: 0.6 mg/dL (ref 0.3–1.2)
Total Protein: 6.5 g/dL (ref 6.5–8.1)

## 2022-08-14 LAB — CBC WITH DIFFERENTIAL/PLATELET
Abs Immature Granulocytes: 0.04 10*3/uL (ref 0.00–0.07)
Basophils Absolute: 0.1 10*3/uL (ref 0.0–0.1)
Basophils Relative: 1 %
Eosinophils Absolute: 0.1 10*3/uL (ref 0.0–0.5)
Eosinophils Relative: 1 %
HCT: 34.7 % — ABNORMAL LOW (ref 36.0–46.0)
Hemoglobin: 11.9 g/dL — ABNORMAL LOW (ref 12.0–15.0)
Immature Granulocytes: 1 %
Lymphocytes Relative: 27 %
Lymphs Abs: 1.9 10*3/uL (ref 0.7–4.0)
MCH: 35.6 pg — ABNORMAL HIGH (ref 26.0–34.0)
MCHC: 34.3 g/dL (ref 30.0–36.0)
MCV: 103.9 fL — ABNORMAL HIGH (ref 80.0–100.0)
Monocytes Absolute: 0.5 10*3/uL (ref 0.1–1.0)
Monocytes Relative: 7 %
Neutro Abs: 4.4 10*3/uL (ref 1.7–7.7)
Neutrophils Relative %: 63 %
Platelets: 207 10*3/uL (ref 150–400)
RBC: 3.34 MIL/uL — ABNORMAL LOW (ref 3.87–5.11)
RDW: 14.7 % (ref 11.5–15.5)
WBC: 7 10*3/uL (ref 4.0–10.5)
nRBC: 0 % (ref 0.0–0.2)

## 2022-08-14 NOTE — Telephone Encounter (Signed)
She showed up in the lobby concerned about bruising to arms and legs. Recently on Prednisone for poison ivy and took a kenalog injection.  Added a lab appt for today and she is aware of appt.

## 2022-08-16 ENCOUNTER — Telehealth: Payer: Self-pay | Admitting: *Deleted

## 2022-08-16 LAB — CA 125: Cancer Antigen (CA) 125: 13.6 U/mL (ref 0.0–38.1)

## 2022-08-16 NOTE — Telephone Encounter (Addendum)
-----   Message from Heath Lark, MD sent at 08/16/2022  8:16 AM EDT ----- pls call her and let her know CA-125 is normal

## 2022-08-16 NOTE — Telephone Encounter (Signed)
Contacted patient per [name] message below. Patient verbalized understanding and expressed appreciation for call.

## 2022-08-16 NOTE — Telephone Encounter (Addendum)
Opened in error

## 2022-08-17 ENCOUNTER — Telehealth: Payer: Self-pay

## 2022-08-17 NOTE — Telephone Encounter (Signed)
PA request received from [facility] through [facility] for Halobetasol Propionate 0.05% ointment  PA has been submitted and is awaiting determination.   KeyRhina Brackett - PA Case ID: [id number]

## 2022-08-17 NOTE — Telephone Encounter (Signed)
PA for Halobetasol Propionate 0.05% ointment has been DENIED due to:   you may not have tried are: mometasone topical ointment, triamcinolone acetonide 0.025 % topical ointment, triamcinolone acetonide 0.5 % topical ointment, betamethasone dipropionate topical ointment, betamethasone, augmented topical ointment AND fluocinolone topical ointment. Your provider needs to give [place] medical reasons why the preferred drug(s) would not work for you and/or would have bad side effects.  Please advise.

## 2022-08-21 MED ORDER — BETAMETHASONE VALERATE 0.1 % EX OINT: 1.0000 | TOPICAL_OINTMENT | Freq: Two times a day (BID) | CUTANEOUS | 0 refills | Status: DC

## 2022-08-21 NOTE — Telephone Encounter (Signed)
Sent in betamethasone ointment 0.1%

## 2022-08-21 NOTE — Telephone Encounter (Signed)
Pa denied please advise to change in therapy too: mometasone topical ointment, triamcinolone acetonide 0.025 % topical ointment, triamcinolone acetonide 0.5 % topical ointment, betamethasone dipropionate topical ointment, betamethasone, augmented topical ointment AND fluocinolone topical ointment.

## 2022-08-21 NOTE — Addendum Note (Signed)
Addended by: Felipa Emory on: 08/21/2022 02:25 PM   Modules accepted: Orders

## 2022-08-22 ENCOUNTER — Other Ambulatory Visit (HOSPITAL_COMMUNITY): Payer: Self-pay

## 2022-08-31 ENCOUNTER — Other Ambulatory Visit (HOSPITAL_COMMUNITY): Payer: Self-pay

## 2022-09-04 ENCOUNTER — Other Ambulatory Visit (HOSPITAL_COMMUNITY): Payer: Self-pay

## 2022-09-19 ENCOUNTER — Other Ambulatory Visit: Payer: Self-pay

## 2022-09-19 ENCOUNTER — Inpatient Hospital Stay: Payer: Medicare HMO | Attending: Hematology and Oncology

## 2022-09-19 ENCOUNTER — Other Ambulatory Visit (HOSPITAL_COMMUNITY): Payer: Self-pay

## 2022-09-19 ENCOUNTER — Encounter: Payer: Self-pay | Admitting: Hematology and Oncology

## 2022-09-19 ENCOUNTER — Inpatient Hospital Stay: Payer: Medicare HMO | Admitting: Hematology and Oncology

## 2022-09-19 VITALS — BP 144/59 | HR 64 | Resp 18 | Ht 64.0 in | Wt 166.4 lb

## 2022-09-19 DIAGNOSIS — K5909 Other constipation: Secondary | ICD-10-CM

## 2022-09-19 DIAGNOSIS — R197 Diarrhea, unspecified: Secondary | ICD-10-CM | POA: Diagnosis not present

## 2022-09-19 DIAGNOSIS — Z8543 Personal history of malignant neoplasm of ovary: Secondary | ICD-10-CM | POA: Insufficient documentation

## 2022-09-19 DIAGNOSIS — C562 Malignant neoplasm of left ovary: Secondary | ICD-10-CM

## 2022-09-19 LAB — CBC WITH DIFFERENTIAL/PLATELET
Abs Immature Granulocytes: 0.04 10*3/uL (ref 0.00–0.07)
Basophils Absolute: 0.1 10*3/uL (ref 0.0–0.1)
Basophils Relative: 1 %
Eosinophils Absolute: 0.1 10*3/uL (ref 0.0–0.5)
Eosinophils Relative: 2 %
HCT: 36.7 % (ref 36.0–46.0)
Hemoglobin: 12.5 g/dL (ref 12.0–15.0)
Immature Granulocytes: 1 %
Lymphocytes Relative: 30 %
Lymphs Abs: 1.8 10*3/uL (ref 0.7–4.0)
MCH: 35.5 pg — ABNORMAL HIGH (ref 26.0–34.0)
MCHC: 34.1 g/dL (ref 30.0–36.0)
MCV: 104.3 fL — ABNORMAL HIGH (ref 80.0–100.0)
Monocytes Absolute: 0.4 10*3/uL (ref 0.1–1.0)
Monocytes Relative: 6 %
Neutro Abs: 3.6 10*3/uL (ref 1.7–7.7)
Neutrophils Relative %: 60 %
Platelets: 175 10*3/uL (ref 150–400)
RBC: 3.52 MIL/uL — ABNORMAL LOW (ref 3.87–5.11)
RDW: 13.9 % (ref 11.5–15.5)
WBC: 6 10*3/uL (ref 4.0–10.5)
nRBC: 0 % (ref 0.0–0.2)

## 2022-09-19 LAB — COMPREHENSIVE METABOLIC PANEL
ALT: 24 U/L (ref 0–44)
AST: 29 U/L (ref 15–41)
Albumin: 4.1 g/dL (ref 3.5–5.0)
Alkaline Phosphatase: 58 U/L (ref 38–126)
Anion gap: 6 (ref 5–15)
BUN: 22 mg/dL (ref 8–23)
CO2: 28 mmol/L (ref 22–32)
Calcium: 9.8 mg/dL (ref 8.9–10.3)
Chloride: 106 mmol/L (ref 98–111)
Creatinine, Ser: 1 mg/dL (ref 0.44–1.00)
GFR, Estimated: 57 mL/min — ABNORMAL LOW (ref >60)
Glucose, Bld: 132 mg/dL — ABNORMAL HIGH (ref 70–99)
Potassium: 4.6 mmol/L (ref 3.5–5.1)
Sodium: 140 mmol/L (ref 135–145)
Total Bilirubin: 0.7 mg/dL (ref 0.3–1.2)
Total Protein: 6.7 g/dL (ref 6.5–8.1)

## 2022-09-19 NOTE — Progress Notes (Signed)
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[facility]) Her recent CT imaging study in March 2023 and blood work showed no signs of cancer recurrence We discussed risk and benefits of discontinuation of Rubraca and she is in agreement She was stopped once her current prescription runs out I will see her in 3 months for further follow-up and repeat imaging study  Other constipation She has chronic constipation Recommend gentle laxatives and avoid taking Gas-X   Orders Placed This Encounter  Procedures   CT ABDOMEN PELVIS W CONTRAST    Standing Status:   Future    Standing Expiration Date:   09/20/2023    Order Specific Question:   If indicated for the ordered procedure, I authorize the administration of contrast media per Radiology protocol    Answer:   Yes    Order Specific Question:   Preferred imaging location?    Answer:   [facility]    Order Specific Question:   Radiology Contrast Protocol - do NOT remove file path    Answer:   [email or web address]    All questions were answered. The patient knows to call the clinic with any problems, questions or concerns. The total time spent in the appointment was 30 minutes encounter with patients including review of chart and various tests results, discussions about plan of care and coordination of care plan   Heath Lark, MD 09/19/2022 10:38 AM  INTERVAL HISTORY: Please see below for problem oriented charting. she returns for treatment follow-up on Rubraca She continues to have intermittent bouts of constipation and diarrhea This is not new We discussed laxative management Otherwise, she feels fine Denies recent infection  REVIEW OF SYSTEMS:   Constitutional: Denies fevers, chills or abnormal weight loss Eyes: Denies blurriness of vision Ears, nose, mouth,  throat, and face: Denies mucositis or sore throat Respiratory: Denies cough, dyspnea or wheezes Cardiovascular: Denies palpitation, chest discomfort or lower extremity swelling Skin: Denies abnormal skin rashes Lymphatics: Denies new lymphadenopathy or easy bruising Neurological:Denies numbness, tingling or new weaknesses Behavioral/Psych: Mood is stable, no new changes  All other systems were reviewed with the patient and are negative.  I have reviewed the past medical history, past surgical history, social history and family history with the patient and they are unchanged from previous note.  ALLERGIES:  is allergic to penicillins and rosuvastatin.  MEDICATIONS:  Current Outpatient Medications  Medication Sig Dispense Refill   ALPRAZolam (XANAX) 0.5 MG tablet Take 0.5 mg by mouth at bedtime as needed for sleep.     amLODipine (NORVASC) 5 MG tablet Take 5 mg by mouth daily.     atorvastatin (LIPITOR) 10 MG tablet Take 10 mg by mouth daily.     calcium carbonate (TUMS) 500 MG chewable tablet Chew 1 tablet (200 mg of elemental calcium total) by mouth daily. 30 tablet 0   cholecalciferol (VITAMIN D3) 25 MCG (1000 UNIT) tablet Take 2,000 Units by mouth daily.     Cyanocobalamin 1000 MCG/ML KIT Inject 1,000 mcg as directed every 14 (fourteen) days.     famotidine (PEPCID) 40 MG tablet Take by mouth.     FLUoxetine (PROZAC) 20 MG capsule Take 20 mg by mouth daily after breakfast.     halobetasol (ULTRAVATE) 0.05 % ointment Apply to affected areas as directed twice daily until clear 50 g 0   hydrochlorothiazide (HYDRODIURIL) 25  MG tablet Take 25 mg by mouth daily.     levothyroxine (SYNTHROID) 100 MCG tablet Take 1 tablet by mouth daily.     linaclotide (LINZESS) 145 MCG CAPS capsule Take 145 mcg by mouth daily before breakfast.     losartan (COZAAR) 100 MG tablet Take 100 mg by mouth at bedtime.      Melatonin 5 MG TABS Take 5-10 mg by mouth at bedtime as needed (sleep).      metFORMIN  (GLUCOPHAGE-XR) 500 MG 24 hr tablet Take 1,000 mg by mouth 2 (two) times daily.      montelukast (SINGULAIR) 10 MG tablet Take 1 tablet (10 mg total) by mouth at bedtime. 30 tablet 5   triamcinolone ointment (KENALOG) 0.1 % Apply to affected areas 4 times daily until clear 80 g 0   No current facility-administered medications for this visit.    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 - TUMOR INVOLVES OVARY SURFACE AND LEFT FALLOPIAN TUBE. - SEE ONCOLOGY TABLE. 2. Mesentery, small bowel mesentery biopsy #1 - BENIGN FIBROADIPOSE TISSUE. 3. Ovary and fallopian tube, right - BENIGN OVARY WITH INCLUSION CYSTS. - BENIGN FALLOPIAN TUBE WITH PARATUBAL CYSTS AND ADENOFIBROMA. 4. Omentum, resection for tumor - BENIGN ADIPOSE TISSUE. 5. Peritoneum, biopsy, left diaphragmatic - BENIGN PERITONEAL TYPE TISSUE. 6. Peritoneum, biopsy, right diaphragmatic - BENIGN PERITONEAL TYPE TISSUE. 7. Mesentery, small bowel mesenteric biopsy #2 - BENIGN FIBROADIPOSE TISSUE. 8. Lymph nodes, regional resection, right pelvic - FIVE OF FIVE LYMPH NODES NEGATIVE FOR CARCINOMA (0/5). 9. Lymph nodes, regional resection, left pelvic - FOUR OF FOUR LYMPH NODES NEGATIVE FOR CARCINOMA (0/4). 10. Peritoneum, biopsy, left gutter - BENIGN PERITONEAL TYPE TISSUE. 11. Peritoneum, biopsy, bladder - BENIGN PERITONEAL TYPE TISSUE WITH ACUTE INFLAMMATION AND CALCIFICATIONS.  12. Peritoneum, biopsy, cul-de-sac - BENIGN PERITONEAL TYPE TISSUE WITH ACUTE INFLAMMATION AND CALCIFICATIONS. 13. Soft tissue, biopsy, right gutter - BENIGN FIBROADIPOSE  TISSUE. 14. Lymph node, biopsy, right para-aortic - TWO OF TWO LYMPH NODES NEGATIVE FOR CARCINOMA (0/2). 15. Lymph node, biopsy, left para-aortic - ONE OF ONE LYMPH NODES NEGATIVE FOR CARCINOMA (0/1). Microscopic Comment 1. OVARY Specimen(s): Left ovary and fallopian tube. Procedure: (including lymph node sampling): Bilateral salpingo-oophorectomy with omental and peritoneal biopsies and lymph node biopsies. Primary tumor site (including laterality): Left ovary. Ovarian surface involvement: Present. Ovarian capsule intact without fragmentation: Intact. Maximum tumor size (cm): 11 cm total. Histologic type: Serous cystadenocarcinoma. Grade: High grade (low grade areas also present). Peritoneal implants: (specify invasive or non-invasive): N/A. Pelvic extension (list additional structures on separate lines and if involved): Left fallopian tube. Lymph nodes: number examined 12 ; number positive 0 TNM code: pT2a, pN0, pMX FIGO Stage (based on pathologic findings, needs clinical correlation): IIA Comments: None.   01/31/2018 Pathology Results   PERITONEAL WASHING (SPECIMEN 1 OF 1 COLLECTED 01/31/18): MALIGNANT CELLS PRESENT CONSISTENT WITH METASTATIC CARCINOMA.   01/31/2018 Surgery   Procedure(s) Performed:  1. Robotic BSO and washings. 2. Exploratory laparotomy, Staging including infragastric omentectomy, Bilateral pelvic and paraaortic lymphadenectomy, pertioneal biopsies.    Specimens: Bilateral tubes / ovaries, bilateral pelvic and paraaortic lymph nodes, peritoneal biopsies, washings  and omentum.  Operative Findings: The left adnexa was adherent to the left pelvic peritoneum suspected due to the inferior retraction from the vaginal hysterectomy. Intraoperative leakage of cystic fluid from left ovary.  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  The right adnexa grossly appeared normal although slightly enlarged for her age.  There were some  indurated lymph nodes but no overtly malignant lymph nodes were suspected.  Small bowel mesentery had 3-4 superficial possible implants.  1 of these was sent for frozen section returned benign.  She did have adhesive disease from her open cholecystectomy in the right upper quadrant.  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 16 mm exophytic lesion upper pole right kidney has attenuation too high to be a simple cyst. This may be a cyst complicated by proteinaceous debris or hemorrhage, but renal cell carcinoma is a concern. Routine outpatient follow-up MRI of the abdomen without and with contrast recommended after resolution of patient's acute symptoms (so she is better able to participate with positioning and breath holding). 2. Mild edema/possible minimal hemorrhage in the extraperitoneal soft tissues of the pelvic for tracking up both pelvic sidewalls. Imaging appearance is not outside of the spectrum of findings expected 6 days after the reported surgery. Trace interloop mesenteric and free fluid is identified in the peritoneal cavity in there is some mild edema in the omentum. There is no organized or rim enhancing collection to suggest evolving abscess. 3. Gas in the extraperitoneal soft tissues of the left lower quadrant is associated with gas in the subcutaneous fat of the lower left anterior abdominal wall. This is not unexpected on postoperative day 6. No evidence for intraperitoneal free air. 4. No CT features to suggest small bowel obstruction. Oral contrast material has migrated about halfway  through the small bowel loops but there is no differential distention of proximal versus distal small bowel. The colon is diffusely distended and fluid-filled proximally, but formed stool is noted in the distal colon. Given apparent slow migration of contrast and fluid-filled small and large bowel loops, a component of ileus is possible.   02/14/2018 Imaging   MR abdomen Benign Bosniak category 2 cyst in upper pole of right kidney, corresponding with lesion seen on previous CT. No evidence of renal neoplasm or other significant abnormality.     02/18/2018 Cancer Staging   Staging form: Ovary, Fallopian Tube, and Primary Peritoneal Carcinoma, AJCC 8th Edition - Pathologic: Stage II (pT2, pN0, cM0) - Signed by Heath Lark, MD on 02/18/2018   02/28/2018 Procedure   Status post right IJ port catheter placement. 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 For last cycle, taxol was omitted   07/18/2018 Imaging   1. No evidence for residual or recurrent tumor within the abdomen or pelvis. There has been interval resolution edema  and fluid within the peritoneal cavity. 2. New pulmonary nodule is identified within the right lower lobe measuring 1.1 cm, image 87/4. Consider one of the following in 3 months for both low-risk and high-risk individuals: (a) repeat chest CT, (b) follow-up PET-CT, or (c) tissue sampling. This recommendation follows the consensus statement: Guidelines for Management of Incidental Pulmonary Nodules Detected on CT Images: From the Fleischner Society 2017; Radiology 2017; 284:228-243. 3. Stable 7 mm nodule within the right upper lobe. 4.  Aortic Atherosclerosis (ICD10-I70.0). 5. Multi vessel coronary artery atherosclerotic calcifications.    07/18/2018 Tumor Marker   Patient's tumor was tested for the following markers: CA-125 Results of the tumor marker test revealed 14.9   07/25/2018 PET scan   1. Regressing right lower lobe peripheral lung lesion, likely resolving inflammatory or atelectatic process. No hypermetabolism to suggest malignancy. I would recommend a follow-up noncontrast chest CT and 4-6 months. Small upper lobe pulmonary nodules are stable. 2. 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   07/31/2018 -  Chemotherapy   The patient started taking rucaparib   10/25/2018 Tumor Marker   Patient's tumor was tested for the following markers: CA-125 Results of the tumor marker test revealed 12.1   11/25/2018 Imaging   1. Right lower lobe nodule has resolved. Additional right lung nodules are stable. 2. Stable hazy nodularity along the left external iliac chain.  3. Hepatomegaly. 4. Aortic atherosclerosis (ICD10-170.0). Coronary artery calcification. 5. Enlarged pulmonic trunk, indicative of pulmonary arterial hypertension.   01/16/2019 Tumor Marker   Patient's tumor was tested for the following markers: CA-125 Results of the tumor marker test revealed 13.1   05/22/2019 Tumor Marker   Patient's tumor was tested for the following markers: CA-125 Results of the tumor marker test revealed 14.4   06/24/2019 Imaging   Ct chest, abdomen and pelvis 1. There are multiple clustered tiny centrilobular nodules and irregular opacities of the bilateral lower lobes and lingula, increased compared to prior examination, with underlying bandlike scarring of the medial right middle lobe and right lung base (e.g. series 4, image 100, 110, 123). 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  2. Stable 6 mm pulmonary nodule of the right upper lobe (series 4, image 45). Attention on follow-up.   3.  Status post hysterectomy and oophorectomy. No evidence of recurrent or metastatic disease in the abdomen or pelvis.   4. There are occasional sigmoid diverticula and focal fat stranding about a diverticulum of the mid sigmoid (series 2, image 103). Correlate for signs and symptoms of acute diverticulitis.   5. Other chronic, incidental, and postoperative findings as detailed above.   06/24/2019 Tumor Marker   Patient's tumor was tested for the following markers: CA-125 Results of the tumor marker test revealed 14.4.   08/19/2019 Tumor Marker   Patient's tumor was tested for the following markers: CA-125 Results of the tumor marker test revealed 10.3   09/25/2019 Imaging   CT chest 1. Interval resolution of previously demonstrated clustered centrilobular nodularity at both lung bases consistent with resolved inflammation. Stable 6 mm right upper lobe nodule consistent with a benign finding. No new or enlarging pulmonary nodules.  2. Stable chronic bronchiectasis and scarring in the right middle lobe and lingula. 3. No evidence of metastatic disease. 4. Aortic Atherosclerosis (ICD10-I70.0).   10/30/2019 Tumor Marker   Patient's tumor was tested for the following markers: CA-125 Results of the tumor marker  test revealed 12.1   12/25/2019 Tumor Marker   Patient's tumor was tested for the following markers: CA-125. Results of the tumor marker test revealed 14.8.   02/19/2020 Tumor Marker   Patient's tumor was tested for the following markers: CA-125. Results of the tumor marker test revealed 15.5   04/22/2020 Tumor Marker   Patient's tumor was tested for the following markers: CA-125 Results of the tumor marker test revealed 8.9   06/16/2020 Tumor Marker   Patient's tumor was tested for the following markers: CA-125 Results of the tumor marker test revealed 12.5   06/23/2020 Imaging   1. Stable exam. No new or progressive findings to suggest recurrent or metastatic disease. 2. Aortic  Atherosclerosis (ICD10-I70.0).   08/19/2020 Tumor Marker   Patient's tumor was tested for the following markers: CA-125 Results of the tumor marker test revealed 19.1   09/21/2020 Tumor Marker   Patient's tumor was tested for the following markers: CA-125 Results of the tumor marker test revealed 12.7.   11/17/2020 Tumor Marker   Patient's tumor was tested for the following markers: CA-125 Results of the tumor marker test revealed 14.7   01/11/2021 Tumor Marker   Patient's tumor was tested for the following markers: CA-125 Results of the tumor marker test revealed 11.6   02/24/2021 Tumor Marker   Patient's tumor was tested for the following markers: CA125. Results of the tumor marker test revealed 11.8   04/21/2021 Tumor Marker   Patient's tumor was tested for the following markers: CA-125. Results of the tumor marker test revealed 10.9.   06/24/2021 Tumor Marker   Patient's tumor was tested for the following markers: CA-125. Results of the tumor marker test revealed 12.9.   08/11/2021 Tumor Marker   Patient's tumor was tested for the following markers: CA-125. Results of the tumor marker test revealed 12.6.   11/30/2021 Tumor Marker   Patient's tumor was tested for the following markers: CA-125. Results of the tumor marker test revealed 13.0.   01/02/2022 Tumor Marker   Patient's tumor was tested for the following markers: CA-125. Results of the tumor marker test revealed 11.   01/05/2022 Imaging   1. Non complicated descending and sigmoid diverticulitis. 2. No evidence of metastatic disease within the abdomen or pelvis. 3.  Aortic Atherosclerosis (ICD10-I70.0). 4. Upper pole right renal lesion demonstrates complexity, but was characterized as a complex cyst on 02/14/2018 abdominal MRI.     03/20/2022 Tumor Marker   Patient's tumor was tested for the following markers: CA-125. Results of the tumor marker test revealed 12.5.   03/28/2022 Procedure   Removal of implanted  Port-A-Cath utilizing sharp and blunt dissection. The procedure was uncomplicated.   06/22/2022 Tumor Marker   Patient's tumor was tested for the following markers: CA-125. Results of the tumor marker test revealed 12.9.     PHYSICAL EXAMINATION: ECOG PERFORMANCE STATUS: 1 - Symptomatic but completely ambulatory  Vitals:   09/19/22 0954  BP: (!) 144/59  Pulse: 64  Resp: 18  SpO2: 100%   Filed Weights   09/19/22 0954  Weight: 166 lb 6.4 oz (75.5 kg)    GENERAL:alert, no distress and comfortable NEURO: alert & oriented x 3 with fluent speech, no focal motor/sensory deficits  LABORATORY DATA:  I have reviewed the data as listed    Component Value Date/Time   NA 140 09/19/2022 0935   K 4.6 09/19/2022 0935   CL 106 09/19/2022 0935   CO2 28 09/19/2022 0935   GLUCOSE 132 (H) 09/19/2022  0935   BUN 22 09/19/2022 0935   CREATININE 1.00 09/19/2022 0935   CREATININE 0.78 11/29/2021 0955   CALCIUM 9.8 09/19/2022 0935   PROT 6.7 09/19/2022 0935   ALBUMIN 4.1 09/19/2022 0935   AST 29 09/19/2022 0935   AST 31 11/29/2021 0955   ALT 24 09/19/2022 0935   ALT 28 11/29/2021 0955   ALKPHOS 58 09/19/2022 0935   BILITOT 0.7 09/19/2022 0935   BILITOT 0.5 11/29/2021 0955   GFRNONAA 57 (L) 09/19/2022 0935   GFRNONAA >60 11/29/2021 0955   GFRAA >60 06/16/2020 0918   GFRAA >60 02/13/2019 1024    No results found for: "SPEP", "UPEP"  Lab Results  Component Value Date   WBC 6.0 09/19/2022   NEUTROABS 3.6 09/19/2022   HGB 12.5 09/19/2022   HCT 36.7 09/19/2022   MCV 104.3 (H) 09/19/2022   PLT 175 09/19/2022      Chemistry      Component Value Date/Time   NA 140 09/19/2022 0935   K 4.6 09/19/2022 0935   CL 106 09/19/2022 0935   CO2 28 09/19/2022 0935   BUN 22 09/19/2022 0935   CREATININE 1.00 09/19/2022 0935   CREATININE 0.78 11/29/2021 0955      Component Value Date/Time   CALCIUM 9.8 09/19/2022 0935   ALKPHOS 58 09/19/2022 0935   AST 29 09/19/2022 0935   AST 31  11/29/2021 0955   ALT 24 09/19/2022 0935   ALT 28 11/29/2021 0955   BILITOT 0.7 09/19/2022 0935   BILITOT 0.5 11/29/2021 0955

## 2022-09-19 NOTE — Assessment & Plan Note (Signed)
She has chronic constipation Recommend gentle laxatives and avoid taking Gas-X

## 2022-09-19 NOTE — Assessment & Plan Note (Signed)
Her recent CT imaging study in March 2023 and blood work showed no signs of cancer recurrence We discussed risk and benefits of discontinuation of Rubraca and she is in agreement She was stopped once her current prescription runs out I will see her in 3 months for further follow-up and repeat imaging study

## 2022-09-21 ENCOUNTER — Telehealth: Payer: Self-pay

## 2022-09-21 LAB — CA 125: Cancer Antigen (CA) 125: 15.6 U/mL (ref 0.0–38.1)

## 2022-09-21 NOTE — Telephone Encounter (Signed)
-----   Message from Heath Lark, MD sent at 09/21/2022  8:10 AM EST ----- Pls let her know CA-125 is stable

## 2022-09-21 NOTE — Telephone Encounter (Signed)
Called Pt to affirm that latest CA 125 of 15.9 is stable. Pt confirmed understanding.

## 2022-10-10 ENCOUNTER — Other Ambulatory Visit (HOSPITAL_COMMUNITY): Payer: Self-pay

## 2022-11-14 ENCOUNTER — Telehealth: Payer: Self-pay | Admitting: Oncology

## 2022-11-14 NOTE — Telephone Encounter (Signed)
Called [name] back and advised her of recommendations from [name].  She verbalized understanding and has an appointment to see her PCP - Dr. Bea Graff this afternoon at 3:30.

## 2022-11-14 NOTE — Telephone Encounter (Signed)
[name] called and reports having pain in her left side that started Sunday night.  She is wondering if it is diverticulitis. She did eat some soup with corn in it on Saturday.  She also reports having diarrhea.    She was not sure who to call about this - [name] or her PCP.  She also asked if the CT expected on 12/15/22 would show the area.

## 2022-11-14 NOTE — Telephone Encounter (Signed)
She had history of recurrent diverticulitis I am not comfortable calling in antibiotics without assessment I have no room to add her, she can call PCP or see [facility] if they are available

## 2022-11-16 ENCOUNTER — Telehealth: Payer: Self-pay | Admitting: Oncology

## 2022-11-16 NOTE — Telephone Encounter (Signed)
Called Daisee back and advised her that Sherol Dade, PA-C is recommending that she go to the [place] ED to be evaluated and for a scan.  Amada verbalized agreement and will go now.

## 2022-11-16 NOTE — Telephone Encounter (Signed)
[name] called back and has decided to the [facility] ER because it is closer for her and she is going to drive herself.

## 2022-11-16 NOTE — Telephone Encounter (Signed)
[name] called and saw her PCP - Dr. Bea Graff on Tuesday.  He put her on Cipro and Flagyl and has recommended that she go to the ER in [place] to get a CT scan.  She does not want to go to the ER for a CT scan due to the cost and is worried because she doesn't know of any GI doctors in [place].  She states that she is not trying to be difficult but is wondering if [name] would be able to order a CT scan as an outpatient and possibly see her today.

## 2022-11-16 NOTE — Telephone Encounter (Signed)
I am overbooked I cannot see her today You can ask if [name] [facility] is available

## 2022-12-04 DIAGNOSIS — M179 Osteoarthritis of knee, unspecified: Secondary | ICD-10-CM

## 2022-12-04 DIAGNOSIS — M79644 Pain in right finger(s): Secondary | ICD-10-CM

## 2022-12-04 HISTORY — DX: Pain in right finger(s): M79.644

## 2022-12-04 HISTORY — DX: Osteoarthritis of knee, unspecified: M17.9

## 2022-12-15 ENCOUNTER — Other Ambulatory Visit: Payer: Self-pay

## 2022-12-15 ENCOUNTER — Ambulatory Visit (HOSPITAL_COMMUNITY)
Admission: RE | Admit: 2022-12-15 | Discharge: 2022-12-15 | Disposition: A | Discharge status: Home / Self Care | Payer: Medicare HMO | Source: Ambulatory Visit | Attending: Hematology and Oncology | Admitting: Hematology and Oncology

## 2022-12-15 ENCOUNTER — Inpatient Hospital Stay: Payer: Medicare HMO | Attending: Hematology and Oncology

## 2022-12-15 DIAGNOSIS — R42 Dizziness and giddiness: Secondary | ICD-10-CM | POA: Diagnosis not present

## 2022-12-15 DIAGNOSIS — C562 Malignant neoplasm of left ovary: Secondary | ICD-10-CM

## 2022-12-15 DIAGNOSIS — R5383 Other fatigue: Secondary | ICD-10-CM | POA: Diagnosis not present

## 2022-12-15 DIAGNOSIS — E039 Hypothyroidism, unspecified: Secondary | ICD-10-CM | POA: Insufficient documentation

## 2022-12-15 DIAGNOSIS — Z8543 Personal history of malignant neoplasm of ovary: Secondary | ICD-10-CM | POA: Insufficient documentation

## 2022-12-15 LAB — CMP (CANCER CENTER ONLY)
ALT: 25 U/L (ref 0–44)
AST: 23 U/L (ref 15–41)
Albumin: 3.6 g/dL (ref 3.5–5.0)
Alkaline Phosphatase: 55 U/L (ref 38–126)
Anion gap: 6 (ref 5–15)
BUN: 20 mg/dL (ref 8–23)
CO2: 24 mmol/L (ref 22–32)
Calcium: 9.3 mg/dL (ref 8.9–10.3)
Chloride: 109 mmol/L (ref 98–111)
Creatinine: 0.85 mg/dL (ref 0.44–1.00)
GFR, Estimated: >60 mL/min (ref >60)
Glucose, Bld: 120 mg/dL — ABNORMAL HIGH (ref 70–99)
Potassium: 5 mmol/L (ref 3.5–5.1)
Sodium: 139 mmol/L (ref 135–145)
Total Bilirubin: 0.5 mg/dL (ref 0.3–1.2)
Total Protein: 6.6 g/dL (ref 6.5–8.1)

## 2022-12-15 LAB — CBC WITH DIFFERENTIAL/PLATELET
Abs Immature Granulocytes: 0.2 10*3/uL — ABNORMAL HIGH (ref 0.00–0.07)
Basophils Absolute: 0 10*3/uL (ref 0.0–0.1)
Basophils Relative: 0 %
Eosinophils Absolute: 0.1 10*3/uL (ref 0.0–0.5)
Eosinophils Relative: 1 %
HCT: 39.4 % (ref 36.0–46.0)
Hemoglobin: 13.2 g/dL (ref 12.0–15.0)
Immature Granulocytes: 2 %
Lymphocytes Relative: 29 %
Lymphs Abs: 2.8 10*3/uL (ref 0.7–4.0)
MCH: 33.1 pg (ref 26.0–34.0)
MCHC: 33.5 g/dL (ref 30.0–36.0)
MCV: 98.7 fL (ref 80.0–100.0)
Monocytes Absolute: 0.5 10*3/uL (ref 0.1–1.0)
Monocytes Relative: 6 %
Neutro Abs: 5.8 10*3/uL (ref 1.7–7.7)
Neutrophils Relative %: 62 %
Platelets: 209 10*3/uL (ref 150–400)
RBC: 3.99 MIL/uL (ref 3.87–5.11)
RDW: 13.7 % (ref 11.5–15.5)
WBC: 9.5 10*3/uL (ref 4.0–10.5)
nRBC: 0 % (ref 0.0–0.2)

## 2022-12-15 MED ORDER — SODIUM CHLORIDE (PF) 0.9 % IJ SOLN
INTRAMUSCULAR | Status: AC
  Filled 2022-12-15: qty 50

## 2022-12-15 MED ORDER — IOHEXOL 9 MG/ML PO SOLN
ORAL | Status: AC
  Filled 2022-12-15: qty 1000

## 2022-12-15 MED ORDER — IOHEXOL 9 MG/ML PO SOLN
1000.0000 mL | ORAL | Status: AC
  Administered 2022-12-15: 1000 mL via ORAL

## 2022-12-15 MED ORDER — IOHEXOL 300 MG/ML  SOLN
100.0000 mL | Freq: Once | INTRAMUSCULAR | Status: AC | PRN
  Administered 2022-12-15: 100 mL via INTRAVENOUS

## 2022-12-16 LAB — CA 125: Cancer Antigen (CA) 125: 14.1 U/mL (ref 0.0–38.1)

## 2022-12-18 ENCOUNTER — Telehealth: Payer: Self-pay | Admitting: Oncology

## 2022-12-18 NOTE — Telephone Encounter (Signed)
[name] called and is concerned about her CBC results - abs immature granulocytes which are high.  She has looked it up and is worried because it can be a sign of MDS.  She also has not been feeling well since January with no energy and feeling depressed.  She is wondering if her thyroid levels are off.  She did have a TSH recently with Dr. Bea Graff.  Advised her to discuss this with [name] at her appointment tomorrow.

## 2022-12-19 ENCOUNTER — Other Ambulatory Visit: Payer: Self-pay

## 2022-12-19 ENCOUNTER — Inpatient Hospital Stay: Payer: Medicare HMO | Admitting: Hematology and Oncology

## 2022-12-19 ENCOUNTER — Encounter: Payer: Self-pay | Admitting: Hematology and Oncology

## 2022-12-19 VITALS — BP 112/65 | HR 64 | Resp 18 | Ht 64.0 in | Wt 165.4 lb

## 2022-12-19 DIAGNOSIS — R5383 Other fatigue: Secondary | ICD-10-CM

## 2022-12-19 DIAGNOSIS — C562 Malignant neoplasm of left ovary: Secondary | ICD-10-CM

## 2022-12-19 DIAGNOSIS — E039 Hypothyroidism, unspecified: Secondary | ICD-10-CM

## 2022-12-19 NOTE — Assessment & Plan Note (Signed)
She has excessive fatigue and occasional dizziness The patient is on 3 different antihypertensives Her blood pressure is borderline low I recommend consideration to discontinue hydrochlorothiazide for few days and to check her blood pressure The patient stated she is drinking lots of fluids

## 2022-12-19 NOTE — Progress Notes (Signed)
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[facility]) I personally reviewed her imaging study which showed no evidence of disease Her tumor marker is normal We discussed future follow-up I plan to see her again in 3 months for further follow-up  Acquired hypothyroidism She has excessive sensation of fatigue Her primary care doctor is adjusting the dose of her thyroid replacement therapy  Other fatigue She has excessive fatigue and occasional dizziness The patient is on 3 different antihypertensives Her blood pressure is borderline low I recommend consideration to discontinue hydrochlorothiazide for few days and to check her blood pressure The patient stated she is drinking lots of fluids  No orders of the defined types were placed in this encounter.   All questions were answered. The patient knows to call the clinic with any problems, questions or concerns. The total time spent in the appointment was 20 minutes encounter with patients including review of chart and various tests results, discussions about plan of care and coordination of care plan   Heath Lark, MD 12/19/2022 12:26 PM  INTERVAL HISTORY: Please see below for problem oriented charting. she returns for review test results She complained of excessive fatigue She has occasional dizziness She had recent diverticulitis that has resolved with antibiotics  REVIEW OF SYSTEMS:   Constitutional: Denies fevers, chills or abnormal weight loss Eyes: Denies blurriness of vision Ears, nose, mouth, throat, and face: Denies mucositis or sore throat Respiratory: Denies cough, dyspnea or wheezes Cardiovascular: Denies palpitation, chest discomfort or lower extremity swelling Gastrointestinal:  Denies nausea, heartburn or change in bowel habits Skin: Denies abnormal skin rashes Lymphatics: Denies new  lymphadenopathy or easy bruising Neurological:Denies numbness, tingling or new weaknesses Behavioral/Psych: Mood is stable, no new changes  All other systems were reviewed with the patient and are negative.  I have reviewed the past medical history, past surgical history, social history and family history with the patient and they are unchanged from previous note.  ALLERGIES:  is allergic to penicillins and rosuvastatin.  MEDICATIONS:  Current Outpatient Medications  Medication Sig Dispense Refill   ALPRAZolam (XANAX) 0.5 MG tablet Take 0.5 mg by mouth at bedtime as needed for sleep.     amLODipine (NORVASC) 5 MG tablet Take 5 mg by mouth daily.     atorvastatin (LIPITOR) 10 MG tablet Take 10 mg by mouth daily.     calcium carbonate (TUMS) 500 MG chewable tablet Chew 1 tablet (200 mg of elemental calcium total) by mouth daily. 30 tablet 0   cholecalciferol (VITAMIN D3) 25 MCG (1000 UNIT) tablet Take 2,000 Units by mouth daily.     Cyanocobalamin 1000 MCG/ML KIT Inject 1,000 mcg as directed every 14 (fourteen) days.     famotidine (PEPCID) 40 MG tablet Take by mouth.     FLUoxetine (PROZAC) 20 MG capsule Take 20 mg by mouth daily after breakfast.     halobetasol (ULTRAVATE) 0.05 % ointment Apply to affected areas as directed twice daily until clear 50 g 0   hydrochlorothiazide (HYDRODIURIL) 25 MG tablet Take 25 mg by mouth daily.     levothyroxine (SYNTHROID) 100 MCG tablet Take 1 tablet by mouth daily.     linaclotide (LINZESS) 145 MCG CAPS capsule Take 145 mcg by mouth daily before breakfast.     losartan (COZAAR) 100 MG tablet Take 100 mg by mouth at bedtime.  Melatonin 5 MG TABS Take 5-10 mg by mouth at bedtime as needed (sleep).      metFORMIN (GLUCOPHAGE-XR) 500 MG 24 hr tablet Take 1,000 mg by mouth 2 (two) times daily.      montelukast (SINGULAIR) 10 MG tablet Take 1 tablet (10 mg total) by mouth at bedtime. 30 tablet 5   triamcinolone ointment (KENALOG) 0.1 % Apply to affected  areas 4 times daily until clear 80 g 0   No current facility-administered medications for this visit.    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 - TUMOR INVOLVES OVARY SURFACE AND LEFT FALLOPIAN TUBE. - SEE ONCOLOGY TABLE. 2. Mesentery, small bowel mesentery biopsy #1 - BENIGN FIBROADIPOSE TISSUE. 3. Ovary and fallopian tube, right - BENIGN OVARY WITH INCLUSION CYSTS. - BENIGN FALLOPIAN TUBE WITH PARATUBAL CYSTS AND ADENOFIBROMA. 4. Omentum, resection for tumor - BENIGN ADIPOSE TISSUE. 5. Peritoneum, biopsy, left diaphragmatic - BENIGN PERITONEAL TYPE TISSUE. 6. Peritoneum, biopsy, right diaphragmatic - BENIGN PERITONEAL TYPE TISSUE. 7. Mesentery, small bowel mesenteric biopsy #2 - BENIGN FIBROADIPOSE TISSUE. 8. Lymph nodes, regional resection, right pelvic - FIVE OF FIVE LYMPH NODES NEGATIVE FOR CARCINOMA (0/5). 9. Lymph nodes, regional resection, left pelvic - FOUR OF FOUR LYMPH NODES NEGATIVE FOR CARCINOMA (0/4). 10. Peritoneum, biopsy, left gutter - BENIGN PERITONEAL TYPE TISSUE. 11. Peritoneum, biopsy, bladder - BENIGN PERITONEAL TYPE TISSUE WITH ACUTE INFLAMMATION AND CALCIFICATIONS.  12. Peritoneum, biopsy, cul-de-sac - BENIGN PERITONEAL TYPE TISSUE WITH ACUTE INFLAMMATION AND CALCIFICATIONS. 13. Soft tissue, biopsy, right gutter - BENIGN FIBROADIPOSE TISSUE. 14. Lymph node, biopsy, right para-aortic - TWO OF TWO LYMPH NODES NEGATIVE FOR CARCINOMA (0/2). 15. Lymph node, biopsy, left para-aortic - ONE OF ONE LYMPH NODES NEGATIVE FOR CARCINOMA (0/1). Microscopic Comment 1.  OVARY Specimen(s): Left ovary and fallopian tube. Procedure: (including lymph node sampling): Bilateral salpingo-oophorectomy with omental and peritoneal biopsies and lymph node biopsies. Primary tumor site (including laterality): Left ovary. Ovarian surface involvement: Present. Ovarian capsule intact without fragmentation: Intact. Maximum tumor size (cm): 11 cm total. Histologic type: Serous cystadenocarcinoma. Grade: High grade (low grade areas also present). Peritoneal implants: (specify invasive or non-invasive): N/A. Pelvic extension (list additional structures on separate lines and if involved): Left fallopian tube. Lymph nodes: number examined 12 ; number positive 0 TNM code: pT2a, pN0, pMX FIGO Stage (based on pathologic findings, needs clinical correlation): IIA Comments: None.   01/31/2018 Pathology Results   PERITONEAL WASHING (SPECIMEN 1 OF 1 COLLECTED 01/31/18): MALIGNANT CELLS PRESENT CONSISTENT WITH METASTATIC CARCINOMA.   01/31/2018 Surgery   Procedure(s) Performed:  1. Robotic BSO and washings. 2. Exploratory laparotomy, Staging including infragastric omentectomy, Bilateral pelvic and paraaortic lymphadenectomy, pertioneal biopsies.    Specimens: Bilateral tubes / ovaries, bilateral pelvic and paraaortic lymph nodes, peritoneal biopsies, washings and omentum.  Operative Findings: The left adnexa was adherent to the left pelvic peritoneum suspected due to the inferior retraction from the vaginal hysterectomy. Intraoperative leakage of cystic fluid from left ovary.  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  The right adnexa grossly appeared normal although slightly enlarged for her age.  There were  some indurated lymph nodes but no overtly malignant lymph nodes were suspected.  Small bowel mesentery had 3-4 superficial possible implants.  1 of these was sent for frozen section returned benign.  She did have adhesive disease from  her open cholecystectomy in the right upper quadrant.  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 16 mm exophytic lesion upper pole right kidney has attenuation too high to be a simple cyst. This may be a cyst complicated by proteinaceous debris or hemorrhage, but renal cell carcinoma is a concern. Routine outpatient follow-up MRI of the abdomen without and with contrast recommended after resolution of patient's acute symptoms (so she is better able to participate with positioning and breath holding). 2. Mild edema/possible minimal hemorrhage in the extraperitoneal soft tissues of the pelvic for tracking up both pelvic sidewalls. Imaging appearance is not outside of the spectrum of findings expected 6 days after the reported surgery. Trace interloop mesenteric and free fluid is identified in the peritoneal cavity in there is some mild edema in the omentum. There is no organized or rim enhancing collection to suggest evolving abscess. 3. Gas in the extraperitoneal soft tissues of the left lower quadrant is associated with gas in the subcutaneous fat of the lower left anterior abdominal wall. This is not unexpected on postoperative day 6. No evidence for intraperitoneal free air. 4. No CT features to suggest small bowel obstruction. Oral contrast material has migrated about halfway through the small bowel loops but there is no differential distention of proximal versus distal small bowel. The colon is diffusely distended and fluid-filled proximally, but formed stool is noted in the distal colon. Given apparent  slow migration of contrast and fluid-filled small and large bowel loops, a component of ileus is possible.   02/14/2018 Imaging   MR abdomen Benign Bosniak category 2 cyst in upper pole of right kidney, corresponding with lesion seen on previous CT. No evidence of renal neoplasm or other significant abnormality.     02/18/2018 Cancer Staging   Staging form: Ovary, Fallopian Tube, and Primary Peritoneal Carcinoma, AJCC 8th Edition - Pathologic: Stage II (pT2, pN0, cM0) - Signed by Heath Lark, MD on 02/18/2018   02/28/2018 Procedure   Status post right IJ port catheter placement. 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 For last cycle, taxol was omitted   07/18/2018 Imaging   1. No evidence for residual or recurrent tumor within the abdomen or pelvis. There has been interval resolution edema and fluid within the peritoneal cavity. 2. New pulmonary nodule is identified within the right lower lobe measuring 1.1 cm, image 87/4. Consider one of the following in 3 months for both low-risk and high-risk individuals: (a) repeat chest CT, (b) follow-up PET-CT, or (c) tissue sampling. This recommendation follows the consensus statement: Guidelines for Management of Incidental Pulmonary Nodules Detected on  CT Images: From the Fleischner Society 2017; Radiology 2017; 284:228-243. 3. Stable 7 mm nodule within the right upper lobe. 4.  Aortic Atherosclerosis (ICD10-I70.0). 5. Multi vessel coronary artery atherosclerotic calcifications.   07/18/2018 Tumor Marker   Patient's tumor was tested for the following markers: CA-125 Results of the tumor marker test revealed 14.9   07/25/2018 PET scan   1. Regressing right lower lobe peripheral lung lesion, likely resolving  inflammatory or atelectatic process. No hypermetabolism to suggest malignancy. I would recommend a follow-up noncontrast chest CT and 4-6 months. Small upper lobe pulmonary nodules are stable. 2. 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   07/31/2018 -  Chemotherapy   The patient started taking rucaparib   10/25/2018 Tumor Marker   Patient's tumor was tested for the following markers: CA-125 Results of the tumor marker test revealed 12.1   11/25/2018 Imaging   1. Right lower lobe nodule has resolved. Additional right lung nodules are stable. 2. Stable hazy nodularity along the left external iliac chain.  3. Hepatomegaly. 4. Aortic atherosclerosis (ICD10-170.0). Coronary artery calcification. 5. Enlarged pulmonic trunk, indicative of pulmonary arterial hypertension.   01/16/2019 Tumor Marker   Patient's tumor was tested for the following markers: CA-125 Results of the tumor marker test revealed 13.1   05/22/2019 Tumor Marker   Patient's tumor was tested for the following markers: CA-125 Results of the tumor marker test revealed 14.4   06/24/2019 Imaging   Ct chest, abdomen and pelvis 1. There are multiple clustered tiny centrilobular nodules and irregular opacities of the bilateral lower lobes and lingula, increased compared to prior examination, with underlying bandlike scarring of the medial right middle lobe and right lung base (e.g. series 4, image 100, 110, 123). 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  2. Stable 6 mm pulmonary nodule of the right upper lobe (series 4, image 45). Attention on follow-up.   3. Status post hysterectomy and oophorectomy. No evidence of recurrent or metastatic disease in the abdomen or pelvis.   4. There are occasional sigmoid diverticula and focal fat stranding about a diverticulum of the mid sigmoid (series  2, image 103). Correlate for signs and symptoms of acute diverticulitis.   5. Other chronic, incidental, and postoperative findings as detailed above.   06/24/2019 Tumor Marker   Patient's tumor was tested for the following markers: CA-125 Results of the tumor marker test revealed 14.4.   08/19/2019 Tumor Marker   Patient's tumor was tested for the following markers: CA-125 Results of the tumor marker test revealed 10.3   09/25/2019 Imaging   CT chest 1. Interval resolution of previously demonstrated clustered centrilobular nodularity at both lung bases consistent with resolved inflammation. Stable 6 mm right upper lobe nodule consistent with a benign finding. No new or enlarging pulmonary nodules.  2. Stable chronic bronchiectasis and scarring in the right middle lobe and lingula. 3. No evidence of metastatic disease. 4. Aortic Atherosclerosis (ICD10-I70.0).   10/30/2019 Tumor Marker   Patient's tumor was tested for the following markers: CA-125 Results of the tumor marker test revealed 12.1   12/25/2019 Tumor Marker   Patient's tumor was tested for the following markers: CA-125. Results of the tumor marker test revealed 14.8.   02/19/2020 Tumor Marker   Patient's tumor was tested for the following markers: CA-125. Results of the tumor marker test revealed 15.5   04/22/2020 Tumor Marker   Patient's tumor was tested  for the following markers: CA-125 Results of the tumor marker test revealed 8.9   06/16/2020 Tumor Marker   Patient's tumor was tested for the following markers: CA-125 Results of the tumor marker test revealed 12.5   06/23/2020 Imaging   1. Stable exam. No new or progressive findings to suggest recurrent or metastatic disease. 2. Aortic Atherosclerosis (ICD10-I70.0).   08/19/2020 Tumor Marker   Patient's tumor was tested for the following markers: CA-125 Results of the tumor marker test revealed 19.1   09/21/2020 Tumor Marker   Patient's tumor was tested for the  following markers: CA-125 Results of the tumor marker test revealed 12.7.   11/17/2020 Tumor Marker   Patient's tumor was tested for the following markers: CA-125 Results of the tumor marker test revealed 14.7   01/11/2021 Tumor Marker   Patient's tumor was tested for the following markers: CA-125 Results of the tumor marker test revealed 11.6   02/24/2021 Tumor Marker   Patient's tumor was tested for the following markers: CA125. Results of the tumor marker test revealed 11.8   04/21/2021 Tumor Marker   Patient's tumor was tested for the following markers: CA-125. Results of the tumor marker test revealed 10.9.   06/24/2021 Tumor Marker   Patient's tumor was tested for the following markers: CA-125. Results of the tumor marker test revealed 12.9.   08/11/2021 Tumor Marker   Patient's tumor was tested for the following markers: CA-125. Results of the tumor marker test revealed 12.6.   11/30/2021 Tumor Marker   Patient's tumor was tested for the following markers: CA-125. Results of the tumor marker test revealed 13.0.   01/02/2022 Tumor Marker   Patient's tumor was tested for the following markers: CA-125. Results of the tumor marker test revealed 11.   01/05/2022 Imaging   1. Non complicated descending and sigmoid diverticulitis. 2. No evidence of metastatic disease within the abdomen or pelvis. 3.  Aortic Atherosclerosis (ICD10-I70.0). 4. Upper pole right renal lesion demonstrates complexity, but was characterized as a complex cyst on 02/14/2018 abdominal MRI.     03/20/2022 Tumor Marker   Patient's tumor was tested for the following markers: CA-125. Results of the tumor marker test revealed 12.5.   03/28/2022 Procedure   Removal of implanted Port-A-Cath utilizing sharp and blunt dissection. The procedure was uncomplicated.   06/22/2022 Tumor Marker   Patient's tumor was tested for the following markers: CA-125. Results of the tumor marker test revealed 12.9.   09/21/2022 Tumor  Marker   Patient's tumor was tested for the following markers: CA-125. Results of the tumor marker test revealed 15.6.   12/18/2022 Tumor Marker   Patient's tumor was tested for the following markers: CA-125. Results of the tumor marker test revealed 14.1.   12/18/2022 Imaging   No evidence of recurrent or metastatic carcinoma within the abdomen or pelvis.   Sigmoid diverticulosis. No radiographic evidence of diverticulitis.   Aortic Atherosclerosis (ICD10-I70.0).     PHYSICAL EXAMINATION: ECOG PERFORMANCE STATUS: 1 - Symptomatic but completely ambulatory  Vitals:   12/19/22 0943  BP: 112/65  Pulse: 64  Resp: 18  SpO2: 100%   Filed Weights   12/19/22 0943  Weight: 165 lb 6.4 oz (75 kg)    GENERAL:alert, no distress and comfortable  NEURO: alert & oriented x 3 with fluent speech, no focal motor/sensory deficits  LABORATORY DATA:  I have reviewed the data as listed    Component Value Date/Time   NA 139 12/15/2022 0912   K 5.0 12/15/2022 0912  CL 109 12/15/2022 0912   CO2 24 12/15/2022 0912   GLUCOSE 120 (H) 12/15/2022 0912   BUN 20 12/15/2022 0912   CREATININE 0.85 12/15/2022 0912   CALCIUM 9.3 12/15/2022 0912   PROT 6.6 12/15/2022 0912   ALBUMIN 3.6 12/15/2022 0912   AST 23 12/15/2022 0912   ALT 25 12/15/2022 0912   ALKPHOS 55 12/15/2022 0912   BILITOT 0.5 12/15/2022 0912   GFRNONAA >60 12/15/2022 0912   GFRAA >60 06/16/2020 0918   GFRAA >60 02/13/2019 1024    No results found for: "SPEP", "UPEP"  Lab Results  Component Value Date   WBC 9.5 12/15/2022   NEUTROABS 5.8 12/15/2022   HGB 13.2 12/15/2022   HCT 39.4 12/15/2022   MCV 98.7 12/15/2022   PLT 209 12/15/2022      Chemistry      Component Value Date/Time   NA 139 12/15/2022 0912   K 5.0 12/15/2022 0912   CL 109 12/15/2022 0912   CO2 24 12/15/2022 0912   BUN 20 12/15/2022 0912   CREATININE 0.85 12/15/2022 0912      Component Value Date/Time   CALCIUM 9.3 12/15/2022 0912   ALKPHOS 55  12/15/2022 0912   AST 23 12/15/2022 0912   ALT 25 12/15/2022 0912   BILITOT 0.5 12/15/2022 0912       RADIOGRAPHIC STUDIES: I have personally reviewed the radiological images as listed and agreed with the findings in the report. CT ABDOMEN PELVIS W CONTRAST  Result Date: 12/18/2022 CLINICAL DATA:  Follow-up metastatic ovarian carcinoma. Undergoing chemotherapy. * Tracking Code: BO * EXAM: CT ABDOMEN AND PELVIS WITH CONTRAST TECHNIQUE: Multidetector CT imaging of the abdomen and pelvis was performed using the standard protocol following bolus administration of intravenous contrast. RADIATION DOSE REDUCTION: This exam was performed according to the departmental dose-optimization program which includes automated exposure control, adjustment of the mA and/or kV according to patient size and/or use of iterative reconstruction technique. CONTRAST:  12m OMNIPAQUE IOHEXOL 300 MG/ML  SOLN COMPARISON:  11/16/2022 and 01/04/2022 FINDINGS: Lower Chest: No acute findings. Hepatobiliary: No hepatic masses identified. Prior cholecystectomy. No evidence of biliary obstruction. Pancreas:  No mass or inflammatory changes. Spleen: Within normal limits in size and appearance. Adrenals/Urinary Tract: No suspicious masses identified. No evidence of ureteral calculi or hydronephrosis. Stomach/Bowel: No evidence of obstruction, inflammatory process or abnormal fluid collections. Sigmoid diverticulosis is again noted, however there has been resolution of diverticulitis since prior study. No evidence of abscess or free fluid. Normal appendix visualized. Vascular/Lymphatic: No pathologically enlarged lymph nodes. No acute vascular findings. Aortic atherosclerotic calcification incidentally noted. Reproductive: Prior hysterectomy noted. Adnexal regions are unremarkable in appearance. No evidence of pelvic mass, peritoneal thickening, or ascites. Other:  None. Musculoskeletal: No suspicious bone lesions identified. Prior L3  vertebroplasty again noted. IMPRESSION: No evidence of recurrent or metastatic carcinoma within the abdomen or pelvis. Sigmoid diverticulosis. No radiographic evidence of diverticulitis. Aortic Atherosclerosis (ICD10-I70.0). Electronically Signed   By: JMarlaine HindM.D.   On: 12/18/2022 11:34

## 2022-12-19 NOTE — Assessment & Plan Note (Signed)
She has excessive sensation of fatigue Her primary care doctor is adjusting the dose of her thyroid replacement therapy

## 2022-12-19 NOTE — Assessment & Plan Note (Signed)
I personally reviewed her imaging study which showed no evidence of disease Her tumor marker is normal We discussed future follow-up I plan to see her again in 3 months for further follow-up

## 2023-03-07 DIAGNOSIS — M12819 Other specific arthropathies, not elsewhere classified, unspecified shoulder: Secondary | ICD-10-CM | POA: Insufficient documentation

## 2023-03-07 DIAGNOSIS — M75100 Unspecified rotator cuff tear or rupture of unspecified shoulder, not specified as traumatic: Secondary | ICD-10-CM

## 2023-03-07 DIAGNOSIS — M545 Low back pain, unspecified: Secondary | ICD-10-CM

## 2023-03-07 HISTORY — DX: Low back pain, unspecified: M54.50

## 2023-03-07 HISTORY — DX: Unspecified rotator cuff tear or rupture of unspecified shoulder, not specified as traumatic: M75.100

## 2023-03-21 ENCOUNTER — Telehealth: Payer: Self-pay | Admitting: Oncology

## 2023-03-21 DIAGNOSIS — E039 Hypothyroidism, unspecified: Secondary | ICD-10-CM

## 2023-03-21 NOTE — Telephone Encounter (Signed)
[name] called and asked if she can have a TSH, T3 and T4 drawn tomorrow with her labs or if she needs to have them done through her PCP.

## 2023-03-21 NOTE — Telephone Encounter (Signed)
OK, please put in verbal orders or get help from [place]

## 2023-03-21 NOTE — Telephone Encounter (Signed)
We can run it as long as her PCP will be addressing the test results

## 2023-03-22 ENCOUNTER — Inpatient Hospital Stay: Payer: Medicare HMO | Admitting: Hematology and Oncology

## 2023-03-22 ENCOUNTER — Other Ambulatory Visit: Payer: Self-pay

## 2023-03-22 ENCOUNTER — Encounter: Payer: Self-pay | Admitting: Hematology and Oncology

## 2023-03-22 ENCOUNTER — Inpatient Hospital Stay: Payer: Medicare HMO | Attending: Hematology and Oncology

## 2023-03-22 VITALS — BP 133/62 | HR 61 | Temp 97.7°F | Resp 18 | Ht 64.0 in | Wt 164.6 lb

## 2023-03-22 DIAGNOSIS — R296 Repeated falls: Secondary | ICD-10-CM | POA: Insufficient documentation

## 2023-03-22 DIAGNOSIS — Z8543 Personal history of malignant neoplasm of ovary: Secondary | ICD-10-CM | POA: Diagnosis present

## 2023-03-22 DIAGNOSIS — C562 Malignant neoplasm of left ovary: Secondary | ICD-10-CM | POA: Diagnosis not present

## 2023-03-22 DIAGNOSIS — E039 Hypothyroidism, unspecified: Secondary | ICD-10-CM | POA: Diagnosis not present

## 2023-03-22 DIAGNOSIS — M25511 Pain in right shoulder: Secondary | ICD-10-CM | POA: Diagnosis not present

## 2023-03-22 HISTORY — DX: Repeated falls: R29.6

## 2023-03-22 LAB — COMPREHENSIVE METABOLIC PANEL
ALT: 15 U/L (ref 0–44)
AST: 18 U/L (ref 15–41)
Albumin: 4 g/dL (ref 3.5–5.0)
Alkaline Phosphatase: 66 U/L (ref 38–126)
Anion gap: 5 (ref 5–15)
BUN: 23 mg/dL (ref 8–23)
CO2: 27 mmol/L (ref 22–32)
Calcium: 9.4 mg/dL (ref 8.9–10.3)
Chloride: 107 mmol/L (ref 98–111)
Creatinine, Ser: 0.94 mg/dL (ref 0.44–1.00)
GFR, Estimated: >60 mL/min (ref >60)
Glucose, Bld: 105 mg/dL — ABNORMAL HIGH (ref 70–99)
Potassium: 4.2 mmol/L (ref 3.5–5.1)
Sodium: 139 mmol/L (ref 135–145)
Total Bilirubin: 0.7 mg/dL (ref 0.3–1.2)
Total Protein: 6.6 g/dL (ref 6.5–8.1)

## 2023-03-22 LAB — CBC WITH DIFFERENTIAL/PLATELET
Abs Immature Granulocytes: 0.09 10*3/uL — ABNORMAL HIGH (ref 0.00–0.07)
Basophils Absolute: 0.1 10*3/uL (ref 0.0–0.1)
Basophils Relative: 1 %
Eosinophils Absolute: 0.1 10*3/uL (ref 0.0–0.5)
Eosinophils Relative: 1 %
HCT: 39 % (ref 36.0–46.0)
Hemoglobin: 12.8 g/dL (ref 12.0–15.0)
Immature Granulocytes: 1 %
Lymphocytes Relative: 29 %
Lymphs Abs: 2.2 10*3/uL (ref 0.7–4.0)
MCH: 31.8 pg (ref 26.0–34.0)
MCHC: 32.8 g/dL (ref 30.0–36.0)
MCV: 96.8 fL (ref 80.0–100.0)
Monocytes Absolute: 0.5 10*3/uL (ref 0.1–1.0)
Monocytes Relative: 6 %
Neutro Abs: 4.6 10*3/uL (ref 1.7–7.7)
Neutrophils Relative %: 62 %
Platelets: 302 10*3/uL (ref 150–400)
RBC: 4.03 MIL/uL (ref 3.87–5.11)
RDW: 15.2 % (ref 11.5–15.5)
WBC: 7.4 10*3/uL (ref 4.0–10.5)
nRBC: 0 % (ref 0.0–0.2)

## 2023-03-22 LAB — TSH: TSH: 2.049 u[IU]/mL (ref 0.350–4.500)

## 2023-03-22 LAB — T4, FREE: Free T4: 0.95 ng/dL (ref 0.61–1.12)

## 2023-03-22 NOTE — Progress Notes (Signed)
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[facility]) Her last imaging study which showed no evidence of disease Her tumor marker is pending We discussed future follow-up I plan to see her again in 4 months for further follow-up  Acquired hypothyroidism Per patient request, we have drawn repeat TSH and free thyroxine  Recurrent falls She had recent recurrent falls She has severe right shoulder pain requiring surgery, awaiting cardiology clearance Continue conservative management We discussed importance of changes in home environment to reduce risk of recurrent falls  No orders of the defined types were placed in this encounter.   All questions were answered. The patient knows to call the clinic with any problems, questions or concerns. The total time spent in the appointment was 25 minutes encounter with patients including review of chart and various tests results, discussions about plan of care and coordination of care plan   Artis Delay, MD 03/22/2023 1:50 PM  INTERVAL HISTORY: Please see below for problem oriented charting. she returns for surveillance follow-up for history of ovarian cancer She had repeated fall recently On April 27, she fell and fractured her right shoulder requiring surgery, awaiting cardiology clearance She had another slip and fall recently but did not cause any major injury apart from bruising on her back From her cancer perspective, she felt fine Denies abdominal bloating or changes in bowel habits  REVIEW OF SYSTEMS:   Constitutional: Denies fevers, chills or abnormal weight loss Eyes: Denies blurriness of vision Ears, nose, mouth, throat, and face: Denies mucositis or sore throat Respiratory: Denies cough, dyspnea or wheezes Cardiovascular: Denies palpitation, chest discomfort or lower extremity  swelling Gastrointestinal:  Denies nausea, heartburn or change in bowel habits Skin: Denies abnormal skin rashes Lymphatics: Denies new lymphadenopathy or easy bruising Neurological:Denies numbness, tingling or new weaknesses Behavioral/Psych: Mood is stable, no new changes  All other systems were reviewed with the patient and are negative.  I have reviewed the past medical history, past surgical history, social history and family history with the patient and they are unchanged from previous note.  ALLERGIES:  is allergic to penicillins and rosuvastatin.  MEDICATIONS:  Current Outpatient Medications  Medication Sig Dispense Refill   ALPRAZolam (XANAX) 0.5 MG tablet Take 0.5 mg by mouth at bedtime as needed for sleep.     amLODipine (NORVASC) 5 MG tablet Take 5 mg by mouth daily.     atorvastatin (LIPITOR) 10 MG tablet Take 10 mg by mouth daily.     calcium carbonate (TUMS) 500 MG chewable tablet Chew 1 tablet (200 mg of elemental calcium total) by mouth daily. 30 tablet 0   cholecalciferol (VITAMIN D3) 25 MCG (1000 UNIT) tablet Take 2,000 Units by mouth daily.     Cyanocobalamin 1000 MCG/ML KIT Inject 1,000 mcg as directed every 14 (fourteen) days.     famotidine (PEPCID) 40 MG tablet Take by mouth.     FLUoxetine (PROZAC) 20 MG capsule Take 20 mg by mouth daily after breakfast.     halobetasol (ULTRAVATE) 0.05 % ointment Apply to affected areas as directed twice daily until clear 50 g 0   hydrochlorothiazide (HYDRODIURIL) 25 MG tablet Take 25 mg by mouth daily.     levothyroxine (SYNTHROID) 100 MCG tablet Take 1 tablet by mouth daily.     linaclotide (LINZESS) 145 MCG CAPS capsule Take 145 mcg by mouth daily before  breakfast.     losartan (COZAAR) 100 MG tablet Take 100 mg by mouth at bedtime.      Melatonin 5 MG TABS Take 5-10 mg by mouth at bedtime as needed (sleep).      metFORMIN (GLUCOPHAGE-XR) 500 MG 24 hr tablet Take 1,000 mg by mouth 2 (two) times daily.      montelukast  (SINGULAIR) 10 MG tablet Take 1 tablet (10 mg total) by mouth at bedtime. 30 tablet 5   triamcinolone ointment (KENALOG) 0.1 % Apply to affected areas 4 times daily until clear 80 g 0   No current facility-administered medications for this visit.    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 - TUMOR INVOLVES OVARY SURFACE AND LEFT FALLOPIAN TUBE. - SEE ONCOLOGY TABLE. 2. Mesentery, small bowel mesentery biopsy #1 - BENIGN FIBROADIPOSE TISSUE. 3. Ovary and fallopian tube, right - BENIGN OVARY WITH INCLUSION CYSTS. - BENIGN FALLOPIAN TUBE WITH PARATUBAL CYSTS AND ADENOFIBROMA. 4. Omentum, resection for tumor - BENIGN ADIPOSE TISSUE. 5. Peritoneum, biopsy, left diaphragmatic - BENIGN PERITONEAL TYPE TISSUE. 6. Peritoneum, biopsy, right diaphragmatic - BENIGN PERITONEAL TYPE TISSUE. 7. Mesentery, small bowel mesenteric biopsy #2 - BENIGN FIBROADIPOSE TISSUE. 8. Lymph nodes, regional resection, right pelvic - FIVE OF FIVE LYMPH NODES NEGATIVE FOR CARCINOMA (0/5). 9. Lymph nodes, regional resection, left pelvic - FOUR OF FOUR LYMPH NODES NEGATIVE FOR CARCINOMA (0/4). 10. Peritoneum, biopsy, left gutter - BENIGN PERITONEAL TYPE TISSUE. 11. Peritoneum, biopsy, bladder - BENIGN PERITONEAL TYPE TISSUE WITH ACUTE INFLAMMATION AND CALCIFICATIONS.  12. Peritoneum, biopsy, cul-de-sac - BENIGN PERITONEAL TYPE TISSUE WITH ACUTE INFLAMMATION AND CALCIFICATIONS. 13. Soft tissue, biopsy, right gutter - BENIGN FIBROADIPOSE TISSUE. 14. Lymph node, biopsy, right para-aortic - TWO OF TWO LYMPH NODES NEGATIVE FOR CARCINOMA  (0/2). 15. Lymph node, biopsy, left para-aortic - ONE OF ONE LYMPH NODES NEGATIVE FOR CARCINOMA (0/1). Microscopic Comment 1. OVARY Specimen(s): Left ovary and fallopian tube. Procedure: (including lymph node sampling): Bilateral salpingo-oophorectomy with omental and peritoneal biopsies and lymph node biopsies. Primary tumor site (including laterality): Left ovary. Ovarian surface involvement: Present. Ovarian capsule intact without fragmentation: Intact. Maximum tumor size (cm): 11 cm total. Histologic type: Serous cystadenocarcinoma. Grade: High grade (low grade areas also present). Peritoneal implants: (specify invasive or non-invasive): N/A. Pelvic extension (list additional structures on separate lines and if involved): Left fallopian tube. Lymph nodes: number examined 12 ; number positive 0 TNM code: pT2a, pN0, pMX FIGO Stage (based on pathologic findings, needs clinical correlation): IIA Comments: None.   01/31/2018 Pathology Results   PERITONEAL WASHING (SPECIMEN 1 OF 1 COLLECTED 01/31/18): MALIGNANT CELLS PRESENT CONSISTENT WITH METASTATIC CARCINOMA.   01/31/2018 Surgery   Procedure(s) Performed:  1. Robotic BSO and washings. 2. Exploratory laparotomy, Staging including infragastric omentectomy, Bilateral pelvic and paraaortic lymphadenectomy, pertioneal biopsies.    Specimens: Bilateral tubes / ovaries, bilateral pelvic and paraaortic lymph nodes, peritoneal biopsies, washings and omentum.  Operative Findings: The left adnexa was adherent to the left pelvic peritoneum suspected due to the inferior retraction from the vaginal hysterectomy. Intraoperative leakage of cystic fluid from left ovary.  Frozen section revealed high-grade carcinoma with  surface involvement of the left adnexa.  The right adnexa grossly appeared normal although slightly enlarged for her age.  There were some indurated lymph nodes but no overtly malignant lymph nodes were suspected.  Small bowel mesentery  had 3-4 superficial possible implants.  1 of these was sent for frozen section returned benign.  She did have adhesive disease from her open cholecystectomy in the right upper quadrant.  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 16 mm exophytic lesion upper pole right kidney has attenuation too high to be a simple cyst. This may be a cyst complicated by proteinaceous debris or hemorrhage, but renal cell carcinoma is a concern. Routine outpatient follow-up MRI of the abdomen without and with contrast recommended after resolution of patient's acute symptoms (so she is better able to participate with positioning and breath holding). 2. Mild edema/possible minimal hemorrhage in the extraperitoneal soft tissues of the pelvic for tracking up both pelvic sidewalls. Imaging appearance is not outside of the spectrum of findings expected 6 days after the reported surgery. Trace interloop mesenteric and free fluid is identified in the peritoneal cavity in there is some mild edema in the omentum. There is no organized or rim enhancing collection to suggest evolving abscess. 3. Gas in the extraperitoneal soft tissues of the left lower quadrant is associated with gas in the subcutaneous fat of the lower left anterior abdominal wall. This is not unexpected on postoperative day 6. No evidence for intraperitoneal free air. 4. No CT features to suggest small bowel obstruction. Oral contrast material has migrated about halfway through the small bowel loops but there is no differential distention of proximal versus distal small  bowel. The colon is diffusely distended and fluid-filled proximally, but formed stool is noted in the distal colon. Given apparent slow migration of contrast and fluid-filled small and large bowel loops, a component of ileus is possible.   02/14/2018 Imaging   MR abdomen Benign Bosniak category 2 cyst in upper pole of right kidney, corresponding with lesion seen on previous CT. No evidence of renal neoplasm or other significant abnormality.     02/18/2018 Cancer Staging   Staging form: Ovary, Fallopian Tube, and Primary Peritoneal Carcinoma, AJCC 8th Edition - Pathologic: Stage II (pT2, pN0, cM0) - Signed by Artis Delay, MD on 02/18/2018   02/28/2018 Procedure   Status post right IJ port catheter placement. 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 For last cycle, taxol was omitted   07/18/2018 Imaging   1. No evidence for residual or recurrent tumor within the abdomen or pelvis. There has been interval resolution edema and fluid within the peritoneal cavity. 2. New pulmonary nodule is identified within the right lower lobe measuring 1.1 cm, image 87/4. Consider one of the following in 3 months for both low-risk and high-risk individuals: (a) repeat chest CT, (  b) follow-up PET-CT, or (c) tissue sampling. This recommendation follows the consensus statement: Guidelines for Management of Incidental Pulmonary Nodules Detected on CT Images: From the Fleischner Society 2017; Radiology 2017; 284:228-243. 3. Stable 7 mm nodule within the right upper lobe. 4.  Aortic Atherosclerosis (ICD10-I70.0). 5. Multi vessel coronary artery atherosclerotic calcifications.   07/18/2018 Tumor Marker   Patient's tumor was tested for the following markers: CA-125 Results of the  tumor marker test revealed 14.9   07/25/2018 PET scan   1. Regressing right lower lobe peripheral lung lesion, likely resolving inflammatory or atelectatic process. No hypermetabolism to suggest malignancy. I would recommend a follow-up noncontrast chest CT and 4-6 months. Small upper lobe pulmonary nodules are stable. 2. 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   07/31/2018 -  Chemotherapy   The patient started taking rucaparib   10/25/2018 Tumor Marker   Patient's tumor was tested for the following markers: CA-125 Results of the tumor marker test revealed 12.1   11/25/2018 Imaging   1. Right lower lobe nodule has resolved. Additional right lung nodules are stable. 2. Stable hazy nodularity along the left external iliac chain.  3. Hepatomegaly. 4. Aortic atherosclerosis (ICD10-170.0). Coronary artery calcification. 5. Enlarged pulmonic trunk, indicative of pulmonary arterial hypertension.   01/16/2019 Tumor Marker   Patient's tumor was tested for the following markers: CA-125 Results of the tumor marker test revealed 13.1   05/22/2019 Tumor Marker   Patient's tumor was tested for the following markers: CA-125 Results of the tumor marker test revealed 14.4   06/24/2019 Imaging   Ct chest, abdomen and pelvis 1. There are multiple clustered tiny centrilobular nodules and irregular opacities of the bilateral lower lobes and lingula, increased compared to prior examination, with underlying bandlike scarring of the medial right middle lobe and right lung base (e.g. series 4, image 100, 110, 123). 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  2. Stable 6 mm pulmonary nodule of the right upper lobe (series 4, image 45). Attention on follow-up.   3. Status post hysterectomy and oophorectomy. No evidence of recurrent or metastatic disease in the abdomen  or pelvis.   4. There are occasional sigmoid diverticula and focal fat stranding about a diverticulum of the mid sigmoid (series 2, image 103). Correlate for signs and symptoms of acute diverticulitis.   5. Other chronic, incidental, and postoperative findings as detailed above.   06/24/2019 Tumor Marker   Patient's tumor was tested for the following markers: CA-125 Results of the tumor marker test revealed 14.4.   08/19/2019 Tumor Marker   Patient's tumor was tested for the following markers: CA-125 Results of the tumor marker test revealed 10.3   09/25/2019 Imaging   CT chest 1. Interval resolution of previously demonstrated clustered centrilobular nodularity at both lung bases consistent with resolved inflammation. Stable 6 mm right upper lobe nodule consistent with a benign finding. No new or enlarging pulmonary nodules.  2. Stable chronic bronchiectasis and scarring in the right middle lobe and lingula. 3. No evidence of metastatic disease. 4. Aortic Atherosclerosis (ICD10-I70.0).   10/30/2019 Tumor Marker   Patient's tumor was tested for the following markers: CA-125 Results of the tumor marker test revealed 12.1   12/25/2019 Tumor Marker   Patient's tumor was tested for the following markers: CA-125. Results of the tumor marker test revealed 14.8.   02/19/2020 Tumor Marker   Patient's tumor was tested for the  following markers: CA-125. Results of the tumor marker test revealed 15.5   04/22/2020 Tumor Marker   Patient's tumor was tested for the following markers: CA-125 Results of the tumor marker test revealed 8.9   06/16/2020 Tumor Marker   Patient's tumor was tested for the following markers: CA-125 Results of the tumor marker test revealed 12.5   06/23/2020 Imaging   1. Stable exam. No new or progressive findings to suggest recurrent or metastatic disease. 2. Aortic Atherosclerosis (ICD10-I70.0).   08/19/2020 Tumor Marker   Patient's tumor was tested for the following  markers: CA-125 Results of the tumor marker test revealed 19.1   09/21/2020 Tumor Marker   Patient's tumor was tested for the following markers: CA-125 Results of the tumor marker test revealed 12.7.   11/17/2020 Tumor Marker   Patient's tumor was tested for the following markers: CA-125 Results of the tumor marker test revealed 14.7   01/11/2021 Tumor Marker   Patient's tumor was tested for the following markers: CA-125 Results of the tumor marker test revealed 11.6   02/24/2021 Tumor Marker   Patient's tumor was tested for the following markers: CA125. Results of the tumor marker test revealed 11.8   04/21/2021 Tumor Marker   Patient's tumor was tested for the following markers: CA-125. Results of the tumor marker test revealed 10.9.   06/24/2021 Tumor Marker   Patient's tumor was tested for the following markers: CA-125. Results of the tumor marker test revealed 12.9.   08/11/2021 Tumor Marker   Patient's tumor was tested for the following markers: CA-125. Results of the tumor marker test revealed 12.6.   11/30/2021 Tumor Marker   Patient's tumor was tested for the following markers: CA-125. Results of the tumor marker test revealed 13.0.   01/02/2022 Tumor Marker   Patient's tumor was tested for the following markers: CA-125. Results of the tumor marker test revealed 11.   01/05/2022 Imaging   1. Non complicated descending and sigmoid diverticulitis. 2. No evidence of metastatic disease within the abdomen or pelvis. 3.  Aortic Atherosclerosis (ICD10-I70.0). 4. Upper pole right renal lesion demonstrates complexity, but was characterized as a complex cyst on 02/14/2018 abdominal MRI.     03/20/2022 Tumor Marker   Patient's tumor was tested for the following markers: CA-125. Results of the tumor marker test revealed 12.5.   03/28/2022 Procedure   Removal of implanted Port-A-Cath utilizing sharp and blunt dissection. The procedure was uncomplicated.   06/22/2022 Tumor Marker    Patient's tumor was tested for the following markers: CA-125. Results of the tumor marker test revealed 12.9.   09/21/2022 Tumor Marker   Patient's tumor was tested for the following markers: CA-125. Results of the tumor marker test revealed 15.6.   12/18/2022 Tumor Marker   Patient's tumor was tested for the following markers: CA-125. Results of the tumor marker test revealed 14.1.   12/18/2022 Imaging   No evidence of recurrent or metastatic carcinoma within the abdomen or pelvis.   Sigmoid diverticulosis. No radiographic evidence of diverticulitis.   Aortic Atherosclerosis (ICD10-I70.0).     PHYSICAL EXAMINATION: ECOG PERFORMANCE STATUS: 1 - Symptomatic but completely ambulatory  Vitals:   03/22/23 0926  BP: 133/62  Pulse: 61  Resp: 18  Temp: 97.7 F (36.5 C)  SpO2: 100%   Filed Weights   03/22/23 0926  Weight: 164 lb 9.6 oz (74.7 kg)    GENERAL:alert, no distress and comfortable NEURO: alert & oriented x 3 with fluent speech, no focal motor/sensory deficits  LABORATORY  DATA:  I have reviewed the data as listed    Component Value Date/Time   NA 139 03/22/2023 0855   K 4.2 03/22/2023 0855   CL 107 03/22/2023 0855   CO2 27 03/22/2023 0855   GLUCOSE 105 (H) 03/22/2023 0855   BUN 23 03/22/2023 0855   CREATININE 0.94 03/22/2023 0855   CREATININE 0.85 12/15/2022 0912   CALCIUM 9.4 03/22/2023 0855   PROT 6.6 03/22/2023 0855   ALBUMIN 4.0 03/22/2023 0855   AST 18 03/22/2023 0855   AST 23 12/15/2022 0912   ALT 15 03/22/2023 0855   ALT 25 12/15/2022 0912   ALKPHOS 66 03/22/2023 0855   BILITOT 0.7 03/22/2023 0855   BILITOT 0.5 12/15/2022 0912   GFRNONAA >60 03/22/2023 0855   GFRNONAA >60 12/15/2022 0912   GFRAA >60 06/16/2020 0918   GFRAA >60 02/13/2019 1024    No results found for: "SPEP", "UPEP"  Lab Results  Component Value Date   WBC 7.4 03/22/2023   NEUTROABS 4.6 03/22/2023   HGB 12.8 03/22/2023   HCT 39.0 03/22/2023   MCV 96.8 03/22/2023   PLT 302  03/22/2023      Chemistry      Component Value Date/Time   NA 139 03/22/2023 0855   K 4.2 03/22/2023 0855   CL 107 03/22/2023 0855   CO2 27 03/22/2023 0855   BUN 23 03/22/2023 0855   CREATININE 0.94 03/22/2023 0855   CREATININE 0.85 12/15/2022 0912      Component Value Date/Time   CALCIUM 9.4 03/22/2023 0855   ALKPHOS 66 03/22/2023 0855   AST 18 03/22/2023 0855   AST 23 12/15/2022 0912   ALT 15 03/22/2023 0855   ALT 25 12/15/2022 0912   BILITOT 0.7 03/22/2023 0855   BILITOT 0.5 12/15/2022 0912

## 2023-03-22 NOTE — Assessment & Plan Note (Signed)
Per patient request, we have drawn repeat TSH and free thyroxine

## 2023-03-22 NOTE — Assessment & Plan Note (Signed)
Her last imaging study which showed no evidence of disease Her tumor marker is pending We discussed future follow-up I plan to see her again in 4 months for further follow-up

## 2023-03-22 NOTE — Assessment & Plan Note (Signed)
She had recent recurrent falls She has severe right shoulder pain requiring surgery, awaiting cardiology clearance Continue conservative management We discussed importance of changes in home environment to reduce risk of recurrent falls

## 2023-03-24 LAB — T3: T3, Total: 82 ng/dL (ref 71–180)

## 2023-03-24 LAB — CA 125: Cancer Antigen (CA) 125: 13.8 U/mL (ref 0.0–38.1)

## 2023-03-28 ENCOUNTER — Encounter: Payer: Self-pay | Admitting: Cardiology

## 2023-03-28 ENCOUNTER — Other Ambulatory Visit: Payer: Self-pay | Admitting: Cardiology

## 2023-03-28 ENCOUNTER — Ambulatory Visit: Payer: Medicare HMO | Attending: Cardiology | Admitting: Cardiology

## 2023-03-28 VITALS — BP 142/84 | HR 58 | Ht 65.0 in | Wt 166.0 lb

## 2023-03-28 DIAGNOSIS — I272 Pulmonary hypertension, unspecified: Secondary | ICD-10-CM | POA: Diagnosis not present

## 2023-03-28 DIAGNOSIS — Z01818 Encounter for other preprocedural examination: Secondary | ICD-10-CM

## 2023-03-28 DIAGNOSIS — I10 Essential (primary) hypertension: Secondary | ICD-10-CM

## 2023-03-28 DIAGNOSIS — C562 Malignant neoplasm of left ovary: Secondary | ICD-10-CM

## 2023-03-28 DIAGNOSIS — E1151 Type 2 diabetes mellitus with diabetic peripheral angiopathy without gangrene: Secondary | ICD-10-CM

## 2023-03-28 DIAGNOSIS — I2584 Coronary atherosclerosis due to calcified coronary lesion: Secondary | ICD-10-CM

## 2023-03-28 DIAGNOSIS — I2510 Atherosclerotic heart disease of native coronary artery without angina pectoris: Secondary | ICD-10-CM

## 2023-03-28 DIAGNOSIS — Z7984 Long term (current) use of oral hypoglycemic drugs: Secondary | ICD-10-CM

## 2023-03-28 NOTE — Patient Instructions (Signed)
Medication Instructions:  Your physician recommends that you continue on your current medications as directed. Please refer to the Current Medication list given to you today.  *If you need a refill on your cardiac medications before your next appointment, please call your pharmacy*   Lab Work: None If you have labs (blood work) drawn today and your tests are completely normal, you will receive your results only by: MyChart Message (if you have MyChart) OR A paper copy in the mail If you have any lab test that is abnormal or we need to change your treatment, we will call you to review the results.   Testing/Procedures:   [facility] [address] [place], [place] [address] Phone:  [phone]    Please arrive 15 minutes prior to your appointment time for registration and insurance purposes.  The test will take approximately 3 to 4 hours to complete; you may bring reading material.  If someone comes with you to your appointment, they will need to remain in the main lobby due to limited space in the testing area. **If you are pregnant or breastfeeding, please notify the nuclear lab prior to your appointment**  How to prepare for your Myocardial Perfusion Test: Do not eat or drink 3 hours prior to your test, except you may have water. Do not consume products containing caffeine (regular or decaffeinated) 12 hours prior to your test. (ex: coffee, chocolate, sodas, tea). Do bring a list of your current medications with you.  If not listed below, you may take your medications as normal. HOLD diabetic medication/insulin the morning of the test: Glucophage Do wear comfortable clothes (no dresses or overalls) and walking shoes, tennis shoes preferred (No heels or open toe shoes are allowed). Do NOT wear cologne, perfume, aftershave, or lotions (deodorant is allowed). If these instructions are not followed, your test will have to be rescheduled.  Please report to  [address] for your test.  If you have questions or concerns about your appointment, you can call the [facility] at [phone].  If you cannot keep your appointment, please provide 24 hours notification to the Nuclear Lab, to avoid a possible $50 charge to your account.  Your physician has requested that you have an echocardiogram. Echocardiography is a painless test that uses sound waves to create images of your heart. It provides your doctor with information about the size and shape of your heart and how well your heart's chambers and valves are working. This procedure takes approximately one hour. There are no restrictions for this procedure. Please do NOT wear cologne, perfume, aftershave, or lotions (deodorant is allowed). Please arrive 15 minutes prior to your appointment time.    Follow-Up: At [facility], you and your health needs are our priority.  As part of our continuing mission to provide you with exceptional heart care, we have created designated Provider Care Teams.  These Care Teams include your primary Cardiologist (physician) and Advanced Practice Providers (APPs -  Physician Assistants and Nurse Practitioners) who all work together to provide you with the care you need, when you need it.  We recommend signing up for the patient portal called "MyChart".  Sign up information is provided on this After Visit Summary.  MyChart is used to connect with patients for Virtual Visits (Telemedicine).  Patients are able to view lab/test results, encounter notes, upcoming appointments, etc.  Non-urgent messages can be sent to your provider as well.  To learn more about what you can do with MyChart, go to [email or web address].    Your next appointment:   3 month(s)  Provider:   Gypsy Balsam, MD    Other Instructions None

## 2023-03-28 NOTE — Progress Notes (Signed)
Cardiology Consultation:    Date:  03/28/2023   ID:  [name], DOB [birth year], MRN [id number]  PCP:  Gordan Payment., MD  Cardiologist:  Gypsy Balsam, MD   Referring MD: Gordan Payment., MD   Chief Complaint  Patient presents with   CLearance TBD    History of Present Illness:    [name] is a 82 y.o. female who is being seen today for the evaluation of cardiovascular preop evaluation at the request of Gordan Payment., MD. she is a very sweet lady who lives in [place] with past medical history significant for diabetes which is very well-controlled, dyslipidemia with last LDL of 97, on statin, essential hypertension, she never smoked recently she fell down and sustained fracture of the right shoulder shoulder replacement surgery is contemplated, she was sent to [place] for evaluation before surgery.  She did have a CT of her chest done a while ago which showed calcification of the aorta as well as coronary arteries she denies have any chest pain tightness squeezing pressure burning changes she appears to be very active still works in the garden she planted more than 90 tomato plants this year.  Never have any heart trouble.  There is no swelling of lower extremities she does have multiple orthopedic limitations which make me think that her ability to exercise is not as good as she thinks.  Past Medical History:  Diagnosis Date   Acquired hypothyroidism 12/13/2015   Last Assessment & Plan:   Relevant Hx:  Course:  Daily Update:  Today's Plan:update her TSH for her on her current dose of med     Electronically signed by: Krystal Clark, NP  01/11/16 [phone]   Acute diverticulitis 01/06/2022   Anemia due to antineoplastic chemotherapy 08/19/2019   Arthritis    Atherosclerosis 12/13/2015   Axillary lymphadenopathy 11/04/2018   Bilateral thumb pain 12/04/2022   Breast pain, right 11/04/2018   Bronchiectasis without complication (HCC) 07/08/2019   Bruising 09/22/2020    Change in nail appearance 02/03/2019   Formatting of this note might be different from the original. Recommend review with Onc and then Derm if not clear. Formatting of this note might be different from the original. Recommend review with Onc and then Derm if not clear.   Chemotherapy adverse reaction 03/11/2018   Chronic back pain greater than 3 months duration 09/24/2018   Chronic idiopathic constipation 03/13/2018   Chronic left shoulder pain 01/11/2021   Coronary artery calcification 11/28/2018   Formatting of this note might be different from the original. Seen on CT of chest 11/2018 Formatting of this note might be different from the original. 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) 12/13/2015   Last Assessment & Plan:   Relevant Hx:  Course:  Daily Update:  Today's Plan:we had a long discussion about med choices, she had been given onglyza samples when I saw her briefly in the lobby about 2 weeks ago to hold till she came back and did her labs, we discussed med choices with victoza and byetta which worked better for her though she could not afford them, discussed the newer meds farxiga a   Diabetes mellitus without complication (HCC)    type 2    Elevated liver function tests 02/06/2018   Essential hypertension 12/13/2015   Last Assessment & Plan:   Relevant Hx:  Course:  Daily Update:  Today's Plan:this is stable for her and will continue to follow this for her     Electronically signed by: Krystal Clark, NP  01/11/16 [phone]   Genetic testing 06/03/2018   TumorNext HRD + CancerNext germline testing was ordered.     The CancerNext gene panel offered by 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   Intractable pain    Left lower quadrant abdominal pain 01/02/2022   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   Lumbar disc herniation 02/09/2021   Lumbosacral radiculopathy    Major depressive disorder, 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/13/2015   Malaise and fatigue 12/13/2015   Last Assessment & Plan:   Relevant Hx:  Course:  Daily Update:  Today's Plan:she is walking about 4-5 days per week and going to the gym and has gotten up to speed 4 on a level 3 , she feels her energy drain though is more sugar related and that is bothersome to her     Electronically signed by: Krystal Clark, NP  01/11/16 0913   Malignant neoplasm of left ovary (HCC)    Mixed hyperlipidemia 12/13/2015   Last Assessment & Plan:   Relevant Hx:  Course:  Daily Update:  Today's Plan:update her lipids for her today      Electronically signed by: Krystal Clark, NP  01/11/16 [phone]   Multiple lung nodules on CT 07/25/2018   Mycobacteria, 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 Probably related to to crestor.  Avoid statins. Formatting of this note might be different from the original. Probably related to to crestor.  Avoid statins.   Osteoarthritis of knee 12/04/2022   Other constipation 03/01/2018   Other  fatigue 11/11/2018   Ovarian cancer (HCC) 01/31/2018   Pancytopenia, acquired (HCC) 10/25/2018   Physical debility 07/25/2018   Pneumonia    its been 4 years    PONV (postoperative nausea and vomiting)    Post-menopause 02/21/2016   Formatting of this note might be different from the original. Nl dexa 07/2020. Repeat 2025. Formatting of this note might be different from the original. Nl dexa 07/2020. Repeat 2025.   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 Seen on most recent CT scan done by oncology 11/2018 with enlarged pulmonary artery Formatting of this note might be different from the original. 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 deficiency    Vitamin B12 deficiency 12/13/2015   Weight loss 04/22/2020    Past Surgical History:  Procedure Laterality Date   BLADDER SUSPENSION  2002   BREAST SURGERY     several benign cysts removed; surgeries from 1975- 1990    CHOLECYSTECTOMY  1988   DECOMPRESSIVE LUMBAR LAMINECTOMY LEVEL 1 N/A 02/09/2021   Procedure: DECOMPRESSIVE LUMBAR LAMINECTOMY LEVEL 1 lumbar three-four;  Surgeon: Venita Lick, MD;  Location: [facility] OR;  Service: Orthopedics;  Laterality: N/A;   EYE SURGERY  2010 amd 2012   cataracts removed bilateral    IR FLUORO GUIDE PORT INSERTION RIGHT  02/28/2018   IR REMOVAL TUN ACCESS W/ PORT W/O FL MOD SED  03/28/2022   IR US GUIDE VASC ACCESS RIGHT  02/28/2018   KNEE ARTHROSCOPY Left    l knee x2   KYPHOPLASTY N/A 02/09/2021   Procedure: KYPHOPLASTY lumbar four;  Surgeon: Venita Lick, MD;  Location: [facility] OR;  Service:  Orthopedics;  Laterality: N/A;   PARATHYROIDECTOMY  2010   ROBOTIC ASSISTED SALPINGO OOPHERECTOMY Bilateral 01/31/2018   Procedure: XI ROBOTIC ASSISTED BILATERAL SALPINGO OOPHORECTOMY, STAGING, LAPAROTOMY, PELVIC AND PARA AORTIC LYMPH NODE DISSECTION, OMENTECTOMY;  Surgeon: Shonna Chock, MD;  Location: [facility] ORS;  Service: Gynecology;  Laterality: Bilateral;   ROTATOR CUFF REPAIR  2005   right    TOTAL KNEE ARTHROPLASTY Left 12/08/2014   Procedure: LEFT TOTAL KNEE ARTHROPLASTY;  Surgeon: Jacki Cones, MD;  Location: [facility] ORS;  Service: Orthopedics;  Laterality: Left;   VAGINAL HYSTERECTOMY  1984    Current Medications: Current Meds  Medication Sig   ALPRAZolam (XANAX) 0.5 MG tablet Take 0.5 mg by mouth at bedtime as needed for sleep.   amLODipine (NORVASC) 5 MG tablet Take 5 mg by mouth daily.   atorvastatin (LIPITOR) 10 MG tablet Take 10 mg by mouth daily.   cholecalciferol (VITAMIN D3) 25 MCG (1000 UNIT) tablet Take 2,000 Units by mouth daily.   Cyanocobalamin 1000 MCG/ML KIT Inject 1,000 mcg as directed every 14 (fourteen) days.   FLUoxetine (PROZAC) 20 MG capsule Take 20 mg by mouth daily after breakfast.   hydrochlorothiazide (HYDRODIURIL) 25 MG tablet Take 25 mg by mouth daily.   levothyroxine (SYNTHROID) 88 MCG tablet Take 1 tablet by mouth daily.   linaclotide (LINZESS) 145 MCG CAPS capsule Take 145 mcg by mouth daily before breakfast.   losartan (COZAAR) 100 MG tablet Take 100 mg by mouth at bedtime.    Magnesium 400 MG TABS Take 1 tablet by mouth daily.   Melatonin 5 MG TABS Take 5-10 mg by mouth at bedtime as needed (sleep).    metFORMIN (GLUCOPHAGE-XR) 500 MG 24 hr tablet Take 1,000 mg by mouth 2 (two) times daily.    montelukast (SINGULAIR) 10 MG tablet Take 1 tablet (10 mg total) by mouth at bedtime.   [DISCONTINUED] calcium carbonate (TUMS) 500 MG chewable tablet Chew 1 tablet (200 mg of elemental calcium total) by mouth daily.   [DISCONTINUED] famotidine (PEPCID) 40 MG  tablet Take 40 mg by mouth daily.   [DISCONTINUED] halobetasol (ULTRAVATE) 0.05 % ointment Apply to affected areas as directed twice daily until clear (Patient taking differently: Apply 1 Application topically 2 (two) times daily. Apply to affected areas as directed twice daily until clear)   [DISCONTINUED] triamcinolone ointment (KENALOG) 0.1 % Apply to affected areas  4 times daily until clear (Patient taking differently: Apply 1 Application topically 4 (four) times daily. Apply to affected areas 4 times daily until clear)     Allergies:   Penicillins and Rosuvastatin   Social History   Socioeconomic History   Marital status: Widowed    Spouse name: Not on file   Number of children: 2   Years of education: Not on file   Highest education level: Not on file  Occupational History   Occupation: retired Print production planner  Tobacco Use   Smoking status: Never   Smokeless tobacco: Never  Vaping Use   Vaping Use: Never used  Substance and Sexual Activity   Alcohol use: No   Drug use: No   Sexual activity: Not on file  Other Topics Concern   Not on file  Social History Narrative   Not on file   Social Determinants of Health   Financial Resource Strain: Not on file  Food Insecurity: Not on file  Transportation Needs: Not on file  Physical Activity: Not on file  Stress: Not on file  Social Connections: Not on file     Family History: The patient's family history includes Bladder Cancer (age of onset: 16) in her sister; Breast cancer (age of onset: 97) in an other family member; Esophageal cancer in her maternal uncle; Leukemia in her brother and cousin; Lung cancer in her cousin, cousin, and maternal aunt; Lymphoma (age of onset: 36) in her mother; Other (age of onset: 75) in her maternal grandfather; Prostate cancer (age of onset: 3) in her brother; Prostate cancer (age of onset: 52) in her father. There is no history of Allergic rhinitis, Angioedema, Asthma, Atopy, Eczema,  Immunodeficiency, or Urticaria. ROS:   Please see the history of present illness.    All 14 point review of systems negative except as described per history of present illness.  EKGs/Labs/Other Studies Reviewed:    The following studies were reviewed today:   EKG:  EKG is  ordered today.  The ekg ordered today demonstrates normal sinus rhythm, normal P interval normal QS complex duration morphology no ST segment changes  Recent Labs: 03/22/2023: ALT 15; BUN 23; Creatinine, Ser 0.94; Hemoglobin 12.8; Platelets 302; Potassium 4.2; Sodium 139; TSH 2.049  Recent Lipid Panel No results found for: "CHOL", "TRIG", "HDL", "CHOLHDL", "VLDL", "LDLCALC", "LDLDIRECT"  Physical Exam:    VS:  BP (!) 142/84 (BP Location: Left Arm, Patient Position: Sitting)   Pulse (!) 58   Ht 5\' 5"  (1.651 m)   Wt 166 lb (75.3 kg)   SpO2 97%   BMI 27.62 kg/m     Wt Readings from Last 3 Encounters:  03/28/23 166 lb (75.3 kg)  03/22/23 164 lb 9.6 oz (74.7 kg)  12/19/22 165 lb 6.4 oz (75 kg)     GEN:  Well nourished, well developed in no acute distress HEENT: Normal NECK: No JVD; No carotid bruits LYMPHATICS: No lymphadenopathy CARDIAC: RRR, no murmurs, no rubs, no gallops RESPIRATORY:  Clear to auscultation without rales, wheezing or rhonchi  ABDOMEN: Soft, non-tender, non-distended MUSCULOSKELETAL:  No edema; No deformity  SKIN: Warm and dry NEUROLOGIC:  Alert and oriented x 3 PSYCHIATRIC:  Normal affect   ASSESSMENT:    1. Pre-procedural examination   2. Coronary artery calcification   3. Essential hypertension   4. Pulmonary hypertension (HCC)   5. 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 Malignant neoplasm of left ovary (HCC)    PLAN:  In order of problems listed above:  Cardiovascular preop evaluation for elective right shoulder surgery.  Lady have multiple risk factors for coronary artery disease namely dyslipidemia, essential hypertension, diabetes, likely no  family history of premature coronary artery disease she also got quite extensive calcification of the coronary arteries noted on CT.  Therefore we need to evaluate her for presence of coronary artery disease even though he does not have typical symptomatology.  I will schedule him to have Lexiscan.  She also had history of pulmonary hypertension, echocardiogram will be scheduled to look at left ventricle ejection fraction and degree of pulmonary artery pressure. Essential hypertension blood pressure seems to well-controlled continue present management. Pulmonary hypertension echocardiogram will be done to assess that. Diabetes mellitus that being follow-up excellently by her primary care physician with her latest hemoglobin A1c of 6.4. Dyslipidemia I did review her blood test done by primary care physician, LDL is 97 in the future we will talk about increasing dose of statin to maximize her therapy but a lot will depend on the results of the stress test. If stress test and echocardiogram did not show any significant pathology then should be acceptable to proceed with surgery.   Medication Adjustments/Labs and Tests Ordered: Current medicines are reviewed at length with the patient today.  Concerns regarding medicines are outlined above.  Orders Placed This Encounter  Procedures   EKG 12-Lead   No orders of the defined types were placed in this encounter.   Signed, Georgeanna Lea, MD, [facility]. 03/28/2023 9:23 AM    [facility]

## 2023-03-28 NOTE — Addendum Note (Signed)
Addended by: Roxanne Mins I on: 03/28/2023 09:46 AM   Modules accepted: Orders

## 2023-04-03 DIAGNOSIS — M2141 Flat foot [pes planus] (acquired), right foot: Secondary | ICD-10-CM

## 2023-04-03 HISTORY — DX: Flat foot (pes planus) (acquired), right foot: M21.41

## 2023-04-05 ENCOUNTER — Telehealth (HOSPITAL_COMMUNITY): Payer: Self-pay | Admitting: *Deleted

## 2023-04-05 ENCOUNTER — Ambulatory Visit (HOSPITAL_BASED_OUTPATIENT_CLINIC_OR_DEPARTMENT_OTHER)
Admission: RE | Admit: 2023-04-05 | Discharge: 2023-04-05 | Disposition: A | Discharge status: Home / Self Care | Payer: Medicare HMO | Source: Ambulatory Visit | Attending: Cardiology | Admitting: Cardiology

## 2023-04-05 DIAGNOSIS — I2510 Atherosclerotic heart disease of native coronary artery without angina pectoris: Secondary | ICD-10-CM | POA: Insufficient documentation

## 2023-04-05 DIAGNOSIS — Z01818 Encounter for other preprocedural examination: Secondary | ICD-10-CM | POA: Insufficient documentation

## 2023-04-05 DIAGNOSIS — I2584 Coronary atherosclerosis due to calcified coronary lesion: Secondary | ICD-10-CM | POA: Insufficient documentation

## 2023-04-05 DIAGNOSIS — C562 Malignant neoplasm of left ovary: Secondary | ICD-10-CM | POA: Insufficient documentation

## 2023-04-05 DIAGNOSIS — I272 Pulmonary hypertension, unspecified: Secondary | ICD-10-CM

## 2023-04-05 LAB — ECHOCARDIOGRAM COMPLETE
AR max vel: 2.26 cm2
AV Area VTI: 2.29 cm2
AV Area mean vel: 2.36 cm2
AV Mean grad: 6.5 mmHg
AV Peak grad: 11.9 mmHg
Ao pk vel: 1.73 m/s
Area-P 1/2: 2.55 cm2
Calc EF: 64.7 %
MV M vel: 4.13 m/s
MV Peak grad: 68.2 mmHg
S' Lateral: 2.6 cm
Single Plane A2C EF: 62.1 %
Single Plane A4C EF: 70.7 %

## 2023-04-05 NOTE — Telephone Encounter (Signed)
Patient given detailed instructions per Myocardial Perfusion Study Information Sheet for the test on 04/10/2023 at 8:15. Patient notified to arrive 15 minutes early and that it is imperative to arrive on time for appointment to keep from having the test rescheduled.  If you need to cancel or reschedule your appointment, please call the office within 24 hours of your appointment. . Patient verbalized understanding.[name]

## 2023-04-10 ENCOUNTER — Ambulatory Visit: Payer: Medicare HMO | Attending: Cardiology

## 2023-04-10 DIAGNOSIS — I272 Pulmonary hypertension, unspecified: Secondary | ICD-10-CM

## 2023-04-10 DIAGNOSIS — I2510 Atherosclerotic heart disease of native coronary artery without angina pectoris: Secondary | ICD-10-CM | POA: Diagnosis not present

## 2023-04-10 DIAGNOSIS — I2584 Coronary atherosclerosis due to calcified coronary lesion: Secondary | ICD-10-CM

## 2023-04-10 DIAGNOSIS — Z01818 Encounter for other preprocedural examination: Secondary | ICD-10-CM | POA: Diagnosis not present

## 2023-04-10 DIAGNOSIS — C562 Malignant neoplasm of left ovary: Secondary | ICD-10-CM

## 2023-04-10 DIAGNOSIS — I10 Essential (primary) hypertension: Secondary | ICD-10-CM

## 2023-04-10 DIAGNOSIS — Z7984 Long term (current) use of oral hypoglycemic drugs: Secondary | ICD-10-CM

## 2023-04-10 DIAGNOSIS — E1151 Type 2 diabetes mellitus with diabetic peripheral angiopathy without gangrene: Secondary | ICD-10-CM

## 2023-04-10 LAB — MYOCARDIAL PERFUSION IMAGING
LV dias vol: 65 mL (ref 46–106)
LV sys vol: 18 mL
Nuc Stress EF: 72 %
Peak HR: 74 {beats}/min
Rest HR: 59 {beats}/min
Rest Nuclear Isotope Dose: 11 mCi
SDS: 1
SRS: 1
SSS: 2
ST Depression (mm): 0 mm
Stress Nuclear Isotope Dose: 28.7 mCi
TID: 0.91

## 2023-04-10 MED ORDER — REGADENOSON 0.4 MG/5ML IV SOLN
0.4000 mg | Freq: Once | INTRAVENOUS | Status: AC
Start: 2023-04-10 — End: 2023-04-10
  Administered 2023-04-10: 0.4 mg via INTRAVENOUS

## 2023-04-10 MED ORDER — TECHNETIUM TC 99M TETROFOSMIN IV KIT
28.7000 | PACK | Freq: Once | INTRAVENOUS | Status: AC | PRN
  Administered 2023-04-10: 28.7 via INTRAVENOUS

## 2023-04-10 MED ORDER — TECHNETIUM TC 99M TETROFOSMIN IV KIT
11.0000 | PACK | Freq: Once | INTRAVENOUS | Status: AC | PRN
  Administered 2023-04-10: 11 via INTRAVENOUS

## 2023-04-11 ENCOUNTER — Telehealth: Payer: Self-pay | Admitting: *Deleted

## 2023-04-11 NOTE — Telephone Encounter (Signed)
   Patient Name: [name]  DOB: 1941-10-16 MRN: [id number]  Primary Cardiologist: None  Chart reviewed as part of pre-operative protocol coverage. Given past medical history and time since last visit.  Patient was seen by primary cardiologist and advised to undergo 2D echo and Lexiscan prior to surgical clearance being completed.  Patient completed both procedures with normal results and is fine to proceed with scheduled procedure without further cardiac testing at this time.  The patient was advised that if she develops new symptoms prior to surgery to contact our office to arrange for a follow-up visit, and she verbalized understanding.  I will route this recommendation to the requesting party via Epic fax function and remove from pre-op pool.  Please call with questions.  Napoleon Form, Leodis Rains, NP 04/11/2023, 11:32 AM

## 2023-04-11 NOTE — Telephone Encounter (Signed)
   Pre-operative Risk Assessment    Patient Name: [name]  DOB: [birth year] MRN: [id number]      Request for Surgical Clearance    Procedure:   RIGHT SHOULDER ARTHROPLASTY  Date of Surgery:  Clearance TBD                                 Surgeon:  DR. Maryjean Ka Surgeon's Group or Practice Name:  [facility] Phone number:  [phone] ATTN: Aida Raider Fax number:  [phone]   Type of Clearance Requested:   - Medical ; NO MEDICATIONS LISTED AS NEEDING TO BE HELD   Type of Anesthesia:  General    Additional requests/questions:    [name]   04/11/2023, 10:34 AM

## 2023-04-13 ENCOUNTER — Telehealth: Payer: Self-pay

## 2023-04-13 NOTE — Telephone Encounter (Signed)
-----   Message from Georgeanna Lea, MD sent at 04/13/2023  9:41 AM EDT ----- Stress test is normal

## 2023-04-13 NOTE — Progress Notes (Signed)
Sent message, via epic in basket, requesting orders in epic from surgeon.  

## 2023-04-13 NOTE — Telephone Encounter (Signed)
LM to return my call. 

## 2023-04-16 NOTE — Patient Instructions (Signed)
SURGICAL WAITING ROOM VISITATION  Patients having surgery or a procedure may have no more than 2 support people in the waiting area - these visitors may rotate.    Children under the age of 64 must have an adult with them who is not the patient.  Due to an increase in RSV and influenza rates and associated hospitalizations, children ages 46 and under may not visit patients in [facility] hospitals.  If the patient needs to stay at the hospital during part of their recovery, the visitor guidelines for inpatient rooms apply. Pre-op nurse will coordinate an appropriate time for 1 support person to accompany patient in pre-op.  This support person may not rotate.    Please refer to the [facility] website for the visitor guidelines for Inpatients (after your surgery is over and you are in a regular room).    Your procedure is scheduled on: 04/26/23   Report to [facility] Main Entrance    Report to admitting at 5:15 AM   Call this number if you have problems the morning of surgery [phone]   Do not eat food :After Midnight.   After Midnight you may have the following liquids until 4:30 AM DAY OF SURGERY  Water Non-Citrus Juices (without pulp, NO RED-Apple, White grape, White cranberry) Black Coffee (NO MILK/CREAM OR CREAMERS, sugar ok)  Clear Tea (NO MILK/CREAM OR CREAMERS, sugar ok) regular and decaf                             Plain Jell-O (NO RED)                                           Fruit ices (not with fruit pulp, NO RED)                                     Popsicles (NO RED)                                                               Sports drinks like Gatorade (NO RED)    The day of surgery:  Drink ONE (1) Pre-Surgery G2 at 4:30 AM the morning of surgery. Drink in one sitting. Do not sip.  This drink was given to you during your hospital  pre-op appointment visit. Nothing else to drink after completing the  Pre-Surgery G2.          If you have questions,  please contact your surgeon's office.   FOLLOW BOWEL PREP AND ANY ADDITIONAL PRE OP INSTRUCTIONS YOU RECEIVED FROM YOUR SURGEON'S OFFICE!!!     Oral Hygiene is also important to reduce your risk of infection.                                    Remember - BRUSH YOUR TEETH THE MORNING OF SURGERY WITH YOUR REGULAR TOOTHPASTE  DENTURES WILL BE REMOVED PRIOR TO SURGERY PLEASE DO NOT APPLY "Poly grip" OR ADHESIVES!!!  Take these medicines the morning of surgery with A SIP OF WATER: Amlodipine, Atorvastatin, Fluoxetine, Gabapentin, Levothyroxine   DO NOT TAKE ANY ORAL DIABETIC MEDICATIONS DAY OF YOUR SURGERY  How to Manage Your Diabetes Before and After Surgery  Why is it important to control my blood sugar before and after surgery? Improving blood sugar levels before and after surgery helps healing and can limit problems. A way of improving blood sugar control is eating a healthy diet by:  Eating less sugar and carbohydrates  Increasing activity/exercise  Talking with your doctor about reaching your blood sugar goals High blood sugars (greater than 180 mg/dL) can raise your risk of infections and slow your recovery, so you will need to focus on controlling your diabetes during the weeks before surgery. Make sure that the doctor who takes care of your diabetes knows about your planned surgery including the date and location.  How do I manage my blood sugar before surgery? Check your blood sugar at least 4 times a day, starting 2 days before surgery, to make sure that the level is not too high or low. Check your blood sugar the morning of your surgery when you wake up and every 2 hours until you get to the Short Stay unit. If your blood sugar is less than 70 mg/dL, you will need to treat for low blood sugar: Do not take insulin. Treat a low blood sugar (less than 70 mg/dL) with  cup of clear juice (cranberry or apple), 4 glucose tablets, OR glucose gel. Recheck blood sugar in 15 minutes  after treatment (to make sure it is greater than 70 mg/dL). If your blood sugar is not greater than 70 mg/dL on recheck, call [phone] for further instructions. Report your blood sugar to the short stay nurse when you get to Short Stay.  If you are admitted to the hospital after surgery: Your blood sugar will be checked by the staff and you will probably be given insulin after surgery (instead of oral diabetes medicines) to make sure you have good blood sugar levels. The goal for blood sugar control after surgery is 80-180 mg/dL.   WHAT DO I DO ABOUT MY DIABETES MEDICATION?  Do not take oral diabetes medicines (pills) the morning of surgery.  THE DAY BEFORE SURGERY, take Metformin as prescribed.      THE MORNING OF SURGERY, do not take Metformin   Reviewed and Endorsed by [facility] Patient Education Committee, August 2015                              You may not have any metal on your body including hair pins, jewelry, and body piercing             Do not wear make-up, lotions, powders, perfumes, or deodorant  Do not wear nail polish including gel and S&S, artificial/acrylic nails, or any other type of covering on natural nails including finger and toenails. If you have artificial nails, gel coating, etc. that needs to be removed by a nail salon please have this removed prior to surgery or surgery may need to be canceled/ delayed if the surgeon/ anesthesia feels like they are unable to be safely monitored.   Do not shave  48 hours prior to surgery.    Do not bring valuables to the hospital. [facility] IS NOT             RESPONSIBLE   FOR VALUABLES.  Contacts, glasses, dentures or bridgework may not be worn into surgery.  DO NOT BRING YOUR HOME MEDICATIONS TO THE HOSPITAL. PHARMACY WILL DISPENSE MEDICATIONS LISTED ON YOUR MEDICATION LIST TO YOU DURING YOUR ADMISSION IN THE HOSPITAL!    Patients discharged on the day of surgery will not be allowed to drive home.  Someone NEEDS  to stay with you for the first 24 hours after anesthesia.              Please read over the following fact sheets you were given: IF YOU HAVE QUESTIONS ABOUT YOUR PRE-OP INSTRUCTIONS PLEASE CALL [phone]    If you received a COVID test during your pre-op visit  it is requested that you wear a mask when out in public, stay away from anyone that may not be feeling well and notify your surgeon if you develop symptoms. If you test positive for Covid or have been in contact with anyone that has tested positive in the last 10 days please notify you surgeon.      Pre-operative 5 CHG Bath Instructions   You can play a key role in reducing the risk of infection after surgery. Your skin needs to be as free of germs as possible. You can reduce the number of germs on your skin by washing with CHG (chlorhexidine gluconate) soap before surgery. CHG is an antiseptic soap that kills germs and continues to kill germs even after washing.   DO NOT use if you have an allergy to chlorhexidine/CHG or antibacterial soaps. If your skin becomes reddened or irritated, stop using the CHG and notify one of our RNs at [phone].   Please shower with the CHG soap starting 4 days before surgery using the following schedule:     Please keep in mind the following:  DO NOT shave, including legs and underarms, starting the day of your first shower.   You may shave your face at any point before/day of surgery.  Place clean sheets on your bed the day you start using CHG soap. Use a clean washcloth (not used since being washed) for each shower. DO NOT sleep with pets once you start using the CHG.   CHG Shower Instructions:  If you choose to wash your hair and private area, wash first with your normal shampoo/soap.  After you use shampoo/soap, rinse your hair and body thoroughly to remove shampoo/soap residue.  Turn the water OFF and apply about 3 tablespoons (45 ml) of CHG soap to a CLEAN washcloth.  Apply  CHG soap ONLY FROM YOUR NECK DOWN TO YOUR TOES (washing for 3-5 minutes)  DO NOT use CHG soap on face, private areas, open wounds, or sores.  Pay special attention to the area where your surgery is being performed.  If you are having back surgery, having someone wash your back for you may be helpful. Wait 2 minutes after CHG soap is applied, then you may rinse off the CHG soap.  Pat dry with a clean towel  Put on clean clothes/pajamas   If you choose to wear lotion, please use ONLY the CHG-compatible lotions on the back of this paper.     Additional instructions for the day of surgery: DO NOT APPLY any lotions, deodorants, cologne, or perfumes.   Put on clean/comfortable clothes.  Brush your teeth.  Ask your nurse before applying any prescription medications to the skin.      CHG Compatible Lotions   Aveeno Moisturizing lotion  Cetaphil Moisturizing Cream  Cetaphil Moisturizing  Lotion  Clairol Herbal Essence Moisturizing Lotion, Dry Skin  Clairol Herbal Essence Moisturizing Lotion, Extra Dry Skin  Clairol Herbal Essence Moisturizing Lotion, Normal Skin  Curel Age Defying Therapeutic Moisturizing Lotion with Alpha Hydroxy  Curel Extreme Care Body Lotion  Curel Soothing Hands Moisturizing Hand Lotion  Curel Therapeutic Moisturizing Cream, Fragrance-Free  Curel Therapeutic Moisturizing Lotion, Fragrance-Free  Curel Therapeutic Moisturizing Lotion, Original Formula  Eucerin Daily Replenishing Lotion  Eucerin Dry Skin Therapy Plus Alpha Hydroxy Crme  Eucerin Dry Skin Therapy Plus Alpha Hydroxy Lotion  Eucerin Original Crme  Eucerin Original Lotion  Eucerin Plus Crme Eucerin Plus Lotion  Eucerin TriLipid Replenishing Lotion  Keri Anti-Bacterial Hand Lotion  Keri Deep Conditioning Original Lotion Dry Skin Formula Softly Scented  Keri Deep Conditioning Original Lotion, Fragrance Free Sensitive Skin Formula  Keri Lotion Fast Absorbing Fragrance Free Sensitive Skin Formula  Keri  Lotion Fast Absorbing Softly Scented Dry Skin Formula  Keri Original Lotion  Keri Skin Renewal Lotion Keri Silky Smooth Lotion  Keri Silky Smooth Sensitive Skin Lotion  Nivea Body Creamy Conditioning Oil  Nivea Body Extra Enriched Lotion  Nivea Body Original Lotion  Nivea Body Sheer Moisturizing Lotion Nivea Crme  Nivea Skin Firming Lotion  NutraDerm 30 Skin Lotion  NutraDerm Skin Lotion  NutraDerm Therapeutic Skin Cream  NutraDerm Therapeutic Skin Lotion  ProShield Protective Hand Cream  Provon moisturizing lotion   Incentive Spirometer  An incentive spirometer is a tool that can help keep your lungs clear and active. This tool measures how well you are filling your lungs with each breath. Taking long deep breaths may help reverse or decrease the chance of developing breathing (pulmonary) problems (especially infection) following: A long period of time when you are unable to move or be active. BEFORE THE PROCEDURE  If the spirometer includes an indicator to show your best effort, your nurse or respiratory therapist will set it to a desired goal. If possible, sit up straight or lean slightly forward. Try not to slouch. Hold the incentive spirometer in an upright position. INSTRUCTIONS FOR USE  Sit on the edge of your bed if possible, or sit up as far as you can in bed or on a chair. Hold the incentive spirometer in an upright position. Breathe out normally. Place the mouthpiece in your mouth and seal your lips tightly around it. Breathe in slowly and as deeply as possible, raising the piston or the ball toward the top of the column. Hold your breath for 3-5 seconds or for as long as possible. Allow the piston or ball to fall to the bottom of the column. Remove the mouthpiece from your mouth and breathe out normally. Rest for a few seconds and repeat Steps 1 through 7 at least 10 times every 1-2 hours when you are awake. Take your time and take a few normal breaths between deep  breaths. The spirometer may include an indicator to show your best effort. Use the indicator as a goal to work toward during each repetition. After each set of 10 deep breaths, practice coughing to be sure your lungs are clear. If you have an incision (the cut made at the time of surgery), support your incision when coughing by placing a pillow or rolled up towels firmly against it. Once you are able to get out of bed, walk around indoors and cough well. You may stop using the incentive spirometer when instructed by your caregiver.  RISKS AND COMPLICATIONS Take your time so you do not get dizzy  or light-headed. If you are in pain, you may need to take or ask for pain medication before doing incentive spirometry. It is harder to take a deep breath if you are having pain. AFTER USE Rest and breathe slowly and easily. It can be helpful to keep track of a log of your progress. Your caregiver can provide you with a simple table to help with this. If you are using the spirometer at home, follow these instructions: SEEK MEDICAL CARE IF:  You are having difficultly using the spirometer. You have trouble using the spirometer as often as instructed. Your pain medication is not giving enough relief while using the spirometer. You develop fever of 100.5 F (38.1 C) or higher. SEEK IMMEDIATE MEDICAL CARE IF:  You cough up bloody sputum that had not been present before. You develop fever of 102 F (38.9 C) or greater. You develop worsening pain at or near the incision site. MAKE SURE YOU:  Understand these instructions. Will watch your condition. Will get help right away if you are not doing well or get worse. Document Released: 02/12/2007 Document Revised: 12/25/2011 Document Reviewed: 04/15/2007 Exit[employer] Patient Information 2014 [place] [employer].   ________________________________________________________________________  [facility]- Preparing for Total Shoulder Arthroplasty    Before surgery,  you can play an important role. Because skin is not sterile, your skin needs to be as free of germs as possible. You can reduce the number of germs on your skin by using the following products. Benzoyl Peroxide Gel Reduces the number of germs present on the skin Applied twice a day to shoulder area starting two days before surgery    ==================================================================  Please follow these instructions carefully:  BENZOYL PEROXIDE 5% GEL  Please do not use if you have an allergy to benzoyl peroxide.   If your skin becomes reddened/irritated stop using the benzoyl peroxide.  Starting two days before surgery, apply as follows: Apply benzoyl peroxide in the morning and at night. Apply after taking a shower. If you are not taking a shower clean entire shoulder front, back, and side along with the armpit with a clean wet washcloth.  Place a quarter-sized dollop on your shoulder and rub in thoroughly, making sure to cover the front, back, and side of your shoulder, along with the armpit.   2 days before ____ AM   ____ PM              1 day before ____ AM   ____ PM                         Do this twice a day for two days.  (Last application is the night before surgery, AFTER using the CHG soap as described below).  Do NOT apply benzoyl peroxide gel on the day of surgery.

## 2023-04-16 NOTE — Progress Notes (Addendum)
COVID Vaccine Completed: yes  Date of COVID positive in last 90 days:  PCP - Feliciana Rossetti, MD [facility] 04/03/23 Cardiologist - Gypsy Balsam, MD [facility] 03/28/23 for pre op clearance  Oncologist- Artis Delay, MD [facility] 03/22/23  Cardiac clearance by Dr. Bing Matter 03/28/23 in Epic   Chest x-ray -  EKG - 03/28/23 Epic Stress Test - 04/10/23 Epic ECHO - 04/05/23 Epic Cardiac Cath -  Pacemaker/ICD device last checked: Spinal Cord Stimulator:  Bowel Prep -   Sleep Study -  CPAP -   Fasting Blood Sugar -  Checks Blood Sugar _____ times a day  Last dose of GLP1 agonist-  N/A GLP1 instructions:  N/A   Last dose of SGLT-2 inhibitors-  N/A SGLT-2 instructions: N/A   Blood Thinner Instructions:  Time Aspirin Instructions: Last Dose:  Activity level:  Can go up a flight of stairs and perform activities of daily living without stopping and without symptoms of chest pain or shortness of breath.  Able to exercise without symptoms  Unable to go up a flight of stairs without symptoms of     Anesthesia review: atherosclerosis, HTN, DM2,   Patient denies shortness of breath, fever, cough and chest pain at PAT appointment  Patient verbalized understanding of instructions that were given to them at the PAT appointment. Patient was also instructed that they will need to review over the PAT instructions again at home before surgery.

## 2023-04-17 ENCOUNTER — Encounter (HOSPITAL_COMMUNITY)
Admission: RE | Admit: 2023-04-17 | Discharge: 2023-04-17 | Disposition: A | Discharge status: Home / Self Care | Payer: Medicare HMO | Source: Ambulatory Visit | Attending: Orthopedic Surgery | Admitting: Orthopedic Surgery

## 2023-04-17 ENCOUNTER — Encounter (HOSPITAL_COMMUNITY): Payer: Self-pay

## 2023-04-17 ENCOUNTER — Other Ambulatory Visit: Payer: Self-pay

## 2023-04-17 VITALS — BP 124/77 | HR 62 | Temp 98.1°F | Resp 14 | Ht 65.0 in | Wt 164.0 lb

## 2023-04-17 DIAGNOSIS — E119 Type 2 diabetes mellitus without complications: Secondary | ICD-10-CM | POA: Diagnosis not present

## 2023-04-17 DIAGNOSIS — I10 Essential (primary) hypertension: Secondary | ICD-10-CM | POA: Insufficient documentation

## 2023-04-17 DIAGNOSIS — E1151 Type 2 diabetes mellitus with diabetic peripheral angiopathy without gangrene: Secondary | ICD-10-CM | POA: Diagnosis not present

## 2023-04-17 DIAGNOSIS — Z9221 Personal history of antineoplastic chemotherapy: Secondary | ICD-10-CM

## 2023-04-17 DIAGNOSIS — Z01812 Encounter for preprocedural laboratory examination: Secondary | ICD-10-CM | POA: Diagnosis present

## 2023-04-17 DIAGNOSIS — M75101 Unspecified rotator cuff tear or rupture of right shoulder, not specified as traumatic: Secondary | ICD-10-CM | POA: Diagnosis not present

## 2023-04-17 DIAGNOSIS — I340 Nonrheumatic mitral (valve) insufficiency: Secondary | ICD-10-CM | POA: Diagnosis not present

## 2023-04-17 DIAGNOSIS — Z7984 Long term (current) use of oral hypoglycemic drugs: Secondary | ICD-10-CM | POA: Insufficient documentation

## 2023-04-17 DIAGNOSIS — Z01818 Encounter for other preprocedural examination: Secondary | ICD-10-CM

## 2023-04-17 LAB — CBC
HCT: 39.1 % (ref 36.0–46.0)
Hemoglobin: 12.4 g/dL (ref 12.0–15.0)
MCH: 31.6 pg (ref 26.0–34.0)
MCHC: 31.7 g/dL (ref 30.0–36.0)
MCV: 99.7 fL (ref 80.0–100.0)
Platelets: 222 10*3/uL (ref 150–400)
RBC: 3.92 MIL/uL (ref 3.87–5.11)
RDW: 15.2 % (ref 11.5–15.5)
WBC: 6.6 10*3/uL (ref 4.0–10.5)
nRBC: 0 % (ref 0.0–0.2)

## 2023-04-17 LAB — SURGICAL PCR SCREEN
MRSA, PCR: NEGATIVE
Staphylococcus aureus: NEGATIVE

## 2023-04-17 LAB — BASIC METABOLIC PANEL
Anion gap: 8 (ref 5–15)
BUN: 18 mg/dL (ref 8–23)
CO2: 21 mmol/L — ABNORMAL LOW (ref 22–32)
Calcium: 9.1 mg/dL (ref 8.9–10.3)
Chloride: 108 mmol/L (ref 98–111)
Creatinine, Ser: 0.79 mg/dL (ref 0.44–1.00)
GFR, Estimated: >60 mL/min (ref >60)
Glucose, Bld: 124 mg/dL — ABNORMAL HIGH (ref 70–99)
Potassium: 3.6 mmol/L (ref 3.5–5.1)
Sodium: 137 mmol/L (ref 135–145)

## 2023-04-17 LAB — GLUCOSE, CAPILLARY: Glucose-Capillary: 105 mg/dL — ABNORMAL HIGH (ref 70–99)

## 2023-04-18 LAB — HEMOGLOBIN A1C
Hgb A1c MFr Bld: 6.3 % — ABNORMAL HIGH (ref 4.8–5.6)
Mean Plasma Glucose: 134 mg/dL

## 2023-04-18 NOTE — Progress Notes (Signed)
Anesthesia Chart Review   Case: [id number] Date/Time: 04/26/23 0715   Procedure: REVERSE SHOULDER ARTHROPLASTY (Right: Shoulder) -   Anesthesia type: General   Pre-op diagnosis: Right shoulder rotator cuff tear arthropathy   Location: [facility] / [facility]   Surgeons: Francena Hanly, MD       DISCUSSION:82 y.o. never smoker with h/o PONV, HTN, DM II, right shoulder OA scheduled for above procedure 04/26/2023 with Dr. Francena Hanly.   Per cardiology preoperative evaluation 04/11/2023, "Chart reviewed as part of pre-operative protocol coverage. Given past medical history and time since last visit.  Patient was seen by primary cardiologist and advised to undergo 2D echo and Lexiscan prior to surgical clearance being completed.  Patient completed both procedures with normal results and is fine to proceed with scheduled procedure without further cardiac testing at this time. "  VS: BP 124/77   Pulse 62   Temp 36.7 C (Oral)   Resp 14   Ht 5\' 5"  (1.651 m)   Wt 74.4 kg   SpO2 99%   BMI 27.29 kg/m   PROVIDERS: Gordan Payment., MD is PCP   Cardiologist - Gypsy Balsam, MD  LABS: Labs reviewed: Acceptable for surgery. (all labs ordered are listed, but only abnormal results are displayed)  Labs Reviewed  HEMOGLOBIN A1C - Abnormal; Notable for the following components:      Result Value   Hgb A1c MFr Bld 6.3 (*)    All other components within normal limits  BASIC METABOLIC PANEL - Abnormal; Notable for the following components:   CO2 21 (*)    Glucose, Bld 124 (*)    All other components within normal limits  GLUCOSE, CAPILLARY - Abnormal; Notable for the following components:   Glucose-Capillary 105 (*)    All other components within normal limits  SURGICAL PCR SCREEN  CBC     IMAGES:   EKG:   CV: Myocardial Perfusion 04/10/2023   The study is normal. The study is low risk.   No ST deviation was noted.   Left ventricular function is normal. Nuclear stress EF: 72 %.  The left ventricular ejection fraction is hyperdynamic (>65%). End diastolic cavity size is normal.  Echo 04/05/2023 1. Left ventricular ejection fraction, by estimation, is 60 to 65%. The  left ventricle has normal function. The left ventricle has no regional  wall motion abnormalities. Left ventricular diastolic parameters are  consistent with Grade I diastolic  dysfunction (impaired relaxation).   2. Right ventricular systolic function is normal. The right ventricular  size is normal.   3. The mitral valve is normal in structure. Mild mitral valve  regurgitation. No evidence of mitral stenosis.   4. The aortic valve is normal in structure. Aortic valve regurgitation is  not visualized. Aortic valve sclerosis is present, with no evidence of  aortic valve stenosis.   5. The inferior vena cava is normal in size with greater than 50%  respiratory variability, suggesting right atrial pressure of 3 mmHg.   Comparison(s): Echocardiogram done 12/17/18 showed an EF of >70%.  Past Medical History:  Diagnosis Date   Acquired hypothyroidism 12/13/2015   Last Assessment & Plan:   Relevant Hx:  Course:  Daily Update:  Today's Plan:update her TSH for her on her current dose of med     Electronically signed by: Krystal Clark, NP  01/11/16 [phone]   Acute diverticulitis 01/06/2022   Anemia due to antineoplastic chemotherapy 08/19/2019   Arthritis    Atherosclerosis  12/13/2015   Axillary lymphadenopathy 11/04/2018   Bilateral thumb pain 12/04/2022   Breast pain, right 11/04/2018   Bronchiectasis without complication (HCC) 07/08/2019   Bruising 09/22/2020   Change in nail appearance 02/03/2019   Formatting of this note might be different from the original. Recommend review with Onc and then Derm if not clear. Formatting of this note might be different from the original. Recommend review with Onc and then Derm if not clear.   Chemotherapy adverse reaction 03/11/2018   Chronic back pain  greater than 3 months duration 09/24/2018   Chronic idiopathic constipation 03/13/2018   Chronic left shoulder pain 01/11/2021   Coronary artery calcification 11/28/2018   Formatting of this note might be different from the original. Seen on CT of chest 11/2018 Formatting of this note might be different from the original. 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) 12/13/2015   Last Assessment & Plan:   Relevant Hx:  Course:  Daily Update:  Today's Plan:we had a long discussion about med choices, she had been given onglyza samples when I saw her briefly in the lobby about 2 weeks ago to hold till she came back and did her labs, we discussed med choices with victoza and byetta which worked better for her though she could not afford them, discussed the newer meds farxiga a   Diabetes mellitus without complication (HCC)    type 2    Elevated liver function tests 02/06/2018   Essential hypertension 12/13/2015   Last Assessment & Plan:   Relevant Hx:  Course:  Daily Update:  Today's Plan:this is stable for her and will continue to follow this for her     Electronically signed by: Krystal Clark, NP  01/11/16 [phone]   Genetic testing 06/03/2018   TumorNext HRD + CancerNext germline testing was ordered.     The CancerNext gene panel offered by 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   Intractable pain     Left lower quadrant abdominal pain 01/02/2022   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   Lumbar disc herniation 02/09/2021   Lumbosacral radiculopathy    Major depressive disorder, 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/13/2015   Malaise and fatigue 12/13/2015   Last Assessment & Plan:   Relevant Hx:  Course:  Daily Update:  Today's Plan:she is walking about 4-5 days per week and going to the gym and has gotten up to speed 4 on a level 3 , she feels her energy drain though is more sugar related and that is bothersome to her     Electronically signed by: Krystal Clark, NP  01/11/16 0913   Malignant neoplasm of left ovary (HCC)    Mixed hyperlipidemia 12/13/2015   Last Assessment & Plan:   Relevant Hx:  Course:  Daily Update:  Today's Plan:update her lipids for her today      Electronically signed by: Krystal Clark, NP  01/11/16 [phone]   Multiple lung nodules on CT 07/25/2018  Mycobacteria, 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 Probably related to to crestor.  Avoid statins. Formatting of this note might be different from the original. Probably related to to crestor.  Avoid statins.   Osteoarthritis of knee 12/04/2022   Other constipation 03/01/2018   Other fatigue 11/11/2018   Ovarian cancer (HCC) 01/31/2018   Pancytopenia, acquired (HCC) 10/25/2018   Physical debility 07/25/2018   Pneumonia    its been 4 years    PONV (postoperative nausea and vomiting)    Post-menopause 02/21/2016   Formatting of this note might be different from the original. Nl dexa 07/2020. Repeat 2025.  Formatting of this note might be different from the original. Nl dexa 07/2020. Repeat 2025.   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 Seen on most recent CT scan done by oncology 11/2018 with enlarged pulmonary artery Formatting of this note might be different from the original. 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   Urticaria    UTI (urinary tract infection) 05/13/2019   Vitamin B 12 deficiency    Vitamin B12 deficiency 12/13/2015   Weight loss 04/22/2020    Past Surgical History:  Procedure Laterality Date   BLADDER SUSPENSION  2002   BREAST SURGERY     several benign cysts removed; surgeries from 1975- 1990    CHOLECYSTECTOMY  1988   DECOMPRESSIVE LUMBAR LAMINECTOMY LEVEL 1 N/A 02/09/2021   Procedure: DECOMPRESSIVE LUMBAR LAMINECTOMY LEVEL 1 lumbar three-four;  Surgeon: Venita Lick, MD;  Location: [facility] OR;  Service: Orthopedics;  Laterality: N/A;   EYE SURGERY  2010 amd 2012   cataracts removed bilateral    IR FLUORO GUIDE PORT INSERTION RIGHT  02/28/2018   IR REMOVAL TUN ACCESS W/ PORT W/O FL MOD SED  03/28/2022   IR US GUIDE VASC ACCESS RIGHT  02/28/2018   KNEE ARTHROSCOPY Left    l knee x2   KYPHOPLASTY N/A 02/09/2021   Procedure: KYPHOPLASTY lumbar four;  Surgeon: Venita Lick, MD;  Location: [facility] OR;  Service: Orthopedics;  Laterality: N/A;   PARATHYROIDECTOMY  2010   ROBOTIC ASSISTED SALPINGO OOPHERECTOMY Bilateral 01/31/2018   Procedure: XI ROBOTIC ASSISTED BILATERAL SALPINGO OOPHORECTOMY, STAGING, LAPAROTOMY, PELVIC AND PARA AORTIC LYMPH NODE DISSECTION, OMENTECTOMY;  Surgeon: Shonna Chock, MD;  Location: [facility];  Service:  Gynecology;  Laterality: Bilateral;   ROTATOR CUFF REPAIR  2005   right    TOTAL KNEE ARTHROPLASTY Left 12/08/2014   Procedure: LEFT TOTAL KNEE ARTHROPLASTY;  Surgeon: Jacki Cones, MD;  Location: [facility];  Service: Orthopedics;  Laterality: Left;   VAGINAL HYSTERECTOMY  1984    MEDICATIONS:  ALPRAZolam (XANAX) 0.5 MG tablet   amLODipine (NORVASC) 5 MG tablet   atorvastatin (LIPITOR) 10 MG tablet  cyanocobalamin (VITAMIN B12) 1000 MCG/ML injection   FLUoxetine (PROZAC) 20 MG capsule   gabapentin (NEURONTIN) 600 MG tablet   hydrochlorothiazide (HYDRODIURIL) 25 MG tablet   levothyroxine (SYNTHROID) 88 MCG tablet   linaclotide (LINZESS) 145 MCG CAPS capsule   losartan (COZAAR) 100 MG tablet   Magnesium 250 MG TABS   Melatonin 10 MG TABS   metFORMIN (GLUCOPHAGE-XR) 500 MG 24 hr tablet   montelukast (SINGULAIR) 10 MG tablet   No current facility-administered medications for this encounter.     Jodell Cipro Ward, PA-C [facility] [phone]

## 2023-04-26 ENCOUNTER — Encounter (HOSPITAL_COMMUNITY): Payer: Self-pay | Admitting: Orthopedic Surgery

## 2023-04-26 ENCOUNTER — Encounter (HOSPITAL_COMMUNITY)
Admission: RE | Disposition: A | Discharge status: Home / Self Care | Payer: Self-pay | Source: Home / Self Care | Attending: Orthopedic Surgery

## 2023-04-26 ENCOUNTER — Ambulatory Visit (HOSPITAL_COMMUNITY)
Admission: RE | Admit: 2023-04-26 | Discharge: 2023-04-26 | Disposition: A | Discharge status: Home / Self Care | Payer: Medicare HMO | Attending: Orthopedic Surgery | Admitting: Orthopedic Surgery

## 2023-04-26 ENCOUNTER — Ambulatory Visit (HOSPITAL_COMMUNITY): Payer: Medicare HMO | Admitting: Physician Assistant

## 2023-04-26 ENCOUNTER — Other Ambulatory Visit: Payer: Self-pay

## 2023-04-26 ENCOUNTER — Ambulatory Visit (HOSPITAL_BASED_OUTPATIENT_CLINIC_OR_DEPARTMENT_OTHER): Payer: Medicare HMO | Admitting: Anesthesiology

## 2023-04-26 DIAGNOSIS — M75101 Unspecified rotator cuff tear or rupture of right shoulder, not specified as traumatic: Secondary | ICD-10-CM

## 2023-04-26 DIAGNOSIS — M1990 Unspecified osteoarthritis, unspecified site: Secondary | ICD-10-CM | POA: Insufficient documentation

## 2023-04-26 DIAGNOSIS — E1151 Type 2 diabetes mellitus with diabetic peripheral angiopathy without gangrene: Secondary | ICD-10-CM | POA: Diagnosis not present

## 2023-04-26 DIAGNOSIS — I10 Essential (primary) hypertension: Secondary | ICD-10-CM

## 2023-04-26 DIAGNOSIS — K219 Gastro-esophageal reflux disease without esophagitis: Secondary | ICD-10-CM | POA: Insufficient documentation

## 2023-04-26 DIAGNOSIS — I2510 Atherosclerotic heart disease of native coronary artery without angina pectoris: Secondary | ICD-10-CM | POA: Diagnosis not present

## 2023-04-26 DIAGNOSIS — F32A Depression, unspecified: Secondary | ICD-10-CM | POA: Insufficient documentation

## 2023-04-26 DIAGNOSIS — I080 Rheumatic disorders of both mitral and aortic valves: Secondary | ICD-10-CM | POA: Diagnosis not present

## 2023-04-26 DIAGNOSIS — E039 Hypothyroidism, unspecified: Secondary | ICD-10-CM | POA: Diagnosis not present

## 2023-04-26 DIAGNOSIS — Z7984 Long term (current) use of oral hypoglycemic drugs: Secondary | ICD-10-CM | POA: Diagnosis not present

## 2023-04-26 HISTORY — PX: REVERSE SHOULDER ARTHROPLASTY: SHX5054

## 2023-04-26 LAB — GLUCOSE, CAPILLARY
Glucose-Capillary: 122 mg/dL — ABNORMAL HIGH (ref 70–99)
Glucose-Capillary: 123 mg/dL — ABNORMAL HIGH (ref 70–99)

## 2023-04-26 SURGERY — ARTHROPLASTY, SHOULDER, TOTAL, REVERSE
Anesthesia: Regional | Site: Shoulder | Laterality: Right

## 2023-04-26 MED ORDER — TRANEXAMIC ACID-NACL 1000-0.7 MG/100ML-% IV SOLN
1000.0000 mg | INTRAVENOUS | Status: AC
  Administered 2023-04-26: 1000 mg via INTRAVENOUS
  Filled 2023-04-26: qty 100

## 2023-04-26 MED ORDER — BUPIVACAINE LIPOSOME 1.3 % IJ SUSP
INTRAMUSCULAR | Status: DC | PRN
  Administered 2023-04-26: 10 mL

## 2023-04-26 MED ORDER — LACTATED RINGERS IV SOLN: INTRAVENOUS | Status: DC

## 2023-04-26 MED ORDER — BUPIVACAINE HCL (PF) 0.5 % IJ SOLN
INTRAMUSCULAR | Status: DC | PRN
  Administered 2023-04-26: 15 mL via PERINEURAL

## 2023-04-26 MED ORDER — LIDOCAINE HCL (CARDIAC) PF 100 MG/5ML IV SOSY
PREFILLED_SYRINGE | INTRAVENOUS | Status: DC | PRN
  Administered 2023-04-26: 50 mg via INTRAVENOUS

## 2023-04-26 MED ORDER — GLYCOPYRROLATE 0.2 MG/ML IJ SOLN
INTRAMUSCULAR | Status: DC | PRN
  Administered 2023-04-26: .2 mg via INTRAVENOUS

## 2023-04-26 MED ORDER — FENTANYL CITRATE (PF) 100 MCG/2ML IJ SOLN
INTRAMUSCULAR | Status: AC
  Filled 2023-04-26: qty 2

## 2023-04-26 MED ORDER — FENTANYL CITRATE (PF) 100 MCG/2ML IJ SOLN
INTRAMUSCULAR | Status: DC | PRN
  Administered 2023-04-26: 50 ug via INTRAVENOUS
  Administered 2023-04-26 (×2): 25 ug via INTRAVENOUS

## 2023-04-26 MED ORDER — 0.9 % SODIUM CHLORIDE (POUR BTL) OPTIME
TOPICAL | Status: DC | PRN
  Administered 2023-04-26: 1000 mL

## 2023-04-26 MED ORDER — PROPOFOL 10 MG/ML IV BOLUS
INTRAVENOUS | Status: DC | PRN
  Administered 2023-04-26: 70 mg via INTRAVENOUS

## 2023-04-26 MED ORDER — OXYCODONE-ACETAMINOPHEN 5-325 MG PO TABS: 1.0000 | ORAL_TABLET | ORAL | 0 refills | Status: DC | PRN

## 2023-04-26 MED ORDER — NAPROXEN 500 MG PO TABS: 500.0000 mg | ORAL_TABLET | Freq: Two times a day (BID) | ORAL | 1 refills | Status: DC

## 2023-04-26 MED ORDER — MIDAZOLAM HCL 2 MG/2ML IJ SOLN
INTRAMUSCULAR | Status: AC
  Filled 2023-04-26: qty 2

## 2023-04-26 MED ORDER — METHOCARBAMOL 500 MG PO TABS: 500.0000 mg | ORAL_TABLET | Freq: Three times a day (TID) | ORAL | 1 refills | Status: DC | PRN

## 2023-04-26 MED ORDER — INSULIN ASPART 100 UNIT/ML IJ SOLN: 0.0000 [IU] | INTRAMUSCULAR | Status: DC | PRN

## 2023-04-26 MED ORDER — SUGAMMADEX SODIUM 200 MG/2ML IV SOLN
INTRAVENOUS | Status: DC | PRN
  Administered 2023-04-26: 200 mg via INTRAVENOUS

## 2023-04-26 MED ORDER — ONDANSETRON HCL 4 MG/2ML IJ SOLN
INTRAMUSCULAR | Status: DC | PRN
  Administered 2023-04-26: 4 mg via INTRAVENOUS

## 2023-04-26 MED ORDER — METOCLOPRAMIDE HCL 5 MG/ML IJ SOLN: 5.0000 mg | Freq: Three times a day (TID) | INTRAMUSCULAR | Status: DC | PRN

## 2023-04-26 MED ORDER — ORAL CARE MOUTH RINSE: 15.0000 mL | Freq: Once | OROMUCOSAL | Status: AC

## 2023-04-26 MED ORDER — ONDANSETRON HCL 4 MG/2ML IJ SOLN
INTRAMUSCULAR | Status: AC
  Filled 2023-04-26: qty 2

## 2023-04-26 MED ORDER — ROCURONIUM BROMIDE 10 MG/ML (PF) SYRINGE
PREFILLED_SYRINGE | INTRAVENOUS | Status: AC
  Filled 2023-04-26: qty 10

## 2023-04-26 MED ORDER — ONDANSETRON HCL 4 MG PO TABS: 4.0000 mg | ORAL_TABLET | Freq: Four times a day (QID) | ORAL | Status: DC | PRN

## 2023-04-26 MED ORDER — GLYCOPYRROLATE 0.2 MG/ML IJ SOLN
INTRAMUSCULAR | Status: AC
  Filled 2023-04-26: qty 1

## 2023-04-26 MED ORDER — PHENYLEPHRINE 80 MCG/ML (10ML) SYRINGE FOR IV PUSH (FOR BLOOD PRESSURE SUPPORT)
PREFILLED_SYRINGE | INTRAVENOUS | Status: AC
  Filled 2023-04-26: qty 10

## 2023-04-26 MED ORDER — PHENYLEPHRINE HCL-NACL 20-0.9 MG/250ML-% IV SOLN
INTRAVENOUS | Status: AC
  Filled 2023-04-26: qty 250

## 2023-04-26 MED ORDER — CHLORHEXIDINE GLUCONATE 0.12 % MT SOLN
15.0000 mL | Freq: Once | OROMUCOSAL | Status: AC
  Administered 2023-04-26: 15 mL via OROMUCOSAL

## 2023-04-26 MED ORDER — DEXMEDETOMIDINE HCL IN NACL 80 MCG/20ML IV SOLN
INTRAVENOUS | Status: DC | PRN
  Administered 2023-04-26: 4 ug via INTRAVENOUS

## 2023-04-26 MED ORDER — EPHEDRINE SULFATE (PRESSORS) 50 MG/ML IJ SOLN
INTRAMUSCULAR | Status: DC | PRN
  Administered 2023-04-26: 5 mg via INTRAVENOUS

## 2023-04-26 MED ORDER — DEXMEDETOMIDINE HCL IN NACL 80 MCG/20ML IV SOLN
INTRAVENOUS | Status: AC
  Filled 2023-04-26: qty 20

## 2023-04-26 MED ORDER — VANCOMYCIN HCL 1000 MG IV SOLR
INTRAVENOUS | Status: DC | PRN
  Administered 2023-04-26: 1000 mg via TOPICAL

## 2023-04-26 MED ORDER — VANCOMYCIN HCL 1000 MG IV SOLR
INTRAVENOUS | Status: AC
  Filled 2023-04-26: qty 20

## 2023-04-26 MED ORDER — FENTANYL CITRATE PF 50 MCG/ML IJ SOSY: 25.0000 ug | PREFILLED_SYRINGE | INTRAMUSCULAR | Status: DC | PRN

## 2023-04-26 MED ORDER — PHENYLEPHRINE HCL-NACL 20-0.9 MG/250ML-% IV SOLN
INTRAVENOUS | Status: DC | PRN
  Administered 2023-04-26: 25 ug/min via INTRAVENOUS

## 2023-04-26 MED ORDER — DEXAMETHASONE SODIUM PHOSPHATE 10 MG/ML IJ SOLN
INTRAMUSCULAR | Status: AC
  Filled 2023-04-26: qty 1

## 2023-04-26 MED ORDER — ROCURONIUM BROMIDE 100 MG/10ML IV SOLN
INTRAVENOUS | Status: DC | PRN
  Administered 2023-04-26: 50 mg via INTRAVENOUS

## 2023-04-26 MED ORDER — ONDANSETRON HCL 4 MG/2ML IJ SOLN: 4.0000 mg | Freq: Four times a day (QID) | INTRAMUSCULAR | Status: DC | PRN

## 2023-04-26 MED ORDER — CEFAZOLIN SODIUM-DEXTROSE 2-4 GM/100ML-% IV SOLN
2.0000 g | INTRAVENOUS | Status: AC
  Administered 2023-04-26: 2 g via INTRAVENOUS
  Filled 2023-04-26: qty 100

## 2023-04-26 MED ORDER — METOCLOPRAMIDE HCL 5 MG PO TABS: 5.0000 mg | ORAL_TABLET | Freq: Three times a day (TID) | ORAL | Status: DC | PRN

## 2023-04-26 MED ORDER — TRANEXAMIC ACID 1000 MG/10ML IV SOLN
1000.0000 mg | INTRAVENOUS | Status: DC
Start: 2023-04-26 — End: 2023-04-26

## 2023-04-26 MED ORDER — ACETAMINOPHEN 500 MG PO TABS: 1000.0000 mg | ORAL_TABLET | Freq: Once | ORAL | Status: DC

## 2023-04-26 MED ORDER — ONDANSETRON HCL 4 MG PO TABS: 4.0000 mg | ORAL_TABLET | Freq: Three times a day (TID) | ORAL | 0 refills | Status: DC | PRN

## 2023-04-26 MED ORDER — LIDOCAINE HCL (PF) 2 % IJ SOLN
INTRAMUSCULAR | Status: AC
  Filled 2023-04-26: qty 5

## 2023-04-26 MED ORDER — PROPOFOL 10 MG/ML IV BOLUS
INTRAVENOUS | Status: AC
  Filled 2023-04-26: qty 20

## 2023-04-26 MED ORDER — STERILE WATER FOR IRRIGATION IR SOLN
Status: DC | PRN
  Administered 2023-04-26: 2000 mL

## 2023-04-26 MED ORDER — EPHEDRINE 5 MG/ML INJ
INTRAVENOUS | Status: AC
  Filled 2023-04-26: qty 5

## 2023-04-26 SURGICAL SUPPLY — 70 items
ADH SKN CLS APL DERMABOND .7 (GAUZE/BANDAGES/DRESSINGS) ×1
AID PSTN UNV HD RSTRNT DISP (MISCELLANEOUS) ×1
BAG COUNTER SPONGE SURGICOUNT (BAG) IMPLANT
BAG SPEC THK2 15X12 ZIP CLS (MISCELLANEOUS) ×1
BAG SPNG CNTER NS LX DISP (BAG)
BAG ZIPLOCK 12X15 (MISCELLANEOUS) ×1 IMPLANT
BIT DRILL AR 3 (BIT) ×1
BIT DRILL AR 3 NS (BIT) IMPLANT
BLADE SAW SGTL 83.5X18.5 (BLADE) ×1 IMPLANT
BNDG CMPR 5X4 CHSV STRCH STRL (GAUZE/BANDAGES/DRESSINGS) ×1
BNDG COHESIVE 4X5 TAN STRL LF (GAUZE/BANDAGES/DRESSINGS) ×1 IMPLANT
BSPLAT GLND +2X24 MDLR (Joint) ×1 IMPLANT
COOLER ICEMAN CLASSIC (MISCELLANEOUS) ×1 IMPLANT
COVER BACK TABLE 60X90IN (DRAPES) ×1 IMPLANT
COVER SURGICAL LIGHT HANDLE (MISCELLANEOUS) ×1 IMPLANT
CUP SUT UNIV REVERS 36 NEUTRAL (Cup) IMPLANT
DERMABOND ADVANCED .7 DNX12 (GAUZE/BANDAGES/DRESSINGS) ×1 IMPLANT
DRAPE ORTHO SPLIT 77X108 STRL (DRAPES) ×2
DRAPE SHEET LG 3/4 BI-LAMINATE (DRAPES) ×1 IMPLANT
DRAPE SURG 17X11 SM STRL (DRAPES) ×1 IMPLANT
DRAPE SURG ORHT 6 SPLT 77X108 (DRAPES) ×2 IMPLANT
DRAPE TOP 10253 STERILE (DRAPES) ×1 IMPLANT
DRAPE U-SHAPE 47X51 STRL (DRAPES) ×1 IMPLANT
DRESSING AQUACEL AG SP 3.5X6 (GAUZE/BANDAGES/DRESSINGS) ×1 IMPLANT
DRSG AQUACEL AG ADV 3.5X 6 (GAUZE/BANDAGES/DRESSINGS) IMPLANT
DRSG AQUACEL AG ADV 3.5X10 (GAUZE/BANDAGES/DRESSINGS) IMPLANT
DRSG AQUACEL AG SP 3.5X6 (GAUZE/BANDAGES/DRESSINGS) ×1
DURAPREP 26ML APPLICATOR (WOUND CARE) ×1 IMPLANT
ELECT BLADE TIP CTD 4 INCH (ELECTRODE) ×1 IMPLANT
ELECT PENCIL ROCKER SW 15FT (MISCELLANEOUS) ×1 IMPLANT
ELECT REM PT RETURN 15FT ADLT (MISCELLANEOUS) ×1 IMPLANT
FACESHIELD WRAPAROUND (MASK) ×5 IMPLANT
FACESHIELD WRAPAROUND OR TEAM (MASK) ×5 IMPLANT
GLENOID UNI REV MOD 24 +2 LAT (Joint) IMPLANT
GLENOSPHERE 36 +4 LAT/24 (Joint) IMPLANT
GLOVE BIO SURGEON STRL SZ7.5 (GLOVE) ×1 IMPLANT
GLOVE BIO SURGEON STRL SZ8 (GLOVE) ×1 IMPLANT
GLOVE SS BIOGEL STRL SZ 7 (GLOVE) ×1 IMPLANT
GLOVE SS BIOGEL STRL SZ 7.5 (GLOVE) ×1 IMPLANT
GOWN STRL SURGICAL XL XLNG (GOWN DISPOSABLE) ×2 IMPLANT
INSERT HUMERAL UNI REVERS 36 6 (Insert) IMPLANT
KIT BASIN OR (CUSTOM PROCEDURE TRAY) ×1 IMPLANT
KIT TURNOVER KIT A (KITS) IMPLANT
MANIFOLD NEPTUNE II (INSTRUMENTS) ×1 IMPLANT
NDL TAPERED W/ NITINOL LOOP (MISCELLANEOUS) ×1 IMPLANT
NEEDLE TAPERED W/ NITINOL LOOP (MISCELLANEOUS) ×1 IMPLANT
NS IRRIG 1000ML POUR BTL (IV SOLUTION) ×1 IMPLANT
PACK SHOULDER (CUSTOM PROCEDURE TRAY) ×1 IMPLANT
PAD ARMBOARD 7.5X6 YLW CONV (MISCELLANEOUS) ×1 IMPLANT
PAD COLD SHLDR WRAP-ON (PAD) ×1 IMPLANT
PIN NITINOL TARGETER 2.8 (PIN) IMPLANT
PIN SET MODULAR GLENOID SYSTEM (PIN) IMPLANT
RESTRAINT HEAD UNIVERSAL NS (MISCELLANEOUS) ×1 IMPLANT
SCREW CENTRAL MOD 30MM (Screw) IMPLANT
SCREW PERI LOCK 5.5X16 (Screw) IMPLANT
SCREW PERIPHERAL 5.5X20 LOCK (Screw) IMPLANT
SCREW PERIPHERAL 5.5X40 LOCK (Screw) IMPLANT
SLING ARM FOAM STRAP LRG (SOFTGOODS) IMPLANT
SLING ARM FOAM STRAP MED (SOFTGOODS) IMPLANT
STEM HUMERAL UNIVERS SZ8 (Stem) IMPLANT
SUT MNCRL AB 3-0 PS2 18 (SUTURE) ×1 IMPLANT
SUT MON AB 2-0 CT1 36 (SUTURE) ×1 IMPLANT
SUT VIC AB 1 CT1 36 (SUTURE) ×1 IMPLANT
SUTURE TAPE 1.3 40 TPR END (SUTURE) ×2 IMPLANT
SUTURETAPE 1.3 40 TPR END (SUTURE) ×2
TOWEL OR 17X26 10 PK STRL BLUE (TOWEL DISPOSABLE) ×1 IMPLANT
TOWEL OR NON WOVEN STRL DISP B (DISPOSABLE) ×1 IMPLANT
TUBE SUCTION HIGH CAP CLEAR NV (SUCTIONS) ×1 IMPLANT
TUBING CONNECTING 10 (TUBING) ×1 IMPLANT
WATER STERILE IRR 1000ML POUR (IV SOLUTION) ×2 IMPLANT

## 2023-04-26 NOTE — Discharge Instructions (Signed)

## 2023-04-26 NOTE — Anesthesia Preprocedure Evaluation (Addendum)
Anesthesia Evaluation  Patient identified by MRN, date of birth, ID band Patient awake    Reviewed: Allergy & Precautions, NPO status , Patient's Chart, lab work & pertinent test results  History of Anesthesia Complications (+) PONV and history of anesthetic complications  Airway Mallampati: II  TM Distance: >3 FB Neck ROM: Full    Dental no notable dental hx. (+) Teeth Intact, Dental Advisory Given   Pulmonary neg pulmonary ROS   Pulmonary exam normal breath sounds clear to auscultation       Cardiovascular hypertension, Pt. on medications + CAD and + Peripheral Vascular Disease  Normal cardiovascular exam Rhythm:Regular Rate:Normal  TTE 2024 1. Left ventricular ejection fraction, by estimation, is 60 to 65%. The  left ventricle has normal function. The left ventricle has no regional  wall motion abnormalities. Left ventricular diastolic parameters are  consistent with Grade I diastolic  dysfunction (impaired relaxation).   2. Right ventricular systolic function is normal. The right ventricular  size is normal.   3. The mitral valve is normal in structure. Mild mitral valve  regurgitation. No evidence of mitral stenosis.   4. The aortic valve is normal in structure. Aortic valve regurgitation is  not visualized. Aortic valve sclerosis is present, with no evidence of  aortic valve stenosis.   5. The inferior vena cava is normal in size with greater than 50%  respiratory variability, suggesting right atrial pressure of 3 mmHg.   Stress Test 2024 normal    Neuro/Psych  PSYCHIATRIC DISORDERS  Depression    negative neurological ROS     GI/Hepatic Neg liver ROS,GERD  ,,  Endo/Other  diabetes, Type 2, Oral Hypoglycemic AgentsHypothyroidism    Renal/GU negative Renal ROS  negative genitourinary   Musculoskeletal  (+) Arthritis ,    Abdominal   Peds  Hematology negative hematology ROS (+)   Anesthesia Other  Findings   Reproductive/Obstetrics                             Anesthesia Physical Anesthesia Plan  ASA: 3  Anesthesia Plan: General and Regional   Post-op Pain Management: Regional block* and Tylenol PO (pre-op)*   Induction: Intravenous  PONV Risk Score and Plan: 4 or greater and Dexamethasone, Ondansetron and Treatment may vary due to age or medical condition  Airway Management Planned: Oral ETT  Additional Equipment:   Intra-op Plan:   Post-operative Plan: Extubation in OR  Informed Consent: I have reviewed the patients History and Physical, chart, labs and discussed the procedure including the risks, benefits and alternatives for the proposed anesthesia with the patient or authorized representative who has indicated his/her understanding and acceptance.     Dental advisory given  Plan Discussed with: CRNA  Anesthesia Plan Comments:        Anesthesia Quick Evaluation

## 2023-04-26 NOTE — Evaluation (Signed)
Occupational Therapy Evaluation Patient Details Name: [name] MRN: [id number] DOB: April 04, 1941 Today's Date: 04/26/2023   History of Present Illness 82 year old female now s/p RT reverse TSA on 04/26/23. PMH includes HTN, hypothyroidism, DM2, history of ovarian cancer, arthritis, L TKR 2016, salpingo oopherectomy 01/2018, kyphoplasty   Clinical Impression   Pt is a 82 year old female, s/p RT reverse shoulder replacement without functional use of RT dominant upper extremity secondary to effects of surgery and interscalene block and shoulder precautions. Therapist provided education and instruction to patient in regards to exercises, precautions, positioning, donning upper extremity clothing and bathing while maintaining shoulder precautions, ice and edema management and donning/doffing sling. Patient verbalized understanding and demonstrated as needed. Patient needed assistance to donn shirt, underwear, pants, socks and shoes and provided with instruction on compensatory strategies to perform ADLs. Patient limited by decreased ROM in RT shoulder so therefore will need some form of assistance at home. Patient verbalized and/or demonstrated understanding to all instruction. Patient to follow up with MD for further therapy needs.        Recommendations for follow up therapy are one component of a multi-disciplinary discharge planning process, led by the attending physician.  Recommendations may be updated based on patient status, additional functional criteria and insurance authorization.   Assistance Recommended at Discharge Intermittent Supervision/Assistance  Patient can return home with the following A little help with bathing/dressing/bathroom;Assist for transportation;Assistance with cooking/housework    Functional Status Assessment  Patient has had a recent decline in their functional status and demonstrates the ability to make significant improvements in function in a reasonable and  predictable amount of time.  Equipment Recommendations  None recommended by OT    Recommendations for Other Services       Precautions / Restrictions Precautions Precautions: Shoulder Type of Shoulder Precautions: rTSA Shoulder Interventions: Shoulder sling/immobilizer;Off for dressing/bathing/exercises Precaution Booklet Issued: Yes (comment) Restrictions Weight Bearing Restrictions: Yes RUE Weight Bearing: Non weight bearing Other Position/Activity Restrictions: If sitting in controlled environment, ok to come out of sling to give neck a break. Please sleep in it to protect until follow up in office.     OK to use operative arm for feeding, hygiene and ADLs.   Ok to instruct Pendulums and lap slides as exercises. Ok to use operative arm within the following parameters for ADL purposes     New ROM (4/09)   Ok for PROM, AAROM, AROM within pain tolerance and within the following ROM   ER 20   ABD 45   FE 60      Mobility Bed Mobility               General bed mobility comments: Received in recliner    Transfers Overall transfer level: Modified independent                        Balance Overall balance assessment: Mild deficits observed, not formally tested                                         ADL either performed or assessed with clinical judgement   ADL Overall ADL's : Needs assistance/impaired Eating/Feeding: Set up;Sitting   Grooming: Wash/dry hands;Minimal assistance;Standing Grooming Details (indicate cue type and reason): Min As to dry Lt hand due to numbness to RUE. Pt educated that she can use both hands  for washing once active movement returns. Upper Body Bathing: Min guard;Cueing for compensatory techniques;Cueing for UE precautions;Sitting Upper Body Bathing Details (indicate cue type and reason): Pt educated on dangle method Lower Body Bathing: Minimal assistance;Sit to/from stand   Upper Body Dressing : Moderate  assistance;Sitting   Lower Body Dressing: Minimal assistance;Sitting/lateral leans;Sit to/from stand   Toilet Transfer: Supervision/safety;Comfort height toilet;Ambulation;Grab bars   Toileting- Clothing Manipulation and Hygiene: Minimal assistance Toileting - Clothing Manipulation Details (indicate cue type and reason): Min As for clothing management.     Functional mobility during ADLs: Supervision/safety       Vision   Vision Assessment?: No apparent visual deficits     Perception     Praxis      Pertinent Vitals/Pain Pain Assessment Pain Assessment: No/denies pain (RUE feels completely numbs)     Hand Dominance Right   Extremity/Trunk Assessment Upper Extremity Assessment Upper Extremity Assessment: RUE deficits/detail RUE: Unable to fully assess due to immobilization LUE Deficits / Details: h/o RTC tear, gets shots as needed.   Lower Extremity Assessment Lower Extremity Assessment: RLE deficits/detail RLE Deficits / Details: Reports recent RT knee trouble, "locking up"       Communication Communication Communication: No difficulties   Cognition Arousal/Alertness: Awake/alert Behavior During Therapy: WFL for tasks assessed/performed Overall Cognitive Status: Within Functional Limits for tasks assessed                                       General Comments       Exercises Other Exercises Other Exercises: Pt educated in all exercises per MD orders including pendulums, lap slides and ahnd/wrist/forearm/elbow AROM exercises. OT demonstrarted and pt followed along with LUE to show understanding.   Shoulder Instructions      Home Living Family/patient expects to be discharged to:: Private residence Living Arrangements: Children Available Help at Discharge: Family;Available 24 hours/day Type of Home: House (Plans to stay at daughter's home while recovering. Below info is for dtr's house) Home Access: Stairs to enter [employer]  of Steps: 3 Entrance Stairs-Rails: Right Home Layout: One level     Bathroom Shower/Tub: Chief Strategy Officer: Standard     Home Equipment: Shower seat;Grab bars - tub/shower;BSC/3in1;Rolling Environmental consultant (2 wheels);Cane - single point          Prior Functioning/Environment               Mobility Comments: Typically ambulates without AD ADLs Comments: Independent, drives, shops, yardyard        OT Problem List: Decreased range of motion;Decreased strength      OT Treatment/Interventions:      OT Goals(Current goals can be found in the care plan section) Acute Rehab OT Goals Patient Stated Goal: Home today OT Goal Formulation: All assessment and education complete, DC therapy Potential to Achieve Goals: Good ADL Goals Additional ADL Goal #1: Pt will demonstrate UE/LE dressing, donning/doffing of sling, correct positioning of RT UE, and compensatory strategies for RT axilla hygiene, as well as use of Ice man cryo cuff, while correctly following all shoulder post-op precautions/restrictions.  OT Frequency:      Co-evaluation              AM-PAC OT "6 Clicks" Daily Activity     Outcome Measure Help from another person eating meals?: A Little Help from another person taking care of personal grooming?: A Little Help from  another person toileting, which includes using toliet, bedpan, or urinal?: A Little Help from another person bathing (including washing, rinsing, drying)?: A Little Help from another person to put on and taking off regular upper body clothing?: A Little Help from another person to put on and taking off regular lower body clothing?: A Little 6 Click Score: 18   End of Session Equipment Utilized During Treatment:  (sling) Nurse Communication: Other (comment) (Ready for discharge from OT standpoint)  Activity Tolerance: Patient tolerated treatment well Patient left: in chair;with nursing/sitter in room  OT Visit Diagnosis: Muscle  weakness (generalized) (M62.81)                Time: 0454-0981 OT Time Calculation (min): 40 min Charges:  OT General Charges $OT Visit: 1 Visit OT Evaluation $OT Eval Low Complexity: 1 Low OT Treatments $Self Care/Home Management : 23-37 mins  Victorino Dike, OT Acute Rehab Services Office: [phone] 04/26/2023   Theodoro Clock 04/26/2023, 12:39 PM

## 2023-04-26 NOTE — Anesthesia Postprocedure Evaluation (Signed)
Anesthesia Post Note  Patient: [name]  Procedure(s) Performed: REVERSE SHOULDER ARTHROPLASTY (Right: Shoulder)     Patient location during evaluation: PACU Anesthesia Type: Regional and General Level of consciousness: awake and alert Pain management: pain level controlled Vital Signs Assessment: post-procedure vital signs reviewed and stable Respiratory status: spontaneous breathing, nonlabored ventilation, respiratory function stable and patient connected to nasal cannula oxygen Cardiovascular status: blood pressure returned to baseline and stable Postop Assessment: no apparent nausea or vomiting Anesthetic complications: no  No notable events documented.  Last Vitals:  Vitals:   04/26/23 1015 04/26/23 1016  BP: 138/64 138/71  Pulse: 60 (!) 59  Resp: 13 15  Temp: 36.5 C 36.5 C  SpO2: 94% 95%    Last Pain:  Vitals:   04/26/23 1016  TempSrc:   PainSc: 0-No pain                 Analiya Porco L Izzy Doubek

## 2023-04-26 NOTE — Anesthesia Procedure Notes (Signed)
Procedure Name: Intubation Date/Time: 04/26/2023 7:46 AM  Performed by: Garth Bigness, CRNAPre-anesthesia Checklist: Patient identified, Emergency Drugs available, Suction available and Patient being monitored Patient Re-evaluated:Patient Re-evaluated prior to induction Oxygen Delivery Method: Circle system utilized Preoxygenation: Pre-oxygenation with 100% oxygen Induction Type: IV induction Ventilation: Mask ventilation without difficulty Laryngoscope Size: Mac and 3 Grade View: Grade I Tube type: Oral Tube size: 7.0 mm Number of attempts: 1 Airway Equipment and Method: Stylet Placement Confirmation: ETT inserted through vocal cords under direct vision, positive ETCO2 and breath sounds checked- equal and bilateral Secured at: 22 cm Tube secured with: Tape Dental Injury: Teeth and Oropharynx as per pre-operative assessment

## 2023-04-26 NOTE — Op Note (Addendum)
04/26/2023  9:04 AM  PATIENT:   [name]  82 y.o. female  PRE-OPERATIVE DIAGNOSIS:  Right shoulder rotator cuff tear arthropathy  POST-OPERATIVE DIAGNOSIS: Same  PROCEDURE: Right shoulder reverse arthroplasty utilizing a press-fit size 8 Arthrex stem with a neutral metathesis, +6 constrained polyethylene insert, 36/+4 glenosphere and a small/+2 baseplate  SURGEON:  Ormond Lazo, Vania Rea M.D.  ASSISTANTS: Ralene Bathe, PA-C  Ralene Bathe, PA-C was utilized as an Geophysicist/field seismologist throughout this case, essential for help with positioning the patient, positioning extremity, tissue manipulation, implantation of the prosthesis, suture management, wound closure, and intraoperative decision-making.  ANESTHESIA:   General endotracheal and interscalene block with Exparel  EBL: 100 cc  SPECIMEN: None  Drains: None  Note: Patient tolerated 2 g IV cefazolin without difficulties.   PATIENT DISPOSITION:  PACU - hemodynamically stable.    PLAN OF CARE: Discharge to home after PACU  Brief history:  Patient is an 82 year old female with chronic and progressive increasing right shoulder pain related to severe rotator cuff tear arthropathy.  Due to her increasing functional imitations and failure to respond to prolonged attempts at conservative management, patient is brought to the operating this time for planned right shoulder reverse arthroplasty.  Preoperatively, I counseled the patient regarding treatment options and risks versus benefits thereof.  Possible surgical complications were all reviewed including potential for bleeding, infection, neurovascular injury, persistent pain, loss of motion, anesthetic complication, failure of the implant, and possible need for additional surgery. They understand and accept and agrees with our planned procedure.   Procedure detail:  After undergoing routine preop evaluation the patient received prophylactic antibiotics and interscalene block with Exparel was  established in the holding area by the anesthesia department.  Subsequently placed spine on the operating table and underwent the smooth induction of a general endotracheal anesthesia.  Placed in the beachchair position and appropriately padded and protected.  Right shoulder girdle region sterilely prepped and draped in standard fashion.  Timeout was called.  A deltopectoral approach to the right shoulder was made through an approximately 8 cm incision.  Skin flaps were elevated and dissection carried deeply and electrocautery used for hemostasis.  The deltopectoral interval was then developed from proximal to distal with the vein taken laterally.  Conjoined tendon mobilized and retracted medially.  The long head biceps tendon was tenodesed at the upper border the pectoralis major tendon with proximal segment unroofed and excised.  The subscapularis was then separated from the lesser tuberosity using electrocautery and tagged with a pair of suture tape sutures although the excursion was quite restricted.  Capsular attachments were then divided from the anterior and inferior portions of the humeral neck and the humeral head was then delivered through the wound.  An extra medullary guide was then used to outline our proposed humeral head resection which we performed with an oscillating saw at approximately 20 degrees of retroversion.  Marginal osteophytes were removed and a metal cap was then placed over the cut proximal humeral surface and the glenoid was then exposed and a circumferential labral resection was then performed.  A guidepin was then directed into the center of the glenoid and the glenoid was then reamed with the central followed by the peripheral reamer to a stable [place] bony bed and all soft tissue and bony debris was carefully removed and irrigated free.  Preparation completed with a drill and tap for a 30 mm lag screw.  This point our baseplate was then assembled and inserted with vancomycin powder  applied to the threads of the lag screw and excellent fixation was achieved.  The peripheral locking screws were all then placed using standard technique with excellent fixation.  A 36/+4 glenosphere was then impacted onto the baseplate and a central locking screw was placed.  Our attention returned to the metaphysis where the canal was opened and we ultimately broached up to a size 8 at approximately 20 degrees of retroversion.  A neutral metaphyseal reaming guide was then used to prepare the metaphysis and a trial implant was placed showing excellent motion stability and soft tissue balance.  At this point the trial was removed.  Our final implant was assembled.  Vancomycin powder was then sprayed liberally into the canal and a final implant was then seated with excellent fixation.  A series of trial reduction was then performed and found best soft tissue balance with a +6 poly.  We ultimately remove the trial and used a +6 constrained poly given the status of the subscapularis.  Our final reduction was then performed and this showed excellent motion stability and soft tissue balance.  Our final irrigation was then completed.  Hemostasis was obtained.  Subscapularis was not repaired.  The deltopectoral interval was reapproximated with a series of figure-of-eight and with Vicryl sutures after we had placed the balance of vancomycin powder liberally throughout the deep soft tissue planes.  The subcu layer was closed with interrupted 2-0 Monocryl and intracuticular 3-0 Monocryl used to close the skin followed by Dermabond and Aquacel dressing.  The right arm was then placed into a sling and the patient was awakened, extubated, and taken to the recovery in stable condition.  Senaida Lange MD   Contact # [phone]

## 2023-04-26 NOTE — Anesthesia Procedure Notes (Signed)
Anesthesia Regional Block: Interscalene brachial plexus block   Pre-Anesthetic Checklist: , timeout performed,  Correct Patient, Correct Site, Correct Laterality,  Correct Procedure, Correct Position, site marked,  Risks and benefits discussed,  Pre-op evaluation,  At surgeon's request and post-op pain management  Laterality: Right  Prep: Maximum Sterile Barrier Precautions used, chloraprep       Needles:  Injection technique: Single-shot  Needle Type: Echogenic Stimulator Needle     Needle Length: 5cm  Needle Gauge: 21     Additional Needles:   Procedures:,,,, ultrasound used (permanent image in chart),,    Narrative:  Start time: 04/26/2023 7:08 AM End time: 04/26/2023 7:10 AM Injection made incrementally with aspirations every 5 mL. Anesthesiologist: Elmer Picker, MD

## 2023-04-26 NOTE — Transfer of Care (Signed)
Immediate Anesthesia Transfer of Care Note  Patient: [name]  Procedure(s) Performed: REVERSE SHOULDER ARTHROPLASTY (Right: Shoulder)  Patient Location: PACU  Anesthesia Type:General  Level of Consciousness: awake, alert , oriented, and patient cooperative  Airway & Oxygen Therapy: Patient Spontanous Breathing and Patient connected to face mask oxygen  Post-op Assessment: Report given to RN and Post -op Vital signs reviewed and stable  Post vital signs: Reviewed and stable  Last Vitals:  Vitals Value Taken Time  BP 143/62 04/26/23 0921  Temp    Pulse 59 04/26/23 0924  Resp 17 04/26/23 0924  SpO2 100 % 04/26/23 0924  Vitals shown include unfiled device data.  Last Pain:  Vitals:   04/26/23 0552  TempSrc: Oral  PainSc: 0-No pain         Complications: No notable events documented.

## 2023-04-26 NOTE — H&P (Signed)
[name]    Chief Complaint: Right shoulder rotator cuff tear arthropathy HPI: The patient is a 82 y.o. female with chronic and progressively increasing right shoulder pain related to severe rotator cuff tear arthropathy.  Due to her increasing functional limitations and failure to respond to prolonged attempts at conservative management, she is brought to the operating room at this time for planned right shoulder reverse arthroplasty  Past Medical History:  Diagnosis Date   Acquired hypothyroidism 12/13/2015   Last Assessment & Plan:   Relevant Hx:  Course:  Daily Update:  Today's Plan:update her TSH for her on her current dose of med     Electronically signed by: Krystal Clark, NP  01/11/16 [phone]   Acute diverticulitis 01/06/2022   Anemia due to antineoplastic chemotherapy 08/19/2019   Arthritis    Atherosclerosis 12/13/2015   Axillary lymphadenopathy 11/04/2018   Bilateral thumb pain 12/04/2022   Breast pain, right 11/04/2018   Bronchiectasis without complication (HCC) 07/08/2019   Bruising 09/22/2020   Change in nail appearance 02/03/2019   Formatting of this note might be different from the original. Recommend review with Onc and then Derm if not clear. Formatting of this note might be different from the original. Recommend review with Onc and then Derm if not clear.   Chemotherapy adverse reaction 03/11/2018   Chronic back pain greater than 3 months duration 09/24/2018   Chronic idiopathic constipation 03/13/2018   Chronic left shoulder pain 01/11/2021   Coronary artery calcification 11/28/2018   Formatting of this note might be different from the original. Seen on CT of chest 11/2018 Formatting of this note might be different from the original. 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) 12/13/2015   Last Assessment & Plan:   Relevant Hx:  Course:  Daily Update:   Today's Plan:we had a long discussion about med choices, she had been given onglyza samples when I saw her briefly in the lobby about 2 weeks ago to hold till she came back and did her labs, we discussed med choices with victoza and byetta which worked better for her though she could not afford them, discussed the newer meds farxiga a   Diabetes mellitus without complication (HCC)    type 2    Elevated liver function tests 02/06/2018   Essential hypertension 12/13/2015   Last Assessment & Plan:   Relevant Hx:  Course:  Daily Update:  Today's Plan:this is stable for her and will continue to follow this for her     Electronically signed by: Krystal Clark, NP  01/11/16 [phone]   Genetic testing 06/03/2018   TumorNext HRD + CancerNext germline testing was ordered.     The CancerNext gene panel offered by 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   Intractable pain    Left lower quadrant abdominal pain 01/02/2022   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   Lumbar disc herniation 02/09/2021   Lumbosacral radiculopathy    Major  depressive disorder, 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/13/2015   Malaise and fatigue 12/13/2015   Last Assessment & Plan:   Relevant Hx:  Course:  Daily Update:  Today's Plan:she is walking about 4-5 days per week and going to the gym and has gotten up to speed 4 on a level 3 , she feels her energy drain though is more sugar related and that is bothersome to her     Electronically signed by: Krystal Clark, NP  01/11/16 0913   Malignant neoplasm of left ovary (HCC)    Mixed hyperlipidemia 12/13/2015   Last Assessment & Plan:   Relevant Hx:  Course:  Daily Update:  Today's Plan:update her lipids for her today      Electronically signed by: Krystal Clark, NP  01/11/16 [phone]   Multiple lung nodules on CT 07/25/2018   Mycobacteria, 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 Probably related to to crestor.  Avoid statins. Formatting of this note might be different from the original. Probably related to to crestor.  Avoid statins.   Osteoarthritis of knee 12/04/2022   Other constipation 03/01/2018   Other fatigue 11/11/2018   Ovarian cancer (HCC) 01/31/2018   Pancytopenia, acquired (HCC) 10/25/2018   Physical debility 07/25/2018   Pneumonia    its been 4 years    PONV (postoperative nausea and vomiting)    Post-menopause 02/21/2016   Formatting of this note might be different from the original. Nl dexa 07/2020. Repeat 2025. Formatting of this note might be different from the original. Nl dexa 07/2020. Repeat 2025.   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 Seen on most recent CT scan done by oncology 11/2018 with enlarged pulmonary artery Formatting of this note might be different from the original. 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   Urticaria    UTI (urinary tract infection) 05/13/2019   Vitamin B 12 deficiency    Vitamin B12 deficiency 12/13/2015   Weight loss 04/22/2020      Past Surgical History:  Procedure Laterality Date   BLADDER SUSPENSION  2002   BREAST SURGERY     several benign cysts removed; surgeries from 1975- 1990    CHOLECYSTECTOMY  1988   DECOMPRESSIVE LUMBAR LAMINECTOMY LEVEL 1 N/A 02/09/2021   Procedure: DECOMPRESSIVE LUMBAR LAMINECTOMY LEVEL 1 lumbar three-four;  Surgeon: Venita Lick, MD;  Location: [facility] OR;  Service: Orthopedics;  Laterality: N/A;   EYE SURGERY  2010 amd 2012   cataracts removed bilateral    IR FLUORO GUIDE PORT INSERTION RIGHT  02/28/2018   IR REMOVAL TUN ACCESS W/ PORT W/O FL MOD SED  03/28/2022   IR US GUIDE VASC ACCESS RIGHT  02/28/2018   KNEE ARTHROSCOPY Left    l knee x2  KYPHOPLASTY N/A 02/09/2021   Procedure: KYPHOPLASTY lumbar four;  Surgeon: Venita Lick, MD;  Location: [facility] OR;  Service: Orthopedics;  Laterality: N/A;   PARATHYROIDECTOMY  2010   ROBOTIC ASSISTED SALPINGO OOPHERECTOMY Bilateral 01/31/2018   Procedure: XI ROBOTIC ASSISTED BILATERAL SALPINGO OOPHORECTOMY, STAGING, LAPAROTOMY, PELVIC AND PARA AORTIC LYMPH NODE DISSECTION, OMENTECTOMY;  Surgeon: Shonna Chock, MD;  Location: [facility] ORS;  Service: Gynecology;  Laterality: Bilateral;   ROTATOR CUFF REPAIR  2005   right    TOTAL KNEE ARTHROPLASTY Left 12/08/2014   Procedure: LEFT TOTAL KNEE ARTHROPLASTY;  Surgeon: Jacki Cones, MD;  Location: [facility] ORS;  Service: Orthopedics;  Laterality: Left;   VAGINAL HYSTERECTOMY  1984    Family History  Problem Relation Age of Onset   Lymphoma Mother 5   Bladder Cancer Sister 15   Prostate cancer Brother 17       has surgery right away after dx   Leukemia Brother        45's   Breast cancer Other 33       niece, GT 2017 reportedly neg   Prostate  cancer Father 15       found on autopsy at death (25)   Lung cancer Maternal Aunt    Esophageal cancer Maternal Uncle    Other Maternal Grandfather 85       cerebral hemmhorage   Lung cancer Cousin    Lung cancer Cousin    Leukemia Cousin    Allergic rhinitis Neg Hx    Angioedema Neg Hx    Asthma Neg Hx    Atopy Neg Hx    Eczema Neg Hx    Immunodeficiency Neg Hx    Urticaria Neg Hx     Social History:  reports that she has never smoked. She has never used smokeless tobacco. She reports that she does not drink alcohol and does not use drugs.  BMI: Estimated body mass index is 27.29 kg/m as calculated from the following:   Height as of 04/17/23: 5\' 5"  (1.651 m).   Weight as of 04/17/23: 74.4 kg.  Lab Results  Component Value Date   ALBUMIN 4.0 03/22/2023   Diabetes:   Patient has a diagnosis of diabetes,  Lab Results  Component Value Date   HGBA1C 6.3 (H) 04/17/2023   Smoking Status:   reports that she has never smoked. She has never used smokeless tobacco.     Medications Prior to Admission  Medication Sig Dispense Refill   ALPRAZolam (XANAX) 0.5 MG tablet Take 0.5 mg by mouth at bedtime.     amLODipine (NORVASC) 5 MG tablet Take 5 mg by mouth daily.     atorvastatin (LIPITOR) 10 MG tablet Take 10 mg by mouth daily.     cyanocobalamin (VITAMIN B12) 1000 MCG/ML injection Inject 1,000 mcg as directed every 30 (thirty) days.     FLUoxetine (PROZAC) 20 MG capsule Take 20 mg by mouth daily after breakfast.     gabapentin (NEURONTIN) 600 MG tablet Take 600 mg by mouth daily as needed (Nerve pain).     hydrochlorothiazide (HYDRODIURIL) 25 MG tablet Take 25 mg by mouth daily.     levothyroxine (SYNTHROID) 88 MCG tablet Take 88 mcg by mouth daily before breakfast.     linaclotide (LINZESS) 145 MCG CAPS capsule Take 145 mcg by mouth daily before breakfast.     losartan (COZAAR) 100 MG tablet Take 100 mg by mouth at bedtime.      Magnesium 250 MG TABS Take  250 mg by mouth daily.      Melatonin 10 MG TABS Take 10 mg by mouth at bedtime.     metFORMIN (GLUCOPHAGE-XR) 500 MG 24 hr tablet Take 500 mg by mouth 2 (two) times daily.     montelukast (SINGULAIR) 10 MG tablet Take 1 tablet (10 mg total) by mouth at bedtime. (Patient taking differently: Take 10 mg by mouth at bedtime as needed (allergies).) 30 tablet 5     Physical Exam: Right shoulder demonstrates painful and guarded motion as noted at her recent office visits.  She has globally decreased strength.  Examination otherwise as noted at recent office visits.  Imaging studies confirmed changes consistent with chronic right shoulder rotator cuff tear arthropathy.  Vitals     Assessment/Plan  Impression: Right shoulder rotator cuff tear arthropathy  Plan of Action: Procedure(s): REVERSE SHOULDER ARTHROPLASTY  [name] M Obe Ahlers 04/26/2023, 5:46 AM Contact # [phone]

## 2023-04-27 ENCOUNTER — Encounter (HOSPITAL_COMMUNITY): Payer: Self-pay | Admitting: Orthopedic Surgery

## 2023-05-23 DIAGNOSIS — S92309A Fracture of unspecified metatarsal bone(s), unspecified foot, initial encounter for closed fracture: Secondary | ICD-10-CM | POA: Insufficient documentation

## 2023-05-23 HISTORY — DX: Fracture of unspecified metatarsal bone(s), unspecified foot, initial encounter for closed fracture: S92.309A

## 2023-05-25 ENCOUNTER — Telehealth: Payer: Self-pay | Admitting: Oncology

## 2023-05-25 NOTE — Telephone Encounter (Signed)
Please let me know once we have results and then we can go from there

## 2023-05-25 NOTE — Telephone Encounter (Signed)
Called Illeana back and advised her of message from Dr. Bertis Ruddy.  She will let us know when the bone density test is scheduled.

## 2023-05-25 NOTE — Telephone Encounter (Signed)
Tnya left a message saying that she has had multiple bone fractures recently - she fractured her shoulder and had surgery in July, she recently had a stress fracture to her shoulder/rotator cuff and now has broken 4 bones in her foot.  Dr. Shary Decamp, her primary care doctor is going to schedule her for a bone density test in 6 weeks but she is wondering if Dr. Bertis Ruddy would recommend that she take calcium or if she has any recommendations to prevent any more fractures.

## 2023-06-01 DIAGNOSIS — S92811A Other fracture of right foot, initial encounter for closed fracture: Secondary | ICD-10-CM | POA: Insufficient documentation

## 2023-06-01 DIAGNOSIS — S92342A Displaced fracture of fourth metatarsal bone, left foot, initial encounter for closed fracture: Secondary | ICD-10-CM | POA: Insufficient documentation

## 2023-06-01 HISTORY — DX: Displaced fracture of fourth metatarsal bone, left foot, initial encounter for closed fracture: S92.342A

## 2023-06-01 HISTORY — DX: Other fracture of right foot, initial encounter for closed fracture: S92.811A

## 2023-06-28 ENCOUNTER — Encounter: Payer: Self-pay | Admitting: Cardiology

## 2023-06-28 ENCOUNTER — Ambulatory Visit: Payer: Medicare HMO | Attending: Cardiology | Admitting: Cardiology

## 2023-06-28 VITALS — BP 126/64 | HR 73 | Ht 65.0 in | Wt 167.6 lb

## 2023-06-28 DIAGNOSIS — I2510 Atherosclerotic heart disease of native coronary artery without angina pectoris: Secondary | ICD-10-CM

## 2023-06-28 DIAGNOSIS — I2584 Coronary atherosclerosis due to calcified coronary lesion: Secondary | ICD-10-CM

## 2023-06-28 DIAGNOSIS — I10 Essential (primary) hypertension: Secondary | ICD-10-CM | POA: Diagnosis not present

## 2023-06-28 DIAGNOSIS — Z7984 Long term (current) use of oral hypoglycemic drugs: Secondary | ICD-10-CM

## 2023-06-28 DIAGNOSIS — I272 Pulmonary hypertension, unspecified: Secondary | ICD-10-CM

## 2023-06-28 DIAGNOSIS — E1151 Type 2 diabetes mellitus with diabetic peripheral angiopathy without gangrene: Secondary | ICD-10-CM

## 2023-06-28 NOTE — Patient Instructions (Signed)

## 2023-06-28 NOTE — Progress Notes (Signed)
Cardiology Office Note:    Date:  06/28/2023   ID:  [name], DOB 1941-05-27, MRN [id number]  PCP:  Gordan Payment., MD  Cardiologist:  Gypsy Balsam, MD    Referring MD: Gordan Payment., MD   Chief Complaint  Patient presents with   Follow-up   Results    History of Present Illness:    [name] is a 82 y.o. female with past medical history significant for diabetes which is very well-controlled, dyslipidemia, essential hypertension, she never smoked, she was referred to me before surgery for her right shoulder after CT of her chest showed significant calcification of the coronary artery.  Workup for the problem included stress test which was negative.  There was also some mentioning about her having pulmonary hypertension echocardiogram has been done but did not show any evidence of pulmonary hypertension. She comes today to my office after surgery she is doing quite well.  She said it was very painful but overall seems to be recovering quite nicely.  Denies have any chest pain tightness squeezing pressure burning chest.  1 day she was walking tripped and broke her right foot which obviously decreased her ability to exercise but that is also getting better she anticipate knee the fact that her boot will be removed within next few days from her foot and she will be able to function more freely.  Past Medical History:  Diagnosis Date   Acquired hypothyroidism 12/13/2015   Last Assessment & Plan:   Relevant Hx:  Course:  Daily Update:  Today's Plan:update her TSH for her on her current dose of med     Electronically signed by: Krystal Clark, NP  01/11/16 [phone]   Acute diverticulitis 01/06/2022   Anemia due to antineoplastic chemotherapy 08/19/2019   Arthritis    Atherosclerosis 12/13/2015   Axillary lymphadenopathy 11/04/2018   Bilateral thumb pain 12/04/2022   Breast pain, right 11/04/2018   Bronchiectasis without complication (HCC) 07/08/2019   Bruising  09/22/2020   Change in nail appearance 02/03/2019   Formatting of this note might be different from the original. Recommend review with Onc and then Derm if not clear. Formatting of this note might be different from the original. Recommend review with Onc and then Derm if not clear.   Chemotherapy adverse reaction 03/11/2018   Chronic back pain greater than 3 months duration 09/24/2018   Chronic idiopathic constipation 03/13/2018   Chronic left shoulder pain 01/11/2021   Coronary artery calcification 11/28/2018   Formatting of this note might be different from the original. Seen on CT of chest 11/2018 Formatting of this note might be different from the original. 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) 12/13/2015   Last Assessment & Plan:   Relevant Hx:  Course:  Daily Update:  Today's Plan:we had a long discussion about med choices, she had been given onglyza samples when I saw her briefly in the lobby about 2 weeks ago to hold till she came back and did her labs, we discussed med choices with victoza and byetta which worked better for her though she could not afford them, discussed the newer meds farxiga a   Diabetes mellitus without complication (HCC)    type 2    Elevated liver function tests 02/06/2018   Essential hypertension 12/13/2015   Last Assessment & Plan:   Relevant Hx:  Course:  Daily Update:  Today's Plan:this is stable for her and will continue to follow this for her     Electronically signed by: Krystal Clark, NP  01/11/16 [phone]   Genetic testing 06/03/2018   TumorNext HRD + CancerNext germline testing was ordered.     The CancerNext gene panel offered by [employer] includes sequencing and rearrangement analysis for the following 32 genes:   APC, ATM, BARD1, BMPR1A, BRCA1, BRCA2, BRIP1, CDH1, CDK4, CDKN2A, CHEK2, DICER1, EPCAM, GREM1, HOXB13MLH1, MRE11A, MSH2,  MSH6, MUTYH, NBN, NF1, PALB2, PMS2, POLD1, POLE, PTEN, RAD50, RAD51D, SMAD4, SMARCA4, STK   GERD (gastroesophageal reflux disease)    GERD without esophagitis 12/13/2015   Goals of care, counseling/discussion 11/26/2018   High risk medication use 12/13/2015   Hypertension    Hypothyroidism    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   Intractable pain    Left lower quadrant abdominal pain 01/02/2022   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   Lumbar disc herniation 02/09/2021   Lumbosacral radiculopathy    Major depressive disorder, 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/13/2015   Malaise and fatigue 12/13/2015   Last Assessment & Plan:   Relevant Hx:  Course:  Daily Update:  Today's Plan:she is walking about 4-5 days per week and going to the gym and has gotten up to speed 4 on a level 3 , she feels her energy drain though is more sugar related and that is bothersome to her     Electronically signed by: Krystal Clark, NP  01/11/16 0913   Malignant neoplasm of left ovary (HCC)    Mixed hyperlipidemia 12/13/2015   Last Assessment & Plan:   Relevant Hx:  Course:  Daily Update:  Today's Plan:update her lipids for her today      Electronically signed by: Krystal Clark, NP  01/11/16 [phone]   Multiple lung nodules on CT 07/25/2018   Mycobacteria, 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 Probably related to to crestor.  Avoid statins. Formatting of this note might be different from the original. Probably related to to crestor.  Avoid statins.   Osteoarthritis of knee 12/04/2022   Other constipation  03/01/2018   Other fatigue 11/11/2018   Ovarian cancer (HCC) 01/31/2018   Pancytopenia, acquired (HCC) 10/25/2018   Physical debility 07/25/2018   Pneumonia    its been 4 years    PONV (postoperative nausea and vomiting)    Post-menopause 02/21/2016   Formatting of this note might be different from the original. Nl dexa 07/2020. Repeat 2025. Formatting of this note might be different from the original. Nl dexa 07/2020. Repeat 2025.   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 Seen on most recent CT scan done by oncology 11/2018 with enlarged pulmonary artery Formatting of this note might be different from the original. 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 deficiency    Vitamin B12 deficiency 12/13/2015   Weight loss 04/22/2020    Past Surgical History:  Procedure Laterality Date   BLADDER SUSPENSION  2002   BREAST SURGERY     several benign cysts removed; surgeries from 1975- 1990    CHOLECYSTECTOMY  1988   DECOMPRESSIVE LUMBAR LAMINECTOMY LEVEL 1 N/A 02/09/2021   Procedure: DECOMPRESSIVE LUMBAR LAMINECTOMY LEVEL 1 lumbar three-four;  Surgeon: Venita Lick, MD;  Location: [facility] OR;  Service: Orthopedics;  Laterality: N/A;   EYE SURGERY  2010 amd 2012   cataracts removed bilateral    IR FLUORO GUIDE PORT INSERTION RIGHT  02/28/2018   IR REMOVAL TUN ACCESS W/ PORT W/O FL MOD SED  03/28/2022   IR US GUIDE VASC ACCESS RIGHT  02/28/2018   KNEE ARTHROSCOPY Left    l knee x2   KYPHOPLASTY N/A 02/09/2021   Procedure: KYPHOPLASTY lumbar four;  Surgeon: Venita Lick, MD;  Location: [facility]  OR;  Service: Orthopedics;  Laterality: N/A;   PARATHYROIDECTOMY  2010   REVERSE SHOULDER ARTHROPLASTY Right 04/26/2023   Procedure: REVERSE SHOULDER ARTHROPLASTY;  Surgeon: Francena Hanly, MD;  Location: [facility] ORS;  Service: Orthopedics;  Laterality: Right;    ROBOTIC ASSISTED SALPINGO OOPHERECTOMY Bilateral 01/31/2018   Procedure: XI ROBOTIC ASSISTED BILATERAL SALPINGO OOPHORECTOMY, STAGING, LAPAROTOMY, PELVIC AND PARA AORTIC LYMPH NODE DISSECTION, OMENTECTOMY;  Surgeon: Shonna Chock, MD;  Location: [facility] ORS;  Service: Gynecology;  Laterality: Bilateral;   ROTATOR CUFF REPAIR  2005   right    TOTAL KNEE ARTHROPLASTY Left 12/08/2014   Procedure: LEFT TOTAL KNEE ARTHROPLASTY;  Surgeon: Jacki Cones, MD;  Location: [facility] ORS;  Service: Orthopedics;  Laterality: Left;   VAGINAL HYSTERECTOMY  1984    Current Medications: Current Meds  Medication Sig   ALPRAZolam (XANAX) 0.5 MG tablet Take 0.5 mg by mouth at bedtime.   amLODipine (NORVASC) 5 MG tablet Take 5 mg by mouth daily.   atorvastatin (LIPITOR) 10 MG tablet Take 10 mg by mouth daily.   cyanocobalamin (VITAMIN B12) 1000 MCG/ML injection Inject 1,000 mcg as directed every 30 (thirty) days.   FLUoxetine (PROZAC) 20 MG capsule Take 20 mg by mouth daily after breakfast.   hydrochlorothiazide (HYDRODIURIL) 25 MG tablet Take 25 mg by mouth daily.   levothyroxine (SYNTHROID) 88 MCG tablet Take 88 mcg by mouth daily before breakfast.   linaclotide (LINZESS) 145 MCG CAPS capsule Take 145 mcg by mouth daily before breakfast.   losartan (COZAAR) 100 MG tablet Take 100 mg by mouth at bedtime.    Magnesium 250 MG TABS Take 250 mg by mouth daily.   Melatonin 10 MG TABS Take 10 mg by mouth at bedtime.   metFORMIN (GLUCOPHAGE-XR) 500 MG 24 hr tablet Take 500 mg by mouth 2 (two) times daily.   methocarbamol (ROBAXIN) 500 MG tablet Take 1 tablet (500 mg total) by mouth every 8 (eight) hours as needed for muscle spasms.   montelukast (SINGULAIR) 10 MG  tablet Take 1 tablet (10 mg total) by mouth at bedtime. (Patient taking differently: Take 10 mg by mouth at bedtime as needed (allergies).)   naproxen (NAPROSYN) 500 MG tablet Take 1 tablet (500 mg total) by mouth 2 (two) times daily with a meal. (Patient taking differently: Take 500 mg by mouth as needed for mild pain or moderate pain.)   [DISCONTINUED]  gabapentin (NEURONTIN) 600 MG tablet Take 600 mg by mouth daily as needed (Nerve pain).   [DISCONTINUED] ondansetron (ZOFRAN) 4 MG tablet Take 1 tablet (4 mg total) by mouth every 8 (eight) hours as needed for nausea or vomiting.   [DISCONTINUED] oxyCODONE-acetaminophen (PERCOCET) 5-325 MG tablet Take 1 tablet by mouth every 4 (four) hours as needed (max 6 q). (Patient taking differently: Take 1 tablet by mouth every 4 (four) hours as needed for moderate pain or severe pain (max 6 q).)     Allergies:   Penicillins and Rosuvastatin   Social History   Socioeconomic History   Marital status: Widowed    Spouse name: Not on file   Number of children: 2   Years of education: Not on file   Highest education level: Not on file  Occupational History   Occupation: retired Print production planner  Tobacco Use   Smoking status: Never   Smokeless tobacco: Never  Vaping Use   Vaping status: Never Used  Substance and Sexual Activity   Alcohol use: No   Drug use: No   Sexual activity: Not on file  Other Topics Concern   Not on file  Social History Narrative   Not on file   Social Determinants of Health   Financial Resource Strain: Not on file  Food Insecurity: Not on file  Transportation Needs: Not on file  Physical Activity: Not on file  Stress: Not on file  Social Connections: Not on file     Family History: The patient's family history includes Bladder Cancer (age of onset: 80) in her sister; Breast cancer (age of onset: 49) in an other family member; Esophageal cancer in her maternal uncle; Leukemia in her brother and cousin; Lung cancer in  her cousin, cousin, and maternal aunt; Lymphoma (age of onset: 67) in her mother; Other (age of onset: 64) in her maternal grandfather; Prostate cancer (age of onset: 55) in her brother; Prostate cancer (age of onset: 8) in her father. There is no history of Allergic rhinitis, Angioedema, Asthma, Atopy, Eczema, Immunodeficiency, or Urticaria. ROS:   Please see the history of present illness.    All 14 point review of systems negative except as described per history of present illness  EKGs/Labs/Other Studies Reviewed:         Recent Labs: 03/22/2023: ALT 15; TSH 2.049 04/17/2023: BUN 18; Creatinine, Ser 0.79; Hemoglobin 12.4; Platelets 222; Potassium 3.6; Sodium 137  Recent Lipid Panel No results found for: "CHOL", "TRIG", "HDL", "CHOLHDL", "VLDL", "LDLCALC", "LDLDIRECT"  Physical Exam:    VS:  BP 126/64 (BP Location: Left Arm, Patient Position: Sitting)   Pulse 73   Ht 5\' 5"  (1.651 m)   Wt 167 lb 9.6 oz (76 kg)   SpO2 97%   BMI 27.89 kg/m     Wt Readings from Last 3 Encounters:  06/28/23 167 lb 9.6 oz (76 kg)  04/26/23 162 lb (73.5 kg)  04/17/23 164 lb (74.4 kg)     GEN:  Well nourished, well developed in no acute distress HEENT: Normal NECK: No JVD; No carotid bruits LYMPHATICS: No lymphadenopathy CARDIAC: RRR, no murmurs, no rubs, no gallops RESPIRATORY:  Clear to auscultation without rales, wheezing or rhonchi  ABDOMEN: Soft, non-tender, non-distended MUSCULOSKELETAL:  No edema; No deformity  SKIN: Warm and dry LOWER EXTREMITIES: no swelling NEUROLOGIC:  Alert and oriented x 3 PSYCHIATRIC:  Normal affect   ASSESSMENT:    1. Coronary artery calcification   2. Pulmonary hypertension (HCC)   3.  Essential hypertension   4. 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)    PLAN:    In order of problems listed above:  Coronary calcification was stress test negative.  The key will be risk factors modification ask her to start taking 1 baby aspirin every  single day.  She tell me that she eats a lot of Goody powder which obviously have aspirin so she will start aspirin after she finished Goody powder that she takes for pain in her right shoulder.  Her pain is getting much better. Pulmonary hypertension I did review echocardiogram from 2020 which showed normal pulmonary pressure, echocardiogram repeated in June of this year also showed normal pulmonary pressure.  I think you can drop this diagnosis I do not think she got pulmonary hypertension. Dyslipidemia: I did review her blood work done by primary care physician, her LDL was 97 in February.  Understand that at that time she was started on Lipitor 10.  She will have to have another fasting lipid profile done which will be done by primary care physician.   Medication Adjustments/Labs and Tests Ordered: Current medicines are reviewed at length with the patient today.  Concerns regarding medicines are outlined above.  No orders of the defined types were placed in this encounter.  Medication changes: No orders of the defined types were placed in this encounter.   Signed, Georgeanna Lea, MD, [facility] 06/28/2023 2:39 PM    [facility]

## 2023-07-24 ENCOUNTER — Encounter: Payer: Self-pay | Admitting: Hematology and Oncology

## 2023-07-24 ENCOUNTER — Inpatient Hospital Stay: Payer: Medicare HMO

## 2023-07-24 ENCOUNTER — Inpatient Hospital Stay: Payer: Medicare HMO | Attending: Hematology and Oncology | Admitting: Hematology and Oncology

## 2023-07-24 VITALS — BP 139/58 | HR 62 | Temp 97.5°F | Resp 18 | Ht 65.0 in | Wt 165.4 lb

## 2023-07-24 DIAGNOSIS — Z8543 Personal history of malignant neoplasm of ovary: Secondary | ICD-10-CM | POA: Insufficient documentation

## 2023-07-24 DIAGNOSIS — Z9221 Personal history of antineoplastic chemotherapy: Secondary | ICD-10-CM | POA: Diagnosis not present

## 2023-07-24 DIAGNOSIS — R5381 Other malaise: Secondary | ICD-10-CM

## 2023-07-24 DIAGNOSIS — M85851 Other specified disorders of bone density and structure, right thigh: Secondary | ICD-10-CM | POA: Diagnosis not present

## 2023-07-24 DIAGNOSIS — G620 Drug-induced polyneuropathy: Secondary | ICD-10-CM

## 2023-07-24 DIAGNOSIS — R269 Unspecified abnormalities of gait and mobility: Secondary | ICD-10-CM | POA: Insufficient documentation

## 2023-07-24 DIAGNOSIS — R7303 Prediabetes: Secondary | ICD-10-CM | POA: Insufficient documentation

## 2023-07-24 DIAGNOSIS — C562 Malignant neoplasm of left ovary: Secondary | ICD-10-CM

## 2023-07-24 DIAGNOSIS — Z7984 Long term (current) use of oral hypoglycemic drugs: Secondary | ICD-10-CM | POA: Insufficient documentation

## 2023-07-24 DIAGNOSIS — R296 Repeated falls: Secondary | ICD-10-CM | POA: Insufficient documentation

## 2023-07-24 DIAGNOSIS — G629 Polyneuropathy, unspecified: Secondary | ICD-10-CM | POA: Diagnosis not present

## 2023-07-24 LAB — CBC WITH DIFFERENTIAL/PLATELET
Abs Immature Granulocytes: 0.07 10*3/uL (ref 0.00–0.07)
Basophils Absolute: 0.1 10*3/uL (ref 0.0–0.1)
Basophils Relative: 1 %
Eosinophils Absolute: 0.2 10*3/uL (ref 0.0–0.5)
Eosinophils Relative: 2 %
HCT: 39.7 % (ref 36.0–46.0)
Hemoglobin: 12.8 g/dL (ref 12.0–15.0)
Immature Granulocytes: 1 %
Lymphocytes Relative: 26 %
Lymphs Abs: 2 10*3/uL (ref 0.7–4.0)
MCH: 30.8 pg (ref 26.0–34.0)
MCHC: 32.2 g/dL (ref 30.0–36.0)
MCV: 95.4 fL (ref 80.0–100.0)
Monocytes Absolute: 0.4 10*3/uL (ref 0.1–1.0)
Monocytes Relative: 5 %
Neutro Abs: 4.9 10*3/uL (ref 1.7–7.7)
Neutrophils Relative %: 65 %
Platelets: 288 10*3/uL (ref 150–400)
RBC: 4.16 MIL/uL (ref 3.87–5.11)
RDW: 14 % (ref 11.5–15.5)
WBC: 7.6 10*3/uL (ref 4.0–10.5)
nRBC: 0 % (ref 0.0–0.2)

## 2023-07-24 LAB — COMPREHENSIVE METABOLIC PANEL
ALT: 15 U/L (ref 0–44)
AST: 20 U/L (ref 15–41)
Albumin: 3.9 g/dL (ref 3.5–5.0)
Alkaline Phosphatase: 60 U/L (ref 38–126)
Anion gap: 7 (ref 5–15)
BUN: 20 mg/dL (ref 8–23)
CO2: 24 mmol/L (ref 22–32)
Calcium: 9.6 mg/dL (ref 8.9–10.3)
Chloride: 107 mmol/L (ref 98–111)
Creatinine, Ser: 0.83 mg/dL (ref 0.44–1.00)
GFR, Estimated: >60 mL/min (ref >60)
Glucose, Bld: 136 mg/dL — ABNORMAL HIGH (ref 70–99)
Potassium: 4.6 mmol/L (ref 3.5–5.1)
Sodium: 138 mmol/L (ref 135–145)
Total Bilirubin: 0.6 mg/dL (ref 0.3–1.2)
Total Protein: 6.7 g/dL (ref 6.5–8.1)

## 2023-07-24 NOTE — Progress Notes (Signed)
[facility] OFFICE PROGRESS NOTE  Patient Care Team: Gordan Payment., MD as PCP - General (Internal Medicine)  HISTORY OF PRESENTING ILLNESS: Discussed the use of AI scribe software for clinical note transcription with the patient, who gave verbal consent to proceed.  History of Present Illness   The patient, with a history of ovarian cancer, presents with a series of injuries and complications over the past six months. She initially fractured her shoulder, which required a reverse surgery. The surgery was delayed from April to July due to the need for a complete cardiac workup, which was ultimately unremarkable. In August, a month after the shoulder repair, the patient stood up and broke three metatarsals in her foot, leading to the use of a boot until recently.  The patient also reports worsening neuropathy, which has been causing balance issues and contributing to multiple falls. She describes a dragging sensation in her foot when walking, which she believes has led to her falls.  In addition to these issues, the patient has been dealing with osteopenia, as revealed by a recent bone density test. She is not currently taking calcium and vitamin D supplements. The patient also mentions that her B12 levels are typically on the low side and wonders if her once-a-month injection is sufficient.  The patient's lifestyle has significantly changed due to these health issues, with a notable decrease in physical activity. She expresses frustration at not being able to tend to her garden and having to rely on her son for help.       I have reviewed outside records  Assessment and Plan    Ovarian cancer No signs of recurrence Follow-up in 6 mo   Fracture History Recent shoulder fracture requiring reverse surgery and metatarsal fractures. Both injuries have healed but have significantly impacted mobility and lifestyle. -Continue current management and follow-up with orthopedic  surgeon as needed.  Osteopenia Recent DEXA scan shows osteopenia in the right femur. No current supplementation with calcium and vitamin D. -Start calcium and vitamin D supplementation. Recommended Tums for calcium and plain vitamin D.  Neuropathy Significant neuropathy causing abnormal gait and multiple falls. Plan for brace fitting and balance therapy. -Continue with planned brace fitting and balance therapy.  Prediabetes Recent HbA1c of 6.3, indicating prediabetes. Currently on metformin. -Continue metformin. Encouraged to monitor diet, particularly carbohydrate intake, to manage blood sugar levels.  Follow-up in 6 months.          No orders of the defined types were placed in this encounter.   All questions were answered. The patient knows to call the clinic with any problems, questions or concerns. The total time spent in the appointment was 30 minutes encounter with patients including review of chart and various tests results, discussions about plan of care and coordination of care plan   Artis Delay, MD 07/24/2023 10:28 AM  REVIEW OF SYSTEMS:   Constitutional: Denies fevers, chills or abnormal weight loss Eyes: Denies blurriness of vision Ears, nose, mouth, throat, and face: Denies mucositis or sore throat Respiratory: Denies cough, dyspnea or wheezes Cardiovascular: Denies palpitation, chest discomfort or lower extremity swelling Gastrointestinal:  Denies nausea, heartburn or change in bowel habits Skin: Denies abnormal skin rashes Lymphatics: Denies new lymphadenopathy or easy bruising Neurological:Denies numbness, tingling or new weaknesses Behavioral/Psych: Mood is stable, no new changes  All other systems were reviewed with the patient and are negative.  I have reviewed the past medical history, past surgical history, social history and family history with  the patient and they are unchanged from previous note.  ALLERGIES:  is allergic to penicillins and  rosuvastatin.  MEDICATIONS:  Current Outpatient Medications  Medication Sig Dispense Refill   ALPRAZolam (XANAX) 0.5 MG tablet Take 0.5 mg by mouth at bedtime.     amLODipine (NORVASC) 5 MG tablet Take 5 mg by mouth daily.     atorvastatin (LIPITOR) 10 MG tablet Take 10 mg by mouth daily.     cyanocobalamin (VITAMIN B12) 1000 MCG/ML injection Inject 1,000 mcg as directed every 30 (thirty) days.     FLUoxetine (PROZAC) 20 MG capsule Take 20 mg by mouth daily after breakfast.     hydrochlorothiazide (HYDRODIURIL) 25 MG tablet Take 25 mg by mouth daily.     levothyroxine (SYNTHROID) 88 MCG tablet Take 88 mcg by mouth daily before breakfast.     linaclotide (LINZESS) 145 MCG CAPS capsule Take 145 mcg by mouth daily before breakfast.     losartan (COZAAR) 100 MG tablet Take 100 mg by mouth at bedtime.      Magnesium 250 MG TABS Take 250 mg by mouth daily.     Melatonin 10 MG TABS Take 10 mg by mouth at bedtime.     metFORMIN (GLUCOPHAGE-XR) 500 MG 24 hr tablet Take 500 mg by mouth 2 (two) times daily.     methocarbamol (ROBAXIN) 500 MG tablet Take 1 tablet (500 mg total) by mouth every 8 (eight) hours as needed for muscle spasms. 30 tablet 1   montelukast (SINGULAIR) 10 MG tablet Take 1 tablet (10 mg total) by mouth at bedtime. (Patient taking differently: Take 10 mg by mouth at bedtime as needed (allergies).) 30 tablet 5   naproxen (NAPROSYN) 500 MG tablet Take 1 tablet (500 mg total) by mouth 2 (two) times daily with a meal. (Patient taking differently: Take 500 mg by mouth as needed for mild pain or moderate pain.) 60 tablet 1   No current facility-administered medications for this visit.    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 - TUMOR INVOLVES OVARY SURFACE AND LEFT FALLOPIAN TUBE. - SEE ONCOLOGY TABLE. 2. Mesentery, small bowel mesentery biopsy #1 - BENIGN FIBROADIPOSE TISSUE. 3. Ovary and fallopian tube, right - BENIGN OVARY WITH INCLUSION CYSTS. - BENIGN FALLOPIAN TUBE WITH PARATUBAL CYSTS AND ADENOFIBROMA. 4. Omentum, resection for tumor - BENIGN ADIPOSE TISSUE. 5. Peritoneum, biopsy, left diaphragmatic - BENIGN PERITONEAL TYPE TISSUE. 6. Peritoneum, biopsy, right diaphragmatic - BENIGN PERITONEAL TYPE TISSUE. 7. Mesentery, small bowel mesenteric biopsy #2 - BENIGN FIBROADIPOSE TISSUE. 8. Lymph nodes, regional resection, right pelvic - FIVE OF FIVE LYMPH NODES NEGATIVE FOR CARCINOMA (0/5). 9. Lymph nodes, regional resection, left pelvic - FOUR OF FOUR LYMPH NODES NEGATIVE FOR CARCINOMA (0/4). 10. Peritoneum, biopsy, left gutter - BENIGN PERITONEAL TYPE TISSUE. 11. Peritoneum, biopsy, bladder - BENIGN PERITONEAL TYPE TISSUE WITH ACUTE INFLAMMATION AND CALCIFICATIONS.  12. Peritoneum, biopsy, cul-de-sac - BENIGN PERITONEAL TYPE TISSUE WITH ACUTE INFLAMMATION AND CALCIFICATIONS. 13. Soft tissue, biopsy, right gutter - BENIGN FIBROADIPOSE TISSUE. 14. Lymph node, biopsy, right para-aortic - TWO OF TWO LYMPH NODES NEGATIVE FOR CARCINOMA (0/2). 15. Lymph node,  biopsy, left para-aortic - ONE OF ONE LYMPH NODES NEGATIVE FOR CARCINOMA (0/1). Microscopic Comment 1. OVARY Specimen(s): Left ovary and fallopian tube. Procedure: (including lymph node sampling): Bilateral salpingo-oophorectomy with omental and peritoneal biopsies and lymph node biopsies. Primary tumor site (including laterality): Left ovary. Ovarian surface involvement: Present. Ovarian capsule intact without fragmentation: Intact. Maximum tumor size (cm): 11 cm total. Histologic type: Serous  cystadenocarcinoma. Grade: High grade (low grade areas also present). Peritoneal implants: (specify invasive or non-invasive): N/A. Pelvic extension (list additional structures on separate lines and if involved): Left fallopian tube. Lymph nodes: number examined 12 ; number positive 0 TNM code: pT2a, pN0, pMX FIGO Stage (based on pathologic findings, needs clinical correlation): IIA Comments: None.   01/31/2018 Pathology Results   PERITONEAL WASHING (SPECIMEN 1 OF 1 COLLECTED 01/31/18): MALIGNANT CELLS PRESENT CONSISTENT WITH METASTATIC CARCINOMA.   01/31/2018 Surgery   Procedure(s) Performed:  1. Robotic BSO and washings. 2. Exploratory laparotomy, Staging including infragastric omentectomy, Bilateral pelvic and paraaortic lymphadenectomy, pertioneal biopsies.    Specimens: Bilateral tubes / ovaries, bilateral pelvic and paraaortic lymph nodes, peritoneal biopsies, washings and omentum.  Operative Findings: The left adnexa was adherent to the left pelvic peritoneum suspected due to the inferior retraction from the vaginal hysterectomy. Intraoperative leakage of cystic fluid from left ovary.  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  The right adnexa grossly appeared normal although slightly enlarged for her age.  There were some indurated lymph nodes but no overtly malignant lymph nodes were suspected.  Small bowel mesentery had 3-4 superficial possible implants.  1 of these was sent for frozen section returned benign.  She did have adhesive disease from her open cholecystectomy in the right upper quadrant.  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 16 mm exophytic lesion upper pole right kidney has attenuation too high to be a simple cyst. This may be a cyst complicated by proteinaceous debris or hemorrhage, but renal cell carcinoma is a concern. Routine outpatient follow-up MRI of the abdomen without and with contrast recommended after resolution of patient's acute symptoms (so she is better able to participate with positioning and breath holding). 2. Mild edema/possible minimal hemorrhage in the extraperitoneal soft tissues of the pelvic for tracking up both pelvic sidewalls. Imaging appearance is not outside of the spectrum of findings expected 6 days after the reported surgery. Trace interloop mesenteric and free fluid is identified in the peritoneal cavity in there is some mild edema in the omentum. There is no organized or rim enhancing collection to suggest evolving abscess. 3. Gas in the extraperitoneal soft tissues of the left lower quadrant is associated with gas in the subcutaneous fat of the lower left anterior abdominal wall. This is not unexpected on postoperative day 6. No evidence for intraperitoneal free air. 4. No CT features to suggest small bowel obstruction. Oral contrast material has migrated about halfway through the small bowel loops but there is no differential distention of proximal versus distal small bowel. The colon is diffusely distended and fluid-filled proximally, but formed stool is noted  in the distal colon. Given apparent slow migration of contrast and fluid-filled small and large bowel loops, a component of ileus is possible.   02/14/2018 Imaging   MR abdomen Benign Bosniak category 2 cyst in upper pole of right kidney, corresponding with lesion seen on previous CT. No evidence of renal neoplasm or other significant abnormality.     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 For last cycle, taxol was omitted   07/18/2018 Imaging   1. No evidence for residual or recurrent tumor within the abdomen or pelvis. There has been interval resolution edema and fluid within the peritoneal cavity. 2. New pulmonary nodule is identified within the right lower lobe measuring 1.1 cm, image 87/4. Consider one of the following in 3 months for both low-risk and high-risk individuals: (a) repeat chest CT, (b) follow-up PET-CT, or (c) tissue sampling. This recommendation follows the consensus statement: Guidelines for Management of Incidental Pulmonary Nodules Detected on CT Images: From the Fleischner Society 2017; Radiology 2017; 284:228-243. 3. Stable 7 mm nodule within the right upper lobe. 4.  Aortic Atherosclerosis (ICD10-I70.0). 5. Multi vessel coronary artery atherosclerotic calcifications.   07/18/2018 Tumor Marker   Patient's tumor was tested for the following markers: CA-125 Results of the tumor marker test revealed 14.9   07/25/2018 PET scan   1. Regressing right lower lobe peripheral lung lesion, likely resolving inflammatory or atelectatic process. No hypermetabolism to suggest malignancy. I would recommend a follow-up noncontrast chest CT and 4-6 months. Small upper lobe pulmonary nodules are stable. 2. 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   07/31/2018 -  Chemotherapy   The patient started taking  rucaparib   10/25/2018 Tumor Marker   Patient's tumor was tested for the following markers: CA-125 Results of the tumor marker test revealed 12.1   11/25/2018 Imaging   1. Right lower lobe nodule has resolved. Additional right lung nodules are stable. 2. Stable hazy nodularity along the left external iliac chain.  3. Hepatomegaly. 4. Aortic atherosclerosis (ICD10-170.0). Coronary artery calcification. 5. Enlarged pulmonic trunk, indicative of pulmonary arterial hypertension.   01/16/2019 Tumor Marker   Patient's tumor was tested for the following markers: CA-125 Results of the tumor marker test revealed 13.1   05/22/2019 Tumor Marker   Patient's tumor was tested for the following markers: CA-125 Results of the tumor marker test revealed 14.4   06/24/2019 Imaging   Ct chest, abdomen and pelvis 1. There are multiple clustered tiny centrilobular nodules and irregular opacities of the bilateral lower lobes and lingula, increased compared to prior examination, with underlying bandlike scarring of the medial right middle lobe and right lung base (e.g. series 4, image 100, 110, 123). 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  2. Stable 6 mm pulmonary nodule of the right upper lobe (series 4, image 45). Attention on follow-up.   3. Status post hysterectomy and oophorectomy. No evidence  of recurrent or metastatic disease in the abdomen or pelvis.   4. There are occasional sigmoid diverticula and focal fat stranding about a diverticulum of the mid sigmoid (series 2, image 103). Correlate for signs and symptoms of acute diverticulitis.   5. Other chronic, incidental, and postoperative findings as detailed above.   06/24/2019 Tumor Marker   Patient's tumor was tested for the following markers: CA-125 Results of the tumor marker test revealed 14.4.   08/19/2019 Tumor Marker   Patient's tumor was tested for the following markers: CA-125 Results of the  tumor marker test revealed 10.3   09/25/2019 Imaging   CT chest 1. Interval resolution of previously demonstrated clustered centrilobular nodularity at both lung bases consistent with resolved inflammation. Stable 6 mm right upper lobe nodule consistent with a benign finding. No new or enlarging pulmonary nodules.  2. Stable chronic bronchiectasis and scarring in the right middle lobe and lingula. 3. No evidence of metastatic disease. 4. Aortic Atherosclerosis (ICD10-I70.0).   10/30/2019 Tumor Marker   Patient's tumor was tested for the following markers: CA-125 Results of the tumor marker test revealed 12.1   12/25/2019 Tumor Marker   Patient's tumor was tested for the following markers: CA-125. Results of the tumor marker test revealed 14.8.   02/19/2020 Tumor Marker   Patient's tumor was tested for the following markers: CA-125. Results of the tumor marker test revealed 15.5   04/22/2020 Tumor Marker   Patient's tumor was tested for the following markers: CA-125 Results of the tumor marker test revealed 8.9   06/16/2020 Tumor Marker   Patient's tumor was tested for the following markers: CA-125 Results of the tumor marker test revealed 12.5   06/23/2020 Imaging   1. Stable exam. No new or progressive findings to suggest recurrent or metastatic disease. 2. Aortic Atherosclerosis (ICD10-I70.0).   08/19/2020 Tumor Marker   Patient's tumor was tested for the following markers: CA-125 Results of the tumor marker test revealed 19.1   09/21/2020 Tumor Marker   Patient's tumor was tested for the following markers: CA-125 Results of the tumor marker test revealed 12.7.   11/17/2020 Tumor Marker   Patient's tumor was tested for the following markers: CA-125 Results of the tumor marker test revealed 14.7   01/11/2021 Tumor Marker   Patient's tumor was tested for the following markers: CA-125 Results of the tumor marker test revealed 11.6   02/24/2021 Tumor Marker   Patient's tumor was  tested for the following markers: CA125. Results of the tumor marker test revealed 11.8   04/21/2021 Tumor Marker   Patient's tumor was tested for the following markers: CA-125. Results of the tumor marker test revealed 10.9.   06/24/2021 Tumor Marker   Patient's tumor was tested for the following markers: CA-125. Results of the tumor marker test revealed 12.9.   08/11/2021 Tumor Marker   Patient's tumor was tested for the following markers: CA-125. Results of the tumor marker test revealed 12.6.   11/30/2021 Tumor Marker   Patient's tumor was tested for the following markers: CA-125. Results of the tumor marker test revealed 13.0.   01/02/2022 Tumor Marker   Patient's tumor was tested for the following markers: CA-125. Results of the tumor marker test revealed 11.   01/05/2022 Imaging   1. Non complicated descending and sigmoid diverticulitis. 2. No evidence of metastatic disease within the abdomen or pelvis. 3.  Aortic Atherosclerosis (ICD10-I70.0). 4. Upper pole right renal lesion demonstrates complexity, but was characterized as a complex cyst on 02/14/2018  abdominal MRI.     03/20/2022 Tumor Marker   Patient's tumor was tested for the following markers: CA-125. Results of the tumor marker test revealed 12.5.   03/28/2022 Procedure   Removal of implanted Port-A-Cath utilizing sharp and blunt dissection. The procedure was uncomplicated.   06/22/2022 Tumor Marker   Patient's tumor was tested for the following markers: CA-125. Results of the tumor marker test revealed 12.9.   09/21/2022 Tumor Marker   Patient's tumor was tested for the following markers: CA-125. Results of the tumor marker test revealed 15.6.   12/18/2022 Tumor Marker   Patient's tumor was tested for the following markers: CA-125. Results of the tumor marker test revealed 14.1.   12/18/2022 Imaging   No evidence of recurrent or metastatic carcinoma within the abdomen or pelvis.   Sigmoid diverticulosis. No  radiographic evidence of diverticulitis.   Aortic Atherosclerosis (ICD10-I70.0).   03/22/2023 Tumor Marker   Patient's tumor was tested for the following markers: CA-125. Results of the tumor marker test revealed 13.8.     PHYSICAL EXAMINATION: ECOG PERFORMANCE STATUS: 1 - Symptomatic but completely ambulatory  Vitals:   07/24/23 0950  BP: (!) 139/58  Pulse: 62  Resp: 18  Temp: (!) 97.5 F (36.4 C)  SpO2: 100%   Filed Weights   07/24/23 0950  Weight: 165 lb 6.4 oz (75 kg)    GENERAL:alert, no distress and comfortable   LABORATORY DATA:  I have reviewed the data as listed    Component Value Date/Time   NA 138 07/24/2023 0931   K 4.6 07/24/2023 0931   CL 107 07/24/2023 0931   CO2 24 07/24/2023 0931   GLUCOSE 136 (H) 07/24/2023 0931   BUN 20 07/24/2023 0931   CREATININE 0.83 07/24/2023 0931   CREATININE 0.85 12/15/2022 0912   CALCIUM 9.6 07/24/2023 0931   PROT 6.7 07/24/2023 0931   ALBUMIN 3.9 07/24/2023 0931   AST 20 07/24/2023 0931   AST 23 12/15/2022 0912   ALT 15 07/24/2023 0931   ALT 25 12/15/2022 0912   ALKPHOS 60 07/24/2023 0931   BILITOT 0.6 07/24/2023 0931   BILITOT 0.5 12/15/2022 0912   GFRNONAA >60 07/24/2023 0931   GFRNONAA >60 12/15/2022 0912   GFRAA >60 06/16/2020 0918   GFRAA >60 02/13/2019 1024    No results found for: "SPEP", "UPEP"  Lab Results  Component Value Date   WBC 7.6 07/24/2023   NEUTROABS 4.9 07/24/2023   HGB 12.8 07/24/2023   HCT 39.7 07/24/2023   MCV 95.4 07/24/2023   PLT 288 07/24/2023      Chemistry      Component Value Date/Time   NA 138 07/24/2023 0931   K 4.6 07/24/2023 0931   CL 107 07/24/2023 0931   CO2 24 07/24/2023 0931   BUN 20 07/24/2023 0931   CREATININE 0.83 07/24/2023 0931   CREATININE 0.85 12/15/2022 0912      Component Value Date/Time   CALCIUM 9.6 07/24/2023 0931   ALKPHOS 60 07/24/2023 0931   AST 20 07/24/2023 0931   AST 23 12/15/2022 0912   ALT 15 07/24/2023 0931   ALT 25 12/15/2022 0912    BILITOT 0.6 07/24/2023 0931   BILITOT 0.5 12/15/2022 0912

## 2023-07-25 LAB — CA 125: Cancer Antigen (CA) 125: 15.1 U/mL (ref 0.0–38.1)

## 2023-07-26 DIAGNOSIS — M67969 Unspecified disorder of synovium and tendon, unspecified lower leg: Secondary | ICD-10-CM | POA: Insufficient documentation

## 2023-07-26 HISTORY — DX: Unspecified disorder of synovium and tendon, unspecified lower leg: M67.969

## 2023-08-23 DIAGNOSIS — R262 Difficulty in walking, not elsewhere classified: Secondary | ICD-10-CM

## 2023-08-23 DIAGNOSIS — M2141 Flat foot [pes planus] (acquired), right foot: Secondary | ICD-10-CM | POA: Insufficient documentation

## 2023-08-23 HISTORY — DX: Flat foot (pes planus) (acquired), right foot: M21.41

## 2023-08-23 HISTORY — DX: Difficulty in walking, not elsewhere classified: R26.2

## 2023-08-30 DIAGNOSIS — M19012 Primary osteoarthritis, left shoulder: Secondary | ICD-10-CM | POA: Insufficient documentation

## 2023-10-01 ENCOUNTER — Other Ambulatory Visit (HOSPITAL_COMMUNITY): Payer: Self-pay

## 2023-10-01 DIAGNOSIS — M25562 Pain in left knee: Secondary | ICD-10-CM

## 2023-10-01 HISTORY — DX: Pain in left knee: M25.562

## 2023-10-05 ENCOUNTER — Telehealth: Payer: Self-pay | Admitting: Cardiology

## 2023-10-05 ENCOUNTER — Telehealth: Payer: Self-pay

## 2023-10-05 NOTE — Telephone Encounter (Signed)
Spoke with patient who is agreeable to do a tele visit on 12/24/23 at 9 am. Consent given, med rec to be completed.

## 2023-10-05 NOTE — Telephone Encounter (Signed)
   Name: [name]  DOB: 26-Aug-1941  MRN: [id number]  Primary Cardiologist: None   Preoperative team, please contact this patient and set up a phone call appointment for further preoperative risk assessment. Please obtain consent and complete medication review. Thank you for your help.  Patient's surgical date is 01/14/2024  I confirm that guidance regarding antiplatelet and oral anticoagulation therapy has been completed and, if necessary, noted below.  None  I also confirmed the patient resides in the state of [place]. As per [facility] Medical Board telemedicine laws, the patient must reside in the state in which the provider is licensed.   Napoleon Form, Leodis Rains, NP 10/05/2023, 2:57 PM [facility]

## 2023-10-05 NOTE — Telephone Encounter (Signed)
  Patient Consent for Virtual Visit        [name] has provided verbal consent on 10/05/2023 for a virtual visit (video or telephone).   CONSENT FOR VIRTUAL VISIT FOR:  [name]  By participating in this virtual visit I agree to the following:  I hereby voluntarily request, consent and authorize [facility] and its employed or contracted physicians, physician assistants, nurse practitioners or other licensed health care professionals (the Practitioner), to provide me with telemedicine health care services (the "Services") as deemed necessary by the treating Practitioner. I acknowledge and consent to receive the Services by the Practitioner via telemedicine. I understand that the telemedicine visit will involve communicating with the Practitioner through live audiovisual communication technology and the disclosure of certain medical information by electronic transmission. I acknowledge that I have been given the opportunity to request an in-person assessment or other available alternative prior to the telemedicine visit and am voluntarily participating in the telemedicine visit.  I understand that I have the right to withhold or withdraw my consent to the use of telemedicine in the course of my care at any time, without affecting my right to future care or treatment, and that the Practitioner or I may terminate the telemedicine visit at any time. I understand that I have the right to inspect all information obtained and/or recorded in the course of the telemedicine visit and may receive copies of available information for a reasonable fee.  I understand that some of the potential risks of receiving the Services via telemedicine include:  Delay or interruption in medical evaluation due to technological equipment failure or disruption; Information transmitted may not be sufficient (e.g. poor resolution of images) to allow for appropriate medical decision making by the Practitioner;  and/or  In rare instances, security protocols could fail, causing a breach of personal health information.  Furthermore, I acknowledge that it is my responsibility to provide information about my medical history, conditions and care that is complete and accurate to the best of my ability. I acknowledge that Practitioner's advice, recommendations, and/or decision may be based on factors not within their control, such as incomplete or inaccurate data provided by me or distortions of diagnostic images or specimens that may result from electronic transmissions. I understand that the practice of medicine is not an exact science and that Practitioner makes no warranties or guarantees regarding treatment outcomes. I acknowledge that a copy of this consent can be made available to me via my patient portal [facility] MyChart), or I can request a printed copy by calling the office of  [facility].    I understand that my insurance will be billed for this visit.   I have read or had this consent read to me. I understand the contents of this consent, which adequately explains the benefits and risks of the Services being provided via telemedicine.  I have been provided ample opportunity to ask questions regarding this consent and the Services and have had my questions answered to my satisfaction. I give my informed consent for the services to be provided through the use of telemedicine in my medical care

## 2023-10-05 NOTE — Telephone Encounter (Signed)
   Pre-operative Risk Assessment  Last visit: 06/28/2023 Next visit: none   Patient Name: [name]  DOB: 12/29/1940 MRN: [id number]      Request for Surgical Clearance    Procedure:   right total knee arthroplasty  Date of Surgery:  Clearance 01/14/24                                 Surgeon:  Dr. Ollen Gross Surgeon's Group or Practice Name:  [facility] Phone number:  [phone] Fax number:  [phone]   Type of Clearance Requested:   - Medical    Type of Anesthesia:   choice   Additional requests/questions:    SignedRoyann Shivers   10/05/2023, 2:41 PM

## 2023-10-22 ENCOUNTER — Other Ambulatory Visit (HOSPITAL_COMMUNITY): Payer: Self-pay

## 2023-10-22 ENCOUNTER — Other Ambulatory Visit (HOSPITAL_BASED_OUTPATIENT_CLINIC_OR_DEPARTMENT_OTHER): Payer: Self-pay

## 2023-10-22 MED ORDER — GLIMEPIRIDE 1 MG PO TABS
ORAL_TABLET | ORAL | 0 refills | Status: DC
  Filled 2023-10-22: qty 90, 60d supply, fill #0
  Filled 2023-10-23: qty 90, 90d supply, fill #0

## 2023-10-22 MED ORDER — ATORVASTATIN CALCIUM 10 MG PO TABS
10.0000 mg | ORAL_TABLET | Freq: Every day | ORAL | 0 refills | Status: DC
  Filled 2023-10-22 – 2024-01-28 (×3): qty 90, 90d supply, fill #0

## 2023-10-22 MED ORDER — ALPRAZOLAM 0.5 MG PO TABS
0.5000 mg | ORAL_TABLET | Freq: Two times a day (BID) | ORAL | 1 refills | Status: DC | PRN
  Filled 2023-10-22 – 2023-10-23 (×2): qty 90, 90d supply, fill #0
  Filled 2024-01-28: qty 90, 90d supply, fill #1

## 2023-10-22 MED ORDER — AMLODIPINE BESYLATE 5 MG PO TABS
5.0000 mg | ORAL_TABLET | Freq: Every day | ORAL | 0 refills | Status: DC
  Filled 2023-10-22 – 2024-01-28 (×3): qty 90, 90d supply, fill #0

## 2023-10-22 MED ORDER — LINZESS 145 MCG PO CAPS
145.0000 ug | ORAL_CAPSULE | Freq: Every day | ORAL | 0 refills | Status: DC
  Filled 2023-10-22 – 2023-12-10 (×2): qty 90, 90d supply, fill #0

## 2023-10-22 MED ORDER — LEVOTHYROXINE SODIUM 88 MCG PO TABS
88.0000 ug | ORAL_TABLET | Freq: Every day | ORAL | 0 refills | Status: DC
  Filled 2023-10-22 – 2024-01-08 (×2): qty 90, 90d supply, fill #0

## 2023-10-22 MED ORDER — LOSARTAN POTASSIUM 100 MG PO TABS
100.0000 mg | ORAL_TABLET | Freq: Every day | ORAL | 0 refills | Status: DC
  Filled 2023-10-22 – 2024-01-08 (×2): qty 90, 90d supply, fill #0

## 2023-10-22 MED ORDER — METFORMIN HCL ER 500 MG PO TB24
500.0000 mg | ORAL_TABLET | Freq: Two times a day (BID) | ORAL | 0 refills | Status: DC
  Filled 2023-10-22 – 2024-01-08 (×2): qty 180, 90d supply, fill #0

## 2023-10-22 MED ORDER — FLUOXETINE HCL 20 MG PO CAPS
20.0000 mg | ORAL_CAPSULE | Freq: Every day | ORAL | 0 refills | Status: DC
  Filled 2023-10-22 – 2024-01-08 (×3): qty 90, 90d supply, fill #0

## 2023-10-23 ENCOUNTER — Other Ambulatory Visit (HOSPITAL_BASED_OUTPATIENT_CLINIC_OR_DEPARTMENT_OTHER): Payer: Self-pay

## 2023-10-23 ENCOUNTER — Other Ambulatory Visit (HOSPITAL_COMMUNITY): Payer: Self-pay

## 2023-10-24 ENCOUNTER — Other Ambulatory Visit (HOSPITAL_BASED_OUTPATIENT_CLINIC_OR_DEPARTMENT_OTHER): Payer: Self-pay

## 2023-10-25 ENCOUNTER — Other Ambulatory Visit (HOSPITAL_BASED_OUTPATIENT_CLINIC_OR_DEPARTMENT_OTHER): Payer: Self-pay

## 2023-11-21 ENCOUNTER — Other Ambulatory Visit (HOSPITAL_COMMUNITY): Payer: Self-pay

## 2023-11-22 ENCOUNTER — Other Ambulatory Visit (HOSPITAL_BASED_OUTPATIENT_CLINIC_OR_DEPARTMENT_OTHER): Payer: Self-pay

## 2023-11-22 ENCOUNTER — Other Ambulatory Visit (HOSPITAL_COMMUNITY): Payer: Self-pay

## 2023-11-22 MED ORDER — ONETOUCH ULTRA TEST VI STRP
ORAL_STRIP | 5 refills | Status: DC
  Filled 2023-11-22: qty 100, 90d supply, fill #0
  Filled 2023-11-23 (×2): qty 100, 100d supply, fill #0

## 2023-11-22 MED ORDER — ONETOUCH ULTRA 2 W/DEVICE KIT
PACK | 0 refills | Status: DC
  Filled 2023-11-22: qty 1, 30d supply, fill #0
  Filled 2023-11-23 (×2): qty 1, 1d supply, fill #0

## 2023-11-23 ENCOUNTER — Other Ambulatory Visit: Payer: Self-pay

## 2023-11-23 ENCOUNTER — Other Ambulatory Visit (HOSPITAL_BASED_OUTPATIENT_CLINIC_OR_DEPARTMENT_OTHER): Payer: Self-pay

## 2023-11-23 ENCOUNTER — Other Ambulatory Visit (HOSPITAL_COMMUNITY): Payer: Self-pay

## 2023-11-27 ENCOUNTER — Other Ambulatory Visit (HOSPITAL_COMMUNITY): Payer: Self-pay

## 2023-11-27 MED ORDER — ACCU-CHEK GUIDE TEST VI STRP
ORAL_STRIP | 5 refills | Status: DC
  Filled 2023-11-27: qty 100, 100d supply, fill #0

## 2023-12-10 ENCOUNTER — Other Ambulatory Visit: Payer: Self-pay

## 2023-12-10 ENCOUNTER — Ambulatory Visit (INDEPENDENT_AMBULATORY_CARE_PROVIDER_SITE_OTHER)
Admit: 2023-12-10 | Discharge: 2023-12-10 | Disposition: A | Discharge status: Home / Self Care | Payer: PPO | Attending: Family Medicine | Admitting: Family Medicine

## 2023-12-10 ENCOUNTER — Encounter (HOSPITAL_BASED_OUTPATIENT_CLINIC_OR_DEPARTMENT_OTHER): Payer: Self-pay

## 2023-12-10 ENCOUNTER — Other Ambulatory Visit (HOSPITAL_COMMUNITY): Payer: Self-pay

## 2023-12-10 ENCOUNTER — Other Ambulatory Visit (HOSPITAL_BASED_OUTPATIENT_CLINIC_OR_DEPARTMENT_OTHER): Payer: Self-pay

## 2023-12-10 ENCOUNTER — Ambulatory Visit (HOSPITAL_BASED_OUTPATIENT_CLINIC_OR_DEPARTMENT_OTHER)
Admission: EM | Admit: 2023-12-10 | Discharge: 2023-12-10 | Disposition: A | Discharge status: Home / Self Care | Payer: PPO | Attending: Family Medicine | Admitting: Family Medicine

## 2023-12-10 DIAGNOSIS — R051 Acute cough: Secondary | ICD-10-CM | POA: Diagnosis not present

## 2023-12-10 DIAGNOSIS — J069 Acute upper respiratory infection, unspecified: Secondary | ICD-10-CM

## 2023-12-10 LAB — POC COVID19/FLU A&B COMBO
Covid Antigen, POC: NEGATIVE
Influenza A Antigen, POC: NEGATIVE
Influenza B Antigen, POC: NEGATIVE

## 2023-12-10 MED ORDER — PROMETHAZINE-DM 6.25-15 MG/5ML PO SYRP
5.0000 mL | ORAL_SOLUTION | Freq: Four times a day (QID) | ORAL | 0 refills | Status: DC | PRN
  Filled 2023-12-10: qty 118, 6d supply, fill #0

## 2023-12-10 NOTE — Discharge Instructions (Addendum)
 Exam and history are consistent with a viral upper respiratory infection.  Chest x-ray is clear.  Will contact patient if radiology review differs.  Encouraged use of wellness formula, 2 pills, twice daily.  Provided Promethazine DM, 5 mL, every 6 hours, as needed for cough.  Get plenty of fluids and rest.  I would advise you to stay away from people who are at risk for at least 3 to 5 days.

## 2023-12-10 NOTE — ED Triage Notes (Signed)
 Onset of runny nose and cough Friday. Taking over the counter decongestants with no help drying up secretions. Patient states she choked on hamburger Friday night and thinks she aspirated some of the hamburger.

## 2023-12-10 NOTE — Progress Notes (Signed)
 Chest X-Ray IMPRESSION:   No active cardiopulmonary disease.  Patient updated.

## 2023-12-10 NOTE — ED Provider Notes (Signed)
 [facility]    CSN: [id number] Arrival date & time: 12/10/23  1141      History   Chief Complaint Chief Complaint  Patient presents with   Nasal Congestion    HPI [name] is a 83 y.o. female.   Patient reports acute onset of runny nose and cough on Friday, 12/07/2023 prior to sleep.  She took over-the-counter decongestants and she is taking allergy medications and she still just continues to have copious nasal secretions.  Friday evening before the runny nose started she was eating her supper and she aspirated a small piece of hamburger patty.  She coughed and coughed and coughed and she does not know if she got the piece of hamburger up or not but she certainly felt to go down the wrong way which caused all the coughing.  Runny nose started a couple hours after the aspiration event.  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  However she cannot be around these family members if she has symptom viral.  She is wants to get checked out either cautioned to stay home or get a clean bill of health.     Past Medical History:  Diagnosis Date   Acquired hypothyroidism 12/13/2015   Last Assessment & Plan:   Relevant Hx:  Course:  Daily Update:  Today's Plan:update her TSH for her on her current dose of med     Electronically signed by: Krystal Clark, NP  01/11/16 [id number]   Acute diverticulitis 01/06/2022   Anemia due to antineoplastic chemotherapy 08/19/2019   Arthritis    Atherosclerosis 12/13/2015   Axillary lymphadenopathy 11/04/2018   Bilateral thumb pain 12/04/2022   Breast pain, right 11/04/2018   Bronchiectasis without complication (HCC) 07/08/2019   Bruising 09/22/2020   Change in nail appearance 02/03/2019   Formatting of this note might be different from the  original. Recommend review with Onc and then Derm if not clear. Formatting of this note might be different from the original. Recommend review with Onc and then Derm if not clear.   Chemotherapy adverse reaction 03/11/2018   Chronic back pain greater than 3 months duration 09/24/2018   Chronic idiopathic constipation 03/13/2018   Chronic left shoulder pain 01/11/2021   Coronary artery calcification 11/28/2018   Formatting of this note might be different from the original. Seen on CT of chest 11/2018 Formatting of this note might be different from the original. 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) 12/13/2015   Last Assessment & Plan:   Relevant Hx:  Course:  Daily Update:  Today's Plan:we had a long discussion about med choices, she had been given onglyza samples when I saw her briefly in the lobby about 2 weeks ago to hold till she came back and did her labs, we discussed med choices with victoza and byetta which worked better for her though she could not afford them, discussed the newer meds farxiga a   Diabetes mellitus without complication (HCC)    type 2    Elevated liver function tests 02/06/2018   Essential hypertension 12/13/2015   Last Assessment & Plan:   Relevant Hx:  Course:  Daily Update:  Today's Plan:this is  stable for her and will continue to follow this for her     Electronically signed by: Krystal Clark, NP  01/11/16 [phone]   Genetic testing 06/03/2018   TumorNext HRD + CancerNext germline testing was ordered.     The CancerNext gene panel offered by 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   Intractable pain    Left lower quadrant abdominal pain 01/02/2022   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   Lumbar disc herniation 02/09/2021   Lumbosacral radiculopathy    Major depressive disorder, 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/13/2015   Malaise and fatigue 12/13/2015   Last Assessment & Plan:   Relevant Hx:  Course:  Daily Update:  Today's Plan:she is walking about 4-5 days per week and going to the gym and has gotten up to speed 4 on a level 3 , she feels her energy drain though is more sugar related and that is bothersome to her     Electronically signed by: Krystal Clark, NP  01/11/16 0913   Malignant neoplasm of left ovary (HCC)    Mixed hyperlipidemia 12/13/2015   Last Assessment & Plan:   Relevant Hx:  Course:  Daily Update:  Today's Plan:update her lipids for her today      Electronically signed by: Krystal Clark, NP  01/11/16 [id number]   Multiple lung nodules on CT 07/25/2018   Mycobacteria, 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 Probably related to to crestor.  Avoid statins. Formatting of this note might be different from the original. Probably related to to crestor.  Avoid statins.   Osteoarthritis of knee 12/04/2022   Other constipation 03/01/2018   Other fatigue 11/11/2018   Ovarian cancer (HCC) 01/31/2018   Pancytopenia, acquired  (HCC) 10/25/2018   Physical debility 07/25/2018   Pneumonia    its been 4 years    PONV (postoperative nausea and vomiting)    Post-menopause 02/21/2016   Formatting of this note might be different from the original. Nl dexa 07/2020. Repeat 2025. Formatting of this note might be different from the original. Nl dexa 07/2020. Repeat 2025.   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 Seen on most recent CT scan done by oncology 11/2018 with enlarged pulmonary artery Formatting of this note might be different from the original. 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   Bilateral thumb pain 12/04/2022   Osteoarthritis of knee 12/04/2022   Acute diverticulitis 01/06/2022   Left lower quadrant abdominal pain 01/02/2022   Lumbar disc herniation 02/09/2021   Lumbosacral radiculopathy    Intractable pain    Right hip pain 02/07/2021   Chronic left shoulder pain 01/11/2021   Bruising 09/22/2020   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   Weight loss 84/13/2440   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   Preventive measure 10/30/2019   Anemia due to antineoplastic chemotherapy 08/19/2019   Bronchiectasis without complication (HCC) 07/08/2019   Mycobacteria, 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   UTI (urinary  tract infection) 05/13/2019   Change in nail appearance 02/03/2019   Pulmonary hypertension (HCC) 11/28/2018   Coronary artery calcification 11/28/2018   Goals of care, counseling/discussion 11/26/2018   Other fatigue 11/11/2018   Breast pain, right 11/04/2018   Axillary lymphadenopathy 11/04/2018   Pancytopenia, acquired (HCC) 10/25/2018   Chronic back pain greater than 3 months duration 09/24/2018   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   Physical debility 07/25/2018   Multiple lung nodules on CT 07/25/2018   Genetic testing 06/03/2018   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   Chronic idiopathic constipation 03/13/2018   Chemotherapy adverse reaction 03/11/2018   Other constipation 03/01/2018   Elevated liver function tests 02/06/2018   Malignant neoplasm of left ovary (HCC)    Post-menopause 02/21/2016   Obesity (BMI 30-39.9) 12/14/2015   Acquired hypothyroidism 12/13/2015   Atherosclerosis 12/13/2015   Degeneration of lumbar intervertebral disc 12/13/2015   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) 12/13/2015   Essential hypertension 12/13/2015   GERD without esophagitis 12/13/2015   High risk medication use 12/13/2015   Major depressive disorder, 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/13/2015   Malaise and fatigue 12/13/2015   Mixed hyperlipidemia 12/13/2015   Posterior tibial tendonitis 12/13/2015   Primary insomnia 12/13/2015   Primary osteoarthritis involving multiple joints 12/13/2015   Vitamin B12 deficiency 12/13/2015   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    Past Surgical History:  Procedure Laterality Date   BLADDER SUSPENSION  2002   BREAST SURGERY     several benign cysts removed; surgeries from 1975- 1990    CHOLECYSTECTOMY  1988   DECOMPRESSIVE LUMBAR LAMINECTOMY LEVEL 1 N/A 02/09/2021   Procedure: DECOMPRESSIVE LUMBAR LAMINECTOMY LEVEL 1 lumbar three-four;  Surgeon: Venita Lick, MD;  Location: [facility] OR;  Service: Orthopedics;  Laterality:  N/A;   EYE SURGERY  2010 amd 2012   cataracts removed bilateral    IR FLUORO GUIDE PORT INSERTION RIGHT  02/28/2018   IR REMOVAL TUN ACCESS W/ PORT W/O FL MOD SED  03/28/2022   IR US GUIDE VASC ACCESS RIGHT  02/28/2018   KNEE ARTHROSCOPY Left    l knee x2   KYPHOPLASTY N/A 02/09/2021   Procedure: KYPHOPLASTY lumbar four;  Surgeon: Venita Lick, MD;  Location: [facility] OR;  Service: Orthopedics;  Laterality: N/A;   PARATHYROIDECTOMY  2010   REVERSE SHOULDER ARTHROPLASTY Right 04/26/2023   Procedure: REVERSE SHOULDER ARTHROPLASTY;  Surgeon: Francena Hanly, MD;  Location:  [facility] ORS;  Service: Orthopedics;  Laterality: Right;    ROBOTIC ASSISTED SALPINGO OOPHERECTOMY Bilateral 01/31/2018   Procedure: XI ROBOTIC ASSISTED BILATERAL SALPINGO OOPHORECTOMY, STAGING, LAPAROTOMY, PELVIC AND PARA AORTIC LYMPH NODE DISSECTION, OMENTECTOMY;  Surgeon: Shonna Chock, MD;  Location: [facility];  Service: Gynecology;  Laterality: Bilateral;   ROTATOR CUFF REPAIR  2005   right    TOTAL KNEE ARTHROPLASTY Left 12/08/2014   Procedure: LEFT TOTAL KNEE ARTHROPLASTY;  Surgeon: Jacki Cones, MD;  Location: [facility] OR[facility];  Service: Orthopedics;  Laterality: Left;   VAGINAL HYSTERECTOMY  1984    OB History   No obstetric history on file.      Home Medications    Prior to Admission medications   Medication Sig Start Date End Date Taking? Authorizing Provider  promethazine-dextromethorphan (PROMETHAZINE-DM) 6.25-15 MG/5ML syrup Take 5 mLs by mouth 4 (four) times daily as needed for cough. Do not use and drive - May make drowsy. 12/10/23  Yes Prescilla Sours, FNP  ALPRAZolam Prudy Feeler) 0.5 MG tablet Take 0.5 mg by mouth at bedtime.    [provider]  ALPRAZolam Prudy Feeler) 0.5 MG tablet Take 1 tablet (0.5 mg total) by mouth every 12 (twelve) hours as needed. Medication refills require office visit every 6 months. 10/22/23     amLODipine (NORVASC) 5 MG tablet Take 5 mg by mouth daily. 01/05/20   [provider]   amLODipine (NORVASC) 5 MG tablet Take 1 tablet (5 mg total) by mouth daily. 10/22/23     atorvastatin (LIPITOR) 10 MG tablet Take 10 mg by mouth daily. 12/25/21   [provider]  atorvastatin (LIPITOR) 10 MG tablet Take 1 tablet (10 mg total) by mouth daily. 10/22/23     Blood Glucose Monitoring Suppl (ONE TOUCH ULTRA 2) w/Device KIT Use as directed 11/22/23     cyanocobalamin (VITAMIN B12) 1000 MCG/ML injection Inject 1,000 mcg as directed every 30 (thirty) days.    [provider]  FLUoxetine (PROZAC) 20 MG capsule Take 20 mg by mouth daily after breakfast.    [provider]  FLUoxetine (PROZAC) 20 MG capsule Take 1 capsule (20 mg total) by mouth daily. 10/22/23     glimepiride (AMARYL) 1 MG tablet Take 1 tablet (1 mg total) by mouth every morning before breakfast. Take 1\2 tablet prn after steroid injection. 10/22/23     glucose blood (ACCU-CHEK GUIDE TEST) test strip Use as instructed 11/27/23     hydrochlorothiazide (HYDRODIURIL) 25 MG tablet Take 25 mg by mouth daily.    [provider]  levothyroxine (SYNTHROID) 88 MCG tablet Take 88 mcg by mouth daily before breakfast. 03/05/21   [provider]  levothyroxine (SYNTHROID) 88 MCG tablet Take 1 tablet (88 mcg total) by mouth daily. 10/22/23     linaclotide (LINZESS) 145 MCG CAPS capsule Take 145 mcg by mouth daily before breakfast.    [provider]  LINZESS 145 MCG CAPS capsule Take 1 capsule (145 mcg total) by mouth daily. 10/22/23     losartan (COZAAR) 100 MG tablet Take 100 mg by mouth at bedtime.     [provider]  losartan (COZAAR) 100 MG tablet Take 1 tablet (100 mg total) by mouth daily. 10/22/23     Magnesium 250 MG TABS Take 250 mg by mouth daily.    [provider]  Melatonin 10 MG TABS Take 10 mg by mouth at bedtime.    [provider]  metFORMIN (GLUCOPHAGE-XR) 500 MG 24 hr tablet Take  500 mg by mouth 2 (two) times daily.    [provider]  metFORMIN  (GLUCOPHAGE-XR) 500 MG 24 hr tablet Take 1 tablet (500 mg total) by mouth in the morning and at bedtime. 10/22/23     methocarbamol (ROBAXIN) 500 MG tablet Take 1 tablet (500 mg total) by mouth every 8 (eight) hours as needed for muscle spasms. 04/26/23   Shuford, French Ana, PA-C  montelukast (SINGULAIR) 10 MG tablet Take 1 tablet (10 mg total) by mouth at bedtime. Patient taking differently: Take 10 mg by mouth at bedtime as needed (allergies). 07/17/22   Kozlow, Alvira Philips, MD  naproxen (NAPROSYN) 500 MG tablet Take 1 tablet (500 mg total) by mouth 2 (two) times daily with a meal. Patient taking differently: Take 500 mg by mouth as needed for mild pain or moderate pain. 04/26/23   Shuford, French Ana, PA-C    Family History Family History  Problem Relation Age of Onset   Lymphoma Mother 54   Bladder Cancer Sister 49   Prostate cancer Brother 21       has surgery right away after dx   Leukemia Brother        62's   Breast cancer Other 47       niece, GT 2017 reportedly neg   Prostate cancer Father 8       found on autopsy at death (19)   Lung cancer Maternal Aunt    Esophageal cancer Maternal Uncle    Other Maternal Grandfather 74       cerebral hemmhorage   Lung cancer Cousin    Lung cancer Cousin    Leukemia Cousin    Allergic rhinitis Neg Hx    Angioedema Neg Hx    Asthma Neg Hx    Atopy Neg Hx    Eczema Neg Hx    Immunodeficiency Neg Hx    Urticaria Neg Hx     Social History Social History   Tobacco Use   Smoking status: Never   Smokeless tobacco: Never  Vaping Use   Vaping status: Never Used  Substance Use Topics   Alcohol use: No   Drug use: No     Allergies   Penicillins and Rosuvastatin   Review of Systems Review of Systems  Constitutional:  Negative for chills and fever.  HENT:  Positive for congestion, postnasal drip and rhinorrhea. Negative for ear pain and sore throat.   Eyes:  Negative for pain and visual disturbance.  Respiratory:  Positive for cough and  chest tightness. Negative for shortness of breath.   Cardiovascular:  Negative for chest pain and palpitations.  Gastrointestinal:  Negative for abdominal pain, constipation, diarrhea, nausea and vomiting.  Genitourinary:  Negative for dysuria and hematuria.  Musculoskeletal:  Negative for arthralgias and back pain.  Skin:  Negative for color change and rash.  Neurological:  Negative for seizures and syncope.  All other systems reviewed and are negative.    Physical Exam Triage Vital Signs ED Triage Vitals  Encounter Vitals Group     BP 12/10/23 1249 (!) 156/52     Systolic BP Percentile --      Diastolic BP Percentile --      Pulse Rate 12/10/23 1249 69     Resp 12/10/23 1249 20     Temp 12/10/23 1249 98.7 F (37.1 C)     Temp Source 12/10/23 1249 Oral     SpO2 12/10/23 1249 97 %     Weight --  Height --      Head Circumference --      Peak Flow --      Pain Score 12/10/23 1251 0     Pain Loc --      Pain Education --      Exclude from Growth Chart --    No data found.  Updated Vital Signs BP (!) 156/52 (BP Location: Right Arm)   Pulse 69   Temp 98.7 F (37.1 C) (Oral)   Resp 20   SpO2 97%   Visual Acuity Right Eye Distance:   Left Eye Distance:   Bilateral Distance:    Right Eye Near:   Left Eye Near:    Bilateral Near:     Physical Exam Vitals and nursing note reviewed.  Constitutional:      General: She is not in acute distress.    Appearance: She is well-developed. She is not ill-appearing or toxic-appearing.  HENT:     Head: Normocephalic and atraumatic.     Right Ear: Hearing, tympanic membrane, ear canal and external ear normal.     Left Ear: Hearing, tympanic membrane, ear canal and external ear normal.     Nose: Congestion and rhinorrhea present. Rhinorrhea is clear.     Right Sinus: No maxillary sinus tenderness or frontal sinus tenderness.     Left Sinus: No maxillary sinus tenderness or frontal sinus tenderness.     Mouth/Throat:      Lips: Pink.     Mouth: Mucous membranes are moist.     Pharynx: Uvula midline. No oropharyngeal exudate or posterior oropharyngeal erythema.     Tonsils: No tonsillar exudate.  Eyes:     Conjunctiva/sclera: Conjunctivae normal.     Pupils: Pupils are equal, round, and reactive to light.  Cardiovascular:     Rate and Rhythm: Normal rate and regular rhythm.     Heart sounds: S1 normal and S2 normal. No murmur heard. Pulmonary:     Effort: Pulmonary effort is normal. No respiratory distress.     Breath sounds: Examination of the right-upper field reveals decreased breath sounds. Examination of the left-upper field reveals decreased breath sounds. Examination of the right-middle field reveals decreased breath sounds. Examination of the left-middle field reveals decreased breath sounds. Examination of the right-lower field reveals decreased breath sounds. Examination of the left-lower field reveals decreased breath sounds. Decreased breath sounds present. No wheezing, rhonchi or rales.  Abdominal:     General: Bowel sounds are normal.     Palpations: Abdomen is soft.     Tenderness: There is no abdominal tenderness.  Musculoskeletal:        General: No swelling.     Cervical back: Neck supple.  Lymphadenopathy:     Head:     Right side of head: No submental, submandibular, tonsillar, preauricular or posterior auricular adenopathy.     Left side of head: No submental, submandibular, tonsillar, preauricular or posterior auricular adenopathy.     Cervical: No cervical adenopathy.     Right cervical: No superficial cervical adenopathy.    Left cervical: No superficial cervical adenopathy.  Skin:    General: Skin is warm and dry.     Capillary Refill: Capillary refill takes less than 2 seconds.     Findings: No rash.  Neurological:     Mental Status: She is alert and oriented to person, place, and time.  Psychiatric:        Mood and Affect: Mood normal.   Okay  UC Treatments / Results   Labs (all labs ordered are listed, but only abnormal results are displayed) Labs Reviewed  POC COVID19/FLU A&B COMBO    EKG   Radiology No results found.  Procedures Procedures (including critical care time)  Medications Ordered in UC Medications - No data to display  Initial Impression / Assessment and Plan / UC Course  I have reviewed the triage vital signs and the nursing notes.  Pertinent labs & imaging results that were available during my care of the patient were reviewed by me and considered in my medical decision making (see chart for details).     Chest x-ray appears negative.  Will update patient if radiology review differs.  Exam and history are most consistent with a viral upper respiratory infection.  Encouraged wellness formula, 2 pills, twice daily to help fight off this viral infection.  Promethazine DM, 5 mL, every 6 hours as needed for cough.  Get plenty of fluids and rest.  The patient is concerned about exposing family members and other viruses still contagious.  Encouraged to segregated from her family for 3 to 5 days until she is feeling better.  Follow-up here if symptoms do not improve, worsen or new symptoms occur. Final Clinical Impressions(s) / UC Diagnoses   Final diagnoses:  Acute cough  Viral URI with cough     Discharge Instructions      Exam and history are consistent with a viral upper respiratory infection.  Chest x-ray is clear.  Will contact patient if radiology review differs.  Encouraged use of wellness formula, 2 pills, twice daily.  Provided Promethazine DM, 5 mL, every 6 hours, as needed for cough.  Get plenty of fluids and rest.  I would advise you to stay away from people who are at risk for at least 3 to 5 days.     ED Prescriptions     Medication Sig Dispense Auth. Provider   promethazine-dextromethorphan (PROMETHAZINE-DM) 6.25-15 MG/5ML syrup Take 5 mLs by mouth 4 (four) times daily as needed for cough. Do not use and  drive - May make drowsy. 118 mL Prescilla Sours, FNP      PDMP not reviewed this encounter.   Prescilla Sours, FNP 12/10/23 1410

## 2023-12-24 ENCOUNTER — Ambulatory Visit: Payer: Medicare HMO

## 2024-01-08 ENCOUNTER — Other Ambulatory Visit (HOSPITAL_BASED_OUTPATIENT_CLINIC_OR_DEPARTMENT_OTHER): Payer: Self-pay

## 2024-01-08 ENCOUNTER — Other Ambulatory Visit: Payer: Self-pay | Admitting: Orthopedic Surgery

## 2024-01-08 ENCOUNTER — Other Ambulatory Visit (HOSPITAL_COMMUNITY): Payer: Self-pay

## 2024-01-08 DIAGNOSIS — Z96611 Presence of right artificial shoulder joint: Secondary | ICD-10-CM

## 2024-01-11 ENCOUNTER — Ambulatory Visit
Admission: RE | Admit: 2024-01-11 | Discharge: 2024-01-11 | Disposition: A | Discharge status: Home / Self Care | Source: Ambulatory Visit | Attending: Orthopedic Surgery | Admitting: Orthopedic Surgery

## 2024-01-11 DIAGNOSIS — Z96611 Presence of right artificial shoulder joint: Secondary | ICD-10-CM

## 2024-01-14 ENCOUNTER — Ambulatory Visit (HOSPITAL_COMMUNITY): Admit: 2024-01-14 | Payer: Medicare HMO | Admitting: Orthopedic Surgery

## 2024-01-14 SURGERY — TOTAL KNEE ARTHROPLASTY
Anesthesia: Choice | Site: Knee | Laterality: Right

## 2024-01-18 ENCOUNTER — Telehealth: Payer: Self-pay | Admitting: Oncology

## 2024-01-18 DIAGNOSIS — E039 Hypothyroidism, unspecified: Secondary | ICD-10-CM

## 2024-01-18 DIAGNOSIS — C562 Malignant neoplasm of left ovary: Secondary | ICD-10-CM

## 2024-01-18 NOTE — Telephone Encounter (Signed)
 Called [name] back and let her know we have order the thyroid labs to be drawn on 01/22/24.

## 2024-01-18 NOTE — Telephone Encounter (Signed)
 Please place orders

## 2024-01-18 NOTE — Telephone Encounter (Signed)
 [name] called and asked if Dr. Bertis Ruddy could order thyroid labs (TSH, T3, T4) along with her other labs on 01/22/24.  She has not been felling well with no energy and wanting to sleep all the time.  She is worried her thyroid function is off again.  She knows her PCP will need to manage the results.

## 2024-01-21 NOTE — Assessment & Plan Note (Signed)
 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

## 2024-01-22 ENCOUNTER — Inpatient Hospital Stay: Payer: Medicare HMO | Attending: Hematology and Oncology

## 2024-01-22 ENCOUNTER — Inpatient Hospital Stay: Payer: Medicare HMO | Admitting: Hematology and Oncology

## 2024-01-22 ENCOUNTER — Encounter: Payer: Self-pay | Admitting: Hematology and Oncology

## 2024-01-22 VITALS — BP 161/73 | HR 71 | Temp 97.8°F | Resp 18 | Ht 65.0 in | Wt 170.4 lb

## 2024-01-22 DIAGNOSIS — C562 Malignant neoplasm of left ovary: Secondary | ICD-10-CM

## 2024-01-22 DIAGNOSIS — Z9221 Personal history of antineoplastic chemotherapy: Secondary | ICD-10-CM | POA: Insufficient documentation

## 2024-01-22 DIAGNOSIS — R5381 Other malaise: Secondary | ICD-10-CM | POA: Diagnosis not present

## 2024-01-22 DIAGNOSIS — Z8543 Personal history of malignant neoplasm of ovary: Secondary | ICD-10-CM | POA: Insufficient documentation

## 2024-01-22 DIAGNOSIS — Z79899 Other long term (current) drug therapy: Secondary | ICD-10-CM | POA: Insufficient documentation

## 2024-01-22 DIAGNOSIS — E039 Hypothyroidism, unspecified: Secondary | ICD-10-CM

## 2024-01-22 LAB — COMPREHENSIVE METABOLIC PANEL WITH GFR
ALT: 18 U/L (ref 0–44)
AST: 21 U/L (ref 15–41)
Albumin: 4.2 g/dL (ref 3.5–5.0)
Alkaline Phosphatase: 64 U/L (ref 38–126)
Anion gap: 5 (ref 5–15)
BUN: 22 mg/dL (ref 8–23)
CO2: 25 mmol/L (ref 22–32)
Calcium: 9.8 mg/dL (ref 8.9–10.3)
Chloride: 109 mmol/L (ref 98–111)
Creatinine, Ser: 0.75 mg/dL (ref 0.44–1.00)
GFR, Estimated: >60 mL/min (ref >60)
Glucose, Bld: 141 mg/dL — ABNORMAL HIGH (ref 70–99)
Potassium: 5 mmol/L (ref 3.5–5.1)
Sodium: 139 mmol/L (ref 135–145)
Total Bilirubin: 0.5 mg/dL (ref 0.0–1.2)
Total Protein: 6.9 g/dL (ref 6.5–8.1)

## 2024-01-22 LAB — CBC WITH DIFFERENTIAL/PLATELET
Abs Immature Granulocytes: 0.08 10*3/uL — ABNORMAL HIGH (ref 0.00–0.07)
Basophils Absolute: 0.1 10*3/uL (ref 0.0–0.1)
Basophils Relative: 2 %
Eosinophils Absolute: 0.2 10*3/uL (ref 0.0–0.5)
Eosinophils Relative: 3 %
HCT: 41.3 % (ref 36.0–46.0)
Hemoglobin: 13.6 g/dL (ref 12.0–15.0)
Immature Granulocytes: 1 %
Lymphocytes Relative: 30 %
Lymphs Abs: 2.3 10*3/uL (ref 0.7–4.0)
MCH: 31.6 pg (ref 26.0–34.0)
MCHC: 32.9 g/dL (ref 30.0–36.0)
MCV: 95.8 fL (ref 80.0–100.0)
Monocytes Absolute: 0.5 10*3/uL (ref 0.1–1.0)
Monocytes Relative: 6 %
Neutro Abs: 4.4 10*3/uL (ref 1.7–7.7)
Neutrophils Relative %: 58 %
Platelets: 291 10*3/uL (ref 150–400)
RBC: 4.31 MIL/uL (ref 3.87–5.11)
RDW: 14.4 % (ref 11.5–15.5)
WBC: 7.6 10*3/uL (ref 4.0–10.5)
nRBC: 0 % (ref 0.0–0.2)

## 2024-01-22 LAB — TSH: TSH: 3.254 u[IU]/mL (ref 0.350–4.500)

## 2024-01-22 NOTE — Assessment & Plan Note (Addendum)
 She had shoulder injury status post surgery and has been quite immobile since I reviewed recent shoulder imaging I recommend she discuss with her orthopedic surgeon to see if she can start physical therapy

## 2024-01-22 NOTE — Progress Notes (Signed)
 [facility] OFFICE PROGRESS NOTE  Patient Care Team: Gordan Payment., MD as PCP - General (Internal Medicine)  Assessment & Plan [name]) She was diagnosed with left ovarian cancer in 2019 status post surgery, clinical stage II disease and received adjuvant chemotherapy with 6 cycles of carboplatin and paclitaxel, completed by September 2019 Pathology: High-grade serous, Neg BRCA test on tumor but positive for RAD51C  She was placed on rucaparib from 2019 to December 2023  Serial imaging study showed no evidence of disease I have discontinued surveillance imaging I will see her every 6 months with tumor marker monitoring only Physical debility She had shoulder injury status post surgery and has been quite immobile since I reviewed recent shoulder imaging I recommend she discuss with her orthopedic surgeon to see if she can start physical therapy  No orders of the defined types were placed in this encounter.    Artis Delay, MD  INTERVAL HISTORY: she returns for surveillance follow-up She denies signs or symptoms of cancer recurrence in her abdomen Denies abdominal pain or bloating She continues to have chronic constipation and relies on laxatives for bowel movement  PHYSICAL EXAMINATION: ECOG PERFORMANCE STATUS: 0 - Asymptomatic  Vitals:   01/22/24 0929  BP: (!) 161/73  Pulse: 71  Resp: 18  Temp: 97.8 F (36.6 C)  SpO2: 99%   Filed Weights   01/22/24 0929  Weight: 170 lb 6.4 oz (77.3 kg)    Relevant data reviewed during this visit included CBC and CMP

## 2024-01-23 ENCOUNTER — Telehealth: Payer: Self-pay | Admitting: Oncology

## 2024-01-23 LAB — T4: T4, Total: 9.3 ug/dL (ref 4.5–12.0)

## 2024-01-23 LAB — T3: T3, Total: 109 ng/dL (ref 71–180)

## 2024-01-23 LAB — CA 125: Cancer Antigen (CA) 125: 18.6 U/mL (ref 0.0–38.1)

## 2024-01-23 NOTE — Telephone Encounter (Signed)
 Called Malayah and let her know her lab results are back.  She was able to see them on Mychart.  Asked if we need to send her thyroid labs to her PCP and she said that he will be able to see them on CareEverywhere.

## 2024-01-28 ENCOUNTER — Other Ambulatory Visit: Payer: Self-pay

## 2024-01-28 ENCOUNTER — Other Ambulatory Visit (HOSPITAL_COMMUNITY): Payer: Self-pay

## 2024-01-28 MED ORDER — HYDROCHLOROTHIAZIDE 25 MG PO TABS
25.0000 mg | ORAL_TABLET | Freq: Every day | ORAL | 3 refills | Status: DC
  Filled 2024-01-28: qty 90, 90d supply, fill #0

## 2024-01-29 ENCOUNTER — Other Ambulatory Visit (HOSPITAL_COMMUNITY): Payer: Self-pay

## 2024-01-31 DIAGNOSIS — M25611 Stiffness of right shoulder, not elsewhere classified: Secondary | ICD-10-CM

## 2024-01-31 HISTORY — DX: Stiffness of right shoulder, not elsewhere classified: M25.611

## 2024-02-13 ENCOUNTER — Other Ambulatory Visit (HOSPITAL_COMMUNITY): Payer: Self-pay

## 2024-02-13 ENCOUNTER — Other Ambulatory Visit: Payer: Self-pay

## 2024-02-13 DIAGNOSIS — R42 Dizziness and giddiness: Secondary | ICD-10-CM | POA: Insufficient documentation

## 2024-02-13 HISTORY — DX: Dizziness and giddiness: R42

## 2024-02-13 MED ORDER — MECLIZINE HCL 25 MG PO TABS
25.0000 mg | ORAL_TABLET | Freq: Three times a day (TID) | ORAL | 3 refills | Status: DC | PRN
  Filled 2024-02-13: qty 180, 60d supply, fill #0

## 2024-02-13 MED ORDER — LEVOTHYROXINE SODIUM 100 MCG PO TABS
100.0000 ug | ORAL_TABLET | Freq: Every day | ORAL | 3 refills | Status: DC
Start: 2024-02-13 — End: 2024-03-21
  Filled 2024-02-13: qty 90, 90d supply, fill #0

## 2024-02-14 ENCOUNTER — Other Ambulatory Visit (HOSPITAL_COMMUNITY): Payer: Self-pay

## 2024-02-20 DIAGNOSIS — R42 Dizziness and giddiness: Secondary | ICD-10-CM | POA: Insufficient documentation

## 2024-02-20 DIAGNOSIS — I951 Orthostatic hypotension: Secondary | ICD-10-CM | POA: Insufficient documentation

## 2024-02-20 HISTORY — DX: Orthostatic hypotension: I95.1

## 2024-02-20 HISTORY — DX: Dizziness and giddiness: R42

## 2024-03-21 ENCOUNTER — Encounter: Payer: Self-pay | Admitting: Cardiology

## 2024-03-21 ENCOUNTER — Ambulatory Visit: Attending: Cardiology | Admitting: Cardiology

## 2024-03-21 VITALS — BP 110/68 | HR 56 | Ht 64.5 in | Wt 170.0 lb

## 2024-03-21 DIAGNOSIS — I10 Essential (primary) hypertension: Secondary | ICD-10-CM | POA: Diagnosis not present

## 2024-03-21 DIAGNOSIS — I2510 Atherosclerotic heart disease of native coronary artery without angina pectoris: Secondary | ICD-10-CM | POA: Diagnosis not present

## 2024-03-21 DIAGNOSIS — I951 Orthostatic hypotension: Secondary | ICD-10-CM | POA: Diagnosis not present

## 2024-03-21 MED ORDER — HYDROCHLOROTHIAZIDE 25 MG PO TABS: 12.5000 mg | ORAL_TABLET | Freq: Every day | ORAL | Status: DC

## 2024-03-21 NOTE — Progress Notes (Signed)
 Cardiology Office Note:    Date:  03/21/2024   ID:  [name], DOB [birth year], MRN [id number]  PCP:  Abbe Hoard., MD  Cardiologist:  Ralene Burger, MD    Referring MD: Abbe Hoard., MD   Chief Complaint  Patient presents with   Dizziness    History of Present Illness:    [name] is a 83 y.o. female past medical history significant for calcification of the coronary arteries noted on the CT, after that stress test being done which was negative, additional problem include essential hypertension, dyslipidemia.  Comes today 2 months for follow-up.  Cardiac wise doing well.  Denies have any chest pain tightness squeezing pressure burning chest.  She does have some dizziness but clearly orthostatic she states when she gets up very quickly she gets dizzy.  Initially she thought that was vertigo like she used to have she went to the person that previously fix her vertigo however she told her that this is not vertigo and I agree with this.  Nothing suggesting this is arrhythmia.  Past Medical History:  Diagnosis Date   Acquired hypothyroidism 12/13/2015   Last Assessment & Plan:   Relevant Hx:  Course:  Daily Update:  Today's Plan:update her TSH for her on her current dose of med     Electronically signed by: Despina Floro, NP  01/11/16 [phone]   Acute diverticulitis 01/06/2022   Anemia due to antineoplastic chemotherapy 08/19/2019   Arthritis    Atherosclerosis 12/13/2015   Axillary lymphadenopathy 11/04/2018   Bilateral thumb pain 12/04/2022   Breast pain, right 11/04/2018   Bronchiectasis without complication (HCC) 07/08/2019   Bruising 09/22/2020   Change in nail appearance 02/03/2019   Formatting of this note might be different from the original. Recommend review with Onc and then Derm if not clear. Formatting of this note might be different from the original. Recommend review with Onc and then Derm if not clear.   Chemotherapy adverse reaction 03/11/2018    Chronic back pain greater than 3 months duration 09/24/2018   Chronic idiopathic constipation 03/13/2018   Chronic left shoulder pain 01/11/2021   Coronary artery calcification 11/28/2018   Formatting of this note might be different from the original. Seen on CT of chest 11/2018 Formatting of this note might be different from the original. 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) 12/13/2015   Last Assessment & Plan:   Relevant Hx:  Course:  Daily Update:  Today's Plan:we had a long discussion about med choices, she had been given onglyza samples when I saw her briefly in the lobby about 2 weeks ago to hold till she came back and did her labs, we discussed med choices with victoza  and byetta  which worked better for her though she could not afford them, discussed the newer meds farxiga a   Diabetes mellitus without complication (HCC)    type 2    Elevated liver function tests 02/06/2018   Essential hypertension 12/13/2015   Last Assessment & Plan:   Relevant Hx:  Course:  Daily Update:  Today's Plan:this is stable for her and will continue to follow this for her     Electronically signed by: Despina Floro, NP  01/11/16 [phone]   Genetic testing 06/03/2018   TumorNext HRD + CancerNext germline testing was ordered.     The CancerNext gene panel offered by 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    Intractable pain    Left lower quadrant abdominal pain 01/02/2022   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   Lumbar disc herniation 02/09/2021   Lumbosacral radiculopathy    Major depressive disorder, 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/13/2015   Malaise and fatigue 12/13/2015   Last Assessment & Plan:   Relevant Hx:  Course:  Daily Update:  Today's Plan:she is walking about 4-5 days per week and going to the gym and has gotten up to speed 4 on a level 3 , she feels her energy drain though is more sugar related and that is bothersome to her     Electronically signed by: Despina Floro, NP  01/11/16 0913   Malignant neoplasm of left ovary (HCC)    Mixed hyperlipidemia 12/13/2015   Last Assessment & Plan:   Relevant Hx:  Course:  Daily Update:  Today's Plan:update her lipids for her today      Electronically signed by: Despina Floro, NP  01/11/16 [phone]   Multiple lung nodules on CT 07/25/2018   Mycobacteria, 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 Probably related to to crestor .  Avoid statins. Formatting of this note might be different from the original. Probably related to to crestor .  Avoid statins.   Osteoarthritis of knee 12/04/2022   Other constipation 03/01/2018   Other fatigue 11/11/2018   Ovarian cancer (HCC) 01/31/2018   Pancytopenia, acquired (HCC) 10/25/2018   Physical debility 07/25/2018   Pneumonia    its been 4 years    PONV (postoperative nausea and vomiting)    Post-menopause 02/21/2016   Formatting of this note might be different from the original. Nl dexa  07/2020. Repeat 2025. Formatting of this note might be different from the original. Nl dexa 07/2020. Repeat 2025.   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 Seen on most recent CT scan done by oncology 11/2018 with enlarged pulmonary artery Formatting of this note might be different from the original. 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   Urticaria    UTI (urinary tract infection) 05/13/2019   Vitamin B 12 deficiency    Vitamin B12 deficiency 12/13/2015   Weight loss 04/22/2020    Past Surgical History:  Procedure Laterality Date   BLADDER SUSPENSION  2002  BREAST SURGERY     several benign cysts removed; surgeries from 1975- 1990    CHOLECYSTECTOMY  1988   DECOMPRESSIVE LUMBAR LAMINECTOMY LEVEL 1 N/A 02/09/2021   Procedure: DECOMPRESSIVE LUMBAR LAMINECTOMY LEVEL 1 lumbar three-four;  Surgeon: Mort Ards, MD;  Location: [facility] OR;  Service: Orthopedics;  Laterality: N/A;   EYE SURGERY  2010 amd 2012   cataracts removed bilateral    IR FLUORO GUIDE PORT INSERTION RIGHT  02/28/2018   IR REMOVAL TUN ACCESS W/ PORT W/O FL MOD SED  03/28/2022   IR US  GUIDE VASC ACCESS RIGHT  02/28/2018   KNEE ARTHROSCOPY Left    l knee x2   KYPHOPLASTY N/A 02/09/2021   Procedure: KYPHOPLASTY lumbar four;  Surgeon: Mort Ards, MD;  Location: [facility] OR;  Service: Orthopedics;  Laterality: N/A;   PARATHYROIDECTOMY  2010   REVERSE SHOULDER ARTHROPLASTY Right 04/26/2023   Procedure: REVERSE SHOULDER ARTHROPLASTY;  Surgeon: Ellard Gunning, MD;  Location: [facility] ORS;  Service: Orthopedics;  Laterality: Right;    ROBOTIC ASSISTED SALPINGO OOPHERECTOMY Bilateral 01/31/2018    Procedure: XI ROBOTIC ASSISTED BILATERAL SALPINGO OOPHORECTOMY, STAGING, LAPAROTOMY, PELVIC AND PARA AORTIC LYMPH NODE DISSECTION, OMENTECTOMY;  Surgeon: Lyn Sanders, MD;  Location: [facility] ORS;  Service: Gynecology;  Laterality: Bilateral;   ROTATOR CUFF REPAIR  2005   right    TOTAL KNEE ARTHROPLASTY Left 12/08/2014   Procedure: LEFT TOTAL KNEE ARTHROPLASTY;  Surgeon: Florencia Hunter, MD;  Location: [facility] ORS;  Service: Orthopedics;  Laterality: Left;   VAGINAL HYSTERECTOMY  1984    Current Medications: Current Meds  Medication Sig   ALPRAZolam  (XANAX ) 0.5 MG tablet Take 1 tablet (0.5 mg total) by mouth every 12 (twelve) hours as needed. Medication refills require office visit every 6 months. (Patient taking differently: Take 0.5 mg by mouth every 12 (twelve) hours as needed for anxiety or sleep. Medication refills require office visit every 6 months.)   amLODipine  (NORVASC ) 5 MG tablet Take 1 tablet (5 mg total) by mouth daily.   atorvastatin  (LIPITOR) 10 MG tablet Take 1 tablet (10 mg total) by mouth daily.   Cyanocobalamin (VITAMIN B-12 IJ) Inject 1 Dose as directed every 30 (thirty) days.   FLUoxetine  (PROZAC ) 20 MG capsule Take 1 capsule (20 mg total) by mouth daily.   glimepiride  (AMARYL ) 1 MG tablet Take 1 tablet (1 mg total) by mouth every morning before breakfast. Take 1\2 tablet prn after steroid injection. (Patient taking differently: Take 0.5 mg by mouth daily as needed (if on prednisone ).)   glucose blood (ACCU-CHEK GUIDE TEST) test strip Use as instructed (Patient taking differently: 1 each by Other route as needed for other.)   hydrochlorothiazide  (HYDRODIURIL ) 25 MG tablet Take 1 tablet (25 mg total) by mouth daily.   levothyroxine  (SYNTHROID ) 88 MCG tablet Take 88 mcg by mouth daily before breakfast.   LINZESS  145 MCG CAPS capsule Take 1 capsule (145 mcg total) by mouth daily.   losartan  (COZAAR ) 100 MG tablet Take 1 tablet (100 mg total) by mouth daily.   Magnesium  400 MG TABS  Take 400 mg by mouth daily.   meclizine  (ANTIVERT ) 25 MG tablet Take 1 tablet (25 mg total) by mouth 3 (three) times daily as needed for dizziness. (Patient taking differently: Take 25 mg by mouth 3 (three) times daily as needed for dizziness or nausea.)   Melatonin 10 MG TABS Take 10 mg by mouth at bedtime.   metFORMIN  (GLUCOPHAGE -XR) 500 MG 24 hr tablet Take 1 tablet (500  mg total) by mouth in the morning and at bedtime.   [DISCONTINUED] ALPRAZolam  (XANAX ) 0.5 MG tablet Take 0.5 mg by mouth at bedtime.   [DISCONTINUED] amLODipine  (NORVASC ) 5 MG tablet Take 5 mg by mouth daily.   [DISCONTINUED] atorvastatin  (LIPITOR) 10 MG tablet Take 10 mg by mouth daily.   [DISCONTINUED] Blood Glucose Monitoring Suppl (ONE TOUCH ULTRA 2) w/Device KIT Use as directed (Patient taking differently: 1 each by Other route as directed. Use as directed)   [DISCONTINUED] FLUoxetine  (PROZAC ) 20 MG capsule Take 20 mg by mouth daily after breakfast.   [DISCONTINUED] hydrochlorothiazide  (HYDRODIURIL ) 25 MG tablet Take 25 mg by mouth daily.   [DISCONTINUED] levothyroxine  (SYNTHROID ) 100 MCG tablet Take 1 tablet (100 mcg total) by mouth daily.   [DISCONTINUED] linaclotide  (LINZESS ) 145 MCG CAPS capsule Take 145 mcg by mouth daily before breakfast.   [DISCONTINUED] losartan  (COZAAR ) 100 MG tablet Take 100 mg by mouth at bedtime.    [DISCONTINUED] metFORMIN  (GLUCOPHAGE -XR) 500 MG 24 hr tablet Take 500 mg by mouth 2 (two) times daily.   [DISCONTINUED] methocarbamol  (ROBAXIN ) 500 MG tablet Take 1 tablet (500 mg total) by mouth every 8 (eight) hours as needed for muscle spasms.   [DISCONTINUED] montelukast  (SINGULAIR ) 10 MG tablet Take 1 tablet (10 mg total) by mouth at bedtime. (Patient taking differently: Take 10 mg by mouth at bedtime as needed (allergies).)   [DISCONTINUED] promethazine -dextromethorphan (PROMETHAZINE -DM) 6.25-15 MG/5ML syrup Take 5 mLs by mouth 4 (four) times daily as needed for cough. Do not use and drive - May  make drowsy.     Allergies:   Penicillins and Rosuvastatin    Social History   Socioeconomic History   Marital status: Widowed    Spouse name: Not on file   Number of children: 2   Years of education: Not on file   Highest education level: Not on file  Occupational History   Occupation: retired Print production planner  Tobacco Use   Smoking status: Never   Smokeless tobacco: Never  Vaping Use   Vaping status: Never Used  Substance and Sexual Activity   Alcohol  use: No   Drug use: No   Sexual activity: Not on file  Other Topics Concern   Not on file  Social History Narrative   Not on file   Social Drivers of Health   Financial Resource Strain: Not on file  Food Insecurity: Not on file  Transportation Needs: Not on file  Physical Activity: Not on file  Stress: Not on file  Social Connections: Not on file     Family History: The patient's family history includes Bladder Cancer (age of onset: 37) in her sister; Breast cancer (age of onset: 29) in an other family member; Esophageal cancer in her maternal uncle; Leukemia in her brother and cousin; Lung cancer in her cousin, cousin, and maternal aunt; Lymphoma (age of onset: 50) in her mother; Other (age of onset: 76) in her maternal grandfather; Prostate cancer (age of onset: 76) in her brother; Prostate cancer (age of onset: 33) in her father. There is no history of Allergic rhinitis, Angioedema, Asthma, Atopy, Eczema, Immunodeficiency, or Urticaria. ROS:   Please see the history of present illness.    All 14 point review of systems negative except as described per history of present illness  EKGs/Labs/Other Studies Reviewed:    EKG Interpretation Date/Time:  Friday March 21 2024 09:53:12 EDT Ventricular Rate:  56 PR Interval:  162 QRS Duration:  80 QT Interval:  450 QTC Calculation: 434  R Axis:   12  Text Interpretation: Sinus bradycardia Low voltage QRS Borderline ECG When compared with ECG of 19-Nov-2021 15:09, PREVIOUS ECG  IS PRESENT Confirmed by Ralene Burger [phone]) on 03/21/2024 10:07:33 AM    Recent Labs: 01/22/2024: ALT 18; BUN 22; Creatinine, Ser 0.75; Hemoglobin 13.6; Platelets 291; Potassium 5.0; Sodium 139; TSH 3.254  Recent Lipid Panel No results found for: "CHOL", "TRIG", "HDL", "CHOLHDL", "VLDL", "LDLCALC", "LDLDIRECT"  Physical Exam:    VS:  BP 110/68 (BP Location: Right Arm, Patient Position: Sitting)   Pulse (!) 56   Ht 5' 4.5" (1.638 m)   Wt 170 lb (77.1 kg)   SpO2 94%   BMI 28.73 kg/m     Wt Readings from Last 3 Encounters:  03/21/24 170 lb (77.1 kg)  01/22/24 170 lb 6.4 oz (77.3 kg)  07/24/23 165 lb 6.4 oz (75 kg)     GEN:  Well nourished, well developed in no acute distress HEENT: Normal NECK: No JVD; No carotid bruits LYMPHATICS: No lymphadenopathy CARDIAC: RRR, no murmurs, no rubs, no gallops RESPIRATORY:  Clear to auscultation without rales, wheezing or rhonchi  ABDOMEN: Soft, non-tender, non-distended MUSCULOSKELETAL:  No edema; No deformity  SKIN: Warm and dry LOWER EXTREMITIES: no swelling NEUROLOGIC:  Alert and oriented x 3 PSYCHIATRIC:  Normal affect   ASSESSMENT:    1. Essential hypertension   2. Coronary artery calcification   3. Orthostatic hypotension    PLAN:    In order of problems listed above:  Orthostatic hypotension.  Will cut down her diuretic her blood pressure is soft.  Ask her to make sure she stay well-hydrated especially if she goes outside working in the garden which she lives. Coronary disease with calcification but stress test negative denies have any signs and symptoms that would indicate significant symptomatic disease. Dyslipidemia I do not have her fasting lipid profile will call primary care physician to get a copy of it.   Medication Adjustments/Labs and Tests Ordered: Current medicines are reviewed at length with the patient today.  Concerns regarding medicines are outlined above.  Orders Placed This Encounter  Procedures   EKG  12-Lead   Medication changes: No orders of the defined types were placed in this encounter.   Signed, Manfred Seed, MD, [facility] 03/21/2024 10:21 AM    [facility]

## 2024-03-21 NOTE — Patient Instructions (Addendum)
 Medication Instructions:   DECREASE: hydrochlorothiazide  25mg  1/2 tablet daily   Lab Work: None Ordered If you have labs (blood work) drawn today and your tests are completely normal, you will receive your results only by: MyChart Message (if you have MyChart) OR A paper copy in the mail If you have any lab test that is abnormal or we need to change your treatment, we will call you to review the results.   Testing/Procedures: None Ordered   Follow-Up: At [facility], you and your health needs are our priority.  As part of our continuing mission to provide you with exceptional heart care, we have created designated Provider Care Teams.  These Care Teams include your primary Cardiologist (physician) and Advanced Practice Providers (APPs -  Physician Assistants and Nurse Practitioners) who all work together to provide you with the care you need, when you need it.  We recommend signing up for the patient portal called "MyChart".  Sign up information is provided on this After Visit Summary.  MyChart is used to connect with patients for Virtual Visits (Telemedicine).  Patients are able to view lab/test results, encounter notes, upcoming appointments, etc.  Non-urgent messages can be sent to your provider as well.   To learn more about what you can do with MyChart, go to [email or web address].    Your next appointment:   6 month(s)  The format for your next appointment:   In Person  Provider:   Ralene Burger, MD    Other Instructions NA

## 2024-04-14 ENCOUNTER — Other Ambulatory Visit (HOSPITAL_COMMUNITY): Payer: Self-pay

## 2024-04-14 ENCOUNTER — Other Ambulatory Visit: Payer: Self-pay

## 2024-04-14 MED ORDER — FLUOXETINE HCL 20 MG PO CAPS
20.0000 mg | ORAL_CAPSULE | Freq: Every day | ORAL | 3 refills | Status: AC
  Filled 2024-04-14: qty 90, 90d supply, fill #0
  Filled 2024-07-03 – 2024-07-07 (×2): qty 90, 90d supply, fill #1
  Filled 2024-10-03: qty 90, 90d supply, fill #2

## 2024-04-14 MED ORDER — ATORVASTATIN CALCIUM 10 MG PO TABS
10.0000 mg | ORAL_TABLET | Freq: Every day | ORAL | 3 refills | Status: DC
  Filled 2024-07-23: qty 90, 90d supply, fill #0
  Filled 2024-10-03: qty 90, 90d supply, fill #1

## 2024-04-14 MED ORDER — LEVOTHYROXINE SODIUM 100 MCG PO TABS
100.0000 ug | ORAL_TABLET | Freq: Every day | ORAL | 3 refills | Status: DC
  Filled 2024-04-14 – 2024-04-29 (×2): qty 90, 90d supply, fill #0
  Filled 2024-07-31: qty 90, 90d supply, fill #1

## 2024-04-14 MED ORDER — HYDROCHLOROTHIAZIDE 25 MG PO TABS
25.0000 mg | ORAL_TABLET | Freq: Every day | ORAL | 3 refills | Status: DC
  Filled 2024-07-03: qty 90, 90d supply, fill #0
  Filled 2024-10-03: qty 90, 90d supply, fill #1

## 2024-04-14 MED ORDER — LOSARTAN POTASSIUM 100 MG PO TABS
100.0000 mg | ORAL_TABLET | Freq: Every day | ORAL | 3 refills | Status: DC
  Filled 2024-04-14 – 2024-04-29 (×2): qty 90, 90d supply, fill #0
  Filled 2024-08-14: qty 90, 90d supply, fill #1

## 2024-04-14 MED ORDER — METFORMIN HCL ER 500 MG PO TB24
500.0000 mg | ORAL_TABLET | Freq: Two times a day (BID) | ORAL | 3 refills | Status: DC
  Filled 2024-04-14 – 2024-04-29 (×2): qty 180, 90d supply, fill #0
  Filled 2024-08-14: qty 180, 90d supply, fill #1

## 2024-04-14 MED ORDER — AMLODIPINE BESYLATE 5 MG PO TABS
5.0000 mg | ORAL_TABLET | Freq: Every day | ORAL | 3 refills | Status: DC
  Filled 2024-07-03 (×2): qty 90, 90d supply, fill #0
  Filled 2024-10-03: qty 90, 90d supply, fill #1

## 2024-04-14 MED ORDER — ALPRAZOLAM 0.5 MG PO TABS
0.5000 mg | ORAL_TABLET | Freq: Two times a day (BID) | ORAL | 1 refills | Status: DC | PRN
  Filled 2024-04-29: qty 90, 45d supply, fill #0
  Filled 2024-07-03: qty 90, 45d supply, fill #1

## 2024-04-14 MED ORDER — GLIMEPIRIDE 1 MG PO TABS
ORAL_TABLET | ORAL | 0 refills | Status: AC
  Filled 2024-04-14: qty 45, 30d supply, fill #0

## 2024-04-14 MED ORDER — LINZESS 145 MCG PO CAPS
145.0000 ug | ORAL_CAPSULE | Freq: Every day | ORAL | 0 refills | Status: AC
  Filled 2024-04-14: qty 90, 90d supply, fill #0

## 2024-04-14 NOTE — Progress Notes (Signed)
 [name] is a 83 y.o.female.  Subjective:     This patient is in today to follow-up her multiple different health issues and health problems.  We started first by talking about her last visits where she has been discussing neuropathy and pain.  She tells me the neuropathy started immediately after chemotherapy and has persisted.  She was given some options last time of switching to Cymbalta .  The patient ultimately does not want to do that.  She feels stable with her fluoxetine .  She has chosen not to use gabapentin  as she feels like it causes too many side effects with her and she is putting up with the discomfort.  She was most worried about how this could affect her skin and diabetes to make ulcers and lose her feet.  This should not do that, though she should continue to watch her feet carefully.  This patient controlled her diabetes well.  I am planning to update laboratory studies today.  She is due for [place].  She takes B12 shots but would like to do a lab first.  She remains on thyroid  supplement due for laboratory studies.  She also uses some anxiety medication which she is responsible with.  She continues to take atorvastatin  for hyperlipidemia and known coronary disease.  Fortunately there is no chest discomfort.  She is not short of breath.  She does have a history of bronchiectasis but is largely asymptomatic with this.  Plan:    This patient overall appears stable.  Hypertension appears controlled.  Bronchiectasis is stable.  Coronary calcification has been seen on appropriate medications with no angina.  This is stable.  Neuropathy happened.  We spent time reviewing this.  She does not want to change her medications.  Hypothyroidism appears stable.  I will update a TSH.  Diabetes appears to be stable.  I will update a hemoglobin A1c.  Depression appears stable overall.  She is continuing the fluoxetine .  Hyperlipidemia is on atorvastatin  and stable.  Vitamin B12 deficiency take shots but she  would like to update lab today.  I did order that at her request.  I will make further recommendations after review of her labs.  I will continue to follow her about every 6 months.  Today I have evaluated and managed two or more of this patient's chronic stable/unstable health problems.  Today I have reviewed and managed this patient's medications related to their health problems and/or I have prescribed or renewed prescriptions as necessary for health problems.  _____________________________________  Dizziness got better. Not doing any work.  Neuropathy.  Pain. Using aspirin   Diagnoses this Encounter 1. Essential hypertension   2. Bronchiectasis without complication (HCC)   3. Coronary artery calcification   4. Neuropathy due to chemotherapeutic drug (HCC)   5. Acquired hypothyroidism   6. 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)   7. Mild episode of recurrent major depressive disorder   8. Mixed hyperlipidemia   9. Vitamin B12 deficiency    Chief Complaint  Patient presents with  . Chronic problems and med management    Patient's Medications  New Prescriptions   No medications on file  Previous Medications   BLOOD GLUCOSE CONTROL HIGH,LOW (ACCU-CHEK AVIVA CONTROL SOLN) SOLN    USE AS DIRECTED   DOCUSATE SODIUM  (COLACE) 100 MG CAPSULE    Take 100 mg by mouth 2 (two) times a day.   GLUCOSE BLOOD (ACCU-CHEK GUIDE TEST STRIPS) TEST STRIP    Use as instructed  LANCETS (TRUEPLUS LANCETS) 33 GAUGE MISC    TEST BLOOD SUGAR EVERY DAY   LANCETS 30 GAUGE MISC    Use once a day   MECLIZINE  (ANTIVERT ) 25 MG TABLET    Take half or whole tablet every 12 hours as needed   MECLIZINE  (ANTIVERT ) 25 MG TABLET    Take 1 tablet (25 mg total) by mouth 3 (three) times a day as needed for dizziness.   MELATONIN 10 MG CAP    Take 10 mg by mouth nightly as needed.   OMEPRAZOLE  (PRILOSEC ) 20 MG DR CAPSULE    Take 20 mg by mouth in the morning.   TRIAMCINOLONE  (KENALOG ) 0.1 %  OINTMENT    Apply  topically 4 (four) times a day.  Modified Medications   Modified Medication Previous Medication   ALPRAZOLAM  (XANAX ) 0.5 MG TABLET ALPRAZolam  (XANAX ) 0.5 mg tablet      Take one by mouth every 12 hours prn anxiety.  Medication refills require office visit every 6 months.    Take one by mouth every 12 hours prn anxiety.  Medication refills require office visit every 6 months.   AMLODIPINE  (NORVASC ) 5 MG TABLET amLODIPine  (NORVASC ) 5 mg tablet      Take 1 tablet (5 mg total) by mouth daily.    Take 1 tablet (5 mg total) by mouth daily.   ATORVASTATIN  (LIPITOR) 10 MG TABLET atorvastatin  (LIPITOR) 10 mg tablet      Take 1 tablet (10 mg total) by mouth daily.    Take 1 tablet (10 mg total) by mouth daily.   FLUOXETINE  (PROZAC ) 20 MG CAPSULE FLUoxetine  (PROzac ) 20 mg capsule      Take 1 capsule (20 mg total) by mouth daily.    Take 1 capsule (20 mg total) by mouth daily.   GLIMEPIRIDE  (AMARYL ) 1 MG TABLET glimepiride  (AMARYL ) 1 mg tablet      Take 1 tablet (1 mg total) by mouth every morning before breakfast. Take 1\2 tablet prn after steroid injection.    Take 1 tablet (1 mg total) by mouth every morning before breakfast. Take 1\2 tablet prn after steroid injection.   HYDROCHLOROTHIAZIDE  (HYDRODIURIL ) 25 MG TABLET hydroCHLOROthiazide  (HYDRODIURIL ) 25 mg tablet      Take 1 tablet (25 mg total) by mouth daily.    Take 1 tablet (25 mg total) by mouth daily.   LEVOTHYROXINE  (SYNTHROID ) 100 MCG TABLET levothyroxine  (SYNTHROID ) 100 mcg tablet      Take 1 tablet (100 mcg total) by mouth daily.    Take 1 tablet (100 mcg total) by mouth daily.   LINZESS  145 MCG CAP CAPSULE Linzess  145 mcg cap capsule      Take 1 capsule (145 mcg total) by mouth daily.    Take 1 capsule (145 mcg total) by mouth daily.   LOSARTAN  (COZAAR ) 100 MG TABLET losartan  (COZAAR ) 100 mg tablet      Take 1 tablet (100 mg total) by mouth daily.    Take 1 tablet (100 mg total) by mouth daily.   METFORMIN  (GLUCOPHAGE -XR)  500 MG 24 HR TABLET metFORMIN  (GLUCOPHAGE -XR) 500 mg 24 hr tablet      Take 1 tablet (500 mg total) by mouth 2 (two) times a day.    Take 1 tablet (500 mg total) by mouth 2 (two) times a day.  Discontinued Medications   GABAPENTIN  (NEURONTIN ) 300 MG CAPSULE    Take 300 mg by mouth 2 (two) times a day as needed.   Orders Placed This Encounter  Procedures  . Hemoglobin A1C With Estimated Average Glucose  . Lipid Panel  . CBC without Differential  . Comprehensive Metabolic Panel  . TSH  . Vitamin B12  . CBC without Differential   Physical Exam Constitutional:      General: She is not in acute distress.    Appearance: She is not toxic-appearing or diaphoretic.     Comments: Comfortable, pleasant, appearing in no distress.   HENT:     Head: Normocephalic and atraumatic.     Mouth/Throat:     Mouth: Mucous membranes are moist.     Pharynx: No oropharyngeal exudate.   Eyes:     General: No scleral icterus.       Right eye: No discharge.        Left eye: No discharge.   Neck:     Vascular: No carotid bruit.   Cardiovascular:     Rate and Rhythm: Normal rate and regular rhythm.     Heart sounds: No murmur heard.    No friction rub.  Pulmonary:     Effort: No respiratory distress.     Breath sounds: No stridor. No decreased breath sounds, wheezing, rhonchi or rales.  Abdominal:     General: Bowel sounds are normal.     Palpations: Abdomen is soft. There is no hepatomegaly or mass.     Tenderness: There is no abdominal tenderness. There is no rebound.   Musculoskeletal:     Cervical back: Neck supple.  Lymphadenopathy:     Cervical: No cervical adenopathy.   Skin:    General: Skin is warm and dry.     Coloration: Skin is not pale.     Findings: No rash.   Neurological:     Mental Status: She is alert and oriented to person, place, and time.     Gait: Gait normal.     _____________________ Vitals:   04/14/24 1010  BP: 142/82  Pulse: 58  Resp: 15  Temp: 97.9 F  (36.6 C)  Weight: 77.6 kg (171 lb)  Height: 1.626 m (5' 4)   Body mass index is 29.35 kg/m.  Problem List[1] Medical History[2] Surgical History[3] Family History[4] Social History   Socioeconomic History  . Marital status: Married    Spouse name: Not on file  . Number of children: Not on file  . Years of education: Not on file  . Highest education level: Not on file  Occupational History  . Not on file  Tobacco Use  . Smoking status: Never  . Smokeless tobacco: Never  Substance and Sexual Activity  . Alcohol  use: No  . Drug use: No  . Sexual activity: Not on file  Other Topics Concern  . Not on file  Social History Narrative  . Not on file   Social Drivers of Health   Food Insecurity: Low Risk  (04/14/2024)   Food vital sign   . Within the past 12 months, you worried that your food would run out before you got money to buy more: Never true   . Within the past 12 months, the food you bought just didn't last and you didn't have money to get more: Never true  Transportation Needs: No Transportation Needs (04/14/2024)   Transportation   . In the past 12 months, has lack of reliable transportation kept you from medical appointments, meetings, work or from getting things needed for daily living? : No  Safety: Low Risk  (04/14/2024)   Safety   . How  often does anyone, including family and friends, physically hurt you?: Never   . How often does anyone, including family and friends, insult or talk down to you?: Never   . How often does anyone, including family and friends, threaten you with harm?: Never   . How often does anyone, including family and friends, scream or curse at you?: Never  Living Situation: Low Risk  (04/14/2024)   Living Situation   . What is your living situation today?: I have a steady place to live   . Think about the place you live. Do you have problems with any of the following? Choose all that apply:: None/None on this list   Allergies[5]  Review  of Systems  Constitutional:  Negative for chills and fever.  HENT:  Negative for congestion.   Respiratory:  Negative for cough, shortness of breath and wheezing.   Cardiovascular:  Negative for chest pain and palpitations.  Gastrointestinal:  Negative for abdominal pain, constipation, diarrhea and nausea.  Genitourinary:  Negative for difficulty urinating and dysuria.    Some components of the Review of Systems, Social History, Past Medical History, Family History, and Vital Signs were taken from the patient and documented by the medical assistant assisting the provider today.  Electronically signed by: Tobias Canda Redo, CMA 04/14/2024 10:11 AM      [1] Patient Active Problem List Diagnosis  . Atherosclerosis  . 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)  . GERD without esophagitis  . Mixed hyperlipidemia  . Essential hypertension  . Acquired hypothyroidism  . Primary insomnia  . Major depressive disorder, recurrent (HCC)  . Primary osteoarthritis involving multiple joints  . Posterior tibial tendonitis  . Vitamin B12 deficiency  . History of total knee replacement  . Post-menopause  . Malignant neoplasm of left ovary (HCC)  . Chronic idiopathic constipation  . Diffuse pain  . Chemotherapy adverse reaction  . Genetic testing  . Multiple lung nodules on CT  . Neuropathy due to chemotherapeutic drug (HCC)  . Coronary artery calcification  . Change in nail appearance  . Mycobacteria, atypical  . Bronchiectasis without complication (HCC)  . Oral lichen planus  . Lichen simplex chronicus  . Hypomagnesemia  . Senile purpura  . Pes planus of right foot  . Chronic vertigo  [2] No past medical history on file. [3] Past Surgical History: Procedure Laterality Date  . OTHER SURGICAL HISTORY     Procedure: OTHER SURGICAL HISTORY (complete colonoscopy); [place]. 2011. Polyps. Repeat 2016.  [4] Family History Problem Relation Name Age of Onset   . Diabetes Father    . Hypertension Father    . Diabetes Brother    . Diabetes Daughter    [5] Allergies Allergen Reactions  . Rosuvastatin  Itching    Possible.  SABRA Penicillin V Swelling and Rash

## 2024-04-15 ENCOUNTER — Encounter (HOSPITAL_BASED_OUTPATIENT_CLINIC_OR_DEPARTMENT_OTHER): Payer: Self-pay

## 2024-04-15 ENCOUNTER — Emergency Department (HOSPITAL_BASED_OUTPATIENT_CLINIC_OR_DEPARTMENT_OTHER)
Admission: EM | Admit: 2024-04-15 | Discharge: 2024-04-15 | Disposition: A | Discharge status: Home / Self Care | Attending: Emergency Medicine | Admitting: Emergency Medicine

## 2024-04-15 ENCOUNTER — Emergency Department (HOSPITAL_BASED_OUTPATIENT_CLINIC_OR_DEPARTMENT_OTHER)

## 2024-04-15 ENCOUNTER — Other Ambulatory Visit: Payer: Self-pay

## 2024-04-15 DIAGNOSIS — Y92512 Supermarket, store or market as the place of occurrence of the external cause: Secondary | ICD-10-CM | POA: Diagnosis not present

## 2024-04-15 DIAGNOSIS — X501XXA Overexertion from prolonged static or awkward postures, initial encounter: Secondary | ICD-10-CM | POA: Diagnosis not present

## 2024-04-15 DIAGNOSIS — Z8543 Personal history of malignant neoplasm of ovary: Secondary | ICD-10-CM | POA: Diagnosis not present

## 2024-04-15 DIAGNOSIS — M25552 Pain in left hip: Secondary | ICD-10-CM | POA: Insufficient documentation

## 2024-04-15 MED ORDER — HYDROCODONE-ACETAMINOPHEN 5-325 MG PO TABS
1.0000 | ORAL_TABLET | Freq: Once | ORAL | Status: AC
  Administered 2024-04-15: 1 via ORAL
  Filled 2024-04-15: qty 1

## 2024-04-15 MED ORDER — LIDOCAINE 5 % EX PTCH: 1.0000 | MEDICATED_PATCH | CUTANEOUS | 0 refills | Status: DC

## 2024-04-15 MED ORDER — LIDOCAINE 5 % EX PTCH
1.0000 | MEDICATED_PATCH | CUTANEOUS | Status: DC
  Administered 2024-04-15: 1 via TRANSDERMAL
  Filled 2024-04-15: qty 1

## 2024-04-15 MED ORDER — HYDROCODONE-ACETAMINOPHEN 5-325 MG PO TABS: 1.0000 | ORAL_TABLET | ORAL | 0 refills | Status: DC | PRN

## 2024-04-15 NOTE — ED Provider Notes (Signed)
 [facility] Provider Note   CSN: [id number] Arrival date & time: 04/15/24  1732     Patient presents with: Hip Pain   [name] is a 83 y.o. female.  {Add pertinent medical, surgical, social history, OB history to HPI:3559} 83 year old female with a history of ovarian cancer in remission and left hip pain who presents emergency department with left hip pain.  Patient reports over the past month she has been having pain on the outside of her left hip.  Has been seeing orthopedics who believes that she likely has greater trochanteric bursitis or gluteal tendon tear.  Has been taking BC powder, Tylenol , and Advil  for her pain.  Did also get an injection in her hip.  Today reports that she was at the grocery store and felt a pop and it has been severe.  Has been able to bear weight and walk but is hobbling.       Prior to Admission medications   Medication Sig Start Date End Date Taking? Authorizing Provider  ALPRAZolam  (XANAX ) 0.5 MG tablet Take 1 tablet (0.5 mg total) by mouth every 12 (twelve) hours as needed for anxiety. Medication refills require office visit every 6 months. 04/14/24     amLODipine  (NORVASC ) 5 MG tablet Take 1 tablet (5 mg total) by mouth daily. 04/14/24     atorvastatin  (LIPITOR) 10 MG tablet Take 1 tablet (10 mg total) by mouth daily. 04/14/24     Cyanocobalamin (VITAMIN B-12 IJ) Inject 1 Dose as directed every 30 (thirty) days.    [provider]  FLUoxetine  (PROZAC ) 20 MG capsule Take 1 capsule (20 mg total) by mouth daily. 10/22/23     FLUoxetine  (PROZAC ) 20 MG capsule Take 1 capsule (20 mg total) by mouth daily. 04/14/24     glimepiride  (AMARYL ) 1 MG tablet Take 1 tablet (1 mg total) by mouth every morning before breakfast. Take 1\2 tablet prn after steroid injection. 04/14/24 06/13/24    glucose blood (ACCU-CHEK GUIDE TEST) test strip Use as instructed Patient taking differently: 1 each by Other route as needed  for other. 11/27/23     hydrochlorothiazide  (HYDRODIURIL ) 25 MG tablet Take 0.5 tablets (12.5 mg total) by mouth daily. 03/21/24   Krasowski, Franki Alcaide J, MD  hydrochlorothiazide  (HYDRODIURIL ) 25 MG tablet Take 1 tablet (25 mg total) by mouth daily. 04/14/24     levothyroxine  (SYNTHROID ) 100 MCG tablet Take 1 tablet (100 mcg total) by mouth daily. 04/14/24     levothyroxine  (SYNTHROID ) 88 MCG tablet Take 88 mcg by mouth daily before breakfast. 03/05/21   [provider]  LINZESS  145 MCG CAPS capsule Take 1 capsule (145 mcg total) by mouth daily. 04/14/24     losartan  (COZAAR ) 100 MG tablet Take 1 tablet (100 mg total) by mouth daily. 04/14/24     Magnesium  400 MG TABS Take 400 mg by mouth daily.    [provider]  meclizine  (ANTIVERT ) 25 MG tablet Take 1 tablet (25 mg total) by mouth 3 (three) times daily as needed for dizziness. Patient taking differently: Take 25 mg by mouth 3 (three) times daily as needed for dizziness or nausea. 02/13/24     Melatonin 10 MG TABS Take 10 mg by mouth at bedtime.    [provider]  metFORMIN  (GLUCOPHAGE -XR) 500 MG 24 hr tablet Take 1 tablet (500 mg total) by mouth 2 (two) times a day. 04/14/24       Allergies: Penicillins and Rosuvastatin   Review of Systems  Updated Vital Signs BP (!) 163/82 (BP Location: Left Arm)   Pulse 74   Temp 98 F (36.7 C) (Oral)   Resp 17   Ht 5' 5 (1.651 m)   Wt 76.7 kg   SpO2 99%   BMI 28.12 kg/m   Physical Exam Vitals and nursing note reviewed.  Constitutional:      General: She is not in acute distress.    Appearance: She is well-developed.  HENT:     Head: Normocephalic and atraumatic.     Right Ear: External ear normal.     Left Ear: External ear normal.     Nose: Nose normal.   Eyes:     Extraocular Movements: Extraocular movements intact.     Conjunctiva/sclera: Conjunctivae normal.     Pupils: Pupils are equal, round, and reactive to light.   Pulmonary:     Effort: Pulmonary effort  is normal. No respiratory distress.  Abdominal:     General: There is no distension.     Palpations: There is no mass.     Tenderness: There is no abdominal tenderness. There is no guarding.   Musculoskeletal:     Cervical back: Normal range of motion and neck supple.     Right lower leg: No edema.     Left lower leg: No edema.     Comments: Tenderness to palpation of the left hip.  No joint effusion noted.  Full range of motion of the left hip.  No overlying skin changes.  DP pulses 2+ bilaterally.  Both feet appear warm and well-perfused.   Skin:    General: Skin is warm and dry.   Neurological:     Mental Status: She is alert and oriented to person, place, and time. Mental status is at baseline.   Psychiatric:        Mood and Affect: Mood normal.     (all labs ordered are listed, but only abnormal results are displayed) Labs Reviewed - No data to display  EKG: None  Radiology: No results found.  {Document cardiac monitor, telemetry assessment procedure when appropriate:32947} Procedures   Medications Ordered in the ED  HYDROcodone -acetaminophen  (NORCO/VICODIN) 5-325 MG per tablet 1 tablet (has no administration in time range)  lidocaine  (LIDODERM ) 5 % 1 patch (has no administration in time range)      {Click here for ABCD2, HEART and other calculators REFRESH Note before signing:1}                              Medical Decision Making Amount and/or Complexity of Data Reviewed Radiology: ordered.  Risk Prescription drug management.   ***  {Document critical care time when appropriate  Document review of labs and clinical decision tools ie CHADS2VASC2, etc  Document your independent review of radiology images and any outside records  Document your discussion with family members, caretakers and with consultants  Document social determinants of health affecting pt's care  Document your decision making why or why not admission, treatments were needed:32947:::1}    Final diagnoses:  None    ED Discharge Orders     None

## 2024-04-15 NOTE — Discharge Instructions (Signed)
 You were seen for your hip pain in the emergency department.  It is likely from muscle strain or small tear.  At home, please take Tylenol  and use lidocaine  patches we have prescribed you for your pain.  You may also take the Norco we have prescribed you for any breakthrough pain that may have.  Do not take this before driving or operating heavy machinery.  Do not take this medication with alcohol .  Check your MyChart online for the results of any tests that had not resulted by the time you left the emergency department.   Call your orthopedist tomorrow about setting up an appointment.  Return immediately to the emergency department if you experience any of the following: Worsening pain, or any other concerning symptoms.    Thank you for visiting our Emergency Department. It was a pleasure taking care of you today.

## 2024-04-15 NOTE — ED Triage Notes (Signed)
 Pt states she has had hip pain, has seen [facility] Had a shot  10 days ago. Saw ortho again today and requested that she come here for a CT scan. After she returned home today she lifted a bucket and the pain brought her to her knees and she felt something pop. Increased pain bearing weight

## 2024-04-16 ENCOUNTER — Other Ambulatory Visit (HOSPITAL_COMMUNITY): Payer: Self-pay

## 2024-04-29 ENCOUNTER — Other Ambulatory Visit (HOSPITAL_COMMUNITY): Payer: Self-pay

## 2024-04-29 ENCOUNTER — Other Ambulatory Visit: Payer: Self-pay

## 2024-07-03 ENCOUNTER — Other Ambulatory Visit (HOSPITAL_COMMUNITY): Payer: Self-pay

## 2024-07-03 ENCOUNTER — Other Ambulatory Visit: Payer: Self-pay

## 2024-07-22 NOTE — Assessment & Plan Note (Signed)
 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

## 2024-07-23 ENCOUNTER — Other Ambulatory Visit (HOSPITAL_COMMUNITY): Payer: Self-pay

## 2024-07-24 ENCOUNTER — Encounter: Payer: Self-pay | Admitting: Hematology and Oncology

## 2024-07-24 ENCOUNTER — Other Ambulatory Visit: Payer: Self-pay

## 2024-07-24 ENCOUNTER — Inpatient Hospital Stay: Attending: Hematology and Oncology

## 2024-07-24 ENCOUNTER — Inpatient Hospital Stay: Admitting: Hematology and Oncology

## 2024-07-24 VITALS — BP 149/85 | HR 66 | Temp 98.2°F | Resp 18 | Ht 65.0 in | Wt 171.0 lb

## 2024-07-24 DIAGNOSIS — Z8543 Personal history of malignant neoplasm of ovary: Secondary | ICD-10-CM | POA: Insufficient documentation

## 2024-07-24 DIAGNOSIS — Z9221 Personal history of antineoplastic chemotherapy: Secondary | ICD-10-CM | POA: Diagnosis not present

## 2024-07-24 DIAGNOSIS — C562 Malignant neoplasm of left ovary: Secondary | ICD-10-CM

## 2024-07-24 LAB — COMPREHENSIVE METABOLIC PANEL WITH GFR
ALT: 15 U/L (ref 0–44)
AST: 15 U/L (ref 15–41)
Albumin: 4.1 g/dL (ref 3.5–5.0)
Alkaline Phosphatase: 65 U/L (ref 38–126)
Anion gap: 6 (ref 5–15)
BUN: 22 mg/dL (ref 8–23)
CO2: 29 mmol/L (ref 22–32)
Calcium: 10.2 mg/dL (ref 8.9–10.3)
Chloride: 104 mmol/L (ref 98–111)
Creatinine, Ser: 0.81 mg/dL (ref 0.44–1.00)
GFR, Estimated: >60 mL/min (ref >60)
Glucose, Bld: 82 mg/dL (ref 70–99)
Potassium: 4 mmol/L (ref 3.5–5.1)
Sodium: 139 mmol/L (ref 135–145)
Total Bilirubin: 0.5 mg/dL (ref 0.0–1.2)
Total Protein: 6.9 g/dL (ref 6.5–8.1)

## 2024-07-24 LAB — CBC WITH DIFFERENTIAL/PLATELET
Abs Immature Granulocytes: 0.08 K/uL — ABNORMAL HIGH (ref 0.00–0.07)
Basophils Absolute: 0.1 K/uL (ref 0.0–0.1)
Basophils Relative: 1 %
Eosinophils Absolute: 0.2 K/uL (ref 0.0–0.5)
Eosinophils Relative: 2 %
HCT: 40.8 % (ref 36.0–46.0)
Hemoglobin: 13.8 g/dL (ref 12.0–15.0)
Immature Granulocytes: 1 %
Lymphocytes Relative: 22 %
Lymphs Abs: 2 K/uL (ref 0.7–4.0)
MCH: 31.6 pg (ref 26.0–34.0)
MCHC: 33.8 g/dL (ref 30.0–36.0)
MCV: 93.4 fL (ref 80.0–100.0)
Monocytes Absolute: 0.7 K/uL (ref 0.1–1.0)
Monocytes Relative: 8 %
Neutro Abs: 6.1 K/uL (ref 1.7–7.7)
Neutrophils Relative %: 66 %
Platelets: 239 K/uL (ref 150–400)
RBC: 4.37 MIL/uL (ref 3.87–5.11)
RDW: 13.7 % (ref 11.5–15.5)
WBC: 9.1 K/uL (ref 4.0–10.5)
nRBC: 0 % (ref 0.0–0.2)

## 2024-07-24 NOTE — Progress Notes (Signed)
 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    Almarie Bedford, MD  INTERVAL HISTORY: she returns for surveillance follow-up for history of ovarian cancer She is doing well She has other health issues especially joint pain, limited range of motion in her shoulder, knee problem and others Denies abdominal bloating or changes in bowel habits  PHYSICAL EXAMINATION: ECOG PERFORMANCE STATUS: 0 - Asymptomatic  Vitals:   07/24/24 0935  BP: (!) 149/85  Pulse: 66  Resp: 18  Temp: 98.2 F (36.8 C)  SpO2: 99%   Filed Weights   07/24/24 0935  Weight: 171 lb (77.6 kg)    Relevant data reviewed during this visit included CBC and CMP

## 2024-07-25 LAB — CA 125: Cancer Antigen (CA) 125: 14.5 U/mL (ref 0.0–38.1)

## 2024-07-31 ENCOUNTER — Other Ambulatory Visit (HOSPITAL_COMMUNITY): Payer: Self-pay

## 2024-08-14 ENCOUNTER — Other Ambulatory Visit (HOSPITAL_COMMUNITY): Payer: Self-pay

## 2024-08-27 NOTE — Progress Notes (Addendum)
 Date of COVID positive in last 90 days:  PCP - Cathlyn Nash, MD Cardiologist - Lamar Fitch, MD LOV 03/21/24 Oncologist- Almarie Bedford, MD  Chest x-ray - 12/10/23 Epic EKG - 03/21/24 Epic Stress Test - 04/10/23 Epic ECHO - 04/05/23 Epic Cardiac Cath - N/A Pacemaker/ICD device last checked:N/A Spinal Cord Stimulator:N/A  Bowel Prep - N/A  Sleep Study - N/A CPAP -   Fasting Blood Sugar - 120-130 Checks Blood Sugar 1 times a day  Last dose of GLP1 agonist-  N/A GLP1 instructions:  Do not take after     Last dose of SGLT-2 inhibitors-  N/A SGLT-2 instructions:  Do not take after     Blood Thinner Instructions: N/A Last dose:   Time: Aspirin Instructions:N/A Last Dose:  Activity level: Can go up a flight of stairs and perform activities of daily living without stopping and without symptoms of chest pain or shortness of breath. Avoid stairs if able due to pain   Anesthesia review: atherosclerosis, HTN, DM, coronary artery calcification, pancytopenia, vertigo,   Patient denies shortness of breath, fever, cough and chest pain at PAT appointment  Patient verbalized understanding of instructions that were given to them at the PAT appointment. Patient was also instructed that they will need to review over the PAT instructions again at home before surgery.

## 2024-08-28 ENCOUNTER — Encounter (HOSPITAL_COMMUNITY)
Admission: RE | Admit: 2024-08-28 | Discharge: 2024-08-28 | Disposition: A | Discharge status: Home / Self Care | Source: Ambulatory Visit | Attending: Orthopedic Surgery | Admitting: Orthopedic Surgery

## 2024-08-28 ENCOUNTER — Encounter (HOSPITAL_COMMUNITY): Payer: Self-pay

## 2024-08-28 ENCOUNTER — Other Ambulatory Visit: Payer: Self-pay

## 2024-08-28 VITALS — BP 148/72 | HR 59 | Temp 98.7°F | Resp 16 | Ht 65.0 in | Wt 172.0 lb

## 2024-08-28 DIAGNOSIS — F419 Anxiety disorder, unspecified: Secondary | ICD-10-CM | POA: Insufficient documentation

## 2024-08-28 DIAGNOSIS — I10 Essential (primary) hypertension: Secondary | ICD-10-CM | POA: Diagnosis not present

## 2024-08-28 DIAGNOSIS — Z01812 Encounter for preprocedural laboratory examination: Secondary | ICD-10-CM | POA: Insufficient documentation

## 2024-08-28 DIAGNOSIS — E1151 Type 2 diabetes mellitus with diabetic peripheral angiopathy without gangrene: Secondary | ICD-10-CM | POA: Insufficient documentation

## 2024-08-28 DIAGNOSIS — M1710 Unilateral primary osteoarthritis, unspecified knee: Secondary | ICD-10-CM | POA: Diagnosis not present

## 2024-08-28 DIAGNOSIS — M7062 Trochanteric bursitis, left hip: Secondary | ICD-10-CM | POA: Insufficient documentation

## 2024-08-28 DIAGNOSIS — M069 Rheumatoid arthritis, unspecified: Secondary | ICD-10-CM | POA: Diagnosis not present

## 2024-08-28 DIAGNOSIS — E119 Type 2 diabetes mellitus without complications: Secondary | ICD-10-CM

## 2024-08-28 DIAGNOSIS — E1140 Type 2 diabetes mellitus with diabetic neuropathy, unspecified: Secondary | ICD-10-CM | POA: Insufficient documentation

## 2024-08-28 DIAGNOSIS — E039 Hypothyroidism, unspecified: Secondary | ICD-10-CM | POA: Diagnosis not present

## 2024-08-28 DIAGNOSIS — F339 Major depressive disorder, recurrent, unspecified: Secondary | ICD-10-CM | POA: Diagnosis not present

## 2024-08-28 DIAGNOSIS — K219 Gastro-esophageal reflux disease without esophagitis: Secondary | ICD-10-CM | POA: Insufficient documentation

## 2024-08-28 DIAGNOSIS — I2510 Atherosclerotic heart disease of native coronary artery without angina pectoris: Secondary | ICD-10-CM | POA: Insufficient documentation

## 2024-08-28 HISTORY — DX: Rheumatoid arthritis, unspecified: M06.9

## 2024-08-28 LAB — BASIC METABOLIC PANEL WITH GFR
Anion gap: 9 (ref 5–15)
BUN: 26 mg/dL — ABNORMAL HIGH (ref 8–23)
CO2: 27 mmol/L (ref 22–32)
Calcium: 9.8 mg/dL (ref 8.9–10.3)
Chloride: 105 mmol/L (ref 98–111)
Creatinine, Ser: 0.86 mg/dL (ref 0.44–1.00)
GFR, Estimated: >60 mL/min (ref >60)
Glucose, Bld: 108 mg/dL — ABNORMAL HIGH (ref 70–99)
Potassium: 4.6 mmol/L (ref 3.5–5.1)
Sodium: 140 mmol/L (ref 135–145)

## 2024-08-28 LAB — HEMOGLOBIN A1C
Hgb A1c MFr Bld: 6.3 % — ABNORMAL HIGH (ref 4.8–5.6)
Mean Plasma Glucose: 134.11 mg/dL

## 2024-08-28 LAB — CBC
HCT: 42.3 % (ref 36.0–46.0)
Hemoglobin: 13.9 g/dL (ref 12.0–15.0)
MCH: 31.4 pg (ref 26.0–34.0)
MCHC: 32.9 g/dL (ref 30.0–36.0)
MCV: 95.7 fL (ref 80.0–100.0)
Platelets: 250 K/uL (ref 150–400)
RBC: 4.42 MIL/uL (ref 3.87–5.11)
RDW: 14.4 % (ref 11.5–15.5)
WBC: 9.8 K/uL (ref 4.0–10.5)
nRBC: 0 % (ref 0.0–0.2)

## 2024-08-28 LAB — GLUCOSE, CAPILLARY: Glucose-Capillary: 138 mg/dL — ABNORMAL HIGH (ref 70–99)

## 2024-08-28 NOTE — Patient Instructions (Addendum)
 SURGICAL WAITING ROOM VISITATION  Patients having surgery or a procedure may have no more than 2 support people in the waiting area - these visitors may rotate.    Children under the age of 1 must have an adult with them who is not the patient.  Visitors with respiratory illnesses are discouraged from visiting and should remain at home.  If the patient needs to stay at the hospital during part of their recovery, the visitor guidelines for inpatient rooms apply. Pre-op nurse will coordinate an appropriate time for 1 support person to accompany patient in pre-op.  This support person may not rotate.    Please refer to the [facility] website for the visitor guidelines for Inpatients (after your surgery is over and you are in a regular room).    Your procedure is scheduled on: 09/08/24   Report to [facility] Main Entrance    Report to admitting at 12:45 PM   Call this number if you have problems the morning of surgery [phone]   Do not eat food :After Midnight.   After Midnight you may have the following liquids until 12:00 PM DAY OF SURGERY  Water  Non-Citrus Juices (without pulp, NO RED-Apple, White grape, White cranberry) Black Coffee (NO MILK/CREAM OR CREAMERS, sugar ok)  Clear Tea (NO MILK/CREAM OR CREAMERS, sugar ok) regular and decaf                             Plain Jell-O (NO RED)                                           Fruit ices (not with fruit pulp, NO RED)                                     Popsicles (NO RED)                                                               Sports drinks like Gatorade (NO RED)                 The day of surgery:  Drink ONE (1) Pre-Surgery G2 at 12:00 PM the morning of surgery. Drink in one sitting. Do not sip.  This drink was given to you during your hospital  pre-op appointment visit. Nothing else to drink after completing the  Pre-Surgery G2.          If you have questions, please contact your surgeon's  office.   FOLLOW BOWEL PREP AND ANY ADDITIONAL PRE OP INSTRUCTIONS YOU RECEIVED FROM YOUR SURGEON'S OFFICE!!!     Oral Hygiene is also important to reduce your risk of infection.                                    Remember - BRUSH YOUR TEETH THE MORNING OF SURGERY WITH YOUR REGULAR TOOTHPASTE  DENTURES WILL BE REMOVED PRIOR TO SURGERY PLEASE DO NOT APPLY Poly grip OR ADHESIVES!!!  Stop all vitamins and herbal supplements 7 days before surgery.   Take these medicines the morning of surgery with A SIP OF WATER : Amlodipine , Levothyroxine    DO NOT TAKE ANY ORAL DIABETIC MEDICATIONS DAY OF YOUR SURGERY  How to Manage Your Diabetes Before and After Surgery  Why is it important to control my blood sugar before and after surgery? Improving blood sugar levels before and after surgery helps healing and can limit problems. A way of improving blood sugar control is eating a healthy diet by:  Eating less sugar and carbohydrates  Increasing activity/exercise  Talking with your doctor about reaching your blood sugar goals High blood sugars (greater than 180 mg/dL) can raise your risk of infections and slow your recovery, so you will need to focus on controlling your diabetes during the weeks before surgery. Make sure that the doctor who takes care of your diabetes knows about your planned surgery including the date and location.  How do I manage my blood sugar before surgery? Check your blood sugar at least 4 times a day, starting 2 days before surgery, to make sure that the level is not too high or low. Check your blood sugar the morning of your surgery when you wake up and every 2 hours until you get to the Short Stay unit. If your blood sugar is less than 70 mg/dL, you will need to treat for low blood sugar: Do not take insulin . Treat a low blood sugar (less than 70 mg/dL) with  cup of clear juice (cranberry or apple), 4 glucose tablets, OR glucose gel. Recheck blood sugar in 15 minutes  after treatment (to make sure it is greater than 70 mg/dL). If your blood sugar is not greater than 70 mg/dL on recheck, call [phone] for further instructions. Report your blood sugar to the short stay nurse when you get to Short Stay.  If you are admitted to the hospital after surgery: Your blood sugar will be checked by the staff and you will probably be given insulin  after surgery (instead of oral diabetes medicines) to make sure you have good blood sugar levels. The goal for blood sugar control after surgery is 80-180 mg/dL.   WHAT DO I DO ABOUT MY DIABETES MEDICATION?  Do not take oral diabetes medicines (pills) the morning of surgery.  Reviewed and Endorsed by [facility] Patient Education Committee, August 2015             You may not have any metal on your body including hair pins, jewelry, and body piercing             Do not wear make-up, lotions, powders, perfumes, or deodorant  Do not wear nail polish including gel and S&S, artificial/acrylic nails, or any other type of covering on natural nails including finger and toenails. If you have artificial nails, gel coating, etc. that needs to be removed by a nail salon please have this removed prior to surgery or surgery may need to be canceled/ delayed if the surgeon/ anesthesia feels like they are unable to be safely monitored.   Do not shave  48 hours prior to surgery.               Men may shave face and neck.   Do not bring valuables to the hospital. [facility] IS NOT             RESPONSIBLE   FOR VALUABLES.   Contacts, glasses, dentures or bridgework may not be worn into  surgery.  DO NOT BRING YOUR HOME MEDICATIONS TO THE HOSPITAL. PHARMACY WILL DISPENSE MEDICATIONS LISTED ON YOUR MEDICATION LIST TO YOU DURING YOUR ADMISSION IN THE HOSPITAL!    Patients discharged on the day of surgery will not be allowed to drive home.  Someone NEEDS to stay with you for the first 24 hours after anesthesia.              Please  read over the following fact sheets you were given: IF YOU HAVE QUESTIONS ABOUT YOUR PRE-OP INSTRUCTIONS PLEASE CALL [phone] [place].   If you received a COVID test during your pre-op visit  it is requested that you wear a mask when out in public, stay away from anyone that may not be feeling well and notify your surgeon if you develop symptoms. If you test positive for Covid or have been in contact with anyone that has tested positive in the last 10 days please notify you surgeon.    [facility] - Preparing for Surgery Before surgery, you can play an important role.  Because skin is not sterile, your skin needs to be as free of germs as possible.  You can reduce the number of germs on your skin by washing with CHG (chlorahexidine gluconate) soap before surgery.  CHG is an antiseptic cleaner which kills germs and bonds with the skin to continue killing germs even after washing. Please DO NOT use if you have an allergy to CHG or antibacterial soaps.  If your skin becomes reddened/irritated stop using the CHG and inform your nurse when you arrive at Short Stay. Do not shave (including legs and underarms) for at least 48 hours prior to the first CHG shower.  You may shave your face/neck.  Please follow these instructions carefully:  1.  Shower with CHG Soap the night before surgery ONLY (DO NOT USE THE SOAP THE MORNING OF SURGERY).  2.  If you choose to wash your hair, wash your hair first as usual with your normal  shampoo.  3.  After you shampoo, rinse your hair and body thoroughly to remove the shampoo.                             4.  Use CHG as you would any other liquid soap.  You can apply chg directly to the skin and wash.  Gently with a scrungie or clean washcloth.  5.  Apply the CHG Soap to your body ONLY FROM THE NECK DOWN.   Do   not use on face/ open                           Wound or open sores. Avoid contact with eyes, ears mouth and   genitals (private parts).                        Wash face,  Genitals (private parts) with your normal soap.             6.  Wash thoroughly, paying special attention to the area where your    surgery  will be performed.  7.  Thoroughly rinse your body with warm water  from the neck down.  8.  DO NOT shower/wash with your normal soap after using and rinsing off the CHG Soap.                9.  Pat yourself dry with a clean towel.            10.  Wear clean pajamas.            11.  Place clean sheets on your bed the night of your first shower and do not  sleep with pets. Day of Surgery : Do not apply any CHG, lotions/deodorants the morning of surgery.  Please wear clean clothes to the hospital/surgery center.  FAILURE TO FOLLOW THESE INSTRUCTIONS MAY RESULT IN THE CANCELLATION OF YOUR SURGERY  PATIENT SIGNATURE_________________________________  NURSE SIGNATURE__________________________________  ________________________________________________________________________

## 2024-09-01 ENCOUNTER — Encounter (HOSPITAL_COMMUNITY): Payer: Self-pay

## 2024-09-01 NOTE — Progress Notes (Signed)
 Case: [id number] Date/Time: 09/08/24 1330   Procedure: REPAIR, TENDON, GLUTEUS MINIMUS (Left) - Left hip bursectomy and gluteal tendon repair   Anesthesia type: Choice   Pre-op diagnosis: Left hip trochanteric bursitis and gluteal tendon tear   Location: [facility] ROOM 09 / [facility]   Surgeons: Melodi Lerner, MD       DISCUSSION: [name] is an 83 yo female with PMH of HTN, CAD (by imaging), lung nodules, bronchiectasis, GERD, hypothyroid, T2DM, hx of ovarian cancer s/p resection and chemo (2019), arthritis (RA, OA), anxiety, depression.  Prior anesthesia complications include PONV  Patient follows with Cardiology for hx of CAD which is managed medically. Last seen by Dr. Krasowski on 03/21/24. She reported dizziness with standing up and her diuretic dose was decreased. Otherwise stable. Prior stress testing in 6/20204 was low risk an Echo showed normal LVEF 60-65%, grade I DD, mild MR.  Patient previously followed with Pulmonology and was last seen in 2021 after infection. CT Chest showed stable chronic bronchiectasis and pulmonary nodules. CXR in 11/2023 showed no active disease.  Activity level: Can go up a flight of stairs and perform activities of daily living without stopping and without symptoms of chest pain or shortness of breath. Avoid stairs if able due to pain        VS: BP (!) 148/72   Pulse (!) 59   Temp 37.1 C (Oral)   Resp 16   Ht 5' 5 (1.651 m)   Wt 78 kg   SpO2 100%   BMI 28.62 kg/m   PROVIDERS: Thurmond Cathlyn LABOR., MD   LABS: Labs reviewed: Acceptable for surgery. (all labs ordered are listed, but only abnormal results are displayed)  Labs Reviewed  HEMOGLOBIN A1C - Abnormal; Notable for the following components:      Result Value   Hgb A1c MFr Bld 6.3 (*)    All other components within normal limits  BASIC METABOLIC PANEL WITH GFR - Abnormal; Notable for the following components:   Glucose, Bld 108 (*)    BUN 26 (*)    All other components within normal limits   GLUCOSE, CAPILLARY - Abnormal; Notable for the following components:   Glucose-Capillary 138 (*)    All other components within normal limits  CBC      EKG 03/21/24:  Sinus bradycardia, rate 56 Low voltage QRS Borderline ECG  Stress test 04/09/24:    The study is normal. The study is low risk.   No ST deviation was noted.   Left ventricular function is normal. Nuclear stress EF: 72 %. The left ventricular ejection fraction is hyperdynamic (>65%). End diastolic cavity size is normal.  Echo 04/05/23:  IMPRESSIONS    1. Left ventricular ejection fraction, by estimation, is 60 to 65%. The left ventricle has normal function. The left ventricle has no regional wall motion abnormalities. Left ventricular diastolic parameters are consistent with Grade I diastolic dysfunction (impaired relaxation).  2. Right ventricular systolic function is normal. The right ventricular size is normal.  3. The mitral valve is normal in structure. Mild mitral valve regurgitation. No evidence of mitral stenosis.  4. The aortic valve is normal in structure. Aortic valve regurgitation is not visualized. Aortic valve sclerosis is present, with no evidence of aortic valve stenosis.  5. The inferior vena cava is normal in size with greater than 50% respiratory variability, suggesting right atrial pressure of 3 mmHg.  Comparison(s): Echocardiogram done 12/17/18 showed an EF of >70%. Past Medical History:  Diagnosis Date  Acquired hypothyroidism 12/13/2015   Last Assessment & Plan:   Relevant Hx:  Course:  Daily Update:  Today's Plan:update her TSH for her on her current dose of med     Electronically signed by: Eleanor Merlynn Lady, NP  01/11/16 [phone]   Acute diverticulitis 01/06/2022   Anemia due to antineoplastic chemotherapy 08/19/2019   Arthritis    Arthritis, rheumatoid (HCC)    Atherosclerosis 12/13/2015   Axillary lymphadenopathy 11/04/2018   Bilateral thumb pain 12/04/2022   Breast pain,  right 11/04/2018   Bronchiectasis without complication (HCC) 07/08/2019   Bruising 09/22/2020   Change in nail appearance 02/03/2019   Formatting of this note might be different from the original. Recommend review with Onc and then Derm if not clear. Formatting of this note might be different from the original. Recommend review with Onc and then Derm if not clear.   Chemotherapy adverse reaction 03/11/2018   Chronic back pain greater than 3 months duration 09/24/2018   Chronic idiopathic constipation 03/13/2018   Chronic left shoulder pain 01/11/2021   Coronary artery calcification 11/28/2018   Formatting of this note might be different from the original. Seen on CT of chest 11/2018 Formatting of this note might be different from the original. 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) 12/13/2015   Last Assessment & Plan:   Relevant Hx:  Course:  Daily Update:  Today's Plan:we had a long discussion about med choices, she had been given onglyza samples when I saw her briefly in the lobby about 2 weeks ago to hold till she came back and did her labs, we discussed med choices with victoza  and byetta  which worked better for her though she could not afford them, discussed the newer meds farxiga a   Elevated liver function tests 02/06/2018   Essential hypertension 12/13/2015   Last Assessment & Plan:   Relevant Hx:  Course:  Daily Update:  Today's Plan:this is stable for her and will continue to follow this for her     Electronically signed by: Eleanor Merlynn Lady, NP  01/11/16 [phone]   Genetic testing 06/03/2018   TumorNext HRD + CancerNext germline testing was ordered.     The CancerNext gene panel offered by 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   Intractable pain    Left lower quadrant abdominal pain 01/02/2022   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   Lumbar disc herniation 02/09/2021   Lumbosacral radiculopathy    Major depressive disorder, recurrent 12/13/2015   Malaise and fatigue 12/13/2015   Last Assessment & Plan:   Relevant Hx:  Course:  Daily Update:  Today's Plan:she is walking about 4-5 days per week and going to the gym and has gotten up to speed 4 on a level 3 , she feels her energy drain though is more sugar related and that is bothersome to her     Electronically signed by: Eleanor Merlynn Lady, NP  01/11/16 0913   Malignant neoplasm of left ovary (HCC)    Mixed hyperlipidemia 12/13/2015  Last Assessment & Plan:   Relevant Hx:  Course:  Daily Update:  Today's Plan:update her lipids for her today      Electronically signed by: Eleanor Merlynn Lady, NP  01/11/16 [phone]   Multiple lung nodules on CT 07/25/2018   Mycobacteria, 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 Probably related to to crestor .  Avoid statins. Formatting of this note might be different from the original. Probably related to to crestor .  Avoid statins.   Osteoarthritis of knee 12/04/2022   Other constipation 03/01/2018   Ovarian cancer (HCC) 01/31/2018    Pancytopenia, acquired (HCC) 10/25/2018   Physical debility 07/25/2018   Pneumonia    its been 4 years    PONV (postoperative nausea and vomiting)    Post-menopause 02/21/2016   Formatting of this note might be different from the original. Nl dexa 07/2020. Repeat 2025. Formatting of this note might be different from the original. Nl dexa 07/2020. Repeat 2025.   Posterior tibial tendonitis 12/13/2015   Primary insomnia 12/13/2015   Primary osteoarthritis involving multiple joints 12/13/2015   Pulmonary hypertension (HCC) 11/28/2018   Formatting of this note might be different from the original. Seen on most recent CT scan done by oncology 11/2018 with enlarged pulmonary artery Formatting of this note might be different from the original. 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   Urticaria    Vitamin B12 deficiency 12/13/2015   Weight loss 04/22/2020    Past Surgical History:  Procedure Laterality Date   BLADDER SUSPENSION  2002   BREAST SURGERY     several benign cysts removed; surgeries from 1975- 1990    CHOLECYSTECTOMY  1988   DECOMPRESSIVE LUMBAR LAMINECTOMY LEVEL 1 N/A 02/09/2021   Procedure: DECOMPRESSIVE LUMBAR LAMINECTOMY LEVEL 1 lumbar three-four;  Surgeon: Burnetta Aures, MD;  Location: [facility] OR;  Service: Orthopedics;  Laterality: N/A;   EYE SURGERY  2010 amd 2012   cataracts removed bilateral    IR FLUORO GUIDE PORT INSERTION RIGHT  02/28/2018   IR REMOVAL TUN ACCESS W/ PORT W/O FL MOD SED  03/28/2022   IR US  GUIDE VASC ACCESS RIGHT  02/28/2018   KNEE ARTHROSCOPY Left    l knee x2   KYPHOPLASTY N/A 02/09/2021   Procedure: KYPHOPLASTY lumbar four;  Surgeon: Burnetta Aures, MD;  Location: [facility] OR;  Service: Orthopedics;  Laterality: N/A;   PARATHYROIDECTOMY  2010   REVERSE SHOULDER ARTHROPLASTY Right 04/26/2023   Procedure: REVERSE SHOULDER ARTHROPLASTY;  Surgeon:  Melita Drivers, MD;  Location: [facility];  Service: Orthopedics;  Laterality: Right;    ROBOTIC ASSISTED SALPINGO OOPHERECTOMY Bilateral 01/31/2018   Procedure: XI ROBOTIC ASSISTED BILATERAL SALPINGO OOPHORECTOMY, STAGING, LAPAROTOMY, PELVIC AND PARA AORTIC LYMPH NODE DISSECTION, OMENTECTOMY;  Surgeon: Anitra Freddy NOVAK, MD;  Location: [facility];  Service: Gynecology;  Laterality: Bilateral;   ROTATOR CUFF REPAIR  2005   right    TOTAL KNEE ARTHROPLASTY Left 12/08/2014   Procedure: LEFT  TOTAL KNEE ARTHROPLASTY;  Surgeon: Tanda DELENA Heading, MD;  Location: [facility];  Service: Orthopedics;  Laterality: Left;   VAGINAL HYSTERECTOMY  1984    MEDICATIONS:  ALPRAZolam  (XANAX ) 0.5 MG tablet   amLODipine  (NORVASC ) 5 MG tablet   atorvastatin  (LIPITOR) 10 MG tablet   cyanocobalamin (VITAMIN B12) 1000 MCG/ML injection   FLUoxetine  (PROZAC ) 20 MG capsule   glucose blood (ACCU-CHEK GUIDE TEST) test strip   hydrochlorothiazide  (HYDRODIURIL ) 25 MG tablet   HYDROcodone -acetaminophen  (NORCO/VICODIN) 5-325 MG tablet   levothyroxine  (SYNTHROID ) 100 MCG tablet   lidocaine  (LIDODERM ) 5 %   lidocaine  4 %   LINZESS  145 MCG CAPS capsule   losartan  (COZAAR ) 100 MG tablet   Magnesium  400 MG TABS   meclizine  (ANTIVERT ) 25 MG tablet   Melatonin 10 MG TABS   metFORMIN  (GLUCOPHAGE -XR) 500 MG 24 hr tablet   methocarbamol  (ROBAXIN ) 500 MG tablet   No current facility-administered medications for this encounter.    Burnard CHRISTELLA Odis DEVONNA [facility] Short Stay/Anesthesiology [facility] Phone [phone] 09/01/2024 2:51 PM

## 2024-09-01 NOTE — Anesthesia Preprocedure Evaluation (Addendum)
 Anesthesia Evaluation  Patient identified by MRN, date of birth, ID band Patient awake    Reviewed: Allergy & Precautions, NPO status , Patient's Chart, lab work & pertinent test results  History of Anesthesia Complications (+) PONV and history of anesthetic complications  Airway Mallampati: II  TM Distance: >3 FB Neck ROM: Full    Dental no notable dental hx. (+) Teeth Intact, Dental Advisory Given   Pulmonary neg pulmonary ROS bronchiectasis   Pulmonary exam normal breath sounds clear to auscultation       Cardiovascular hypertension, Pt. on medications (-) angina + CAD and + Peripheral Vascular Disease  (-) Past MI Normal cardiovascular exam+ Valvular Problems/Murmurs (mild) MR  Rhythm:Regular Rate:Normal  Ef 65%   Neuro/Psych  PSYCHIATRIC DISORDERS  Depression       GI/Hepatic ,GERD  Controlled,,  Endo/Other  diabetes, Type 2, Oral Hypoglycemic AgentsHypothyroidism    Renal/GU      Musculoskeletal  (+) Arthritis , Osteoarthritis and Rheumatoid disorders,    Abdominal   Peds  Hematology   Anesthesia Other Findings All:PCN, rosuvstatin  Reproductive/Obstetrics                              Anesthesia Physical Anesthesia Plan  ASA: 3  Anesthesia Plan: General   Post-op Pain Management:    Induction: Intravenous  PONV Risk Score and Plan: 4 or greater and Propofol  infusion, TIVA, Treatment may vary due to age or medical condition and Ondansetron   Airway Management Planned: LMA  Additional Equipment: None  Intra-op Plan:   Post-operative Plan: Extubation in OR  Informed Consent: I have reviewed the patients History and Physical, chart, labs and discussed the procedure including the risks, benefits and alternatives for the proposed anesthesia with the patient or authorized representative who has indicated his/her understanding and acceptance.     Dental advisory  given  Plan Discussed with: CRNA and Surgeon  Anesthesia Plan Comments: (See PAT note from 11/13)         Anesthesia Quick Evaluation

## 2024-09-06 ENCOUNTER — Other Ambulatory Visit (HOSPITAL_COMMUNITY): Payer: Self-pay

## 2024-09-08 ENCOUNTER — Encounter (HOSPITAL_COMMUNITY)
Admission: RE | Disposition: A | Discharge status: Home / Self Care | Payer: Self-pay | Source: Home / Self Care | Attending: Orthopedic Surgery

## 2024-09-08 ENCOUNTER — Ambulatory Visit (HOSPITAL_COMMUNITY): Payer: Self-pay | Admitting: Medical

## 2024-09-08 ENCOUNTER — Encounter (HOSPITAL_COMMUNITY): Payer: Self-pay | Admitting: Orthopedic Surgery

## 2024-09-08 ENCOUNTER — Other Ambulatory Visit: Payer: Self-pay

## 2024-09-08 ENCOUNTER — Other Ambulatory Visit (HOSPITAL_COMMUNITY): Payer: Self-pay

## 2024-09-08 ENCOUNTER — Ambulatory Visit (HOSPITAL_BASED_OUTPATIENT_CLINIC_OR_DEPARTMENT_OTHER): Payer: Self-pay | Admitting: Anesthesiology

## 2024-09-08 ENCOUNTER — Ambulatory Visit (HOSPITAL_COMMUNITY)
Admission: RE | Admit: 2024-09-08 | Discharge: 2024-09-08 | Disposition: A | Discharge status: Home / Self Care | Attending: Orthopedic Surgery | Admitting: Orthopedic Surgery

## 2024-09-08 DIAGNOSIS — E1140 Type 2 diabetes mellitus with diabetic neuropathy, unspecified: Secondary | ICD-10-CM | POA: Insufficient documentation

## 2024-09-08 DIAGNOSIS — M1990 Unspecified osteoarthritis, unspecified site: Secondary | ICD-10-CM | POA: Diagnosis not present

## 2024-09-08 DIAGNOSIS — K219 Gastro-esophageal reflux disease without esophagitis: Secondary | ICD-10-CM | POA: Diagnosis not present

## 2024-09-08 DIAGNOSIS — Z7984 Long term (current) use of oral hypoglycemic drugs: Secondary | ICD-10-CM | POA: Insufficient documentation

## 2024-09-08 DIAGNOSIS — I2510 Atherosclerotic heart disease of native coronary artery without angina pectoris: Secondary | ICD-10-CM | POA: Insufficient documentation

## 2024-09-08 DIAGNOSIS — M7062 Trochanteric bursitis, left hip: Secondary | ICD-10-CM

## 2024-09-08 DIAGNOSIS — I10 Essential (primary) hypertension: Secondary | ICD-10-CM | POA: Insufficient documentation

## 2024-09-08 DIAGNOSIS — M069 Rheumatoid arthritis, unspecified: Secondary | ICD-10-CM | POA: Diagnosis not present

## 2024-09-08 DIAGNOSIS — E1151 Type 2 diabetes mellitus with diabetic peripheral angiopathy without gangrene: Secondary | ICD-10-CM | POA: Diagnosis not present

## 2024-09-08 DIAGNOSIS — E039 Hypothyroidism, unspecified: Secondary | ICD-10-CM | POA: Insufficient documentation

## 2024-09-08 DIAGNOSIS — X58XXXA Exposure to other specified factors, initial encounter: Secondary | ICD-10-CM | POA: Insufficient documentation

## 2024-09-08 DIAGNOSIS — S76019A Strain of muscle, fascia and tendon of unspecified hip, initial encounter: Secondary | ICD-10-CM | POA: Insufficient documentation

## 2024-09-08 DIAGNOSIS — E119 Type 2 diabetes mellitus without complications: Secondary | ICD-10-CM | POA: Diagnosis not present

## 2024-09-08 DIAGNOSIS — F32A Depression, unspecified: Secondary | ICD-10-CM | POA: Diagnosis not present

## 2024-09-08 DIAGNOSIS — Z79899 Other long term (current) drug therapy: Secondary | ICD-10-CM | POA: Diagnosis not present

## 2024-09-08 DIAGNOSIS — S76012A Strain of muscle, fascia and tendon of left hip, initial encounter: Secondary | ICD-10-CM | POA: Diagnosis not present

## 2024-09-08 HISTORY — PX: GLUTEUS MINIMUS REPAIR: SHX5843

## 2024-09-08 HISTORY — DX: Trochanteric bursitis, left hip: M70.62

## 2024-09-08 LAB — GLUCOSE, CAPILLARY
Glucose-Capillary: 139 mg/dL — ABNORMAL HIGH (ref 70–99)
Glucose-Capillary: 130 mg/dL — ABNORMAL HIGH (ref 70–99)

## 2024-09-08 SURGERY — REPAIR, TENDON, GLUTEUS MINIMUS
Anesthesia: General | Laterality: Left

## 2024-09-08 MED ORDER — DEXAMETHASONE SOD PHOSPHATE PF 10 MG/ML IJ SOLN
INTRAMUSCULAR | Status: DC | PRN
  Administered 2024-09-08: 8 mg via INTRAVENOUS

## 2024-09-08 MED ORDER — ACETAMINOPHEN 10 MG/ML IV SOLN
1000.0000 mg | Freq: Once | INTRAVENOUS | Status: DC | PRN
Start: 2024-09-08 — End: 2024-09-08

## 2024-09-08 MED ORDER — ONDANSETRON HCL 4 MG/2ML IJ SOLN: 4.0000 mg | Freq: Once | INTRAMUSCULAR | Status: DC | PRN

## 2024-09-08 MED ORDER — CEFAZOLIN SODIUM-DEXTROSE 2-4 GM/100ML-% IV SOLN
2.0000 g | INTRAVENOUS | Status: AC
  Administered 2024-09-08: 2 g via INTRAVENOUS
  Filled 2024-09-08: qty 100

## 2024-09-08 MED ORDER — BUPIVACAINE HCL 0.25 % IJ SOLN
INTRAMUSCULAR | Status: DC | PRN
Start: 2024-09-08 — End: 2024-09-08
  Administered 2024-09-08: 30 mL

## 2024-09-08 MED ORDER — ONDANSETRON HCL 4 MG/2ML IJ SOLN
INTRAMUSCULAR | Status: AC
  Filled 2024-09-08: qty 2

## 2024-09-08 MED ORDER — OXYCODONE HCL 5 MG/5ML PO SOLN: 5.0000 mg | Freq: Once | ORAL | Status: DC | PRN

## 2024-09-08 MED ORDER — PROPOFOL 10 MG/ML IV BOLUS
INTRAVENOUS | Status: DC | PRN
  Administered 2024-09-08: 170 mg via INTRAVENOUS
  Administered 2024-09-08: 20 mg via INTRAVENOUS

## 2024-09-08 MED ORDER — LACTATED RINGERS IV SOLN: INTRAVENOUS | Status: DC

## 2024-09-08 MED ORDER — DEXAMETHASONE SOD PHOSPHATE PF 10 MG/ML IJ SOLN: 8.0000 mg | Freq: Once | INTRAMUSCULAR | Status: DC

## 2024-09-08 MED ORDER — METHOCARBAMOL 500 MG PO TABS: 500.0000 mg | ORAL_TABLET | Freq: Four times a day (QID) | ORAL | 0 refills | Status: AC | PRN

## 2024-09-08 MED ORDER — STERILE WATER FOR IRRIGATION IR SOLN
Status: DC | PRN
  Administered 2024-09-08: 2000 mL

## 2024-09-08 MED ORDER — INSULIN ASPART 100 UNIT/ML IJ SOLN: 0.0000 [IU] | INTRAMUSCULAR | Status: DC | PRN

## 2024-09-08 MED ORDER — LINZESS 145 MCG PO CAPS
145.0000 ug | ORAL_CAPSULE | Freq: Every day | ORAL | 1 refills | Status: DC
  Filled 2024-09-08: qty 90, 90d supply, fill #0

## 2024-09-08 MED ORDER — FENTANYL CITRATE (PF) 50 MCG/ML IJ SOSY
PREFILLED_SYRINGE | INTRAMUSCULAR | Status: AC
  Filled 2024-09-08: qty 2

## 2024-09-08 MED ORDER — PHENYLEPHRINE 80 MCG/ML (10ML) SYRINGE FOR IV PUSH (FOR BLOOD PRESSURE SUPPORT)
PREFILLED_SYRINGE | INTRAVENOUS | Status: DC | PRN
  Administered 2024-09-08 (×3): 160 ug via INTRAVENOUS

## 2024-09-08 MED ORDER — ACETAMINOPHEN 10 MG/ML IV SOLN
1000.0000 mg | Freq: Four times a day (QID) | INTRAVENOUS | Status: DC
  Administered 2024-09-08: 1000 mg via INTRAVENOUS
  Filled 2024-09-08: qty 100

## 2024-09-08 MED ORDER — ASPIRIN 81 MG PO TBEC: 81.0000 mg | DELAYED_RELEASE_TABLET | Freq: Every day | ORAL | 0 refills | Status: AC

## 2024-09-08 MED ORDER — BUPIVACAINE-EPINEPHRINE (PF) 0.25% -1:200000 IJ SOLN
INTRAMUSCULAR | Status: AC
Start: 2024-09-08 — End: 2024-09-08
  Filled 2024-09-08: qty 30

## 2024-09-08 MED ORDER — ONDANSETRON HCL 4 MG/2ML IJ SOLN
INTRAMUSCULAR | Status: DC | PRN
  Administered 2024-09-08: 4 mg via INTRAVENOUS

## 2024-09-08 MED ORDER — CHLORHEXIDINE GLUCONATE 0.12 % MT SOLN
15.0000 mL | Freq: Once | OROMUCOSAL | Status: AC
  Administered 2024-09-08: 15 mL via OROMUCOSAL

## 2024-09-08 MED ORDER — PROPOFOL 500 MG/50ML IV EMUL
INTRAVENOUS | Status: DC | PRN
  Administered 2024-09-08: 125 ug/kg/min via INTRAVENOUS

## 2024-09-08 MED ORDER — EPHEDRINE SULFATE-NACL 50-0.9 MG/10ML-% IV SOSY
PREFILLED_SYRINGE | INTRAVENOUS | Status: DC | PRN
  Administered 2024-09-08 (×2): 5 mg via INTRAVENOUS

## 2024-09-08 MED ORDER — HYDROCODONE-ACETAMINOPHEN 5-325 MG PO TABS: 1.0000 | ORAL_TABLET | Freq: Four times a day (QID) | ORAL | 0 refills | Status: DC | PRN

## 2024-09-08 MED ORDER — FENTANYL CITRATE (PF) 100 MCG/2ML IJ SOLN
INTRAMUSCULAR | Status: DC | PRN
  Administered 2024-09-08 (×2): 25 ug via INTRAVENOUS
  Administered 2024-09-08: 50 ug via INTRAVENOUS

## 2024-09-08 MED ORDER — FENTANYL CITRATE (PF) 100 MCG/2ML IJ SOLN
INTRAMUSCULAR | Status: AC
  Filled 2024-09-08: qty 2

## 2024-09-08 MED ORDER — 0.9 % SODIUM CHLORIDE (POUR BTL) OPTIME
TOPICAL | Status: DC | PRN
  Administered 2024-09-08: 1000 mL

## 2024-09-08 MED ORDER — ORAL CARE MOUTH RINSE: 15.0000 mL | Freq: Once | OROMUCOSAL | Status: AC

## 2024-09-08 MED ORDER — OXYCODONE HCL 5 MG PO TABS: 5.0000 mg | ORAL_TABLET | Freq: Once | ORAL | Status: DC | PRN

## 2024-09-08 MED ORDER — PROPOFOL 10 MG/ML IV BOLUS
INTRAVENOUS | Status: AC
  Filled 2024-09-08: qty 20

## 2024-09-08 MED ORDER — LIDOCAINE HCL (CARDIAC) PF 100 MG/5ML IV SOSY
PREFILLED_SYRINGE | INTRAVENOUS | Status: DC | PRN
  Administered 2024-09-08: 80 mg via INTRAVENOUS

## 2024-09-08 MED ORDER — FENTANYL CITRATE (PF) 50 MCG/ML IJ SOSY
25.0000 ug | PREFILLED_SYRINGE | INTRAMUSCULAR | Status: DC | PRN
  Administered 2024-09-08 (×2): 50 ug via INTRAVENOUS

## 2024-09-08 MED ORDER — EPHEDRINE 5 MG/ML INJ
INTRAVENOUS | Status: AC
Start: 2024-09-08 — End: 2024-09-08
  Filled 2024-09-08: qty 5

## 2024-09-08 SURGICAL SUPPLY — 37 items
ANCHOR SUT BIO SW 4.75X19.1 (Anchor) IMPLANT
BAG COUNTER SPONGE SURGICOUNT (BAG) IMPLANT
BAG ZIPLOCK 12X15 (MISCELLANEOUS) ×1 IMPLANT
BIT DRILL 2.8X128 (BIT) IMPLANT
COVER SURGICAL LIGHT HANDLE (MISCELLANEOUS) ×1 IMPLANT
DERMABOND ADVANCED .7 DNX12 (GAUZE/BANDAGES/DRESSINGS) ×1 IMPLANT
DRAPE POUCH INSTRU U-SHP 10X18 (DRAPES) ×1 IMPLANT
DRAPE SURG ORHT 6 SPLT 77X108 (DRAPES) ×2 IMPLANT
DRAPE U-SHAPE 47X51 STRL (DRAPES) ×1 IMPLANT
DRSG AQUACEL AG ADV 3.5X10 (GAUZE/BANDAGES/DRESSINGS) ×1 IMPLANT
DURAPREP 26ML APPLICATOR (WOUND CARE) ×1 IMPLANT
ELECT REM PT RETURN 15FT ADLT (MISCELLANEOUS) ×1 IMPLANT
EVACUATOR 1/8 PVC DRAIN (DRAIN) IMPLANT
FACESHIELD WRAPAROUND OR TEAM (MASK) ×2 IMPLANT
GLOVE BIO SURGEON STRL SZ 6.5 (GLOVE) IMPLANT
GLOVE BIO SURGEON STRL SZ8 (GLOVE) ×1 IMPLANT
GLOVE BIOGEL PI IND STRL 7.0 (GLOVE) ×1 IMPLANT
GLOVE BIOGEL PI IND STRL 8 (GLOVE) ×1 IMPLANT
GOWN STRL REUS W/ TWL LRG LVL3 (GOWN DISPOSABLE) ×2 IMPLANT
KIT BASIN OR (CUSTOM PROCEDURE TRAY) ×1 IMPLANT
KIT TURNOVER KIT A (KITS) ×1 IMPLANT
MANIFOLD NEPTUNE II (INSTRUMENTS) ×1 IMPLANT
NDL MA TROC 1/2 (NEEDLE) ×1 IMPLANT
NDL SAFETY ECLIPSE 18X1.5 (NEEDLE) ×1 IMPLANT
NS IRRIG 1000ML POUR BTL (IV SOLUTION) ×1 IMPLANT
PACK TOTAL JOINT (CUSTOM PROCEDURE TRAY) ×1 IMPLANT
PASSER SUT SWANSON 36MM LOOP (INSTRUMENTS) IMPLANT
PENCIL SMOKE EVACUATOR (MISCELLANEOUS) ×1 IMPLANT
PROTECTOR NERVE ULNAR (MISCELLANEOUS) ×1 IMPLANT
SUT ETHIBOND NAB CT1 #1 30IN (SUTURE) IMPLANT
SUT MNCRL AB 4-0 PS2 18 (SUTURE) ×1 IMPLANT
SUT VIC AB 2-0 CT1 TAPERPNT 27 (SUTURE) ×2 IMPLANT
SUTURE STRATFX 0 PDS 27 VIOLET (SUTURE) ×1 IMPLANT
SUTURE TAPE 1.3 40 TPR END (SUTURE) IMPLANT
SYR 30ML LL (SYRINGE) ×1 IMPLANT
TOWEL GREEN STERILE FF (TOWEL DISPOSABLE) ×1 IMPLANT
TOWEL OR DSP ST BLU DLX 10/PK (DISPOSABLE) ×1 IMPLANT

## 2024-09-08 NOTE — Anesthesia Postprocedure Evaluation (Signed)
 Anesthesia Post Note  Patient: [name]  Procedure(s) Performed: REPAIR, TENDON, GLUTEUS MINIMUS (Left)     Patient location during evaluation: PACU Anesthesia Type: General Level of consciousness: awake and alert Pain management: pain level controlled Vital Signs Assessment: post-procedure vital signs reviewed and stable Respiratory status: spontaneous breathing, nonlabored ventilation, respiratory function stable and patient connected to nasal cannula oxygen  Cardiovascular status: blood pressure returned to baseline and stable Postop Assessment: no apparent nausea or vomiting Anesthetic complications: no   No notable events documented.  Last Vitals:  Vitals:   09/08/24 1545 09/08/24 1602  BP: (!) 119/59 (!) 121/59  Pulse: 66 63  Resp: 19 18  Temp:  36.4 C  SpO2: 91% 99%    Last Pain:  Vitals:   09/08/24 1602  TempSrc:   PainSc: 6                  [name]

## 2024-09-08 NOTE — Transfer of Care (Signed)
 Immediate Anesthesia Transfer of Care Note  Patient: [name]  Procedure(s) Performed: REPAIR, TENDON, GLUTEUS MINIMUS (Left)  Patient Location: PACU  Anesthesia Type:General  Level of Consciousness: awake, alert , and oriented  Airway & Oxygen  Therapy: Patient Spontanous Breathing and Patient connected to face mask oxygen   Post-op Assessment: Report given to RN and Post -op Vital signs reviewed and stable  Post vital signs: Reviewed and stable  Last Vitals:  Vitals Value Taken Time  BP    Temp    Pulse 62 09/08/24 14:36  Resp 11 09/08/24 14:36  SpO2 100 % 09/08/24 14:36  Vitals shown include unfiled device data.  Last Pain:  Vitals:   09/08/24 1024  TempSrc: Oral  PainSc:          Complications: No notable events documented.

## 2024-09-08 NOTE — Brief Op Note (Signed)
 09/08/2024  2:07 PM  PATIENT:  [name]  83 y.o. female  PRE-OPERATIVE DIAGNOSIS:  Left hip trochanteric bursitis and gluteal tendon tear  POST-OPERATIVE DIAGNOSIS:  Left hip trochanteric bursitis and gluteal tendon tear  PROCEDURE:  Procedure(s) with comments: REPAIR, TENDON, GLUTEUS MINIMUS (Left) - Left hip bursectomy and gluteal tendon repair  SURGEON:  Surgeons and Role:    DEWAINE Melodi Lerner, MD - Primary  PHYSICIAN ASSISTANT:   ASSISTANTS: Roxie Mess, PA-C   ANESTHESIA:   general  EBL:  10 ml  BLOOD ADMINISTERED:none  DRAINS: none   LOCAL MEDICATIONS USED:  MARCAINE      COUNTS:  YES  TOURNIQUET:  * No tourniquets in log *  DICTATION: .Other Dictation: Dictation Number [id number]  PLAN OF CARE: Discharge to home after PACU  PATIENT DISPOSITION:  PACU - hemodynamically stable.

## 2024-09-08 NOTE — Interval H&P Note (Signed)
 History and Physical Interval Note:  09/08/2024 10:47 AM  [name]  has presented today for surgery, with the diagnosis of Left hip trochanteric bursitis and gluteal tendon tear.  The various methods of treatment have been discussed with the patient and family. After consideration of risks, benefits and other options for treatment, the patient has consented to  Procedure(s) with comments: REPAIR, TENDON, GLUTEUS MINIMUS (Left) - Left hip bursectomy and gluteal tendon repair as a surgical intervention.  The patient's history has been reviewed, patient examined, no change in status, stable for surgery.  I have reviewed the patient's chart and labs.  Questions were answered to the patient's satisfaction.     Dempsey Aaradhya Kysar

## 2024-09-08 NOTE — Op Note (Unsigned)
 NAME: [name], [name]. MEDICAL RECORD NO: [id number] ACCOUNT NO: [id number] DATE OF BIRTH: November 23, 1940 FACILITY: [facility] LOCATION: [facility] PHYSICIAN: Dempsey GAILS. 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  POSTOPERATIVE DIAGNOSIS:  Left hip intractable trochanteric bursitis with gluteal tendon tear.  PROCEDURE PERFORMED:  Left hip bursectomy with gluteal tendon repair.  SURGEON:  Dempsey GAILS. Zali Kamaka, MD  ASSISTANT:  Bari Mess, PA-C  ANESTHESIA:  General.  ESTIMATED BLOOD LOSS:  Less than 10 mL.  DRAINS:  None.  COMPLICATIONS:  None.  CONDITION:  Stable to recovery.  BRIEF CLINICAL NOTE:  The patient is an 83 year old female who has a long history of lateral left hip pain.  She has had previous injections and therapy and does well for a while, but then the pain recurs and becomes more persistent.  She had an MRI  scan, which showed a gluteus medius tendon tear.  She has failed nonoperative management and presents now for left hip bursectomy and gluteus medius tendon repair.  DESCRIPTION OF PROCEDURE:  After successful administration of general anesthetic, the patient is placed in a right lateral decubitus position with the left side up and held with a hip positioner.  Left lower extremity is isolated from her perineum and  pulled with plastic drapes and then the hip is prepped and draped in the usual sterile fashion.  Standard lateral incision is made centered at the tip of the greater trochanter.  It is about a 5-inch incision.  Skin is cut with a 10 blade through the  subcutaneous tissue through the fascia lata which is incised in line with the skin incision.  The bursa is identified and is excised.  There is an obvious tear in the gluteus medius with some slight retraction.  We passed sutures through the free edge of  the tendon and then prepared the tip of the greater  trochanter for the suture anchor.  The sutures are passed through the anchor and the punch is placed into the bone for passage of the anchor into the bone.  The anchor is secured and the tendon repair  came down nicely onto the greater trochanter.  The repair is then over-sewn with the fiber tapes that have been passed through the tendon previously.  This is felt to be a very stable repair.  The wound bed is then irrigated with saline solution and the  fascia lata closed with a running 0 Stratafix suture.  30 mL of 0.25% Marcaine  with epi are injected into the subcu space.  Subcu is then closed with interrupted 2-0 Vicryl and subcuticular running 4-0 Monocryl.  The incision is cleaned and dried and a  sterile dressing applied.  She is then awakened and transported to recovery in stable condition.   PUS D: 09/08/2024 2:12:04 pm T: 09/08/2024 3:14:00 pm  JOB: [id number]/ [id number]

## 2024-09-08 NOTE — Anesthesia Procedure Notes (Signed)
 Procedure Name: LMA Insertion Date/Time: 09/08/2024 1:23 PM  Performed by: Therisa Doyal CROME, CRNAPatient Re-evaluated:Patient Re-evaluated prior to induction Oxygen  Delivery Method: Circle system utilized Preoxygenation: Pre-oxygenation with 100% oxygen  Induction Type: IV induction LMA: LMA inserted LMA Size: 4.0 Number of attempts: 1 Placement Confirmation: positive ETCO2 and breath sounds checked- equal and bilateral Tube secured with: Tape Dental Injury: Teeth and Oropharynx as per pre-operative assessment

## 2024-09-08 NOTE — Discharge Instructions (Addendum)
Ollen Gross, MD [facility] [place] [address], [address] [place], [place] [address] [phone]  BURSECTOMY / GLUTEAL TENDON REPAIR POSTOPERATIVE INSTRUCTIONS  BLOOD CLOT PREVENTION Take 81 mg Aspirin once a day for three weeks following surgery. You may resume your vitamins/supplements upon discharge from the hospital.  HOME CARE INSTRUCTIONS  Remove items at home which could result in a fall. This includes throw rugs or furniture in walking pathways.  ICE to the affected hip every three hours for 30 minutes at a time and then as needed for pain and swelling. Continue to use ice on the hip for pain and swelling from surgery. You may notice swelling that will progress down to the foot and ankle. This is normal after surgery. Elevate the leg when you are not up walking on it.   Continue to use the breathing machine which will help keep your temperature down. It is common for your temperature to cycle up and down following surgery, especially at night when you are not up moving around and exerting yourself. The breathing machine keeps your lungs expanded and your temperature down. Sit on high chairs which makes it easier to stand.  Sit on chairs with arms. Use the chair arms to help push yourself up when arising.  No active abduction of the leg (do not pull leg out to the side away from the body). Use your walker for first several days until comfortable ambulating.  DIET You may resume your previous home diet once you are discharged from the hospital.  DRESSING / WOUND CARE / SHOWERING You will have an adhesive waterproof bandage over the incision. Leave this in place until your first follow-up appointment. You may shower three days after surgery, but do not submerge the incision under water.   ACTIVITY Walk with your walker as instructed. Use walker as long as suggested by your caregivers. Avoid periods of inactivity such as sitting longer than  an hour when not asleep. This helps prevent blood clots.  You may resume a sexual relationship in one month or when given the OK by your doctor.  You may return to work once you are cleared by your doctor.  Do not drive a car for 6 weeks or until released by you surgeon.  Do not drive while taking narcotics.  WEIGHT BEARING Weight bearing as tolerated with assist device (walker, cane, etc) as directed, use it until comfortable ambulating.  POSTOPERATIVE CONSTIPATION PROTOCOL Constipation - defined medically as fewer than three stools per week and severe constipation as less than one stool per week.  One of the most common issues patients have following surgery is constipation.  Even if you have a regular bowel pattern at home, your normal regimen is likely to be disrupted due to multiple reasons following surgery.  Combination of anesthesia, postoperative narcotics, change in appetite and fluid intake all can affect your bowels.  In order to avoid complications following surgery, here are some recommendations in order to help you during your recovery period.  Colace (docusate) - Pick up an over-the-counter form of Colace or another stool softener and take twice a day as long as you are requiring postoperative pain medications.  Take with a full glass of water daily.  If you experience loose stools or diarrhea, hold the colace until you stool forms back up.  If your symptoms do not get better within 1 week or if they get worse, check with your doctor.  Dulcolax (bisacodyl) - Pick  up over-the-counter and take as directed by the product packaging as needed to assist with the movement of your bowels.  Take with a full glass of water.  Use this product as needed if not relieved by Colace only.   MiraLax (polyethylene glycol) - Pick up over-the-counter to have on hand.  MiraLax is a solution that will increase the amount of water in your bowels to assist with bowel movements.  Take as directed and can  mix with a glass of water, juice, soda, coffee, or tea.  Take if you go more than two days without a movement. Do not use MiraLax more than once per day. Call your doctor if you are still constipated or irregular after using this medication for 7 days in a row.  If you continue to have problems with postoperative constipation, please contact the office for further assistance and recommendations.  If you experience "the worst abdominal pain ever" or develop nausea or vomiting, please contact the office immediatly for further recommendations for treatment.  ITCHING  If you experience itching with your medications, try taking only a single pain pill, or even half a pain pill at a time.  You can also use Benadryl over the counter for itching or also to help with sleep.   TED HOSE STOCKINGS Wear the elastic stockings on both legs for three weeks following surgery during the day but you may remove then at night for sleeping.  MEDICATIONS See your medication summary on the "After Visit Summary" that the nursing staff will review with you prior to discharge.  You may have some home medications which will be placed on hold until you complete the course of blood thinner medication.  It is important for you to complete the blood thinner medication as prescribed by your surgeon.  Continue your approved medications as instructed at time of discharge.  PRECAUTIONS If you experience chest pain or shortness of breath - call 911 immediately for transfer to the hospital emergency department.  If you develop a fever greater that 101 F, purulent drainage from wound, increased redness or drainage from wound, foul odor from the wound/dressing, or calf pain - CONTACT YOUR SURGEON.                                                   FOLLOW-UP APPOINTMENTS Make sure you keep all of your appointments after your operation with your surgeon and caregivers. You should call the office at the above phone number and make an  appointment for approximately two weeks after the date of your surgery or on the date instructed by your surgeon outlined in the "After Visit Summary".   Pick up stool softner and laxative for home use following surgery while on pain medications. Do not submerge incision under water. Please use good hand washing techniques while changing dressing each day. May shower starting three days after surgery. Please use a clean towel to pat the incision dry following showers. Continue to use ice for pain and swelling after surgery. Do not use any lotions or creams on the incision until instructed by your surgeon.

## 2024-09-08 NOTE — H&P (Signed)
 PRE-OPERATIVE H&P  Patient is admitted for left gluteal tendon repair.  Subjective:  Chief Complaint: Left hip pain  HPI: [name] is an 83 year old female who presents for recheck of left lateral hip pain. 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 In October, she experienced pain distal to the greater trochanter and received an injection in that area, which unfortunately did not provide relief. Currently, her pain is distributed throughout the lateral aspect of her hip and is no longer localized distally.  She reports having a limp and states that her hip does not feel stable. She feels that her symptoms have worsened over time. She is unable to lie on her left side due to discomfort. There is no current active infection.  Patient Active Problem List   Diagnosis Date Noted   Orthostatic hypotension 02/20/2024   Dizziness 02/20/2024   Chronic vertigo 02/13/2024   Stiffness of right shoulder joint 01/31/2024   Pain in joint of left knee 10/01/2023   Primary osteoarthritis, left shoulder 08/30/2023   Difficulty walking 08/23/2023   Acquired pes planus of right foot 08/23/2023   Unspecified disorder of synovium and tendon, unspecified lower leg 07/26/2023   Closed fracture of sesamoid bone of right foot 06/01/2023   Closed fracture of fourth metatarsal bone of left foot 06/01/2023   Closed fracture of metatarsal bone 05/23/2023   Pes planus of right foot 04/03/2023   Recurrent falls 03/22/2023   Low back pain 03/07/2023   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   Bilateral thumb pain 12/04/2022   Osteoarthritis of knee 12/04/2022   Acute diverticulitis 01/06/2022   Left lower quadrant abdominal pain 01/02/2022   Lumbar disc herniation 02/09/2021   Lumbosacral radiculopathy    Intractable pain    Right hip pain 02/07/2021   Chronic left shoulder pain 01/11/2021   Bruising 09/22/2020   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   Weight loss  92/91/7978   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   Preventive measure 10/30/2019   Anemia due to antineoplastic chemotherapy 08/19/2019   Bronchiectasis without complication (HCC) 07/08/2019   Mycobacteria, 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   UTI (urinary tract infection) 05/13/2019   Change in nail appearance 02/03/2019   Pulmonary hypertension (HCC) 11/28/2018   Coronary artery calcification 11/28/2018   Goals of care, counseling/discussion 11/26/2018   Other fatigue 11/11/2018   Breast pain, right 11/04/2018   Axillary lymphadenopathy 11/04/2018   Pancytopenia, acquired (HCC) 10/25/2018   Chronic back pain greater than 3 months duration 09/24/2018   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   Physical debility 07/25/2018   Multiple lung nodules on CT 07/25/2018   Genetic testing 06/03/2018   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   Chronic idiopathic constipation 03/13/2018   Chemotherapy adverse reaction 03/11/2018   Other constipation 03/01/2018   Elevated liver function tests 02/06/2018   Malignant neoplasm of left ovary (HCC)    Post-menopause 02/21/2016   Obesity (BMI 30-39.9) 12/14/2015   Acquired hypothyroidism 12/13/2015   Atherosclerosis 12/13/2015   Degeneration of lumbar intervertebral disc 12/13/2015   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) 12/13/2015   Essential hypertension 12/13/2015   GERD without esophagitis 12/13/2015   High risk medication use 12/13/2015   Major depressive disorder, recurrent 12/13/2015   Malaise and fatigue 12/13/2015   Mixed hyperlipidemia 12/13/2015   Posterior tibial tendonitis 12/13/2015   Primary insomnia 12/13/2015   Primary osteoarthritis involving multiple joints 12/13/2015   Vitamin B12 deficiency 12/13/2015   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  Past Medical History:  Diagnosis Date   Acquired hypothyroidism 12/13/2015   Last Assessment & Plan:   Relevant Hx:  Course:  Daily Update:  Today's Plan:update her TSH  for her on her current dose of med     Electronically signed by: Eleanor Merlynn Lady, NP  01/11/16 [phone]   Acute diverticulitis 01/06/2022   Anemia due to antineoplastic chemotherapy 08/19/2019   Arthritis    Arthritis, rheumatoid (HCC)    Atherosclerosis 12/13/2015   Axillary lymphadenopathy 11/04/2018   Bilateral thumb pain 12/04/2022   Breast pain, right 11/04/2018   Bronchiectasis without complication (HCC) 07/08/2019   Bruising 09/22/2020   Change in nail appearance 02/03/2019   Formatting of this note might be different from the original. Recommend review with Onc and then Derm if not clear. Formatting of this note might be different from the original. Recommend review with Onc and then Derm if not clear.   Chemotherapy adverse reaction 03/11/2018   Chronic back pain greater than 3 months duration 09/24/2018   Chronic idiopathic constipation 03/13/2018   Chronic left shoulder pain 01/11/2021   Coronary artery calcification 11/28/2018   Formatting of this note might be different from the original. Seen on CT of chest 11/2018 Formatting of this note might be different from the original. 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) 12/13/2015   Last Assessment & Plan:   Relevant Hx:  Course:  Daily Update:  Today's Plan:we had a long discussion about med choices, she had been given onglyza samples when I saw her briefly in the lobby about 2 weeks ago to hold till she came back and did her labs, we discussed med choices with victoza  and byetta  which worked better for her though she could not afford them, discussed the newer meds farxiga a   Elevated liver function tests 02/06/2018   Essential hypertension 12/13/2015   Last Assessment & Plan:   Relevant Hx:  Course:  Daily Update:  Today's Plan:this is stable for her and will continue to follow this for her     Electronically signed by:  Eleanor Merlynn Lady, NP  01/11/16 [phone]   Genetic testing 06/03/2018   TumorNext HRD + CancerNext germline testing was ordered.     The CancerNext gene panel offered by [employer] includes sequencing and rearrangement analysis for the following 32 genes:   APC, ATM, BARD1, BMPR1A, BRCA1, BRCA2, BRIP1, CDH1, CDK4, CDKN2A, CHEK2, DICER1, EPCAM, GREM1, HOXB13MLH1, MRE11A, MSH2, MSH6, MUTYH, NBN, NF1, PALB2, PMS2, POLD1, POLE, PTEN, RAD50, RAD51D, SMAD4, SMARCA4, STK   GERD without esophagitis 12/13/2015   Goals of care, counseling/discussion 11/26/2018   High risk medication use 12/13/2015   Hypothyroidism    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   Intractable pain    Left lower quadrant abdominal pain 01/02/2022   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   Lumbar disc herniation 02/09/2021   Lumbosacral radiculopathy    Major depressive disorder, recurrent 12/13/2015   Malaise and fatigue 12/13/2015   Last Assessment & Plan:   Relevant Hx:  Course:  Daily Update:  Today's Plan:she is walking about 4-5 days per week and going to the gym and has gotten up to speed 4 on a level 3 , she feels her energy drain though is more sugar related and that is bothersome to her     Electronically signed by: Eleanor Merlynn Lady, NP  01/11/16 0913   Malignant neoplasm of left  ovary (HCC)    Mixed hyperlipidemia 12/13/2015   Last Assessment & Plan:   Relevant Hx:  Course:  Daily Update:  Today's Plan:update her lipids for her today      Electronically signed by: Eleanor Merlynn Lady, NP  01/11/16 [phone]   Multiple lung nodules on CT 07/25/2018   Mycobacteria, 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 Probably related to to crestor .  Avoid statins. Formatting of this note might be different from the original. Probably related to to crestor .  Avoid statins.   Osteoarthritis of knee 12/04/2022   Other constipation 03/01/2018   Ovarian cancer (HCC) 01/31/2018   Pancytopenia, acquired (HCC) 10/25/2018   Physical debility 07/25/2018   Pneumonia    its been 4 years    PONV (postoperative nausea and vomiting)    Post-menopause 02/21/2016   Formatting of this note might be different from the original. Nl dexa 07/2020. Repeat 2025. Formatting of this note might be different from the original. Nl dexa 07/2020. Repeat 2025.   Posterior tibial tendonitis 12/13/2015   Primary insomnia 12/13/2015   Primary osteoarthritis involving multiple joints 12/13/2015   Pulmonary hypertension (HCC) 11/28/2018   Formatting of this note might be different from the original. Seen on most recent CT scan done by oncology 11/2018 with enlarged pulmonary artery Formatting of this note might be different from the original. 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   Urticaria    Vitamin B12 deficiency 12/13/2015   Weight loss 04/22/2020    Past Surgical History:  Procedure Laterality Date   BLADDER SUSPENSION  2002   BREAST SURGERY     several benign cysts removed; surgeries from 1975- 1990    CHOLECYSTECTOMY  1988   DECOMPRESSIVE LUMBAR LAMINECTOMY LEVEL 1 N/A 02/09/2021   Procedure: DECOMPRESSIVE LUMBAR LAMINECTOMY LEVEL 1 lumbar three-four;  Surgeon: Burnetta Aures, MD;  Location: [facility] OR;  Service: Orthopedics;  Laterality: N/A;   EYE SURGERY  2010 amd 2012   cataracts removed bilateral    IR FLUORO GUIDE PORT INSERTION RIGHT  02/28/2018   IR REMOVAL TUN ACCESS W/ PORT W/O FL MOD SED  03/28/2022   IR US   GUIDE VASC ACCESS RIGHT  02/28/2018   KNEE ARTHROSCOPY Left    l knee x2   KYPHOPLASTY N/A 02/09/2021   Procedure: KYPHOPLASTY lumbar four;  Surgeon: Burnetta Aures, MD;  Location: [facility] OR;  Service: Orthopedics;  Laterality: N/A;   PARATHYROIDECTOMY  2010   REVERSE SHOULDER ARTHROPLASTY Right 04/26/2023   Procedure: REVERSE SHOULDER ARTHROPLASTY;  Surgeon: Melita Drivers, MD;  Location: [facility] ORS;  Service: Orthopedics;  Laterality: Right;    ROBOTIC ASSISTED SALPINGO OOPHERECTOMY Bilateral 01/31/2018   Procedure: XI ROBOTIC ASSISTED BILATERAL SALPINGO OOPHORECTOMY, STAGING, LAPAROTOMY, PELVIC AND PARA AORTIC LYMPH NODE DISSECTION, OMENTECTOMY;  Surgeon: Anitra Freddy NOVAK, MD;  Location: [facility] ORS;  Service: Gynecology;  Laterality: Bilateral;   ROTATOR CUFF REPAIR  2005   right  TOTAL KNEE ARTHROPLASTY Left 12/08/2014   Procedure: LEFT TOTAL KNEE ARTHROPLASTY;  Surgeon: Tanda DELENA Heading, MD;  Location: [facility] ORS;  Service: Orthopedics;  Laterality: Left;   VAGINAL HYSTERECTOMY  1984    Prior to Admission medications   Medication Sig Start Date End Date Taking? Authorizing Provider  ALPRAZolam  (XANAX ) 0.5 MG tablet Take 1 tablet (0.5 mg total) by mouth every 12 (twelve) hours as needed for anxiety. Medication refills require office visit every 6 months. 04/14/24  Yes   amLODipine  (NORVASC ) 5 MG tablet Take 1 tablet (5 mg total) by mouth daily. 04/14/24  Yes   atorvastatin  (LIPITOR) 10 MG tablet Take 1 tablet (10 mg total) by mouth daily. 04/14/24  Yes   cyanocobalamin (VITAMIN B12) 1000 MCG/ML injection Inject 1,000 mcg into the muscle every 30 (thirty) days.   Yes [provider]  FLUoxetine  (PROZAC ) 20 MG capsule Take 1 capsule (20 mg total) by mouth daily. 04/14/24  Yes   hydrochlorothiazide  (HYDRODIURIL ) 25 MG tablet Take 1 tablet (25 mg total) by mouth daily. 04/14/24  Yes   levothyroxine  (SYNTHROID ) 100 MCG tablet Take 1 tablet (100 mcg total) by mouth daily. 04/14/24  Yes   lidocaine  4 %  Place 1 patch onto the skin daily as needed (pain).   Yes [provider]  LINZESS  145 MCG CAPS capsule Take 1 capsule (145 mcg total) by mouth daily. Patient taking differently: Take 145 mcg by mouth daily as needed (constipation). 04/14/24  Yes   losartan  (COZAAR ) 100 MG tablet Take 1 tablet (100 mg total) by mouth daily. 04/14/24  Yes   Magnesium  400 MG TABS Take 400 mg by mouth daily.   Yes [provider]  Melatonin 10 MG TABS Take 10 mg by mouth at bedtime.   Yes [provider]  metFORMIN  (GLUCOPHAGE -XR) 500 MG 24 hr tablet Take 1 tablet (500 mg total) by mouth 2 (two) times a day. 04/14/24  Yes   methocarbamol  (ROBAXIN ) 500 MG tablet Take 500 mg by mouth daily as needed for muscle spasms.   Yes [provider]  nitrofurantoin, macrocrystal-monohydrate, (MACROBID) 100 MG capsule Take 100 mg by mouth 2 (two) times daily. For bladder infection   Yes [provider]  glucose blood (ACCU-CHEK GUIDE TEST) test strip Use as instructed Patient taking differently: 1 each by Other route as needed for other. 11/27/23     HYDROcodone -acetaminophen  (NORCO/VICODIN) 5-325 MG tablet Take 1 tablet by mouth every 4 (four) hours as needed. Patient not taking: Reported on 08/26/2024 04/15/24   Yolande Lamar BROCKS, MD  lidocaine  (LIDODERM ) 5 % Place 1 patch onto the skin daily. Remove & Discard patch within 12 hours or as directed by MD Patient not taking: Reported on 08/26/2024 04/15/24   Yolande Lamar BROCKS, MD  meclizine  (ANTIVERT ) 25 MG tablet Take 1 tablet (25 mg total) by mouth 3 (three) times daily as needed for dizziness. Patient not taking: Reported on 08/26/2024 02/13/24       Allergies  Allergen Reactions   Penicillins Anaphylaxis    04/26/23- per patient this was a reaction when she was in the 7th grade per patient she experienced facial swelling  Patient tolerated 2 g cefazolin  IV for the surgery without any difficulties noted.  Franky HERO Supple MD    Rosuvastatin  Itching    Possible.     Social History   Socioeconomic History   Marital status: Widowed    Spouse name: Not on file   Number of children: 2  Years of education: Not on file   Highest education level: Not on file  Occupational History   Occupation: retired print production planner  Tobacco Use   Smoking status: Never   Smokeless tobacco: Never  Vaping Use   Vaping status: Never Used  Substance and Sexual Activity   Alcohol  use: No   Drug use: No   Sexual activity: Not on file  Other Topics Concern   Not on file  Social History Narrative   Not on file   Social Drivers of Health   Financial Resource Strain: Not on file  Food Insecurity: Low Risk  (09/01/2024)   Received from [facility]   Hunger Vital Sign    Within the past 12 months, you worried that your food would run out before you got money to buy more: Never true    Within the past 12 months, the food you bought just didn't last and you didn't have money to get more. : Never true  Transportation Needs: No Transportation Needs (09/01/2024)   Received from [facility]    In the past 12 months, has lack of reliable transportation kept you from medical appointments, meetings, work or from getting things needed for daily living? : No  Physical Activity: Not on file  Stress: Not on file  Social Connections: Not on file  Intimate Partner Violence: Not on file    Tobacco Use: Low Risk  (09/08/2024)   Patient History    Smoking Tobacco Use: Never    Smokeless Tobacco Use: Never    Passive Exposure: Not on file   Social History   Substance and Sexual Activity  Alcohol  Use No    Family History  Problem Relation Age of Onset   Lymphoma Mother 60   Bladder Cancer Sister 14   Prostate cancer Brother 59       has surgery right away after dx   Leukemia Brother        60's   Breast cancer Other 27       niece, GT 2017 reportedly neg   Prostate cancer Father 36       found on autopsy  at death (75)   Lung cancer Maternal Aunt    Esophageal cancer Maternal Uncle    Other Maternal Grandfather 10       cerebral hemmhorage   Lung cancer Cousin    Lung cancer Cousin    Leukemia Cousin    Allergic rhinitis Neg Hx    Angioedema Neg Hx    Asthma Neg Hx    Atopy Neg Hx    Eczema Neg Hx    Immunodeficiency Neg Hx    Urticaria Neg Hx     ROS   Objective:  Physical Exam: - Well-developed female, alert and oriented, no apparent distress.  - Left hip: Flex 110, rotated in 20, rotated out 30, abducted 40 with pain on range of motion.  - Exquisite tenderness over the greater trochanter, especially at the tip of the trochanter, with palpation reproducing pain.  Vital signs in last 24 hours: Weight:  [78 kg] 78 kg (11/24 0948)  Imaging Review MRI of the left hip from 04/19/24 demonstrates chronic degenerative changes in the gluteal tendons, including a full-thickness tear of the gluteus medius involving approximately half to two-thirds of the tendon with retraction. Fatty atrophy in the muscle suggests chronicity rather than an acute tear. 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  Edema and a small split in the piriformis tendon are observed, likely representing the acute issue. Degeneration in the hamstring is also noted, which may contribute to her pain.  Assessment/Plan:  ASSESSMENT: [name] has persistent left lateral hip pain and dysfunction secondary to trochanteric bursitis and a gluteus medius tendon tear. Despite prior injection therapy, her symptoms have not improved, and she now experiences worsening pain and instability. Surgical intervention, including left hip bursectomy and gluteal tendon repair, has been discussed in detail, and the patient has expressed her desire to proceed with operative treatment.  PLAN: The plan is to proceed with surgical treatment, including left hip bursectomy and gluteal tendon repair, on  an outpatient basis. The procedure and rehabilitation course, along with associated risks, have been thoroughly discussed with the patient. Postoperative rehabilitation has also been reviewed.     - Patient was instructed on what medications to stop prior to surgery. - Follow-up visit in 2 weeks with Dr. Aluisio - Prescriptions will be provided in hospital at time of discharge  Corean Sender, PA-C Orthopedic Surgery [facility] [place]

## 2024-09-09 ENCOUNTER — Encounter (HOSPITAL_COMMUNITY): Payer: Self-pay | Admitting: Orthopedic Surgery

## 2024-09-09 ENCOUNTER — Other Ambulatory Visit: Payer: Self-pay

## 2024-09-12 ENCOUNTER — Other Ambulatory Visit (HOSPITAL_COMMUNITY): Payer: Self-pay

## 2024-10-03 ENCOUNTER — Other Ambulatory Visit (HOSPITAL_COMMUNITY): Payer: Self-pay

## 2024-10-03 ENCOUNTER — Other Ambulatory Visit: Payer: Self-pay

## 2024-10-28 DIAGNOSIS — J189 Pneumonia, unspecified organism: Secondary | ICD-10-CM | POA: Insufficient documentation

## 2024-10-28 DIAGNOSIS — E039 Hypothyroidism, unspecified: Secondary | ICD-10-CM | POA: Insufficient documentation

## 2024-10-28 DIAGNOSIS — L509 Urticaria, unspecified: Secondary | ICD-10-CM | POA: Insufficient documentation

## 2024-10-28 DIAGNOSIS — M069 Rheumatoid arthritis, unspecified: Secondary | ICD-10-CM | POA: Insufficient documentation

## 2024-10-28 DIAGNOSIS — M1990 Unspecified osteoarthritis, unspecified site: Secondary | ICD-10-CM | POA: Insufficient documentation

## 2024-10-29 ENCOUNTER — Ambulatory Visit: Attending: Cardiology | Admitting: Cardiology

## 2024-10-29 ENCOUNTER — Encounter: Payer: Self-pay | Admitting: Cardiology

## 2024-10-29 VITALS — BP 140/80 | HR 75 | Ht 65.0 in | Wt 172.0 lb

## 2024-10-29 DIAGNOSIS — I272 Pulmonary hypertension, unspecified: Secondary | ICD-10-CM | POA: Diagnosis not present

## 2024-10-29 DIAGNOSIS — E1151 Type 2 diabetes mellitus with diabetic peripheral angiopathy without gangrene: Secondary | ICD-10-CM | POA: Diagnosis not present

## 2024-10-29 DIAGNOSIS — I2510 Atherosclerotic heart disease of native coronary artery without angina pectoris: Secondary | ICD-10-CM

## 2024-10-29 DIAGNOSIS — I951 Orthostatic hypotension: Secondary | ICD-10-CM | POA: Diagnosis not present

## 2024-10-29 NOTE — Progress Notes (Signed)
 " Cardiology Office Note:    Date:  10/29/2024   ID:  Jenkins LELON Sabin, DOB 01-17-1941, MRN [id number]  PCP:  Thurmond Cathlyn LABOR., MD  Cardiologist:  Lamar Fitch, MD    Referring MD: Thurmond Cathlyn LABOR., MD   Chief Complaint  Patient presents with   Follow-up  Cardiac wise doing well  History of Present Illness:    [name] is a 84 y.o. female past medical history significant for diabetes which was very well-controlled, dyslipidemia, essential hypertension no smoking she was referred to us  before right shoulder surgery, evaluation included stress test which was negative, also echocardiogram there was also some question about potential pulmonary hypertension but echocardiogram did not confirm that.  She went to surgery with no difficulties sadly surgery did not work as were expected she still get a lot of pain on top of the Problem with the left shoulder also left hip that required some surgery overall she is still try to be active she still trying to cook and have a garden but is severely limited now with her orthopedic issues  Past Medical History:  Diagnosis Date   Acquired hypothyroidism 12/13/2015   Last Assessment & Plan:   Relevant Hx:  Course:  Daily Update:  Today's Plan:update her TSH for her on her current dose of med     Electronically signed by: Eleanor Merlynn Lady, NP  01/11/16 [phone]   Acquired pes planus of right foot 08/23/2023   Acute diverticulitis 01/06/2022   Anemia due to antineoplastic chemotherapy 08/19/2019   Arthritis    Arthritis, rheumatoid (HCC)    Atherosclerosis 12/13/2015   Axillary lymphadenopathy 11/04/2018   Bilateral thumb pain 12/04/2022   Breast pain, right 11/04/2018   Bronchiectasis without complication (HCC) 07/08/2019   Bruising 09/22/2020   Change in nail appearance 02/03/2019   Formatting of this note might be different from the original. Recommend review with Onc and then Derm if not clear. Formatting of this note might be different from  the original. Recommend review with Onc and then Derm if not clear.   Chemotherapy adverse reaction 03/11/2018   Chronic back pain greater than 3 months duration 09/24/2018   Chronic idiopathic constipation 03/13/2018   Chronic left shoulder pain 01/11/2021   Chronic vertigo 02/13/2024   Closed fracture of fourth metatarsal bone of left foot 06/01/2023   Closed fracture of metatarsal bone 05/23/2023   Closed fracture of sesamoid bone of right foot 06/01/2023   Coronary artery calcification 11/28/2018   Formatting of this note might be different from the original. Seen on CT of chest 11/2018 Formatting of this note might be different from the original. 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) 12/13/2015   Last Assessment & Plan:   Relevant Hx:  Course:  Daily Update:  Today's Plan:we had a long discussion about med choices, she had been given onglyza samples when I saw her briefly in the lobby about 2 weeks ago to hold till she came back and did her labs, we discussed med choices with victoza  and byetta  which worked better for her though she could not afford them, discussed the newer meds farxiga a   Difficulty walking 08/23/2023   Dizziness 02/20/2024   Elevated liver function tests 02/06/2018   Essential hypertension 12/13/2015   Last Assessment & Plan:   Relevant Hx:  Course:  Daily Update:  Today's Plan:this is stable for  her and will continue to follow this for her     Electronically signed by: Eleanor Merlynn Lady, NP  01/11/16 [id number]   Genetic testing 06/03/2018   TumorNext HRD + CancerNext germline testing was ordered.     The CancerNext gene panel offered by [employer] includes sequencing and rearrangement analysis for the following 32 genes:   APC, ATM, BARD1, BMPR1A, BRCA1, BRCA2, BRIP1, CDH1, CDK4, CDKN2A, CHEK2, DICER1, EPCAM, GREM1, HOXB13MLH1, MRE11A, MSH2, MSH6, MUTYH,  NBN, NF1, PALB2, PMS2, POLD1, POLE, PTEN, RAD50, RAD51D, SMAD4, SMARCA4, STK   GERD without esophagitis 12/13/2015   Goals of care, counseling/discussion 11/26/2018   High risk medication use 12/13/2015   Hypothyroidism    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   Intractable pain    Left lower quadrant abdominal pain 01/02/2022   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   Low back pain 03/07/2023   Lumbar disc herniation 02/09/2021   Lumbosacral radiculopathy    Major depressive disorder, recurrent 12/13/2015   Malaise and fatigue 12/13/2015   Last Assessment & Plan:   Relevant Hx:  Course:  Daily Update:  Today's Plan:she is walking about 4-5 days per week and going to the gym and has gotten up to speed 4 on a level 3 , she feels her energy drain though is more sugar related and that is bothersome to her     Electronically signed by: Eleanor Merlynn Lady, NP  01/11/16 0913   Malignant neoplasm of left ovary (HCC)    Mixed hyperlipidemia 12/13/2015   Last Assessment & Plan:   Relevant Hx:  Course:  Daily Update:  Today's Plan:update her lipids for her today      Electronically signed by: Eleanor Merlynn Lady, NP  01/11/16 [phone]   Multiple lung nodules on CT 07/25/2018   Mycobacteria, 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 Probably related to to crestor.  Avoid statins. Formatting of this note might be different from the original. Probably related to to crestor.  Avoid statins.   Orthostatic hypotension 02/20/2024   Osteoarthritis of knee 12/04/2022   Other constipation 03/01/2018   Other  fatigue 11/11/2018   Ovarian cancer (HCC) 01/31/2018   Pain in joint of left knee 10/01/2023   Pancytopenia, acquired (HCC) 10/25/2018   Pes planus of right foot 04/03/2023   Severe/arthritis.     Physical debility 07/25/2018   Pneumonia    its been 4 years    Post-menopause 02/21/2016   Formatting of this note might be different from the original. Nl dexa 07/2020. Repeat 2025. Formatting of this note might be different from the original. Nl dexa 07/2020. Repeat 2025.   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 Seen on most recent CT scan done by oncology 11/2018 with enlarged pulmonary artery Formatting of this note might be different from the original. 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   Trochanteric bursitis of left hip 09/08/2024   Unspecified disorder of synovium and tendon, unspecified lower leg 07/26/2023   Urticaria    UTI (urinary tract infection) 05/13/2019   Vitamin B12 deficiency 12/13/2015   Weight loss 04/22/2020    Past Surgical History:  Procedure Laterality Date   BLADDER SUSPENSION  2002   BREAST SURGERY     several benign cysts removed; surgeries from 1975- 1990    CHOLECYSTECTOMY  1988   DECOMPRESSIVE LUMBAR LAMINECTOMY LEVEL 1 N/A 02/09/2021   Procedure: DECOMPRESSIVE LUMBAR LAMINECTOMY LEVEL 1 lumbar three-four;  Surgeon: Burnetta Aures, MD;  Location: [facility] OR;  Service: Orthopedics;  Laterality: N/A;   EYE SURGERY  2010 amd 2012   cataracts removed bilateral    GLUTEUS MINIMUS REPAIR Left 09/08/2024    Procedure: REPAIR, TENDON, GLUTEUS MINIMUS;  Surgeon: Melodi Lerner, MD;  Location: [facility];  Service: Orthopedics;  Laterality: Left;  Left hip bursectomy and gluteal tendon repair   IR FLUORO GUIDE PORT INSERTION RIGHT  02/28/2018   IR REMOVAL TUN ACCESS W/ PORT W/O FL MOD SED  03/28/2022   IR US  GUIDE VASC ACCESS RIGHT  02/28/2018   KNEE ARTHROSCOPY Left    l knee x2   KYPHOPLASTY N/A 02/09/2021   Procedure: KYPHOPLASTY lumbar four;  Surgeon: Burnetta Aures, MD;  Location: [facility] OR;  Service: Orthopedics;  Laterality: N/A;   PARATHYROIDECTOMY  2010   REVERSE SHOULDER ARTHROPLASTY Right 04/26/2023   Procedure: REVERSE SHOULDER ARTHROPLASTY;  Surgeon: Melita Drivers, MD;  Location: [facility];  Service: Orthopedics;  Laterality: Right;    ROBOTIC ASSISTED SALPINGO OOPHERECTOMY Bilateral 01/31/2018   Procedure: XI ROBOTIC ASSISTED BILATERAL SALPINGO OOPHORECTOMY, STAGING, LAPAROTOMY, PELVIC AND PARA AORTIC LYMPH NODE DISSECTION, OMENTECTOMY;  Surgeon: Anitra Freddy NOVAK, MD;  Location: [facility];  Service: Gynecology;  Laterality: Bilateral;   ROTATOR CUFF REPAIR  2005   right    TOTAL KNEE ARTHROPLASTY Left 12/08/2014   Procedure: LEFT TOTAL KNEE ARTHROPLASTY;  Surgeon: Tanda DELENA Heading, MD;  Location: [facility];  Service: Orthopedics;  Laterality: Left;   VAGINAL HYSTERECTOMY  1984    Current Medications: Active Medications[1]   Allergies:   Penicillins and Rosuvastatin   Social History   Socioeconomic History   Marital status: Widowed    Spouse name: Not on file   Number of children: 2   Years of education: Not on file   Highest education level: Not on file  Occupational History   Occupation: retired print production planner  Tobacco Use   Smoking status: Never   Smokeless tobacco: Never  Vaping Use   Vaping status: Never Used  Substance and Sexual Activity   Alcohol use: No   Drug use: No   Sexual activity: Not on file  Other Topics Concern   Not on file  Social History Narrative   Not on  file   Social Drivers of Health   Tobacco Use: Low Risk (10/29/2024)   Patient History    Smoking Tobacco Use: Never    Smokeless Tobacco Use: Never    Passive Exposure: Not on file  Financial Resource Strain: Not on file  Food Insecurity: Low Risk (09/01/2024)   Received from [facility]   Epic    Within the past 12 months, you worried that your  food would run out before you got money to buy more: Never true    Within the past 12 months, the food you bought just didn't last and you didn't have money to get more. : Never true  Transportation Needs: No Transportation Needs (09/01/2024)   Received from [facility]    In the past 12 months, has lack of reliable transportation kept you from medical appointments, meetings, work or from getting things needed for daily living? : No  Physical Activity: Not on file  Stress: Not on file  Social Connections: Not on file  Depression (EYV7-0): Not on file  Alcohol Screen: Not on file  Housing: Low Risk (09/01/2024)   Received from [facility]   Epic    What is your living situation today?: I have a steady place to live    Think about the place you live. Do you have problems with any of the following? Choose all that apply:: None/None on this list  Utilities: Low Risk (09/01/2024)   Received from [facility]   Utilities    In the past 12 months has the electric, gas, oil, or water  company threatened to shut off services in your home? : No  Health Literacy: Not on file     Family History: The patient's family history includes Bladder Cancer (age of onset: 37) in her sister; Breast cancer (age of onset: 32) in an other family member; Esophageal cancer in her maternal uncle; Leukemia in her brother and cousin; Lung cancer in her cousin, cousin, and maternal aunt; Lymphoma (age of onset: 71) in her mother; Other (age of onset: 39) in her maternal grandfather; Prostate cancer (age of onset: 73) in her brother; Prostate  cancer (age of onset: 70) in her father. There is no history of Allergic rhinitis, Angioedema, Asthma, Atopy, Eczema, Immunodeficiency, or Urticaria. ROS:   Please see the history of present illness.    All 14 point review of systems negative except as described per history of present illness  EKGs/Labs/Other Studies Reviewed:         Recent Labs: 01/22/2024: TSH 3.254 07/24/2024: ALT 15 08/28/2024: BUN 26; Creatinine, Ser 0.86; Hemoglobin 13.9; Platelets 250; Potassium 4.6; Sodium 140  Recent Lipid Panel No results found for: CHOL, TRIG, HDL, CHOLHDL, VLDL, LDLCALC, LDLDIRECT  Physical Exam:    VS:  BP (!) 140/80   Pulse 75   Ht 5' 5 (1.651 m)   Wt 172 lb (78 kg)   SpO2 97%   BMI 28.62 kg/m     Wt Readings from Last 3 Encounters:  10/29/24 172 lb (78 kg)  09/08/24 172 lb (78 kg)  08/28/24 172 lb (78 kg)     GEN:  Well nourished, well developed in no acute distress HEENT: Normal NECK: No JVD; No carotid bruits LYMPHATICS: No lymphadenopathy CARDIAC: RRR, no murmurs, no rubs, no gallops RESPIRATORY:  Clear to auscultation without rales, wheezing or rhonchi  ABDOMEN: Soft, non-tender, non-distended MUSCULOSKELETAL:  No edema; No deformity  SKIN: Warm and dry LOWER EXTREMITIES: no swelling NEUROLOGIC:  Alert and oriented x 3 PSYCHIATRIC:  Normal affect   ASSESSMENT:    1. Coronary artery calcification   2. 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)   3. Pulmonary hypertension (HCC)   4. Orthostatic hypotension    PLAN:    In order of problems listed above:  Coronary calcification stress test negative asymptomatic no chest pain tightness squeezing pressure burning chest will continue risk modification which include  antiplatelets therapy. Dyslipidemia she is taking Lipitor 10 which I will continue.  I do not have the recent fasting lipid profile.  Will call primary care physician to get a copy of it. Pulmonary hypertension given  trepidation repeated echocardiogram did not confirm that. Orthostatic hypotension denies having any   Medication Adjustments/Labs and Tests Ordered: Current medicines are reviewed at length with the patient today.  Concerns regarding medicines are outlined above.  No orders of the defined types were placed in this encounter.  Medication changes: No orders of the defined types were placed in this encounter.   Signed, Lamar DOROTHA Fitch, MD, [facility] 10/29/2024 3:19 PM    [facility]    [1]  Current Meds  Medication Sig   ALPRAZolam  (XANAX ) 0.5 MG tablet Take 0.5 mg by mouth at bedtime.   amLODipine  (NORVASC ) 5 MG tablet Take 5 mg by mouth daily.   atorvastatin  (LIPITOR) 10 MG tablet Take 10 mg by mouth daily.   cyanocobalamin (VITAMIN B12) 1000 MCG/ML injection Inject 1,000 mcg into the muscle every 30 (thirty) days.   FLUoxetine  (PROZAC ) 20 MG capsule Take 1 capsule (20 mg total) by mouth daily.   hydrochlorothiazide  (HYDRODIURIL ) 25 MG tablet Take 25 mg by mouth daily.   levothyroxine  (SYNTHROID ) 100 MCG tablet Take 100 mcg by mouth daily.   LINZESS  145 MCG CAPS capsule Take 1 capsule (145 mcg total) by mouth daily.   losartan  (COZAAR ) 100 MG tablet Take 100 mg by mouth daily.   Magnesium  400 MG TABS Take 400 mg by mouth daily.   Melatonin 10 MG TABS Take 10 mg by mouth at bedtime.   metFORMIN  (GLUCOPHAGE -XR) 500 MG 24 hr tablet Take 500 mg by mouth 2 (two) times daily.   methocarbamol  (ROBAXIN ) 500 MG tablet Take 1 tablet (500 mg total) by mouth every 6 (six) hours as needed for muscle spasms.   "

## 2024-10-29 NOTE — Patient Instructions (Signed)
